# Patient Record
Sex: Female | Born: 1940 | Race: White | Hispanic: No | Smoking: Current some day smoker
Health system: Southern US, Community
[De-identification: ages and names within clinical notes are randomized; demographics above are authoritative.]

## PROBLEM LIST (undated history)

## (undated) DIAGNOSIS — C801 Malignant (primary) neoplasm, unspecified: Secondary | ICD-10-CM

## (undated) DIAGNOSIS — Z87891 Personal history of nicotine dependence: Secondary | ICD-10-CM

## (undated) DIAGNOSIS — L089 Local infection of the skin and subcutaneous tissue, unspecified: Secondary | ICD-10-CM

## (undated) DIAGNOSIS — C50919 Malignant neoplasm of unspecified site of unspecified female breast: Secondary | ICD-10-CM

## (undated) DIAGNOSIS — N289 Disorder of kidney and ureter, unspecified: Secondary | ICD-10-CM

## (undated) DIAGNOSIS — I10 Essential (primary) hypertension: Secondary | ICD-10-CM

## (undated) DIAGNOSIS — I639 Cerebral infarction, unspecified: Secondary | ICD-10-CM

## (undated) DIAGNOSIS — I6529 Occlusion and stenosis of unspecified carotid artery: Secondary | ICD-10-CM

## (undated) HISTORY — DX: Occlusion and stenosis of unspecified carotid artery: I65.29

## (undated) HISTORY — PX: BREAST LUMPECTOMY: SHX2

## (undated) HISTORY — DX: Cerebral infarction, unspecified: I63.9

## (undated) HISTORY — DX: Local infection of the skin and subcutaneous tissue, unspecified: L08.9

## (undated) HISTORY — PX: BREAST SURGERY: SHX581

## (undated) HISTORY — DX: Malignant (primary) neoplasm, unspecified: C80.1

## (undated) HISTORY — PX: BLADDER REPAIR: SHX76

## (undated) HISTORY — DX: Malignant neoplasm of unspecified site of unspecified female breast: C50.919

## (undated) HISTORY — DX: Disorder of kidney and ureter, unspecified: N28.9

## (undated) HISTORY — PX: ABDOMINAL HYSTERECTOMY: SHX81

## (undated) HISTORY — DX: Essential (primary) hypertension: I10

## (undated) HISTORY — DX: Personal history of nicotine dependence: Z87.891

## (undated) NOTE — *Deleted (*Deleted)
Transition of Care [facility]) - Progression Note    Patient Details  Name: Cathy Solis MRN: [id number] Date of Birth: Aug 14, 1941  Transition of Care [facility]) CM/SW Contact  Maree Krabbe, LCSW Phone Number: 10/01/2020, 12:55 PM  Clinical Narrative:       Expected Discharge Plan: Skilled Nursing Facility Barriers to Discharge: Continued Medical Work up  Expected Discharge Plan and Services Expected Discharge Plan: Skilled Nursing Facility       Living arrangements for the past 2 months: Single Family Home                                       Social Determinants of Health (SDOH) Interventions    Readmission Risk Interventions No flowsheet data found.

---
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
## 2004-10-07 ENCOUNTER — Ambulatory Visit: Payer: Self-pay | Admitting: Internal Medicine

## 2004-11-21 DIAGNOSIS — C50919 Malignant neoplasm of unspecified site of unspecified female breast: Secondary | ICD-10-CM

## 2004-11-21 DIAGNOSIS — C801 Malignant (primary) neoplasm, unspecified: Secondary | ICD-10-CM

## 2004-11-21 HISTORY — DX: Malignant neoplasm of unspecified site of unspecified female breast: C50.919

## 2004-11-21 HISTORY — DX: Malignant (primary) neoplasm, unspecified: C80.1

## 2005-08-18 ENCOUNTER — Ambulatory Visit: Payer: Self-pay | Admitting: Internal Medicine

## 2005-08-25 ENCOUNTER — Ambulatory Visit: Payer: Self-pay | Admitting: Internal Medicine

## 2005-09-16 ENCOUNTER — Ambulatory Visit: Payer: Self-pay | Admitting: Internal Medicine

## 2005-09-20 ENCOUNTER — Other Ambulatory Visit: Payer: Self-pay

## 2005-09-26 ENCOUNTER — Ambulatory Visit: Payer: Self-pay | Admitting: Surgery

## 2005-10-05 ENCOUNTER — Ambulatory Visit: Payer: Self-pay | Admitting: Urology

## 2005-10-05 ENCOUNTER — Ambulatory Visit: Payer: Self-pay | Admitting: Surgery

## 2005-10-10 ENCOUNTER — Ambulatory Visit: Payer: Self-pay | Admitting: Urology

## 2005-10-12 ENCOUNTER — Ambulatory Visit: Payer: Self-pay | Admitting: Oncology

## 2005-10-27 ENCOUNTER — Ambulatory Visit: Payer: Self-pay | Admitting: Surgery

## 2005-11-22 ENCOUNTER — Ambulatory Visit: Payer: Self-pay | Admitting: Oncology

## 2005-12-22 ENCOUNTER — Ambulatory Visit: Payer: Self-pay | Admitting: Oncology

## 2006-01-19 ENCOUNTER — Ambulatory Visit: Payer: Self-pay | Admitting: Oncology

## 2006-02-19 ENCOUNTER — Ambulatory Visit: Payer: Self-pay | Admitting: Oncology

## 2006-05-15 ENCOUNTER — Ambulatory Visit: Payer: Self-pay | Admitting: Oncology

## 2006-05-29 ENCOUNTER — Ambulatory Visit: Payer: Self-pay | Admitting: Oncology

## 2006-09-11 ENCOUNTER — Ambulatory Visit: Payer: Self-pay | Admitting: Oncology

## 2006-09-25 ENCOUNTER — Ambulatory Visit: Payer: Self-pay | Admitting: Gastroenterology

## 2006-09-27 ENCOUNTER — Ambulatory Visit: Payer: Self-pay | Admitting: Oncology

## 2006-10-21 ENCOUNTER — Ambulatory Visit: Payer: Self-pay | Admitting: Oncology

## 2006-10-31 ENCOUNTER — Ambulatory Visit: Payer: Self-pay | Admitting: *Deleted

## 2006-11-17 ENCOUNTER — Other Ambulatory Visit: Payer: Self-pay

## 2006-11-21 HISTORY — PX: CAROTID ENDARTERECTOMY: SUR193

## 2006-11-21 HISTORY — PX: COLONOSCOPY: SHX174

## 2006-11-29 ENCOUNTER — Inpatient Hospital Stay: Payer: Self-pay | Admitting: General Surgery

## 2006-12-24 ENCOUNTER — Emergency Department: Payer: Self-pay | Admitting: Emergency Medicine

## 2007-01-20 ENCOUNTER — Ambulatory Visit: Payer: Self-pay | Admitting: Oncology

## 2007-02-05 ENCOUNTER — Ambulatory Visit: Payer: Self-pay | Admitting: Oncology

## 2007-02-20 ENCOUNTER — Ambulatory Visit: Payer: Self-pay | Admitting: Oncology

## 2007-05-22 ENCOUNTER — Ambulatory Visit: Payer: Self-pay | Admitting: Oncology

## 2007-06-06 ENCOUNTER — Ambulatory Visit: Payer: Self-pay | Admitting: Oncology

## 2007-06-22 ENCOUNTER — Ambulatory Visit: Payer: Self-pay | Admitting: Oncology

## 2007-07-31 ENCOUNTER — Emergency Department: Payer: Self-pay | Admitting: Emergency Medicine

## 2007-09-14 ENCOUNTER — Ambulatory Visit: Payer: Self-pay | Admitting: Oncology

## 2007-11-22 ENCOUNTER — Ambulatory Visit: Payer: Self-pay | Admitting: Oncology

## 2007-12-06 ENCOUNTER — Ambulatory Visit: Payer: Self-pay | Admitting: Oncology

## 2007-12-23 ENCOUNTER — Ambulatory Visit: Payer: Self-pay | Admitting: Oncology

## 2008-05-21 ENCOUNTER — Ambulatory Visit: Payer: Self-pay | Admitting: Oncology

## 2008-06-05 ENCOUNTER — Ambulatory Visit: Payer: Self-pay | Admitting: Oncology

## 2008-06-21 ENCOUNTER — Ambulatory Visit: Payer: Self-pay | Admitting: Oncology

## 2008-10-28 ENCOUNTER — Ambulatory Visit: Payer: Self-pay | Admitting: Oncology

## 2008-11-21 ENCOUNTER — Ambulatory Visit: Payer: Self-pay | Admitting: Oncology

## 2008-12-04 ENCOUNTER — Ambulatory Visit: Payer: Self-pay | Admitting: Oncology

## 2008-12-22 ENCOUNTER — Ambulatory Visit: Payer: Self-pay | Admitting: Oncology

## 2009-05-21 ENCOUNTER — Ambulatory Visit: Payer: Self-pay | Admitting: Oncology

## 2009-06-18 ENCOUNTER — Ambulatory Visit: Payer: Self-pay | Admitting: Oncology

## 2009-06-21 ENCOUNTER — Ambulatory Visit: Payer: Self-pay | Admitting: Oncology

## 2009-07-22 ENCOUNTER — Ambulatory Visit: Payer: Self-pay | Admitting: Oncology

## 2009-07-23 ENCOUNTER — Ambulatory Visit: Payer: Self-pay | Admitting: Internal Medicine

## 2009-12-22 ENCOUNTER — Ambulatory Visit: Payer: Self-pay | Admitting: Oncology

## 2010-01-14 ENCOUNTER — Ambulatory Visit: Payer: Self-pay | Admitting: Oncology

## 2010-01-19 ENCOUNTER — Ambulatory Visit: Payer: Self-pay | Admitting: Oncology

## 2010-02-11 ENCOUNTER — Ambulatory Visit: Payer: Self-pay | Admitting: Oncology

## 2010-06-21 ENCOUNTER — Ambulatory Visit: Payer: Self-pay | Admitting: Oncology

## 2010-07-14 ENCOUNTER — Ambulatory Visit: Payer: Self-pay | Admitting: Oncology

## 2010-07-15 LAB — CANCER ANTIGEN 27.29: CA 27.29: 29.4 U/mL (ref 0.0–38.6)

## 2010-07-22 ENCOUNTER — Ambulatory Visit: Payer: Self-pay | Admitting: Oncology

## 2010-09-14 ENCOUNTER — Ambulatory Visit: Payer: Self-pay | Admitting: Nephrology

## 2010-11-02 LAB — HM MAMMOGRAPHY: HM Mammogram: NORMAL

## 2011-02-15 ENCOUNTER — Ambulatory Visit: Payer: Self-pay | Admitting: Oncology

## 2011-07-28 ENCOUNTER — Other Ambulatory Visit (INDEPENDENT_AMBULATORY_CARE_PROVIDER_SITE_OTHER): Payer: PRIVATE HEALTH INSURANCE | Admitting: *Deleted

## 2011-07-28 ENCOUNTER — Encounter: Payer: Self-pay | Admitting: Internal Medicine

## 2011-07-28 DIAGNOSIS — Z0000 Encounter for general adult medical examination without abnormal findings: Secondary | ICD-10-CM

## 2011-07-28 LAB — CBC WITH DIFFERENTIAL/PLATELET
Basophils Absolute: 0 10*3/uL (ref 0.0–0.1)
Basophils Relative: 0.5 % (ref 0.0–3.0)
Eosinophils Absolute: 0.1 10*3/uL (ref 0.0–0.7)
Eosinophils Relative: 2 % (ref 0.0–5.0)
HCT: 42.3 % (ref 36.0–46.0)
Hemoglobin: 14.2 g/dL (ref 12.0–15.0)
Lymphocytes Relative: 24.1 % (ref 12.0–46.0)
Lymphs Abs: 1.6 10*3/uL (ref 0.7–4.0)
MCHC: 33.6 g/dL (ref 30.0–36.0)
MCV: 97.4 fl (ref 78.0–100.0)
Monocytes Absolute: 0.7 10*3/uL (ref 0.1–1.0)
Monocytes Relative: 11.1 % (ref 3.0–12.0)
Neutro Abs: 4.1 10*3/uL (ref 1.4–7.7)
Neutrophils Relative %: 62.3 % (ref 43.0–77.0)
Platelets: 215 10*3/uL (ref 150.0–400.0)
RBC: 4.34 Mil/uL (ref 3.87–5.11)
RDW: 13.3 % (ref 11.5–14.6)
WBC: 6.5 10*3/uL (ref 4.5–10.5)

## 2011-07-28 LAB — LIPID PANEL
Cholesterol: 165 mg/dL (ref 0–200)
HDL: 57.4 mg/dL (ref >39.00)
LDL Cholesterol: 86 mg/dL (ref 0–99)
Total CHOL/HDL Ratio: 3
Triglycerides: 110 mg/dL (ref 0.0–149.0)
VLDL: 22 mg/dL (ref 0.0–40.0)

## 2011-07-28 LAB — BASIC METABOLIC PANEL
BUN: 13 mg/dL (ref 6–23)
CO2: 26 mEq/L (ref 19–32)
Calcium: 9.2 mg/dL (ref 8.4–10.5)
Chloride: 103 mEq/L (ref 96–112)
Creatinine, Ser: 0.8 mg/dL (ref 0.4–1.2)
GFR: 78.18 mL/min (ref >60.00)
Glucose, Bld: 89 mg/dL (ref 70–99)
Potassium: 3.7 mEq/L (ref 3.5–5.1)
Sodium: 137 mEq/L (ref 135–145)

## 2011-07-28 LAB — HEPATIC FUNCTION PANEL
ALT: 15 U/L (ref 0–35)
AST: 21 U/L (ref 0–37)
Albumin: 3.9 g/dL (ref 3.5–5.2)
Alkaline Phosphatase: 60 U/L (ref 39–117)
Bilirubin, Direct: 0.1 mg/dL (ref 0.0–0.3)
Total Bilirubin: 0.5 mg/dL (ref 0.3–1.2)
Total Protein: 7.7 g/dL (ref 6.0–8.3)

## 2011-08-04 ENCOUNTER — Ambulatory Visit (INDEPENDENT_AMBULATORY_CARE_PROVIDER_SITE_OTHER): Payer: PRIVATE HEALTH INSURANCE | Admitting: Internal Medicine

## 2011-08-04 ENCOUNTER — Encounter: Payer: Self-pay | Admitting: Internal Medicine

## 2011-08-04 VITALS — BP 148/78 | HR 68 | Temp 97.8°F | Resp 24 | Ht 65.5 in | Wt 131.2 lb

## 2011-08-04 DIAGNOSIS — R531 Weakness: Secondary | ICD-10-CM

## 2011-08-04 DIAGNOSIS — I129 Hypertensive chronic kidney disease with stage 1 through stage 4 chronic kidney disease, or unspecified chronic kidney disease: Secondary | ICD-10-CM | POA: Insufficient documentation

## 2011-08-04 DIAGNOSIS — R5383 Other fatigue: Secondary | ICD-10-CM

## 2011-08-04 DIAGNOSIS — E039 Hypothyroidism, unspecified: Secondary | ICD-10-CM | POA: Insufficient documentation

## 2011-08-04 DIAGNOSIS — I10 Essential (primary) hypertension: Secondary | ICD-10-CM

## 2011-08-04 DIAGNOSIS — R5381 Other malaise: Secondary | ICD-10-CM

## 2011-08-04 LAB — TSH: TSH: 0.52 u[IU]/mL (ref 0.35–5.50)

## 2011-08-04 LAB — VITAMIN B12: Vitamin B-12: 362 pg/mL (ref 211–911)

## 2011-08-04 LAB — MICROALBUMIN / CREATININE URINE RATIO
Creatinine,U: 30.1 mg/dL
Microalb Creat Ratio: 68 mg/g — ABNORMAL HIGH (ref 0.0–30.0)
Microalb, Ur: 20.5 mg/dL — ABNORMAL HIGH (ref 0.0–1.9)

## 2011-08-04 NOTE — Patient Instructions (Addendum)
Labs today. Follow up in 1 month.  Smoking Cessation This document explains the best ways for you to quit smoking and new treatments to help. It lists new medicines that can double or triple your chances of quitting and quitting for good. It also considers ways to avoid relapses and concerns you may have about quitting, including weight gain. NICOTINE: A POWERFUL ADDICTION If you have tried to quit smoking, you know how hard it can be. It is hard because nicotine is a very addictive drug. For some people, it can be as addictive as heroin or cocaine. Usually, people make 2 or 3 tries, or more, before finally being able to quit. Each time you try to quit, you can learn about what helps and what hurts. Quitting takes hard work and a lot of effort, but you can quit smoking. QUITTING SMOKING IS ONE OF THE MOST IMPORTANT THINGS YOU WILL EVER DO:  You will live longer, feel better, and live better.   The impact on your body of quitting smoking is felt almost immediately:   Within 20 minutes, blood pressure decreases. Pulse returns to its normal level.   After 8 hours, carbon monoxide levels in the blood return to normal. Oxygen level increases.   After 24 hours, chance of heart attack starts to decrease. Breath, hair, and body stop smelling like smoke.   After 48 hours, damaged nerve endings begin to recover. Sense of taste and smell improve.   After 72 hours, the body is virtually free of nicotine. Bronchial tubes relax and breathing becomes easier.   After 2 to 12 weeks, lungs can hold more air. Exercise becomes easier and circulation improves.   Quitting will lower your chance of having a heart attack, stroke, cancer, or lung disease:   After 1 year, the risk of coronary heart disease is cut in half.   After 5 years, the risk of stroke falls to the same as a nonsmoker.   After 10 years, the risk of lung cancer is cut in half and the risk of other cancers decreases significantly.   After  15 years, the risk of coronary heart disease drops, usually to the level of a nonsmoker.   If you are pregnant, quitting smoking will improve your chances of having a healthy baby.   The people you live with, especially your children, will be healthier.   You will have extra money to spend on things other than cigarettes.  FIVE KEYS TO QUITTING Studies have shown that these 5 steps will help you quit smoking and quit for good. You have the best chances of quitting if you use them together: 1. Get ready.  2. Get support and encouragement.  3. Learn new skills and behaviors.  4. Get medicine to reduce your nicotine addiction and use it correctly.  5. Be prepared for relapse or difficult situations. Be determined to continue trying to quit, even if you do not succeed at first.  1. GET READY  Set a quit date.   Change your environment.   Get rid of ALL cigarettes, ashtrays, matches, and lighters in your home, car, and place of work.   Do not let people smoke in your home.   Review your past attempts to quit. Think about what worked and what did not.   Once you quit, do not smoke. NOT EVEN A PUFF!  2. GET SUPPORT AND ENCOURAGEMENT Studies have shown that you have a better chance of being successful if you have help. You  can get support in many ways.  Tell your family, friends, and coworkers that you are going to quit and need their support. Ask them not to smoke around you.   Talk to your caregivers (doctor, dentist, nurse, pharmacist, psychologist, and/or smoking counselor).   Get individual, group, or telephone counseling and support. The more counseling you have, the better your chances are of quitting. Programs are available at [employer] and health centers. Call your local health department for information about programs in your area.   Spiritual beliefs and practices may help some smokers quit.   Quit meters are Photographer that keep  track of quit statistics, such as amount of "quit-time," cigarettes not smoked, and money saved.   Many smokers find one or more of the many self-help books available useful in helping them quit and stay off tobacco.  3. LEARN NEW SKILLS AND BEHAVIORS  Try to distract yourself from urges to smoke. Talk to someone, go for a walk, or occupy your time with a task.   When you first try to quit, change your routine. Take a different route to work. Drink tea instead of coffee. Eat breakfast in a different place.   Do something to reduce your stress. Take a hot bath, exercise, or read a book.   Plan something enjoyable to do every day. Reward yourself for not smoking.   Explore interactive web-based programs that specialize in helping you quit.  4. GET MEDICINE AND USE IT CORRECTLY Medicines can help you stop smoking and decrease the urge to smoke. Combining medicine with the above behavioral methods and support can quadruple your chances of successfully quitting smoking. The [place] Food and Drug Administration (FDA) has approved 7 medicines to help you quit smoking. These medicines fall into 3 categories.  Nicotine replacement therapy (delivers nicotine to your body without the negative effects and risks of smoking):   Nicotine gum: Available over-the-counter.   Nicotine lozenges: Available over-the-counter.   Nicotine inhaler: Available by prescription.   Nicotine nasal spray: Available by prescription.   Nicotine skin patches (transdermal): Available by prescription and over-the-counter.   Antidepressant medicine (helps people abstain from smoking, but how this works is unknown):   Bupropion sustained-release (SR) tablets: Available by prescription.   Nicotinic receptor partial agonist (simulates the effect of nicotine in your brain):   Varenicline tartrate tablets: Available by prescription.   Ask your caregiver for advice about which medicines to use and how to use them. Carefully  read the information on the package.   Everyone who is trying to quit may benefit from using a medicine. If you are pregnant or trying to become pregnant, nursing an infant, you are under age 52, or you smoke fewer than 10 cigarettes per day, talk to your caregiver before taking any nicotine replacement medicines.   You should stop using a nicotine replacement product and call your caregiver if you experience nausea, dizziness, weakness, vomiting, fast or irregular heartbeat, mouth problems with the lozenge or gum, or redness or swelling of the skin around the patch that does not go away.   Do not use any other product containing nicotine while using a nicotine replacement product.   Talk to your caregiver before using these products if you have diabetes, heart disease, asthma, stomach ulcers, you had a recent heart attack, you have high blood pressure that is not controlled with medicine, a history of irregular heartbeat, or you have been prescribed medicine to help you quit  smoking.  5. BE PREPARED FOR RELAPSE OR DIFFICULT SITUATIONS  Most relapses occur within the first 3 months after quitting. Do not be discouraged if you start smoking again. Remember, most people try several times before they finally quit.   You may have symptoms of withdrawal because your body is used to nicotine. You may crave cigarettes, be irritable, feel very hungry, cough often, get headaches, or have difficulty concentrating.   The withdrawal symptoms are only temporary. They are strongest when you first quit, but they will go away within 10 to 14 days.  Here are some difficult situations to watch for:  Alcohol. Avoid drinking alcohol. Drinking lowers your chances of successfully quitting.   Caffeine. Try to reduce the amount of caffeine you consume. It also lowers your chances of successfully quitting.   Other smokers. Being around smoking can make you want to smoke. Avoid smokers.   Weight gain. Many smokers  will gain weight when they quit, usually less than 10 pounds. Eat a healthy diet and stay active. Do not let weight gain distract you from your main goal, quitting smoking. Some medicines that help you quit smoking may also help delay weight gain. You can always lose the weight gained after you quit.   Bad mood or depression. There are a lot of ways to improve your mood other than smoking.  If you are having problems with any of these situations, talk to your caregiver. SPECIAL SITUATIONS OR CONDITIONS Studies suggest that everyone can quit smoking. Your situation or condition can give you a special reason to quit.  Pregnant women/New mothers: By quitting, you protect your baby's health and your own.   Hospitalized patients: By quitting, you reduce health problems and help healing.   Heart attack patients: By quitting, you reduce your risk of a second heart attack.   Lung, head, and neck cancer patients: By quitting, you reduce your chance of a second cancer.   Parents of children and adolescents: By quitting, you protect your children from illnesses caused by secondhand smoke.  QUESTIONS TO THINK ABOUT Think about the following questions before you try to stop smoking. You may want to talk about your answers with your caregiver.  Why do you want to quit?   If you tried to quit in the past, what helped and what did not?   What will be the most difficult situations for you after you quit? How will you plan to handle them?   Who can help you through the tough times? Your family? Friends? Caregiver?   What pleasures do you get from smoking? What ways can you still get pleasure if you quit?  Here are some questions to ask your caregiver:  How can you help me to be successful at quitting?   What medicine do you think would be best for me and how should I take it?   What should I do if I need more help?   What is smoking withdrawal like? How can I get information on withdrawal?    Quitting takes hard work and a lot of effort, but you can quit smoking. FOR MORE INFORMATION Smokefree.gov (http://www.davis-sullivan.com/) provides free, accurate, evidence-based information and professional assistance to help support the immediate and long-term needs of people trying to quit smoking. Document Released: 11/01/2001 Document Re-Released: 04/27/2010 [facility] Patient Information 2011 [place], [place].

## 2011-08-04 NOTE — Progress Notes (Signed)
Subjective:    Patient ID: Cathy Solis, female    DOB: 01-Apr-1941, 70 y.o.   MRN: [id number]  HPI Cathy Solis is a 69 year old female with a history of hypertension and hypothyroidism who presents for followup. She complains of significant fatigue and diffuse weakness over the last several months. She denies any focal symptoms. She denies any nausea, vomiting, diarrhea, blood in stool, pain, change in appetite, fever. She denies any chest pain, palpitations, shortness of breath. She continues to work at the [employer] and often has to lift boxes which are near 50 pounds. She has some difficulty in performing her job duties because of her diffuse weakness. She has not had any falls. She has not noted any weight loss. She reports well rounded diet.  Outpatient Encounter Prescriptions as of 08/04/2011  Medication Sig Dispense Refill  . AmLODIPine Besylate (NORVASC PO) Take 1 tablet by mouth daily.        [name] aspirin 325 MG tablet Take 325 mg by mouth daily.        . Calcium Carbonate (CALCIUM 600 PO) Take 1 tablet by mouth daily.        [name] levothyroxine (SYNTHROID, LEVOTHROID) 125 MCG tablet Take 125 mcg by mouth daily.        . metoprolol tartrate (LOPRESSOR) 25 MG tablet Take 25 mg by mouth 2 (two) times daily.        . Rosuvastatin Calcium (CRESTOR PO) Take 1 tablet by mouth daily.          Review of Systems  Constitutional: Positive for fatigue. Negative for fever, chills, appetite change and unexpected weight change.  HENT: Negative for ear pain, congestion, sore throat, trouble swallowing, neck pain, voice change and sinus pressure.   Eyes: Negative for visual disturbance.  Respiratory: Negative for cough, shortness of breath, wheezing and stridor.   Cardiovascular: Negative for chest pain, palpitations and leg swelling.  Gastrointestinal: Negative for nausea, vomiting, abdominal pain, diarrhea, constipation, blood in stool, abdominal distention and anal bleeding.  Genitourinary:  Negative for dysuria and flank pain.  Musculoskeletal: Negative for myalgias, arthralgias and gait problem.  Skin: Negative for color change and rash.  Neurological: Positive for weakness. Negative for dizziness, tremors, seizures, facial asymmetry, light-headedness, numbness and headaches.  Hematological: Negative for adenopathy. Does not bruise/bleed easily.  Psychiatric/Behavioral: Negative for suicidal ideas, sleep disturbance and dysphoric mood. The patient is not nervous/anxious.    BP 148/78  Pulse 68  Temp(Src) 97.8 F (36.6 C) (Oral)  Resp 24  Ht 5' 5.5" (1.664 m)  Wt 131 lb 4 oz (59.535 kg)  BMI 21.51 kg/m2  SpO2 97%     Objective:   Physical Exam  Constitutional: She is oriented to person, place, and time. She appears well-developed and well-nourished. No distress.  HENT:  Head: Normocephalic and atraumatic.  Right Ear: External ear normal.  Left Ear: External ear normal.  Nose: Nose normal.  Mouth/Throat: Oropharynx is clear and moist. No oropharyngeal exudate.  Eyes: Conjunctivae are normal. Pupils are equal, round, and reactive to light. Right eye exhibits no discharge. Left eye exhibits no discharge. No scleral icterus.  Neck: Normal range of motion. Neck supple. No tracheal deviation present. No thyromegaly present.  Cardiovascular: Normal rate, regular rhythm, normal heart sounds and intact distal pulses.  Exam reveals no gallop and no friction rub.   No murmur heard. Pulmonary/Chest: Effort normal and breath sounds normal. No respiratory distress. She has no wheezes. She has no rales. She exhibits no  tenderness. Right breast exhibits no inverted nipple, no mass, no nipple discharge, no skin change and no tenderness. Left breast exhibits no inverted nipple, no mass, no nipple discharge, no skin change and no tenderness. Breasts are symmetrical.  Abdominal: Soft. Normal appearance and bowel sounds are normal. There is no hepatosplenomegaly. There is no tenderness.    Musculoskeletal: Normal range of motion. She exhibits no edema and no tenderness.  Lymphadenopathy:    She has no cervical adenopathy.  Neurological: She is alert and oriented to person, place, and time. No cranial nerve deficit. She exhibits normal muscle tone. Coordination normal.  Skin: Skin is warm and dry. No rash noted. She is not diaphoretic. No erythema. No pallor.  Psychiatric: She has a normal mood and affect. Her behavior is normal. Judgment and thought content normal.          Assessment & Plan:  1. Fatigue and weakness - patient with a recent history of fatigue and weakness. She has no focal symptoms and exam is normal today. 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 We will check for thyroid dysfunction and B12 deficiency with lab work today. Her other recent lab work including blood counts, renal function, liver function were all normal. She is up-to-date on her colonoscopy. She is up-to-date on her mammogram. We will call her with lab reports and plan to see her back in one month.  2. Hypothyroidism - Will check TSH with labs today. Continue Synthroid.  3. Hypertension - Renal function normal on recent check. SBP slightly elevated today. Will continue current meds and recheck BP in 1 month.

## 2011-08-31 ENCOUNTER — Encounter: Payer: Self-pay | Admitting: Internal Medicine

## 2011-09-22 ENCOUNTER — Other Ambulatory Visit: Payer: Self-pay | Admitting: Internal Medicine

## 2011-09-22 MED ORDER — ROSUVASTATIN CALCIUM 10 MG PO TABS: 10.0000 mg | ORAL_TABLET | Freq: Every day | ORAL | Status: DC

## 2011-09-27 ENCOUNTER — Other Ambulatory Visit: Payer: Self-pay | Admitting: *Deleted

## 2011-09-27 MED ORDER — LEVOTHYROXINE SODIUM 125 MCG PO TABS: 125.0000 ug | ORAL_TABLET | Freq: Every day | ORAL | Status: DC

## 2011-09-27 MED ORDER — METOPROLOL TARTRATE 25 MG PO TABS: 25.0000 mg | ORAL_TABLET | Freq: Two times a day (BID) | ORAL | Status: DC

## 2011-10-21 ENCOUNTER — Telehealth: Payer: Self-pay | Admitting: Internal Medicine

## 2011-10-21 MED ORDER — AMLODIPINE BESYLATE 10 MG PO TABS: 10.0000 mg | ORAL_TABLET | Freq: Every day | ORAL | Status: DC

## 2011-10-21 NOTE — Telephone Encounter (Signed)
Pt needs RF, done, Patient informed

## 2011-11-22 HISTORY — PX: EYE SURGERY: SHX253

## 2011-12-21 ENCOUNTER — Ambulatory Visit: Payer: Self-pay | Admitting: Ophthalmology

## 2011-12-26 ENCOUNTER — Other Ambulatory Visit: Payer: PRIVATE HEALTH INSURANCE

## 2011-12-29 ENCOUNTER — Ambulatory Visit (INDEPENDENT_AMBULATORY_CARE_PROVIDER_SITE_OTHER): Payer: PRIVATE HEALTH INSURANCE | Admitting: Internal Medicine

## 2011-12-29 ENCOUNTER — Encounter: Payer: Self-pay | Admitting: Internal Medicine

## 2011-12-29 VITALS — BP 110/70 | HR 61 | Temp 98.1°F | Ht 64.5 in | Wt 133.0 lb

## 2011-12-29 DIAGNOSIS — R809 Proteinuria, unspecified: Secondary | ICD-10-CM

## 2011-12-29 DIAGNOSIS — I10 Essential (primary) hypertension: Secondary | ICD-10-CM

## 2011-12-29 DIAGNOSIS — E785 Hyperlipidemia, unspecified: Secondary | ICD-10-CM | POA: Insufficient documentation

## 2011-12-29 DIAGNOSIS — E039 Hypothyroidism, unspecified: Secondary | ICD-10-CM

## 2011-12-29 DIAGNOSIS — Z1231 Encounter for screening mammogram for malignant neoplasm of breast: Secondary | ICD-10-CM

## 2011-12-29 LAB — COMPREHENSIVE METABOLIC PANEL
ALT: 19 U/L (ref 0–35)
AST: 23 U/L (ref 0–37)
Albumin: 3.6 g/dL (ref 3.5–5.2)
Alkaline Phosphatase: 65 U/L (ref 39–117)
BUN: 24 mg/dL — ABNORMAL HIGH (ref 6–23)
CO2: 28 mEq/L (ref 19–32)
Calcium: 9.5 mg/dL (ref 8.4–10.5)
Chloride: 104 mEq/L (ref 96–112)
Creatinine, Ser: 0.9 mg/dL (ref 0.4–1.2)
GFR: 62.03 mL/min (ref >60.00)
Glucose, Bld: 87 mg/dL (ref 70–99)
Potassium: 3.9 mEq/L (ref 3.5–5.1)
Sodium: 137 mEq/L (ref 135–145)
Total Bilirubin: 0.5 mg/dL (ref 0.3–1.2)
Total Protein: 7.2 g/dL (ref 6.0–8.3)

## 2011-12-29 LAB — TSH: TSH: 0.9 u[IU]/mL (ref 0.35–5.50)

## 2011-12-29 LAB — MICROALBUMIN / CREATININE URINE RATIO
Creatinine,U: 60.7 mg/dL
Microalb Creat Ratio: 48.3 mg/g — ABNORMAL HIGH (ref 0.0–30.0)
Microalb, Ur: 29.3 mg/dL — ABNORMAL HIGH (ref 0.0–1.9)

## 2011-12-29 MED ORDER — AMLODIPINE BESYLATE 10 MG PO TABS: 10.0000 mg | ORAL_TABLET | Freq: Every day | ORAL | Status: DC

## 2011-12-29 MED ORDER — ROSUVASTATIN CALCIUM 10 MG PO TABS: 10.0000 mg | ORAL_TABLET | Freq: Every day | ORAL | Status: DC

## 2011-12-29 MED ORDER — LEVOTHYROXINE SODIUM 125 MCG PO TABS: 125.0000 ug | ORAL_TABLET | Freq: Every day | ORAL | Status: DC

## 2011-12-29 MED ORDER — METOPROLOL TARTRATE 25 MG PO TABS: 25.0000 mg | ORAL_TABLET | Freq: Two times a day (BID) | ORAL | Status: DC

## 2011-12-29 NOTE — Assessment & Plan Note (Signed)
Patient noted to have microalbuminuria on recent labs. Will repeat today.

## 2011-12-29 NOTE — Progress Notes (Signed)
Subjective:    Patient ID: Cathy Solis, female    DOB: [birth year], 71 y.o.   MRN: [id number]  HPI 71 year old female with history of hypertension, hyperlipidemia, and hypothyroidism presents for followup. At her last visit, she was concerned about some fatigue. She notes that this has completely resolved. In regards to her hypertension, she reports good control of her blood pressure. She reports full compliance with her medication. She denies any headache, palpitations, chest pain. In regards to her hyperlipidemia, she reports full compliance with her Crestor. She denies any myalgia. In regards to hypothyroidism, she denies any fatigue,  temperature intolerance, or other complaints. She reports that she is generally feeling well.  Outpatient Encounter Prescriptions as of 12/29/2011  Medication Sig Dispense Refill  . amLODipine (NORVASC) 10 MG tablet Take 1 tablet (10 mg total) by mouth daily.  90 tablet  4  . aspirin 325 MG tablet Take 325 mg by mouth daily.        . Calcium Carbonate (CALCIUM 600 PO) Take 1 tablet by mouth daily.        [name] doxycycline (VIBRAMYCIN) 100 MG capsule Take 100 mg by mouth 2 (two) times daily.      [name] levothyroxine (SYNTHROID, LEVOTHROID) 125 MCG tablet Take 1 tablet (125 mcg total) by mouth daily.  90 tablet  4  . metoprolol tartrate (LOPRESSOR) 25 MG tablet Take 1 tablet (25 mg total) by mouth 2 (two) times daily.  90 tablet  4  . rosuvastatin (CRESTOR) 10 MG tablet Take 1 tablet (10 mg total) by mouth daily.  90 tablet  4    Review of Systems  Constitutional: Negative for fever, chills, appetite change, fatigue and unexpected weight change.  HENT: Negative for ear pain, congestion, sore throat, trouble swallowing, neck pain, voice change and sinus pressure.   Eyes: Negative for visual disturbance.  Respiratory: Negative for cough, shortness of breath, wheezing and stridor.   Cardiovascular: Negative for chest pain, palpitations and leg swelling.  Gastrointestinal:  Negative for nausea, vomiting, abdominal pain, diarrhea, constipation, blood in stool, abdominal distention and anal bleeding.  Genitourinary: Negative for dysuria and flank pain.  Musculoskeletal: Negative for myalgias, arthralgias and gait problem.  Skin: Negative for color change and rash.  Neurological: Negative for dizziness and headaches.  Hematological: Negative for adenopathy. Does not bruise/bleed easily.  Psychiatric/Behavioral: Negative for suicidal ideas, sleep disturbance and dysphoric mood. The patient is not nervous/anxious.    BP 110/70  Pulse 61  Temp(Src) 98.1 F (36.7 C) (Oral)  Ht 5' 4.5" (1.638 m)  Wt 133 lb (60.328 kg)  BMI 22.48 kg/m2  SpO2 96%     Objective:   Physical Exam  Constitutional: She is oriented to person, place, and time. She appears well-developed and well-nourished. No distress.  HENT:  Head: Normocephalic and atraumatic.  Right Ear: External ear normal.  Left Ear: External ear normal.  Nose: Nose normal.  Mouth/Throat: Oropharynx is clear and moist. No oropharyngeal exudate.  Eyes: Conjunctivae are normal. Pupils are equal, round, and reactive to light. Right eye exhibits no discharge. Left eye exhibits no discharge. No scleral icterus.  Neck: Normal range of motion. Neck supple. No tracheal deviation present. No thyromegaly present.  Cardiovascular: Normal rate, regular rhythm, normal heart sounds and intact distal pulses.  Exam reveals no gallop and no friction rub.   No murmur heard. Pulmonary/Chest: Effort normal and breath sounds normal. No respiratory distress. She has no wheezes. She has no rales. She exhibits no tenderness.  Musculoskeletal: Normal range of motion. She exhibits no edema and no tenderness.  Lymphadenopathy:    She has no cervical adenopathy.  Neurological: She is alert and oriented to person, place, and time. No cranial nerve deficit. She exhibits normal muscle tone. Coordination normal.  Skin: Skin is warm and dry. No  rash noted. She is not diaphoretic. No erythema. No pallor.     Psychiatric: She has a normal mood and affect. Her behavior is normal. Judgment and thought content normal.          Assessment & Plan:

## 2011-12-29 NOTE — Assessment & Plan Note (Signed)
Patient with hyperlipidemia, on Crestor. Will check liver function tests with labs today. Followup in 6 months.

## 2011-12-29 NOTE — Assessment & Plan Note (Signed)
Blood pressure well-controlled on current medications. Proteinuria noted on last labs, will repeat today. If persistent, will start ACE inhibitor. Followup in 6 months.

## 2011-12-29 NOTE — Assessment & Plan Note (Signed)
Symptoms stable on Synthroid. Will check TSH with labs. Followup in 6 months.

## 2012-02-16 ENCOUNTER — Ambulatory Visit: Payer: Self-pay | Admitting: Internal Medicine

## 2012-02-24 ENCOUNTER — Encounter: Payer: Self-pay | Admitting: Internal Medicine

## 2012-04-02 ENCOUNTER — Ambulatory Visit: Payer: Self-pay | Admitting: Gastroenterology

## 2012-04-03 LAB — PATHOLOGY REPORT

## 2012-04-17 ENCOUNTER — Other Ambulatory Visit: Payer: Self-pay | Admitting: *Deleted

## 2012-04-17 DIAGNOSIS — I10 Essential (primary) hypertension: Secondary | ICD-10-CM

## 2012-04-17 MED ORDER — AMLODIPINE BESYLATE 10 MG PO TABS: 10.0000 mg | ORAL_TABLET | Freq: Every day | ORAL | Status: DC

## 2012-04-17 NOTE — Telephone Encounter (Signed)
Per pharmacy, never received Printed Rx script from 02.07.13 OV-gave VO for refill #[id number]

## 2012-04-18 ENCOUNTER — Encounter: Payer: Self-pay | Admitting: Internal Medicine

## 2012-05-17 ENCOUNTER — Other Ambulatory Visit: Payer: Self-pay | Admitting: *Deleted

## 2012-05-17 DIAGNOSIS — E785 Hyperlipidemia, unspecified: Secondary | ICD-10-CM

## 2012-05-17 DIAGNOSIS — I10 Essential (primary) hypertension: Secondary | ICD-10-CM

## 2012-05-17 MED ORDER — METOPROLOL TARTRATE 25 MG PO TABS: 25.0000 mg | ORAL_TABLET | Freq: Two times a day (BID) | ORAL | Status: DC

## 2012-05-17 MED ORDER — ROSUVASTATIN CALCIUM 10 MG PO TABS: 10.0000 mg | ORAL_TABLET | Freq: Every day | ORAL | Status: DC

## 2012-05-17 NOTE — Telephone Encounter (Signed)
Received fax refill request for Synthroid , this dose is not listed on med list please advise.

## 2012-05-17 NOTE — Telephone Encounter (Signed)
Fine to refill. Please just confirm with patient that she is taking this.

## 2012-05-17 NOTE — Telephone Encounter (Signed)
Left message on machine at home for patient to return call. 

## 2012-05-18 ENCOUNTER — Other Ambulatory Visit: Payer: Self-pay | Admitting: *Deleted

## 2012-05-18 MED ORDER — LEVOTHYROXINE SODIUM 175 MCG PO TABS: 175.0000 ug | ORAL_TABLET | Freq: Every day | ORAL | Status: DC

## 2012-05-18 NOTE — Telephone Encounter (Signed)
Spoke with patient via telephone and she stated that she doesn't know what dose of Synthroid she is on, she is at work and doesn't have the medication bottle with her.  I advised patient that we must verify what correct dose she is taking before we refill it.  She will call back later with dose of Synthroid.

## 2012-06-26 ENCOUNTER — Encounter: Payer: Self-pay | Admitting: Internal Medicine

## 2012-06-26 ENCOUNTER — Ambulatory Visit (INDEPENDENT_AMBULATORY_CARE_PROVIDER_SITE_OTHER): Payer: BC Managed Care – PPO | Admitting: Internal Medicine

## 2012-06-26 VITALS — BP 142/80 | HR 60 | Temp 98.0°F | Ht 64.5 in | Wt 132.5 lb

## 2012-06-26 DIAGNOSIS — R531 Weakness: Secondary | ICD-10-CM | POA: Insufficient documentation

## 2012-06-26 DIAGNOSIS — R809 Proteinuria, unspecified: Secondary | ICD-10-CM

## 2012-06-26 DIAGNOSIS — R5381 Other malaise: Secondary | ICD-10-CM

## 2012-06-26 DIAGNOSIS — E039 Hypothyroidism, unspecified: Secondary | ICD-10-CM

## 2012-06-26 DIAGNOSIS — D510 Vitamin B12 deficiency anemia due to intrinsic factor deficiency: Secondary | ICD-10-CM

## 2012-06-26 DIAGNOSIS — E785 Hyperlipidemia, unspecified: Secondary | ICD-10-CM

## 2012-06-26 DIAGNOSIS — R5383 Other fatigue: Secondary | ICD-10-CM

## 2012-06-26 DIAGNOSIS — E538 Deficiency of other specified B group vitamins: Secondary | ICD-10-CM

## 2012-06-26 DIAGNOSIS — I10 Essential (primary) hypertension: Secondary | ICD-10-CM

## 2012-06-26 DIAGNOSIS — Z23 Encounter for immunization: Secondary | ICD-10-CM

## 2012-06-26 LAB — COMPREHENSIVE METABOLIC PANEL
ALT: 16 U/L (ref 0–35)
AST: 21 U/L (ref 0–37)
Albumin: 3.7 g/dL (ref 3.5–5.2)
Alkaline Phosphatase: 60 U/L (ref 39–117)
BUN: 18 mg/dL (ref 6–23)
CO2: 27 mEq/L (ref 19–32)
Calcium: 9.2 mg/dL (ref 8.4–10.5)
Chloride: 100 mEq/L (ref 96–112)
Creatinine, Ser: 0.9 mg/dL (ref 0.4–1.2)
GFR: 62.72 mL/min (ref >60.00)
Glucose, Bld: 91 mg/dL (ref 70–99)
Potassium: 3.7 mEq/L (ref 3.5–5.1)
Sodium: 135 mEq/L (ref 135–145)
Total Bilirubin: 0.4 mg/dL (ref 0.3–1.2)
Total Protein: 7.3 g/dL (ref 6.0–8.3)

## 2012-06-26 LAB — CBC WITH DIFFERENTIAL/PLATELET
Basophils Absolute: 0.1 10*3/uL (ref 0.0–0.1)
Basophils Relative: 1 % (ref 0.0–3.0)
Eosinophils Absolute: 0.1 10*3/uL (ref 0.0–0.7)
Eosinophils Relative: 1.9 % (ref 0.0–5.0)
HCT: 42.1 % (ref 36.0–46.0)
Hemoglobin: 14.1 g/dL (ref 12.0–15.0)
Lymphocytes Relative: 25.8 % (ref 12.0–46.0)
Lymphs Abs: 1.8 10*3/uL (ref 0.7–4.0)
MCHC: 33.6 g/dL (ref 30.0–36.0)
MCV: 95.9 fl (ref 78.0–100.0)
Monocytes Absolute: 0.8 10*3/uL (ref 0.1–1.0)
Monocytes Relative: 10.9 % (ref 3.0–12.0)
Neutro Abs: 4.2 10*3/uL (ref 1.4–7.7)
Neutrophils Relative %: 60.4 % (ref 43.0–77.0)
Platelets: 214 10*3/uL (ref 150.0–400.0)
RBC: 4.39 Mil/uL (ref 3.87–5.11)
RDW: 14.3 % (ref 11.5–14.6)
WBC: 6.9 10*3/uL (ref 4.5–10.5)

## 2012-06-26 LAB — MICROALBUMIN / CREATININE URINE RATIO
Creatinine,U: 37.8 mg/dL
Microalb Creat Ratio: 111.8 mg/g — ABNORMAL HIGH (ref 0.0–30.0)
Microalb, Ur: 42.3 mg/dL — ABNORMAL HIGH (ref 0.0–1.9)

## 2012-06-26 LAB — LDL CHOLESTEROL, DIRECT: Direct LDL: 122.6 mg/dL

## 2012-06-26 LAB — VITAMIN B12: Vitamin B-12: 218 pg/mL (ref 211–911)

## 2012-06-26 LAB — LIPID PANEL
Cholesterol: 212 mg/dL — ABNORMAL HIGH (ref 0–200)
HDL: 66.6 mg/dL (ref >39.00)
Total CHOL/HDL Ratio: 3
Triglycerides: 125 mg/dL (ref 0.0–149.0)
VLDL: 25 mg/dL (ref 0.0–40.0)

## 2012-06-26 LAB — CK: Total CK: 105 U/L (ref 7–177)

## 2012-06-26 LAB — TSH: TSH: 1.66 u[IU]/mL (ref 0.35–5.50)

## 2012-06-26 LAB — T4, FREE: Free T4: 1.41 ng/dL (ref 0.60–1.60)

## 2012-06-26 MED ORDER — LEVOTHYROXINE SODIUM 175 MCG PO TABS: 175.0000 ug | ORAL_TABLET | Freq: Every day | ORAL | Status: DC

## 2012-06-26 MED ORDER — ROSUVASTATIN CALCIUM 10 MG PO TABS: 10.0000 mg | ORAL_TABLET | Freq: Every day | ORAL | Status: DC

## 2012-06-26 MED ORDER — CYANOCOBALAMIN 1000 MCG/ML IJ SOLN
1000.0000 ug | Freq: Once | INTRAMUSCULAR | Status: AC
  Administered 2012-06-26: 1000 ug via INTRAMUSCULAR

## 2012-06-26 MED ORDER — METOPROLOL TARTRATE 25 MG PO TABS: 25.0000 mg | ORAL_TABLET | Freq: Two times a day (BID) | ORAL | Status: DC

## 2012-06-26 MED ORDER — AMLODIPINE BESYLATE 10 MG PO TABS: 10.0000 mg | ORAL_TABLET | Freq: Every day | ORAL | Status: DC

## 2012-06-26 NOTE — Assessment & Plan Note (Signed)
B12 slightly low in past. Will recheck today and give B12 given ongoing weakness.

## 2012-06-26 NOTE — Assessment & Plan Note (Signed)
Generalized weakness. Exam normal, no focal deficits noted. Will check CBC, CMP, B12, TSH with labs. Will also check CK as pt on Crestor. Follow up 6 months and prn.

## 2012-06-26 NOTE — Assessment & Plan Note (Signed)
Will check lipids with labs today. Continue Crestor. Will also check CK level given ongoing issues with fatigue and weakness.

## 2012-06-26 NOTE — Progress Notes (Signed)
Subjective:    Patient ID: Cathy Solis, female    DOB: 1941/08/05, 71 y.o.   MRN: [id number]  HPI 71 year old female with history of hypertension, hypothyroidism, hyperlipidemia presents for followup. She reports some generalized weakness. She is very active, managing a large farm. However, she reports over the last several months she has become fatigued more quickly. She denies any focal weakness. She denies any muscle pain. She denies any change in appetite or weight. She denies any change in bowel habits. She reports full compliance with her medications.  Outpatient Encounter Prescriptions as of 06/26/2012  Medication Sig Dispense Refill  . amLODipine (NORVASC) 10 MG tablet Take 1 tablet (10 mg total) by mouth daily.  90 tablet  3  . aspirin 325 MG tablet Take 325 mg by mouth daily.        [name] levothyroxine (SYNTHROID, LEVOTHROID) 175 MCG tablet Take 1 tablet (175 mcg total) by mouth daily.  90 tablet  3  . metoprolol tartrate (LOPRESSOR) 25 MG tablet Take 1 tablet (25 mg total) by mouth 2 (two) times daily.  90 tablet  3  . rosuvastatin (CRESTOR) 10 MG tablet Take 1 tablet (10 mg total) by mouth daily.  90 tablet  3  . Calcium Carbonate (CALCIUM 600 PO) Take 1 tablet by mouth daily.         Facility-Administered Encounter Medications as of 06/26/2012  Medication Dose Route Frequency Provider Last Rate Last Dose  . cyanocobalamin ((VITAMIN B-12)) injection 1,000 mcg  1,000 mcg Intramuscular Once Shelia Media, MD   1,000 mcg at 06/26/12 0854   BP 142/80  Pulse 60  Temp 98 F (36.7 C) (Oral)  Ht 5' 4.5" (1.638 m)  Wt 132 lb 8 oz (60.102 kg)  BMI 22.39 kg/m2  SpO2 97%  Review of Systems  Constitutional: Positive for fatigue. Negative for fever, chills, appetite change and unexpected weight change.  HENT: Negative for ear pain, congestion, sore throat, trouble swallowing, neck pain, voice change and sinus pressure.   Eyes: Negative for visual disturbance.  Respiratory: Negative for  cough, shortness of breath, wheezing and stridor.   Cardiovascular: Negative for chest pain, palpitations and leg swelling.  Gastrointestinal: Negative for nausea, vomiting, abdominal pain, diarrhea, constipation, blood in stool, abdominal distention and anal bleeding.  Genitourinary: Negative for dysuria and flank pain.  Musculoskeletal: Negative for myalgias, arthralgias and gait problem.  Skin: Negative for color change and rash.  Neurological: Positive for weakness. Negative for dizziness and headaches.  Hematological: Negative for adenopathy. Does not bruise/bleed easily.  Psychiatric/Behavioral: Negative for suicidal ideas, disturbed wake/sleep cycle and dysphoric mood. The patient is not nervous/anxious.        Objective:   Physical Exam  Constitutional: She is oriented to person, place, and time. She appears well-developed and well-nourished. No distress.  HENT:  Head: Normocephalic and atraumatic.  Right Ear: External ear normal.  Left Ear: External ear normal.  Nose: Nose normal.  Mouth/Throat: Oropharynx is clear and moist. No oropharyngeal exudate.  Eyes: Conjunctivae are normal. Pupils are equal, round, and reactive to light. Right eye exhibits no discharge. Left eye exhibits no discharge. No scleral icterus.  Neck: Normal range of motion. Neck supple. No tracheal deviation present. No thyromegaly present.  Cardiovascular: Normal rate, regular rhythm, normal heart sounds and intact distal pulses.  Exam reveals no gallop and no friction rub.   No murmur heard. Pulmonary/Chest: Effort normal and breath sounds normal. No respiratory distress. She has no wheezes. She has no  rales. She exhibits no tenderness.  Musculoskeletal: Normal range of motion. She exhibits no edema and no tenderness.  Lymphadenopathy:    She has no cervical adenopathy.  Neurological: She is alert and oriented to person, place, and time. No cranial nerve deficit. She exhibits normal muscle tone.  Coordination normal.  Skin: Skin is warm and dry. No rash noted. She is not diaphoretic. No erythema. No pallor.  Psychiatric: She has a normal mood and affect. Her behavior is normal. Judgment and thought content normal.          Assessment & Plan:

## 2012-06-26 NOTE — Assessment & Plan Note (Signed)
Check TSH with labs today. Continue Synthroid.

## 2012-06-26 NOTE — Assessment & Plan Note (Signed)
Will repeat urine microalbumin today.

## 2012-06-26 NOTE — Assessment & Plan Note (Signed)
Blood pressure fairly well-controlled on current medications. Will check renal function with labs today. Continue amlodipine and metoprolol.

## 2012-06-29 ENCOUNTER — Telehealth: Payer: Self-pay | Admitting: *Deleted

## 2012-06-29 NOTE — Telephone Encounter (Signed)
Received PA approval for Crestor, pharmacy notified and PA form sent to be scanned.

## 2012-07-03 ENCOUNTER — Telehealth: Payer: Self-pay | Admitting: *Deleted

## 2012-07-03 ENCOUNTER — Other Ambulatory Visit (INDEPENDENT_AMBULATORY_CARE_PROVIDER_SITE_OTHER): Payer: BC Managed Care – PPO | Admitting: *Deleted

## 2012-07-03 DIAGNOSIS — E538 Deficiency of other specified B group vitamins: Secondary | ICD-10-CM

## 2012-07-03 DIAGNOSIS — R809 Proteinuria, unspecified: Secondary | ICD-10-CM

## 2012-07-03 DIAGNOSIS — Z79899 Other long term (current) drug therapy: Secondary | ICD-10-CM

## 2012-07-03 LAB — BASIC METABOLIC PANEL
BUN: 21 mg/dL (ref 6–23)
CO2: 25 mEq/L (ref 19–32)
Calcium: 9.2 mg/dL (ref 8.4–10.5)
Chloride: 101 mEq/L (ref 96–112)
Creatinine, Ser: 0.8 mg/dL (ref 0.4–1.2)
GFR: 73.55 mL/min (ref >60.00)
Glucose, Bld: 94 mg/dL (ref 70–99)
Potassium: 3.5 mEq/L (ref 3.5–5.1)
Sodium: 136 mEq/L (ref 135–145)

## 2012-07-03 MED ORDER — CYANOCOBALAMIN 1000 MCG/ML IJ SOLN
1000.0000 ug | Freq: Once | INTRAMUSCULAR | Status: AC
  Administered 2012-07-03: 1000 ug via INTRAMUSCULAR

## 2012-07-03 MED ORDER — LISINOPRIL 5 MG PO TABS: 5.0000 mg | ORAL_TABLET | Freq: Every day | ORAL | Status: DC

## 2012-07-03 NOTE — Telephone Encounter (Signed)
Yes, the lisinopril is necessary. However, we need to check a BMP after 1 week of taking the medicine. What is the referral to urology for?

## 2012-07-03 NOTE — Telephone Encounter (Signed)
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

## 2012-07-03 NOTE — Telephone Encounter (Signed)
Left message on machine at home for patient to return call. 

## 2012-07-03 NOTE — Telephone Encounter (Signed)
Patient came in for labs this morning for a bmp, she wast to have this drawn after taking the lisinopril for one week, but after I drew the labs she told me that she has not started the medication yet. She is asking if this is really necessary. She says that she doesn't want to just keep adding medications and stated that she is already taking 2 blood pressure medications. Also she is asking for a referral to urology. Please advise.

## 2012-07-03 NOTE — Telephone Encounter (Signed)
We should probably put her in with a nephrologist. This would make more sense as they monitor kidney function. This would be Dr. Cherylann Ratel.

## 2012-07-03 NOTE — Telephone Encounter (Signed)
Patient notified. She was advise that she will hear from our office regarding referral.

## 2012-07-10 ENCOUNTER — Ambulatory Visit: Payer: BC Managed Care – PPO

## 2012-07-10 ENCOUNTER — Other Ambulatory Visit: Payer: BC Managed Care – PPO

## 2012-10-08 ENCOUNTER — Other Ambulatory Visit: Payer: Self-pay | Admitting: *Deleted

## 2012-10-08 DIAGNOSIS — E039 Hypothyroidism, unspecified: Secondary | ICD-10-CM

## 2012-10-08 MED ORDER — LEVOTHYROXINE SODIUM 175 MCG PO TABS: 175.0000 ug | ORAL_TABLET | Freq: Every day | ORAL | Status: DC

## 2012-10-08 NOTE — Telephone Encounter (Signed)
Reordered synthroid 175 mcg per epic for patient

## 2012-11-07 ENCOUNTER — Other Ambulatory Visit: Payer: Self-pay | Admitting: Internal Medicine

## 2012-11-07 MED ORDER — LISINOPRIL 5 MG PO TABS: 5.0000 mg | ORAL_TABLET | Freq: Every day | ORAL | Status: DC

## 2012-11-07 NOTE — Telephone Encounter (Signed)
Medication filled.  

## 2012-11-07 NOTE — Telephone Encounter (Signed)
Lisinopril 5 mg tab  Take 1 tablet by mouth each day  # 30

## 2012-12-11 ENCOUNTER — Other Ambulatory Visit: Payer: Self-pay | Admitting: Internal Medicine

## 2012-12-11 DIAGNOSIS — I10 Essential (primary) hypertension: Secondary | ICD-10-CM

## 2012-12-11 MED ORDER — METOPROLOL TARTRATE 25 MG PO TABS: 25.0000 mg | ORAL_TABLET | Freq: Two times a day (BID) | ORAL | Status: DC

## 2012-12-11 NOTE — Telephone Encounter (Signed)
metoprolol tartrate (LOPRESSOR) 25 MG tablet  #90

## 2012-12-11 NOTE — Telephone Encounter (Signed)
rx sent to pharmacy by e-script  

## 2013-02-14 ENCOUNTER — Encounter: Payer: Self-pay | Admitting: Internal Medicine

## 2013-02-14 ENCOUNTER — Ambulatory Visit (INDEPENDENT_AMBULATORY_CARE_PROVIDER_SITE_OTHER): Payer: BC Managed Care – PPO | Admitting: Internal Medicine

## 2013-02-14 VITALS — BP 134/82 | HR 60 | Temp 98.0°F | Wt 134.0 lb

## 2013-02-14 DIAGNOSIS — I10 Essential (primary) hypertension: Secondary | ICD-10-CM

## 2013-02-14 DIAGNOSIS — R5383 Other fatigue: Secondary | ICD-10-CM

## 2013-02-14 DIAGNOSIS — R531 Weakness: Secondary | ICD-10-CM

## 2013-02-14 DIAGNOSIS — D510 Vitamin B12 deficiency anemia due to intrinsic factor deficiency: Secondary | ICD-10-CM

## 2013-02-14 DIAGNOSIS — R809 Proteinuria, unspecified: Secondary | ICD-10-CM

## 2013-02-14 DIAGNOSIS — E538 Deficiency of other specified B group vitamins: Secondary | ICD-10-CM

## 2013-02-14 DIAGNOSIS — E785 Hyperlipidemia, unspecified: Secondary | ICD-10-CM

## 2013-02-14 DIAGNOSIS — E039 Hypothyroidism, unspecified: Secondary | ICD-10-CM

## 2013-02-14 DIAGNOSIS — N390 Urinary tract infection, site not specified: Secondary | ICD-10-CM

## 2013-02-14 DIAGNOSIS — R5381 Other malaise: Secondary | ICD-10-CM

## 2013-02-14 LAB — COMPREHENSIVE METABOLIC PANEL
ALT: 15 U/L (ref 0–35)
AST: 20 U/L (ref 0–37)
Albumin: 3.8 g/dL (ref 3.5–5.2)
Alkaline Phosphatase: 71 U/L (ref 39–117)
BUN: 20 mg/dL (ref 6–23)
CO2: 27 mEq/L (ref 19–32)
Calcium: 9.2 mg/dL (ref 8.4–10.5)
Chloride: 100 mEq/L (ref 96–112)
Creatinine, Ser: 0.9 mg/dL (ref 0.4–1.2)
GFR: 66.73 mL/min (ref >60.00)
Glucose, Bld: 95 mg/dL (ref 70–99)
Potassium: 4.2 mEq/L (ref 3.5–5.1)
Sodium: 136 mEq/L (ref 135–145)
Total Bilirubin: 0.4 mg/dL (ref 0.3–1.2)
Total Protein: 7.6 g/dL (ref 6.0–8.3)

## 2013-02-14 LAB — POCT URINALYSIS DIPSTICK
Bilirubin, UA: NEGATIVE
Glucose, UA: NEGATIVE
Ketones, UA: NEGATIVE
Nitrite, UA: NEGATIVE
Protein, UA: 100
Spec Grav, UA: 1.015
Urobilinogen, UA: 0.2
pH, UA: 7

## 2013-02-14 LAB — MICROALBUMIN / CREATININE URINE RATIO
Creatinine,U: 34.3 mg/dL
Microalb Creat Ratio: 115.5 mg/g — ABNORMAL HIGH (ref 0.0–30.0)
Microalb, Ur: 39.6 mg/dL — ABNORMAL HIGH (ref 0.0–1.9)

## 2013-02-14 MED ORDER — CYANOCOBALAMIN 1000 MCG/ML IJ SOLN
1000.0000 ug | Freq: Once | INTRAMUSCULAR | Status: AC
  Administered 2013-02-14: 1000 ug via INTRAMUSCULAR

## 2013-02-14 MED ORDER — CYANOCOBALAMIN 1000 MCG/ML IJ SOLN: 1000.0000 ug | INTRAMUSCULAR | Status: DC

## 2013-02-14 MED ORDER — METOPROLOL TARTRATE 25 MG PO TABS: 25.0000 mg | ORAL_TABLET | Freq: Two times a day (BID) | ORAL | Status: DC

## 2013-02-14 MED ORDER — LISINOPRIL 5 MG PO TABS: 5.0000 mg | ORAL_TABLET | Freq: Every day | ORAL | Status: DC

## 2013-02-14 MED ORDER — AMLODIPINE BESYLATE 10 MG PO TABS: 10.0000 mg | ORAL_TABLET | Freq: Every day | ORAL | Status: DC

## 2013-02-14 MED ORDER — LEVOTHYROXINE SODIUM 175 MCG PO TABS: 175.0000 ug | ORAL_TABLET | Freq: Every day | ORAL | Status: DC

## 2013-02-14 MED ORDER — ROSUVASTATIN CALCIUM 10 MG PO TABS: 10.0000 mg | ORAL_TABLET | Freq: Every day | ORAL | Status: DC

## 2013-02-14 NOTE — Addendum Note (Signed)
Addended by: Montine Circle D on: 02/14/2013 01:47 PM   Modules accepted: Orders

## 2013-02-14 NOTE — Assessment & Plan Note (Signed)
Discussed importance of monthly B12 shots. Suspect this is playing a role in fatigue and muscle weakness. B12 given today.

## 2013-02-14 NOTE — Assessment & Plan Note (Signed)
Likely related to untreated B12 deficiency. Discussed importance of monthly B12 injection. Rx given today. Pt niece will give her the injections at home.

## 2013-02-14 NOTE — Assessment & Plan Note (Signed)
BP Readings from Last 3 Encounters:  02/14/13 134/82  06/26/12 142/80  12/29/11 110/70   Blood pressure well-controlled on current medications. Will continue. Will check renal function with labs today.

## 2013-02-14 NOTE — Assessment & Plan Note (Signed)
Will check urine microalbumin with labs today. Continue lisinopril.

## 2013-02-14 NOTE — Assessment & Plan Note (Signed)
Will check TSH with labs today. Continue levothyroxine. 

## 2013-02-14 NOTE — Assessment & Plan Note (Signed)
Will check LFTs with labs today. Continue Crestor. 

## 2013-02-14 NOTE — Progress Notes (Signed)
Subjective:    Patient ID: Cathy Solis, female    DOB: [birth year], 72 y.o.   MRN: [id number]  HPI 72 year old female with history of hypertension, hyperlipidemia, hypothyroidism, pernicious anemia presents for followup. She reports she is generally doing well. She has some ongoing issues with fatigue and muscle weakness. However, she has not been following up with B12 injections. The last injection was over 6 months ago. She reports compliance with her other medications. She denies any shortness of breath, chest pain, palpitations. She reports normal appetite and no recent changes in bowel habits. No other new concerns today.  Outpatient Encounter Prescriptions as of 02/14/2013  Medication Sig Dispense Refill  . amLODipine (NORVASC) 10 MG tablet Take 1 tablet (10 mg total) by mouth daily.  90 tablet  3  . aspirin 325 MG tablet Take 325 mg by mouth daily.        [name] levothyroxine (SYNTHROID, LEVOTHROID) 175 MCG tablet Take 1 tablet (175 mcg total) by mouth daily.  90 tablet  3  . lisinopril (PRINIVIL,ZESTRIL) 5 MG tablet Take 1 tablet (5 mg total) by mouth daily.  90 tablet  3  . metoprolol tartrate (LOPRESSOR) 25 MG tablet Take 1 tablet (25 mg total) by mouth 2 (two) times daily.  90 tablet  3  . rosuvastatin (CRESTOR) 10 MG tablet Take 1 tablet (10 mg total) by mouth daily.  90 tablet  3  . Calcium Carbonate (CALCIUM 600 PO) Take 1 tablet by mouth daily.        . cyanocobalamin (,VITAMIN B-12,) 1000 MCG/ML injection Inject 1 mL (1,000 mcg total) into the muscle every 30 (thirty) days.  10 mL  3  . [EXPIRED] cyanocobalamin ((VITAMIN B-12)) injection 1,000 mcg        No facility-administered encounter medications on file as of 02/14/2013.   BP 134/82  Pulse 60  Temp(Src) 98 F (36.7 C) (Oral)  Wt 134 lb (60.782 kg)  BMI 22.65 kg/m2  SpO2 97%  Review of Systems  Constitutional: Negative for fever, chills, appetite change, fatigue and unexpected weight change.  HENT: Negative for ear pain,  congestion, sore throat, trouble swallowing, neck pain, voice change and sinus pressure.   Eyes: Negative for visual disturbance.  Respiratory: Negative for cough, shortness of breath, wheezing and stridor.   Cardiovascular: Negative for chest pain, palpitations and leg swelling.  Gastrointestinal: Negative for nausea, vomiting, abdominal pain, diarrhea, constipation, blood in stool, abdominal distention and anal bleeding.  Genitourinary: Negative for dysuria and flank pain.  Musculoskeletal: Negative for myalgias, arthralgias and gait problem.  Skin: Negative for color change and rash.  Neurological: Negative for dizziness and headaches.  Hematological: Negative for adenopathy. Does not bruise/bleed easily.  Psychiatric/Behavioral: Negative for suicidal ideas, sleep disturbance and dysphoric mood. The patient is not nervous/anxious.        Objective:   Physical Exam  Constitutional: She is oriented to person, place, and time. She appears well-developed and well-nourished. No distress.  HENT:  Head: Normocephalic and atraumatic.  Right Ear: External ear normal.  Left Ear: External ear normal.  Nose: Nose normal.  Mouth/Throat: Oropharynx is clear and moist. No oropharyngeal exudate.  Eyes: Conjunctivae are normal. Pupils are equal, round, and reactive to light. Right eye exhibits no discharge. Left eye exhibits no discharge. No scleral icterus.  Neck: Normal range of motion. Neck supple. No tracheal deviation present. No thyromegaly present.  Cardiovascular: Normal rate, regular rhythm, normal heart sounds and intact distal pulses.  Exam reveals no gallop  and no friction rub.   No murmur heard. Pulmonary/Chest: Effort normal and breath sounds normal. No respiratory distress. She has no wheezes. She has no rales. She exhibits no tenderness.  Musculoskeletal: Normal range of motion. She exhibits no edema and no tenderness.  Lymphadenopathy:    She has no cervical adenopathy.   Neurological: She is alert and oriented to person, place, and time. No cranial nerve deficit. She exhibits normal muscle tone. Coordination normal.  Skin: Skin is warm and dry. No rash noted. She is not diaphoretic. No erythema. No pallor.  Psychiatric: She has a normal mood and affect. Her behavior is normal. Judgment and thought content normal.          Assessment & Plan:

## 2013-02-15 ENCOUNTER — Encounter: Payer: Self-pay | Admitting: *Deleted

## 2013-02-16 LAB — URINE CULTURE: Colony Count: 10000

## 2013-02-18 ENCOUNTER — Telehealth: Payer: Self-pay | Admitting: Internal Medicine

## 2013-02-18 NOTE — Telephone Encounter (Signed)
Fwd to Dr. Walker 

## 2013-02-18 NOTE — Telephone Encounter (Signed)
Patient needing an order for a mammogram °

## 2013-02-18 NOTE — Telephone Encounter (Signed)
Can we use order in system?

## 2013-02-19 NOTE — Telephone Encounter (Signed)
The order is for diagnostic bilateral and expires on 02/25/13. Do we know if she has this particular exam?

## 2013-02-19 NOTE — Telephone Encounter (Signed)
She has not yet had her mammogram.

## 2013-02-21 NOTE — Telephone Encounter (Signed)
Patient has an apt at [facility] 4/10 @ 130. The patient is aware of this apt. They stated the patient has a bilateral mammo on 3/28 of last year.

## 2013-02-21 NOTE — Telephone Encounter (Signed)
Please call patient, she would like to have her mammogram done soon.

## 2013-02-28 ENCOUNTER — Ambulatory Visit: Payer: Self-pay | Admitting: Internal Medicine

## 2013-05-23 ENCOUNTER — Encounter: Payer: Self-pay | Admitting: General Surgery

## 2013-05-23 ENCOUNTER — Ambulatory Visit (INDEPENDENT_AMBULATORY_CARE_PROVIDER_SITE_OTHER): Payer: PRIVATE HEALTH INSURANCE | Admitting: General Surgery

## 2013-05-23 DIAGNOSIS — I6529 Occlusion and stenosis of unspecified carotid artery: Secondary | ICD-10-CM | POA: Insufficient documentation

## 2013-05-23 NOTE — Progress Notes (Signed)
Patient ID: Cathy Solis, female   DOB: [birth year], 72 y.o.   MRN: [id number]  Chief Complaint  Patient presents with  . Other    carotid ultrasound    HPI Cathy Solis is a 72 y.o. female here today for an carotid ultrasound.She is 6 years post left CEA. No problems in last 1 yr.   HPI  Past Medical History  Diagnosis Date  . Toe infection     followed by Dr. Clide Cliff  . Stroke     residual left sided weakness  . Hypertension   . 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 Kidney problem     Undeveloped R kidney  . Breast cancer     Left breast, s/p radiation  . 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 Abdominal hysterectomy    . Bladder repair    . Breast surgery      left  . Carotid endarterectomy  2008    left  . Kidney surgery  1949  . Eye surgery Right 2013    cataract  . Colonoscopy  2008    Family History  Problem Relation Age of Onset  . Stroke Mother   . Heart disease Father     Social History History  Substance Use Topics  . Smoking status: Current Every Day Smoker -- 1.00 packs/day for 30 years    Types: Cigarettes  . Smokeless tobacco: Never Used  . Alcohol Use: No    No Known Allergies  Current Outpatient Prescriptions  Medication Sig Dispense Refill  . amLODipine (NORVASC) 10 MG tablet Take 1 tablet (10 mg total) by mouth daily.  90 tablet  3  . aspirin 325 MG tablet Take 325 mg by mouth daily.        . cyanocobalamin (,VITAMIN B-12,) 1000 MCG/ML injection Inject 1 mL (1,000 mcg total) into the muscle every 30 (thirty) days.  10 mL  3  . levothyroxine (SYNTHROID, LEVOTHROID) 175 MCG tablet Take 1 tablet (175 mcg total) by mouth daily.  90 tablet  3  . lisinopril (PRINIVIL,ZESTRIL) 5 MG tablet Take 1 tablet (5 mg total) by mouth daily.  90 tablet  3  . metoprolol tartrate (LOPRESSOR) 25 MG tablet Take 1 tablet (25 mg total) by mouth 2 (two) times daily.  90 tablet  3   . rosuvastatin (CRESTOR) 10 MG tablet Take 1 tablet (10 mg total) by mouth daily.  90 tablet  3   No current facility-administered medications for this visit.    Review of Systems Review of Systems  Constitutional: Negative.   Respiratory: Negative.   Cardiovascular: Negative.     Blood pressure 110/64, pulse 68, resp. rate 12, height 5' 6.5" (1.689 m), weight 132 lb (59.875 kg).  Physical Exam Physical Exam  Constitutional: She is oriented to person, place, and time. She appears well-developed and well-nourished.  Neck: Carotid bruit is not present.  Neurological: She is alert and oriented to person, place, and time.    Data Reviewed Prior Doppler   Assessment    Stable mild carotid plaquing.     Plan    1 yr f/u     Bilateral carotid doppler was performed. On both sides there mild intimal thickening in CCA. Left ICA is clean. Right ICA has mild plaquing 2 mm in thickness an 1cm long. Mild flow increase noted.  Both vertebrals have antegrade flow. ICA/CCA ratio  is 1.0 on left, 1.3 on right.  Impression: mild stenosis of right ICA, 25% .    Patriciaann Rabanal G 05/24/2013, 9:11 AM

## 2013-05-23 NOTE — Patient Instructions (Addendum)
Patient to return in 1 year with office carotid ultrasound.

## 2013-05-24 ENCOUNTER — Encounter: Payer: Self-pay | Admitting: General Surgery

## 2013-06-18 ENCOUNTER — Encounter: Payer: Self-pay | Admitting: General Surgery

## 2013-06-25 ENCOUNTER — Encounter: Payer: Self-pay | Admitting: Internal Medicine

## 2013-06-25 ENCOUNTER — Ambulatory Visit (INDEPENDENT_AMBULATORY_CARE_PROVIDER_SITE_OTHER): Payer: PRIVATE HEALTH INSURANCE | Admitting: Internal Medicine

## 2013-06-25 VITALS — BP 132/74 | HR 57 | Temp 97.8°F | Wt 130.0 lb

## 2013-06-25 DIAGNOSIS — E785 Hyperlipidemia, unspecified: Secondary | ICD-10-CM

## 2013-06-25 DIAGNOSIS — R809 Proteinuria, unspecified: Secondary | ICD-10-CM

## 2013-06-25 DIAGNOSIS — D510 Vitamin B12 deficiency anemia due to intrinsic factor deficiency: Secondary | ICD-10-CM

## 2013-06-25 DIAGNOSIS — I10 Essential (primary) hypertension: Secondary | ICD-10-CM

## 2013-06-25 DIAGNOSIS — Z780 Asymptomatic menopausal state: Secondary | ICD-10-CM

## 2013-06-25 DIAGNOSIS — E039 Hypothyroidism, unspecified: Secondary | ICD-10-CM

## 2013-06-25 LAB — MICROALBUMIN / CREATININE URINE RATIO
Creatinine,U: 34 mg/dL
Microalb Creat Ratio: 91.3 mg/g — ABNORMAL HIGH (ref 0.0–30.0)
Microalb, Ur: 31 mg/dL — ABNORMAL HIGH (ref 0.0–1.9)

## 2013-06-25 LAB — LIPID PANEL
Cholesterol: 195 mg/dL (ref 0–200)
HDL: 58.2 mg/dL (ref >39.00)
LDL Cholesterol: 119 mg/dL — ABNORMAL HIGH (ref 0–99)
Total CHOL/HDL Ratio: 3
Triglycerides: 91 mg/dL (ref 0.0–149.0)
VLDL: 18.2 mg/dL (ref 0.0–40.0)

## 2013-06-25 LAB — COMPREHENSIVE METABOLIC PANEL
ALT: 12 U/L (ref 0–35)
AST: 18 U/L (ref 0–37)
Albumin: 3.6 g/dL (ref 3.5–5.2)
Alkaline Phosphatase: 58 U/L (ref 39–117)
BUN: 14 mg/dL (ref 6–23)
CO2: 25 mEq/L (ref 19–32)
Calcium: 9.5 mg/dL (ref 8.4–10.5)
Chloride: 102 mEq/L (ref 96–112)
Creatinine, Ser: 0.8 mg/dL (ref 0.4–1.2)
GFR: 73.36 mL/min (ref >60.00)
Glucose, Bld: 97 mg/dL (ref 70–99)
Potassium: 4.1 mEq/L (ref 3.5–5.1)
Sodium: 135 mEq/L (ref 135–145)
Total Bilirubin: 0.4 mg/dL (ref 0.3–1.2)
Total Protein: 7.5 g/dL (ref 6.0–8.3)

## 2013-06-25 LAB — TSH: TSH: 1.85 u[IU]/mL (ref 0.35–5.50)

## 2013-06-25 MED ORDER — CYANOCOBALAMIN 1000 MCG/ML IJ SOLN: 1000.0000 ug | INTRAMUSCULAR | Status: DC

## 2013-06-25 NOTE — Assessment & Plan Note (Signed)
Thyroid function normal. Continue current dose of Levothyroxine.

## 2013-06-25 NOTE — Assessment & Plan Note (Signed)
Persistent symptoms with fatigue, occasional leg weakness. Will restart B12 dosing weekly x3 then q3-4 weeks.

## 2013-06-25 NOTE — Assessment & Plan Note (Signed)
Stable on recent labs. Continue ACEi.
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
## 2013-06-25 NOTE — Assessment & Plan Note (Signed)
Lab Results  Component Value Date   LDLCALC 119* 06/25/2013   Lipids improved with Crestor. LFTs normal. Will continue.

## 2013-06-25 NOTE — Assessment & Plan Note (Signed)
BP Readings from Last 3 Encounters:  06/25/13 132/74  05/23/13 110/64  02/14/13 134/82   BP well controlled on current medications. Will continue.

## 2013-06-26 ENCOUNTER — Encounter: Payer: Self-pay | Admitting: *Deleted

## 2013-07-08 ENCOUNTER — Telehealth: Payer: Self-pay | Admitting: Internal Medicine

## 2013-07-08 DIAGNOSIS — D510 Vitamin B12 deficiency anemia due to intrinsic factor deficiency: Secondary | ICD-10-CM

## 2013-07-08 NOTE — Telephone Encounter (Signed)
Left message to call back  

## 2013-07-08 NOTE — Telephone Encounter (Signed)
Pt has question about b-12 ???

## 2013-07-09 MED ORDER — CYANOCOBALAMIN 1000 MCG/ML IJ SOLN: INTRAMUSCULAR | Status: DC

## 2013-07-09 NOTE — Telephone Encounter (Signed)
Spoke with patient and she stated when she picked up her B12 injections, it was still for once a month and per her visit it was supposed to be once a week for 3 weeks then once a month. New Rx sent to pharmacy.

## 2013-07-16 ENCOUNTER — Telehealth: Payer: Self-pay | Admitting: Internal Medicine

## 2013-07-16 NOTE — Telephone Encounter (Signed)
Need to change her cholesterol medication. She had a change in her insurance which made the cost of her Crestor go up. Would like new prescription sent to [facility]

## 2013-07-16 NOTE — Telephone Encounter (Signed)
Left message to call back  

## 2013-07-16 NOTE — Telephone Encounter (Signed)
I looked back in her record. I only see Crestor. Can you confirm what she was taking before?

## 2013-07-16 NOTE — Telephone Encounter (Signed)
She is not coming back until February and she is ok to switch back to the one she was taking prior to that. She is just not going to pay $136 for medication.

## 2013-07-16 NOTE — Telephone Encounter (Signed)
Can we give her samples of Crestor to last until her next follow up?

## 2013-07-19 MED ORDER — ATORVASTATIN CALCIUM 20 MG PO TABS: 20.0000 mg | ORAL_TABLET | Freq: Every day | ORAL | Status: DC

## 2013-07-19 NOTE — Telephone Encounter (Signed)
Has she tried Lipitor? If not, let's try Atorvastatin 20mg  daily #30 with 3 refills.

## 2013-07-19 NOTE — Telephone Encounter (Signed)
She was prescribed Mevacor before, she was switched to the Crestor by you. She just really need something cheaper than the Crestor because it is too expensive.

## 2013-07-19 NOTE — Telephone Encounter (Signed)
Rx sent to the pharmacy.

## 2013-09-17 ENCOUNTER — Ambulatory Visit: Payer: Self-pay | Admitting: Gastroenterology

## 2013-09-18 LAB — PATHOLOGY REPORT

## 2013-10-08 ENCOUNTER — Encounter: Payer: Self-pay | Admitting: Internal Medicine

## 2013-10-28 ENCOUNTER — Other Ambulatory Visit: Payer: Self-pay | Admitting: *Deleted

## 2013-10-28 DIAGNOSIS — I10 Essential (primary) hypertension: Secondary | ICD-10-CM

## 2013-10-28 MED ORDER — METOPROLOL TARTRATE 25 MG PO TABS: 25.0000 mg | ORAL_TABLET | Freq: Two times a day (BID) | ORAL | Status: DC

## 2013-10-31 ENCOUNTER — Ambulatory Visit: Payer: Self-pay

## 2013-11-12 ENCOUNTER — Ambulatory Visit (INDEPENDENT_AMBULATORY_CARE_PROVIDER_SITE_OTHER): Payer: PRIVATE HEALTH INSURANCE | Admitting: Internal Medicine

## 2013-11-12 ENCOUNTER — Encounter: Payer: Self-pay | Admitting: Internal Medicine

## 2013-11-12 VITALS — BP 144/82 | HR 58 | Temp 97.8°F | Resp 12 | Ht 65.0 in | Wt 131.0 lb

## 2013-11-12 DIAGNOSIS — Z780 Asymptomatic menopausal state: Secondary | ICD-10-CM

## 2013-11-12 DIAGNOSIS — Z0000 Encounter for general adult medical examination without abnormal findings: Secondary | ICD-10-CM

## 2013-11-12 LAB — HM PAP SMEAR

## 2013-11-12 NOTE — Progress Notes (Signed)
Subjective:    Patient ID: Cathy Solis, female    DOB: 05-17-1941, 72 y.o.   MRN: [id number]  HPI The patient is here for annual Medicare wellness examination and management of other chronic and acute problems.   The risk factors are reflected in the social history.  The roster of all physicians providing medical care to patient - is listed in the Snapshot section of the chart.  Activities of daily living:  The patient is 100% independent in all ADLs: dressing, toileting, feeding as well as independent mobility. Lives alone.  Home safety : The patient has smoke detectors in the home. They wear seatbelts.  There are no firearms at home. There is no violence in the home.   There is no risks for hepatitis, STDs or HIV. There is no  history of blood transfusion. They have no travel history to infectious disease endemic areas of the world.  The patient has seen their dentist in the last six month. Dentist - Dr. Axel Filler They have seen their eye doctor in the last year. Opthalmology - Dr. Inez Pilgrim No problems with hearing.  Slight decrease in hearing over many years. They have deferred audiologic testing in the last year.   They have excessive sun exposure. Discussed the need for sun protection: hats, long sleeves and use of sunscreen if there is significant sun exposure. Dermatologist - Dr. Orson Aloe  Diet: the importance of a healthy diet is discussed. They do have a healthy diet.  The benefits of regular aerobic exercise were discussed. Very physically active outdoors.  Depression screen: there are no signs or vegative symptoms of depression- irritability, change in appetite, anhedonia, sadness/tearfullness.  Cognitive assessment: the patient manages all their financial and personal affairs and is actively engaged. They could relate day,date,year and events.  The following portions of the patient's history were reviewed and updated as appropriate: allergies, current medications, past  family history, past medical history,  past surgical history, past social history  and problem list.  Visual acuity was not assessed per patient preference since she has regular follow up with her ophthalmologist. Hearing and body mass index were assessed and reviewed.   During the course of the visit the patient was educated and counseled about appropriate screening and preventive services including : fall prevention , diabetes screening, nutrition counseling, colorectal cancer screening, and recommended immunizations.    Outpatient Encounter Prescriptions as of 11/12/2013  Medication Sig  . amLODipine (NORVASC) 10 MG tablet Take 1 tablet (10 mg total) by mouth daily.  [name] aspirin 325 MG tablet Take 325 mg by mouth daily.    [name] atorvastatin (LIPITOR) 20 MG tablet Take 1 tablet (20 mg total) by mouth daily.  . cyanocobalamin (,VITAMIN B-12,) 1000 MCG/ML injection Inject 1 mL once a week for 3 weeks, after 3rd week inject 1 mL once a month  . levothyroxine (SYNTHROID, LEVOTHROID) 175 MCG tablet Take 1 tablet (175 mcg total) by mouth daily.  [name] lisinopril (PRINIVIL,ZESTRIL) 5 MG tablet Take 1 tablet (5 mg total) by mouth daily.  . metoprolol tartrate (LOPRESSOR) 25 MG tablet Take 1 tablet (25 mg total) by mouth 2 (two) times daily.   BP 144/82  Pulse 58  Temp(Src) 97.8 F (36.6 C) (Oral)  Resp 12  Ht 5\' 5"  (1.651 m)  Wt 131 lb (59.421 kg)  BMI 21.80 kg/m2  SpO2 98%   Review of Systems  Constitutional: Negative for fever, chills, appetite change, fatigue and unexpected weight change.  HENT: Negative for congestion,  ear pain, sinus pressure, sore throat, trouble swallowing and voice change.   Eyes: Negative for visual disturbance.  Respiratory: Negative for cough, shortness of breath, wheezing and stridor.   Cardiovascular: Negative for chest pain, palpitations and leg swelling.  Gastrointestinal: Negative for nausea, vomiting, abdominal pain, diarrhea, constipation, blood in stool,  abdominal distention and anal bleeding.  Genitourinary: Negative for dysuria and flank pain.  Musculoskeletal: Negative for arthralgias, gait problem, myalgias and neck pain.  Skin: Negative for color change and rash.  Neurological: Negative for dizziness and headaches.  Hematological: Negative for adenopathy. Does not bruise/bleed easily.  Psychiatric/Behavioral: Negative for suicidal ideas, sleep disturbance and dysphoric mood. The patient is not nervous/anxious.        Objective:   Physical Exam  Constitutional: She is oriented to person, place, and time. She appears well-developed and well-nourished. No distress.  HENT:  Head: Normocephalic and atraumatic.  Right Ear: External ear normal.  Left Ear: External ear normal.  Nose: Nose normal.  Mouth/Throat: Oropharynx is clear and moist. No oropharyngeal exudate.  Eyes: Conjunctivae are normal. Pupils are equal, round, and reactive to light. Right eye exhibits no discharge. Left eye exhibits no discharge. No scleral icterus.  Neck: Normal range of motion. Neck supple. No tracheal deviation present. No thyromegaly present.  Cardiovascular: Normal rate, regular rhythm, normal heart sounds and intact distal pulses.  Exam reveals no gallop and no friction rub.   No murmur heard. Pulmonary/Chest: Effort normal and breath sounds normal. No accessory muscle usage. Not tachypneic. No respiratory distress. She has no decreased breath sounds. She has no wheezes. She has no rales. She exhibits no tenderness. Right breast exhibits no inverted nipple, no mass, no nipple discharge, no skin change and no tenderness. Left breast exhibits no inverted nipple, no mass, no nipple discharge, no skin change and no tenderness. Breasts are symmetrical.  Abdominal: Soft. Bowel sounds are normal. She exhibits no distension and no mass. There is no tenderness. There is no rebound and no guarding.  Musculoskeletal: Normal range of motion. She exhibits no edema and no  tenderness.  Lymphadenopathy:    She has no cervical adenopathy.  Neurological: She is alert and oriented to person, place, and time. No cranial nerve deficit. She exhibits normal muscle tone. Coordination normal.  Skin: Skin is warm and dry. No rash noted. She is not diaphoretic. No erythema. No pallor.  Psychiatric: She has a normal mood and affect. Her behavior is normal. Judgment and thought content normal.          Assessment & Plan:

## 2013-11-12 NOTE — Progress Notes (Signed)
Pre visit review using our clinic review tool, if applicable. No additional management support is needed unless otherwise documented below in the visit note. 

## 2013-11-13 DIAGNOSIS — Z780 Asymptomatic menopausal state: Secondary | ICD-10-CM | POA: Insufficient documentation

## 2013-11-13 DIAGNOSIS — Z0000 Encounter for general adult medical examination without abnormal findings: Secondary | ICD-10-CM | POA: Insufficient documentation

## 2013-11-13 NOTE — Assessment & Plan Note (Signed)
General medical exam including breast exam normal today. Pap and pelvic deferred given patient's age and patient preference. Immunizations are up-to-date. Colonoscopy and mammogram are up-to-date. Encouraged healthy diet and regular physical activity. Reviewed recent labs from August 2014 which were normal. Plan followup in 6 months or sooner as needed.

## 2013-12-31 ENCOUNTER — Other Ambulatory Visit: Payer: Self-pay | Admitting: *Deleted

## 2013-12-31 MED ORDER — ATORVASTATIN CALCIUM 20 MG PO TABS: 20.0000 mg | ORAL_TABLET | Freq: Every day | ORAL | Status: DC

## 2014-01-02 ENCOUNTER — Encounter: Payer: PRIVATE HEALTH INSURANCE | Admitting: Internal Medicine

## 2014-01-03 ENCOUNTER — Emergency Department: Payer: Self-pay | Admitting: Emergency Medicine

## 2014-01-03 LAB — URINALYSIS, COMPLETE
Bacteria: NONE SEEN
Bilirubin,UR: NEGATIVE
Blood: NEGATIVE
Glucose,UR: NEGATIVE mg/dL (ref 0–75)
Hyaline Cast: 3
Ketone: NEGATIVE
Leukocyte Esterase: NEGATIVE
Nitrite: NEGATIVE
Ph: 7 (ref 4.5–8.0)
Protein: 100
RBC,UR: NONE SEEN /HPF (ref 0–5)
Specific Gravity: 1.01 (ref 1.003–1.030)
Squamous Epithelial: <1
WBC UR: 1 /HPF (ref 0–5)

## 2014-01-03 LAB — BASIC METABOLIC PANEL
Anion Gap: 7 (ref 7–16)
BUN: 15 mg/dL (ref 7–18)
Calcium, Total: 10.2 mg/dL — ABNORMAL HIGH (ref 8.5–10.1)
Chloride: 97 mmol/L — ABNORMAL LOW (ref 98–107)
Co2: 30 mmol/L (ref 21–32)
Creatinine: 0.93 mg/dL (ref 0.60–1.30)
EGFR (African American): >60
EGFR (Non-African Amer.): >60
Glucose: 112 mg/dL — ABNORMAL HIGH (ref 65–99)
Osmolality: 270 (ref 275–301)
Potassium: 3.6 mmol/L (ref 3.5–5.1)
Sodium: 134 mmol/L — ABNORMAL LOW (ref 136–145)

## 2014-01-03 LAB — CBC
HCT: 39.9 % (ref 35.0–47.0)
HGB: 13.6 g/dL (ref 12.0–16.0)
MCH: 31.6 pg (ref 26.0–34.0)
MCHC: 34 g/dL (ref 32.0–36.0)
MCV: 93 fL (ref 80–100)
Platelet: 289 10*3/uL (ref 150–440)
RBC: 4.29 10*6/uL (ref 3.80–5.20)
RDW: 13.8 % (ref 11.5–14.5)
WBC: 8 10*3/uL (ref 3.6–11.0)

## 2014-02-04 DIAGNOSIS — N135 Crossing vessel and stricture of ureter without hydronephrosis: Secondary | ICD-10-CM | POA: Insufficient documentation

## 2014-02-04 DIAGNOSIS — N1330 Unspecified hydronephrosis: Secondary | ICD-10-CM | POA: Insufficient documentation

## 2014-02-11 ENCOUNTER — Ambulatory Visit: Payer: Self-pay | Admitting: Urology

## 2014-03-03 ENCOUNTER — Other Ambulatory Visit: Payer: Self-pay | Admitting: *Deleted

## 2014-03-03 DIAGNOSIS — I10 Essential (primary) hypertension: Secondary | ICD-10-CM

## 2014-03-03 DIAGNOSIS — E039 Hypothyroidism, unspecified: Secondary | ICD-10-CM

## 2014-03-03 MED ORDER — LISINOPRIL 5 MG PO TABS: 5.0000 mg | ORAL_TABLET | Freq: Every day | ORAL | Status: DC

## 2014-03-03 MED ORDER — LEVOTHYROXINE SODIUM 175 MCG PO TABS: 175.0000 ug | ORAL_TABLET | Freq: Every day | ORAL | Status: DC

## 2014-04-07 ENCOUNTER — Other Ambulatory Visit: Payer: Self-pay | Admitting: *Deleted

## 2014-04-07 ENCOUNTER — Telehealth: Payer: Self-pay | Admitting: Internal Medicine

## 2014-04-07 DIAGNOSIS — I10 Essential (primary) hypertension: Secondary | ICD-10-CM

## 2014-04-07 MED ORDER — AMLODIPINE BESYLATE 10 MG PO TABS: 10.0000 mg | ORAL_TABLET | Freq: Every day | ORAL | Status: DC

## 2014-04-07 MED ORDER — METOPROLOL TARTRATE 25 MG PO TABS: 25.0000 mg | ORAL_TABLET | Freq: Two times a day (BID) | ORAL | Status: DC

## 2014-04-07 MED ORDER — ATORVASTATIN CALCIUM 20 MG PO TABS: 20.0000 mg | ORAL_TABLET | Freq: Every day | ORAL | Status: DC

## 2014-04-07 NOTE — Telephone Encounter (Signed)
Requesting #[phone].

## 2014-04-07 NOTE — Telephone Encounter (Signed)
Pt states she needs refills for amlodipine; out for over a week.  Pt states she also has two others that will be running out soon, metoprolol and atorvastatin.  Appt 5/28.

## 2014-04-08 ENCOUNTER — Telehealth: Payer: Self-pay | Admitting: Internal Medicine

## 2014-04-08 NOTE — Telephone Encounter (Signed)
Relevant patient education mailed to patient.  

## 2014-04-17 ENCOUNTER — Ambulatory Visit (INDEPENDENT_AMBULATORY_CARE_PROVIDER_SITE_OTHER): Payer: Medicare PPO | Admitting: Internal Medicine

## 2014-04-17 ENCOUNTER — Encounter: Payer: Self-pay | Admitting: Internal Medicine

## 2014-04-17 VITALS — BP 118/70 | HR 63 | Temp 98.4°F | Ht 65.0 in | Wt 130.2 lb

## 2014-04-17 DIAGNOSIS — E785 Hyperlipidemia, unspecified: Secondary | ICD-10-CM

## 2014-04-17 DIAGNOSIS — E039 Hypothyroidism, unspecified: Secondary | ICD-10-CM

## 2014-04-17 DIAGNOSIS — D510 Vitamin B12 deficiency anemia due to intrinsic factor deficiency: Secondary | ICD-10-CM

## 2014-04-17 DIAGNOSIS — N135 Crossing vessel and stricture of ureter without hydronephrosis: Secondary | ICD-10-CM

## 2014-04-17 DIAGNOSIS — E538 Deficiency of other specified B group vitamins: Secondary | ICD-10-CM

## 2014-04-17 DIAGNOSIS — I10 Essential (primary) hypertension: Secondary | ICD-10-CM

## 2014-04-17 LAB — TSH: TSH: 1.9 u[IU]/mL (ref 0.35–4.50)

## 2014-04-17 LAB — LIPID PANEL
Cholesterol: 206 mg/dL — ABNORMAL HIGH (ref 0–200)
HDL: 77.5 mg/dL (ref >39.00)
LDL Cholesterol: 115 mg/dL — ABNORMAL HIGH (ref 0–99)
Total CHOL/HDL Ratio: 3
Triglycerides: 68 mg/dL (ref 0.0–149.0)
VLDL: 13.6 mg/dL (ref 0.0–40.0)

## 2014-04-17 LAB — CBC WITH DIFFERENTIAL/PLATELET
Basophils Absolute: 0 10*3/uL (ref 0.0–0.1)
Basophils Relative: 0.2 % (ref 0.0–3.0)
Eosinophils Absolute: 0.1 10*3/uL (ref 0.0–0.7)
Eosinophils Relative: 1.4 % (ref 0.0–5.0)
HCT: 38.7 % (ref 36.0–46.0)
Hemoglobin: 13.1 g/dL (ref 12.0–15.0)
Lymphocytes Relative: 21.5 % (ref 12.0–46.0)
Lymphs Abs: 2 10*3/uL (ref 0.7–4.0)
MCHC: 34 g/dL (ref 30.0–36.0)
MCV: 89.9 fl (ref 78.0–100.0)
Monocytes Absolute: 1.2 10*3/uL — ABNORMAL HIGH (ref 0.1–1.0)
Monocytes Relative: 12.6 % — ABNORMAL HIGH (ref 3.0–12.0)
Neutro Abs: 6 10*3/uL (ref 1.4–7.7)
Neutrophils Relative %: 64.3 % (ref 43.0–77.0)
Platelets: 298 10*3/uL (ref 150.0–400.0)
RBC: 4.3 Mil/uL (ref 3.87–5.11)
RDW: 15.6 % — ABNORMAL HIGH (ref 11.5–15.5)
WBC: 9.4 10*3/uL (ref 4.0–10.5)

## 2014-04-17 LAB — MICROALBUMIN / CREATININE URINE RATIO
Creatinine,U: 57.5 mg/dL
Microalb Creat Ratio: 61.9 mg/g — ABNORMAL HIGH (ref 0.0–30.0)
Microalb, Ur: 35.6 mg/dL — ABNORMAL HIGH (ref 0.0–1.9)

## 2014-04-17 LAB — COMPREHENSIVE METABOLIC PANEL
ALT: 15 U/L (ref 0–35)
AST: 19 U/L (ref 0–37)
Albumin: 3.6 g/dL (ref 3.5–5.2)
Alkaline Phosphatase: 63 U/L (ref 39–117)
BUN: 16 mg/dL (ref 6–23)
CO2: 27 mEq/L (ref 19–32)
Calcium: 9.8 mg/dL (ref 8.4–10.5)
Chloride: 96 mEq/L (ref 96–112)
Creatinine, Ser: 0.9 mg/dL (ref 0.4–1.2)
GFR: 69.24 mL/min (ref >60.00)
Glucose, Bld: 68 mg/dL — ABNORMAL LOW (ref 70–99)
Potassium: 4.6 mEq/L (ref 3.5–5.1)
Sodium: 131 mEq/L — ABNORMAL LOW (ref 135–145)
Total Bilirubin: 0.3 mg/dL (ref 0.2–1.2)
Total Protein: 7.2 g/dL (ref 6.0–8.3)

## 2014-04-17 LAB — VITAMIN B12: Vitamin B-12: 605 pg/mL (ref 211–911)

## 2014-04-17 MED ORDER — AMLODIPINE BESYLATE 10 MG PO TABS: 10.0000 mg | ORAL_TABLET | Freq: Every day | ORAL | Status: DC

## 2014-04-17 MED ORDER — CYANOCOBALAMIN 1000 MCG/ML IJ SOLN: INTRAMUSCULAR | Status: DC

## 2014-04-17 MED ORDER — ATORVASTATIN CALCIUM 20 MG PO TABS: 20.0000 mg | ORAL_TABLET | Freq: Every day | ORAL | Status: DC

## 2014-04-17 MED ORDER — CYANOCOBALAMIN 1000 MCG/ML IJ SOLN
1000.0000 ug | Freq: Once | INTRAMUSCULAR | Status: AC
  Administered 2014-04-17: 1000 ug via INTRAMUSCULAR

## 2014-04-17 MED ORDER — METOPROLOL TARTRATE 25 MG PO TABS: 25.0000 mg | ORAL_TABLET | Freq: Two times a day (BID) | ORAL | Status: DC

## 2014-04-17 MED ORDER — LISINOPRIL 5 MG PO TABS: 5.0000 mg | ORAL_TABLET | Freq: Every day | ORAL | Status: DC

## 2014-04-17 MED ORDER — LEVOTHYROXINE SODIUM 175 MCG PO TABS: 175.0000 ug | ORAL_TABLET | Freq: Every day | ORAL | Status: DC

## 2014-04-17 NOTE — Assessment & Plan Note (Signed)
Will check lipids with labs. Continue Atorvastatin. 

## 2014-04-17 NOTE — Progress Notes (Signed)
Pre visit review using our clinic review tool, if applicable. No additional management support is needed unless otherwise documented below in the visit note. 

## 2014-04-17 NOTE — Progress Notes (Signed)
Subjective:    Patient ID: Cathy Solis, female    DOB: 05-14-1941, 73 y.o.   MRN: [id number]  HPI 73YO female presents for follow up.  Kidney stone - Recently diagnosed with kidney stone. Dr. Jacqlyn Larsen recommended evaluation at [facility]. Lasix scan showed left UPJ obstruction, recommended follow up imaging in 6 months, which is scheduled. No recurrent flank pain or urinary symptoms.  Feeling more fatigued recently. Has not had any recent B12 injections. Would like to restart.   Review of Systems  Constitutional: Positive for fatigue. Negative for fever, chills, appetite change and unexpected weight change.  HENT: Negative for congestion, ear pain, sinus pressure, sore throat, trouble swallowing and voice change.   Eyes: Negative for visual disturbance.  Respiratory: Negative for cough, shortness of breath, wheezing and stridor.   Cardiovascular: Negative for chest pain, palpitations and leg swelling.  Gastrointestinal: Negative for nausea, vomiting, abdominal pain, diarrhea, constipation, blood in stool, abdominal distention and anal bleeding.  Genitourinary: Negative for dysuria and flank pain.  Musculoskeletal: Negative for arthralgias, gait problem, myalgias and neck pain.  Skin: Negative for color change and rash.  Neurological: Negative for dizziness and headaches.  Hematological: Negative for adenopathy. Does not bruise/bleed easily.  Psychiatric/Behavioral: Negative for suicidal ideas, sleep disturbance and dysphoric mood. The patient is not nervous/anxious.        Objective:    BP 118/70  Pulse 63  Temp(Src) 98.4 F (36.9 C) (Oral)  Ht _0  (1.651 m)  Wt 130 lb 4 oz (59.081 kg)  BMI 21.67 kg/m2  SpO2 95% Physical Exam  Constitutional: She is oriented to person, place, and time. She appears well-developed and well-nourished. No distress.  HENT:  Head: Normocephalic and atraumatic.  Right Ear: External ear normal.  Left Ear: External ear normal.  Nose: Nose normal.    Mouth/Throat: Oropharynx is clear and moist. No oropharyngeal exudate.  Eyes: Conjunctivae are normal. Pupils are equal, round, and reactive to light. Right eye exhibits no discharge. Left eye exhibits no discharge. No scleral icterus.  Neck: Normal range of motion. Neck supple. No tracheal deviation present. No thyromegaly present.  Cardiovascular: Normal rate, regular rhythm, normal heart sounds and intact distal pulses.  Exam reveals no gallop and no friction rub.   No murmur heard. Pulmonary/Chest: Effort normal and breath sounds normal. No accessory muscle usage. Not tachypneic. No respiratory distress. She has no decreased breath sounds. She has no wheezes. She has no rhonchi. She has no rales. She exhibits no tenderness.  Musculoskeletal: Normal range of motion. She exhibits no edema and no tenderness.  Lymphadenopathy:    She has no cervical adenopathy.  Neurological: She is alert and oriented to person, place, and time. No cranial nerve deficit. She exhibits normal muscle tone. Coordination normal.  Skin: Skin is warm and dry. No rash noted. She is not diaphoretic. No erythema. No pallor.  Psychiatric: She has a normal mood and affect. Her behavior is normal. Judgment and thought content normal.          Assessment & Plan:   Problem List Items Addressed This Visit     Unprioritized   Hyperlipidemia     Will check lipids with labs. Continue Atorvastatin.    Relevant Medications      metoprolol tartrate (LOPRESSOR) tablet      lisinopril (PRINIVIL,ZESTRIL) tablet      atorvastatin (LIPITOR) tablet      amLODIpine (NORVASC) tablet   Other Relevant Orders      Lipid Profile (  Completed)   Hypertension - Primary      BP Readings from Last 3 Encounters:  04/17/14 118/70  11/12/13 144/82  06/25/13 132/74   BP well controlled on current meds. Will check renal function with labs.    Relevant Medications      metoprolol tartrate (LOPRESSOR) tablet      lisinopril  (PRINIVIL,ZESTRIL) tablet      atorvastatin (LIPITOR) tablet      amLODIpine (NORVASC) tablet   Other Relevant Orders      Comp Met (CMET) (Completed)      Microalbumin / creatinine urine ratio (Completed)   Hypothyroidism     TSH normal today. Continue levothyroxine.    Relevant Medications      metoprolol tartrate (LOPRESSOR) tablet      levothyroxine (SYNTHROID, LEVOTHROID) tablet   Other Relevant Orders      TSH (Completed)   Intrinsic ureteral obstruction     Reviewed notes form [facility] with pt today. Plan for repeat urology evaluation in 6 months.    Pernicious anemia     Will restart monthly B12 injections.    Relevant Medications      cyanocobalamin (,VITAMIN B-12,) 1000 MCG/ML injection      cyanocobalamin ((VITAMIN B-12)) injection 1,000 mcg (Completed)   Other Relevant Orders      CBC w/Diff (Completed)      B12 (Completed)    Other Visit Diagnoses   B12 deficiency        Relevant Medications       cyanocobalamin ((VITAMIN B-12)) injection 1,000 mcg (Completed)        Return in about 6 months (around 10/18/2014) for Wellness Visit.

## 2014-04-17 NOTE — Assessment & Plan Note (Signed)
Reviewed notes form [facility] with pt today. Plan for repeat urology evaluation in 6 months.

## 2014-04-17 NOTE — Assessment & Plan Note (Signed)
Will restart monthly B12 injections.

## 2014-04-17 NOTE — Patient Instructions (Signed)
Please schedule monthly nurse visits for B12 injections.

## 2014-04-17 NOTE — Assessment & Plan Note (Signed)
BP Readings from Last 3 Encounters:  04/17/14 118/70  11/12/13 144/82  06/25/13 132/74   BP well controlled on current meds. Will check renal function with labs.

## 2014-04-17 NOTE — Assessment & Plan Note (Signed)
TSH normal today. Continue levothyroxine.

## 2014-04-18 ENCOUNTER — Telehealth: Payer: Self-pay | Admitting: Internal Medicine

## 2014-04-18 ENCOUNTER — Encounter: Payer: Self-pay | Admitting: *Deleted

## 2014-04-18 NOTE — Telephone Encounter (Signed)
Relevant patient education mailed to patient.  

## 2014-04-21 ENCOUNTER — Telehealth: Payer: Self-pay | Admitting: *Deleted

## 2014-04-21 DIAGNOSIS — Z1239 Encounter for other screening for malignant neoplasm of breast: Secondary | ICD-10-CM

## 2014-04-21 NOTE — Telephone Encounter (Signed)
Pt states that it is time for her mammogram and needs a referral

## 2014-05-21 ENCOUNTER — Ambulatory Visit: Payer: Self-pay | Admitting: Internal Medicine

## 2014-05-21 LAB — HM MAMMOGRAPHY: HM Mammogram: NEGATIVE

## 2014-05-22 ENCOUNTER — Encounter: Payer: Self-pay | Admitting: *Deleted

## 2014-05-27 ENCOUNTER — Ambulatory Visit (INDEPENDENT_AMBULATORY_CARE_PROVIDER_SITE_OTHER): Payer: Medicare PPO | Admitting: General Surgery

## 2014-05-27 ENCOUNTER — Encounter: Payer: Self-pay | Admitting: General Surgery

## 2014-05-27 ENCOUNTER — Other Ambulatory Visit: Payer: Medicare PPO

## 2014-05-27 VITALS — BP 110/70 | HR 66 | Resp 14 | Ht 66.0 in | Wt 128.0 lb

## 2014-05-27 DIAGNOSIS — I6529 Occlusion and stenosis of unspecified carotid artery: Secondary | ICD-10-CM

## 2014-05-27 NOTE — Progress Notes (Signed)
Patient ID: Cathy Solis, female   DOB: 24-Jul-1941, 73 y.o.   MRN: [id number]  Chief Complaint  Patient presents with  . Follow-up    1 year carotid doppler    HPI Cathy Solis is a 73 y.o. female who presents for a 1 year follow up carotid ultrasound. She denies any new problems at this time. She s/p left CEA. No neurological symptoms.  HPI  Past Medical History  Diagnosis Date  . Toe infection     followed by Dr. Jens Som  . Stroke     residual left sided weakness  . Hypertension   . 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 Kidney problem     Undeveloped R kidney  . Breast cancer     Left breast, s/p radiation  . 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 Abdominal hysterectomy    . Bladder repair    . Breast surgery      left  . Carotid endarterectomy  2008    left  . Kidney surgery  1949  . Eye surgery Right 2013    cataract  . Colonoscopy  2008    Family History  Problem Relation Age of Onset  . Stroke Mother   . Heart disease Father     Social History History  Substance Use Topics  . Smoking status: Current Every Day Smoker -- 1.00 packs/day for 30 years    Types: Cigarettes  . Smokeless tobacco: Never Used  . Alcohol Use: No    No Known Allergies  Current Outpatient Prescriptions  Medication Sig Dispense Refill  . amLODipine (NORVASC) 10 MG tablet Take 1 tablet (10 mg total) by mouth daily.  90 tablet  3  . aspirin 325 MG tablet Take 325 mg by mouth daily.        [name] atorvastatin (LIPITOR) 20 MG tablet Take 1 tablet (20 mg total) by mouth daily.  90 tablet  3  . cyanocobalamin (,VITAMIN B-12,) 1000 MCG/ML injection Inject 1 mL once a week for 3 weeks, after 3rd week inject 1 mL once a month  10 mL  3  . levothyroxine (SYNTHROID, LEVOTHROID) 175 MCG tablet Take 1 tablet (175 mcg total) by mouth daily.  90 tablet  3  . lisinopril (PRINIVIL,ZESTRIL) 5 MG tablet Take 1 tablet  (5 mg total) by mouth daily.  90 tablet  3  . metoprolol tartrate (LOPRESSOR) 25 MG tablet Take 1 tablet (25 mg total) by mouth 2 (two) times daily.  180 tablet  3   No current facility-administered medications for this visit.    Review of Systems Review of Systems  Constitutional: Negative.   Respiratory: Negative.   Cardiovascular: Negative.     Blood pressure 110/70, pulse 66, resp. rate 14, height 5\' 6"  (1.676 m), weight 128 lb (58.06 kg).  Physical Exam Physical Exam  Constitutional: She is oriented to person, place, and time. She appears well-developed and well-nourished.  Neurological: She is alert and oriented to person, place, and time.  Carotid Duplex study completed. Stable plaquing on right side.   Data Reviewed prior carotid doppler.  Assessment    Mild right carotid plaque, stable with 25% stenosis.     Plan   Recheck in 1 yr. Pt to call for any interval neurological symptoms.          SANKAR,SEEPLAPUTHUR G 05/30/2014, 10:44 AM

## 2014-05-27 NOTE — Patient Instructions (Signed)
Patient to return in 1 year for follow up. The patient is aware to call back for any questions or concerns.  

## 2014-05-28 LAB — HM COLONOSCOPY

## 2014-05-30 ENCOUNTER — Encounter: Payer: Self-pay | Admitting: General Surgery

## 2014-09-22 ENCOUNTER — Encounter: Payer: Self-pay | Admitting: General Surgery

## 2014-11-28 ENCOUNTER — Ambulatory Visit (INDEPENDENT_AMBULATORY_CARE_PROVIDER_SITE_OTHER): Payer: Medicare PPO | Admitting: Internal Medicine

## 2014-11-28 ENCOUNTER — Encounter: Payer: Self-pay | Admitting: Internal Medicine

## 2014-11-28 VITALS — BP 137/80 | HR 98 | Temp 97.4°F | Ht 65.0 in | Wt 126.2 lb

## 2014-11-28 DIAGNOSIS — E039 Hypothyroidism, unspecified: Secondary | ICD-10-CM

## 2014-11-28 DIAGNOSIS — I10 Essential (primary) hypertension: Secondary | ICD-10-CM

## 2014-11-28 DIAGNOSIS — M4120 Other idiopathic scoliosis, site unspecified: Secondary | ICD-10-CM

## 2014-11-28 DIAGNOSIS — Z23 Encounter for immunization: Secondary | ICD-10-CM

## 2014-11-28 DIAGNOSIS — Z780 Asymptomatic menopausal state: Secondary | ICD-10-CM

## 2014-11-28 DIAGNOSIS — Z720 Tobacco use: Secondary | ICD-10-CM

## 2014-11-28 DIAGNOSIS — E785 Hyperlipidemia, unspecified: Secondary | ICD-10-CM

## 2014-11-28 LAB — MICROALBUMIN / CREATININE URINE RATIO
Creatinine,U: 48.9 mg/dL
Microalb Creat Ratio: 192.3 mg/g — ABNORMAL HIGH (ref 0.0–30.0)
Microalb, Ur: 94 mg/dL — ABNORMAL HIGH (ref 0.0–1.9)

## 2014-11-28 LAB — COMPREHENSIVE METABOLIC PANEL
ALT: 13 U/L (ref 0–35)
AST: 21 U/L (ref 0–37)
Albumin: 3.8 g/dL (ref 3.5–5.2)
Alkaline Phosphatase: 68 U/L (ref 39–117)
BUN: 21 mg/dL (ref 6–23)
CO2: 27 mEq/L (ref 19–32)
Calcium: 10.2 mg/dL (ref 8.4–10.5)
Chloride: 102 mEq/L (ref 96–112)
Creatinine, Ser: 1.1 mg/dL (ref 0.4–1.2)
GFR: 53 mL/min — ABNORMAL LOW (ref >60.00)
Glucose, Bld: 74 mg/dL (ref 70–99)
Potassium: 4.6 mEq/L (ref 3.5–5.1)
Sodium: 140 mEq/L (ref 135–145)
Total Bilirubin: 0.6 mg/dL (ref 0.2–1.2)
Total Protein: 7.3 g/dL (ref 6.0–8.3)

## 2014-11-28 LAB — TSH: TSH: 6.67 u[IU]/mL — ABNORMAL HIGH (ref 0.35–4.50)

## 2014-11-28 LAB — LIPID PANEL
Cholesterol: 248 mg/dL — ABNORMAL HIGH (ref 0–200)
HDL: 80 mg/dL (ref >39.00)
LDL Cholesterol: 144 mg/dL — ABNORMAL HIGH (ref 0–99)
NonHDL: 168
Total CHOL/HDL Ratio: 3
Triglycerides: 118 mg/dL (ref 0.0–149.0)
VLDL: 23.6 mg/dL (ref 0.0–40.0)

## 2014-11-28 MED ORDER — ATORVASTATIN CALCIUM 20 MG PO TABS: 20.0000 mg | ORAL_TABLET | Freq: Every day | ORAL | Status: DC

## 2014-11-28 MED ORDER — METOPROLOL TARTRATE 25 MG PO TABS: 25.0000 mg | ORAL_TABLET | Freq: Two times a day (BID) | ORAL | Status: DC

## 2014-11-28 MED ORDER — LISINOPRIL 5 MG PO TABS: 5.0000 mg | ORAL_TABLET | Freq: Every day | ORAL | Status: DC

## 2014-11-28 MED ORDER — TRAMADOL HCL 50 MG PO TABS
50.0000 mg | ORAL_TABLET | Freq: Four times a day (QID) | ORAL | Status: DC | PRN
Start: 2014-11-28 — End: 2016-06-07

## 2014-11-28 MED ORDER — LEVOTHYROXINE SODIUM 175 MCG PO TABS: 175.0000 ug | ORAL_TABLET | Freq: Every day | ORAL | Status: DC

## 2014-11-28 MED ORDER — AMLODIPINE BESYLATE 10 MG PO TABS: 10.0000 mg | ORAL_TABLET | Freq: Every day | ORAL | Status: DC

## 2014-11-28 NOTE — Progress Notes (Signed)
Pre visit review using our clinic review tool, if applicable. No additional management support is needed unless otherwise documented below in the visit note. 

## 2014-11-28 NOTE — Addendum Note (Signed)
Addended by: Vernetta Honey on: 11/28/2014 10:18 AM   Modules accepted: Orders

## 2014-11-28 NOTE — Assessment & Plan Note (Signed)
Will set up screening CT chest. Encouraged smoking cessation.

## 2014-11-28 NOTE — Assessment & Plan Note (Signed)
Will set up bone density testing. 

## 2014-11-28 NOTE — Assessment & Plan Note (Signed)
Will check lipids with labs. Continue Atorvastatin. 

## 2014-11-28 NOTE — Assessment & Plan Note (Signed)
Will check TSH with labs. Continue Levothyroxine. 

## 2014-11-28 NOTE — Progress Notes (Signed)
Subjective:    Patient ID: MAZEY MANTELL, female    DOB: 28-Mar-1941, 74 y.o.   MRN: [id number]  HPI 74YO female presents for follow up.  Generally feeling well. Has been riding stationary bike daily at home. Follows healthy diet.  Continues to have some aching back pain at times. Taking Tramadol prn for this with improvement.    Past medical, surgical, family and social history per today's encounter.  Review of Systems  Constitutional: Negative for fever, chills, appetite change, fatigue and unexpected weight change.  Eyes: Negative for visual disturbance.  Respiratory: Negative for shortness of breath.   Cardiovascular: Negative for chest pain and leg swelling.  Gastrointestinal: Negative for nausea, vomiting, abdominal pain, diarrhea and constipation.  Musculoskeletal: Positive for myalgias, back pain and arthralgias.  Skin: Negative for color change and rash.  Hematological: Negative for adenopathy. Does not bruise/bleed easily.  Psychiatric/Behavioral: Negative for dysphoric mood. The patient is not nervous/anxious.        Objective:    BP 137/80 mmHg  Pulse 98  Temp(Src) 97.4 F (36.3 C) (Oral)  Ht 5\' 5"  (1.651 m)  Wt 126 lb 4 oz (57.267 kg)  BMI 21.01 kg/m2  SpO2 99% Physical Exam  Constitutional: She is oriented to person, place, and time. She appears well-developed and well-nourished. No distress.  HENT:  Head: Normocephalic and atraumatic.  Right Ear: External ear normal.  Left Ear: External ear normal.  Nose: Nose normal.  Mouth/Throat: Oropharynx is clear and moist. No oropharyngeal exudate.  Eyes: Conjunctivae are normal. Pupils are equal, round, and reactive to light. Right eye exhibits no discharge. Left eye exhibits no discharge. No scleral icterus.  Neck: Normal range of motion. Neck supple. No tracheal deviation present. No thyromegaly present.  Cardiovascular: Normal rate, regular rhythm, normal heart sounds and intact distal pulses.  Exam reveals  no gallop and no friction rub.   No murmur heard. Pulmonary/Chest: Effort normal and breath sounds normal. No accessory muscle usage. No tachypnea. No respiratory distress. She has no decreased breath sounds. She has no wheezes. She has no rhonchi. She has no rales. She exhibits no tenderness.  Musculoskeletal: She exhibits no edema or tenderness.       Thoracic back: She exhibits decreased range of motion, deformity (scoliosis) and pain.  Lymphadenopathy:    She has no cervical adenopathy.  Neurological: She is alert and oriented to person, place, and time. No cranial nerve deficit. She exhibits normal muscle tone. Coordination normal.  Skin: Skin is warm and dry. No rash noted. She is not diaphoretic. No erythema. No pallor.  Psychiatric: She has a normal mood and affect. Her behavior is normal. Judgment and thought content normal.          Assessment & Plan:   Problem List Items Addressed This Visit      Unprioritized   Hyperlipidemia    Will check lipids with labs. Continue Atorvastatin.    Relevant Medications      amLODIpine (NORVASC) tablet      atorvastatin (LIPITOR) tablet      lisinopril (PRINIVIL,ZESTRIL) tablet      metoprolol tartrate (LOPRESSOR) tablet   Other Relevant Orders      Lipid panel   Hypertension - Primary    BP Readings from Last 3 Encounters:  11/28/14 137/80  05/27/14 110/70  04/17/14 118/70   BP well controlled on current medications. Renal function with labs today.    Relevant Medications      amLODIpine (NORVASC) tablet  atorvastatin (LIPITOR) tablet      lisinopril (PRINIVIL,ZESTRIL) tablet      metoprolol tartrate (LOPRESSOR) tablet   Other Relevant Orders      Comprehensive metabolic panel      Microalbumin / creatinine urine ratio   Hypothyroidism    Will check TSH with labs. Continue Levothyroxine.    Relevant Medications      levothyroxine (SYNTHROID, LEVOTHROID) tablet      metoprolol tartrate (LOPRESSOR) tablet   Other  Relevant Orders      TSH   Idiopathic scoliosis    Will continue prn Tramadol for severe pain.    Postmenopausal estrogen deficiency    Will set up bone density testing.    Relevant Orders      DG Bone Density   Tobacco abuse    Will set up screening CT chest. Encouraged smoking cessation.    Relevant Orders      CT CHEST LOW DOSE SCREENING W/O CM       Return for Wellness Visit.

## 2014-11-28 NOTE — Assessment & Plan Note (Signed)
Will continue prn Tramadol for severe pain.

## 2014-11-28 NOTE — Assessment & Plan Note (Signed)
BP Readings from Last 3 Encounters:  11/28/14 137/80  05/27/14 110/70  04/17/14 118/70   BP well controlled on current medications. Renal function with labs today.

## 2014-11-28 NOTE — Patient Instructions (Signed)
Labs today.  Follow up in 6 months for Wellness Visit.

## 2014-12-01 ENCOUNTER — Telehealth: Payer: Self-pay | Admitting: *Deleted

## 2014-12-01 ENCOUNTER — Other Ambulatory Visit (INDEPENDENT_AMBULATORY_CARE_PROVIDER_SITE_OTHER): Payer: Medicare PPO

## 2014-12-01 DIAGNOSIS — N179 Acute kidney failure, unspecified: Secondary | ICD-10-CM

## 2014-12-01 LAB — BASIC METABOLIC PANEL
BUN: 23 mg/dL (ref 6–23)
CO2: 24 mEq/L (ref 19–32)
Calcium: 9.1 mg/dL (ref 8.4–10.5)
Chloride: 96 mEq/L (ref 96–112)
Creatinine, Ser: 1.1 mg/dL (ref 0.4–1.2)
GFR: 52.43 mL/min — ABNORMAL LOW (ref >60.00)
Glucose, Bld: 100 mg/dL — ABNORMAL HIGH (ref 70–99)
Potassium: 3.8 mEq/L (ref 3.5–5.1)
Sodium: 130 mEq/L — ABNORMAL LOW (ref 135–145)

## 2014-12-01 NOTE — Telephone Encounter (Signed)
What labs and dx?  

## 2014-12-01 NOTE — Telephone Encounter (Signed)
BMP for acute renal failure 

## 2014-12-10 ENCOUNTER — Ambulatory Visit: Payer: Self-pay | Admitting: Hospice and Palliative Medicine

## 2015-06-04 ENCOUNTER — Ambulatory Visit: Payer: Medicare PPO | Admitting: General Surgery

## 2015-06-04 ENCOUNTER — Other Ambulatory Visit: Payer: Medicare PPO

## 2015-06-04 ENCOUNTER — Ambulatory Visit (INDEPENDENT_AMBULATORY_CARE_PROVIDER_SITE_OTHER): Payer: Medicare PPO | Admitting: General Surgery

## 2015-06-04 VITALS — BP 124/70 | HR 66 | Resp 14 | Ht 65.0 in | Wt 122.0 lb

## 2015-06-04 DIAGNOSIS — I6529 Occlusion and stenosis of unspecified carotid artery: Secondary | ICD-10-CM | POA: Diagnosis not present

## 2015-06-04 NOTE — Progress Notes (Signed)
Patient ID: Cathy Solis, female   DOB: 02-Aug-1941, 74 y.o.   MRN: [id number]  Chief Complaint  Patient presents with  . Follow-up    carotid doppler    HPI Cathy Solis is a 74 y.o. female  who presents for a 1 year follow up carotid ultrasound. She denies any new problems at this time. She s/p left CEA. No neurological symptoms. HPI  Past Medical History  Diagnosis Date  . Toe infection     followed by Dr. Jens Som  . Stroke     residual left sided weakness  . Hypertension   . 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 Kidney problem     Undeveloped R kidney  . Breast cancer     Left breast, s/p radiation  . 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 Abdominal hysterectomy    . Bladder repair    . Breast surgery      left  . Carotid endarterectomy  2008    left  . Kidney surgery  1949  . Eye surgery Right 2013    cataract  . Colonoscopy  2008    Family History  Problem Relation Age of Onset  . Stroke Mother   . Heart disease Father     Social History History  Substance Use Topics  . Smoking status: Current Every Day Smoker -- 1.00 packs/day for 30 years    Types: Cigarettes  . Smokeless tobacco: Never Used  . Alcohol Use: No    No Known Allergies  Current Outpatient Prescriptions  Medication Sig Dispense Refill  . amLODipine (NORVASC) 10 MG tablet Take 1 tablet (10 mg total) by mouth daily. 90 tablet 3  . aspirin 325 MG tablet Take 325 mg by mouth daily.      [name] atorvastatin (LIPITOR) 20 MG tablet Take 1 tablet (20 mg total) by mouth daily. 90 tablet 3  . cyanocobalamin (,VITAMIN B-12,) 1000 MCG/ML injection Inject 1 mL once a week for 3 weeks, after 3rd week inject 1 mL once a month 10 mL 3  . levothyroxine (SYNTHROID, LEVOTHROID) 175 MCG tablet Take 1 tablet (175 mcg total) by mouth daily. 90 tablet 3  . lisinopril (PRINIVIL,ZESTRIL) 5 MG tablet Take 1 tablet (5 mg total) by  mouth daily. 90 tablet 3  . metoprolol tartrate (LOPRESSOR) 25 MG tablet Take 1 tablet (25 mg total) by mouth 2 (two) times daily. 180 tablet 3  . traMADol (ULTRAM) 50 MG tablet Take 1 tablet (50 mg total) by mouth every 6 (six) hours as needed. 90 tablet 2   No current facility-administered medications for this visit.    Review of Systems Review of Systems  Constitutional: Negative.   Respiratory: Negative.   Cardiovascular: Negative.     Blood pressure 124/70, pulse 66, resp. rate 14, height 5\' 5"  (1.651 m), weight 122 lb (55.339 kg).  Physical Exam Physical Exam  Constitutional: She is oriented to person, place, and time. She appears well-developed and well-nourished.  Eyes: Pupils are equal, round, and reactive to light.  Cardiovascular:  Pulses:      Carotid pulses are 2+ on the right side, and 2+ on the left side. Neurological: She is alert and oriented to person, place, and time.    Data Reviewed None  Assessment    Carotid ultrasound - stable mild plaquing in right carotid with about 25% stenosis in right  ICA    Plan    Patient to return in one year carotid ultrasound     PCP:  Johnny Bridge 06/08/2015, 8:07 AM

## 2015-06-08 ENCOUNTER — Encounter: Payer: Self-pay | Admitting: General Surgery

## 2015-06-24 ENCOUNTER — Ambulatory Visit (INDEPENDENT_AMBULATORY_CARE_PROVIDER_SITE_OTHER): Payer: Medicare PPO | Admitting: Internal Medicine

## 2015-06-24 ENCOUNTER — Encounter: Payer: Self-pay | Admitting: Internal Medicine

## 2015-06-24 ENCOUNTER — Telehealth: Payer: Self-pay | Admitting: Internal Medicine

## 2015-06-24 VITALS — BP 140/63 | HR 47 | Temp 97.6°F | Ht 65.0 in | Wt 123.5 lb

## 2015-06-24 DIAGNOSIS — R5382 Chronic fatigue, unspecified: Secondary | ICD-10-CM

## 2015-06-24 DIAGNOSIS — M4120 Other idiopathic scoliosis, site unspecified: Secondary | ICD-10-CM

## 2015-06-24 DIAGNOSIS — E785 Hyperlipidemia, unspecified: Secondary | ICD-10-CM

## 2015-06-24 DIAGNOSIS — E039 Hypothyroidism, unspecified: Secondary | ICD-10-CM

## 2015-06-24 DIAGNOSIS — Z79899 Other long term (current) drug therapy: Secondary | ICD-10-CM | POA: Diagnosis not present

## 2015-06-24 DIAGNOSIS — Z1231 Encounter for screening mammogram for malignant neoplasm of breast: Secondary | ICD-10-CM

## 2015-06-24 DIAGNOSIS — I10 Essential (primary) hypertension: Secondary | ICD-10-CM | POA: Diagnosis not present

## 2015-06-24 DIAGNOSIS — N135 Crossing vessel and stricture of ureter without hydronephrosis: Secondary | ICD-10-CM | POA: Diagnosis not present

## 2015-06-24 LAB — CBC WITH DIFFERENTIAL/PLATELET
Basophils Absolute: 0.1 10*3/uL (ref 0.0–0.1)
Basophils Relative: 0.9 % (ref 0.0–3.0)
Eosinophils Absolute: 0.3 10*3/uL (ref 0.0–0.7)
Eosinophils Relative: 3.3 % (ref 0.0–5.0)
HCT: 40.1 % (ref 36.0–46.0)
Hemoglobin: 13.4 g/dL (ref 12.0–15.0)
Lymphocytes Relative: 19.1 % (ref 12.0–46.0)
Lymphs Abs: 1.6 10*3/uL (ref 0.7–4.0)
MCHC: 33.4 g/dL (ref 30.0–36.0)
MCV: 92.9 fl (ref 78.0–100.0)
Monocytes Absolute: 0.8 10*3/uL (ref 0.1–1.0)
Monocytes Relative: 10.4 % (ref 3.0–12.0)
Neutro Abs: 5.4 10*3/uL (ref 1.4–7.7)
Neutrophils Relative %: 66.3 % (ref 43.0–77.0)
Platelets: 272 10*3/uL (ref 150.0–400.0)
RBC: 4.32 Mil/uL (ref 3.87–5.11)
RDW: 13.3 % (ref 11.5–15.5)
WBC: 8.2 10*3/uL (ref 4.0–10.5)

## 2015-06-24 LAB — TSH: TSH: 1.19 u[IU]/mL (ref 0.35–4.50)

## 2015-06-24 LAB — COMPREHENSIVE METABOLIC PANEL
ALT: 11 U/L (ref 0–35)
AST: 16 U/L (ref 0–37)
Albumin: 3.5 g/dL (ref 3.5–5.2)
Alkaline Phosphatase: 60 U/L (ref 39–117)
BUN: 16 mg/dL (ref 6–23)
CO2: 28 mEq/L (ref 19–32)
Calcium: 9.3 mg/dL (ref 8.4–10.5)
Chloride: 99 mEq/L (ref 96–112)
Creatinine, Ser: 0.84 mg/dL (ref 0.40–1.20)
GFR: 69.97 mL/min (ref >60.00)
Glucose, Bld: 78 mg/dL (ref 70–99)
Potassium: 3.7 mEq/L (ref 3.5–5.1)
Sodium: 135 mEq/L (ref 135–145)
Total Bilirubin: 0.3 mg/dL (ref 0.2–1.2)
Total Protein: 6.3 g/dL (ref 6.0–8.3)

## 2015-06-24 LAB — LIPID PANEL
Cholesterol: 180 mg/dL (ref 0–200)
HDL: 69 mg/dL (ref >39.00)
LDL Cholesterol: 84 mg/dL (ref 0–99)
NonHDL: 111.31
Total CHOL/HDL Ratio: 3
Triglycerides: 136 mg/dL (ref 0.0–149.0)
VLDL: 27.2 mg/dL (ref 0.0–40.0)

## 2015-06-24 LAB — VITAMIN B12: Vitamin B-12: 798 pg/mL (ref 211–911)

## 2015-06-24 NOTE — Assessment & Plan Note (Signed)
Will check TSH with labs. Continue Levothyroxine. 

## 2015-06-24 NOTE — Patient Instructions (Signed)
Labs today.  Follow up in 4 weeks. 

## 2015-06-24 NOTE — Assessment & Plan Note (Signed)
BP Readings from Last 3 Encounters:  06/24/15 140/63  06/04/15 124/70  11/28/14 137/80   BP well controlled. Renal function with labs. Continue current medications.

## 2015-06-24 NOTE — Progress Notes (Signed)
Subjective:    Patient ID: Cathy Solis, female    DOB: 14-Mar-1941, 74 y.o.   MRN: [id number]  HPI  74YO female presents for follow up.  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 Recommended repeat Cr today with labs.  Fatigue - Feels that B12 not working. Feels exhausted. No dyspnea, chest pain. No NVD. No change in cough.  BP Readings from Last 3 Encounters:  06/24/15 140/63  06/04/15 124/70  11/28/14 137/80     Wt Readings from Last 3 Encounters:  06/24/15 123 lb 8 oz (56.019 kg)  06/04/15 122 lb (55.339 kg)  11/28/14 126 lb 4 oz (57.267 kg)     Past medical, surgical, family and social history per today's encounter.   Review of Systems  Constitutional: Positive for fatigue. Negative for fever, chills, appetite change and unexpected weight change.  Eyes: Negative for visual disturbance.  Respiratory: Negative for cough, shortness of breath and wheezing.   Cardiovascular: Negative for chest pain and leg swelling.  Gastrointestinal: Negative for nausea, vomiting, abdominal pain, diarrhea, constipation and blood in stool.  Musculoskeletal: Negative for myalgias and arthralgias.  Skin: Negative for color change and rash.  Hematological: Negative for adenopathy. Does not bruise/bleed easily.  Psychiatric/Behavioral: Negative for dysphoric mood. The patient is not nervous/anxious.        Objective:    BP 140/63 mmHg  Pulse 47  Temp(Src) 97.6 F (36.4 C) (Oral)  Ht 5\' 5"  (1.651 m)  Wt 123 lb 8 oz (56.019 kg)  BMI 20.55 kg/m2  SpO2 97% Physical Exam  Constitutional: She is oriented to person, place, and time. She appears well-developed and well-nourished. No distress.  HENT:  Head: Normocephalic and atraumatic.  Right Ear: External ear normal.  Left Ear: External ear normal.  Nose: Nose normal.  Mouth/Throat: Oropharynx is clear and moist. No oropharyngeal exudate.  Eyes: Conjunctivae are normal. Pupils are equal, round, and reactive to light. Right eye  exhibits no discharge. Left eye exhibits no discharge. No scleral icterus.  Neck: Normal range of motion. Neck supple. No tracheal deviation present. No thyromegaly present.  Cardiovascular: Normal rate, regular rhythm, normal heart sounds and intact distal pulses.  Exam reveals no gallop and no friction rub.   No murmur heard. Pulmonary/Chest: Effort normal and breath sounds normal. No respiratory distress. She has no wheezes. She has no rales. She exhibits no tenderness.  Musculoskeletal: Normal range of motion. She exhibits no edema or tenderness.       Thoracic back: She exhibits deformity (scoliosis).  Lymphadenopathy:    She has no cervical adenopathy.  Neurological: She is alert and oriented to person, place, and time. No cranial nerve deficit. She exhibits normal muscle tone. Coordination normal.  Skin: Skin is warm and dry. No rash noted. She is not diaphoretic. No erythema. No pallor.  Psychiatric: She has a normal mood and affect. Her behavior is normal. Judgment and thought content normal.          Assessment & Plan:   Problem List Items Addressed This Visit      Unprioritized   Chronic fatigue    Chronic fatigue. Recently worsening. Will check CBC, CMP, B12, TSH with labs.      Relevant Orders   CBC with Differential/Platelet   B12   Hyperlipidemia    Will check lipids with labs. Continue Atorvastatin.      Relevant Orders   Lipid panel   Hypertension    BP Readings from Last 3 Encounters:  06/24/15 140/63  06/04/15 124/70  11/28/14 137/80   BP well controlled. Renal function with labs. Continue current medications.      Hypothyroidism    Will check TSH with labs. Continue Levothyroxine.      Relevant Orders   TSH   Idiopathic scoliosis    Continue prn Tramadol for pain.      Intrinsic ureteral obstruction - Primary    Reviewed notes from Urology. Will check Cr today.      Relevant Orders   Comprehensive metabolic panel   Visit for screening  mammogram    Mammogram ordered.      Relevant Orders   MM Digital Screening       Return in about 4 weeks (around 07/22/2015) for Recheck.

## 2015-06-24 NOTE — Assessment & Plan Note (Signed)
Chronic fatigue. Recently worsening. Will check CBC, CMP, B12, TSH with labs.

## 2015-06-24 NOTE — Telephone Encounter (Signed)
Reviewed CT chest from 11/2014. They recommended repeat CT in 1 year. CT noted diffuse bronchial wall thickening consistent with emphysema. If she continues to feel more fatigued, it might be helpful to start a long-acting bronchodilator to help.

## 2015-06-24 NOTE — Assessment & Plan Note (Signed)
Mammogram ordered

## 2015-06-24 NOTE — Progress Notes (Signed)
Pre visit review using our clinic review tool, if applicable. No additional management support is needed unless otherwise documented below in the visit note. 

## 2015-06-24 NOTE — Assessment & Plan Note (Signed)
Will check lipids with labs. Continue Atorvastatin. 

## 2015-06-24 NOTE — Assessment & Plan Note (Signed)
Continue prn Tramadol for pain.

## 2015-06-24 NOTE — Assessment & Plan Note (Signed)
Reviewed notes from Urology. Will check Cr today.

## 2015-06-25 NOTE — Telephone Encounter (Signed)
Notiifed pt

## 2015-06-30 ENCOUNTER — Ambulatory Visit (INDEPENDENT_AMBULATORY_CARE_PROVIDER_SITE_OTHER): Payer: Medicare PPO

## 2015-06-30 VITALS — BP 128/68 | HR 60 | Temp 98.0°F | Resp 14 | Ht 64.0 in | Wt 124.4 lb

## 2015-06-30 DIAGNOSIS — Z0000 Encounter for general adult medical examination without abnormal findings: Secondary | ICD-10-CM

## 2015-06-30 NOTE — Progress Notes (Signed)
Annual Wellness Visit as completed by Health Coach was reviewed in full.  

## 2015-06-30 NOTE — Patient Instructions (Addendum)
Mrs. Cathy Solis,  Thank you for taking time to come for your Medicare Wellness Visit.  I appreciate your ongoing commitment to your health goals. Please review the following plan we discussed and let me know if I can assist you in the future. Bone Densitometry Bone densitometry is a special X-ray that measures your bone density and can be used to help predict your risk of bone fractures. This test is used to determine bone mineral content and density to diagnose osteoporosis. Osteoporosis is the loss of bone that may cause the bone to become weak. Osteoporosis commonly occurs in women entering menopause. However, it may be found in men and in people with other diseases. PREPARATION FOR TEST No preparation necessary. WHO SHOULD BE TESTED?  All women older than 95.  Postmenopausal women (50 to 54) with risk factors for osteoporosis.  People with a previous fracture caused by normal activities.  People with a small body frame (less than 127 poundsor a body mass index [BMI] of less than 21).  People who have a parent with a hip fracture or history of osteoporosis.  People who smoke.  People who have rheumatoid arthritis.  Anyone who engages in excessive alcohol use (more than 3 drinks most days).  Women who experience early menopause. WHEN SHOULD YOU BE RETESTED? Current guidelines suggest that you should wait at least 2 years before doing a bone density test again if your first test was normal.Recent studies indicated that women with normal bone density may be able to wait a few years before needing to repeat a bone density test. You should discuss this with your caregiver.  NORMAL FINDINGS   Normal: less than standard deviation below normal (greater than -1).  Osteopenia: 1 to 2.5 standard deviations below normal (-1 to -2.5).  Osteoporosis: greater than 2.5 standard deviations below normal (less than -2.5). Test results are reported as a "T score" and a "Z score."The T score is  a number that compares your bone density with the bone density of healthy, young women.The Z score is a number that compares your bone density with the scores of women who are the same age, gender, and race.  Ranges for normal findings may vary among different laboratories and hospitals. You should always check with your doctor after having lab work or other tests done to discuss the meaning of your test results and whether your values are considered within normal limits. MEANING OF TEST  Your caregiver will go over the test results with you and discuss the importance and meaning of your results, as well as treatment options and the need for additional tests if necessary. OBTAINING THE TEST RESULTS It is your responsibility to obtain your test results. Ask the lab or department performing the test when and how you will get your results. Document Released: 11/29/2004 Document Revised: 01/30/2012 Document Reviewed: 12/22/2010 [facility] Patient Information 2015 [place], [place]. This information is not intended to replace advice given to you by your health care provider. Make sure you discuss any questions you have with your health care provider.  Fall Prevention and Home Safety Falls cause injuries and can affect all age groups. It is possible to prevent falls.  HOW TO PREVENT FALLS  Wear shoes with rubber soles that do not have an opening for your toes.  Keep the inside and outside of your house well lit.  Use night lights throughout your home.  Remove clutter from floors.  Clean up floor spills.  Remove throw rugs or fasten  them to the floor with carpet tape.  Do not place electrical cords across pathways.  Put grab bars by your tub, shower, and toilet. Do not use towel bars as grab bars.  Put handrails on both sides of the stairway. Fix loose handrails.  Do not climb on stools or stepladders, if possible.  Do not wax your floors.  Repair uneven or unsafe sidewalks, walkways, or  stairs.  Keep items you use a lot within reach.  Be aware of pets.  Keep emergency numbers next to the telephone.  Put smoke detectors in your home and near bedrooms. Ask your doctor what other things you can do to prevent falls. Document Released: 09/03/2009 Document Revised: 05/08/2012 Document Reviewed: 02/07/2012 [facility] Patient Information 2015 [place], [place]. This information is not intended to replace advice given to you by your health care provider. Make sure you discuss any questions you have with your health care provider.  Hearing Loss A hearing loss is sometimes called deafness. Hearing loss may be partial or total. CAUSES Hearing loss may be caused by:  Wax in the ear canal.  Infection of the ear canal.  Infection of the middle ear.  Trauma to the ear or surrounding area.  Fluid in the middle ear.  A hole in the eardrum (perforated eardrum).  Exposure to loud sounds or music.  Problems with the hearing nerve.  Certain medications. Hearing loss without wax, infection, or a history of injury may mean that the nerve is involved. Hearing loss with severe dizziness, nausea and vomiting or ringing in the ear may suggest a hearing nerve irritation or problems in the middle or inner ear. If hearing loss is untreated, there is a greater likelihood for residual or permanent hearing loss. DIAGNOSIS A hearing test (audiometry) assesses hearing loss. The audiometry test needs to be performed by a hearing specialist (audiologist). TREATMENT Treatment for recent onset of hearing loss may include:  Ear wax removal.  Medications that kill germs (antibiotics).  Cortisone medications.  Prompt follow up with the appropriate specialist. Return of hearing depends on the cause of your hearing loss, so proper medical follow-up is important. Some hearing loss may not be reversible, and a caregiver should discuss care and treatment options with you. SEEK MEDICAL CARE IF:   You  have a severe headache, dizziness, or changes in vision.  You have new or increased weakness.  You develop repeated vomiting or other serious medical problems.  You have a fever. Document Released: 11/07/2005 Document Revised: 01/30/2012 Document Reviewed: 03/04/2010 [facility] Patient Information 2015 [place], [place]. This information is not intended to replace advice given to you by your health care provider. Make sure you discuss any questions you have with your health care provider.  Mammography Mammography is an X-ray of the breasts to look for changes that are not normal. The X-ray image is called a mammogram. This procedure can screen for breast cancer, can detect cancer early, and can diagnose cancer.  LET YOUR CAREGIVER KNOW ABOUT:  Breast implants.  Previous breast disease, biopsy, or surgery.  If you are breastfeeding.  Medicines taken, including vitamins, herbs, eyedrops, over-the-counter medicines, and creams.  Use of steroids (by mouth or creams).  Possibility of pregnancy, if this applies. RISKS AND COMPLICATIONS  Exposure to radiation, but at very low levels.  The results may be misinterpreted.  The results may not be accurate.  Mammography may lead to further tests.  Mammography may not catch certain cancers. BEFORE THE PROCEDURE  Schedule your test  about 7 days after your menstrual period. This is when your breasts are the least tender and have signs of hormone changes.  If you have had a mammography done at a different facility in the past, get the mammogram X-rays or have them sent to your current exam facility in order to compare them.  Wash your breasts and under your arms the day of the test.  Do not wear deodorants, perfumes, or powders anywhere on your body.  Wear clothes that you can change in and out of easily. PROCEDURE Relax as much as possible during the test. Any discomfort during the test will be very brief. The test should take less than  30 minutes. The following will happen:  You will undress from the waist up and put on a gown.  You will stand in front of the X-ray machine.  Each breast will be placed between 2 plastic or glass plates. The plates will compress your breast for a few seconds.  X-rays will be taken from different angles of the breast. AFTER THE PROCEDURE  The mammogram will be examined.  Depending on the quality of the images, you may need to repeat certain parts of the test.  Ask when your test results will be ready. Make sure you get your test results.  You may resume normal activities. Document Released: 11/04/2000 Document Revised: 01/30/2012 Document Reviewed: 08/28/2011 [facility] Patient Information 2015 [place], [place]. This information is not intended to replace advice given to you by your health care provider. Make sure you discuss any questions you have with your health care provider.  You Can Quit Smoking If you are ready to quit smoking or are thinking about it, congratulations! You have chosen to help yourself be healthier and live longer! There are lots of different ways to quit smoking. Nicotine gum, nicotine patches, a nicotine inhaler, or nicotine nasal spray can help with physical craving. Hypnosis, support groups, and medicines help break the habit of smoking. TIPS TO GET OFF AND STAY OFF CIGARETTES  Learn to predict your moods. Do not let a bad situation be your excuse to have a cigarette. Some situations in your life might tempt you to have a cigarette.  Ask friends and co-workers not to smoke around you.  Make your home smoke-free.  Never have "just one" cigarette. It leads to wanting another and another. Remind yourself of your decision to quit.  On a card, make a list of your reasons for not smoking. Read it at least the same number of times a day as you have a cigarette. Tell yourself everyday, "I do not want to smoke. I choose not to smoke."  Ask someone at home or work to  help you with your plan to quit smoking.  Have something planned after you eat or have a cup of coffee. Take a walk or get other exercise to perk you up. This will help to keep you from overeating.  Try a relaxation exercise to calm you down and decrease your stress. Remember, you may be tense and nervous the first two weeks after you quit. This will pass.  Find new activities to keep your hands busy. Play with a pen, coin, or rubber band. Doodle or draw things on paper.  Brush your teeth right after eating. This will help cut down the craving for the taste of tobacco after meals. You can try mouthwash too.  Try gum, breath mints, or diet candy to keep something in your mouth. IF YOU SMOKE AND  WANT TO QUIT:  Do not stock up on cigarettes. Never buy a carton. Wait until one pack is finished before you buy another.  Never carry cigarettes with you at work or at home.  Keep cigarettes as far away from you as possible. Leave them with someone else.  Never carry matches or a lighter with you.  Ask yourself, "Do I need this cigarette or is this just a reflex?"  Bet with someone that you can quit. Put cigarette money in a piggy bank every morning. If you smoke, you give up the money. If you do not smoke, by the end of the week, you keep the money.  Keep trying. It takes 21 days to change a habit!  Talk to your doctor about using medicines to help you quit. These include nicotine replacement gum, lozenges, or skin patches. Document Released: 09/03/2009 Document Revised: 01/30/2012 Document Reviewed: 09/03/2009 [facility] Patient Information 2015 [place], [place]. This information is not intended to replace advice given to you by your health care provider. Make sure you discuss any questions you have with your health care provider.

## 2015-06-30 NOTE — Progress Notes (Signed)
Subjective:   Cathy Solis is a 74 y.o. female who presents for Medicare Annual (Subsequent) preventive examination.  Review of Systems: No ROS.  Medicare Wellness Visit.   Cardiac Risk Factors include: advanced age (>66men, >52 women)     Objective:   The goal of the wellness visit is to assist the patient how to close the gaps in care and create a preventative care plan for the patient.   Vitals: BP 128/68 mmHg  Pulse 60  Temp(Src) 98 F (36.7 C) (Oral)  Resp 14  Ht 5\' 4"  (1.626 m)  Wt 124 lb 6.4 oz (56.427 kg)  BMI 21.34 kg/m2  SpO2 99%  LMP   Tobacco History  Smoking status  . Current Every Day Smoker -- 1.00 packs/day for 30 years  . Types: Cigarettes  Smokeless tobacco  . Never Used     Ready to quit: No Counseling given: Yes   Past Medical History  Diagnosis Date  . Toe infection     followed by Dr. Jens Som  . Stroke     residual left sided weakness  . Hypertension   . 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 Kidney problem     Undeveloped R kidney  . Breast cancer     Left breast, s/p radiation  . 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 Abdominal hysterectomy    . Bladder repair    . Breast surgery      left  . Carotid endarterectomy  2008    left  . Kidney surgery  1949  . Eye surgery Right 2013    cataract  . Colonoscopy  2008   Family History  Problem Relation Age of Onset  . Stroke Mother   . Heart disease Father    History  Sexual Activity  . Sexual Activity: No    Outpatient Encounter Prescriptions as of 06/30/2015  Medication Sig  . amLODipine (NORVASC) 10 MG tablet Take 1 tablet (10 mg total) by mouth daily.  [name] aspirin 325 MG tablet Take 325 mg by mouth daily.    [name] atorvastatin (LIPITOR) 20 MG tablet Take 1 tablet (20 mg total) by mouth daily.  . cyanocobalamin (,VITAMIN B-12,) 1000 MCG/ML injection Inject 1 mL once a week for 3 weeks, after 3rd  week inject 1 mL once a month  . levothyroxine (SYNTHROID, LEVOTHROID) 175 MCG tablet Take 1 tablet (175 mcg total) by mouth daily.  [name] lisinopril (PRINIVIL,ZESTRIL) 5 MG tablet Take 1 tablet (5 mg total) by mouth daily.  . metoprolol tartrate (LOPRESSOR) 25 MG tablet Take 1 tablet (25 mg total) by mouth 2 (two) times daily.  . traMADol (ULTRAM) 50 MG tablet Take 1 tablet (50 mg total) by mouth every 6 (six) hours as needed. (Patient not taking: Reported on 06/30/2015)   No facility-administered encounter medications on file as of 06/30/2015.    Activities of Daily Living In your present state of health, do you have any difficulty performing the following activities: 06/30/2015  Hearing? N  Vision? N  Difficulty concentrating or making decisions? N  Walking or climbing stairs? Y  Dressing or bathing? N  Doing errands, shopping? N  Preparing Food and eating ? N  Using the Toilet? N  In the past six months, have you accidently leaked urine? N  Do you have problems with loss of bowel control? N  Managing your Medications? N  Managing your Finances? N  Housekeeping or managing your Housekeeping? N    Patient Care Team: Jackolyn Confer, MD as PCP - General (Internal Medicine) Seeplaputhur Robinette Haines, MD (General Surgery)    Assessment:      Osteoporosis risk reviewed; patient declined Dexa Scan at this time. Postponed for 1 year.  Taking meds without issues; no barriers identified.  No Risk for hepatitis or high risk social behavior identified via hepatitis screen  Educated on shingles; patient says she has never had chicken pox.  Safety issues reviewed; smoke detectors in the home.  Wears seatbelt when driving or riding with others.  Firearms locked in safe area.  No violence in the home.  The patient was oriented x 3; appropriate in dress and manner and no objective failures at ADL's or IADL's.   Functional status reviewed; no losses in function x 1 year.  Bowel and bladder  issues assessed; C/O urinary urgency. Followed by Dr. Thurmond Butts.   End of life planning was discussed; patient to bring completed HCPOA documentation.   Exercise Activities and Dietary recommendations Current Exercise Habits:: Exercise is limited by, Limited by:: Other - see comments (Exercise limited by Scoliosis, but does yard work.)  Goals    . Quit smoking / using tobacco      Decrease the amount of cigarettes smoked per day. Currently smokes 20 per day.  Decrease to 15, then to 10, then to 5, then to 2, then to 1 per day. Then to quit.      Fall Risk Fall Risk  06/30/2015 11/28/2014 06/25/2013  Falls in the past year? No No No   Depression Screen PHQ 2/9 Scores 06/30/2015 11/28/2014 06/25/2013  PHQ - 2 Score 0 0 0     Cognitive Testing MMSE - Mini Mental State Exam 06/30/2015  Orientation to time 5  Orientation to Place 5  Registration 3  Attention/ Calculation 5  Recall 3  Language- name 2 objects 2  Language- repeat 1  Language- follow 3 step command 3  Language- read & follow direction 1  Write a sentence 1  Copy design 1  Total score 30    Immunization History  Administered Date(s) Administered  . Influenza Split 08/05/2013  . Influenza-Unspecified 08/05/2014  . Pneumococcal Conjugate-13 11/28/2014  . Pneumococcal Polysaccharide-23 06/26/2012  . Tdap 06/26/2012   Screening Tests Health Maintenance  Topic Date Due  . INFLUENZA VACCINE  06/22/2015  . DEXA SCAN  06/29/2016 (Originally 12/16/2003)  . ZOSTAVAX  06/29/2016 (Originally 12/15/1998)  . TETANUS/TDAP  06/26/2022  . COLONOSCOPY  05/28/2024  . PNA vac Low Risk Adult  Completed      Plan:    During the course of the visit the patient was educated and counseled about the following appropriate screening and preventive services:   Vaccines to include Pneumoccal, Influenza, Hepatitis B, Td, Zostavax, HCV  Electrocardiogram  Cardiovascular Disease  Colorectal cancer screening  Bone density screening-postponed  per patient request.  Diabetes screening  Glaucoma screening  Mammography-referral in progress.  Nutrition counseling   Patient Instructions (the written plan) was given to the patient.   Varney Biles, LPN  06/29/3733

## 2015-07-29 ENCOUNTER — Ambulatory Visit
Admission: RE | Admit: 2015-07-29 | Discharge: 2015-07-29 | Disposition: A | Discharge status: Home / Self Care | Payer: Medicare PPO | Source: Ambulatory Visit | Attending: Internal Medicine | Admitting: Internal Medicine

## 2015-07-29 ENCOUNTER — Other Ambulatory Visit: Payer: Self-pay | Admitting: Internal Medicine

## 2015-07-29 DIAGNOSIS — Z1231 Encounter for screening mammogram for malignant neoplasm of breast: Secondary | ICD-10-CM

## 2015-08-25 ENCOUNTER — Other Ambulatory Visit: Payer: Self-pay

## 2015-08-25 ENCOUNTER — Other Ambulatory Visit: Payer: Self-pay | Admitting: Internal Medicine

## 2015-08-25 DIAGNOSIS — I10 Essential (primary) hypertension: Secondary | ICD-10-CM

## 2015-08-25 DIAGNOSIS — E039 Hypothyroidism, unspecified: Secondary | ICD-10-CM

## 2015-08-25 MED ORDER — METOPROLOL TARTRATE 25 MG PO TABS: 25.0000 mg | ORAL_TABLET | Freq: Two times a day (BID) | ORAL | Status: DC

## 2015-08-25 MED ORDER — AMLODIPINE BESYLATE 10 MG PO TABS: 10.0000 mg | ORAL_TABLET | Freq: Every day | ORAL | Status: DC

## 2015-08-25 MED ORDER — LEVOTHYROXINE SODIUM 175 MCG PO TABS: 175.0000 ug | ORAL_TABLET | Freq: Every day | ORAL | Status: DC

## 2015-12-04 ENCOUNTER — Ambulatory Visit: Payer: Medicare PPO | Admitting: Internal Medicine

## 2015-12-11 ENCOUNTER — Ambulatory Visit (INDEPENDENT_AMBULATORY_CARE_PROVIDER_SITE_OTHER): Payer: Medicare PPO | Admitting: Internal Medicine

## 2015-12-11 ENCOUNTER — Encounter: Payer: Self-pay | Admitting: Internal Medicine

## 2015-12-11 VITALS — BP 118/76 | HR 108 | Temp 98.9°F | Resp 20 | Wt 125.1 lb

## 2015-12-11 DIAGNOSIS — E039 Hypothyroidism, unspecified: Secondary | ICD-10-CM

## 2015-12-11 DIAGNOSIS — E785 Hyperlipidemia, unspecified: Secondary | ICD-10-CM | POA: Diagnosis not present

## 2015-12-11 DIAGNOSIS — D510 Vitamin B12 deficiency anemia due to intrinsic factor deficiency: Secondary | ICD-10-CM

## 2015-12-11 DIAGNOSIS — I10 Essential (primary) hypertension: Secondary | ICD-10-CM | POA: Diagnosis not present

## 2015-12-11 LAB — COMPREHENSIVE METABOLIC PANEL
ALT: 14 U/L (ref 0–35)
AST: 19 U/L (ref 0–37)
Albumin: 3.7 g/dL (ref 3.5–5.2)
Alkaline Phosphatase: 64 U/L (ref 39–117)
BUN: 26 mg/dL — ABNORMAL HIGH (ref 6–23)
CO2: 29 mEq/L (ref 19–32)
Calcium: 9.6 mg/dL (ref 8.4–10.5)
Chloride: 100 mEq/L (ref 96–112)
Creatinine, Ser: 1.19 mg/dL (ref 0.40–1.20)
GFR: 46.75 mL/min — ABNORMAL LOW (ref >60.00)
Glucose, Bld: 85 mg/dL (ref 70–99)
Potassium: 3.8 mEq/L (ref 3.5–5.1)
Sodium: 137 mEq/L (ref 135–145)
Total Bilirubin: 0.3 mg/dL (ref 0.2–1.2)
Total Protein: 7.5 g/dL (ref 6.0–8.3)

## 2015-12-11 LAB — TSH: TSH: 2.95 u[IU]/mL (ref 0.35–4.50)

## 2015-12-11 MED ORDER — CYANOCOBALAMIN 1000 MCG/ML IJ SOLN: INTRAMUSCULAR | Status: DC

## 2015-12-11 NOTE — Assessment & Plan Note (Signed)
BP Readings from Last 3 Encounters:  12/11/15 118/76  06/30/15 128/68  06/24/15 140/63   BP well controlled. Renal function with labs today.

## 2015-12-11 NOTE — Assessment & Plan Note (Signed)
LFTs with labs today. Continue Atorvastatin. Lipids in 6 months.

## 2015-12-11 NOTE — Assessment & Plan Note (Signed)
TSH with labs today. Continue Levothyroxine.

## 2015-12-11 NOTE — Assessment & Plan Note (Signed)
Symptoms improved with B12 q3weeks. Will continue.

## 2015-12-11 NOTE — Progress Notes (Signed)
Subjective:    Patient ID: Cathy Solis, female    DOB: 07/08/1941, 75 y.o.   MRN: [id number]  HPI  75YO female presents for follow up.  Generally, feeling well. Taking B12 shots every 2-3 weeks. If she misses shot, feels more fatigued.  Compliant with medication. Her nephrologist told her to avoid Tramadol and she has stopped taking.  No other acute issues.  Wt Readings from Last 3 Encounters:  12/11/15 125 lb 2 oz (56.756 kg)  06/30/15 124 lb 6.4 oz (56.427 kg)  06/24/15 123 lb 8 oz (56.019 kg)   BP Readings from Last 3 Encounters:  12/11/15 118/76  06/30/15 128/68  06/24/15 140/63    Past Medical History  Diagnosis Date  . Toe infection     followed by Dr. Jens Som  . Stroke [facility])     residual left sided weakness  . Hypertension   . 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 Kidney problem     Undeveloped R kidney  . Cancer [facility]) 2006    Nose  . Breast cancer [facility]) 2006    Left breast, s/p radiation   Family History  Problem Relation Age of Onset  . Stroke Mother   . Heart disease Father    Past Surgical History  Procedure Laterality Date  . Abdominal hysterectomy    . Bladder repair    . Breast surgery      left  . Carotid endarterectomy  2008    left  . Kidney surgery  1949  . Eye surgery Right 2013    cataract  . Colonoscopy  2008   Social History   Social History  . Marital Status: Widowed    Spouse Name: N/A  . Number of Children: N/A  . Years of Education: N/A   Social History Main Topics  . Smoking status: Current Every Day Smoker -- 1.00 packs/day for 30 years    Types: Cigarettes  . Smokeless tobacco: Never Used  . Alcohol Use: No  . Drug Use: No  . Sexual Activity: No   Other Topics Concern  . None   Social History Narrative    Review of Systems  Constitutional: Positive for fatigue. Negative for fever, chills, appetite change and unexpected weight change.  Eyes: Negative for  visual disturbance.  Respiratory: Negative for shortness of breath.   Cardiovascular: Negative for chest pain and leg swelling.  Gastrointestinal: Negative for nausea, vomiting, abdominal pain, diarrhea and constipation.  Musculoskeletal: Negative for myalgias and arthralgias.  Skin: Negative for color change and rash.  Hematological: Negative for adenopathy. Does not bruise/bleed easily.  Psychiatric/Behavioral: Negative for sleep disturbance and dysphoric mood. The patient is not nervous/anxious.        Objective:    BP 118/76 mmHg  Pulse 108  Temp(Src) 98.9 F (37.2 C) (Oral)  Resp 20  Wt 125 lb 2 oz (56.756 kg)  SpO2 94% Physical Exam  Constitutional: She is oriented to person, place, and time. She appears well-developed and well-nourished. No distress.  HENT:  Head: Normocephalic and atraumatic.  Right Ear: External ear normal.  Left Ear: External ear normal.  Nose: Nose normal.  Mouth/Throat: Oropharynx is clear and moist. No oropharyngeal exudate.  Eyes: Conjunctivae are normal. Pupils are equal, round, and reactive to light. Right eye exhibits no discharge. Left eye exhibits no discharge. No scleral icterus.  Neck: Normal range of motion. Neck supple. No tracheal deviation present. No thyromegaly present.  Cardiovascular: Normal rate, regular rhythm, normal heart sounds and intact distal pulses.  Exam reveals no gallop and no friction rub.   No murmur heard. Pulmonary/Chest: Effort normal and breath sounds normal. No respiratory distress. She has no wheezes. She has no rales. She exhibits no tenderness.  Musculoskeletal: Normal range of motion. She exhibits no edema or tenderness.  Lymphadenopathy:    She has no cervical adenopathy.  Neurological: She is alert and oriented to person, place, and time. No cranial nerve deficit. She exhibits normal muscle tone. Coordination normal.  Skin: Skin is warm and dry. No rash noted. She is not diaphoretic. No erythema. No pallor.    Psychiatric: She has a normal mood and affect. Her behavior is normal. Judgment and thought content normal.          Assessment & Plan:   Problem List Items Addressed This Visit      Unprioritized   Hyperlipidemia    LFTs with labs today. Continue Atorvastatin. Lipids in 6 months.      Relevant Orders   TSH   Hypertension - Primary    BP Readings from Last 3 Encounters:  12/11/15 118/76  06/30/15 128/68  06/24/15 140/63   BP well controlled. Renal function with labs today.      Relevant Orders   Comp Met (CMET)   Hypothyroidism    TSH with labs today. Continue Levothyroxine.      Pernicious anemia    Symptoms improved with B12 q3weeks. Will continue.      Relevant Medications   cyanocobalamin (,VITAMIN B-12,) 1000 MCG/ML injection       Return in about 6 months (around 06/09/2016) for Physical.

## 2015-12-11 NOTE — Patient Instructions (Signed)
Labs today.  Follow up in 6 months for physical. 

## 2015-12-11 NOTE — Progress Notes (Signed)
Pre visit review using our clinic review tool, if applicable. No additional management support is needed unless otherwise documented below in the visit note. 

## 2015-12-12 ENCOUNTER — Encounter: Payer: Self-pay | Admitting: *Deleted

## 2015-12-21 ENCOUNTER — Other Ambulatory Visit: Payer: Self-pay

## 2015-12-21 DIAGNOSIS — I10 Essential (primary) hypertension: Secondary | ICD-10-CM

## 2015-12-21 DIAGNOSIS — E039 Hypothyroidism, unspecified: Secondary | ICD-10-CM

## 2015-12-21 MED ORDER — LISINOPRIL 5 MG PO TABS: ORAL_TABLET | ORAL | Status: DC

## 2015-12-21 MED ORDER — LEVOTHYROXINE SODIUM 175 MCG PO TABS: 175.0000 ug | ORAL_TABLET | Freq: Every day | ORAL | Status: DC

## 2015-12-21 MED ORDER — METOPROLOL TARTRATE 25 MG PO TABS: 25.0000 mg | ORAL_TABLET | Freq: Two times a day (BID) | ORAL | Status: DC

## 2015-12-21 MED ORDER — AMLODIPINE BESYLATE 10 MG PO TABS: 10.0000 mg | ORAL_TABLET | Freq: Every day | ORAL | Status: DC

## 2015-12-21 MED ORDER — ATORVASTATIN CALCIUM 20 MG PO TABS: ORAL_TABLET | ORAL | Status: DC

## 2016-02-18 ENCOUNTER — Ambulatory Visit (INDEPENDENT_AMBULATORY_CARE_PROVIDER_SITE_OTHER): Payer: Medicare PPO | Admitting: Internal Medicine

## 2016-02-18 ENCOUNTER — Encounter: Payer: Self-pay | Admitting: Internal Medicine

## 2016-02-18 VITALS — BP 114/69 | HR 60 | Temp 98.0°F | Ht 64.0 in | Wt 120.4 lb

## 2016-02-18 DIAGNOSIS — R5382 Chronic fatigue, unspecified: Secondary | ICD-10-CM | POA: Diagnosis not present

## 2016-02-18 DIAGNOSIS — I10 Essential (primary) hypertension: Secondary | ICD-10-CM

## 2016-02-18 DIAGNOSIS — E039 Hypothyroidism, unspecified: Secondary | ICD-10-CM

## 2016-02-18 LAB — CBC WITH DIFFERENTIAL/PLATELET
Basophils Absolute: 0 10*3/uL (ref 0.0–0.1)
Basophils Relative: 0.5 % (ref 0.0–3.0)
Eosinophils Absolute: 0.3 10*3/uL (ref 0.0–0.7)
Eosinophils Relative: 3.6 % (ref 0.0–5.0)
HCT: 40.2 % (ref 36.0–46.0)
Hemoglobin: 13.6 g/dL (ref 12.0–15.0)
Lymphocytes Relative: 16.7 % (ref 12.0–46.0)
Lymphs Abs: 1.5 10*3/uL (ref 0.7–4.0)
MCHC: 33.7 g/dL (ref 30.0–36.0)
MCV: 92 fl (ref 78.0–100.0)
Monocytes Absolute: 1 10*3/uL (ref 0.1–1.0)
Monocytes Relative: 11.3 % (ref 3.0–12.0)
Neutro Abs: 6 10*3/uL (ref 1.4–7.7)
Neutrophils Relative %: 67.9 % (ref 43.0–77.0)
Platelets: 268 10*3/uL (ref 150.0–400.0)
RBC: 4.38 Mil/uL (ref 3.87–5.11)
RDW: 13.9 % (ref 11.5–15.5)
WBC: 8.9 10*3/uL (ref 4.0–10.5)

## 2016-02-18 LAB — COMPREHENSIVE METABOLIC PANEL
ALT: 12 U/L (ref 0–35)
AST: 18 U/L (ref 0–37)
Albumin: 3.8 g/dL (ref 3.5–5.2)
Alkaline Phosphatase: 67 U/L (ref 39–117)
BUN: 29 mg/dL — ABNORMAL HIGH (ref 6–23)
CO2: 27 mEq/L (ref 19–32)
Calcium: 9.6 mg/dL (ref 8.4–10.5)
Chloride: 101 mEq/L (ref 96–112)
Creatinine, Ser: 1.21 mg/dL — ABNORMAL HIGH (ref 0.40–1.20)
GFR: 45.84 mL/min — ABNORMAL LOW (ref >60.00)
Glucose, Bld: 98 mg/dL (ref 70–99)
Potassium: 4 mEq/L (ref 3.5–5.1)
Sodium: 135 mEq/L (ref 135–145)
Total Bilirubin: 0.3 mg/dL (ref 0.2–1.2)
Total Protein: 7.3 g/dL (ref 6.0–8.3)

## 2016-02-18 LAB — TSH: TSH: 0.66 u[IU]/mL (ref 0.35–4.50)

## 2016-02-18 LAB — T4, FREE: Free T4: 1.59 ng/dL (ref 0.60–1.60)

## 2016-02-18 LAB — VITAMIN B12: Vitamin B-12: 580 pg/mL (ref 211–911)

## 2016-02-18 MED ORDER — CYANOCOBALAMIN 1000 MCG/ML IJ SOLN: INTRAMUSCULAR | Status: DC

## 2016-02-18 NOTE — Assessment & Plan Note (Signed)
Will check TSH with labs given recent change from Synthroid to generic Levothyroxine.  Continue Levothyroxine.

## 2016-02-18 NOTE — Assessment & Plan Note (Signed)
Symptoms of generalized fatigue. Likely multifactorial with age, B12 def, hypothyroidism, smoking. Exam non-focal. Will check labs including TSH, CMP, B12, CBC.

## 2016-02-18 NOTE — Progress Notes (Signed)
Subjective:    Patient ID: Cathy Solis, female    DOB: [birth year], 75 y.o.   MRN: [id number]  HPI  75YO female presents for follow up.  Feeling generally tired and notes more dry skin. Concerned that this is due to generic levothyroxine. No chest pain, dyspnea. Some constipation which is chronic.  She feels that her vet would be much more apt to care for her than any physician. She states this each visit.   Wt Readings from Last 3 Encounters:  02/18/16 120 lb 6 oz (54.602 kg)  12/11/15 125 lb 2 oz (56.756 kg)  06/30/15 124 lb 6.4 oz (56.427 kg)   BP Readings from Last 3 Encounters:  02/18/16 114/69  12/11/15 118/76  06/30/15 128/68    Past Medical History  Diagnosis Date  . Toe infection     followed by Dr. Jens Som  . Stroke [facility])     residual left sided weakness  . Hypertension   . 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 Kidney problem     Undeveloped R kidney  . Cancer [facility]) 2006    Nose  . Breast cancer [facility]) 2006    Left breast, s/p radiation   Family History  Problem Relation Age of Onset  . Stroke Mother   . Heart disease Father    Past Surgical History  Procedure Laterality Date  . Abdominal hysterectomy    . Bladder repair    . Breast surgery      left  . Carotid endarterectomy  2008    left  . Kidney surgery  1949  . Eye surgery Right 2013    cataract  . Colonoscopy  2008   Social History   Social History  . Marital Status: Widowed    Spouse Name: N/A  . Number of Children: N/A  . Years of Education: N/A   Social History Main Topics  . Smoking status: Current Every Day Smoker -- 1.00 packs/day for 30 years    Types: Cigarettes  . Smokeless tobacco: Never Used  . Alcohol Use: No  . Drug Use: No  . Sexual Activity: No   Other Topics Concern  . None   Social History Narrative    Review of Systems  Constitutional: Positive for fatigue. Negative for fever, chills, appetite change and  unexpected weight change.  Eyes: Negative for visual disturbance.  Respiratory: Negative for shortness of breath.   Cardiovascular: Negative for chest pain and leg swelling.  Gastrointestinal: Positive for constipation. Negative for nausea, vomiting, abdominal pain and diarrhea.  Musculoskeletal: Negative for myalgias and arthralgias.  Skin: Negative for color change and rash.  Neurological: Negative for weakness.  Hematological: Negative for adenopathy. Does not bruise/bleed easily.  Psychiatric/Behavioral: Negative for sleep disturbance and dysphoric mood. The patient is not nervous/anxious.        Objective:    BP 114/69 mmHg  Pulse 60  Temp(Src) 98 F (36.7 C) (Oral)  Ht 5\' 4"  (1.626 m)  Wt 120 lb 6 oz (54.602 kg)  BMI 20.65 kg/m2  SpO2 97% Physical Exam  Constitutional: She is oriented to person, place, and time. She appears well-developed and well-nourished. No distress.  HENT:  Head: Normocephalic and atraumatic.  Right Ear: External ear normal.  Left Ear: External ear normal.  Nose: Nose normal.  Mouth/Throat: Oropharynx is clear and moist. No oropharyngeal exudate.  Eyes: Conjunctivae are normal. Pupils are equal, round, and reactive to light. Right eye exhibits  no discharge. Left eye exhibits no discharge. No scleral icterus.  Neck: Normal range of motion. Neck supple. No tracheal deviation present. No thyromegaly present.  Cardiovascular: Normal rate, regular rhythm, normal heart sounds and intact distal pulses.  Exam reveals no gallop and no friction rub.   No murmur heard. Pulmonary/Chest: Effort normal. No accessory muscle usage. No tachypnea. No respiratory distress. She has decreased breath sounds (prolonged expiration). She has no wheezes. She has no rhonchi. She has no rales. She exhibits no tenderness.  Musculoskeletal: Normal range of motion. She exhibits no edema or tenderness.  Lymphadenopathy:    She has no cervical adenopathy.  Neurological: She is alert  and oriented to person, place, and time. No cranial nerve deficit. She exhibits normal muscle tone. Coordination normal.  Skin: Skin is warm and dry. No rash noted. She is not diaphoretic. No erythema. No pallor.  Psychiatric: She has a normal mood and affect. Her behavior is normal. Judgment and thought content normal.          Assessment & Plan:   Problem List Items Addressed This Visit      Unprioritized   Chronic fatigue    Symptoms of generalized fatigue. Likely multifactorial with age, B12 def, hypothyroidism, smoking. Exam non-focal. Will check labs including TSH, CMP, B12, CBC.      Relevant Orders   CBC w/Diff   Comprehensive metabolic panel   123456   Hypertension - Primary    BP Readings from Last 3 Encounters:  02/18/16 114/69  12/11/15 118/76  06/30/15 128/68    BP well controlled. Continue current medication. Renal function with labs.      Hypothyroidism    Will check TSH with labs given recent change from Synthroid to generic Levothyroxine.  Continue Levothyroxine.      Relevant Orders   TSH   T4, free       Return in about 4 weeks (around 03/17/2016) for Recheck.   Ronette Deter, MD Internal Medicine [facility]

## 2016-02-18 NOTE — Assessment & Plan Note (Signed)
BP Readings from Last 3 Encounters:  02/18/16 114/69  12/11/15 118/76  06/30/15 128/68    BP well controlled. Continue current medication. Renal function with labs.

## 2016-02-18 NOTE — Patient Instructions (Signed)
We will check thyroid levels with labs today.  Follow up in 4 weeks.

## 2016-02-18 NOTE — Progress Notes (Signed)
Pre visit review using our clinic review tool, if applicable. No additional management support is needed unless otherwise documented below in the visit note. 

## 2016-03-14 ENCOUNTER — Telehealth: Payer: Self-pay | Admitting: *Deleted

## 2016-03-14 NOTE — Telephone Encounter (Signed)
Left voicemail  message for patient notifying her that it is time to schedule the recommended  lung cancer screening low dose CT scan. Requested patient to call back to verify information prior to the scan being scheduled.

## 2016-03-15 ENCOUNTER — Telehealth: Payer: Self-pay | Admitting: *Deleted

## 2016-03-15 NOTE — Telephone Encounter (Signed)
Notified patient that annual lung cancer screening low dose CT scan is due. Confirmed that patient is within the age range of 55-77, and asymptomatic, (no signs or symptoms of lung cancer). No other disease process that would prevent treatment of a lung cancer. The patient is a current smoker, with a 55.5 pack year history. The shared decision making visit was done 12/10/14. Patient is agreeable for CT scan being scheduled.

## 2016-03-18 ENCOUNTER — Other Ambulatory Visit: Payer: Self-pay | Admitting: Family Medicine

## 2016-03-18 ENCOUNTER — Encounter: Payer: Self-pay | Admitting: Family Medicine

## 2016-03-18 DIAGNOSIS — Z87891 Personal history of nicotine dependence: Secondary | ICD-10-CM

## 2016-03-18 HISTORY — DX: Personal history of nicotine dependence: Z87.891

## 2016-03-29 ENCOUNTER — Ambulatory Visit
Admission: RE | Admit: 2016-03-29 | Discharge: 2016-03-29 | Disposition: A | Discharge status: Home / Self Care | Payer: Medicare PPO | Source: Ambulatory Visit | Attending: Family Medicine | Admitting: Family Medicine

## 2016-03-29 DIAGNOSIS — I2510 Atherosclerotic heart disease of native coronary artery without angina pectoris: Secondary | ICD-10-CM | POA: Insufficient documentation

## 2016-03-29 DIAGNOSIS — Z87891 Personal history of nicotine dependence: Secondary | ICD-10-CM | POA: Diagnosis present

## 2016-03-29 DIAGNOSIS — J439 Emphysema, unspecified: Secondary | ICD-10-CM | POA: Insufficient documentation

## 2016-05-23 ENCOUNTER — Ambulatory Visit (INDEPENDENT_AMBULATORY_CARE_PROVIDER_SITE_OTHER): Payer: Medicare PPO | Admitting: Internal Medicine

## 2016-05-23 ENCOUNTER — Encounter: Payer: Self-pay | Admitting: Internal Medicine

## 2016-05-23 VITALS — BP 148/78 | HR 59 | Ht 66.0 in | Wt 120.0 lb

## 2016-05-23 DIAGNOSIS — M4120 Other idiopathic scoliosis, site unspecified: Secondary | ICD-10-CM | POA: Diagnosis not present

## 2016-05-23 DIAGNOSIS — I2510 Atherosclerotic heart disease of native coronary artery without angina pectoris: Secondary | ICD-10-CM | POA: Diagnosis not present

## 2016-05-23 NOTE — Progress Notes (Signed)
Pre visit review using our clinic review tool, if applicable. No additional management support is needed unless otherwise documented below in the visit note. 

## 2016-05-23 NOTE — Patient Instructions (Signed)
We will set up an evaluation with Dr. Sharlet Salina, a back specialist at [facility].

## 2016-05-23 NOTE — Progress Notes (Signed)
Subjective:    Patient ID: Cathy Solis, female    DOB: [birth year], 75 y.o.   MRN: [id number]  HPI  75YO female presents for acute visit.  Back pain - Chronic scoliosis. Pain has been much worse recently and it is more difficult to stand straight. Aching pain. Does not radiate. Not taking anything for pain except for occasional tramadol. Questions if brace might be helpful. She is more concerned about ability to stand straight and walk normally, than pain symptoms.    Wt Readings from Last 3 Encounters:  05/23/16 120 lb (54.432 kg)  03/29/16 120 lb (54.432 kg)  02/18/16 120 lb 6 oz (54.602 kg)   BP Readings from Last 3 Encounters:  05/23/16 148/78  02/18/16 114/69  12/11/15 118/76    Past Medical History  Diagnosis Date  . Toe infection     followed by Dr. Jens Som  . Stroke [facility])     residual left sided weakness  . Hypertension   . 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 Kidney problem     Undeveloped R kidney  . Cancer [facility]) 2006    Nose  . Breast cancer [facility]) 2006    Left breast, s/p radiation  . Personal history of tobacco use, presenting hazards to health 03/18/2016   Family History  Problem Relation Age of Onset  . Stroke Mother   . Heart disease Father    Past Surgical History  Procedure Laterality Date  . Abdominal hysterectomy    . Bladder repair    . Breast surgery      left  . Carotid endarterectomy  2008    left  . Kidney surgery  1949  . Eye surgery Right 2013    cataract  . Colonoscopy  2008   Social History   Social History  . Marital Status: Widowed    Spouse Name: N/A  . Number of Children: N/A  . Years of Education: N/A   Social History Main Topics  . Smoking status: Current Every Day Smoker -- 1.00 packs/day for 30 years    Types: Cigarettes  . Smokeless tobacco: Never Used  . Alcohol Use: No  . Drug Use: No  . Sexual Activity: No   Other Topics Concern  . None   Social History  Narrative    Review of Systems  Constitutional: Negative for fever, chills, appetite change, fatigue and unexpected weight change.  Eyes: Negative for visual disturbance.  Respiratory: Negative for shortness of breath.   Cardiovascular: Negative for chest pain and leg swelling.  Gastrointestinal: Negative for abdominal pain.  Musculoskeletal: Positive for myalgias, back pain, arthralgias and gait problem.  Skin: Negative for color change and rash.  Hematological: Negative for adenopathy. Does not bruise/bleed easily.  Psychiatric/Behavioral: Negative for dysphoric mood. The patient is not nervous/anxious.        Objective:    BP 148/78 mmHg  Pulse 59  Ht 5\' 6"  (1.676 m)  Wt 120 lb (54.432 kg)  BMI 19.38 kg/m2  SpO2 93% Physical Exam  Constitutional: She is oriented to person, place, and time. She appears well-developed and well-nourished. No distress.  HENT:  Head: Normocephalic and atraumatic.  Right Ear: External ear normal.  Left Ear: External ear normal.  Nose: Nose normal.  Mouth/Throat: Oropharynx is clear and moist.  Eyes: Conjunctivae are normal. Pupils are equal, round, and reactive to light. Right eye exhibits no discharge. Left eye exhibits no discharge. No scleral icterus.  Neck: Normal range of motion. Neck supple. No tracheal deviation present. No thyromegaly present.  Cardiovascular: Normal rate, regular rhythm, normal heart sounds and intact distal pulses.  Exam reveals no gallop and no friction rub.   No murmur heard. Pulmonary/Chest: Effort normal and breath sounds normal. No respiratory distress. She has no wheezes. She has no rales. She exhibits no tenderness.  Musculoskeletal: She exhibits no edema or tenderness.       Thoracic back: She exhibits decreased range of motion, deformity and pain. She exhibits no tenderness and no bony tenderness.       Lumbar back: She exhibits decreased range of motion and deformity. She exhibits no tenderness.        Back:  Lymphadenopathy:    She has no cervical adenopathy.  Neurological: She is alert and oriented to person, place, and time. No cranial nerve deficit. She exhibits normal muscle tone. Coordination normal.  Skin: Skin is warm and dry. No rash noted. She is not diaphoretic. No erythema. No pallor.  Psychiatric: She has a normal mood and affect. Her behavior is normal. Judgment and thought content normal.          Assessment & Plan:   Problem List Items Addressed This Visit      Unprioritized   Coronary atherosclerosis    CAD noted on screening CT chest. She is asymptomatic. On aspirin and statin. Continues to smoke. Reviewed CT findings with her today.      Idiopathic scoliosis - Primary    Chronic scoliosis. She is highly functional and on minimal pain medication. Medications are limited by CKD.  Doubt that a back brace would be helpful, but will set up an evaluation with Dr. Sharlet Salina in physiatry to see get recommendations. Continue prn Tramadol for pain.      Relevant Orders   Ambulatory referral to Orthopedic Surgery       Return in about 3 months (around 08/23/2016) for New Patient.  Ronette Deter, MD Internal Medicine [facility]

## 2016-05-23 NOTE — Assessment & Plan Note (Signed)
CAD noted on screening CT chest. She is asymptomatic. On aspirin and statin. Continues to smoke. Reviewed CT findings with her today.

## 2016-05-23 NOTE — Assessment & Plan Note (Signed)
Chronic scoliosis. She is highly functional and on minimal pain medication. Medications are limited by CKD.  Doubt that a back brace would be helpful, but will set up an evaluation with Dr. Sharlet Salina in physiatry to see get recommendations. Continue prn Tramadol for pain.

## 2016-05-31 ENCOUNTER — Encounter: Payer: Self-pay | Admitting: *Deleted

## 2016-06-07 ENCOUNTER — Ambulatory Visit (INDEPENDENT_AMBULATORY_CARE_PROVIDER_SITE_OTHER): Payer: Medicare PPO | Admitting: General Surgery

## 2016-06-07 ENCOUNTER — Ambulatory Visit: Payer: Self-pay

## 2016-06-07 ENCOUNTER — Encounter: Payer: Self-pay | Admitting: General Surgery

## 2016-06-07 VITALS — BP 112/68 | HR 62 | Resp 12 | Ht 66.0 in | Wt 119.0 lb

## 2016-06-07 DIAGNOSIS — I6529 Occlusion and stenosis of unspecified carotid artery: Secondary | ICD-10-CM | POA: Diagnosis not present

## 2016-06-07 NOTE — Patient Instructions (Signed)
Patient to return in one year carotid ultrasound

## 2016-06-07 NOTE — Progress Notes (Signed)
Patient ID: Cathy Solis, female   DOB: Mar 07, 1941, 75 y.o.   MRN: [id number]  Chief Complaint  Patient presents with  . Other    carotid     HPI Cathy Solis is a 75 y.o. female who presents for a 1 year follow up carotid ultrasound. She denies any new problems. She s/p left CEA. No neurological symptoms to report. No new health issues in last yr. I have reviewed the history of present illness with the patient.  HPI  Past Medical History  Diagnosis Date  . Toe infection     followed by Dr. Jens Som  . Stroke [facility])     residual left sided weakness  . Hypertension   . 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 Kidney problem     Undeveloped R kidney  . 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 Breast cancer [facility]) 2006    Left breast, s/p radiation  . Personal history of tobacco use, presenting hazards to health 03/18/2016    Past Surgical History  Procedure Laterality Date  . Abdominal hysterectomy    . Bladder repair    . Breast surgery      left  . Carotid endarterectomy  2008    left  . Kidney surgery  1949  . Eye surgery Right 2013    cataract  . Colonoscopy  2008    Family History  Problem Relation Age of Onset  . Stroke Mother   . Heart disease Father     Social History Social History  Substance Use Topics  . Smoking status: Current Every Day Smoker -- 1.00 packs/day for 30 years    Types: Cigarettes  . Smokeless tobacco: Never Used  . Alcohol Use: No    No Known Allergies  Current Outpatient Prescriptions  Medication Sig Dispense Refill  . amLODipine (NORVASC) 10 MG tablet Take 1 tablet (10 mg total) by mouth daily. 90 tablet 3  . aspirin 325 MG tablet Take 325 mg by mouth daily.      [name] atorvastatin (LIPITOR) 20 MG tablet TAKE 1 TABLET BY MOUTH EACH DAY 90 tablet 3  . cyanocobalamin (,VITAMIN B-12,) 1000 MCG/ML injection INJECT 1ML INTRAMUSCULARLY ONCE EVERY 2-3 WEEKS 30 mL 3  . levothyroxine (SYNTHROID, LEVOTHROID)  175 MCG tablet Take 1 tablet (175 mcg total) by mouth daily. 90 tablet 3  . lisinopril (PRINIVIL,ZESTRIL) 5 MG tablet TAKE 1 TABLET BY MOUTH EACH DAY 90 tablet 3  . metoprolol tartrate (LOPRESSOR) 25 MG tablet Take 1 tablet (25 mg total) by mouth 2 (two) times daily. 180 tablet 3   No current facility-administered medications for this visit.    Review of Systems Review of Systems  Constitutional: Negative.   Respiratory: Negative.   Cardiovascular: Negative.     Blood pressure 112/68, pulse 62, resp. rate 12, height 5\' 6"  (1.676 m), weight 119 lb (53.978 kg).  Physical Exam Physical Exam  Constitutional: She is oriented to person, place, and time. She appears well-developed and well-nourished.  Eyes: Conjunctivae are normal. No scleral icterus.  Cardiovascular: Normal rate and regular rhythm.   Pulses:      Carotid pulses are 2+ on the right side, and 2+ on the left side. Neurological: She is alert and oriented to person, place, and time.  Skin: Skin is warm and dry.    Data Reviewed Prior notes reviewed  Carotid Duplex study was performed. There is again mildly irregular calcific plaquing  noted in right ICA origin, stenosis of 25%. This is uncahged from last year. Left side with mild plaque seen in ECA. Endarterectomy site shows no sign of recurrence. Assessment     Carotid stenosis on right, mild. S/P left CEA.    Plan    Patient to return in one year- carotid ultrasound     PCP:  Gilford Rile  This information has been scribed by Verlene Mayer, [place], [place] 06/07/2016, 9:34 AM

## 2016-06-08 ENCOUNTER — Encounter: Payer: Self-pay | Admitting: General Surgery

## 2016-06-30 ENCOUNTER — Ambulatory Visit: Payer: Medicare PPO

## 2016-08-11 ENCOUNTER — Telehealth: Payer: Self-pay | Admitting: Internal Medicine

## 2016-08-11 NOTE — Telephone Encounter (Signed)
Left pt a vm to call the office to sch to get flu shot.

## 2016-12-07 ENCOUNTER — Ambulatory Visit: Payer: Self-pay | Admitting: Family Medicine

## 2016-12-09 ENCOUNTER — Ambulatory Visit (INDEPENDENT_AMBULATORY_CARE_PROVIDER_SITE_OTHER): Payer: Medicare HMO | Admitting: Family Medicine

## 2016-12-09 ENCOUNTER — Encounter: Payer: Self-pay | Admitting: Family Medicine

## 2016-12-09 VITALS — BP 131/67 | HR 62 | Temp 98.5°F | Ht 62.3 in | Wt 116.4 lb

## 2016-12-09 DIAGNOSIS — I129 Hypertensive chronic kidney disease with stage 1 through stage 4 chronic kidney disease, or unspecified chronic kidney disease: Secondary | ICD-10-CM | POA: Diagnosis not present

## 2016-12-09 DIAGNOSIS — N135 Crossing vessel and stricture of ureter without hydronephrosis: Secondary | ICD-10-CM | POA: Diagnosis not present

## 2016-12-09 DIAGNOSIS — I10 Essential (primary) hypertension: Secondary | ICD-10-CM | POA: Diagnosis not present

## 2016-12-09 DIAGNOSIS — R809 Proteinuria, unspecified: Secondary | ICD-10-CM

## 2016-12-09 DIAGNOSIS — I2510 Atherosclerotic heart disease of native coronary artery without angina pectoris: Secondary | ICD-10-CM | POA: Diagnosis not present

## 2016-12-09 DIAGNOSIS — D510 Vitamin B12 deficiency anemia due to intrinsic factor deficiency: Secondary | ICD-10-CM

## 2016-12-09 DIAGNOSIS — N1339 Other hydronephrosis: Secondary | ICD-10-CM

## 2016-12-09 DIAGNOSIS — E039 Hypothyroidism, unspecified: Secondary | ICD-10-CM

## 2016-12-09 DIAGNOSIS — E782 Mixed hyperlipidemia: Secondary | ICD-10-CM | POA: Diagnosis not present

## 2016-12-09 DIAGNOSIS — M4124 Other idiopathic scoliosis, thoracic region: Secondary | ICD-10-CM | POA: Diagnosis not present

## 2016-12-09 DIAGNOSIS — R8290 Unspecified abnormal findings in urine: Secondary | ICD-10-CM | POA: Diagnosis not present

## 2016-12-09 LAB — MICROALBUMIN, URINE WAIVED
Creatinine, Urine Waived: 200 mg/dL (ref 10–300)
Microalb, Ur Waived: 150 mg/L — ABNORMAL HIGH (ref 0–19)
Microalb/Creat Ratio: >300 mg/g — ABNORMAL HIGH (ref <30)

## 2016-12-09 MED ORDER — ATORVASTATIN CALCIUM 20 MG PO TABS: ORAL_TABLET | ORAL | 1 refills | Status: DC

## 2016-12-09 MED ORDER — AMLODIPINE BESYLATE 10 MG PO TABS: 10.0000 mg | ORAL_TABLET | Freq: Every day | ORAL | 1 refills | Status: DC

## 2016-12-09 MED ORDER — METOPROLOL TARTRATE 25 MG PO TABS: 25.0000 mg | ORAL_TABLET | Freq: Two times a day (BID) | ORAL | 1 refills | Status: DC

## 2016-12-09 MED ORDER — LISINOPRIL 5 MG PO TABS: ORAL_TABLET | ORAL | 1 refills | Status: DC

## 2016-12-09 MED ORDER — CYANOCOBALAMIN 1000 MCG/ML IJ SOLN: INTRAMUSCULAR | 3 refills | Status: DC

## 2016-12-09 NOTE — Assessment & Plan Note (Signed)
Rechecking levels today. Continue current regimen. Continue to monitor. Call with any concerns.  

## 2016-12-09 NOTE — Assessment & Plan Note (Signed)
Stable. Continue to monitor. Call with any concerns.  ?

## 2016-12-09 NOTE — Assessment & Plan Note (Signed)
Continue to follow with nephrology at [facility]. Call with any concerns.

## 2016-12-09 NOTE — Progress Notes (Signed)
BP 131/67 (BP Location: Left Arm, Patient Position: Sitting, Cuff Size: Normal)   Pulse 62   Temp 98.5 F (36.9 C)   Ht 5' 2.3" (1.582 m)   Wt 116 lb 6.4 oz (52.8 kg)   SpO2 97%   BMI 21.09 kg/m    Subjective:    Patient ID: Cathy Solis, female    DOB: 02/06/1941, 76 y.o.   MRN: [id number]  HPI: Cathy Solis is a 76 y.o. female  Chief Complaint  Patient presents with  . Hypertension  . Hyperlipidemia  . Hypothyroidism   HYPERTENSION / HYPERLIPIDEMIA Satisfied with current treatment? yes Duration of hypertension: chronic BP monitoring frequency: not checking BP medication side effects: no Past BP meds: lisinopril, metoprolol, amlodipine Duration of hyperlipidemia: chronic Cholesterol medication side effects: no Cholesterol supplements: none Past cholesterol medications: atorvastatin Medication compliance: excellent compliance Aspirin: yes Recent stressors: no Recurrent headaches: no Visual changes: no Palpitations: no Dyspnea: no Chest pain: no Lower extremity edema: no Dizzy/lightheaded: no  HYPOTHYROIDISM Thyroid control status:controlled Satisfied with current treatment? yes Medication side effects: no Medication compliance: excellent compliance Recent dose adjustment:no Fatigue: yes Cold intolerance: yes Heat intolerance: no Weight gain: no Weight loss: yes Constipation: no Diarrhea/loose stools: no Palpitations: no Lower extremity edema: no Anxiety/depressed mood: no  ANEMIA Anemia status: controlled Etiology of anemia: Pernicious Anemia Duration of anemia treatment: chronic Compliance with treatment: excellent compliance B12 supplementation side effects: None Severity of anemia: mild Fatigue: no Decreased exercise tolerance: no  Dyspnea on exertion: no Palpitations: no Bleeding: no Pica: no   Active Ambulatory Problems    Diagnosis Date Noted  . 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 Hypothyroidism 08/04/2011  .  Proteinuria 12/29/2011  . Hyperlipidemia 12/29/2011  . Weakness 06/26/2012  . Pernicious anemia 06/26/2012  . Occlusion and stenosis of carotid artery without mention of cerebral infarction 05/23/2013  . Postmenopausal estrogen deficiency 11/13/2013  . 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 Hydronephrosis 02/04/2014  . Idiopathic scoliosis 11/28/2014  . 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 Chronic fatigue 06/24/2015  . Personal history of tobacco use, presenting hazards to health 03/18/2016  . Coronary atherosclerosis 05/23/2016   Resolved Ambulatory Problems    Diagnosis Date Noted  . Medicare annual wellness visit, subsequent 11/13/2013  . Tobacco abuse 11/28/2014  . Visit for screening mammogram 06/24/2015   Past Medical History:  Diagnosis Date  . Breast cancer ([place]) 2006  . Cancer ([place]) 2006  . Hypertension   . Kidney problem   . 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 Personal history of tobacco use, presenting hazards to health 03/18/2016  . Stroke ([place])   . Toe infection    Past Surgical History:  Procedure Laterality Date  . ABDOMINAL HYSTERECTOMY    . BLADDER REPAIR    . BREAST SURGERY     left  . CAROTID ENDARTERECTOMY  2008   left  . COLONOSCOPY  2008  . EYE SURGERY Right 2013   cataract  . [place]   Outpatient Encounter Prescriptions as of 12/09/2016  Medication Sig  . amLODipine (NORVASC) 10 MG tablet Take 1 tablet (10 mg total) by mouth daily.  [name] aspirin 325 MG tablet Take 325 mg by mouth daily.    [name] atorvastatin (LIPITOR) 20 MG tablet TAKE 1 TABLET BY MOUTH EACH DAY  . cyanocobalamin (,VITAMIN B-12,) 1000 MCG/ML injection INJECT 1ML INTRAMUSCULARLY ONCE EVERY 2-3 WEEKS  .  levothyroxine (SYNTHROID, LEVOTHROID) 175 MCG tablet Take 1 tablet (175 mcg total) by mouth daily.  [name] lisinopril (PRINIVIL,ZESTRIL) 5 MG tablet TAKE 1 TABLET BY MOUTH EACH DAY  .  metoprolol tartrate (LOPRESSOR) 25 MG tablet Take 1 tablet (25 mg total) by mouth 2 (two) times daily.  . [DISCONTINUED] amLODipine (NORVASC) 10 MG tablet Take 1 tablet (10 mg total) by mouth daily.  . [DISCONTINUED] atorvastatin (LIPITOR) 20 MG tablet TAKE 1 TABLET BY MOUTH EACH DAY  . [DISCONTINUED] cyanocobalamin (,VITAMIN B-12,) 1000 MCG/ML injection INJECT 1ML INTRAMUSCULARLY ONCE EVERY 2-3 WEEKS  . [DISCONTINUED] FLUCELVAX QUADRIVALENT 0.5 ML SUSY ADMINISTERED VIA PHARMACIST  . [DISCONTINUED] lisinopril (PRINIVIL,ZESTRIL) 5 MG tablet TAKE 1 TABLET BY MOUTH EACH DAY  . [DISCONTINUED] metoprolol tartrate (LOPRESSOR) 25 MG tablet Take 1 tablet (25 mg total) by mouth 2 (two) times daily.   No facility-administered encounter medications on file as of 12/09/2016.    No Known Allergies Family History  Problem Relation Age of Onset  . Stroke Mother   . Heart disease Father   . Heart attack Maternal Grandfather     Social History   Social History  . Marital status: Widowed    Spouse name: N/A  . Number of children: N/A  . Years of education: N/A   Occupational History  . Not on file.   Social History Main Topics  . Smoking status: Current Every Day Smoker    Packs/day: 1.00    Years: 30.00    Types: Cigarettes  . Smokeless tobacco: Never Used  . Alcohol use No  . Drug use: No  . Sexual activity: No   Other Topics Concern  . Not on file   Social History Narrative  . No narrative on file   Review of Systems  Constitutional: Negative.   Respiratory: Negative.   Cardiovascular: Negative.   Musculoskeletal: Positive for back pain and myalgias. Negative for arthralgias, gait problem, joint swelling, neck pain and neck stiffness.  Psychiatric/Behavioral: Negative.    Per HPI unless specifically indicated above     Objective:    BP 131/67 (BP Location: Left Arm, Patient Position: Sitting, Cuff Size: Normal)   Pulse 62   Temp 98.5 F (36.9 C)   Ht 5' 2.3" (1.582 m)    Wt 116 lb 6.4 oz (52.8 kg)   SpO2 97%   BMI 21.09 kg/m   Wt Readings from Last 3 Encounters:  12/09/16 116 lb 6.4 oz (52.8 kg)  06/07/16 119 lb (54 kg)  05/23/16 120 lb (54.4 kg)    Physical Exam  Results for orders placed or performed in visit on 02/18/16  TSH  Result Value Ref Range   TSH 0.66 0.35 - 4.50 uIU/mL  T4, free  Result Value Ref Range   Free T4 1.59 0.60 - 1.60 ng/dL  CBC w/Diff  Result Value Ref Range   WBC 8.9 4.0 - 10.5 K/uL   RBC 4.38 3.87 - 5.11 Mil/uL   Hemoglobin 13.6 12.0 - 15.0 g/dL   HCT 40.2 36.0 - 46.0 %   MCV 92.0 78.0 - 100.0 fl   MCHC 33.7 30.0 - 36.0 g/dL   RDW 13.9 11.5 - 15.5 %   Platelets 268.0 150.0 - 400.0 K/uL   Neutrophils Relative % 67.9 43.0 - 77.0 %   Lymphocytes Relative 16.7 12.0 - 46.0 %   Monocytes Relative 11.3 3.0 - 12.0 %   Eosinophils Relative 3.6 0.0 - 5.0 %   Basophils Relative 0.5 0.0 - 3.0 %  Neutro Abs 6.0 1.4 - 7.7 K/uL   Lymphs Abs 1.5 0.7 - 4.0 K/uL   Monocytes Absolute 1.0 0.1 - 1.0 K/uL   Eosinophils Absolute 0.3 0.0 - 0.7 K/uL   Basophils Absolute 0.0 0.0 - 0.1 K/uL  Comprehensive metabolic panel  Result Value Ref Range   Sodium 135 135 - 145 mEq/L   Potassium 4.0 3.5 - 5.1 mEq/L   Chloride 101 96 - 112 mEq/L   CO2 27 19 - 32 mEq/L   Glucose, Bld 98 70 - 99 mg/dL   BUN 29 (H) 6 - 23 mg/dL   Creatinine, Ser 1.21 (H) 0.40 - 1.20 mg/dL   Total Bilirubin 0.3 0.2 - 1.2 mg/dL   Alkaline Phosphatase 67 39 - 117 U/L   AST 18 0 - 37 U/L   ALT 12 0 - 35 U/L   Total Protein 7.3 6.0 - 8.3 g/dL   Albumin 3.8 3.5 - 5.2 g/dL   Calcium 9.6 8.4 - 10.5 mg/dL   GFR 45.84 (L) >60.00 mL/min  B12  Result Value Ref Range   Vitamin B-12 580 211 - 911 pg/mL      Assessment & Plan:   Problem List Items Addressed This Visit      Cardiovascular and Mediastinum   Coronary atherosclerosis    No pain. On lipitor and aspirin. Call with any concerns.       Relevant Medications   atorvastatin (LIPITOR) 20 MG tablet    amLODipine (NORVASC) 10 MG tablet   lisinopril (PRINIVIL,ZESTRIL) 5 MG tablet   metoprolol tartrate (LOPRESSOR) 25 MG tablet     Endocrine   Hypothyroidism    Will check levels today and adjust dose as needed. Call with any concerns.       Relevant Medications   metoprolol tartrate (LOPRESSOR) 25 MG tablet   Other Relevant Orders   Comprehensive metabolic panel   TSH     Musculoskeletal and Integument   Idiopathic scoliosis    Stable. Continue to monitor. Call with any concerns.         Genitourinary   Benign hypertensive renal disease    Under good control. Continue current regimen. Continue to monitor. Recheck 6 months.       Relevant Medications   amLODipine (NORVASC) 10 MG tablet   metoprolol tartrate (LOPRESSOR) 25 MG tablet   Other Relevant Orders   Comprehensive metabolic panel   Microalbumin, Urine Waived   Intrinsic ureteral obstruction    Continue to follow with nephrology at [facility]. Call with any concerns.       Hydronephrosis    Continue to follow with nephrology at [facility]. Call with any concerns.       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 Call with any concerns.         Other   Proteinuria    Rechecking microalbumin today. On lisinopril. Continue to follow with nephrology/urology.      Hyperlipidemia - Primary    Rechecking levels today. Continue current regimen. Continue to monitor. Call with any concerns.       Relevant Medications   atorvastatin (LIPITOR) 20 MG tablet   amLODipine (NORVASC) 10 MG tablet   lisinopril (PRINIVIL,ZESTRIL) 5 MG tablet   metoprolol tartrate (LOPRESSOR) 25 MG tablet   Other Relevant Orders   Comprehensive metabolic panel   Lipid Panel w/o Chol/HDL Ratio   Pernicious anemia    Rechecking levels today. Continue current regimen.  Continue to monitor. Call with any concerns.       Relevant Medications   cyanocobalamin (,VITAMIN B-12,) 1000 MCG/ML  injection   Other Relevant Orders   CBC with Differential/Platelet   Comprehensive metabolic panel   123456 and Folate Panel    Other Visit Diagnoses    Essential hypertension       Relevant Medications   atorvastatin (LIPITOR) 20 MG tablet   amLODipine (NORVASC) 10 MG tablet   lisinopril (PRINIVIL,ZESTRIL) 5 MG tablet   metoprolol tartrate (LOPRESSOR) 25 MG tablet       Follow up plan: Return in about 6 months (around 06/08/2017) for Wellness.

## 2016-12-09 NOTE — Patient Instructions (Addendum)
Steps to Quit Smoking Smoking tobacco can be harmful to your health and can affect almost every organ in your body. Smoking puts you, and those around you, at risk for developing many serious chronic diseases. Quitting smoking is difficult, but it is one of the best things that you can do for your health. It is never too late to quit. What are the benefits of quitting smoking? When you quit smoking, you lower your risk of developing serious diseases and conditions, such as:  Lung cancer or lung disease, such as COPD.  Heart disease.  Stroke.  Heart attack.  Infertility.  Osteoporosis and bone fractures.  Additionally, symptoms such as coughing, wheezing, and shortness of breath may get better when you quit. You may also find that you get sick less often because your body is stronger at fighting off colds and infections. If you are pregnant, quitting smoking can help to reduce your chances of having a baby of low birth weight. How do I get ready to quit? When you decide to quit smoking, create a plan to make sure that you are successful. Before you quit:  Pick a date to quit. Set a date within the next two weeks to give you time to prepare.  Write down the reasons why you are quitting. Keep this list in places where you will see it often, such as on your bathroom mirror or in your car or wallet.  Identify the people, places, things, and activities that make you want to smoke (triggers) and avoid them. Make sure to take these actions: ? Throw away all cigarettes at home, at work, and in your car. ? Throw away smoking accessories, such as ashtrays and lighters. ? Clean your car and make sure to empty the ashtray. ? Clean your home, including curtains and carpets.  Tell your family, friends, and coworkers that you are quitting. Support from your loved ones can make quitting easier.  Talk with your health care provider about your options for quitting smoking.  Find out what treatment  options are covered by your health insurance.  What strategies can I use to quit smoking? Talk with your healthcare provider about different strategies to quit smoking. Some strategies include:  Quitting smoking altogether instead of gradually lessening how much you smoke over a period of time. Research shows that quitting "cold turkey" is more successful than gradually quitting.  Attending in-person counseling to help you build problem-solving skills. You are more likely to have success in quitting if you attend several counseling sessions. Even short sessions of 10 minutes can be effective.  Finding resources and support systems that can help you to quit smoking and remain smoke-free after you quit. These resources are most helpful when you use them often. They can include: ? Online chats with a counselor. ? Telephone quitlines. ? Printed self-help materials. ? Support groups or group counseling. ? Text messaging programs. ? Mobile phone applications.  Taking medicines to help you quit smoking. (If you are pregnant or breastfeeding, talk with your health care provider first.) Some medicines contain nicotine and some do not. Both types of medicines help with cravings, but the medicines that include nicotine help to relieve withdrawal symptoms. Your health care provider may recommend: ? Nicotine patches, gum, or lozenges. ? Nicotine inhalers or sprays. ? Non-nicotine medicine that is taken by mouth.  Talk with your health care provider about combining strategies, such as taking medicines while you are also receiving in-person counseling. Using these two strategies together   makes you more likely to succeed in quitting than if you used either strategy on its own. If you are pregnant or breastfeeding, talk with your health care provider about finding counseling or other support strategies to quit smoking. Do not take medicine to help you quit smoking unless told to do so by your health care  provider. What things can I do to make it easier to quit? Quitting smoking might feel overwhelming at first, but there is a lot that you can do to make it easier. Take these important actions:  Reach out to your family and friends and ask that they support and encourage you during this time. Call telephone quitlines, reach out to support groups, or work with a counselor for support.  Ask people who smoke to avoid smoking around you.  Avoid places that trigger you to smoke, such as bars, parties, or smoke-break areas at work.  Spend time around people who do not smoke.  Lessen stress in your life, because stress can be a smoking trigger for some people. To lessen stress, try: ? Exercising regularly. ? Deep-breathing exercises. ? Yoga. ? Meditating. ? Performing a body scan. This involves closing your eyes, scanning your body from head to toe, and noticing which parts of your body are particularly tense. Purposefully relax the muscles in those areas.  Download or purchase mobile phone or tablet apps (applications) that can help you stick to your quit plan by providing reminders, tips, and encouragement. There are many free apps, such as QuitGuide from the CDC (Centers for Disease Control and Prevention). You can find other support for quitting smoking (smoking cessation) through smokefree.gov and other websites.  How will I feel when I quit smoking? Within the first 24 hours of quitting smoking, you may start to feel some withdrawal symptoms. These symptoms are usually most noticeable 2-3 days after quitting, but they usually do not last beyond 2-3 weeks. Changes or symptoms that you might experience include:  Mood swings.  Restlessness, anxiety, or irritation.  Difficulty concentrating.  Dizziness.  Strong cravings for sugary foods in addition to nicotine.  Mild weight gain.  Constipation.  Nausea.  Coughing or a sore throat.  Changes in how your medicines work in your  body.  A depressed mood.  Difficulty sleeping (insomnia).  After the first 2-3 weeks of quitting, you may start to notice more positive results, such as:  Improved sense of smell and taste.  Decreased coughing and sore throat.  Slower heart rate.  Lower blood pressure.  Clearer skin.  The ability to breathe more easily.  Fewer sick days.  Quitting smoking is very challenging for most people. Do not get discouraged if you are not successful the first time. Some people need to make many attempts to quit before they achieve long-term success. Do your best to stick to your quit plan, and talk with your health care provider if you have any questions or concerns. This information is not intended to replace advice given to you by your health care provider. Make sure you discuss any questions you have with your health care provider. Document Released: 11/01/2001 Document Revised: 07/05/2016 Document Reviewed: 03/24/2015 Elsevier Interactive Patient Education  2017 Elsevier Inc.  

## 2016-12-09 NOTE — Addendum Note (Signed)
Addended by: Valerie Roys on: 12/09/2016 02:18 PM   Modules accepted: Orders

## 2016-12-09 NOTE — Assessment & Plan Note (Signed)
No pain. On lipitor and aspirin. Call with any concerns.

## 2016-12-09 NOTE — Assessment & Plan Note (Signed)
Will check levels today and adjust dose as needed. Call with any concerns.

## 2016-12-09 NOTE — Assessment & Plan Note (Signed)
Continue to follow with nephrology at [facility]. Call with any concerns.

## 2016-12-09 NOTE — Assessment & Plan Note (Signed)
Under good control. Continue current regimen. Continue to monitor. Recheck 6 months.  

## 2016-12-09 NOTE — Assessment & Plan Note (Signed)
Rechecking microalbumin today. On lisinopril. Continue to follow with nephrology/urology.

## 2016-12-09 NOTE — Assessment & Plan Note (Signed)
Continue to follow with nephrology at [facility]. Call with any concerns.

## 2016-12-10 LAB — COMPREHENSIVE METABOLIC PANEL
ALT: 12 IU/L (ref 0–32)
AST: 18 IU/L (ref 0–40)
Albumin/Globulin Ratio: 1.3 (ref 1.2–2.2)
Albumin: 4 g/dL (ref 3.5–4.8)
Alkaline Phosphatase: 71 IU/L (ref 39–117)
BUN/Creatinine Ratio: 20 (ref 12–28)
BUN: 25 mg/dL (ref 8–27)
Bilirubin Total: <0.2 mg/dL (ref 0.0–1.2)
CO2: 26 mmol/L (ref 18–29)
Calcium: 9.8 mg/dL (ref 8.7–10.3)
Chloride: 95 mmol/L — ABNORMAL LOW (ref 96–106)
Creatinine, Ser: 1.25 mg/dL — ABNORMAL HIGH (ref 0.57–1.00)
GFR calc Af Amer: 48 mL/min/{1.73_m2} — ABNORMAL LOW (ref >59)
GFR calc non Af Amer: 42 mL/min/{1.73_m2} — ABNORMAL LOW (ref >59)
Globulin, Total: 3.1 g/dL (ref 1.5–4.5)
Glucose: 98 mg/dL (ref 65–99)
Potassium: 4.4 mmol/L (ref 3.5–5.2)
Sodium: 136 mmol/L (ref 134–144)
Total Protein: 7.1 g/dL (ref 6.0–8.5)

## 2016-12-10 LAB — LIPID PANEL W/O CHOL/HDL RATIO
Cholesterol, Total: 203 mg/dL — ABNORMAL HIGH (ref 100–199)
HDL: 78 mg/dL (ref >39)
LDL Calculated: 96 mg/dL (ref 0–99)
Triglycerides: 143 mg/dL (ref 0–149)
VLDL Cholesterol Cal: 29 mg/dL (ref 5–40)

## 2016-12-10 LAB — CBC WITH DIFFERENTIAL/PLATELET
Basophils Absolute: 0 10*3/uL (ref 0.0–0.2)
Basos: 0 %
EOS (ABSOLUTE): 0.1 10*3/uL (ref 0.0–0.4)
Eos: 1 %
Hematocrit: 41.6 % (ref 34.0–46.6)
Hemoglobin: 14 g/dL (ref 11.1–15.9)
Immature Grans (Abs): 0 10*3/uL (ref 0.0–0.1)
Immature Granulocytes: 0 %
Lymphocytes Absolute: 1.9 10*3/uL (ref 0.7–3.1)
Lymphs: 18 %
MCH: 31.5 pg (ref 26.6–33.0)
MCHC: 33.7 g/dL (ref 31.5–35.7)
MCV: 94 fL (ref 79–97)
Monocytes Absolute: 1 10*3/uL — ABNORMAL HIGH (ref 0.1–0.9)
Monocytes: 10 %
Neutrophils Absolute: 7.6 10*3/uL — ABNORMAL HIGH (ref 1.4–7.0)
Neutrophils: 71 %
Platelets: 282 10*3/uL (ref 150–379)
RBC: 4.44 x10E6/uL (ref 3.77–5.28)
RDW: 13.6 % (ref 12.3–15.4)
WBC: 10.7 10*3/uL (ref 3.4–10.8)

## 2016-12-10 LAB — B12 AND FOLATE PANEL
Folate: 9.5 ng/mL (ref >3.0)
Vitamin B-12: 1433 pg/mL — ABNORMAL HIGH (ref 232–1245)

## 2016-12-10 LAB — TSH: TSH: 2.61 u[IU]/mL (ref 0.450–4.500)

## 2016-12-12 ENCOUNTER — Telehealth: Payer: Self-pay | Admitting: Family Medicine

## 2016-12-12 LAB — MICROSCOPIC EXAMINATION
RBC, UA: NONE SEEN /hpf (ref 0–?)
WBC, UA: >30 /hpf — ABNORMAL HIGH (ref 0–?)

## 2016-12-12 LAB — UA/M W/RFLX CULTURE, ROUTINE
Bilirubin, UA: NEGATIVE
Glucose, UA: NEGATIVE
Ketones, UA: NEGATIVE
Nitrite, UA: NEGATIVE
Specific Gravity, UA: >1.03 — ABNORMAL HIGH (ref 1.005–1.030)
Urobilinogen, Ur: 0.2 mg/dL (ref 0.2–1.0)
pH, UA: 6 (ref 5.0–7.5)

## 2016-12-12 LAB — URINE CULTURE, REFLEX

## 2016-12-12 MED ORDER — CIPROFLOXACIN HCL 500 MG PO TABS
500.0000 mg | ORAL_TABLET | Freq: Two times a day (BID) | ORAL | 0 refills | Status: DC
Start: 2016-12-12 — End: 2017-05-04

## 2016-12-12 NOTE — Telephone Encounter (Signed)
Please let her know that her labs look good, but she had a UTI- I've sent medicine over to her pharmacy for her. Everything else looked stable. Thanks!

## 2016-12-12 NOTE — Telephone Encounter (Signed)
Patient notified

## 2016-12-13 ENCOUNTER — Telehealth: Payer: Self-pay | Admitting: Family Medicine

## 2016-12-13 MED ORDER — LEVOTHYROXINE SODIUM 175 MCG PO TABS: 175.0000 ug | ORAL_TABLET | Freq: Every day | ORAL | 3 refills | Status: DC

## 2016-12-13 NOTE — Telephone Encounter (Signed)
Patient called regarding her thyroid medication. She said she checked with the pharmacy but they told her they did not have an order for the thyroid med.  She is wanting to know if Dr Wynetta Emery has stopped this medication due to the last lab she had done.  Thank Dennis Bast Santiago Glad  [phone]

## 2016-12-13 NOTE — Telephone Encounter (Signed)
Patient notified

## 2016-12-13 NOTE — Telephone Encounter (Signed)
Do you want to continue the patients thyroid medication, if so can you please send a new prescription to her pharmacy.

## 2016-12-13 NOTE — Telephone Encounter (Signed)
Rx sent to her pharmacy 

## 2016-12-20 DIAGNOSIS — M79675 Pain in left toe(s): Secondary | ICD-10-CM | POA: Diagnosis not present

## 2016-12-20 DIAGNOSIS — M79674 Pain in right toe(s): Secondary | ICD-10-CM | POA: Diagnosis not present

## 2016-12-20 DIAGNOSIS — B351 Tinea unguium: Secondary | ICD-10-CM | POA: Diagnosis not present

## 2017-03-24 ENCOUNTER — Encounter: Payer: Self-pay | Admitting: *Deleted

## 2017-05-04 ENCOUNTER — Ambulatory Visit (INDEPENDENT_AMBULATORY_CARE_PROVIDER_SITE_OTHER): Payer: Medicare HMO

## 2017-05-04 VITALS — BP 130/60 | HR 78 | Temp 98.3°F | Resp 16 | Ht 63.0 in | Wt 115.7 lb

## 2017-05-04 DIAGNOSIS — Z1239 Encounter for other screening for malignant neoplasm of breast: Secondary | ICD-10-CM

## 2017-05-04 DIAGNOSIS — Z0000 Encounter for general adult medical examination without abnormal findings: Secondary | ICD-10-CM | POA: Diagnosis not present

## 2017-05-04 DIAGNOSIS — Z1231 Encounter for screening mammogram for malignant neoplasm of breast: Secondary | ICD-10-CM

## 2017-05-04 NOTE — Progress Notes (Signed)
Subjective:   Cathy Solis is a 76 y.o. female who presents for Medicare Annual (Subsequent) preventive examination.  Review of Systems:   Cardiac Risk Factors include: advanced age (>67men, >29 women);dyslipidemia;hypertension     Objective:     Vitals: BP 130/60 (BP Location: Left Arm, Patient Position: Sitting)   Pulse 78   Temp 98.3 F (36.8 C)   Resp 16   Ht 5\' 3"  (1.6 m)   Wt 115 lb 11.2 oz (52.5 kg)   BMI 20.50 kg/m   Body mass index is 20.5 kg/m.   Tobacco History  Smoking Status  . Current Every Day Smoker  . Packs/day: 1.00  . Years: 30.00  . Types: Cigarettes  Smokeless Tobacco  . Never Used     Ready to quit: Yes Counseling given: Yes   Past Medical History:  Diagnosis Date  . Breast cancer [facility]) 2006   Left breast, s/p radiation  . 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 Hypertension   . Kidney problem    Undeveloped R kidney  . 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 Personal history of tobacco use, presenting hazards to health 03/18/2016  . Stroke [facility])    residual left sided weakness  . Toe infection    followed by Dr. Jens Som   Past Surgical History:  Procedure Laterality Date  . ABDOMINAL HYSTERECTOMY    . BLADDER REPAIR    . BREAST SURGERY     left  . CAROTID ENDARTERECTOMY  2008   left  . COLONOSCOPY  2008  . EYE SURGERY Right 2013   cataract  . KIDNEY SURGERY  1949   Family History  Problem Relation Age of Onset  . Stroke Mother   . Heart disease Father   . Heart attack Maternal Grandfather    History  Sexual Activity  . Sexual activity: No    Outpatient Encounter Prescriptions as of 05/04/2017  Medication Sig  . amLODipine (NORVASC) 10 MG tablet Take 1 tablet (10 mg total) by mouth daily.  [name] aspirin 325 MG tablet Take 325 mg by mouth daily.    [name] atorvastatin (LIPITOR) 20 MG tablet TAKE 1 TABLET BY MOUTH EACH DAY  . levothyroxine (SYNTHROID, LEVOTHROID) 175 MCG tablet Take 1 tablet  (175 mcg total) by mouth daily.  [name] lisinopril (PRINIVIL,ZESTRIL) 5 MG tablet TAKE 1 TABLET BY MOUTH EACH DAY  . metoprolol tartrate (LOPRESSOR) 25 MG tablet Take 1 tablet (25 mg total) by mouth 2 (two) times daily.  . cyanocobalamin (,VITAMIN B-12,) 1000 MCG/ML injection INJECT 1ML INTRAMUSCULARLY ONCE EVERY 2-3 WEEKS (Patient not taking: Reported on 05/04/2017)  . [DISCONTINUED] ciprofloxacin (CIPRO) 500 MG tablet Take 1 tablet (500 mg total) by mouth 2 (two) times daily. (Patient not taking: Reported on 05/04/2017)   No facility-administered encounter medications on file as of 05/04/2017.     Activities of Daily Living In your present state of health, do you have any difficulty performing the following activities: 05/04/2017 12/09/2016  Hearing? N N  Vision? N N  Difficulty concentrating or making decisions? N N  Walking or climbing stairs? Y N  Dressing or bathing? N N  Doing errands, shopping? N N  Preparing Food and eating ? N -  Using the Toilet? N -  In the past six months, have you accidently leaked urine? Y -  Do you have problems with loss of bowel control? N -  Managing your Medications? N -  Managing your Finances? N -  Housekeeping or managing your Housekeeping? N -  Some recent data might be hidden    Patient Care Team: Valerie Roys, DO as PCP - General (Family Medicine) Christene Lye, MD (General Surgery)    Assessment:     Exercise Activities and Dietary recommendations Current Exercise Habits: The patient does not participate in regular exercise at present, Exercise limited by: orthopedic condition(s)  Goals    . Quit smoking / using tobacco           Decrease the amount of cigarettes smoked per day. Currently smokes 20 per day.  Decrease to 15, then to 10, then to 5, then to 2, then to 1 per day. Then to quit.    . Quit smoking / using tobacco          Discussed smoking cessation       Fall Risk Fall Risk  05/04/2017 12/09/2016 05/23/2016  06/30/2015 11/28/2014  Falls in the past year? No No No No No   Depression Screen PHQ 2/9 Scores 05/04/2017 12/09/2016 05/23/2016 06/30/2015  PHQ - 2 Score 0 0 0 0     Cognitive Function MMSE - Mini Mental State Exam 06/30/2015  Orientation to time 5  Orientation to Place 5  Registration 3  Attention/ Calculation 5  Recall 3  Language- name 2 objects 2  Language- repeat 1  Language- follow 3 step command 3  Language- read & follow direction 1  Write a sentence 1  Copy design 1  Total score 30     6CIT Screen 05/04/2017  What Year? 0 points  What month? 0 points  What time? 0 points  Count back from 20 0 points  Months in reverse 0 points  Repeat phrase 0 points  Total Score 0    Immunization History  Administered Date(s) Administered  . Influenza Split 08/05/2013  . Influenza-Unspecified 08/11/2015, 09/02/2016  . Pneumococcal Conjugate-13 11/28/2014  . Pneumococcal Polysaccharide-23 06/26/2012  . Tdap 06/26/2012   Screening Tests Health Maintenance  Topic Date Due  . INFLUENZA VACCINE  06/21/2017  . TETANUS/TDAP  06/26/2022  . DEXA SCAN  Completed  . PNA vac Low Risk Adult  Completed      Plan:    I have personally reviewed and addressed the Medicare Annual Wellness questionnaire and have noted the following in the patient's chart:  A. Medical and social history B. Use of alcohol, tobacco or illicit drugs  C. Current medications and supplements D. Functional ability and status E.  Nutritional status F.  Physical activity G. Advance directives H. List of other physicians I.  Hospitalizations, surgeries, and ER visits in previous 12 months J.  [place] such as hearing and vision if needed, cognitive and depression L. Referrals and appointments   In addition, I have reviewed and discussed with patient certain preventive protocols, quality metrics, and best practice recommendations. A written personalized care plan for preventive services as well as  general preventive health recommendations were provided to patient.   Signed,  Tyler Aas, LPN Nurse Health Advisor   MD Recommendations: needs refill on Vitamin B-12 injections.

## 2017-05-04 NOTE — Patient Instructions (Addendum)
Cathy Solis , Thank you for taking time to come for your Medicare Wellness Visit. I appreciate your ongoing commitment to your health goals. Please review the following plan we discussed and let me know if I can assist you in the future.   Screening recommendations/referrals: Colonoscopy: completed 05/28/2014 Mammogram: completed 07/29/2015  Bone Density: completed 07/23/2009 Recommended yearly ophthalmology/optometry visit for glaucoma screening and checkup Recommended yearly dental visit for hygiene and checkup  Vaccinations: Influenza vaccine: up to date, due 08/2017 Pneumococcal vaccine: up to date  Tdap vaccine: up to date  Shingles vaccine: due, check with your insurance company for coverage   Advanced directives: please bring a copy at your convenience.   Conditions/risks identified: Smoking cessation discussed.   Next appointment: Follow up in one year for your annual wellness exam.   Preventive Care 65 Years and Older, Female Preventive care refers to lifestyle choices and visits with your health care provider that can promote health and wellness. What does preventive care include?  A yearly physical exam. This is also called an annual well check.  Dental exams once or twice a year.  Routine eye exams. Ask your health care provider how often you should have your eyes checked.  Personal lifestyle choices, including:  Daily care of your teeth and gums.  Regular physical activity.  Eating a healthy diet.  Avoiding tobacco and drug use.  Limiting alcohol use.  Practicing safe sex.  Taking low-dose aspirin every day.  Taking vitamin and mineral supplements as recommended by your health care provider. What happens during an annual well check? The services and screenings done by your health care provider during your annual well check will depend on your age, overall health, lifestyle risk factors, and family history of disease. Counseling  Your health care provider may  ask you questions about your:  Alcohol use.  Tobacco use.  Drug use.  Emotional well-being.  Home and relationship well-being.  Sexual activity.  Eating habits.  History of falls.  Memory and ability to understand (cognition).  Work and work Statistician.  Reproductive health. Screening  You may have the following tests or measurements:  Height, weight, and BMI.  Blood pressure.  Lipid and cholesterol levels. These may be checked every 5 years, or more frequently if you are over 36 years old.  Skin check.  Lung cancer screening. You may have this screening every year starting at age 51 if you have a 30-pack-year history of smoking and currently smoke or have quit within the past 15 years.  Fecal occult blood test (FOBT) of the stool. You may have this test every year starting at age 29.  Flexible sigmoidoscopy or colonoscopy. You may have a sigmoidoscopy every 5 years or a colonoscopy every 10 years starting at age 51.  Hepatitis C blood test.  Hepatitis B blood test.  Sexually transmitted disease (STD) testing.  Diabetes screening. This is done by checking your blood sugar (glucose) after you have not eaten for a while (fasting). You may have this done every 1-3 years.  Bone density scan. This is done to screen for osteoporosis. You may have this done starting at age 70.  Mammogram. This may be done every 1-2 years. Talk to your health care provider about how often you should have regular mammograms. Talk with your health care provider about your test results, treatment options, and if necessary, the need for more tests. Vaccines  Your health care provider may recommend certain vaccines, such as:  Influenza vaccine. This  is recommended every year.  Tetanus, diphtheria, and acellular pertussis (Tdap, Td) vaccine. You may need a Td booster every 10 years.  Zoster vaccine. You may need this after age 87.  Pneumococcal 13-valent conjugate (PCV13) vaccine. One  dose is recommended after age 59.  Pneumococcal polysaccharide (PPSV23) vaccine. One dose is recommended after age 9. Talk to your health care provider about which screenings and vaccines you need and how often you need them. This information is not intended to replace advice given to you by your health care provider. Make sure you discuss any questions you have with your health care provider. Document Released: 12/04/2015 Document Revised: 07/27/2016 Document Reviewed: 09/08/2015 Elsevier Interactive Patient Education  2017 [place] Prevention in the Home Falls can cause injuries. They can happen to people of all ages. There are many things you can do to make your home safe and to help prevent falls. What can I do on the outside of my home?  Regularly fix the edges of walkways and driveways and fix any cracks.  Remove anything that might make you trip as you walk through a door, such as a raised step or threshold.  Trim any bushes or trees on the path to your home.  Use bright outdoor lighting.  Clear any walking paths of anything that might make someone trip, such as rocks or tools.  Regularly check to see if handrails are loose or broken. Make sure that both sides of any steps have handrails.  Any raised decks and porches should have guardrails on the edges.  Have any leaves, snow, or ice cleared regularly.  Use sand or salt on walking paths during winter.  Clean up any spills in your garage right away. This includes oil or grease spills. What can I do in the bathroom?  Use night lights.  Install grab bars by the toilet and in the tub and shower. Do not use towel bars as grab bars.  Use non-skid mats or decals in the tub or shower.  If you need to sit down in the shower, use a plastic, non-slip stool.  Keep the floor dry. Clean up any water that spills on the floor as soon as it happens.  Remove soap buildup in the tub or shower regularly.  Attach bath  mats securely with double-sided non-slip rug tape.  Do not have throw rugs and other things on the floor that can make you trip. What can I do in the bedroom?  Use night lights.  Make sure that you have a light by your bed that is easy to reach.  Do not use any sheets or blankets that are too big for your bed. They should not hang down onto the floor.  Have a firm chair that has side arms. You can use this for support while you get dressed.  Do not have throw rugs and other things on the floor that can make you trip. What can I do in the kitchen?  Clean up any spills right away.  Avoid walking on wet floors.  Keep items that you use a lot in easy-to-reach places.  If you need to reach something above you, use a strong step stool that has a grab bar.  Keep electrical cords out of the way.  Do not use floor polish or wax that makes floors slippery. If you must use wax, use non-skid floor wax.  Do not have throw rugs and other things on the floor that can make you  trip. What can I do with my stairs?  Do not leave any items on the stairs.  Make sure that there are handrails on both sides of the stairs and use them. Fix handrails that are broken or loose. Make sure that handrails are as long as the stairways.  Check any carpeting to make sure that it is firmly attached to the stairs. Fix any carpet that is loose or worn.  Avoid having throw rugs at the top or bottom of the stairs. If you do have throw rugs, attach them to the floor with carpet tape.  Make sure that you have a light switch at the top of the stairs and the bottom of the stairs. If you do not have them, ask someone to add them for you. What else can I do to help prevent falls?  Wear shoes that:  Do not have high heels.  Have rubber bottoms.  Are comfortable and fit you well.  Are closed at the toe. Do not wear sandals.  If you use a stepladder:  Make sure that it is fully opened. Do not climb a closed  stepladder.  Make sure that both sides of the stepladder are locked into place.  Ask someone to hold it for you, if possible.  Clearly mark and make sure that you can see:  Any grab bars or handrails.  First and last steps.  Where the edge of each step is.  Use tools that help you move around (mobility aids) if they are needed. These include:  Canes.  Walkers.  Scooters.  Crutches.  Turn on the lights when you go into a dark area. Replace any light bulbs as soon as they burn out.  Set up your furniture so you have a clear path. Avoid moving your furniture around.  If any of your floors are uneven, fix them.  If there are any pets around you, be aware of where they are.  Review your medicines with your doctor. Some medicines can make you feel dizzy. This can increase your chance of falling. Ask your doctor what other things that you can do to help prevent falls. This information is not intended to replace advice given to you by your health care provider. Make sure you discuss any questions you have with your health care provider. Document Released: 09/03/2009 Document Revised: 04/14/2016 Document Reviewed: 12/12/2014 Elsevier Interactive Patient Education  2017 [employer].

## 2017-05-16 ENCOUNTER — Encounter: Payer: Self-pay | Admitting: Family Medicine

## 2017-05-16 ENCOUNTER — Ambulatory Visit (INDEPENDENT_AMBULATORY_CARE_PROVIDER_SITE_OTHER): Payer: Medicare HMO | Admitting: Family Medicine

## 2017-05-16 VITALS — BP 138/76 | HR 53 | Temp 97.6°F | Wt 114.3 lb

## 2017-05-16 DIAGNOSIS — D510 Vitamin B12 deficiency anemia due to intrinsic factor deficiency: Secondary | ICD-10-CM | POA: Diagnosis not present

## 2017-05-16 DIAGNOSIS — I10 Essential (primary) hypertension: Secondary | ICD-10-CM | POA: Diagnosis not present

## 2017-05-16 DIAGNOSIS — Z720 Tobacco use: Secondary | ICD-10-CM

## 2017-05-16 DIAGNOSIS — E782 Mixed hyperlipidemia: Secondary | ICD-10-CM | POA: Diagnosis not present

## 2017-05-16 DIAGNOSIS — I129 Hypertensive chronic kidney disease with stage 1 through stage 4 chronic kidney disease, or unspecified chronic kidney disease: Secondary | ICD-10-CM

## 2017-05-16 LAB — MICROALBUMIN, URINE WAIVED
Creatinine, Urine Waived: 50 mg/dL (ref 10–300)
Microalb, Ur Waived: 150 mg/L — ABNORMAL HIGH (ref 0–19)
Microalb/Creat Ratio: >300 mg/g — ABNORMAL HIGH (ref <30)

## 2017-05-16 MED ORDER — CYANOCOBALAMIN 1000 MCG/ML IJ SOLN
1000.0000 ug | Freq: Once | INTRAMUSCULAR | Status: AC
  Administered 2017-05-16: 1000 ug via INTRAMUSCULAR

## 2017-05-16 MED ORDER — VARENICLINE TARTRATE 0.5 MG X 11 & 1 MG X 42 PO MISC: ORAL | 0 refills | Status: DC

## 2017-05-16 MED ORDER — AMLODIPINE BESYLATE 10 MG PO TABS: 10.0000 mg | ORAL_TABLET | Freq: Every day | ORAL | 1 refills | Status: DC

## 2017-05-16 MED ORDER — METOPROLOL TARTRATE 25 MG PO TABS: 25.0000 mg | ORAL_TABLET | Freq: Two times a day (BID) | ORAL | 1 refills | Status: DC

## 2017-05-16 MED ORDER — LISINOPRIL 5 MG PO TABS: ORAL_TABLET | ORAL | 1 refills | Status: DC

## 2017-05-16 MED ORDER — VARENICLINE TARTRATE 1 MG PO TABS: 1.0000 mg | ORAL_TABLET | Freq: Two times a day (BID) | ORAL | 1 refills | Status: DC

## 2017-05-16 MED ORDER — ROSUVASTATIN CALCIUM 10 MG PO TABS: 10.0000 mg | ORAL_TABLET | Freq: Every day | ORAL | 3 refills | Status: DC

## 2017-05-16 MED ORDER — CYANOCOBALAMIN 1000 MCG/ML IJ SOLN: INTRAMUSCULAR | 3 refills | Status: DC

## 2017-05-16 NOTE — Assessment & Plan Note (Signed)
B12 shot given today. Rechecking labs today. Await results.

## 2017-05-16 NOTE — Progress Notes (Signed)
BP 138/76 (BP Location: Left Arm, Patient Position: Sitting, Cuff Size: Small)   Pulse (!) 53   Temp 97.6 F (36.4 C)   Wt 114 lb 5 oz (51.9 kg)   SpO2 97%   BMI 20.25 kg/m    Subjective:    Patient ID: Cathy Solis, female    DOB: 03-Aug-1941, 76 y.o.   MRN: [id number]  HPI: Cathy Solis is a 76 y.o. female  Chief Complaint  Patient presents with  . Nicotine Dependence    Patient would like somethin to help her quit  . Hyperlipidemia  . Hypertension  . Anemia   HYPERTENSION / Point Satisfied with current treatment? yes Duration of hypertension: chronic BP monitoring frequency: not checking BP medication side effects: no Past BP meds: lisinopril, metoprolol Duration of hyperlipidemia: chronic Cholesterol medication side effects: yes- weakness Cholesterol supplements: none Past cholesterol medications: atorvastatin Medication compliance: excellent compliance Aspirin: yes Recent stressors: no Recurrent headaches: no Visual changes: no Palpitations: no Dyspnea: no Chest pain: no Lower extremity edema: no Dizzy/lightheaded: no  ANEMIA Anemia status: stable Etiology of anemia: perniciosus anemia Duration of anemia treatment: chronic Compliance with treatment: good compliance B12 supplementation side effects: no Severity of anemia: moderate Fatigue: yes Decreased exercise tolerance: no  Dyspnea on exertion: no Palpitations: no Bleeding: no Pica: no  SMOKING CESSATION Smoking Status: current everyday smoker Smoking Amount: 1/2 ppd Smoking Onset: 18-19yo Smoking Quit Date: ASAP Smoking triggers: all the time Type of tobacco use: cigarettes Children in the house: no Other household members who smoke: no Treatments attempted: patches- made her itch Pneumovax: up to date   Relevant past medical, surgical, family and social history reviewed and updated as indicated. Interim medical history since our last visit reviewed. Allergies and  medications reviewed and updated.  Review of Systems  Constitutional: Negative.   Respiratory: Negative.   Cardiovascular: Negative.   Musculoskeletal: Positive for back pain and myalgias. Negative for arthralgias, gait problem, joint swelling, neck pain and neck stiffness.  Skin: Negative.   Psychiatric/Behavioral: Negative.     Per HPI unless specifically indicated above     Objective:    BP 138/76 (BP Location: Left Arm, Patient Position: Sitting, Cuff Size: Small)   Pulse (!) 53   Temp 97.6 F (36.4 C)   Wt 114 lb 5 oz (51.9 kg)   SpO2 97%   BMI 20.25 kg/m   Wt Readings from Last 3 Encounters:  05/16/17 114 lb 5 oz (51.9 kg)  05/04/17 115 lb 11.2 oz (52.5 kg)  12/09/16 116 lb 6.4 oz (52.8 kg)    Physical Exam  Constitutional: She is oriented to person, place, and time. She appears well-developed and well-nourished. No distress.  HENT:  Head: Normocephalic and atraumatic.  Right Ear: Hearing normal.  Left Ear: Hearing normal.  Nose: Nose normal.  Eyes: Conjunctivae and lids are normal. Right eye exhibits no discharge. Left eye exhibits no discharge. No scleral icterus.  Cardiovascular: Normal rate, regular rhythm, normal heart sounds and intact distal pulses.  Exam reveals no gallop and no friction rub.   No murmur heard. Pulmonary/Chest: Effort normal and breath sounds normal. No respiratory distress. She has no wheezes. She has no rales. She exhibits no tenderness.  Musculoskeletal: Normal range of motion.  Neurological: She is alert and oriented to person, place, and time.  Skin: Skin is warm, dry and intact. No rash noted. She is not diaphoretic. No erythema. No pallor.  Psychiatric: She has a normal mood and  affect. Her speech is normal and behavior is normal. Judgment and thought content normal. Cognition and memory are normal.  Nursing note and vitals reviewed.   Results for orders placed or performed in visit on 12/09/16  Microscopic Examination  Result  Value Ref Range   WBC, UA >30 (H) 0 - 5 /hpf   RBC, UA None seen 0 - 2 /hpf   Epithelial Cells (non renal) 0-10 0 - 10 /hpf   Bacteria, UA Many (A) None seen/Few  CBC with Differential/Platelet  Result Value Ref Range   WBC 10.7 3.4 - 10.8 x10E3/uL   RBC 4.44 3.77 - 5.28 x10E6/uL   Hemoglobin 14.0 11.1 - 15.9 g/dL   Hematocrit 41.6 34.0 - 46.6 %   MCV 94 79 - 97 fL   MCH 31.5 26.6 - 33.0 pg   MCHC 33.7 31.5 - 35.7 g/dL   RDW 13.6 12.3 - 15.4 %   Platelets 282 150 - 379 x10E3/uL   Neutrophils 71 Not Estab. %   Lymphs 18 Not Estab. %   Monocytes 10 Not Estab. %   Eos 1 Not Estab. %   Basos 0 Not Estab. %   Neutrophils Absolute 7.6 (H) 1.4 - 7.0 x10E3/uL   Lymphocytes Absolute 1.9 0.7 - 3.1 x10E3/uL   Monocytes Absolute 1.0 (H) 0.1 - 0.9 x10E3/uL   EOS (ABSOLUTE) 0.1 0.0 - 0.4 x10E3/uL   Basophils Absolute 0.0 0.0 - 0.2 x10E3/uL   Immature Granulocytes 0 Not Estab. %   Immature Grans (Abs) 0.0 0.0 - 0.1 x10E3/uL  Comprehensive metabolic panel  Result Value Ref Range   Glucose 98 65 - 99 mg/dL   BUN 25 8 - 27 mg/dL   Creatinine, Ser 1.25 (H) 0.57 - 1.00 mg/dL   GFR calc non Af Amer 42 (L) >59 mL/min/1.73   GFR calc Af Amer 48 (L) >59 mL/min/1.73   BUN/Creatinine Ratio 20 12 - 28   Sodium 136 134 - 144 mmol/L   Potassium 4.4 3.5 - 5.2 mmol/L   Chloride 95 (L) 96 - 106 mmol/L   CO2 26 18 - 29 mmol/L   Calcium 9.8 8.7 - 10.3 mg/dL   Total Protein 7.1 6.0 - 8.5 g/dL   Albumin 4.0 3.5 - 4.8 g/dL   Globulin, Total 3.1 1.5 - 4.5 g/dL   Albumin/Globulin Ratio 1.3 1.2 - 2.2   Bilirubin Total <0.2 0.0 - 1.2 mg/dL   Alkaline Phosphatase 71 39 - 117 IU/L   AST 18 0 - 40 IU/L   ALT 12 0 - 32 IU/L  Lipid Panel w/o Chol/HDL Ratio  Result Value Ref Range   Cholesterol, Total 203 (H) 100 - 199 mg/dL   Triglycerides 143 0 - 149 mg/dL   HDL 78 >39 mg/dL   VLDL Cholesterol Cal 29 5 - 40 mg/dL   LDL Calculated 96 0 - 99 mg/dL  Microalbumin, Urine Waived  Result Value Ref Range    Microalb, Ur Waived 150 (H) 0 - 19 mg/L   Creatinine, Urine Waived 200 10 - 300 mg/dL   Microalb/Creat Ratio >300 (H) <30 mg/g  TSH  Result Value Ref Range   TSH 2.610 0.450 - 4.500 uIU/mL  B12 and Folate Panel  Result Value Ref Range   Vitamin B-12 1,433 (H) 232 - 1,245 pg/mL   Folate 9.5 >3.0 ng/mL  UA/M w/rflx Culture, Routine  Result Value Ref Range   Specific Gravity, UA >1.030 (H) 1.005 - 1.030   pH, UA 6.0 5.0 -  7.5   Color, UA Yellow Yellow   Appearance Ur Cloudy (A) Clear   Leukocytes, UA 1+ (A) Negative   Protein, UA 3+ (A) Negative/Trace   Glucose, UA Negative Negative   Ketones, UA Negative Negative   RBC, UA Trace (A) Negative   Bilirubin, UA Negative Negative   Urobilinogen, Ur 0.2 0.2 - 1.0 mg/dL   Nitrite, UA Negative Negative   Microscopic Examination See below:    Urinalysis Reflex Comment   Urine Culture, Routine  Result Value Ref Range   Urine Culture, Routine Final report    Organism ID, Bacteria Comment       Assessment & Plan:   Problem List Items Addressed This Visit      Genitourinary   Benign hypertensive renal disease - Primary    Under good control. Continue current regimen. Call with any concerns.       Relevant Medications   metoprolol tartrate (LOPRESSOR) 25 MG tablet   amLODipine (NORVASC) 10 MG tablet   Other Relevant Orders   Comprehensive metabolic panel   Microalbumin, Urine Waived     Other   Hyperlipidemia    Having weakness with atorvastatin. Will change to crestor and recheck in 1 month.       Relevant Medications   rosuvastatin (CRESTOR) 10 MG tablet   lisinopril (PRINIVIL,ZESTRIL) 5 MG tablet   metoprolol tartrate (LOPRESSOR) 25 MG tablet   amLODipine (NORVASC) 10 MG tablet   Other Relevant Orders   Comprehensive metabolic panel   Lipid Panel w/o Chol/HDL Ratio   Pernicious anemia    B12 shot given today. Rechecking labs today. Await results.       Relevant Medications   cyanocobalamin ((VITAMIN B-12))  injection 1,000 mcg (Completed)   cyanocobalamin (,VITAMIN B-12,) 1000 MCG/ML injection   Other Relevant Orders   CBC with Differential/Platelet   Comprehensive metabolic panel   V61   Tobacco abuse    Would like help quitting. Chantix prescribed today. Recheck in 1 month. Call with any concerns.        Other Visit Diagnoses    Essential hypertension       Relevant Medications   rosuvastatin (CRESTOR) 10 MG tablet   lisinopril (PRINIVIL,ZESTRIL) 5 MG tablet   metoprolol tartrate (LOPRESSOR) 25 MG tablet   amLODipine (NORVASC) 10 MG tablet       Follow up plan: Return in about 4 weeks (around 06/13/2017) for Follow up smoking.

## 2017-05-16 NOTE — Patient Instructions (Addendum)
Steps to Quit Smoking Smoking tobacco can be harmful to your health and can affect almost every organ in your body. Smoking puts you, and those around you, at risk for developing many serious chronic diseases. Quitting smoking is difficult, but it is one of the best things that you can do for your health. It is never too late to quit. What are the benefits of quitting smoking? When you quit smoking, you lower your risk of developing serious diseases and conditions, such as:  Lung cancer or lung disease, such as COPD.  Heart disease.  Stroke.  Heart attack.  Infertility.  Osteoporosis and bone fractures.  Additionally, symptoms such as coughing, wheezing, and shortness of breath may get better when you quit. You may also find that you get sick less often because your body is stronger at fighting off colds and infections. If you are pregnant, quitting smoking can help to reduce your chances of having a baby of low birth weight. How do I get ready to quit? When you decide to quit smoking, create a plan to make sure that you are successful. Before you quit:  Pick a date to quit. Set a date within the next two weeks to give you time to prepare.  Write down the reasons why you are quitting. Keep this list in places where you will see it often, such as on your bathroom mirror or in your car or wallet.  Identify the people, places, things, and activities that make you want to smoke (triggers) and avoid them. Make sure to take these actions: ? Throw away all cigarettes at home, at work, and in your car. ? Throw away smoking accessories, such as ashtrays and lighters. ? Clean your car and make sure to empty the ashtray. ? Clean your home, including curtains and carpets.  Tell your family, friends, and coworkers that you are quitting. Support from your loved ones can make quitting easier.  Talk with your health care provider about your options for quitting smoking.  Find out what treatment  options are covered by your health insurance.  What strategies can I use to quit smoking? Talk with your healthcare provider about different strategies to quit smoking. Some strategies include:  Quitting smoking altogether instead of gradually lessening how much you smoke over a period of time. Research shows that quitting "cold turkey" is more successful than gradually quitting.  Attending in-person counseling to help you build problem-solving skills. You are more likely to have success in quitting if you attend several counseling sessions. Even short sessions of 10 minutes can be effective.  Finding resources and support systems that can help you to quit smoking and remain smoke-free after you quit. These resources are most helpful when you use them often. They can include: ? Online chats with a counselor. ? Telephone quitlines. ? Printed self-help materials. ? Support groups or group counseling. ? Text messaging programs. ? Mobile phone applications.  Taking medicines to help you quit smoking. (If you are pregnant or breastfeeding, talk with your health care provider first.) Some medicines contain nicotine and some do not. Both types of medicines help with cravings, but the medicines that include nicotine help to relieve withdrawal symptoms. Your health care provider may recommend: ? Nicotine patches, gum, or lozenges. ? Nicotine inhalers or sprays. ? Non-nicotine medicine that is taken by mouth.  Talk with your health care provider about combining strategies, such as taking medicines while you are also receiving in-person counseling. Using these two strategies together   makes you more likely to succeed in quitting than if you used either strategy on its own. If you are pregnant or breastfeeding, talk with your health care provider about finding counseling or other support strategies to quit smoking. Do not take medicine to help you quit smoking unless told to do so by your health care  provider. What things can I do to make it easier to quit? Quitting smoking might feel overwhelming at first, but there is a lot that you can do to make it easier. Take these important actions:  Reach out to your family and friends and ask that they support and encourage you during this time. Call telephone quitlines, reach out to support groups, or work with a counselor for support.  Ask people who smoke to avoid smoking around you.  Avoid places that trigger you to smoke, such as bars, parties, or smoke-break areas at work.  Spend time around people who do not smoke.  Lessen stress in your life, because stress can be a smoking trigger for some people. To lessen stress, try: ? Exercising regularly. ? Deep-breathing exercises. ? Yoga. ? Meditating. ? Performing a body scan. This involves closing your eyes, scanning your body from head to toe, and noticing which parts of your body are particularly tense. Purposefully relax the muscles in those areas.  Download or purchase mobile phone or tablet apps (applications) that can help you stick to your quit plan by providing reminders, tips, and encouragement. There are many free apps, such as QuitGuide from the CDC (Centers for Disease Control and Prevention). You can find other support for quitting smoking (smoking cessation) through smokefree.gov and other websites.  How will I feel when I quit smoking? Within the first 24 hours of quitting smoking, you may start to feel some withdrawal symptoms. These symptoms are usually most noticeable 2-3 days after quitting, but they usually do not last beyond 2-3 weeks. Changes or symptoms that you might experience include:  Mood swings.  Restlessness, anxiety, or irritation.  Difficulty concentrating.  Dizziness.  Strong cravings for sugary foods in addition to nicotine.  Mild weight gain.  Constipation.  Nausea.  Coughing or a sore throat.  Changes in how your medicines work in your  body.  A depressed mood.  Difficulty sleeping (insomnia).  After the first 2-3 weeks of quitting, you may start to notice more positive results, such as:  Improved sense of smell and taste.  Decreased coughing and sore throat.  Slower heart rate.  Lower blood pressure.  Clearer skin.  The ability to breathe more easily.  Fewer sick days.  Quitting smoking is very challenging for most people. Do not get discouraged if you are not successful the first time. Some people need to make many attempts to quit before they achieve long-term success. Do your best to stick to your quit plan, and talk with your health care provider if you have any questions or concerns. This information is not intended to replace advice given to you by your health care provider. Make sure you discuss any questions you have with your health care provider. Document Released: 11/01/2001 Document Revised: 07/05/2016 Document Reviewed: 03/24/2015 Elsevier Interactive Patient Education  2017 Elsevier Inc.  

## 2017-05-16 NOTE — Assessment & Plan Note (Signed)
Would like help quitting. Chantix prescribed today. Recheck in 1 month. Call with any concerns.

## 2017-05-16 NOTE — Assessment & Plan Note (Signed)
Under good control. Continue current regimen. Call with any concerns.  

## 2017-05-16 NOTE — Assessment & Plan Note (Signed)
Having weakness with atorvastatin. Will change to crestor and recheck in 1 month.

## 2017-05-17 ENCOUNTER — Encounter: Payer: Self-pay | Admitting: Family Medicine

## 2017-05-17 LAB — COMPREHENSIVE METABOLIC PANEL
ALT: 11 IU/L (ref 0–32)
AST: 15 IU/L (ref 0–40)
Albumin/Globulin Ratio: 1.4 (ref 1.2–2.2)
Albumin: 4 g/dL (ref 3.5–4.8)
Alkaline Phosphatase: 65 IU/L (ref 39–117)
BUN/Creatinine Ratio: 20 (ref 12–28)
BUN: 23 mg/dL (ref 8–27)
Bilirubin Total: 0.3 mg/dL (ref 0.0–1.2)
CO2: 20 mmol/L (ref 20–29)
Calcium: 9.8 mg/dL (ref 8.7–10.3)
Chloride: 96 mmol/L (ref 96–106)
Creatinine, Ser: 1.15 mg/dL — ABNORMAL HIGH (ref 0.57–1.00)
GFR calc Af Amer: 53 mL/min/{1.73_m2} — ABNORMAL LOW (ref >59)
GFR calc non Af Amer: 46 mL/min/{1.73_m2} — ABNORMAL LOW (ref >59)
Globulin, Total: 2.8 g/dL (ref 1.5–4.5)
Glucose: 78 mg/dL (ref 65–99)
Potassium: 4.5 mmol/L (ref 3.5–5.2)
Sodium: 133 mmol/L — ABNORMAL LOW (ref 134–144)
Total Protein: 6.8 g/dL (ref 6.0–8.5)

## 2017-05-17 LAB — CBC WITH DIFFERENTIAL/PLATELET
Basophils Absolute: 0 10*3/uL (ref 0.0–0.2)
Basos: 0 %
EOS (ABSOLUTE): 0.1 10*3/uL (ref 0.0–0.4)
Eos: 1 %
Hematocrit: 40 % (ref 34.0–46.6)
Hemoglobin: 13.9 g/dL (ref 11.1–15.9)
Immature Grans (Abs): 0 10*3/uL (ref 0.0–0.1)
Immature Granulocytes: 0 %
Lymphocytes Absolute: 1.7 10*3/uL (ref 0.7–3.1)
Lymphs: 20 %
MCH: 31.7 pg (ref 26.6–33.0)
MCHC: 34.8 g/dL (ref 31.5–35.7)
MCV: 91 fL (ref 79–97)
Monocytes Absolute: 0.8 10*3/uL (ref 0.1–0.9)
Monocytes: 10 %
Neutrophils Absolute: 5.7 10*3/uL (ref 1.4–7.0)
Neutrophils: 69 %
Platelets: 274 10*3/uL (ref 150–379)
RBC: 4.39 x10E6/uL (ref 3.77–5.28)
RDW: 12.4 % (ref 12.3–15.4)
WBC: 8.3 10*3/uL (ref 3.4–10.8)

## 2017-05-17 LAB — LIPID PANEL W/O CHOL/HDL RATIO
Cholesterol, Total: 158 mg/dL (ref 100–199)
HDL: 59 mg/dL (ref >39)
LDL Calculated: 83 mg/dL (ref 0–99)
Triglycerides: 78 mg/dL (ref 0–149)
VLDL Cholesterol Cal: 16 mg/dL (ref 5–40)

## 2017-05-17 LAB — VITAMIN B12: Vitamin B-12: >2000 pg/mL — ABNORMAL HIGH (ref 232–1245)

## 2017-06-07 ENCOUNTER — Ambulatory Visit (INDEPENDENT_AMBULATORY_CARE_PROVIDER_SITE_OTHER): Payer: Medicare HMO | Admitting: General Surgery

## 2017-06-07 ENCOUNTER — Ambulatory Visit: Payer: Self-pay

## 2017-06-07 ENCOUNTER — Other Ambulatory Visit: Payer: Self-pay | Admitting: Family Medicine

## 2017-06-07 ENCOUNTER — Encounter: Payer: Self-pay | Admitting: General Surgery

## 2017-06-07 VITALS — BP 118/64 | HR 54 | Resp 12 | Ht 65.0 in | Wt 114.0 lb

## 2017-06-07 DIAGNOSIS — I6529 Occlusion and stenosis of unspecified carotid artery: Secondary | ICD-10-CM | POA: Diagnosis not present

## 2017-06-07 DIAGNOSIS — I6523 Occlusion and stenosis of bilateral carotid arteries: Secondary | ICD-10-CM | POA: Diagnosis not present

## 2017-06-07 NOTE — Progress Notes (Signed)
Patient ID: Cathy Solis, female   DOB: [birth year], 76 y.o.   MRN: [id number]  Chief Complaint  Patient presents with  . Follow-up    carotid ultrasound    HPI Cathy Solis is a 76 y.o. female.  Here for follow up   carotid stenosis. She states things have been going well. She denies any symptoms. She denies trouble swallowing. Carotid endarterectomy on left in 2008.  HPI  Past Medical History:  Diagnosis Date  . Breast cancer [facility]) 2006   Left breast, s/p radiation  . 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 Hypertension   . Kidney problem    Undeveloped R kidney  . 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 Personal history of tobacco use, presenting hazards to health 03/18/2016  . Stroke [facility])    residual left sided weakness  . Toe infection    followed by Dr. Jens Som    Past Surgical History:  Procedure Laterality Date  . ABDOMINAL HYSTERECTOMY    . BLADDER REPAIR    . BREAST SURGERY     left  . CAROTID ENDARTERECTOMY  2008   left  . COLONOSCOPY  2008  . EYE SURGERY Right 2013   cataract  . KIDNEY SURGERY  1949    Family History  Problem Relation Age of Onset  . Stroke Mother   . Heart disease Father   . Heart attack Maternal Grandfather     Social History Social History  Substance Use Topics  . Smoking status: Current Every Day Smoker    Packs/day: 1.00    Years: 30.00    Types: Cigarettes  . Smokeless tobacco: Never Used  . Alcohol use No    No Known Allergies  Current Outpatient Prescriptions  Medication Sig Dispense Refill  . amLODipine (NORVASC) 10 MG tablet Take 1 tablet (10 mg total) by mouth daily. 90 tablet 1  . aspirin 325 MG tablet Take 325 mg by mouth daily.      . calcium carbonate (CALCIUM 600) 600 MG TABS tablet Take by mouth.    . cyanocobalamin (,VITAMIN B-12,) 1000 MCG/ML injection INJECT 1ML INTRAMUSCULARLY ONCE EVERY 2-3 WEEKS 30 mL 3  . levothyroxine (SYNTHROID, LEVOTHROID) 175 MCG tablet Take 1  tablet (175 mcg total) by mouth daily. 90 tablet 3  . lisinopril (PRINIVIL,ZESTRIL) 5 MG tablet TAKE 1 TABLET BY MOUTH EACH DAY 90 tablet 1  . metoprolol tartrate (LOPRESSOR) 25 MG tablet Take 1 tablet (25 mg total) by mouth 2 (two) times daily. 180 tablet 1  . rosuvastatin (CRESTOR) 10 MG tablet Take 1 tablet (10 mg total) by mouth daily. 90 tablet 3   No current facility-administered medications for this visit.     Review of Systems Review of Systems  Constitutional: Negative.   Respiratory: Negative.   Cardiovascular: Negative.   Neurological: Negative for dizziness, syncope and light-headedness.    Blood pressure 118/64, pulse (!) 54, resp. rate 12, height 5\' 5"  (1.651 m), weight 114 lb (51.7 kg).  Physical Exam Physical Exam  Constitutional: She is oriented to person, place, and time. She appears well-developed and well-nourished.  HENT:  Mouth/Throat: Oropharynx is clear and moist.  Eyes: Conjunctivae are normal. No scleral icterus.  Neck: Neck supple.  Cardiovascular: Normal rate, regular rhythm and normal heart sounds.   Pulmonary/Chest: Effort normal and breath sounds normal.  Abdominal: Soft.  Lymphadenopathy:    She has no cervical adenopathy.  Neurological: She is alert  and oriented to person, place, and time.  Skin: Skin is warm and dry.  Psychiatric: Her behavior is normal.    Data Reviewed Prior notes   Carotid Duplex study was performed Stable findings with moderate plaquing in the right carotid bifurcation with less than 50% stenosis of the internal carotid artery  Assessment    Carotid stenosis with stable findings. Patient is asymptomatic Post left carotid endarterectomy in 2008.    Plan    Follow up in one year with [facility].      HPI, Physical Exam, Assessment and Plan have been scribed under the direction and in the presence of Mckinley Jewel, MD Karie Fetch, RN I have completed the exam and reviewed the above documentation  for accuracy and completeness.  I agree with the above.  Haematologist has been used and any errors in dictation or transcription are unintentional.  Kathaleen Dudziak G. Jamal Collin, M.D., F.A.C.S.   Karie Fetch M 06/07/2017, 10:46 AM

## 2017-06-07 NOTE — Patient Instructions (Addendum)
The patient is aware to call back for any questions or concerns. Follow up in one year with [facility].

## 2017-06-13 ENCOUNTER — Ambulatory Visit: Payer: Medicare HMO | Admitting: Family Medicine

## 2017-06-13 LAB — US CAROTID BILATERAL
Left CCA dist sys: 37.68 cm/s
Left ECA sys: 59.16 cm/s
Left ICA prox sys: 56.23 cm/s
Right ICA prox sys: 51.35 cm/s
Right cca dist sys: 41.59 cm/s
Right eca sys: 67.94 cm/s

## 2017-06-22 ENCOUNTER — Encounter: Payer: Self-pay | Admitting: Family Medicine

## 2017-06-22 ENCOUNTER — Ambulatory Visit (INDEPENDENT_AMBULATORY_CARE_PROVIDER_SITE_OTHER): Payer: Medicare HMO | Admitting: Family Medicine

## 2017-06-22 DIAGNOSIS — M4124 Other idiopathic scoliosis, thoracic region: Secondary | ICD-10-CM

## 2017-06-22 DIAGNOSIS — Z720 Tobacco use: Secondary | ICD-10-CM | POA: Diagnosis not present

## 2017-06-22 NOTE — Progress Notes (Signed)
BP 119/75 (BP Location: Left Arm, Patient Position: Sitting, Cuff Size: Normal)   Pulse (!) 58   Temp (!) 97.4 F (36.3 C)   Wt 112 lb 5 oz (50.9 kg)   SpO2 97%   BMI 18.69 kg/m    Subjective:    Patient ID: Cathy Solis, female    DOB: 1941/11/16, 76 y.o.   MRN: [id number]  HPI: Cathy Solis is a 76 y.o. female  Chief Complaint  Patient presents with  . Referral    Scoliosis   Has seen Dr. Sharlet Salina for her scoliosis. She states that he couldn't do anything. She would like to see one of the scoliosis clinics. She doesn't care if she goes to [facility] or [place]. Has had long standing minimal thoracic back pain with known scoliosis. Done PT and seen PM&R in the past.  No other concerns or complaints at this time.   SMOKING CESSATION- didn't take the chantix because it was going to cost her $400 Smoking Status: current everyday smoker Smoking Amount: 1/2 ppd Smoking Onset: 18-19yo Smoking Quit Date: ASAP Smoking triggers: all the time Type of tobacco use: cigarettes Children in the house: no Other household members who smoke: no Treatments attempted: patches- made her itch Pneumovax: up to date  Relevant past medical, surgical, family and social history reviewed and updated as indicated. Interim medical history since our last visit reviewed. Allergies and medications reviewed and updated.  Review of Systems  Constitutional: Negative.   Respiratory: Negative.   Cardiovascular: Negative.   Musculoskeletal: Positive for back pain and myalgias. Negative for arthralgias, gait problem, joint swelling, neck pain and neck stiffness.  Psychiatric/Behavioral: Negative.     Per HPI unless specifically indicated above     Objective:    BP 119/75 (BP Location: Left Arm, Patient Position: Sitting, Cuff Size: Normal)   Pulse (!) 58   Temp (!) 97.4 F (36.3 C)   Wt 112 lb 5 oz (50.9 kg)   SpO2 97%   BMI 18.69 kg/m   Wt Readings from Last 3 Encounters:  06/22/17 112 lb 5  oz (50.9 kg)  06/07/17 114 lb (51.7 kg)  05/16/17 114 lb 5 oz (51.9 kg)    Physical Exam  Constitutional: She is oriented to person, place, and time. She appears well-developed and well-nourished. No distress.  HENT:  Head: Normocephalic and atraumatic.  Right Ear: Hearing normal.  Left Ear: Hearing normal.  Nose: Nose normal.  Eyes: Conjunctivae and lids are normal. Right eye exhibits no discharge. Left eye exhibits no discharge. No scleral icterus.  Cardiovascular: Normal rate, regular rhythm, normal heart sounds and intact distal pulses.  Exam reveals no gallop and no friction rub.   No murmur heard. Pulmonary/Chest: Effort normal and breath sounds normal. No respiratory distress. She has no wheezes. She has no rales. She exhibits no tenderness.  Musculoskeletal: Normal range of motion. She exhibits deformity.  Neurological: She is alert and oriented to person, place, and time.  Skin: Skin is warm, dry and intact. No rash noted. No erythema. No pallor.  Psychiatric: She has a normal mood and affect. Her speech is normal and behavior is normal. Judgment and thought content normal. Cognition and memory are normal.  Nursing note and vitals reviewed.   Results for orders placed or performed in visit on 06/07/17  US Carotid Bilateral  Result Value Ref Range   Right cca dist sys 41.59 cm/s   Right ICA prox sys 51.35 cm/s   Right eca  sys 67.94 cm/s   Left CCA dist sys 37.68 cm/s   Left ICA prox sys 56.23 cm/s   Left ECA sys 59.16 cm/s      Assessment & Plan:   Problem List Items Addressed This Visit      Musculoskeletal and Integument   Idiopathic scoliosis    Will refer to scoliosis and spine center. Referral generated today. Call with any concerns.         Other   Tobacco abuse    Was not able to afford chantix- called to her pharmacy and her insurance doesn't cover it. Will call the company and see if she can get it lower cost.       Relevant Orders   Ambulatory  referral to Spine Surgery       Follow up plan: Return in about 6 months (around 12/23/2017) for follow up.

## 2017-06-22 NOTE — Assessment & Plan Note (Signed)
Will refer to scoliosis and spine center. Referral generated today. Call with any concerns.

## 2017-06-22 NOTE — Assessment & Plan Note (Addendum)
Was not able to afford chantix- called to her pharmacy and her insurance doesn't cover it. Will call the company and see if she can get it lower cost.

## 2017-07-04 ENCOUNTER — Telehealth: Payer: Self-pay | Admitting: Family Medicine

## 2017-07-04 NOTE — Telephone Encounter (Signed)
Patient called to check on the status of the referral for her back issue.  Patient states she has not heard anything and it has been 2 weeks.  She said a message could be left on her answering machine if she does not answer  [phone] or her cell [phone]  Thank you

## 2017-07-04 NOTE — Telephone Encounter (Signed)
Called patient with the information regarding referral

## 2017-07-04 NOTE — Telephone Encounter (Signed)
The referral was faxed w/ confirmation on 06/23/2017. It can take up to 30 days for [facility] due to high demand in the area.  Patient can call and to see the status of the appointment.  [phone]

## 2017-07-26 DIAGNOSIS — M4120 Other idiopathic scoliosis, site unspecified: Secondary | ICD-10-CM | POA: Diagnosis not present

## 2017-07-26 DIAGNOSIS — M549 Dorsalgia, unspecified: Secondary | ICD-10-CM | POA: Diagnosis not present

## 2017-07-27 ENCOUNTER — Other Ambulatory Visit: Payer: Self-pay | Admitting: Physician Assistant

## 2017-07-27 DIAGNOSIS — R5381 Other malaise: Secondary | ICD-10-CM

## 2017-08-04 ENCOUNTER — Other Ambulatory Visit: Payer: Self-pay | Admitting: Physician Assistant

## 2017-08-04 DIAGNOSIS — M8580 Other specified disorders of bone density and structure, unspecified site: Secondary | ICD-10-CM

## 2017-08-10 ENCOUNTER — Other Ambulatory Visit: Payer: Self-pay | Admitting: Physician Assistant

## 2017-08-10 DIAGNOSIS — M4120 Other idiopathic scoliosis, site unspecified: Secondary | ICD-10-CM

## 2017-08-11 ENCOUNTER — Other Ambulatory Visit: Payer: Self-pay | Admitting: Physician Assistant

## 2017-08-11 DIAGNOSIS — M4120 Other idiopathic scoliosis, site unspecified: Secondary | ICD-10-CM

## 2017-08-14 DIAGNOSIS — R69 Illness, unspecified: Secondary | ICD-10-CM | POA: Diagnosis not present

## 2017-08-23 ENCOUNTER — Ambulatory Visit
Admission: RE | Admit: 2017-08-23 | Discharge: 2017-08-23 | Disposition: A | Discharge status: Home / Self Care | Payer: Medicare HMO | Source: Ambulatory Visit | Attending: Physician Assistant | Admitting: Physician Assistant

## 2017-08-23 ENCOUNTER — Other Ambulatory Visit: Payer: Self-pay | Admitting: Family Medicine

## 2017-08-23 DIAGNOSIS — M4120 Other idiopathic scoliosis, site unspecified: Secondary | ICD-10-CM

## 2017-08-23 DIAGNOSIS — M48061 Spinal stenosis, lumbar region without neurogenic claudication: Secondary | ICD-10-CM | POA: Diagnosis not present

## 2017-08-23 DIAGNOSIS — Z1231 Encounter for screening mammogram for malignant neoplasm of breast: Secondary | ICD-10-CM

## 2017-08-23 DIAGNOSIS — M4804 Spinal stenosis, thoracic region: Secondary | ICD-10-CM | POA: Diagnosis not present

## 2017-08-23 DIAGNOSIS — M546 Pain in thoracic spine: Secondary | ICD-10-CM | POA: Diagnosis not present

## 2017-08-23 DIAGNOSIS — M545 Low back pain: Secondary | ICD-10-CM | POA: Diagnosis not present

## 2017-09-13 ENCOUNTER — Ambulatory Visit
Admission: RE | Admit: 2017-09-13 | Discharge: 2017-09-13 | Disposition: A | Discharge status: Home / Self Care | Payer: Medicare HMO | Source: Ambulatory Visit | Attending: Family Medicine | Admitting: Family Medicine

## 2017-09-13 ENCOUNTER — Encounter: Payer: Self-pay | Admitting: Family Medicine

## 2017-09-13 ENCOUNTER — Ambulatory Visit
Admission: RE | Admit: 2017-09-13 | Discharge: 2017-09-13 | Disposition: A | Discharge status: Home / Self Care | Payer: Medicare HMO | Source: Ambulatory Visit | Attending: Physician Assistant | Admitting: Physician Assistant

## 2017-09-13 DIAGNOSIS — Z1231 Encounter for screening mammogram for malignant neoplasm of breast: Secondary | ICD-10-CM

## 2017-09-13 DIAGNOSIS — M810 Age-related osteoporosis without current pathological fracture: Secondary | ICD-10-CM | POA: Diagnosis not present

## 2017-09-13 DIAGNOSIS — M8580 Other specified disorders of bone density and structure, unspecified site: Secondary | ICD-10-CM

## 2017-09-15 ENCOUNTER — Telehealth: Payer: Self-pay | Admitting: Family Medicine

## 2017-09-15 NOTE — Telephone Encounter (Signed)
Noted  

## 2017-09-15 NOTE — Telephone Encounter (Signed)
Routing to provider  

## 2017-09-15 NOTE — Telephone Encounter (Signed)
Copied from [place] [id number]. Topic: Quick Communication - See Telephone Encounter >> Sep 15, 2017  1:39 PM Robina Ade, Helene Kelp D wrote: Reason for CRM: Earnest Bailey is a nurse with [employer] and was calling to give Dr. Wynetta Emery a FYI that patient has not filed her rosuvastatin and its been a month ago that pt finished it. [facility]'s number is [phone] if she needs to talk to her.

## 2017-09-20 DIAGNOSIS — M810 Age-related osteoporosis without current pathological fracture: Secondary | ICD-10-CM | POA: Diagnosis not present

## 2017-09-20 DIAGNOSIS — M4150 Other secondary scoliosis, site unspecified: Secondary | ICD-10-CM | POA: Diagnosis not present

## 2017-11-01 ENCOUNTER — Ambulatory Visit: Payer: Medicare HMO | Admitting: Endocrinology

## 2017-11-02 ENCOUNTER — Ambulatory Visit: Payer: Medicare HMO | Admitting: Family Medicine

## 2017-11-02 ENCOUNTER — Ambulatory Visit (INDEPENDENT_AMBULATORY_CARE_PROVIDER_SITE_OTHER): Payer: Medicare HMO | Admitting: Family Medicine

## 2017-11-02 ENCOUNTER — Encounter: Payer: Self-pay | Admitting: Family Medicine

## 2017-11-02 VITALS — BP 122/60 | HR 64 | Temp 97.4°F | Wt 114.0 lb

## 2017-11-02 DIAGNOSIS — M810 Age-related osteoporosis without current pathological fracture: Secondary | ICD-10-CM | POA: Insufficient documentation

## 2017-11-02 DIAGNOSIS — E782 Mixed hyperlipidemia: Secondary | ICD-10-CM

## 2017-11-02 DIAGNOSIS — I129 Hypertensive chronic kidney disease with stage 1 through stage 4 chronic kidney disease, or unspecified chronic kidney disease: Secondary | ICD-10-CM

## 2017-11-02 MED ORDER — ALENDRONATE SODIUM 70 MG PO TABS: 70.0000 mg | ORAL_TABLET | ORAL | 11 refills | Status: DC

## 2017-11-02 MED ORDER — EZETIMIBE 10 MG PO TABS: 10.0000 mg | ORAL_TABLET | Freq: Every day | ORAL | 1 refills | Status: DC

## 2017-11-02 NOTE — Assessment & Plan Note (Signed)
Under good control on recheck. Continue current regimen. Continue to monitor. Call with any concerns.  

## 2017-11-02 NOTE — Assessment & Plan Note (Signed)
Statin intolerant. Will start her on zetia to see if it helps. Rx given today. Recheck labs at follow up.

## 2017-11-02 NOTE — Progress Notes (Signed)
BP 122/60 (BP Location: Left Arm, Cuff Size: Normal)   Pulse 64   Temp (!) 97.4 F (36.3 C)   Wt 114 lb (51.7 kg)   SpO2 98%   BMI 18.97 kg/m    Subjective:    Patient ID: Cathy Solis, female    DOB: [birth year], 76 y.o.   MRN: [id number]  HPI: Cathy Solis is a 76 y.o. female  Chief Complaint  Patient presents with  . bone density  . Hyperlipidemia    Patient stopped her Rosuvastatin due to cramps, she would like to get a different medication   Had her bone density done in October, looks like was ordered by another provider. T score -2.6. To see endocrinology in January, but wants to have surgery done on her back and they won't do it until her bones are stronger. Would like to be put on something.   HYPERLIPIDEMIA- statin-intolerant, could not tolerate crestor. Needs to be on other medicine. Hyperlipidemia status: poor compliance Satisfied with current treatment?  no Side effects:  yes- severe myalgias Medication compliance: poor compliance Past cholesterol meds: simvastatin, atorvastatin, crestor Supplements: none Aspirin:  no The 10-year ASCVD risk score Mikey Bussing DC Jr., et al., 2013) is: 34.4%   Values used to calculate the score:     Age: 52 years     Sex: Female     Is Non-Hispanic African American: No     Diabetic: No     Tobacco smoker: Yes     Systolic Blood Pressure: 833 mmHg     Is BP treated: Yes     HDL Cholesterol: 59 mg/dL     Total Cholesterol: 158 mg/dL Chest pain:  no Coronary artery disease:  no Family history CAD:  yes  HYPERTENSION Hypertension status: stable  Satisfied with current treatment? yes Duration of hypertension: chronic BP monitoring frequency:  not checking BP medication side effects:  no Medication compliance: excellent compliance Previous BP meds: amlodipine and metorprolol Aspirin: no Recurrent headaches: no Visual changes: no Palpitations: no Dyspnea: no Chest pain: no Lower extremity edema: no Dizzy/lightheaded:  no   Relevant past medical, surgical, family and social history reviewed and updated as indicated. Interim medical history since our last visit reviewed. Allergies and medications reviewed and updated.  Review of Systems  Constitutional: Negative.   Respiratory: Negative.   Cardiovascular: Negative.   Musculoskeletal: Positive for myalgias. Negative for arthralgias, back pain, gait problem, joint swelling and neck pain.  Neurological: Negative.   Psychiatric/Behavioral: Negative.     Per HPI unless specifically indicated above     Objective:    BP 122/60 (BP Location: Left Arm, Cuff Size: Normal)   Pulse 64   Temp (!) 97.4 F (36.3 C)   Wt 114 lb (51.7 kg)   SpO2 98%   BMI 18.97 kg/m   Wt Readings from Last 3 Encounters:  11/02/17 114 lb (51.7 kg)  06/22/17 112 lb 5 oz (50.9 kg)  06/07/17 114 lb (51.7 kg)    Physical Exam  Constitutional: She is oriented to person, place, and time. She appears well-developed and well-nourished. No distress.  HENT:  Head: Normocephalic and atraumatic.  Right Ear: Hearing normal.  Left Ear: Hearing normal.  Nose: Nose normal.  Eyes: Conjunctivae and lids are normal. Right eye exhibits no discharge. Left eye exhibits no discharge. No scleral icterus.  Cardiovascular: Normal rate, regular rhythm, normal heart sounds and intact distal pulses. Exam reveals no gallop and no friction rub.  No murmur  heard. Pulmonary/Chest: Effort normal and breath sounds normal. No respiratory distress. She has no wheezes. She has no rales. She exhibits no tenderness.  Musculoskeletal: Normal range of motion.  Neurological: She is alert and oriented to person, place, and time.  Skin: Skin is warm, dry and intact. No rash noted. She is not diaphoretic. No erythema. No pallor.  Psychiatric: She has a normal mood and affect. Her speech is normal and behavior is normal. Judgment and thought content normal. Cognition and memory are normal.  Nursing note and vitals  reviewed.   Results for orders placed or performed in visit on 06/07/17  US Carotid Bilateral  Result Value Ref Range   Right cca dist sys 41.59 cm/s   Right ICA prox sys 51.35 cm/s   Right eca sys 67.94 cm/s   Left CCA dist sys 37.68 cm/s   Left ICA prox sys 56.23 cm/s   Left ECA sys 59.16 cm/s      Assessment & Plan:   Problem List Items Addressed This Visit      Musculoskeletal and Integument   Osteoporosis    Has never been on bisphosphinate before. Will start fosmax. Keep appointment with endocrine in January. Call with any concerns.       Relevant Medications   alendronate (FOSAMAX) 70 MG tablet     Genitourinary   Benign hypertensive renal disease    Under good control on recheck. Continue current regimen. Continue to monitor. Call with any concerns.         Other   Hyperlipidemia - Primary    Statin intolerant. Will start her on zetia to see if it helps. Rx given today. Recheck labs at follow up.      Relevant Medications   ezetimibe (ZETIA) 10 MG tablet       Follow up plan: Return As scheduled.

## 2017-11-02 NOTE — Assessment & Plan Note (Signed)
Has never been on bisphosphinate before. Will start fosmax. Keep appointment with endocrine in January. Call with any concerns.

## 2017-11-08 DIAGNOSIS — Z7983 Long term (current) use of bisphosphonates: Secondary | ICD-10-CM | POA: Diagnosis not present

## 2017-11-08 DIAGNOSIS — M818 Other osteoporosis without current pathological fracture: Secondary | ICD-10-CM | POA: Diagnosis not present

## 2017-11-08 DIAGNOSIS — E039 Hypothyroidism, unspecified: Secondary | ICD-10-CM | POA: Diagnosis not present

## 2017-11-08 DIAGNOSIS — E789 Disorder of lipoprotein metabolism, unspecified: Secondary | ICD-10-CM | POA: Diagnosis not present

## 2017-11-08 DIAGNOSIS — Z79899 Other long term (current) drug therapy: Secondary | ICD-10-CM | POA: Diagnosis not present

## 2017-11-08 DIAGNOSIS — K08109 Complete loss of teeth, unspecified cause, unspecified class: Secondary | ICD-10-CM | POA: Diagnosis not present

## 2017-11-08 DIAGNOSIS — Z6820 Body mass index (BMI) 20.0-20.9, adult: Secondary | ICD-10-CM | POA: Diagnosis not present

## 2017-11-08 DIAGNOSIS — R69 Illness, unspecified: Secondary | ICD-10-CM | POA: Diagnosis not present

## 2017-11-08 DIAGNOSIS — Z0000 Encounter for general adult medical examination without abnormal findings: Secondary | ICD-10-CM | POA: Diagnosis not present

## 2017-11-08 DIAGNOSIS — I10 Essential (primary) hypertension: Secondary | ICD-10-CM | POA: Diagnosis not present

## 2017-12-06 ENCOUNTER — Other Ambulatory Visit: Payer: Self-pay | Admitting: Family Medicine

## 2017-12-06 DIAGNOSIS — E039 Hypothyroidism, unspecified: Secondary | ICD-10-CM

## 2017-12-06 NOTE — Telephone Encounter (Signed)
Notified patient of the 4 dollar list, she states that this medication is causing her to have cramps, she has an appointment on 12/25/17, she will discuss it with you at that time.

## 2017-12-06 NOTE — Telephone Encounter (Signed)
Last OV and TSH done 12/09/16.

## 2017-12-06 NOTE — Telephone Encounter (Signed)
Please have her come in for blood work

## 2017-12-06 NOTE — Telephone Encounter (Signed)
That medicine is on the [facility] $4 list without insurance. There is no way it should be costing her $200

## 2017-12-06 NOTE — Telephone Encounter (Signed)
Patient notified, she states that she may not take the medication again because it cost her $200.00, she will come in for the blood work today or tomorrow

## 2017-12-07 ENCOUNTER — Other Ambulatory Visit: Payer: Medicare HMO

## 2017-12-07 DIAGNOSIS — E039 Hypothyroidism, unspecified: Secondary | ICD-10-CM | POA: Diagnosis not present

## 2017-12-08 ENCOUNTER — Telehealth: Payer: Self-pay | Admitting: Family Medicine

## 2017-12-08 LAB — TSH: TSH: 1.16 u[IU]/mL (ref 0.450–4.500)

## 2017-12-08 MED ORDER — LEVOTHYROXINE SODIUM 175 MCG PO TABS: 175.0000 ug | ORAL_TABLET | Freq: Every day | ORAL | 3 refills | Status: DC

## 2017-12-08 NOTE — Telephone Encounter (Signed)
Please let her know that her thyroid came back normal so I've sent a year supply of her medicine to her pharmacy. Thanks!

## 2017-12-08 NOTE — Telephone Encounter (Signed)
Rx sent to her pharmacy 

## 2017-12-08 NOTE — Telephone Encounter (Signed)
Please send medication to [place], so that she can get it off the 4 dollar list.

## 2017-12-12 ENCOUNTER — Other Ambulatory Visit: Payer: Self-pay | Admitting: Family Medicine

## 2017-12-12 DIAGNOSIS — I10 Essential (primary) hypertension: Secondary | ICD-10-CM

## 2017-12-14 ENCOUNTER — Ambulatory Visit: Payer: Medicare HMO | Admitting: Endocrinology

## 2017-12-14 ENCOUNTER — Encounter: Payer: Self-pay | Admitting: Endocrinology

## 2017-12-14 VITALS — BP 118/64 | HR 55 | Ht 65.0 in | Wt 114.0 lb

## 2017-12-14 DIAGNOSIS — Z720 Tobacco use: Secondary | ICD-10-CM

## 2017-12-14 DIAGNOSIS — M4126 Other idiopathic scoliosis, lumbar region: Secondary | ICD-10-CM

## 2017-12-14 DIAGNOSIS — M810 Age-related osteoporosis without current pathological fracture: Secondary | ICD-10-CM | POA: Diagnosis not present

## 2017-12-14 LAB — CBC WITH DIFFERENTIAL/PLATELET
Basophils Absolute: 0.1 10*3/uL (ref 0.0–0.1)
Basophils Relative: 1.2 % (ref 0.0–3.0)
Eosinophils Absolute: 0.1 10*3/uL (ref 0.0–0.7)
Eosinophils Relative: 1.4 % (ref 0.0–5.0)
HCT: 40.2 % (ref 36.0–46.0)
Hemoglobin: 13.4 g/dL (ref 12.0–15.0)
Lymphocytes Relative: 20.4 % (ref 12.0–46.0)
Lymphs Abs: 1.9 10*3/uL (ref 0.7–4.0)
MCHC: 33.5 g/dL (ref 30.0–36.0)
MCV: 94.3 fl (ref 78.0–100.0)
Monocytes Absolute: 1.1 10*3/uL — ABNORMAL HIGH (ref 0.1–1.0)
Monocytes Relative: 11.3 % (ref 3.0–12.0)
Neutro Abs: 6.2 10*3/uL (ref 1.4–7.7)
Neutrophils Relative %: 65.7 % (ref 43.0–77.0)
Platelets: 274 10*3/uL (ref 150.0–400.0)
RBC: 4.26 Mil/uL (ref 3.87–5.11)
RDW: 13.5 % (ref 11.5–15.5)
WBC: 9.4 10*3/uL (ref 4.0–10.5)

## 2017-12-14 LAB — VITAMIN D 25 HYDROXY (VIT D DEFICIENCY, FRACTURES): VITD: 9.86 ng/mL — ABNORMAL LOW (ref 30.00–100.00)

## 2017-12-14 MED ORDER — VITAMIN D (ERGOCALCIFEROL) 1.25 MG (50000 UNIT) PO CAPS: 50000.0000 [IU] | ORAL_CAPSULE | ORAL | 3 refills | Status: DC

## 2017-12-14 NOTE — Progress Notes (Signed)
Patient ID: Cathy Solis, female   DOB: 03/24/1941, 77 y.o.   MRN: [id number]          Referring Physician: Cyndy Freeze   Chief complaint: Low back pain  History of Present Illness:  The patient is referred here for osteoporosis.  She had a screening bone density done in 08/2017 by her neurosurgeon and this showed the following T-scores: Right total femur: -2.6 and left total femur -2.2 Spine: Not able to measure Left forearm -1.8  FRAX scores are not available   The patient was evaluated for osteoporosis since she is a candidate for scoliosis surgery  She has no history of low trauma fracture and no vertebral compression or fracture is noted on her thoracic and lumbar spine scans  Previous treatment: She has just been started on Fosamax in 10/2017 by her PCP, taking 70 mg weekly Does not have any heartburn, abdominal pain or reflux symptoms with this  Calcium supplements: None Vitamin D supplements: None    LABS:  No results found for: VD25OH   Past Medical History:  Diagnosis Date  . Breast cancer [facility]) 2006   Left breast, s/p radiation  . 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 Hypertension   . Kidney problem    Undeveloped R kidney  . 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 Personal history of tobacco use, presenting hazards to health 03/18/2016  . Stroke [facility])    residual left sided weakness  . Toe infection    followed by Dr. Jens Som    Past Surgical History:  Procedure Laterality Date  . ABDOMINAL HYSTERECTOMY    . BLADDER REPAIR    . BREAST LUMPECTOMY Left   . BREAST SURGERY     left  . CAROTID ENDARTERECTOMY  2008   left  . COLONOSCOPY  2008  . EYE SURGERY Right 2013   cataract  . KIDNEY SURGERY  1949    Family History  Problem Relation Age of Onset  . Stroke Mother   . Heart disease Father   . Heart attack Maternal Grandfather     Social History:  reports that she has been smoking cigarettes.  She has  a 30.00 pack-year smoking history. she has never used smokeless tobacco. She reports that she does not drink alcohol or use drugs.  Allergies:  Allergies  Allergen Reactions  . Statins Other (See Comments)    Myalgias    Allergies as of 12/14/2017      Reactions   Statins Other (See Comments)   Myalgias      Medication List        Accurate as of 12/14/17  2:06 PM. Always use your most recent med list.          alendronate 70 MG tablet Commonly known as:  FOSAMAX Take 1 tablet (70 mg total) by mouth every 7 (seven) days. Take with a full glass of water on an empty stomach.   amLODipine 10 MG tablet Commonly known as:  NORVASC TAKE 1 TABLET BY MOUTH EVERY DAY   ezetimibe 10 MG tablet Commonly known as:  ZETIA Take 1 tablet (10 mg total) by mouth daily.   levothyroxine 175 MCG tablet Commonly known as:  SYNTHROID, LEVOTHROID Take 1 tablet (175 mcg total) by mouth daily.   lisinopril 5 MG tablet Commonly known as:  PRINIVIL,ZESTRIL TAKE 1 TABLET BY MOUTH EVERY DAY   metoprolol tartrate 25 MG tablet Commonly known as:  LOPRESSOR TAKE 1 TABLET BY MOUTH TWICE A DAY        Review of Systems  Constitutional: Positive for malaise. Negative for weight loss.       She has some easy fatigability but not able to do much activity because of her back deformity  HENT: Negative for trouble swallowing.   Respiratory: Negative for shortness of breath.   Cardiovascular: Negative for chest pain, palpitations and leg swelling.  Gastrointestinal: Negative for diarrhea and abdominal pain.  Endocrine: Negative for fatigue and light-headedness.       Hypothyroidism since about 1992, diagnosed after a stroke  Musculoskeletal: Positive for back pain.  Skin: Negative for rash.  Neurological: Negative for weakness.  Psychiatric/Behavioral: Negative for depressed mood.   She has had hypothyroidism since about 1992, she does not remember what symptoms she had initially and did not feel  any different with starting levothyroxine supplement She has been on about the same dosage of 175 g levothyroxine for several years and followed by PCP   Lab Results  Component Value Date   TSH 1.160 12/07/2017   TSH 2.610 12/09/2016   TSH 0.66 02/18/2016   FREET4 1.59 02/18/2016   FREET4 1.41 06/26/2012      PHYSICAL EXAM:  BP 118/64   Pulse (!) 55   Ht 5\' 5"  (1.651 m)   Wt 114 lb (51.7 kg)   BMI 18.97 kg/m   GENERAL: Averagely built appears asthenic   No pallor or peripheral edema.    Skin:  no rash or pigmentation.  EYES:  Externally normal.    ENT: Oral mucosa and tongue normal.  THYROID:  Not palpable. No lymphadenopathy in the neck  HEART:  Normal  S1 and S2; no murmur or click.  CHEST:  Lungs: Vescicular breath sounds heard equally.  No crepitations/ wheeze.  ABDOMEN:  No distention.  Liver and spleen not palpable.   No other mass or tenderness. Has only about 3 finger breadths between lower rib cage and pelvic crest  JOINTS/extremities:  Normal peripheral joints.  Mild fingernail clubbing present  SPINE: Mild dorsal kyphosis present Has severe kyphoscoliosis of the lumbar spine with loss of lordosis No tenderness of the spine  NEUROLOGICAL: .Reflexes are normal bilaterally at biceps.   ASSESSMENT:   OSTEOPOROSIS  with  T score -2.6 at the femur Likely the patient's osteoporosis is related to age and postmenopausal status as well as history of smoking  Currently patient is symptomatic and has no evidence of fracture documented on her spinal scans and no previous history of fractures She does not have any other factors making her at a high risk for fracture, this will dictate her treatment choice and availability of medications that can be covered by her insurance  She has been started on Fosamax by her PCP about a month ago which she is tolerating  SCOLIOSIS: This is severe and surgery has been recommended Her spinal surgeon has requested use  of Forteo for bone building prior to extensive scoliosis surgery    PLAN:    Although her neurosurgeon prefers using Forteo or Tymlos this is unlikely to be covered by her insurance and would be too expensive   Prolia may be a better option since this would be likely more effective than Fosamax and likely has quicker onset of action  Will get this prior authorized and start when available  Meanwhile will need to assess her vitamin D status as she is not taking any vitamin D supplement  She also needs to take at least 500 mg of calcium daily  If she is not able to get Prolia covered May try Actonel which potentially may be more effective in a short period of time compared to Fosamax; however not clear if this would be covered as well with her insurance and she is concerned about the cost  Strongly suggested that she needs to quit smoking because this is having an impact on her bone density.   Discussed various options for smoking cessation but she thinks that Chantix would be too expensive and she is allergic to nicotine patches.  Recommended that she try and use of OTC nicotine lozenges and also discuss enrolling in smoking cessation program with her PCP  Consultation note sent referring physician   Elayne Snare 12/14/2017, 2:06 PM

## 2017-12-25 ENCOUNTER — Encounter: Payer: Self-pay | Admitting: Family Medicine

## 2017-12-25 ENCOUNTER — Ambulatory Visit (INDEPENDENT_AMBULATORY_CARE_PROVIDER_SITE_OTHER): Payer: Medicare HMO | Admitting: Family Medicine

## 2017-12-25 VITALS — BP 106/70 | HR 73 | Temp 97.6°F | Wt 112.0 lb

## 2017-12-25 DIAGNOSIS — M810 Age-related osteoporosis without current pathological fracture: Secondary | ICD-10-CM | POA: Diagnosis not present

## 2017-12-25 DIAGNOSIS — E782 Mixed hyperlipidemia: Secondary | ICD-10-CM | POA: Diagnosis not present

## 2017-12-25 DIAGNOSIS — I10 Essential (primary) hypertension: Secondary | ICD-10-CM

## 2017-12-25 DIAGNOSIS — D510 Vitamin B12 deficiency anemia due to intrinsic factor deficiency: Secondary | ICD-10-CM | POA: Diagnosis not present

## 2017-12-25 DIAGNOSIS — E039 Hypothyroidism, unspecified: Secondary | ICD-10-CM | POA: Diagnosis not present

## 2017-12-25 DIAGNOSIS — M4126 Other idiopathic scoliosis, lumbar region: Secondary | ICD-10-CM | POA: Diagnosis not present

## 2017-12-25 DIAGNOSIS — I129 Hypertensive chronic kidney disease with stage 1 through stage 4 chronic kidney disease, or unspecified chronic kidney disease: Secondary | ICD-10-CM

## 2017-12-25 MED ORDER — METOPROLOL TARTRATE 25 MG PO TABS: 25.0000 mg | ORAL_TABLET | Freq: Two times a day (BID) | ORAL | 1 refills | Status: DC

## 2017-12-25 MED ORDER — LISINOPRIL 5 MG PO TABS: ORAL_TABLET | ORAL | 1 refills | Status: DC

## 2017-12-25 MED ORDER — LEVOTHYROXINE SODIUM 175 MCG PO TABS: 175.0000 ug | ORAL_TABLET | Freq: Every day | ORAL | 3 refills | Status: DC

## 2017-12-25 MED ORDER — AMLODIPINE BESYLATE 10 MG PO TABS: 10.0000 mg | ORAL_TABLET | Freq: Every day | ORAL | 1 refills | Status: DC

## 2017-12-25 NOTE — Assessment & Plan Note (Signed)
Continue to follow with spine specialist. Working on treating osteoporosis to consider surgery.

## 2017-12-25 NOTE — Assessment & Plan Note (Signed)
Rechecking levels today. Continue B12. Continue to monitor.

## 2017-12-25 NOTE — Assessment & Plan Note (Signed)
Cannot tolerated zetia or statins. Will recheck levels, if still elevated, will consider gebfibrozil or repatha pending cost. Call with any concerns.

## 2017-12-25 NOTE — Assessment & Plan Note (Signed)
On fosamax. Possibly being switched to Prolia. Continue to monitor.

## 2017-12-25 NOTE — Progress Notes (Signed)
BP 106/70 (BP Location: Left Arm, Patient Position: Sitting, Cuff Size: Normal)   Pulse 73   Temp 97.6 F (36.4 C)   Wt 112 lb (50.8 kg)   BMI 18.64 kg/m    Subjective:    Patient ID: Cathy Solis, female    DOB: 1941-02-25, 77 y.o.   MRN: [id number]  HPI: Cathy Solis is a 77 y.o. female  Chief Complaint  Patient presents with  . Hyperlipidemia    Patient states that the Zetia is causing leg cramps as well  . Hypertension  . Hypothyroidism   HYPERTENSION / HYPERLIPIDEMIA Satisfied with current treatment? yes Duration of hypertension: chronic BP monitoring frequency: not checking BP medication side effects: no Past BP meds: lisinopril, metoprolol, amlodipine Duration of hyperlipidemia: chronic Cholesterol medication side effects: yes Cholesterol supplements: none Past cholesterol medications: statins- myalgia, zetia- myalgia Medication compliance: good compliance Aspirin: no Recent stressors: no Recurrent headaches: no Visual changes: no Palpitations: no Dyspnea: no Chest pain: no Lower extremity edema: no Dizzy/lightheaded: no  HYPOTHYROIDISM Thyroid control status:controlled Satisfied with current treatment? no Medication side effects: no Medication compliance: excellent compliance Recent dose adjustment:no Fatigue: no Cold intolerance: no Heat intolerance: no Weight gain: no Weight loss: no Constipation: no Diarrhea/loose stools: no Palpitations: no Lower extremity edema: no Anxiety/depressed mood: no  ANEMIA Anemia status: controlled Etiology of anemia: Pernicious anemia Duration of anemia treatment: chronic Compliance with treatment: excellent compliance B12 supplementation side effects: no Severity of anemia: mild Fatigue: no Decreased exercise tolerance: no  Dyspnea on exertion: no Palpitations: no Bleeding: no Pica: no  Relevant past medical, surgical, family and social history reviewed and updated as indicated. Interim medical  history since our last visit reviewed. Allergies and medications reviewed and updated.  Review of Systems  Constitutional: Negative.   Respiratory: Negative.   Cardiovascular: Negative.   Musculoskeletal: Positive for arthralgias, back pain and myalgias. Negative for gait problem, joint swelling, neck pain and neck stiffness.  Psychiatric/Behavioral: Negative.     Per HPI unless specifically indicated above     Objective:    BP 106/70 (BP Location: Left Arm, Patient Position: Sitting, Cuff Size: Normal)   Pulse 73   Temp 97.6 F (36.4 C)   Wt 112 lb (50.8 kg)   BMI 18.64 kg/m   Wt Readings from Last 3 Encounters:  12/25/17 112 lb (50.8 kg)  12/14/17 114 lb (51.7 kg)  11/02/17 114 lb (51.7 kg)    Physical Exam  Constitutional: She is oriented to person, place, and time. She appears well-developed and well-nourished. No distress.  HENT:  Head: Normocephalic and atraumatic.  Right Ear: Hearing normal.  Left Ear: Hearing normal.  Nose: Nose normal.  Eyes: Conjunctivae and lids are normal. Right eye exhibits no discharge. Left eye exhibits no discharge. No scleral icterus.  Pulmonary/Chest: Effort normal. No respiratory distress.  Musculoskeletal: Normal range of motion.  Neurological: She is alert and oriented to person, place, and time.  Skin: Skin is intact. No rash noted.  Psychiatric: She has a normal mood and affect. Her speech is normal and behavior is normal. Judgment and thought content normal. Cognition and memory are normal.    Results for orders placed or performed in visit on 12/14/17  CBC with Differential/Platelet  Result Value Ref Range   WBC 9.4 4.0 - 10.5 K/uL   RBC 4.26 3.87 - 5.11 Mil/uL   Hemoglobin 13.4 12.0 - 15.0 g/dL   HCT 40.2 36.0 - 46.0 %   MCV 94.3  78.0 - 100.0 fl   MCHC 33.5 30.0 - 36.0 g/dL   RDW 13.5 11.5 - 15.5 %   Platelets 274.0 150.0 - 400.0 K/uL   Neutrophils Relative % 65.7 43.0 - 77.0 %   Lymphocytes Relative 20.4 12.0 - 46.0 %     Monocytes Relative 11.3 3.0 - 12.0 %   Eosinophils Relative 1.4 0.0 - 5.0 %   Basophils Relative 1.2 0.0 - 3.0 %   Neutro Abs 6.2 1.4 - 7.7 K/uL   Lymphs Abs 1.9 0.7 - 4.0 K/uL   Monocytes Absolute 1.1 (H) 0.1 - 1.0 K/uL   Eosinophils Absolute 0.1 0.0 - 0.7 K/uL   Basophils Absolute 0.1 0.0 - 0.1 K/uL  VITAMIN D 25 Hydroxy (Vit-D Deficiency, Fractures)  Result Value Ref Range   VITD 9.86 (L) 30.00 - 100.00 ng/mL      Assessment & Plan:   Problem List Items Addressed This Visit      Endocrine   Hypothyroidism    Under good control. Continue current regimen. Continue to monitor. Call with any concerns.       Relevant Medications   metoprolol tartrate (LOPRESSOR) 25 MG tablet   levothyroxine (SYNTHROID, LEVOTHROID) 175 MCG tablet   Other Relevant Orders   Comprehensive metabolic panel   TSH     Musculoskeletal and Integument   Idiopathic scoliosis    Continue to follow with spine specialist. Working on treating osteoporosis to consider surgery.      Osteoporosis    On fosamax. Possibly being switched to Prolia. Continue to monitor.       Relevant Medications   Vitamin D, Cholecalciferol, 400 units TABS     Genitourinary   Benign hypertensive renal disease    Under good control. Continue current regimen. Continue to monitor. Call with any concerns.       Relevant Medications   metoprolol tartrate (LOPRESSOR) 25 MG tablet   amLODipine (NORVASC) 10 MG tablet   Other Relevant Orders   Comprehensive metabolic panel     Other   Hyperlipidemia - Primary    Cannot tolerated zetia or statins. Will recheck levels, if still elevated, will consider gebfibrozil or repatha pending cost. Call with any concerns.      Relevant Medications   metoprolol tartrate (LOPRESSOR) 25 MG tablet   lisinopril (PRINIVIL,ZESTRIL) 5 MG tablet   amLODipine (NORVASC) 10 MG tablet   Other Relevant Orders   Comprehensive metabolic panel   Lipid Panel w/o Chol/HDL Ratio   Pernicious anemia     Rechecking levels today. Continue B12. Continue to monitor.       Relevant Orders   Comprehensive metabolic panel   CBC with Differential/Platelet   B12 and Folate Panel    Other Visit Diagnoses    Essential hypertension       Relevant Medications   metoprolol tartrate (LOPRESSOR) 25 MG tablet   lisinopril (PRINIVIL,ZESTRIL) 5 MG tablet   amLODipine (NORVASC) 10 MG tablet       Follow up plan: Return in about 6 months (around 06/24/2018) for Physical.

## 2017-12-25 NOTE — Assessment & Plan Note (Signed)
Under good control. Continue current regimen. Continue to monitor. Call with any concerns. 

## 2017-12-26 ENCOUNTER — Encounter: Payer: Self-pay | Admitting: Family Medicine

## 2017-12-26 LAB — COMPREHENSIVE METABOLIC PANEL
ALT: 12 IU/L (ref 0–32)
AST: 21 IU/L (ref 0–40)
Albumin/Globulin Ratio: 1.4 (ref 1.2–2.2)
Albumin: 4.2 g/dL (ref 3.5–4.8)
Alkaline Phosphatase: 77 IU/L (ref 39–117)
BUN/Creatinine Ratio: 20 (ref 12–28)
BUN: 25 mg/dL (ref 8–27)
Bilirubin Total: 0.2 mg/dL (ref 0.0–1.2)
CO2: 24 mmol/L (ref 20–29)
Calcium: 9.6 mg/dL (ref 8.7–10.3)
Chloride: 95 mmol/L — ABNORMAL LOW (ref 96–106)
Creatinine, Ser: 1.27 mg/dL — ABNORMAL HIGH (ref 0.57–1.00)
GFR calc Af Amer: 46 mL/min/{1.73_m2} — ABNORMAL LOW (ref >59)
GFR calc non Af Amer: 40 mL/min/{1.73_m2} — ABNORMAL LOW (ref >59)
Globulin, Total: 2.9 g/dL (ref 1.5–4.5)
Glucose: 147 mg/dL — ABNORMAL HIGH (ref 65–99)
Potassium: 4.3 mmol/L (ref 3.5–5.2)
Sodium: 136 mmol/L (ref 134–144)
Total Protein: 7.1 g/dL (ref 6.0–8.5)

## 2017-12-26 LAB — CBC WITH DIFFERENTIAL/PLATELET
Basophils Absolute: 0 10*3/uL (ref 0.0–0.2)
Basos: 0 %
EOS (ABSOLUTE): 0.1 10*3/uL (ref 0.0–0.4)
Eos: 2 %
Hematocrit: 43.8 % (ref 34.0–46.6)
Hemoglobin: 14.3 g/dL (ref 11.1–15.9)
Immature Grans (Abs): 0 10*3/uL (ref 0.0–0.1)
Immature Granulocytes: 0 %
Lymphocytes Absolute: 1.5 10*3/uL (ref 0.7–3.1)
Lymphs: 15 %
MCH: 31.2 pg (ref 26.6–33.0)
MCHC: 32.6 g/dL (ref 31.5–35.7)
MCV: 96 fL (ref 79–97)
Monocytes Absolute: 0.7 10*3/uL (ref 0.1–0.9)
Monocytes: 7 %
Neutrophils Absolute: 7.1 10*3/uL — ABNORMAL HIGH (ref 1.4–7.0)
Neutrophils: 76 %
Platelets: 288 10*3/uL (ref 150–379)
RBC: 4.58 x10E6/uL (ref 3.77–5.28)
RDW: 13.5 % (ref 12.3–15.4)
WBC: 9.4 10*3/uL (ref 3.4–10.8)

## 2017-12-26 LAB — LIPID PANEL W/O CHOL/HDL RATIO
Cholesterol, Total: 200 mg/dL — ABNORMAL HIGH (ref 100–199)
HDL: 85 mg/dL (ref >39)
LDL Calculated: 86 mg/dL (ref 0–99)
Triglycerides: 143 mg/dL (ref 0–149)
VLDL Cholesterol Cal: 29 mg/dL (ref 5–40)

## 2017-12-26 LAB — B12 AND FOLATE PANEL
Folate: 4.4 ng/mL (ref >3.0)
Vitamin B-12: 555 pg/mL (ref 232–1245)

## 2017-12-26 LAB — TSH: TSH: 1.96 u[IU]/mL (ref 0.450–4.500)

## 2018-03-14 ENCOUNTER — Ambulatory Visit: Payer: Medicare HMO | Admitting: Endocrinology

## 2018-03-27 DIAGNOSIS — Z0279 Encounter for issue of other medical certificate: Secondary | ICD-10-CM

## 2018-04-02 ENCOUNTER — Telehealth: Payer: Self-pay

## 2018-04-02 NOTE — Telephone Encounter (Signed)
Received fax from [employer] stating that patient has been approved for Prolia, a drug to strengthen bones.

## 2018-04-10 ENCOUNTER — Telehealth: Payer: Self-pay

## 2018-04-10 NOTE — Telephone Encounter (Signed)
Attempted to reach the patient to schedule her appointment for prolia injection- patient owes $255 at check in and is ready to be scheduled anytime

## 2018-04-11 ENCOUNTER — Other Ambulatory Visit: Payer: Self-pay

## 2018-04-11 ENCOUNTER — Encounter: Payer: Self-pay | Admitting: Family Medicine

## 2018-04-11 DIAGNOSIS — I6523 Occlusion and stenosis of bilateral carotid arteries: Secondary | ICD-10-CM

## 2018-04-26 ENCOUNTER — Telehealth: Payer: Self-pay

## 2018-04-26 NOTE — Telephone Encounter (Signed)
Patient scheduled for 05/15/18 for Prolia

## 2018-05-03 ENCOUNTER — Encounter (INDEPENDENT_AMBULATORY_CARE_PROVIDER_SITE_OTHER): Payer: Self-pay | Admitting: Vascular Surgery

## 2018-05-03 ENCOUNTER — Ambulatory Visit (INDEPENDENT_AMBULATORY_CARE_PROVIDER_SITE_OTHER): Payer: Medicare HMO | Admitting: Vascular Surgery

## 2018-05-03 VITALS — BP 100/62 | HR 56 | Resp 15 | Ht 61.0 in | Wt 109.0 lb

## 2018-05-03 DIAGNOSIS — I10 Essential (primary) hypertension: Secondary | ICD-10-CM | POA: Diagnosis not present

## 2018-05-03 DIAGNOSIS — E782 Mixed hyperlipidemia: Secondary | ICD-10-CM

## 2018-05-03 DIAGNOSIS — I6523 Occlusion and stenosis of bilateral carotid arteries: Secondary | ICD-10-CM | POA: Diagnosis not present

## 2018-05-03 DIAGNOSIS — I2510 Atherosclerotic heart disease of native coronary artery without angina pectoris: Secondary | ICD-10-CM | POA: Diagnosis not present

## 2018-05-04 ENCOUNTER — Encounter (INDEPENDENT_AMBULATORY_CARE_PROVIDER_SITE_OTHER): Payer: Self-pay | Admitting: Vascular Surgery

## 2018-05-04 DIAGNOSIS — I10 Essential (primary) hypertension: Secondary | ICD-10-CM | POA: Insufficient documentation

## 2018-05-04 NOTE — Progress Notes (Signed)
MRN : [id number]  Cathy Solis is a 77 y.o. (25-Mar-1941) female who presents with chief complaint of  Chief Complaint  Patient presents with  . New Patient (Initial Visit)    Carotid Stenosis  .  History of Present Illness: The patient is seen for evaluation of carotid stenosis. The carotid stenosis has been followed in the past by duplex ultrasound with Dr Jamal Collin.  The patient denies amaurosis fugax. There is no recent history of TIA symptoms or focal motor deficits. There is no prior documented CVA.  There is no history of migraine headaches. There is no history of seizures.  The patient is taking enteric-coated aspirin 81 mg daily.  The patient has a history of coronary artery disease, no recent episodes of angina or shortness of breath. The patient denies PAD or claudication symptoms. There is a history of hyperlipidemia which is being treated with a statin.    Current Meds  Medication Sig  . alendronate (FOSAMAX) 70 MG tablet Take 1 tablet (70 mg total) by mouth every 7 (seven) days. Take with a full glass of water on an empty stomach.  [name] amLODipine (NORVASC) 10 MG tablet Take 1 tablet (10 mg total) by mouth daily.  [name] levothyroxine (SYNTHROID, LEVOTHROID) 175 MCG tablet Take 1 tablet (175 mcg total) by mouth daily.  [name] lisinopril (PRINIVIL,ZESTRIL) 5 MG tablet TAKE 1 TABLET BY MOUTH EVERY DAY  . metoprolol tartrate (LOPRESSOR) 25 MG tablet Take 1 tablet (25 mg total) by mouth 2 (two) times daily.  . rosuvastatin (CRESTOR) 40 MG tablet Take 40 mg by mouth daily.  . Vitamin D, Cholecalciferol, 400 units TABS Take by mouth 2 (two) times daily.    Past Medical History:  Diagnosis Date  . Breast cancer [facility]) 2006   Left breast, s/p radiation  . 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 Hypertension   . Kidney problem    Undeveloped R kidney  . 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 Personal history of tobacco use, presenting hazards to  health 03/18/2016  . Stroke [facility])    residual left sided weakness  . Toe infection    followed by Dr. Jens Som    Past Surgical History:  Procedure Laterality Date  . ABDOMINAL HYSTERECTOMY    . BLADDER REPAIR    . BREAST LUMPECTOMY Left   . BREAST SURGERY     left  . CAROTID ENDARTERECTOMY  2008   left  . COLONOSCOPY  2008  . EYE SURGERY Right 2013   cataract  . KIDNEY SURGERY  1949    Social History Social History   Tobacco Use  . Smoking status: Current Every Day Smoker    Packs/day: 1.00    Years: 30.00    Pack years: 30.00    Types: Cigarettes  . Smokeless tobacco: Never Used  Substance Use Topics  . Alcohol use: No  . Drug use: No    Family History Family History  Problem Relation Age of Onset  . Stroke Mother   . Heart disease Father   . Heart attack Maternal Grandfather   No family history of bleeding/clotting disorders, porphyria or autoimmune disease   Allergies  Allergen Reactions  . Statins Other (See Comments)    Myalgias  . Zetia [Ezetimibe] Other (See Comments)    myalgias     REVIEW OF SYSTEMS (Negative unless checked)  Constitutional: [] Weight loss  [] Fever  [] Chills Cardiac: [] Chest pain   []   Chest pressure   [] Palpitations   [] Shortness of breath when laying flat   [] Shortness of breath with exertion. Vascular:  [] Pain in legs with walking   [] Pain in legs at rest  [] History of DVT   [] Phlebitis   [] Swelling in legs   [] Varicose veins   [] Non-healing ulcers Pulmonary:   [] Uses home oxygen   [] Productive cough   [] Hemoptysis   [] Wheeze  [x] COPD   [] Asthma Neurologic:  [] Dizziness   [] Seizures   [] History of stroke   [] History of TIA  [] Aphasia   [] Vissual changes   [] Weakness or numbness in arm   [] Weakness or numbness in leg Musculoskeletal:   [] Joint swelling   [] Joint pain   [] Low back pain Hematologic:  [] Easy bruising  [] Easy bleeding   [] Hypercoagulable state   [] Anemic Gastrointestinal:  [] Diarrhea   [] Vomiting  [] Gastroesophageal  reflux/heartburn   [] Difficulty swallowing. Genitourinary:  [] Chronic kidney disease   [] Difficult urination  [] Frequent urination   [] Blood in urine Skin:  [] Rashes   [] Ulcers  Psychological:  [] History of anxiety   []  History of major depression.  Physical Examination  Vitals:   05/03/18 1139  BP: 100/62  Pulse: (!) 56  Resp: 15  Weight: 109 lb (49.4 kg)  Height: 5\' 1"  (1.549 m)   Body mass index is 20.6 kg/m. Gen: WD/WN, NAD Head: [place]/AT, No temporalis wasting.  Ear/Nose/Throat: Hearing grossly intact, nares w/o erythema or drainage, poor dentition Eyes: PER, EOMI, sclera nonicteric.  Neck: Supple, no masses.  No bruit or JVD.  Pulmonary:  Good air movement, clear to auscultation bilaterally, no use of accessory muscles.  Cardiac: RRR, normal S1, S2, no Murmurs. Vascular: well healed left CEA incisional scar Vessel Right Left  Radial Palpable Palpable  Brachial Palpable Palpable  Carotid Palpable Palpable  PT Palpable Palpable  DP Palpable Palpable  Gastrointestinal: soft, non-distended. No guarding/no peritoneal signs.  Musculoskeletal: M/S 5/5 throughout.  No deformity or atrophy.  Neurologic: CN 2-12 intact. Pain and light touch intact in extremities.  Symmetrical.  Speech is fluent. Motor exam as listed above. Psychiatric: Judgment intact, Mood & affect appropriate for pt's clinical situation. Dermatologic: No rashes or ulcers noted.  No changes consistent with cellulitis. Lymph : No Cervical lymphadenopathy, no lichenification or skin changes of chronic lymphedema.  CBC Lab Results  Component Value Date   WBC 9.4 12/25/2017   HGB 14.3 12/25/2017   HCT 43.8 12/25/2017   MCV 96 12/25/2017   PLT 288 12/25/2017    BMET    Component Value Date/Time   NA 136 12/25/2017 0852   NA 134 (L) 01/03/2014 1133   K 4.3 12/25/2017 0852   K 3.6 01/03/2014 1133   CL 95 (L) 12/25/2017 0852   CL 97 (L) 01/03/2014 1133   CO2 24 12/25/2017 0852   CO2 30 01/03/2014 1133    GLUCOSE 147 (H) 12/25/2017 0852   GLUCOSE 98 02/18/2016 0957   GLUCOSE 112 (H) 01/03/2014 1133   BUN 25 12/25/2017 0852   BUN 15 01/03/2014 1133   CREATININE 1.27 (H) 12/25/2017 0852   CREATININE 0.93 01/03/2014 1133   CALCIUM 9.6 12/25/2017 0852   CALCIUM 10.2 (H) 01/03/2014 1133   GFRNONAA 40 (L) 12/25/2017 0852   GFRNONAA >60 01/03/2014 1133   GFRAA 46 (L) 12/25/2017 0852   GFRAA >60 01/03/2014 1133   CrCl cannot be calculated (Patient's most recent lab result is older than the maximum 21 days allowed.).  COAG No results found for: INR, PROTIME  Radiology No results found.    Assessment/Plan 1. Bilateral carotid artery stenosis Recommend:  Given the patient's asymptomatic subcritical stenosis no further invasive testing or surgery at this time.  Duplex ultrasound shows <30% RICA and widely patent LICA.  Continue antiplatelet therapy as prescribed Continue management of CAD, HTN and Hyperlipidemia Healthy heart diet,  encouraged exercise at least 4 times per week Follow up in 12 months with duplex ultrasound and physical exam   - VAS US CAROTID; Future  2. Atherosclerosis of native coronary artery of native heart without angina pectoris Continue cardiac and antihypertensive medications as already ordered and reviewed, no changes at this time.  Continue statin as ordered and reviewed, no changes at this time  Nitrates PRN for chest pain   3. Mixed hyperlipidemia Continue statin as ordered and reviewed, no changes at this time   4. Essential hypertension Continue antihypertensive medications as already ordered, these medications have been reviewed and there are no changes at this time.     Hortencia Pilar, MD  05/04/2018 4:07 PM

## 2018-05-15 ENCOUNTER — Ambulatory Visit: Payer: Medicare HMO

## 2018-05-15 DIAGNOSIS — M810 Age-related osteoporosis without current pathological fracture: Secondary | ICD-10-CM

## 2018-05-15 MED ORDER — DENOSUMAB 60 MG/ML ~~LOC~~ SOSY
60.0000 mg | PREFILLED_SYRINGE | Freq: Once | SUBCUTANEOUS | Status: AC
  Administered 2018-05-15: 60 mg via SUBCUTANEOUS

## 2018-05-15 NOTE — Progress Notes (Signed)
Per orders of Dr. Dwyane Dee injection of Prolia given today by N.Shabreka Coulon,LPN. Patient tolerated injection well.

## 2018-06-25 ENCOUNTER — Encounter: Payer: Medicare HMO | Admitting: Family Medicine

## 2018-06-25 ENCOUNTER — Ambulatory Visit (INDEPENDENT_AMBULATORY_CARE_PROVIDER_SITE_OTHER): Payer: Medicare HMO

## 2018-06-25 VITALS — BP 122/78 | HR 55 | Temp 98.9°F | Resp 16 | Ht 62.0 in | Wt 113.0 lb

## 2018-06-25 DIAGNOSIS — Z0000 Encounter for general adult medical examination without abnormal findings: Secondary | ICD-10-CM | POA: Diagnosis not present

## 2018-06-25 NOTE — Progress Notes (Signed)
Subjective:   Cathy Solis is a 77 y.o. female who presents for Medicare Annual (Subsequent) preventive examination.  Review of Systems:   Cardiac Risk Factors include: advanced age (>52men, >55 women);dyslipidemia;hypertension;obesity (BMI >30kg/m2);smoking/ tobacco exposure     Objective:     Vitals: BP 122/78 (BP Location: Left Arm, Patient Position: Sitting)   Pulse (!) 55   Temp 98.9 F (37.2 C) (Temporal)   Resp 16   Ht 5\' 2"  (1.575 m)   Wt 113 lb (51.3 kg)   BMI 20.67 kg/m   Body mass index is 20.67 kg/m.  Advanced Directives 06/25/2018 05/04/2017 06/30/2015  Does Patient Have a Medical Advance Directive? Yes Yes Yes  Type of Paramedic of [place];Living will [place];Living will [place]  Does patient want to make changes to medical advance directive? - - No - Patient declined  Copy of [place] in Chart? No - copy requested No - copy requested No - copy requested    Tobacco Social History   Tobacco Use  Smoking Status Current Every Day Smoker  . Packs/day: 1.00  . Years: 30.00  . Pack years: 30.00  . Types: Cigarettes  Smokeless Tobacco Never Used     Ready to quit: Yes Counseling given: Yes   Clinical Intake:  Pre-visit preparation completed: Yes  Pain : No/denies pain     Nutritional Status: BMI of 19-24  Normal Nutritional Risks: None Diabetes: No  How often do you need to have someone help you when you read instructions, pamphlets, or other written materials from your doctor or pharmacy?: 1 - Never What is the last grade level you completed in school?: 12th grade  Interpreter Needed?: No  Information entered by :: Emmalina Espericueta,LPN   Past Medical History:  Diagnosis Date  . Breast cancer [facility]) 2006   Left breast, s/p radiation  . 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 Hypertension   . Kidney problem    Undeveloped R kidney  . Occlusion and stenosis of carotid  artery without mention of cerebral infarction   . Personal history of tobacco use, presenting hazards to health 03/18/2016  . Stroke [facility])    residual left sided weakness  . Toe infection    followed by Dr. Jens Som   Past Surgical History:  Procedure Laterality Date  . ABDOMINAL HYSTERECTOMY    . BLADDER REPAIR    . BREAST LUMPECTOMY Left   . BREAST SURGERY     left  . CAROTID ENDARTERECTOMY  2008   left  . COLONOSCOPY  2008  . EYE SURGERY Right 2013   cataract  . KIDNEY SURGERY  1949   Family History  Problem Relation Age of Onset  . Stroke Mother   . Heart disease Father   . Heart attack Maternal Grandfather    Social History   Socioeconomic History  . Marital status: Widowed    Spouse name: Not on file  . Number of children: Not on file  . Years of education: Not on file  . Highest education level: High school graduate  Occupational History  . Not on file  Social Needs  . Financial resource strain: Not hard at all  . Food insecurity:    Worry: Never true    Inability: Never true  . Transportation needs:    Medical: No    Non-medical: No  Tobacco Use  . Smoking status: Current Every Day Smoker  Packs/day: 1.00    Years: 30.00    Pack years: 30.00    Types: Cigarettes  . Smokeless tobacco: Never Used  Substance and Sexual Activity  . Alcohol use: No  . Drug use: No  . Sexual activity: Never  Lifestyle  . Physical activity:    Days per week: 0 days    Minutes per session: 0 min  . Stress: Not at all  Relationships  . Social connections:    Talks on phone: More than three times a week    Gets together: More than three times a week    Attends religious service: More than 4 times per year    Active member of club or organization: No    Attends meetings of clubs or organizations: Never    Relationship status: Widowed  Other Topics Concern  . Not on file  Social History Narrative  . Not on file    Outpatient Encounter Medications as of 06/25/2018    Medication Sig  . alendronate (FOSAMAX) 70 MG tablet Take 1 tablet (70 mg total) by mouth every 7 (seven) days. Take with a full glass of water on an empty stomach.  [name] amLODipine (NORVASC) 10 MG tablet Take 1 tablet (10 mg total) by mouth daily.  [name] levothyroxine (SYNTHROID, LEVOTHROID) 175 MCG tablet Take 1 tablet (175 mcg total) by mouth daily.  [name] lisinopril (PRINIVIL,ZESTRIL) 5 MG tablet TAKE 1 TABLET BY MOUTH EVERY DAY  . metoprolol tartrate (LOPRESSOR) 25 MG tablet Take 1 tablet (25 mg total) by mouth 2 (two) times daily.  . rosuvastatin (CRESTOR) 40 MG tablet Take 40 mg by mouth daily.  . Vitamin D, Cholecalciferol, 400 units TABS Take by mouth 2 (two) times daily.   No facility-administered encounter medications on file as of 06/25/2018.     Activities of Daily Living In your present state of health, do you have any difficulty performing the following activities: 06/25/2018  Hearing? N  Vision? N  Difficulty concentrating or making decisions? Y  Walking or climbing stairs? N  Dressing or bathing? N  Doing errands, shopping? N  Preparing Food and eating ? N  Using the Toilet? N  In the past six months, have you accidently leaked urine? Y  Comment wears protection   Do you have problems with loss of bowel control? N  Managing your Medications? N  Managing your Finances? N  Housekeeping or managing your Housekeeping? N  Some recent data might be hidden    Patient Care Team: Valerie Roys, DO as PCP - General (Family Medicine) Christene Lye, MD (General Surgery)    Assessment:   This is a routine wellness examination for [employer].  Exercise Activities and Dietary recommendations Current Exercise Habits: The patient does not participate in regular exercise at present, Exercise limited by: None identified  Goals    . Quit Smoking     Smoking cessation discussed    . Quit smoking / using tobacco      Decrease the amount of cigarettes smoked per day. Currently  smokes 20 per day.  Decrease to 15, then to 10, then to 5, then to 2, then to 1 per day. Then to quit.       Fall Risk Fall Risk  06/25/2018 05/04/2017 12/09/2016 05/23/2016 06/30/2015  Falls in the past year? No No No No No   Is the patient's home free of loose throw rugs in walkways, pet beds, electrical cords, etc?   yes  Grab bars in the bathroom? yes      Handrails on the stairs?   no stairs       Adequate lighting?   yes  Timed Get Up and Go performed: Completed in 8 seconds with no use of assistive devices, steady gait. No intervention needed at this time.   Depression Screen PHQ 2/9 Scores 06/25/2018 05/04/2017 12/09/2016 05/23/2016  PHQ - 2 Score 0 0 0 0     Cognitive Function MMSE - Mini Mental State Exam 06/30/2015  Orientation to time 5  Orientation to Place 5  Registration 3  Attention/ Calculation 5  Recall 3  Language- name 2 objects 2  Language- repeat 1  Language- follow 3 step command 3  Language- read & follow direction 1  Write a sentence 1  Copy design 1  Total score 30     6CIT Screen 06/25/2018 05/04/2017  What Year? 0 points 0 points  What month? 0 points 0 points  What time? 0 points 0 points  Count back from 20 0 points 0 points  Months in reverse 0 points 0 points  Repeat phrase 0 points 0 points  Total Score 0 0    Immunization History  Administered Date(s) Administered  . Influenza Split 08/05/2013  . Influenza-Unspecified 08/11/2015, 09/02/2016, 08/14/2017  . Pneumococcal Conjugate-13 11/28/2014  . Pneumococcal Polysaccharide-23 06/26/2012  . Tdap 06/26/2012    Qualifies for Shingles Vaccine? No   Screening Tests Health Maintenance  Topic Date Due  . INFLUENZA VACCINE  06/21/2018  . TETANUS/TDAP  06/26/2022  . DEXA SCAN  Completed  . PNA vac Low Risk Adult  Completed    Cancer Screenings: Lung: Low Dose CT Chest recommended if Age 24-80 years, 30 pack-year currently smoking OR have quit w/in 15years. Patient does not qualify.   Breast:  Up to date on Mammogram? Yes  No longer required  Up to date of Bone Density/Dexa? Yes completed 09/13/2017 Colorectal: no longer required  Additional Screenings:  Hepatitis C Screening:  Not indicated      Plan:    I have personally reviewed and addressed the Medicare Annual Wellness questionnaire and have noted the following in the patient's chart:  A. Medical and social history B. Use of alcohol, tobacco or illicit drugs  C. Current medications and supplements D. Functional ability and status E.  Nutritional status F.  Physical activity G. Advance directives H. List of other physicians I.  Hospitalizations, surgeries, and ER visits in previous 12 months J.  [place] such as hearing and vision if needed, cognitive and depression L. Referrals and appointments   In addition, I have reviewed and discussed with patient certain preventive protocols, quality metrics, and best practice recommendations. A written personalized care plan for preventive services as well as general preventive health recommendations were provided to patient.   Signed,  Tyler Aas, LPN Nurse Health Advisor   Nurse Notes:none

## 2018-06-25 NOTE — Patient Instructions (Signed)
Cathy Solis , Thank you for taking time to come for your Medicare Wellness Visit. I appreciate your ongoing commitment to your health goals. Please review the following plan we discussed and let me know if I can assist you in the future.   Screening recommendations/referrals: Colonoscopy: no longer required Mammogram: no longer required Bone Density: no longer required Recommended yearly ophthalmology/optometry visit for glaucoma screening and checkup Recommended yearly dental visit for hygiene and checkup  Vaccinations: Influenza vaccine: due 07/2018 Pneumococcal vaccine: completed series Tdap vaccine: up to date  Shingles vaccine: not eligible   Advanced directives: Please bring a copy of your health care power of attorney and living will to the office at your convenience.  Conditions/risks identified: Smoking cessation discussed  Next appointment: Follow up in one year for your annual wellness exam.    Preventive Care 65 Years and Older, Female Preventive care refers to lifestyle choices and visits with your health care provider that can promote health and wellness. What does preventive care include?  A yearly physical exam. This is also called an annual well check.  Dental exams once or twice a year.  Routine eye exams. Ask your health care provider how often you should have your eyes checked.  Personal lifestyle choices, including:  Daily care of your teeth and gums.  Regular physical activity.  Eating a healthy diet.  Avoiding tobacco and drug use.  Limiting alcohol use.  Practicing safe sex.  Taking low-dose aspirin every day.  Taking vitamin and mineral supplements as recommended by your health care provider. What happens during an annual well check? The services and screenings done by your health care provider during your annual well check will depend on your age, overall health, lifestyle risk factors, and family history of disease. Counseling  Your health  care provider may ask you questions about your:  Alcohol use.  Tobacco use.  Drug use.  Emotional well-being.  Home and relationship well-being.  Sexual activity.  Eating habits.  History of falls.  Memory and ability to understand (cognition).  Work and work Statistician.  Reproductive health. Screening  You may have the following tests or measurements:  Height, weight, and BMI.  Blood pressure.  Lipid and cholesterol levels. These may be checked every 5 years, or more frequently if you are over 36 years old.  Skin check.  Lung cancer screening. You may have this screening every year starting at age 58 if you have a 30-pack-year history of smoking and currently smoke or have quit within the past 15 years.  Fecal occult blood test (FOBT) of the stool. You may have this test every year starting at age 79.  Flexible sigmoidoscopy or colonoscopy. You may have a sigmoidoscopy every 5 years or a colonoscopy every 10 years starting at age 79.  Hepatitis C blood test.  Hepatitis B blood test.  Sexually transmitted disease (STD) testing.  Diabetes screening. This is done by checking your blood sugar (glucose) after you have not eaten for a while (fasting). You may have this done every 1-3 years.  Bone density scan. This is done to screen for osteoporosis. You may have this done starting at age 79.  Mammogram. This may be done every 1-2 years. Talk to your health care provider about how often you should have regular mammograms. Talk with your health care provider about your test results, treatment options, and if necessary, the need for more tests. Vaccines  Your health care provider may recommend certain vaccines, such as:  Influenza vaccine. This is recommended every year.  Tetanus, diphtheria, and acellular pertussis (Tdap, Td) vaccine. You may need a Td booster every 10 years.  Zoster vaccine. You may need this after age 79.  Pneumococcal 13-valent conjugate  (PCV13) vaccine. One dose is recommended after age 79.  Pneumococcal polysaccharide (PPSV23) vaccine. One dose is recommended after age 79. Talk to your health care provider about which screenings and vaccines you need and how often you need them. This information is not intended to replace advice given to you by your health care provider. Make sure you discuss any questions you have with your health care provider. Document Released: 12/04/2015 Document Revised: 07/27/2016 Document Reviewed: 09/08/2015 Elsevier Interactive Patient Education  2017 [employer] Prevention in the Home Falls can cause injuries. They can happen to people of all ages. There are many things you can do to make your home safe and to help prevent falls. What can I do on the outside of my home?  Regularly fix the edges of walkways and driveways and fix any cracks.  Remove anything that might make you trip as you walk through a door, such as a raised step or threshold.  Trim any bushes or trees on the path to your home.  Use bright outdoor lighting.  Clear any walking paths of anything that might make someone trip, such as rocks or tools.  Regularly check to see if handrails are loose or broken. Make sure that both sides of any steps have handrails.  Any raised decks and porches should have guardrails on the edges.  Have any leaves, snow, or ice cleared regularly.  Use sand or salt on walking paths during winter.  Clean up any spills in your garage right away. This includes oil or grease spills. What can I do in the bathroom?  Use night lights.  Install grab bars by the toilet and in the tub and shower. Do not use towel bars as grab bars.  Use non-skid mats or decals in the tub or shower.  If you need to sit down in the shower, use a plastic, non-slip stool.  Keep the floor dry. Clean up any water that spills on the floor as soon as it happens.  Remove soap buildup in the tub or shower  regularly.  Attach bath mats securely with double-sided non-slip rug tape.  Do not have throw rugs and other things on the floor that can make you trip. What can I do in the bedroom?  Use night lights.  Make sure that you have a light by your bed that is easy to reach.  Do not use any sheets or blankets that are too big for your bed. They should not hang down onto the floor.  Have a firm chair that has side arms. You can use this for support while you get dressed.  Do not have throw rugs and other things on the floor that can make you trip. What can I do in the kitchen?  Clean up any spills right away.  Avoid walking on wet floors.  Keep items that you use a lot in easy-to-reach places.  If you need to reach something above you, use a strong step stool that has a grab bar.  Keep electrical cords out of the way.  Do not use floor polish or wax that makes floors slippery. If you must use wax, use non-skid floor wax.  Do not have throw rugs and other things on the floor that  can make you trip. What can I do with my stairs?  Do not leave any items on the stairs.  Make sure that there are handrails on both sides of the stairs and use them. Fix handrails that are broken or loose. Make sure that handrails are as long as the stairways.  Check any carpeting to make sure that it is firmly attached to the stairs. Fix any carpet that is loose or worn.  Avoid having throw rugs at the top or bottom of the stairs. If you do have throw rugs, attach them to the floor with carpet tape.  Make sure that you have a light switch at the top of the stairs and the bottom of the stairs. If you do not have them, ask someone to add them for you. What else can I do to help prevent falls?  Wear shoes that:  Do not have high heels.  Have rubber bottoms.  Are comfortable and fit you well.  Are closed at the toe. Do not wear sandals.  If you use a stepladder:  Make sure that it is fully  opened. Do not climb a closed stepladder.  Make sure that both sides of the stepladder are locked into place.  Ask someone to hold it for you, if possible.  Clearly mark and make sure that you can see:  Any grab bars or handrails.  First and last steps.  Where the edge of each step is.  Use tools that help you move around (mobility aids) if they are needed. These include:  Canes.  Walkers.  Scooters.  Crutches.  Turn on the lights when you go into a dark area. Replace any light bulbs as soon as they burn out.  Set up your furniture so you have a clear path. Avoid moving your furniture around.  If any of your floors are uneven, fix them.  If there are any pets around you, be aware of where they are.  Review your medicines with your doctor. Some medicines can make you feel dizzy. This can increase your chance of falling. Ask your doctor what other things that you can do to help prevent falls. This information is not intended to replace advice given to you by your health care provider. Make sure you discuss any questions you have with your health care provider. Document Released: 09/03/2009 Document Revised: 04/14/2016 Document Reviewed: 12/12/2014 Elsevier Interactive Patient Education  2017 [employer].   Steps to Quit Smoking Smoking tobacco can be bad for your health. It can also affect almost every organ in your body. Smoking puts you and people around you at risk for many serious long-lasting (chronic) diseases. Quitting smoking is hard, but it is one of the best things that you can do for your health. It is never too late to quit. What are the benefits of quitting smoking? When you quit smoking, you lower your risk for getting serious diseases and conditions. They can include:  Lung cancer or lung disease.  Heart disease.  Stroke.  Heart attack.  Not being able to have children (infertility).  Weak bones (osteoporosis) and broken bones (fractures).  If  you have coughing, wheezing, and shortness of breath, those symptoms may get better when you quit. You may also get sick less often. If you are pregnant, quitting smoking can help to lower your chances of having a baby of low birth weight. What can I do to help me quit smoking? Talk with your doctor about what can help you  quit smoking. Some things you can do (strategies) include:  Quitting smoking totally, instead of slowly cutting back how much you smoke over a period of time.  Going to in-person counseling. You are more likely to quit if you go to many counseling sessions.  Using resources and support systems, such as: ? Database administrator with a Social worker. ? Phone quitlines. ? Careers information officer. ? Support groups or group counseling. ? Text messaging programs. ? Mobile phone apps or applications.  Taking medicines. Some of these medicines may have nicotine in them. If you are pregnant or breastfeeding, do not take any medicines to quit smoking unless your doctor says it is okay. Talk with your doctor about counseling or other things that can help you.  Talk with your doctor about using more than one strategy at the same time, such as taking medicines while you are also going to in-person counseling. This can help make quitting easier. What things can I do to make it easier to quit? Quitting smoking might feel very hard at first, but there is a lot that you can do to make it easier. Take these steps:  Talk to your family and friends. Ask them to support and encourage you.  Call phone quitlines, reach out to support groups, or work with a Social worker.  Ask people who smoke to not smoke around you.  Avoid places that make you want (trigger) to smoke, such as: ? Bars. ? Parties. ? Smoke-break areas at work.  Spend time with people who do not smoke.  Lower the stress in your life. Stress can make you want to smoke. Try these things to help your stress: ? Getting regular  exercise. ? Deep-breathing exercises. ? Yoga. ? Meditating. ? Doing a body scan. To do this, close your eyes, focus on one area of your body at a time from head to toe, and notice which parts of your body are tense. Try to relax the muscles in those areas.  Download or buy apps on your mobile phone or tablet that can help you stick to your quit plan. There are many free apps, such as QuitGuide from the [employer] Office manager for Disease Control and Prevention). You can find more support from smokefree.gov and other websites.  This information is not intended to replace advice given to you by your health care provider. Make sure you discuss any questions you have with your health care provider. Document Released: 09/03/2009 Document Revised: 07/05/2016 Document Reviewed: 03/24/2015 Elsevier Interactive Patient Education  2018 [employer].

## 2018-06-29 ENCOUNTER — Ambulatory Visit (INDEPENDENT_AMBULATORY_CARE_PROVIDER_SITE_OTHER): Payer: Medicare HMO | Admitting: Family Medicine

## 2018-06-29 ENCOUNTER — Encounter: Payer: Self-pay | Admitting: Family Medicine

## 2018-06-29 VITALS — BP 122/62 | HR 49 | Temp 97.4°F | Ht 62.0 in | Wt 110.2 lb

## 2018-06-29 DIAGNOSIS — E782 Mixed hyperlipidemia: Secondary | ICD-10-CM | POA: Diagnosis not present

## 2018-06-29 DIAGNOSIS — E039 Hypothyroidism, unspecified: Secondary | ICD-10-CM

## 2018-06-29 DIAGNOSIS — M810 Age-related osteoporosis without current pathological fracture: Secondary | ICD-10-CM | POA: Diagnosis not present

## 2018-06-29 DIAGNOSIS — I129 Hypertensive chronic kidney disease with stage 1 through stage 4 chronic kidney disease, or unspecified chronic kidney disease: Secondary | ICD-10-CM

## 2018-06-29 DIAGNOSIS — Z720 Tobacco use: Secondary | ICD-10-CM

## 2018-06-29 DIAGNOSIS — I2510 Atherosclerotic heart disease of native coronary artery without angina pectoris: Secondary | ICD-10-CM | POA: Diagnosis not present

## 2018-06-29 DIAGNOSIS — Z0000 Encounter for general adult medical examination without abnormal findings: Secondary | ICD-10-CM

## 2018-06-29 DIAGNOSIS — I10 Essential (primary) hypertension: Secondary | ICD-10-CM | POA: Diagnosis not present

## 2018-06-29 DIAGNOSIS — D510 Vitamin B12 deficiency anemia due to intrinsic factor deficiency: Secondary | ICD-10-CM

## 2018-06-29 MED ORDER — LISINOPRIL 5 MG PO TABS: ORAL_TABLET | ORAL | 1 refills | Status: DC

## 2018-06-29 MED ORDER — AMLODIPINE BESYLATE 10 MG PO TABS: 10.0000 mg | ORAL_TABLET | Freq: Every day | ORAL | 1 refills | Status: DC

## 2018-06-29 MED ORDER — METOPROLOL TARTRATE 25 MG PO TABS: 25.0000 mg | ORAL_TABLET | Freq: Two times a day (BID) | ORAL | 1 refills | Status: DC

## 2018-06-29 NOTE — Patient Instructions (Signed)
Health Maintenance for Postmenopausal Women Menopause is a normal process in which your reproductive ability comes to an end. This process happens gradually over a span of months to years, usually between the ages of 22 and 9. Menopause is complete when you have missed 12 consecutive menstrual periods. It is important to talk with your health care provider about some of the most common conditions that affect postmenopausal women, such as heart disease, cancer, and bone loss (osteoporosis). Adopting a healthy lifestyle and getting preventive care can help to promote your health and wellness. Those actions can also lower your chances of developing some of these common conditions. What should I know about menopause? During menopause, you may experience a number of symptoms, such as:  Moderate-to-severe hot flashes.  Night sweats.  Decrease in sex drive.  Mood swings.  Headaches.  Tiredness.  Irritability.  Memory problems.  Insomnia.  Choosing to treat or not to treat menopausal changes is an individual decision that you make with your health care provider. What should I know about hormone replacement therapy and supplements? Hormone therapy products are effective for treating symptoms that are associated with menopause, such as hot flashes and night sweats. Hormone replacement carries certain risks, especially as you become older. If you are thinking about using estrogen or estrogen with progestin treatments, discuss the benefits and risks with your health care provider. What should I know about heart disease and stroke? Heart disease, heart attack, and stroke become more likely as you age. This may be due, in part, to the hormonal changes that your body experiences during menopause. These can affect how your body processes dietary fats, triglycerides, and cholesterol. Heart attack and stroke are both medical emergencies. There are many things that you can do to help prevent heart disease  and stroke:  Have your blood pressure checked at least every 1-2 years. High blood pressure causes heart disease and increases the risk of stroke.  If you are 53-22 years old, ask your health care provider if you should take aspirin to prevent a heart attack or a stroke.  Do not use any tobacco products, including cigarettes, chewing tobacco, or electronic cigarettes. If you need help quitting, ask your health care provider.  It is important to eat a healthy diet and maintain a healthy weight. ? Be sure to include plenty of vegetables, fruits, low-fat dairy products, and lean protein. ? Avoid eating foods that are high in solid fats, added sugars, or salt (sodium).  Get regular exercise. This is one of the most important things that you can do for your health. ? Try to exercise for at least 150 minutes each week. The type of exercise that you do should increase your heart rate and make you sweat. This is known as moderate-intensity exercise. ? Try to do strengthening exercises at least twice each week. Do these in addition to the moderate-intensity exercise.  Know your numbers.Ask your health care provider to check your cholesterol and your blood glucose. Continue to have your blood tested as directed by your health care provider.  What should I know about cancer screening? There are several types of cancer. Take the following steps to reduce your risk and to catch any cancer development as early as possible. Breast Cancer  Practice breast self-awareness. ? This means understanding how your breasts normally appear and feel. ? It also means doing regular breast self-exams. Let your health care provider know about any changes, no matter how small.  If you are 40  or older, have a clinician do a breast exam (clinical breast exam or CBE) every year. Depending on your age, family history, and medical history, it may be recommended that you also have a yearly breast X-ray (mammogram).  If you  have a family history of breast cancer, talk with your health care provider about genetic screening.  If you are at high risk for breast cancer, talk with your health care provider about having an MRI and a mammogram every year.  Breast cancer (BRCA) gene test is recommended for women who have family members with BRCA-related cancers. Results of the assessment will determine the need for genetic counseling and BRCA1 and for BRCA2 testing. BRCA-related cancers include these types: ? Breast. This occurs in males or females. ? Ovarian. ? Tubal. This may also be called fallopian tube cancer. ? Cancer of the abdominal or pelvic lining (peritoneal cancer). ? Prostate. ? Pancreatic.  Cervical, Uterine, and Ovarian Cancer Your health care provider may recommend that you be screened regularly for cancer of the pelvic organs. These include your ovaries, uterus, and vagina. This screening involves a pelvic exam, which includes checking for microscopic changes to the surface of your cervix (Pap test).  For women ages 21-65, health care providers may recommend a pelvic exam and a Pap test every three years. For women ages 79-65, they may recommend the Pap test and pelvic exam, combined with testing for human papilloma virus (HPV), every five years. Some types of HPV increase your risk of cervical cancer. Testing for HPV may also be done on women of any age who have unclear Pap test results.  Other health care providers may not recommend any screening for nonpregnant women who are considered low risk for pelvic cancer and have no symptoms. Ask your health care provider if a screening pelvic exam is right for you.  If you have had past treatment for cervical cancer or a condition that could lead to cancer, you need Pap tests and screening for cancer for at least 20 years after your treatment. If Pap tests have been discontinued for you, your risk factors (such as having a new sexual partner) need to be  reassessed to determine if you should start having screenings again. Some women have medical problems that increase the chance of getting cervical cancer. In these cases, your health care provider may recommend that you have screening and Pap tests more often.  If you have a family history of uterine cancer or ovarian cancer, talk with your health care provider about genetic screening.  If you have vaginal bleeding after reaching menopause, tell your health care provider.  There are currently no reliable tests available to screen for ovarian cancer.  Lung Cancer Lung cancer screening is recommended for adults 69-62 years old who are at high risk for lung cancer because of a history of smoking. A yearly low-dose CT scan of the lungs is recommended if you:  Currently smoke.  Have a history of at least 30 pack-years of smoking and you currently smoke or have quit within the past 15 years. A pack-year is smoking an average of one pack of cigarettes per day for one year.  Yearly screening should:  Continue until it has been 15 years since you quit.  Stop if you develop a health problem that would prevent you from having lung cancer treatment.  Colorectal Cancer  This type of cancer can be detected and can often be prevented.  Routine colorectal cancer screening usually begins at  age 42 and continues through age 45.  If you have risk factors for colon cancer, your health care provider may recommend that you be screened at an earlier age.  If you have a family history of colorectal cancer, talk with your health care provider about genetic screening.  Your health care provider may also recommend using home test kits to check for hidden blood in your stool.  A small camera at the end of a tube can be used to examine your colon directly (sigmoidoscopy or colonoscopy). This is done to check for the earliest forms of colorectal cancer.  Direct examination of the colon should be repeated every  5-10 years until age 71. However, if early forms of precancerous polyps or small growths are found or if you have a family history or genetic risk for colorectal cancer, you may need to be screened more often.  Skin Cancer  Check your skin from head to toe regularly.  Monitor any moles. Be sure to tell your health care provider: ? About any new moles or changes in moles, especially if there is a change in a mole's shape or color. ? If you have a mole that is larger than the size of a pencil eraser.  If any of your family members has a history of skin cancer, especially at a young age, talk with your health care provider about genetic screening.  Always use sunscreen. Apply sunscreen liberally and repeatedly throughout the day.  Whenever you are outside, protect yourself by wearing long sleeves, pants, a wide-brimmed hat, and sunglasses.  What should I know about osteoporosis? Osteoporosis is a condition in which bone destruction happens more quickly than new bone creation. After menopause, you may be at an increased risk for osteoporosis. To help prevent osteoporosis or the bone fractures that can happen because of osteoporosis, the following is recommended:  If you are 46-71 years old, get at least 1,000 mg of calcium and at least 600 mg of vitamin D per day.  If you are older than age 55 but younger than age 65, get at least 1,200 mg of calcium and at least 600 mg of vitamin D per day.  If you are older than age 54, get at least 1,200 mg of calcium and at least 800 mg of vitamin D per day.  Smoking and excessive alcohol intake increase the risk of osteoporosis. Eat foods that are rich in calcium and vitamin D, and do weight-bearing exercises several times each week as directed by your health care provider. What should I know about how menopause affects my mental health? Depression may occur at any age, but it is more common as you become older. Common symptoms of depression  include:  Low or sad mood.  Changes in sleep patterns.  Changes in appetite or eating patterns.  Feeling an overall lack of motivation or enjoyment of activities that you previously enjoyed.  Frequent crying spells.  Talk with your health care provider if you think that you are experiencing depression. What should I know about immunizations? It is important that you get and maintain your immunizations. These include:  Tetanus, diphtheria, and pertussis (Tdap) booster vaccine.  Influenza every year before the flu season begins.  Pneumonia vaccine.  Shingles vaccine.  Your health care provider may also recommend other immunizations. This information is not intended to replace advice given to you by your health care provider. Make sure you discuss any questions you have with your health care provider. Document Released: 12/30/2005  Document Revised: 05/27/2016 Document Reviewed: 08/11/2015 Elsevier Interactive Patient Education  2018 [employer]  

## 2018-06-29 NOTE — Progress Notes (Signed)
BP 122/62 (BP Location: Left Arm, Cuff Size: Normal)   Pulse (!) 49   Temp (!) 97.4 F (36.3 C)   Ht 5\' 2"  (1.575 m)   Wt 110 lb 4 oz (50 kg)   SpO2 97%   BMI 20.16 kg/m    Subjective:    Patient ID: Cathy Solis, female    DOB: 1941/05/16, 77 y.o.   MRN: [id number]  HPI: Cathy Solis is a 77 y.o. female presenting on 06/29/2018 for comprehensive medical examination. Current medical complaints include:  HYPERTENSION / HYPERLIPIDEMIA Satisfied with current treatment? yes Duration of hypertension: chronic BP monitoring frequency: not checking  BP medication side effects: no Past BP meds: amlodipine, lisinopril, metoprolol Duration of hyperlipidemia: chronic Cholesterol medication side effects: yes Cholesterol supplements: none Past cholesterol medications: none, lovastatin (mevacor), pravastatin (pravachol), rosuvastatin (crestor), simvastatin (zocor) and ezetimide (zetia) Medication compliance: excellent compliance Aspirin: yes Recent stressors: no Recurrent headaches: no Visual changes: no Palpitations: no Dyspnea: no Chest pain: no Lower extremity edema: no Dizzy/lightheaded: no  HYPOTHYROIDISM Thyroid control status:controlled Satisfied with current treatment? yes Medication side effects: no Medication compliance: excellent compliance Recent dose adjustment:no Fatigue: no Cold intolerance: no Heat intolerance: no Weight gain: no Weight loss: no Constipation: no Diarrhea/loose stools: no Palpitations: no Lower extremity edema: no Anxiety/depressed mood: no  ANEMIA Anemia status: controlled Compliance with treatment: excellent compliance Iron supplementation side effects: Not on anything Severity of anemia: mild Fatigue: no Decreased exercise tolerance: no  Dyspnea on exertion: no Palpitations: no Bleeding: no Pica: no  Menopausal Symptoms: no  Depression Screen done today and results listed below:  Depression screen [facility] 2/9 06/25/2018 05/04/2017  12/09/2016 05/23/2016 06/30/2015  Decreased Interest 0 0 0 0 0  Down, Depressed, Hopeless 0 0 0 0 0  PHQ - 2 Score 0 0 0 0 0    Past Medical History:  Past Medical History:  Diagnosis Date  . Breast cancer [facility]) 2006   Left breast, s/p radiation  . 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 Hypertension   . Kidney problem    Undeveloped R kidney  . 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 Personal history of tobacco use, presenting hazards to health 03/18/2016  . Stroke [facility])    residual left sided weakness  . Toe infection    followed by Dr. Jens Som    Surgical History:  Past Surgical History:  Procedure Laterality Date  . ABDOMINAL HYSTERECTOMY    . BLADDER REPAIR    . BREAST LUMPECTOMY Left   . BREAST SURGERY     left  . CAROTID ENDARTERECTOMY  2008   left  . COLONOSCOPY  2008  . EYE SURGERY Right 2013   cataract  . KIDNEY SURGERY  1949    Medications:  Current Outpatient Medications on File Prior to Visit  Medication Sig  . denosumab (PROLIA) 60 MG/ML SOSY injection Inject 60 mg into the skin once for 1 dose.  . levothyroxine (SYNTHROID, LEVOTHROID) 175 MCG tablet Take 1 tablet (175 mcg total) by mouth daily.  . Vitamin D, Cholecalciferol, 400 units TABS Take by mouth 2 (two) times daily.   No current facility-administered medications on file prior to visit.     Allergies:  Allergies  Allergen Reactions  . Statins Other (See Comments)    Myalgias  . Zetia [Ezetimibe] Other (See Comments)    myalgias    Social History:  Social History   Socioeconomic History  .  Marital status: Widowed    Spouse name: Not on file  . Number of children: Not on file  . Years of education: Not on file  . Highest education level: High school graduate  Occupational History  . Not on file  Social Needs  . Financial resource strain: Not hard at all  . Food insecurity:    Worry: Never true    Inability: Never true  . Transportation needs:     Medical: No    Non-medical: No  Tobacco Use  . Smoking status: Current Every Day Smoker    Packs/day: 1.00    Years: 30.00    Pack years: 30.00    Types: Cigarettes  . Smokeless tobacco: Never Used  Substance and Sexual Activity  . Alcohol use: No  . Drug use: No  . Sexual activity: Never  Lifestyle  . Physical activity:    Days per week: 0 days    Minutes per session: 0 min  . Stress: Not at all  Relationships  . Social connections:    Talks on phone: More than three times a week    Gets together: More than three times a week    Attends religious service: More than 4 times per year    Active member of club or organization: No    Attends meetings of clubs or organizations: Never    Relationship status: Widowed  . Intimate partner violence:    Fear of current or ex partner: No    Emotionally abused: No    Physically abused: No    Forced sexual activity: No  Other Topics Concern  . Not on file  Social History Narrative  . Not on file   Social History   Tobacco Use  Smoking Status Current Every Day Smoker  . Packs/day: 1.00  . Years: 30.00  . Pack years: 30.00  . Types: Cigarettes  Smokeless Tobacco Never Used   Social History   Substance and Sexual Activity  Alcohol Use No    Family History:  Family History  Problem Relation Age of Onset  . Stroke Mother   . Heart disease Father   . Heart attack Maternal Grandfather     Past medical history, surgical history, medications, allergies, family history and social history reviewed with patient today and changes made to appropriate areas of the chart.   Review of Systems  Constitutional: Negative.   HENT: Negative.   Eyes: Negative.   Respiratory: Positive for cough and wheezing. Negative for hemoptysis, sputum production and shortness of breath.   Cardiovascular: Negative.   Gastrointestinal: Negative.   Genitourinary: Positive for dysuria.  Musculoskeletal: Negative.   Skin: Negative.   Neurological:  Negative.   Endo/Heme/Allergies: Negative for environmental allergies and polydipsia. Bruises/bleeds easily.  Psychiatric/Behavioral: Negative.     All other ROS negative except what is listed above and in the HPI.      Objective:    BP 122/62 (BP Location: Left Arm, Cuff Size: Normal)   Pulse (!) 49   Temp (!) 97.4 F (36.3 C)   Ht 5\' 2"  (1.575 m)   Wt 110 lb 4 oz (50 kg)   SpO2 97%   BMI 20.16 kg/m   Wt Readings from Last 3 Encounters:  06/29/18 110 lb 4 oz (50 kg)  06/25/18 113 lb (51.3 kg)  05/03/18 109 lb (49.4 kg)    Physical Exam  Constitutional: She is oriented to person, place, and time. She appears well-developed and well-nourished. She appears cachectic. No  distress.  HENT:  Head: Normocephalic and atraumatic.  Right Ear: Hearing, tympanic membrane, external ear and ear canal normal.  Left Ear: Hearing, tympanic membrane, external ear and ear canal normal.  Nose: Nose normal.  Mouth/Throat: Uvula is midline, oropharynx is clear and moist and mucous membranes are normal. No oropharyngeal exudate.  Eyes: Pupils are equal, round, and reactive to light. Conjunctivae, EOM and lids are normal. Right eye exhibits no discharge. Left eye exhibits no discharge. No scleral icterus.  Neck: Normal range of motion. Neck supple. No JVD present. No tracheal deviation present. No thyromegaly present.  Cardiovascular: Normal rate, regular rhythm, normal heart sounds and intact distal pulses. Exam reveals no gallop and no friction rub.  No murmur heard. Pulmonary/Chest: Effort normal. No stridor. No respiratory distress. She has wheezes. She has no rales. She exhibits no tenderness.  Abdominal: Soft. Bowel sounds are normal. She exhibits no distension and no mass. There is no tenderness. There is no rebound and no guarding. No hernia.  Genitourinary:  Genitourinary Comments: Breast and pelvic exams deferred with shared decision making  Musculoskeletal: Normal range of motion. She  exhibits no edema, tenderness or deformity.  Lymphadenopathy:    She has no cervical adenopathy.  Neurological: She is alert and oriented to person, place, and time. She displays normal reflexes. No cranial nerve deficit or sensory deficit. She exhibits normal muscle tone. Coordination normal.  Skin: Skin is warm, dry and intact. Capillary refill takes less than 2 seconds. No rash noted. She is not diaphoretic. No erythema. No pallor.  Psychiatric: She has a normal mood and affect. Her speech is normal and behavior is normal. Judgment and thought content normal. Cognition and memory are normal.  Nursing note and vitals reviewed.   Results for orders placed or performed in visit on 12/25/17  Comprehensive metabolic panel  Result Value Ref Range   Glucose 147 (H) 65 - 99 mg/dL   BUN 25 8 - 27 mg/dL   Creatinine, Ser 1.27 (H) 0.57 - 1.00 mg/dL   GFR calc non Af Amer 40 (L) >59 mL/min/1.73   GFR calc Af Amer 46 (L) >59 mL/min/1.73   BUN/Creatinine Ratio 20 12 - 28   Sodium 136 134 - 144 mmol/L   Potassium 4.3 3.5 - 5.2 mmol/L   Chloride 95 (L) 96 - 106 mmol/L   CO2 24 20 - 29 mmol/L   Calcium 9.6 8.7 - 10.3 mg/dL   Total Protein 7.1 6.0 - 8.5 g/dL   Albumin 4.2 3.5 - 4.8 g/dL   Globulin, Total 2.9 1.5 - 4.5 g/dL   Albumin/Globulin Ratio 1.4 1.2 - 2.2   Bilirubin Total 0.2 0.0 - 1.2 mg/dL   Alkaline Phosphatase 77 39 - 117 IU/L   AST 21 0 - 40 IU/L   ALT 12 0 - 32 IU/L  Lipid Panel w/o Chol/HDL Ratio  Result Value Ref Range   Cholesterol, Total 200 (H) 100 - 199 mg/dL   Triglycerides 143 0 - 149 mg/dL   HDL 85 >39 mg/dL   VLDL Cholesterol Cal 29 5 - 40 mg/dL   LDL Calculated 86 0 - 99 mg/dL  TSH  Result Value Ref Range   TSH 1.960 0.450 - 4.500 uIU/mL  CBC with Differential/Platelet  Result Value Ref Range   WBC 9.4 3.4 - 10.8 x10E3/uL   RBC 4.58 3.77 - 5.28 x10E6/uL   Hemoglobin 14.3 11.1 - 15.9 g/dL   Hematocrit 43.8 34.0 - 46.6 %   MCV 96 79 -  97 fL   MCH 31.2 26.6 -  33.0 pg   MCHC 32.6 31.5 - 35.7 g/dL   RDW 13.5 12.3 - 15.4 %   Platelets 288 150 - 379 x10E3/uL   Neutrophils 76 Not Estab. %   Lymphs 15 Not Estab. %   Monocytes 7 Not Estab. %   Eos 2 Not Estab. %   Basos 0 Not Estab. %   Neutrophils Absolute 7.1 (H) 1.4 - 7.0 x10E3/uL   Lymphocytes Absolute 1.5 0.7 - 3.1 x10E3/uL   Monocytes Absolute 0.7 0.1 - 0.9 x10E3/uL   EOS (ABSOLUTE) 0.1 0.0 - 0.4 x10E3/uL   Basophils Absolute 0.0 0.0 - 0.2 x10E3/uL   Immature Granulocytes 0 Not Estab. %   Immature Grans (Abs) 0.0 0.0 - 0.1 x10E3/uL  B12 and Folate Panel  Result Value Ref Range   Vitamin B-12 555 232 - 1,245 pg/mL   Folate 4.4 >3.0 ng/mL      Assessment & Plan:   Problem List Items Addressed This Visit      Cardiovascular and Mediastinum   Coronary atherosclerosis    Will keep BP and cholesterol under good control. Continue current regimen. Continue to monitor. Call with any concerns.       Relevant Medications   amLODipine (NORVASC) 10 MG tablet   lisinopril (PRINIVIL,ZESTRIL) 5 MG tablet   metoprolol tartrate (LOPRESSOR) 25 MG tablet   Other Relevant Orders   CBC with Differential/Platelet   Comprehensive metabolic panel   Lipid Panel w/o Chol/HDL Ratio     Endocrine   Hypothyroidism    Rechecking levels today. Will adjust dose as needed. Call with any concerns. Continue to monitor.       Relevant Medications   metoprolol tartrate (LOPRESSOR) 25 MG tablet   Other Relevant Orders   CBC with Differential/Platelet   Comprehensive metabolic panel   TSH     Musculoskeletal and Integument   Osteoporosis    Continue current regimen. Continue to monitor. Call with any concerns. Continue to follow with endocrinology.      Relevant Medications   denosumab (PROLIA) 60 MG/ML SOSY injection   Other Relevant Orders   CBC with Differential/Platelet   Comprehensive metabolic panel   VITAMIN D 25 Hydroxy (Vit-D Deficiency, Fractures)     Genitourinary   Benign hypertensive  renal disease    Under good control on recheck. Continue to monitor. Call with any concerns. Refills given.       Relevant Medications   amLODipine (NORVASC) 10 MG tablet   metoprolol tartrate (LOPRESSOR) 25 MG tablet   Other Relevant Orders   CBC with Differential/Platelet   Comprehensive metabolic panel   Microalbumin, Urine Waived     Other   Hyperlipidemia    Statin intolerant. Could not tolerate zetia. Call with any concerns. Rechecking levels today.      Relevant Medications   amLODipine (NORVASC) 10 MG tablet   lisinopril (PRINIVIL,ZESTRIL) 5 MG tablet   metoprolol tartrate (LOPRESSOR) 25 MG tablet   Other Relevant Orders   CBC with Differential/Platelet   Comprehensive metabolic panel   Lipid Panel w/o Chol/HDL Ratio   Pernicious anemia    Checking labs again today. Await results. Call with any concerns.       Relevant Orders   CBC with Differential/Platelet   Comprehensive metabolic panel   Tobacco abuse    Not interested in quitting. She knows we are here if she changes her mind. Call with any concerns.  Relevant Orders   CBC with Differential/Platelet   Comprehensive metabolic panel   UA/M w/rflx Culture, Routine    Other Visit Diagnoses    Routine general medical examination at a health care facility    -  Primary   Vaccines up to date. Screening labs checked today. Continue diet and exercise. Call with any concerns. DEXA up to date.    Essential hypertension       Relevant Medications   amLODipine (NORVASC) 10 MG tablet   lisinopril (PRINIVIL,ZESTRIL) 5 MG tablet   metoprolol tartrate (LOPRESSOR) 25 MG tablet       Follow up plan: Return in about 6 months (around 12/30/2018) for Follow up.   LABORATORY TESTING:  - Pap smear: not applicable  IMMUNIZATIONS:   - Tdap: Tetanus vaccination status reviewed: last tetanus booster within 10 years. - Influenza: Postponed to flu season - Pneumovax: Up to date - Prevnar: Up to  date  SCREENING: -Mammogram: Not applicable  - Colonoscopy: Not applicable  - Bone Density: Up to date   PATIENT COUNSELING:   Advised to take 1 mg of folate supplement per day if capable of pregnancy.   Sexuality: Discussed sexually transmitted diseases, partner selection, use of condoms, avoidance of unintended pregnancy  and contraceptive alternatives.   Advised to avoid cigarette smoking.  I discussed with the patient that most people either abstain from alcohol or drink within safe limits (<=14/week and <=4 drinks/occasion for males, <=7/weeks and <= 3 drinks/occasion for females) and that the risk for alcohol disorders and other health effects rises proportionally with the number of drinks per week and how often a drinker exceeds daily limits.  Discussed cessation/primary prevention of drug use and availability of treatment for abuse.   Diet: Encouraged to adjust caloric intake to maintain  or achieve ideal body weight, to reduce intake of dietary saturated fat and total fat, to limit sodium intake by avoiding high sodium foods and not adding table salt, and to maintain adequate dietary potassium and calcium preferably from fresh fruits, vegetables, and low-fat dairy products.    stressed the importance of regular exercise  Injury prevention: Discussed safety belts, safety helmets, smoke detector, smoking near bedding or upholstery.   Dental health: Discussed importance of regular tooth brushing, flossing, and dental visits.    NEXT PREVENTATIVE PHYSICAL DUE IN 1 YEAR. Return in about 6 months (around 12/30/2018) for Follow up.

## 2018-06-29 NOTE — Assessment & Plan Note (Signed)
Under good control on recheck. Continue to monitor. Call with any concerns. Refills given.

## 2018-06-29 NOTE — Assessment & Plan Note (Signed)
Will keep BP and cholesterol under good control. Continue current regimen. Continue to monitor. Call with any concerns.  

## 2018-06-29 NOTE — Assessment & Plan Note (Signed)
Not interested in quitting. She knows we are here if she changes her mind. Call with any concerns.

## 2018-06-29 NOTE — Assessment & Plan Note (Signed)
Checking labs again today. Await results. Call with any concerns.

## 2018-06-29 NOTE — Assessment & Plan Note (Signed)
Continue current regimen. Continue to monitor. Call with any concerns. Continue to follow with endocrinology.

## 2018-06-29 NOTE — Assessment & Plan Note (Signed)
Rechecking levels today. Will adjust dose as needed. Call with any concerns. Continue to monitor.

## 2018-06-29 NOTE — Assessment & Plan Note (Signed)
Statin intolerant. Could not tolerate zetia. Call with any concerns. Rechecking levels today.

## 2018-06-30 LAB — COMPREHENSIVE METABOLIC PANEL
ALT: 10 IU/L (ref 0–32)
AST: 18 IU/L (ref 0–40)
Albumin/Globulin Ratio: 1.3 (ref 1.2–2.2)
Albumin: 3.9 g/dL (ref 3.5–4.8)
Alkaline Phosphatase: 61 IU/L (ref 39–117)
BUN/Creatinine Ratio: 21 (ref 12–28)
BUN: 22 mg/dL (ref 8–27)
Bilirubin Total: 0.3 mg/dL (ref 0.0–1.2)
CO2: 24 mmol/L (ref 20–29)
Calcium: 9.3 mg/dL (ref 8.7–10.3)
Chloride: 96 mmol/L (ref 96–106)
Creatinine, Ser: 1.07 mg/dL — ABNORMAL HIGH (ref 0.57–1.00)
GFR calc Af Amer: 57 mL/min/{1.73_m2} — ABNORMAL LOW (ref >59)
GFR calc non Af Amer: 49 mL/min/{1.73_m2} — ABNORMAL LOW (ref >59)
Globulin, Total: 3.1 g/dL (ref 1.5–4.5)
Glucose: 95 mg/dL (ref 65–99)
Potassium: 4.5 mmol/L (ref 3.5–5.2)
Sodium: 134 mmol/L (ref 134–144)
Total Protein: 7 g/dL (ref 6.0–8.5)

## 2018-06-30 LAB — CBC WITH DIFFERENTIAL/PLATELET
Basophils Absolute: 0 10*3/uL (ref 0.0–0.2)
Basos: 1 %
EOS (ABSOLUTE): 0.1 10*3/uL (ref 0.0–0.4)
Eos: 2 %
Hematocrit: 41 % (ref 34.0–46.6)
Hemoglobin: 13.9 g/dL (ref 11.1–15.9)
Immature Grans (Abs): 0 10*3/uL (ref 0.0–0.1)
Immature Granulocytes: 0 %
Lymphocytes Absolute: 1.7 10*3/uL (ref 0.7–3.1)
Lymphs: 27 %
MCH: 30.8 pg (ref 26.6–33.0)
MCHC: 33.9 g/dL (ref 31.5–35.7)
MCV: 91 fL (ref 79–97)
Monocytes Absolute: 0.6 10*3/uL (ref 0.1–0.9)
Monocytes: 9 %
Neutrophils Absolute: 3.9 10*3/uL (ref 1.4–7.0)
Neutrophils: 61 %
Platelets: 262 10*3/uL (ref 150–450)
RBC: 4.52 x10E6/uL (ref 3.77–5.28)
RDW: 14.2 % (ref 12.3–15.4)
WBC: 6.4 10*3/uL (ref 3.4–10.8)

## 2018-06-30 LAB — VITAMIN D 25 HYDROXY (VIT D DEFICIENCY, FRACTURES): Vit D, 25-Hydroxy: 19.1 ng/mL — ABNORMAL LOW (ref 30.0–100.0)

## 2018-06-30 LAB — LIPID PANEL W/O CHOL/HDL RATIO
Cholesterol, Total: 236 mg/dL — ABNORMAL HIGH (ref 100–199)
HDL: 77 mg/dL (ref >39)
LDL Calculated: 136 mg/dL — ABNORMAL HIGH (ref 0–99)
Triglycerides: 114 mg/dL (ref 0–149)
VLDL Cholesterol Cal: 23 mg/dL (ref 5–40)

## 2018-06-30 LAB — TSH: TSH: 0.559 u[IU]/mL (ref 0.450–4.500)

## 2018-07-02 ENCOUNTER — Encounter: Payer: Self-pay | Admitting: Family Medicine

## 2018-08-01 DIAGNOSIS — R69 Illness, unspecified: Secondary | ICD-10-CM | POA: Diagnosis not present

## 2018-11-16 ENCOUNTER — Ambulatory Visit (INDEPENDENT_AMBULATORY_CARE_PROVIDER_SITE_OTHER): Payer: Medicare HMO

## 2018-11-16 ENCOUNTER — Other Ambulatory Visit: Payer: Self-pay | Admitting: Endocrinology

## 2018-11-16 DIAGNOSIS — M810 Age-related osteoporosis without current pathological fracture: Secondary | ICD-10-CM

## 2018-11-16 DIAGNOSIS — E559 Vitamin D deficiency, unspecified: Secondary | ICD-10-CM

## 2018-11-16 MED ORDER — DENOSUMAB 60 MG/ML ~~LOC~~ SOSY
60.0000 mg | PREFILLED_SYRINGE | Freq: Once | SUBCUTANEOUS | Status: AC
  Administered 2018-11-16: 60 mg via SUBCUTANEOUS

## 2018-11-16 NOTE — Progress Notes (Signed)
Per orders of Dr. Elayne Snare injection of prolia 60mg Ardyth Harps today by Lolita Rieger RMA . Patient tolerated injection well.

## 2018-12-05 ENCOUNTER — Other Ambulatory Visit: Payer: Self-pay | Admitting: Family Medicine

## 2018-12-30 NOTE — Progress Notes (Deleted)
There were no vitals taken for this visit.   Subjective:    Patient ID: Cathy Solis, female    DOB: 1941-01-24, 78 y.o.   MRN: [id number]  HPI: Cathy Solis is a 78 y.o. female  No chief complaint on file.  HYPERTENSION / HYPERLIPIDEMIA Satisfied with current treatment? {Blank single:19197::"yes","no"} Duration of hypertension: {Blank single:19197::"chronic","months","years"} BP monitoring frequency: {Blank single:19197::"not checking","rarely","daily","weekly","monthly","a few times a day","a few times a week","a few times a month"} BP range:  BP medication side effects: {Blank single:19197::"yes","no"} Past BP meds: {Blank UYQIHKVQ:25956::"LOVF","IEPPIRJJOA","CZYSAYTKZS/WFUXNATFTD","DUKGURKY","HCWCBJSEGB","TDVVOHYWVP/XTGG","YIRSWNIOEV (bystolic)","carvedilol","chlorthalidone","clonidine","diltiazem","exforge HCT","HCTZ","irbesartan (avapro)","labetalol","lisinopril","lisinopril-HCTZ","losartan (cozaar)","methyldopa","nifedipine","olmesartan (benicar)","olmesartan-HCTZ","quinapril","ramipril","spironalactone","tekturna","valsartan","valsartan-HCTZ","verapamil"} Duration of hyperlipidemia: {Blank single:19197::"chronic","months","years"} Cholesterol medication side effects: {Blank single:19197::"yes","no"} Cholesterol supplements: {Blank multiple:19196::"none","fish oil","niacin","red yeast rice"} Past cholesterol medications: {Blank multiple:19196::"none","atorvastain (lipitor)","lovastatin (mevacor)","pravastatin (pravachol)","rosuvastatin (crestor)","simvastatin (zocor)","vytorin","fenofibrate (tricor)","gemfibrozil","ezetimide (zetia)","niaspan","lovaza"} Medication compliance: {Blank single:19197::"excellent compliance","good compliance","fair compliance","poor compliance"} Aspirin: {Blank single:19197::"yes","no"} Recent stressors: {Blank single:19197::"yes","no"} Recurrent headaches: {Blank single:19197::"yes","no"} Visual changes: {Blank  single:19197::"yes","no"} Palpitations: {Blank single:19197::"yes","no"} Dyspnea: {Blank single:19197::"yes","no"} Chest pain: {Blank single:19197::"yes","no"} Lower extremity edema: {Blank single:19197::"yes","no"} Dizzy/lightheaded: {Blank single:19197::"yes","no"}  HYPOTHYROIDISM Thyroid control status:{Blank single:19197::"controlled","uncontrolled","better","worse","exacerbated","stable"} Satisfied with current treatment? {Blank single:19197::"yes","no"} Medication side effects: {Blank single:19197::"yes","no"} Medication compliance: {Blank single:19197::"excellent compliance","good compliance","fair compliance","poor compliance"} Etiology of hypothyroidism:  Recent dose adjustment:{Blank single:19197::"yes","no"} Fatigue: {Blank single:19197::"yes","no"} Cold intolerance: {Blank single:19197::"yes","no"} Heat intolerance: {Blank single:19197::"yes","no"} Weight gain: {Blank single:19197::"yes","no"} Weight loss: {Blank single:19197::"yes","no"} Constipation: {Blank single:19197::"yes","no"} Diarrhea/loose stools: {Blank single:19197::"yes","no"} Palpitations: {Blank single:19197::"yes","no"} Lower extremity edema: {Blank single:19197::"yes","no"} Anxiety/depressed mood: {Blank single:19197::"yes","no"}  ANEMIA Anemia status: {Blank single:19197::"controlled","uncontrolled","better","worse","exacerbated","stable"} Etiology of anemia: Duration of anemia treatment:  Compliance with treatment: {Blank single:19197::"excellent compliance","good compliance","fair compliance","poor compliance"} Iron supplementation side effects: {Blank single:19197::"yes","no"} Severity of anemia: {Blank single:19197::"mild","moderate","severe"} Fatigue: {Blank single:19197::"yes","no"} Decreased exercise tolerance: {Blank single:19197::"yes","no"}  Dyspnea on exertion: {Blank single:19197::"yes","no"} Palpitations: {Blank single:19197::"yes","no"} Bleeding: {Blank single:19197::"yes","no"} Pica:  {Blank single:19197::"yes","no"}   Relevant past medical, surgical, family and social history reviewed and updated as indicated. Interim medical history since our last visit reviewed. Allergies and medications reviewed and updated.  Review of Systems  Per HPI unless specifically indicated above     Objective:    There were no vitals taken for this visit.  Wt Readings from Last 3 Encounters:  06/29/18 110 lb 4 oz (50 kg)  06/25/18 113 lb (51.3 kg)  05/03/18 109 lb (49.4 kg)    Physical Exam  Results for orders placed or performed in visit on 06/29/18  CBC with Differential/Platelet  Result Value Ref Range   WBC 6.4 3.4 - 10.8 x10E3/uL   RBC 4.52 3.77 - 5.28 x10E6/uL   Hemoglobin 13.9 11.1 - 15.9 g/dL   Hematocrit 41.0 34.0 - 46.6 %   MCV 91 79 - 97 fL   MCH 30.8 26.6 - 33.0 pg   MCHC 33.9 31.5 - 35.7 g/dL   RDW 14.2 12.3 - 15.4 %   Platelets 262 150 - 450 x10E3/uL   Neutrophils 61 Not Estab. %   Lymphs 27 Not Estab. %   Monocytes 9 Not Estab. %   Eos 2 Not Estab. %   Basos 1 Not Estab. %   Neutrophils Absolute 3.9 1.4 - 7.0 x10E3/uL   Lymphocytes Absolute 1.7 0.7 - 3.1 x10E3/uL   Monocytes Absolute 0.6 0.1 - 0.9 x10E3/uL   EOS (ABSOLUTE) 0.1 0.0 - 0.4 x10E3/uL   Basophils Absolute 0.0 0.0 - 0.2 x10E3/uL   Immature Granulocytes 0 Not Estab. %   Immature Grans (Abs) 0.0 0.0 - 0.1 x10E3/uL  Comprehensive metabolic panel  Result Value Ref Range   Glucose 95 65 - 99 mg/dL   BUN 22  8 - 27 mg/dL   Creatinine, Ser 1.07 (H) 0.57 - 1.00 mg/dL   GFR calc non Af Amer 49 (L) >59 mL/min/1.73   GFR calc Af Amer 57 (L) >59 mL/min/1.73   BUN/Creatinine Ratio 21 12 - 28   Sodium 134 134 - 144 mmol/L   Potassium 4.5 3.5 - 5.2 mmol/L   Chloride 96 96 - 106 mmol/L   CO2 24 20 - 29 mmol/L   Calcium 9.3 8.7 - 10.3 mg/dL   Total Protein 7.0 6.0 - 8.5 g/dL   Albumin 3.9 3.5 - 4.8 g/dL   Globulin, Total 3.1 1.5 - 4.5 g/dL   Albumin/Globulin Ratio 1.3 1.2 - 2.2   Bilirubin Total 0.3  0.0 - 1.2 mg/dL   Alkaline Phosphatase 61 39 - 117 IU/L   AST 18 0 - 40 IU/L   ALT 10 0 - 32 IU/L  Lipid Panel w/o Chol/HDL Ratio  Result Value Ref Range   Cholesterol, Total 236 (H) 100 - 199 mg/dL   Triglycerides 114 0 - 149 mg/dL   HDL 77 >39 mg/dL   VLDL Cholesterol Cal 23 5 - 40 mg/dL   LDL Calculated 136 (H) 0 - 99 mg/dL  TSH  Result Value Ref Range   TSH 0.559 0.450 - 4.500 uIU/mL  VITAMIN D 25 Hydroxy (Vit-D Deficiency, Fractures)  Result Value Ref Range   Vit D, 25-Hydroxy 19.1 (L) 30.0 - 100.0 ng/mL      Assessment & Plan:   Problem List Items Addressed This Visit      Cardiovascular and Mediastinum   Coronary atherosclerosis - Primary     Endocrine   Hypothyroidism     Musculoskeletal and Integument   Osteoporosis     Genitourinary   Benign hypertensive renal disease     Other   Hyperlipidemia   Pernicious anemia   Tobacco abuse       Follow up plan: No follow-ups on file.

## 2018-12-31 ENCOUNTER — Ambulatory Visit: Payer: Medicare HMO | Admitting: Family Medicine

## 2019-01-04 ENCOUNTER — Other Ambulatory Visit: Payer: Self-pay

## 2019-01-04 ENCOUNTER — Ambulatory Visit (INDEPENDENT_AMBULATORY_CARE_PROVIDER_SITE_OTHER): Payer: Medicare HMO | Admitting: Family Medicine

## 2019-01-04 ENCOUNTER — Encounter: Payer: Self-pay | Admitting: Family Medicine

## 2019-01-04 VITALS — BP 131/74 | HR 55 | Temp 97.5°F | Ht 62.0 in | Wt 115.0 lb

## 2019-01-04 DIAGNOSIS — I129 Hypertensive chronic kidney disease with stage 1 through stage 4 chronic kidney disease, or unspecified chronic kidney disease: Secondary | ICD-10-CM

## 2019-01-04 DIAGNOSIS — I10 Essential (primary) hypertension: Secondary | ICD-10-CM

## 2019-01-04 DIAGNOSIS — J302 Other seasonal allergic rhinitis: Secondary | ICD-10-CM | POA: Diagnosis not present

## 2019-01-04 DIAGNOSIS — E039 Hypothyroidism, unspecified: Secondary | ICD-10-CM | POA: Diagnosis not present

## 2019-01-04 DIAGNOSIS — D510 Vitamin B12 deficiency anemia due to intrinsic factor deficiency: Secondary | ICD-10-CM

## 2019-01-04 DIAGNOSIS — E782 Mixed hyperlipidemia: Secondary | ICD-10-CM

## 2019-01-04 MED ORDER — METOPROLOL TARTRATE 25 MG PO TABS: 25.0000 mg | ORAL_TABLET | Freq: Two times a day (BID) | ORAL | 1 refills | Status: DC

## 2019-01-04 MED ORDER — FEXOFENADINE HCL 180 MG PO TABS: 180.0000 mg | ORAL_TABLET | Freq: Every day | ORAL | 6 refills | Status: DC

## 2019-01-04 MED ORDER — LISINOPRIL 5 MG PO TABS: ORAL_TABLET | ORAL | 1 refills | Status: DC

## 2019-01-04 MED ORDER — AMLODIPINE BESYLATE 10 MG PO TABS: 10.0000 mg | ORAL_TABLET | Freq: Every day | ORAL | 1 refills | Status: DC

## 2019-01-04 NOTE — Progress Notes (Signed)
BP 131/74   Pulse (!) 55   Temp (!) 97.5 F (36.4 C) (Oral)   Ht 5\' 2"  (1.575 m)   Wt 115 lb (52.2 kg)   SpO2 96%   BMI 21.03 kg/m    Subjective:    Patient ID: Cathy Solis, female    DOB: [birth year], 78 y.o.   MRN: [id number]  HPI: Cathy Solis is a 78 y.o. female  Chief Complaint  Patient presents with  . Hypertension    52m f/u  . Hyperlipidemia  . Hypothyroidism  . Cough    pt states if she can get something for cough. has been taking coricidin   HYPERTENSION / HYPERLIPIDEMIA Satisfied with current treatment? yes Duration of hypertension: chronic BP monitoring frequency: not checking BP medication side effects: no Past BP meds: amlodipine, lisinopril, metoprolol Duration of hyperlipidemia: chronic Cholesterol medication side effects: Not on anything Cholesterol supplements: none Past cholesterol medications: none Medication compliance: excellent compliance Aspirin: no Recent stressors: no Recurrent headaches: no Visual changes: no Palpitations: no Dyspnea: no Chest pain: no Lower extremity edema: no Dizzy/lightheaded: no  HYPOTHYROIDISM Thyroid control status:stable Satisfied with current treatment? no Medication side effects: no Medication compliance: excellent compliance Etiology of hypothyroidism:  Recent dose adjustment:no Fatigue: no Cold intolerance: no Heat intolerance: no Weight gain: no Weight loss: no Constipation: no Diarrhea/loose stools: no Palpitations: no Lower extremity edema: no Anxiety/depressed mood: no  UPPER RESPIRATORY TRACT INFECTION Duration: few months Worst symptom: running nose Fever: no Cough: yes Shortness of breath: no Wheezing: no Chest pain: no Chest tightness: no Chest congestion: no Nasal congestion: yes Runny nose: yes Post nasal drip: no Sneezing: no Sore throat: no Swollen glands: no Sinus pressure: no Headache: no Face pain: no Toothache: no Ear pain: no  Ear pressure: no  Eyes  red/itching:no Eye drainage/crusting: no  Vomiting: no Rash: no Fatigue: no Sick contacts: no Strep contacts: no  Context: better, worse, stable and fluctuating Recurrent sinusitis: no Relief with OTC cold/cough medications: no  Treatments attempted: cold/sinus    Relevant past medical, surgical, family and social history reviewed and updated as indicated. Interim medical history since our last visit reviewed. Allergies and medications reviewed and updated.  Review of Systems  Constitutional: Negative.   HENT: Positive for congestion and rhinorrhea. Negative for dental problem, drooling, ear discharge, ear pain, facial swelling, hearing loss, mouth sores, nosebleeds, postnasal drip, sinus pressure, sinus pain, sneezing, sore throat, tinnitus, trouble swallowing and voice change.   Respiratory: Negative.   Cardiovascular: Negative.   Neurological: Negative.   Psychiatric/Behavioral: Negative.     Per HPI unless specifically indicated above     Objective:    BP 131/74   Pulse (!) 55   Temp (!) 97.5 F (36.4 C) (Oral)   Ht 5\' 2"  (1.575 m)   Wt 115 lb (52.2 kg)   SpO2 96%   BMI 21.03 kg/m   Wt Readings from Last 3 Encounters:  01/04/19 115 lb (52.2 kg)  06/29/18 110 lb 4 oz (50 kg)  06/25/18 113 lb (51.3 kg)    Physical Exam Vitals signs and nursing note reviewed.  Constitutional:      General: She is not in acute distress.    Appearance: Normal appearance. She is not ill-appearing, toxic-appearing or diaphoretic.  HENT:     Head: Normocephalic and atraumatic.     Right Ear: External ear normal.     Left Ear: External ear normal.     Nose: Nose normal.  Mouth/Throat:     Mouth: Mucous membranes are moist.     Pharynx: Oropharynx is clear.  Eyes:     General: No scleral icterus.       Right eye: No discharge.        Left eye: No discharge.     Extraocular Movements: Extraocular movements intact.     Conjunctiva/sclera: Conjunctivae normal.     Pupils:  Pupils are equal, round, and reactive to light.  Neck:     Musculoskeletal: Normal range of motion and neck supple.  Cardiovascular:     Rate and Rhythm: Normal rate and regular rhythm.     Pulses: Normal pulses.     Heart sounds: Normal heart sounds. No murmur. No friction rub. No gallop.   Pulmonary:     Effort: Pulmonary effort is normal. No respiratory distress.     Breath sounds: Normal breath sounds. No stridor. No wheezing, rhonchi or rales.  Chest:     Chest wall: No tenderness.  Musculoskeletal: Normal range of motion.  Skin:    General: Skin is warm and dry.     Capillary Refill: Capillary refill takes less than 2 seconds.     Coloration: Skin is not jaundiced or pale.     Findings: No bruising, erythema, lesion or rash.  Neurological:     General: No focal deficit present.     Mental Status: She is alert and oriented to person, place, and time. Mental status is at baseline.  Psychiatric:        Mood and Affect: Mood normal.        Behavior: Behavior normal.        Thought Content: Thought content normal.        Judgment: Judgment normal.     Results for orders placed or performed in visit on 06/29/18  CBC with Differential/Platelet  Result Value Ref Range   WBC 6.4 3.4 - 10.8 x10E3/uL   RBC 4.52 3.77 - 5.28 x10E6/uL   Hemoglobin 13.9 11.1 - 15.9 g/dL   Hematocrit 41.0 34.0 - 46.6 %   MCV 91 79 - 97 fL   MCH 30.8 26.6 - 33.0 pg   MCHC 33.9 31.5 - 35.7 g/dL   RDW 14.2 12.3 - 15.4 %   Platelets 262 150 - 450 x10E3/uL   Neutrophils 61 Not Estab. %   Lymphs 27 Not Estab. %   Monocytes 9 Not Estab. %   Eos 2 Not Estab. %   Basos 1 Not Estab. %   Neutrophils Absolute 3.9 1.4 - 7.0 x10E3/uL   Lymphocytes Absolute 1.7 0.7 - 3.1 x10E3/uL   Monocytes Absolute 0.6 0.1 - 0.9 x10E3/uL   EOS (ABSOLUTE) 0.1 0.0 - 0.4 x10E3/uL   Basophils Absolute 0.0 0.0 - 0.2 x10E3/uL   Immature Granulocytes 0 Not Estab. %   Immature Grans (Abs) 0.0 0.0 - 0.1 x10E3/uL  Comprehensive  metabolic panel  Result Value Ref Range   Glucose 95 65 - 99 mg/dL   BUN 22 8 - 27 mg/dL   Creatinine, Ser 1.07 (H) 0.57 - 1.00 mg/dL   GFR calc non Af Amer 49 (L) >59 mL/min/1.73   GFR calc Af Amer 57 (L) >59 mL/min/1.73   BUN/Creatinine Ratio 21 12 - 28   Sodium 134 134 - 144 mmol/L   Potassium 4.5 3.5 - 5.2 mmol/L   Chloride 96 96 - 106 mmol/L   CO2 24 20 - 29 mmol/L   Calcium 9.3 8.7 - 10.3 mg/dL  Total Protein 7.0 6.0 - 8.5 g/dL   Albumin 3.9 3.5 - 4.8 g/dL   Globulin, Total 3.1 1.5 - 4.5 g/dL   Albumin/Globulin Ratio 1.3 1.2 - 2.2   Bilirubin Total 0.3 0.0 - 1.2 mg/dL   Alkaline Phosphatase 61 39 - 117 IU/L   AST 18 0 - 40 IU/L   ALT 10 0 - 32 IU/L  Lipid Panel w/o Chol/HDL Ratio  Result Value Ref Range   Cholesterol, Total 236 (H) 100 - 199 mg/dL   Triglycerides 114 0 - 149 mg/dL   HDL 77 >39 mg/dL   VLDL Cholesterol Cal 23 5 - 40 mg/dL   LDL Calculated 136 (H) 0 - 99 mg/dL  TSH  Result Value Ref Range   TSH 0.559 0.450 - 4.500 uIU/mL  VITAMIN D 25 Hydroxy (Vit-D Deficiency, Fractures)  Result Value Ref Range   Vit D, 25-Hydroxy 19.1 (L) 30.0 - 100.0 ng/mL      Assessment & Plan:   Problem List Items Addressed This Visit      Endocrine   Hypothyroidism    Stable. Checking levels today. Will adjust dose as needed. Call with any concerns.       Relevant Medications   metoprolol tartrate (LOPRESSOR) 25 MG tablet   Other Relevant Orders   Comprehensive metabolic panel   TSH   UA/M w/rflx Culture, Routine     Genitourinary   Benign hypertensive renal disease - Primary    Under good control on current regimen. Continue current regimen. Continue to monitor. Call with any concerns. Refills given. Checking labs today.       Relevant Medications   amLODipine (NORVASC) 10 MG tablet   metoprolol tartrate (LOPRESSOR) 25 MG tablet   Other Relevant Orders   Comprehensive metabolic panel   Microalbumin, Urine Waived   UA/M w/rflx Culture, Routine     Other    Hyperlipidemia    Under good control on current regimen. Continue current regimen. Continue to monitor. Call with any concerns. Refills given. Checking labs today.       Relevant Medications   amLODipine (NORVASC) 10 MG tablet   lisinopril (PRINIVIL,ZESTRIL) 5 MG tablet   metoprolol tartrate (LOPRESSOR) 25 MG tablet   Other Relevant Orders   Comprehensive metabolic panel   Lipid Panel w/o Chol/HDL Ratio   UA/M w/rflx Culture, Routine   Pernicious anemia    Under good control on current regimen. Continue current regimen. Continue to monitor. Call with any concerns. Refills given. Checking labs today.       Relevant Orders   CBC with Differential/Platelet   Comprehensive metabolic panel   UA/M w/rflx Culture, Routine    Other Visit Diagnoses    Essential hypertension       Relevant Medications   amLODipine (NORVASC) 10 MG tablet   lisinopril (PRINIVIL,ZESTRIL) 5 MG tablet   metoprolol tartrate (LOPRESSOR) 25 MG tablet   Seasonal allergic rhinitis, unspecified trigger       Will start allegra. Call with any concerns. Continue to monitor.        Follow up plan: Return in about 3 months (around 04/04/2019).

## 2019-01-04 NOTE — Assessment & Plan Note (Signed)
Under good control on current regimen. Continue current regimen. Continue to monitor. Call with any concerns. Refills given. Checking labs today.  

## 2019-01-04 NOTE — Assessment & Plan Note (Signed)
Stable. Checking levels today. Will adjust dose as needed. Call with any concerns.

## 2019-01-05 ENCOUNTER — Telehealth: Payer: Self-pay | Admitting: Family Medicine

## 2019-01-05 DIAGNOSIS — N289 Disorder of kidney and ureter, unspecified: Secondary | ICD-10-CM

## 2019-01-05 LAB — COMPREHENSIVE METABOLIC PANEL
ALT: 13 IU/L (ref 0–32)
AST: 20 IU/L (ref 0–40)
Albumin/Globulin Ratio: 1.4 (ref 1.2–2.2)
Albumin: 3.7 g/dL (ref 3.7–4.7)
Alkaline Phosphatase: 53 IU/L (ref 39–117)
BUN/Creatinine Ratio: 19 (ref 12–28)
BUN: 23 mg/dL (ref 8–27)
Bilirubin Total: 0.2 mg/dL (ref 0.0–1.2)
CO2: 26 mmol/L (ref 20–29)
Calcium: 9.5 mg/dL (ref 8.7–10.3)
Chloride: 97 mmol/L (ref 96–106)
Creatinine, Ser: 1.23 mg/dL — ABNORMAL HIGH (ref 0.57–1.00)
GFR calc Af Amer: 48 mL/min/{1.73_m2} — ABNORMAL LOW (ref >59)
GFR calc non Af Amer: 42 mL/min/{1.73_m2} — ABNORMAL LOW (ref >59)
Globulin, Total: 2.6 g/dL (ref 1.5–4.5)
Glucose: 68 mg/dL (ref 65–99)
Potassium: 4 mmol/L (ref 3.5–5.2)
Sodium: 137 mmol/L (ref 134–144)
Total Protein: 6.3 g/dL (ref 6.0–8.5)

## 2019-01-05 LAB — LIPID PANEL W/O CHOL/HDL RATIO
Cholesterol, Total: 212 mg/dL — ABNORMAL HIGH (ref 100–199)
HDL: 80 mg/dL (ref >39)
LDL Calculated: 113 mg/dL — ABNORMAL HIGH (ref 0–99)
Triglycerides: 94 mg/dL (ref 0–149)
VLDL Cholesterol Cal: 19 mg/dL (ref 5–40)

## 2019-01-05 LAB — CBC WITH DIFFERENTIAL/PLATELET
Basophils Absolute: 0 10*3/uL (ref 0.0–0.2)
Basos: 1 %
EOS (ABSOLUTE): 0.1 10*3/uL (ref 0.0–0.4)
Eos: 2 %
Hematocrit: 39.3 % (ref 34.0–46.6)
Hemoglobin: 13.3 g/dL (ref 11.1–15.9)
Immature Grans (Abs): 0 10*3/uL (ref 0.0–0.1)
Immature Granulocytes: 0 %
Lymphocytes Absolute: 1.3 10*3/uL (ref 0.7–3.1)
Lymphs: 20 %
MCH: 31.5 pg (ref 26.6–33.0)
MCHC: 33.8 g/dL (ref 31.5–35.7)
MCV: 93 fL (ref 79–97)
Monocytes Absolute: 0.7 10*3/uL (ref 0.1–0.9)
Monocytes: 10 %
Neutrophils Absolute: 4.5 10*3/uL (ref 1.4–7.0)
Neutrophils: 67 %
Platelets: 247 10*3/uL (ref 150–450)
RBC: 4.22 x10E6/uL (ref 3.77–5.28)
RDW: 12.5 % (ref 11.7–15.4)
WBC: 6.6 10*3/uL (ref 3.4–10.8)

## 2019-01-05 LAB — TSH: TSH: 2.95 u[IU]/mL (ref 0.450–4.500)

## 2019-01-05 NOTE — Telephone Encounter (Signed)
Please let her know that her labs look OK, but her kidney function has gotten a little worse. I'd like her to drink a whole lot of water and come back in in 2 weeks for a recheck. Order in.

## 2019-01-06 MED ORDER — NITROFURANTOIN MONOHYD MACRO 100 MG PO CAPS: 100.0000 mg | ORAL_CAPSULE | Freq: Two times a day (BID) | ORAL | 0 refills | Status: DC

## 2019-01-06 NOTE — Telephone Encounter (Signed)
It also looks like she has a little UTI- I've sent an antibiotic to her pharmacy. If it's not the right one, we'll let her know. Thanks!

## 2019-01-07 LAB — UA/M W/RFLX CULTURE, ROUTINE
Bilirubin, UA: NEGATIVE
Glucose, UA: NEGATIVE
Ketones, UA: NEGATIVE
Nitrite, UA: NEGATIVE
Specific Gravity, UA: 1.02 (ref 1.005–1.030)
Urobilinogen, Ur: 0.2 mg/dL (ref 0.2–1.0)
pH, UA: 6.5 (ref 5.0–7.5)

## 2019-01-07 LAB — MICROSCOPIC EXAMINATION: WBC, UA: >30 /hpf — AB (ref 0–5)

## 2019-01-07 LAB — MICROALBUMIN, URINE WAIVED
Creatinine, Urine Waived: 50 mg/dL (ref 10–300)
Microalb, Ur Waived: 150 mg/L — ABNORMAL HIGH (ref 0–19)
Microalb/Creat Ratio: >300 mg/g — ABNORMAL HIGH (ref <30)

## 2019-01-07 LAB — URINE CULTURE, REFLEX

## 2019-01-07 NOTE — Telephone Encounter (Signed)
Patient notified and verbalized understanding. 

## 2019-01-14 ENCOUNTER — Emergency Department: Payer: Medicare HMO

## 2019-01-14 ENCOUNTER — Other Ambulatory Visit: Payer: Self-pay

## 2019-01-14 ENCOUNTER — Ambulatory Visit: Payer: Self-pay | Admitting: *Deleted

## 2019-01-14 ENCOUNTER — Encounter: Payer: Self-pay | Admitting: Emergency Medicine

## 2019-01-14 ENCOUNTER — Inpatient Hospital Stay
Admission: EM | Admit: 2019-01-14 | Discharge: 2019-01-16 | DRG: 190 | Disposition: A | Discharge status: Home / Self Care | Payer: Medicare HMO | Attending: Internal Medicine | Admitting: Internal Medicine

## 2019-01-14 DIAGNOSIS — L8990 Pressure ulcer of unspecified site, unspecified stage: Secondary | ICD-10-CM | POA: Diagnosis not present

## 2019-01-14 DIAGNOSIS — J441 Chronic obstructive pulmonary disease with (acute) exacerbation: Secondary | ICD-10-CM | POA: Diagnosis not present

## 2019-01-14 DIAGNOSIS — E871 Hypo-osmolality and hyponatremia: Secondary | ICD-10-CM | POA: Diagnosis not present

## 2019-01-14 DIAGNOSIS — E039 Hypothyroidism, unspecified: Secondary | ICD-10-CM | POA: Diagnosis not present

## 2019-01-14 DIAGNOSIS — E876 Hypokalemia: Secondary | ICD-10-CM

## 2019-01-14 DIAGNOSIS — Z79890 Hormone replacement therapy (postmenopausal): Secondary | ICD-10-CM

## 2019-01-14 DIAGNOSIS — N390 Urinary tract infection, site not specified: Secondary | ICD-10-CM | POA: Diagnosis present

## 2019-01-14 DIAGNOSIS — M810 Age-related osteoporosis without current pathological fracture: Secondary | ICD-10-CM | POA: Diagnosis present

## 2019-01-14 DIAGNOSIS — I2510 Atherosclerotic heart disease of native coronary artery without angina pectoris: Secondary | ICD-10-CM | POA: Diagnosis not present

## 2019-01-14 DIAGNOSIS — Z923 Personal history of irradiation: Secondary | ICD-10-CM

## 2019-01-14 DIAGNOSIS — R7989 Other specified abnormal findings of blood chemistry: Secondary | ICD-10-CM

## 2019-01-14 DIAGNOSIS — Z8249 Family history of ischemic heart disease and other diseases of the circulatory system: Secondary | ICD-10-CM

## 2019-01-14 DIAGNOSIS — Z888 Allergy status to other drugs, medicaments and biological substances status: Secondary | ICD-10-CM

## 2019-01-14 DIAGNOSIS — J9601 Acute respiratory failure with hypoxia: Secondary | ICD-10-CM | POA: Diagnosis not present

## 2019-01-14 DIAGNOSIS — A419 Sepsis, unspecified organism: Secondary | ICD-10-CM

## 2019-01-14 DIAGNOSIS — R69 Illness, unspecified: Secondary | ICD-10-CM | POA: Diagnosis not present

## 2019-01-14 DIAGNOSIS — J449 Chronic obstructive pulmonary disease, unspecified: Secondary | ICD-10-CM | POA: Diagnosis present

## 2019-01-14 DIAGNOSIS — R0689 Other abnormalities of breathing: Secondary | ICD-10-CM | POA: Diagnosis not present

## 2019-01-14 DIAGNOSIS — Z90710 Acquired absence of both cervix and uterus: Secondary | ICD-10-CM

## 2019-01-14 DIAGNOSIS — I10 Essential (primary) hypertension: Secondary | ICD-10-CM | POA: Diagnosis not present

## 2019-01-14 DIAGNOSIS — F17210 Nicotine dependence, cigarettes, uncomplicated: Secondary | ICD-10-CM | POA: Diagnosis present

## 2019-01-14 DIAGNOSIS — R0602 Shortness of breath: Secondary | ICD-10-CM | POA: Diagnosis not present

## 2019-01-14 DIAGNOSIS — R0902 Hypoxemia: Secondary | ICD-10-CM | POA: Diagnosis not present

## 2019-01-14 DIAGNOSIS — Z853 Personal history of malignant neoplasm of breast: Secondary | ICD-10-CM

## 2019-01-14 DIAGNOSIS — I69354 Hemiplegia and hemiparesis following cerebral infarction affecting left non-dominant side: Secondary | ICD-10-CM | POA: Diagnosis not present

## 2019-01-14 DIAGNOSIS — Z823 Family history of stroke: Secondary | ICD-10-CM

## 2019-01-14 DIAGNOSIS — Z79899 Other long term (current) drug therapy: Secondary | ICD-10-CM

## 2019-01-14 DIAGNOSIS — R079 Chest pain, unspecified: Secondary | ICD-10-CM | POA: Diagnosis not present

## 2019-01-14 DIAGNOSIS — R001 Bradycardia, unspecified: Secondary | ICD-10-CM | POA: Diagnosis not present

## 2019-01-14 DIAGNOSIS — B9620 Unspecified Escherichia coli [E. coli] as the cause of diseases classified elsewhere: Secondary | ICD-10-CM | POA: Diagnosis present

## 2019-01-14 LAB — COMPREHENSIVE METABOLIC PANEL
ALT: 16 U/L (ref 0–44)
AST: 23 U/L (ref 15–41)
Albumin: 3.7 g/dL (ref 3.5–5.0)
Alkaline Phosphatase: 47 U/L (ref 38–126)
Anion gap: 11 (ref 5–15)
BUN: 24 mg/dL — ABNORMAL HIGH (ref 8–23)
CO2: 22 mmol/L (ref 22–32)
Calcium: 8.5 mg/dL — ABNORMAL LOW (ref 8.9–10.3)
Chloride: 99 mmol/L (ref 98–111)
Creatinine, Ser: 0.93 mg/dL (ref 0.44–1.00)
GFR calc Af Amer: >60 mL/min (ref >60)
GFR calc non Af Amer: 58 mL/min — ABNORMAL LOW (ref >60)
Glucose, Bld: 153 mg/dL — ABNORMAL HIGH (ref 70–99)
Potassium: 3.1 mmol/L — ABNORMAL LOW (ref 3.5–5.1)
Sodium: 132 mmol/L — ABNORMAL LOW (ref 135–145)
Total Bilirubin: 0.7 mg/dL (ref 0.3–1.2)
Total Protein: 7.5 g/dL (ref 6.5–8.1)

## 2019-01-14 LAB — CBC
HCT: 40.5 % (ref 36.0–46.0)
Hemoglobin: 13.8 g/dL (ref 12.0–15.0)
MCH: 31.2 pg (ref 26.0–34.0)
MCHC: 34.1 g/dL (ref 30.0–36.0)
MCV: 91.6 fL (ref 80.0–100.0)
Platelets: 261 10*3/uL (ref 150–400)
RBC: 4.42 MIL/uL (ref 3.87–5.11)
RDW: 13.3 % (ref 11.5–15.5)
WBC: 12.3 10*3/uL — ABNORMAL HIGH (ref 4.0–10.5)
nRBC: 0 % (ref 0.0–0.2)

## 2019-01-14 LAB — BRAIN NATRIURETIC PEPTIDE: B Natriuretic Peptide: 300 pg/mL — ABNORMAL HIGH (ref 0.0–100.0)

## 2019-01-14 LAB — INFLUENZA PANEL BY PCR (TYPE A & B)
Influenza A By PCR: NEGATIVE
Influenza B By PCR: NEGATIVE

## 2019-01-14 LAB — TROPONIN I: Troponin I: <0.03 ng/mL (ref <0.03)

## 2019-01-14 MED ORDER — AMLODIPINE BESYLATE 10 MG PO TABS
10.0000 mg | ORAL_TABLET | Freq: Every day | ORAL | Status: DC
  Administered 2019-01-14 – 2019-01-16 (×3): 10 mg via ORAL
  Filled 2019-01-14 (×3): qty 1

## 2019-01-14 MED ORDER — ACETAMINOPHEN 325 MG PO TABS
650.0000 mg | ORAL_TABLET | Freq: Four times a day (QID) | ORAL | Status: DC | PRN
  Filled 2019-01-14: qty 2

## 2019-01-14 MED ORDER — ENOXAPARIN SODIUM 40 MG/0.4ML ~~LOC~~ SOLN
40.0000 mg | SUBCUTANEOUS | Status: DC
  Administered 2019-01-14 – 2019-01-15 (×2): 40 mg via SUBCUTANEOUS
  Filled 2019-01-14 (×2): qty 0.4

## 2019-01-14 MED ORDER — LEVOTHYROXINE SODIUM 175 MCG PO TABS
175.0000 ug | ORAL_TABLET | Freq: Every day | ORAL | Status: DC
  Administered 2019-01-15 – 2019-01-16 (×2): 175 ug via ORAL
  Filled 2019-01-14 (×2): qty 1

## 2019-01-14 MED ORDER — METHYLPREDNISOLONE SODIUM SUCC 40 MG IJ SOLR
40.0000 mg | Freq: Four times a day (QID) | INTRAMUSCULAR | Status: DC
  Administered 2019-01-14 – 2019-01-16 (×7): 40 mg via INTRAVENOUS
  Filled 2019-01-14 (×7): qty 1

## 2019-01-14 MED ORDER — METOPROLOL TARTRATE 25 MG PO TABS
25.0000 mg | ORAL_TABLET | Freq: Two times a day (BID) | ORAL | Status: DC
  Administered 2019-01-14 – 2019-01-16 (×4): 25 mg via ORAL
  Filled 2019-01-14 (×4): qty 1

## 2019-01-14 MED ORDER — SODIUM CHLORIDE 0.9 % IV SOLN
500.0000 mg | Freq: Once | INTRAVENOUS | Status: AC
  Administered 2019-01-14: 500 mg via INTRAVENOUS
  Filled 2019-01-14: qty 500

## 2019-01-14 MED ORDER — ACETAMINOPHEN 650 MG RE SUPP: 650.0000 mg | Freq: Four times a day (QID) | RECTAL | Status: DC | PRN

## 2019-01-14 MED ORDER — SODIUM CHLORIDE 0.9 % IV BOLUS
1000.0000 mL | Freq: Once | INTRAVENOUS | Status: AC
  Administered 2019-01-14: 1000 mL via INTRAVENOUS

## 2019-01-14 MED ORDER — CHOLECALCIFEROL 10 MCG (400 UNIT) PO TABS
400.0000 [IU] | ORAL_TABLET | Freq: Two times a day (BID) | ORAL | Status: DC
  Administered 2019-01-14 – 2019-01-16 (×4): 400 [IU] via ORAL
  Filled 2019-01-14 (×5): qty 1

## 2019-01-14 MED ORDER — SODIUM CHLORIDE 0.9 % IV SOLN
1.0000 g | Freq: Once | INTRAVENOUS | Status: AC
  Administered 2019-01-14: 1 g via INTRAVENOUS
  Filled 2019-01-14: qty 10

## 2019-01-14 MED ORDER — ONDANSETRON HCL 4 MG PO TABS: 4.0000 mg | ORAL_TABLET | Freq: Four times a day (QID) | ORAL | Status: DC | PRN

## 2019-01-14 MED ORDER — LORATADINE 10 MG PO TABS
10.0000 mg | ORAL_TABLET | Freq: Every day | ORAL | Status: DC
  Administered 2019-01-15 – 2019-01-16 (×2): 10 mg via ORAL
  Filled 2019-01-14 (×3): qty 1

## 2019-01-14 MED ORDER — IPRATROPIUM-ALBUTEROL 0.5-2.5 (3) MG/3ML IN SOLN
3.0000 mL | Freq: Four times a day (QID) | RESPIRATORY_TRACT | Status: DC
  Administered 2019-01-15 (×3): 3 mL via RESPIRATORY_TRACT
  Filled 2019-01-14 (×3): qty 3

## 2019-01-14 MED ORDER — NITROFURANTOIN MONOHYD MACRO 100 MG PO CAPS
100.0000 mg | ORAL_CAPSULE | Freq: Two times a day (BID) | ORAL | Status: DC
  Administered 2019-01-14 – 2019-01-15 (×2): 100 mg via ORAL
  Filled 2019-01-14 (×3): qty 1

## 2019-01-14 MED ORDER — POTASSIUM CHLORIDE CRYS ER 20 MEQ PO TBCR
40.0000 meq | EXTENDED_RELEASE_TABLET | Freq: Once | ORAL | Status: AC
  Administered 2019-01-14: 40 meq via ORAL
  Filled 2019-01-14: qty 2

## 2019-01-14 MED ORDER — BUDESONIDE 0.5 MG/2ML IN SUSP
0.5000 mg | Freq: Two times a day (BID) | RESPIRATORY_TRACT | Status: DC
  Administered 2019-01-15 – 2019-01-16 (×3): 0.5 mg via RESPIRATORY_TRACT
  Filled 2019-01-14 (×3): qty 2

## 2019-01-14 MED ORDER — IPRATROPIUM-ALBUTEROL 0.5-2.5 (3) MG/3ML IN SOLN
3.0000 mL | Freq: Once | RESPIRATORY_TRACT | Status: AC
  Administered 2019-01-14: 3 mL via RESPIRATORY_TRACT
  Filled 2019-01-14: qty 3

## 2019-01-14 MED ORDER — METHYLPREDNISOLONE SODIUM SUCC 125 MG IJ SOLR
125.0000 mg | Freq: Once | INTRAMUSCULAR | Status: DC
  Filled 2019-01-14: qty 2

## 2019-01-14 MED ORDER — ORAL CARE MOUTH RINSE
15.0000 mL | Freq: Two times a day (BID) | OROMUCOSAL | Status: DC
  Administered 2019-01-15 (×2): 15 mL via OROMUCOSAL

## 2019-01-14 MED ORDER — ONDANSETRON HCL 4 MG/2ML IJ SOLN: 4.0000 mg | Freq: Four times a day (QID) | INTRAMUSCULAR | Status: DC | PRN

## 2019-01-14 MED ORDER — LISINOPRIL 5 MG PO TABS
5.0000 mg | ORAL_TABLET | Freq: Every day | ORAL | Status: DC
  Administered 2019-01-15 – 2019-01-16 (×2): 5 mg via ORAL
  Filled 2019-01-14 (×3): qty 1

## 2019-01-14 NOTE — Care Management Obs Status (Signed)
[place] NOTIFICATION   Patient Details  Name: Cathy Solis MRN: [id number] Date of Birth: 1941/02/06   Medicare Observation Status Notification Given:  Yes    Shelbie Hutching, RN 01/14/2019, 3:35 PM

## 2019-01-14 NOTE — ED Provider Notes (Signed)
[facility] Emergency Department Provider Note  ____________________________________________  Time seen: Approximately 12:15 PM  I have reviewed the triage vital signs and the nursing notes.   HISTORY  Chief Complaint Respiratory Distress and Shortness of Breath    HPI Cathy Solis is a 78 y.o. female with a long history of tobacco abuse, CAD, presenting with shortness of breath.  The patient reports that since yesterday, she has had significant shortness of breath, even at rest.  She is unable to get up because she gets profoundly short of breath.  She has had wheezing.  She stopped smoking 1 week ago.  She says that she was unable to eat or drink since last night because of her shortness of breath.  She denies any lower extremity swelling, new cough, congestion or rhinorrhea, sore throat, ear pain, nausea vomiting or diarrhea, fevers or chills.  Past Medical History:  Diagnosis Date  . Breast cancer [facility]) 2006   Left breast, s/p radiation  . 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 Hypertension   . Kidney problem    Undeveloped R kidney  . 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 Personal history of tobacco use, presenting hazards to health 03/18/2016  . Stroke [facility])    residual left sided weakness  . Toe infection    followed by Dr. Jens Som    Patient Active Problem List   Diagnosis Date Noted  . COPD exacerbation ([place]) 01/14/2019  . Osteoporosis 11/02/2017  . Coronary atherosclerosis 05/23/2016  . Personal history of tobacco use, presenting hazards to health 03/18/2016  . Idiopathic scoliosis 11/28/2014  . Tobacco abuse 11/28/2014  . 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 Hydronephrosis 02/04/2014  . 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 Carotid stenosis 05/23/2013  . Pernicious anemia 06/26/2012  . Hyperlipidemia 12/29/2011  . Benign hypertensive renal disease  08/04/2011  . Hypothyroidism 08/04/2011    Past Surgical History:  Procedure Laterality Date  . ABDOMINAL HYSTERECTOMY    . BLADDER REPAIR    . BREAST LUMPECTOMY Left   . BREAST SURGERY     left  . CAROTID ENDARTERECTOMY  2008   left  . COLONOSCOPY  2008  . EYE SURGERY Right 2013   cataract  . KIDNEY SURGERY  1949    Current Outpatient Rx  . Order #: [id number] Class: Normal  . Order #: [id number] Class: Normal  . Order #: [id number] Class: Normal  . Order #: [id number] Class: Normal  . Order #: [id number] Class: Normal  . Order #: [id number] Class: Normal  . Order #: [id number] Class: Historical Med  . Order #: [id number] Class: Historical Med  . Order #: [id number] Class: Historical Med    Allergies Statins and Zetia [ezetimibe]  Family History  Problem Relation Age of Onset  . Stroke Mother   . Heart disease Father   . Heart attack Maternal Grandfather     Social History Social History   Tobacco Use  . Smoking status: Current Every Day Smoker    Packs/day: 1.00    Years: 30.00    Pack years: 30.00    Types: Cigarettes  . Smokeless tobacco: Never Used  Substance Use Topics  . Alcohol use: No  . Drug use: No    Review of Systems Constitutional: No fever/chills.  No lightheadedness or syncope. Eyes: No visual changes. ENT: No sore throat. No congestion or rhinorrhea. Cardiovascular: Denies chest pain. Denies palpitations. Respiratory: Positive wheezing and shortness of breath.  Positive nonproductive cough. Gastrointestinal: No abdominal pain.  No nausea, no vomiting.  No diarrhea.  No constipation. Genitourinary: Negative for dysuria. Musculoskeletal: Negative for back pain.  No lower extremity swelling and calf pain Skin: Negative for rash. Neurological: Negative for headaches. No focal numbness, tingling or weakness.     ____________________________________________   PHYSICAL EXAM:  VITAL SIGNS: ED Triage Vitals  Enc Vitals Group     BP 01/14/19 1108 (!)  159/75     Pulse Rate 01/14/19 1108 93     Resp 01/14/19 1108 (!) 21     Temp 01/14/19 1108 98.5 F (36.9 C)     Temp Source 01/14/19 1108 Axillary     SpO2 01/14/19 1108 93 %     Weight 01/14/19 1111 115 lb (52.2 kg)     Height 01/14/19 1111 5\' 3"  (1.6 m)     Head Circumference --      Peak Flow --      Pain Score 01/14/19 1111 0     Pain Loc --      Pain Edu? --      Excl. in [place]? --     Constitutional: Alert and oriented.  Answers questions appropriately.  Chronically ill-appearing. Eyes: Conjunctivae are normal.  EOMI. No scleral icterus. Head: Atraumatic. Nose: No congestion/rhinnorhea. Mouth/Throat: Mucous membranes are moist.  Neck: No stridor.  Supple.  Mild JVD.  No meningismus. Cardiovascular: Normal rate, regular rhythm. No murmurs, rubs or gallops.  Respiratory: The patient is tachypneic with accessory muscle use and retractions.  Her O2 sats are 88% on room air and up to 93% on 2 L supplemental O2.  She has a prolonged expiratory phase with end expiratory wheezing.  No rales or rhonchi. Gastrointestinal: Soft, nontender and nondistended.  No guarding or rebound.  No peritoneal signs. Musculoskeletal: No LE edema. No ttp in the calves or palpable cords.  Negative Homan's sign. Neurologic:  A&Ox3.  Speech is clear.  Face and smile are symmetric.  EOMI.  Moves all extremities well. Skin:  Skin is warm, dry and intact. No rash noted. Psychiatric: The patient is intermittently agitated.  ____________________________________________   LABS (all labs ordered are listed, but only abnormal results are displayed)  Labs Reviewed  CBC - Abnormal; Notable for the following components:      Result Value   WBC 12.3 (*)    All other components within normal limits  COMPREHENSIVE METABOLIC PANEL - Abnormal; Notable for the following components:   Sodium 132 (*)    Potassium 3.1 (*)    Glucose, Bld 153 (*)    BUN 24 (*)    Calcium 8.5 (*)    GFR calc non Af Amer 58 (*)     All other components within normal limits  BRAIN NATRIURETIC PEPTIDE - Abnormal; Notable for the following components:   B Natriuretic Peptide 300.0 (*)    All other components within normal limits  CULTURE, BLOOD (ROUTINE X 2)  CULTURE, BLOOD (ROUTINE X 2)  TROPONIN I  INFLUENZA PANEL BY PCR (TYPE A & B)  URINALYSIS, COMPLETE (UACMP) WITH MICROSCOPIC  BLOOD GAS, VENOUS   ____________________________________________  EKG  ED ECG REPORT I, Anne-Caroline Mariea Clonts, the attending physician, personally viewed and interpreted this ECG.   Date: 01/14/2019  EKG Time: 1444  Rate: 89  Rhythm: afib v sinus arrhythmia; poor baseline tracing  Axis: normal  Intervals:none  ST&T Change: No STEMI  ED ECG REPORT I, Anne-Caroline Mariea Clonts, the attending physician, personally viewed and  interpreted this ECG.   Date: 01/14/2019  EKG Time: 1458  Rate: 85  Rhythm: normal sinus w/ arrhythmia  Axis: nl  Intervals:first-degree A-V block   ST&T Change: no STEMI   ____________________________________________  RADIOLOGY  Dg Chest 2 View  Result Date: 01/14/2019 CLINICAL DATA:  Shortness of breath EXAM: CHEST - 2 VIEW COMPARISON:  Chest CT 03/29/2016 FINDINGS: The lungs are hyperinflated with diffuse coarse interstitial markings and bibasilar atelectasis. No focal consolidation or pulmonary edema. Mild cardiomegaly with calcific aortic atherosclerosis. No pleural effusion or pneumothorax. IMPRESSION: COPD with bibasilar atelectasis.  No acute findings. Electronically Signed   By: Ulyses Jarred M.D.   On: 01/14/2019 13:34    ____________________________________________   PROCEDURES  Procedure(s) performed: None  Procedures  Critical Care performed: Yes ____________________________________________   INITIAL IMPRESSION / ASSESSMENT AND PLAN / ED COURSE  Pertinent labs & imaging results that were available during my care of the patient were reviewed by me and considered in my medical  decision making (see chart for details).  78 y.o. female with a long history of tobacco abuse, quit last week, presenting with 2 days of progressively worsening shortness of breath, now unable to get around in her home due to profound shortness of breath.  The patient has a new hypoxia; she has never worn oxygen before and was found to be satting in the high 80s on room air while at rest.  She does respond to 2 L nasal cannula.  Her history is most consistent with a COPD exacerbation.  She has not been having infectious symptoms, but will get an influenza test and a chest x-ray to rule out pneumonia.  Cardiac causes for her shortness of breath are also possible, will get a screening EKG with a troponin and BNP.  Plan admission.  ----------------------------------------- 2:31 PM on 01/14/2019 -----------------------------------------  The patient's work-up is most consistent with COPD exacerbation.  Her chest x-ray does not show pneumonia.  However, she does have an elevation in her white blood cell count, and a cough; she is also dehydrated so this may represent a radiographic lag.  I will initiate antibiotics and she will be followed clinically to see if she needs a continuation of these antibiotics.  Her hypoxia and elevated white blood cell count, she does meet criteria for sepsis.  Her influenza testing is negative.  The patient's BNP is elevated at 300 but she does not have peripheral or pulmonary edema.  Her troponin is negative.  She is mildly hyponatremic and hypokalemic, which is likely due to poor p.o. intake and has received intravenous fluids and potassium supplementation.  Given her new oxygen requirement, the patient will be admitted to the hospital at this time.  CRITICAL CARE Performed by: Eula Listen   Total critical care time: 40 minutes  Critical care time was exclusive of separately billable procedures and treating other patients.  Critical care was necessary to treat or  prevent imminent or life-threatening deterioration.  Critical care was time spent personally by me on the following activities: development of treatment plan with patient and/or surrogate as well as nursing, discussions with consultants, evaluation of patient's response to treatment, examination of patient, obtaining history from patient or surrogate, ordering and performing treatments and interventions, ordering and review of laboratory studies, ordering and review of radiographic studies, pulse oximetry and re-evaluation of patient's condition.   ____________________________________________  FINAL CLINICAL IMPRESSION(S) / ED DIAGNOSES  Final diagnoses:  COPD exacerbation ([place])  Elevated brain natriuretic peptide (BNP) level  Hyponatremia  Hypokalemia  Hypoxia  Sepsis, due to unspecified organism, unspecified whether acute organ dysfunction present [facility])         NEW MEDICATIONS STARTED DURING THIS VISIT:  New Prescriptions   No medications on file      Eula Listen, MD 01/14/19 1559

## 2019-01-14 NOTE — Telephone Encounter (Signed)
Patient already at the ED. Dr. Wynetta Emery aware.

## 2019-01-14 NOTE — H&P (Signed)
[place] [facility] [place] NAME: Cathy Solis    MR#:  [id number]  DATE OF BIRTH:  1941-06-07  DATE OF ADMISSION:  01/14/2019  PRIMARY CARE PHYSICIAN: Valerie Roys, DO   REQUESTING/REFERRING PHYSICIAN: Dr. Aundria Rud  CHIEF COMPLAINT:   Chief Complaint  Patient presents with  . Respiratory Distress  . Shortness of Breath    HISTORY OF PRESENT ILLNESS:  Cathy Solis  is a 78 y.o. female with a known history of breast cancer, hypertension, COPD, history of previous CVA who presents to the hospital due to worsening shortness of breath.  Patient says she has been feeling short of breath now progressively getting worse for the past 3 to 4 days.  She admits to a cough which is nonproductive but no fever chills nausea or vomiting.  She says that today she was so short of breath that she could not even get out of bed.  She came to the ER was noted to be in acute respiratory failure with hypoxia secondary to COPD exacerbation.  Hospitalist services were contacted for admission.  Patient denies any chest pains, nausea, vomiting, abdominal pain, sick contacts, melena, hematochezia, dysuria or any other associated symptoms presently.  PAST MEDICAL HISTORY:   Past Medical History:  Diagnosis Date  . Breast cancer [facility]) 2006   Left breast, s/p radiation  . 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 Hypertension   . Kidney problem    Undeveloped R kidney  . 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 Personal history of tobacco use, presenting hazards to health 03/18/2016  . Stroke [facility])    residual left sided weakness  . Toe infection    followed by Dr. Jens Som    PAST SURGICAL HISTORY:   Past Surgical History:  Procedure Laterality Date  . ABDOMINAL HYSTERECTOMY    . BLADDER REPAIR    . BREAST LUMPECTOMY Left   . BREAST SURGERY     left  . CAROTID ENDARTERECTOMY  2008   left  . COLONOSCOPY  2008  . EYE  SURGERY Right 2013   cataract  . KIDNEY SURGERY  1949    SOCIAL HISTORY:   Social History   Tobacco Use  . Smoking status: Current Every Day Smoker    Packs/day: 1.00    Years: 30.00    Pack years: 30.00    Types: Cigarettes  . Smokeless tobacco: Never Used  Substance Use Topics  . Alcohol use: No    FAMILY HISTORY:   Family History  Problem Relation Age of Onset  . Stroke Mother   . Heart disease Father   . Heart attack Maternal Grandfather     DRUG ALLERGIES:   Allergies  Allergen Reactions  . Statins Other (See Comments)    Myalgias  . Zetia [Ezetimibe] Other (See Comments)    myalgias    REVIEW OF SYSTEMS:   Review of Systems  Constitutional: Negative for fever and weight loss.  HENT: Negative for congestion, nosebleeds and tinnitus.   Eyes: Negative for blurred vision, double vision and redness.  Respiratory: Positive for cough, shortness of breath and wheezing. Negative for hemoptysis.   Cardiovascular: Negative for chest pain, orthopnea, leg swelling and PND.  Gastrointestinal: Negative for abdominal pain, diarrhea, melena, nausea and vomiting.  Genitourinary: Negative for dysuria, hematuria and urgency.  Musculoskeletal: Negative for falls and joint pain.  Neurological: Negative for dizziness, tingling, sensory change, focal  weakness, seizures, weakness and headaches.  Endo/Heme/Allergies: Negative for polydipsia. Does not bruise/bleed easily.  Psychiatric/Behavioral: Negative for depression and memory loss. The patient is not nervous/anxious.     MEDICATIONS AT HOME:   Prior to Admission medications   Medication Sig Start Date End Date Taking? Authorizing Provider  amLODipine (NORVASC) 10 MG tablet Take 1 tablet (10 mg total) by mouth daily. 01/04/19  Yes Johnson, Megan P, DO  fexofenadine (ALLEGRA ALLERGY) 180 MG tablet Take 1 tablet (180 mg total) by mouth daily. 01/04/19  Yes Johnson, Megan P, DO  levothyroxine (SYNTHROID, LEVOTHROID) 175 MCG  tablet TAKE 1 TABLET BY MOUTH EVERY DAY 12/05/18  Yes Johnson, Megan P, DO  lisinopril (PRINIVIL,ZESTRIL) 5 MG tablet TAKE 1 TABLET BY MOUTH EVERY DAY 01/04/19  Yes Johnson, Megan P, DO  metoprolol tartrate (LOPRESSOR) 25 MG tablet Take 1 tablet (25 mg total) by mouth 2 (two) times daily. 01/04/19  Yes Johnson, Megan P, DO  nitrofurantoin, macrocrystal-monohydrate, (MACROBID) 100 MG capsule Take 1 capsule (100 mg total) by mouth 2 (two) times daily. 01/06/19  Yes Johnson, Megan P, DO  Vitamin D, Cholecalciferol, 400 units TABS Take by mouth 2 (two) times daily.   Yes [provider]  denosumab (PROLIA) 60 MG/ML SOSY injection Inject 60 mg into the skin once for 1 dose. 06/29/18   Johnson, Megan P, DO  UNABLE TO FIND as needed. [place] for cold and cough    [provider]      VITAL SIGNS:  Blood pressure (!) 159/75, pulse 93, temperature 98.5 F (36.9 C), temperature source Axillary, resp. rate (!) 21, height 5\' 3"  (1.6 m), weight 52.2 kg, SpO2 93 %.  PHYSICAL EXAMINATION:  Physical Exam  GENERAL:  78 y.o.-year-old patient lying in the bed in no acute distress.  EYES: Pupils equal, round, reactive to light and accommodation. No scleral icterus. Extraocular muscles intact.  HEENT: Head atraumatic, normocephalic. Oropharynx and nasopharynx clear. No oropharyngeal erythema, moist oral mucosa  NECK:  Supple, no jugular venous distention. No thyroid enlargement, no tenderness.  LUNGS: Good air entry bilaterally, minimal end expiratory wheezing bilaterally, no rales, rhonchi.  Negative use of accessory muscles. CARDIOVASCULAR: S1, S2 RRR. No murmurs, rubs, gallops, clicks.  ABDOMEN: Soft, nontender, nondistended. Bowel sounds present. No organomegaly or mass.  EXTREMITIES: No pedal edema, cyanosis, or clubbing. + 2 pedal & radial pulses b/l.   NEUROLOGIC: Cranial nerves II through XII are intact. No focal Motor or sensory deficits appreciated b/l PSYCHIATRIC: The patient is alert  and oriented x 3.  SKIN: No obvious rash, lesion, or ulcer.   LABORATORY PANEL:   CBC Recent Labs  Lab 01/14/19 1232  WBC 12.3*  HGB 13.8  HCT 40.5  PLT 261   ------------------------------------------------------------------------------------------------------------------  Chemistries  Recent Labs  Lab 01/14/19 1232  NA 132*  K 3.1*  CL 99  CO2 22  GLUCOSE 153*  BUN 24*  CREATININE 0.93  CALCIUM 8.5*  AST 23  ALT 16  ALKPHOS 47  BILITOT 0.7   ------------------------------------------------------------------------------------------------------------------  Cardiac Enzymes Recent Labs  Lab 01/14/19 1232  TROPONINI <0.03   ------------------------------------------------------------------------------------------------------------------  RADIOLOGY:  Dg Chest 2 View  Result Date: 01/14/2019 CLINICAL DATA:  Shortness of breath EXAM: CHEST - 2 VIEW COMPARISON:  Chest CT 03/29/2016 FINDINGS: The lungs are hyperinflated with diffuse coarse interstitial markings and bibasilar atelectasis. No focal consolidation or pulmonary edema. Mild cardiomegaly with calcific aortic atherosclerosis. No pleural effusion or pneumothorax. IMPRESSION: COPD with bibasilar atelectasis.  No acute  findings. Electronically Signed   By: Ulyses Jarred M.D.   On: 01/14/2019 13:34     IMPRESSION AND PLAN:   78 year old female with past medical history of breast cancer, hypertension, hypothyroidism, osteoporosis who presents to the hospital due to shortness of breath and cough.  1.  Acute respiratory failure with hypoxia-secondary to COPD exacerbation. - Continue O2 supplementation, will treat underlying COPD with IV steroids, scheduled duo nebs, Pulmicort nebs.  2.  COPD exacerbation-because of patient's worsening respiratory failure and hypoxia. - Chest x-ray is negative for pneumonia, this is likely secondary to ongoing tobacco abuse.  We will treat the patient with IV steroids, scheduled  duo nebs, Pulmicort nebs.  Continue oxygen, patient will need to be assessed for home oxygen prior to discharge.  She may also need to be on some maintenance inhalers.  3.  Essential hypertension-continue Norvasc, lisinopril, metoprolol.  4.  History of recent UTI- patient was noted to have urine cultures positive for E. coli which was pansensitive. -Continue her Macrobid course.  5.  History of hypothyroidism-continue Synthroid.    All the records are reviewed and case discussed with ED provider. Management plans discussed with the patient, family and they are in agreement.  CODE STATUS: Full code  TOTAL TIME TAKING CARE OF THIS PATIENT: 40 minutes.    Henreitta Leber M.D on 01/14/2019 at 3:21 PM  Between 7am to 6pm - Pager - [phone]  After 6pm go to [email or web address] - password [email or web address] [facility]  Office  [phone]  CC: Primary care physician; Valerie Roys, DO

## 2019-01-14 NOTE — Telephone Encounter (Signed)
Needs to go to ER immediately. 

## 2019-01-14 NOTE — Telephone Encounter (Signed)
Routing to provider  

## 2019-01-14 NOTE — Telephone Encounter (Signed)
   Reason for Disposition . Severe difficulty breathing (e.g., struggling for each breath, speaks in single words)    Sister is calling- patient is grunting with every breath- when asked if sister is striggling to breath- sister states yes- told her to call 911 now and she states she will  Answer Assessment - Initial Assessment Questions 1. RESPIRATORY STATUS: "Describe your breathing?" (e.g., wheezing, shortness of breath, unable to speak, severe coughing)      Grunting every breath- per sister patient looks like she is struggling to breath- advised call 911 now. Triage call stopped.  Protocols used: BREATHING DIFFICULTY-A-AH

## 2019-01-14 NOTE — ED Triage Notes (Signed)
Patient arrives from home via EMS, complains of increased SOB and productive cough, 87% RA. Patient 97% on 10L w duoneb, alert and oriented.

## 2019-01-14 NOTE — Care Management Note (Signed)
Case Management Note  Patient Details  Name: Cathy Solis MRN: [id number] Date of Birth: 1941-07-23  Subjective/Objective:  Patient is placed under observation for shortness of breath.  Patient has COPD and long history of smoking.  Patient reports she quit smoking 1 week ago.  Patient is from home and lives alone, family members live very close by and check on her ever day.  Patient reports that she is independent in ADL's every once in a while will walk with a cane.  Patient reports that she has chickens and dogs that she has to care for and needs to be discharged tomorrow.  Patient's youngest sister is at the bedside and reports that there is family that can do that for her.  Patient does not wear O2 at home, she does have a nebulizer and inhalers.  PCP is Dr. Wynetta Emery who she just saw last week for regular follow up.  Pharmacy is [facility].  Patient reports that she has all kinds of equipment at home, walker, wheelchair, hospital bed, bedside commode so she doesn't need any of that.  No needs identified at this time. Doran Clay RN BSN [phone]                   Action/Plan:   Expected Discharge Date:                  Expected Discharge Plan:  Home/Self Care  In-House Referral:     Discharge planning Services  CM Consult  Post Acute Care Choice:    Choice offered to:     DME Arranged:    DME Agency:     HH Arranged:    HH Agency:     Status of Service:  In process, will continue to follow  If discussed at Long Length of Stay Meetings, dates discussed:    Additional Comments:  Shelbie Hutching, RN 01/14/2019, 3:36 PM

## 2019-01-15 DIAGNOSIS — J441 Chronic obstructive pulmonary disease with (acute) exacerbation: Secondary | ICD-10-CM | POA: Diagnosis not present

## 2019-01-15 DIAGNOSIS — I10 Essential (primary) hypertension: Secondary | ICD-10-CM | POA: Diagnosis not present

## 2019-01-15 DIAGNOSIS — L8990 Pressure ulcer of unspecified site, unspecified stage: Secondary | ICD-10-CM

## 2019-01-15 DIAGNOSIS — R079 Chest pain, unspecified: Secondary | ICD-10-CM | POA: Diagnosis not present

## 2019-01-15 DIAGNOSIS — E039 Hypothyroidism, unspecified: Secondary | ICD-10-CM | POA: Diagnosis not present

## 2019-01-15 DIAGNOSIS — J9601 Acute respiratory failure with hypoxia: Secondary | ICD-10-CM | POA: Diagnosis not present

## 2019-01-15 LAB — BASIC METABOLIC PANEL
Anion gap: 6 (ref 5–15)
BUN: 24 mg/dL — ABNORMAL HIGH (ref 8–23)
CO2: 23 mmol/L (ref 22–32)
Calcium: 8 mg/dL — ABNORMAL LOW (ref 8.9–10.3)
Chloride: 106 mmol/L (ref 98–111)
Creatinine, Ser: 0.92 mg/dL (ref 0.44–1.00)
GFR calc Af Amer: >60 mL/min (ref >60)
GFR calc non Af Amer: 59 mL/min — ABNORMAL LOW (ref >60)
Glucose, Bld: 143 mg/dL — ABNORMAL HIGH (ref 70–99)
Potassium: 3.7 mmol/L (ref 3.5–5.1)
Sodium: 135 mmol/L (ref 135–145)

## 2019-01-15 LAB — TROPONIN I
Troponin I: <0.03 ng/mL (ref <0.03)
Troponin I: 0.03 ng/mL (ref <0.03)
Troponin I: <0.03 ng/mL (ref <0.03)

## 2019-01-15 MED ORDER — IPRATROPIUM-ALBUTEROL 0.5-2.5 (3) MG/3ML IN SOLN
3.0000 mL | Freq: Three times a day (TID) | RESPIRATORY_TRACT | Status: DC
  Administered 2019-01-16 (×2): 3 mL via RESPIRATORY_TRACT
  Filled 2019-01-15 (×3): qty 3

## 2019-01-15 MED ORDER — TRAMADOL HCL 50 MG PO TABS: 50.0000 mg | ORAL_TABLET | Freq: Four times a day (QID) | ORAL | Status: DC | PRN

## 2019-01-15 MED ORDER — NITROGLYCERIN 0.4 MG SL SUBL: 0.4000 mg | SUBLINGUAL_TABLET | SUBLINGUAL | Status: DC | PRN

## 2019-01-15 MED ORDER — IPRATROPIUM-ALBUTEROL 0.5-2.5 (3) MG/3ML IN SOLN
3.0000 mL | Freq: Four times a day (QID) | RESPIRATORY_TRACT | Status: DC | PRN
  Administered 2019-01-15: 3 mL via RESPIRATORY_TRACT

## 2019-01-15 NOTE — Progress Notes (Signed)
[place] [facility] [place] NAME: Cathy Solis    MR#:  [id number]  DATE OF BIRTH:  1941-07-28  SUBJECTIVE:  CHIEF COMPLAINT:  Pt is reporting chest tightness and chest pain intermittent episodes of cough.  Sister at bedside.  REVIEW OF SYSTEMS:  CONSTITUTIONAL: No fever, fatigue or weakness.  EYES: No blurred or double vision.  EARS, NOSE, AND THROAT: No tinnitus or ear pain.  RESPIRATORY:  intermittent episodes of cough, chest tightness shortness of breath, wheezing or hemoptysis.  CARDIOVASCULAR: No chest pain, orthopnea, edema.  GASTROINTESTINAL: No nausea, vomiting, diarrhea or abdominal pain.  GENITOURINARY: No dysuria, hematuria.  ENDOCRINE: No polyuria, nocturia,  HEMATOLOGY: No anemia, easy bruising or bleeding SKIN: No rash or lesion. MUSCULOSKELETAL: No joint pain or arthritis.   NEUROLOGIC: No tingling, numbness, weakness.  PSYCHIATRY: No anxiety or depression.   DRUG ALLERGIES:   Allergies  Allergen Reactions  . Statins Other (See Comments)    Myalgias  . Zetia [Ezetimibe] Other (See Comments)    myalgias    VITALS:  Blood pressure 119/66, pulse 78, temperature 98.5 F (36.9 C), temperature source Oral, resp. rate 19, height 5\' 4"  (1.626 m), weight 51.3 kg, SpO2 91 %.  PHYSICAL EXAMINATION:  GENERAL:  78 y.o.-year-old patient lying in the bed with no acute distress.  EYES: Pupils equal, round, reactive to light and accommodation. No scleral icterus. Extraocular muscles intact.  HEENT: Head atraumatic, normocephalic. Oropharynx and nasopharynx clear.  NECK:  Supple, no jugular venous distention. No thyroid enlargement, no tenderness.  LUNGS: Mod  breath sounds bilaterally,  Wheezing, no  rales,rhonchi or crepitation. No use of accessory muscles of respiration.  CARDIOVASCULAR: S1, S2 normal. No murmurs, rubs, or gallops.  ABDOMEN: Soft, nontender, nondistended. Bowel sounds present. No organomegaly or mass.   EXTREMITIES: No pedal edema, cyanosis, or clubbing.  NEUROLOGIC: Awake alert and oriented x3. Sensation intact. Gait not checked.  PSYCHIATRIC: The patient is alert and oriented x 3.  SKIN: No obvious rash, lesion, or ulcer.    LABORATORY PANEL:   CBC Recent Labs  Lab 01/14/19 1232  WBC 12.3*  HGB 13.8  HCT 40.5  PLT 261   ------------------------------------------------------------------------------------------------------------------  Chemistries  Recent Labs  Lab 01/14/19 1232 01/15/19 0452  NA 132* 135  K 3.1* 3.7  CL 99 106  CO2 22 23  GLUCOSE 153* 143*  BUN 24* 24*  CREATININE 0.93 0.92  CALCIUM 8.5* 8.0*  AST 23  --   ALT 16  --   ALKPHOS 47  --   BILITOT 0.7  --    ------------------------------------------------------------------------------------------------------------------  Cardiac Enzymes Recent Labs  Lab 01/15/19 0936  TROPONINI <0.03   ------------------------------------------------------------------------------------------------------------------  RADIOLOGY:  Dg Chest 2 View  Result Date: 01/14/2019 CLINICAL DATA:  Shortness of breath EXAM: CHEST - 2 VIEW COMPARISON:  Chest CT 03/29/2016 FINDINGS: The lungs are hyperinflated with diffuse coarse interstitial markings and bibasilar atelectasis. No focal consolidation or pulmonary edema. Mild cardiomegaly with calcific aortic atherosclerosis. No pleural effusion or pneumothorax. IMPRESSION: COPD with bibasilar atelectasis.  No acute findings. Electronically Signed   By: Ulyses Jarred M.D.   On: 01/14/2019 13:34    EKG:   Orders placed or performed during the hospital encounter of 01/14/19  . ED EKG  . ED EKG  . EKG 12-Lead  . EKG 12-Lead  . EKG 12-Lead  . EKG 12-Lead  . EKG 12-Lead  . EKG 12-Lead    ASSESSMENT AND PLAN:  78 year old female with past medical history of breast cancer, hypertension, hypothyroidism, osteoporosis who presents to the hospital due to shortness of breath  and cough.  1.  Acute respiratory failure with hypoxia-secondary to COPD exacerbation. - Continue O2 supplementation, will treat underlying COPD with IV steroids, scheduled duo nebs, Pulmicort nebs.  2.  COPD exacerbation-because of patient's worsening respiratory failure and hypoxia. - Chest x-ray is negative for pneumonia, this is likely secondary to ongoing tobacco abuse.  We will treat the patient with IV steroids, scheduled duo nebs, Pulmicort nebs.  Continue oxygen, patient will need to be assessed for home oxygen prior to discharge.  She may also need to be on some maintenance inhalers.  3.  chest pain -EKG no acute ST-T wave changes Troponin negative.  We will put cardiology consult if troponin trends up  4.  History of recent UTI- patient was noted to have urine cultures positive for E. coli which was pansensitive. -Completed Macrobid course  5.  History of hypothyroidism-continue Synthroid.  6.Essential hypertension-continue Norvasc, lisinopril, metoprolol.      All the records are reviewed and case discussed with Care Management/Social Workerr. Management plans discussed with the patient, sister at bedside and they are in agreement.  CODE STATUS:   TOTAL TIME TAKING CARE OF THIS PATIENT:  39 minutes.   POSSIBLE D/C IN 2  DAYS, DEPENDING ON CLINICAL CONDITION.  Note: This dictation was prepared with Dragon dictation along with smaller phrase technology. Any transcriptional errors that result from this process are unintentional.   Nicholes Mango M.D on 01/15/2019 at 3:15 PM  Between 7am to 6pm - Pager - [phone] After 6pm go to [email or web address] - password [email or web address] [facility]  Office  [phone]  CC: Primary care physician; Valerie Roys, DO

## 2019-01-15 NOTE — Progress Notes (Signed)
Note sent to MD regarding discontinuation of Macrobid. She has been on this since 01/06/19.Patient is elderly with CrlCl~39. Had UCX positive for E.Coli sensitive to amoxicillin. If patient still having urinary symptoms, it was suggested to MD to order amoxicillin. MD agreed to discontinuation of Macrobid.

## 2019-01-16 DIAGNOSIS — J9601 Acute respiratory failure with hypoxia: Secondary | ICD-10-CM | POA: Diagnosis not present

## 2019-01-16 DIAGNOSIS — Z853 Personal history of malignant neoplasm of breast: Secondary | ICD-10-CM | POA: Diagnosis not present

## 2019-01-16 DIAGNOSIS — R69 Illness, unspecified: Secondary | ICD-10-CM | POA: Diagnosis not present

## 2019-01-16 DIAGNOSIS — E871 Hypo-osmolality and hyponatremia: Secondary | ICD-10-CM | POA: Diagnosis present

## 2019-01-16 DIAGNOSIS — E876 Hypokalemia: Secondary | ICD-10-CM | POA: Diagnosis present

## 2019-01-16 DIAGNOSIS — L8990 Pressure ulcer of unspecified site, unspecified stage: Secondary | ICD-10-CM | POA: Diagnosis not present

## 2019-01-16 DIAGNOSIS — Z888 Allergy status to other drugs, medicaments and biological substances status: Secondary | ICD-10-CM | POA: Diagnosis not present

## 2019-01-16 DIAGNOSIS — E039 Hypothyroidism, unspecified: Secondary | ICD-10-CM | POA: Diagnosis not present

## 2019-01-16 DIAGNOSIS — Z923 Personal history of irradiation: Secondary | ICD-10-CM | POA: Diagnosis not present

## 2019-01-16 DIAGNOSIS — Z90710 Acquired absence of both cervix and uterus: Secondary | ICD-10-CM | POA: Diagnosis not present

## 2019-01-16 DIAGNOSIS — I10 Essential (primary) hypertension: Secondary | ICD-10-CM | POA: Diagnosis not present

## 2019-01-16 DIAGNOSIS — Z79890 Hormone replacement therapy (postmenopausal): Secondary | ICD-10-CM | POA: Diagnosis not present

## 2019-01-16 DIAGNOSIS — R0602 Shortness of breath: Secondary | ICD-10-CM | POA: Diagnosis present

## 2019-01-16 DIAGNOSIS — I2510 Atherosclerotic heart disease of native coronary artery without angina pectoris: Secondary | ICD-10-CM | POA: Diagnosis present

## 2019-01-16 DIAGNOSIS — Z79899 Other long term (current) drug therapy: Secondary | ICD-10-CM | POA: Diagnosis not present

## 2019-01-16 DIAGNOSIS — I69354 Hemiplegia and hemiparesis following cerebral infarction affecting left non-dominant side: Secondary | ICD-10-CM | POA: Diagnosis not present

## 2019-01-16 DIAGNOSIS — N390 Urinary tract infection, site not specified: Secondary | ICD-10-CM | POA: Diagnosis present

## 2019-01-16 DIAGNOSIS — Z8249 Family history of ischemic heart disease and other diseases of the circulatory system: Secondary | ICD-10-CM | POA: Diagnosis not present

## 2019-01-16 DIAGNOSIS — Z823 Family history of stroke: Secondary | ICD-10-CM | POA: Diagnosis not present

## 2019-01-16 DIAGNOSIS — J441 Chronic obstructive pulmonary disease with (acute) exacerbation: Secondary | ICD-10-CM | POA: Diagnosis not present

## 2019-01-16 DIAGNOSIS — F17210 Nicotine dependence, cigarettes, uncomplicated: Secondary | ICD-10-CM | POA: Diagnosis present

## 2019-01-16 DIAGNOSIS — M810 Age-related osteoporosis without current pathological fracture: Secondary | ICD-10-CM | POA: Diagnosis present

## 2019-01-16 DIAGNOSIS — B9620 Unspecified Escherichia coli [E. coli] as the cause of diseases classified elsewhere: Secondary | ICD-10-CM | POA: Diagnosis present

## 2019-01-16 MED ORDER — IPRATROPIUM-ALBUTEROL 0.5-2.5 (3) MG/3ML IN SOLN: 3.0000 mL | Freq: Two times a day (BID) | RESPIRATORY_TRACT | 0 refills | Status: DC

## 2019-01-16 MED ORDER — ALBUTEROL SULFATE HFA 108 (90 BASE) MCG/ACT IN AERS: 2.0000 | INHALATION_SPRAY | Freq: Four times a day (QID) | RESPIRATORY_TRACT | 2 refills | Status: DC | PRN

## 2019-01-16 MED ORDER — PREDNISONE 10 MG (21) PO TBPK: 10.0000 mg | ORAL_TABLET | Freq: Every day | ORAL | 0 refills | Status: DC

## 2019-01-16 MED ORDER — ALBUTEROL SULFATE HFA 108 (90 BASE) MCG/ACT IN AERS: 2.0000 | INHALATION_SPRAY | Freq: Four times a day (QID) | RESPIRATORY_TRACT | 1 refills | Status: DC | PRN

## 2019-01-16 MED ORDER — FLUTICASONE-SALMETEROL 250-50 MCG/DOSE IN AEPB: 1.0000 | INHALATION_SPRAY | Freq: Two times a day (BID) | RESPIRATORY_TRACT | 1 refills | Status: DC

## 2019-01-16 NOTE — Discharge Summary (Signed)
[facility] NAME: Cathy Solis    MR#:  [id number]  DATE OF BIRTH:  01-15-1941  DATE OF ADMISSION:  01/14/2019 ADMITTING PHYSICIAN: Henreitta Leber, MD  DATE OF DISCHARGE:  01/16/19   PRIMARY CARE PHYSICIAN: Valerie Roys, DO    ADMISSION DIAGNOSIS:  Hypokalemia [E87.6] Hyponatremia [E87.1] Hypoxia [R09.02] COPD exacerbation (HCC) [J44.1] Elevated brain natriuretic peptide (BNP) level [R79.89] Sepsis, due to unspecified organism, unspecified whether acute organ dysfunction present ([place]) [A41.9]  DISCHARGE DIAGNOSIS:  Active Problems:   COPD exacerbation (HCC)   Pressure injury of skin   SECONDARY DIAGNOSIS:   Past Medical History:  Diagnosis Date  . Breast cancer [facility]) 2006   Left breast, s/p radiation  . 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 Hypertension   . Kidney problem    Undeveloped R kidney  . 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 Personal history of tobacco use, presenting hazards to health 03/18/2016  . Stroke [facility])    residual left sided weakness  . Toe infection    followed by Dr. Milbert Coulter COURSE:  HPI  Cathy Solis  is a 78 y.o. female with a known history of breast cancer, hypertension, COPD, history of previous CVA who presents to the hospital due to worsening shortness of breath.  Patient says she has been feeling short of breath now progressively getting worse for the past 3 to 4 days.  She admits to a cough which is nonproductive but no fever chills nausea or vomiting.  She says that today she was so short of breath that she could not even get out of bed.  She came to the ER was noted to be in acute respiratory failure with hypoxia secondary to COPD exacerbation.  Hospitalist services were contacted for admission.  Patient denies any chest pains, nausea, vomiting, abdominal pain, sick contacts, melena, hematochezia, dysuria or any other associated  symptoms presently.   1. Acute respiratory failure with hypoxia-secondary to COPD exacerbation. Patient clinically is doing fine.  Wean off oxygen to room air -Taper IV steroids to p.o. prednisone and continue steroids nebulizer treatments at home -Outpatient follow-up with pulmonology as recommended.  Patient refused to go to [facility] pulmonology and agreeable to go to [facility] pulmonology will refer her to Dr. Humphrey Rolls   2. COPD exacerbation-because of patient's worsening respiratory failure and hypoxia. - Chest x-ray is negative for pneumonia, this is likely secondary to ongoing tobacco abuse.  -Taper IV steroids to p.o. prednisone and continue steroids nebulizer treatments at home -Stop smoking refusing nicotine patch   3. chest pain -EKG no acute ST-T wave changes Troponin negative.   Acute MI ruled out  4. History of recent UTI- patient was noted to have urine cultures positive for E. coli which was pansensitive. -Completed Macrobid course  5. History of hypothyroidism-continue Synthroid.  6.Essential hypertension-continue Norvasc, lisinopril, metoprolol.  Discharge home, patient did not qualify for oxygen per RN  Plan of care discussed in detail with the patient and her son at bedside  DISCHARGE CONDITIONS:   fair  CONSULTS OBTAINED:     PROCEDURES  None   DRUG ALLERGIES:   Allergies  Allergen Reactions  . Statins Other (See Comments)    Myalgias  . Zetia [Ezetimibe] Other (See Comments)    myalgias    DISCHARGE MEDICATIONS:   Allergies as of 01/16/2019      Reactions  Statins Other (See Comments)   Myalgias   Zetia [ezetimibe] Other (See Comments)   myalgias      Medication List    STOP taking these medications   nitrofurantoin (macrocrystal-monohydrate) 100 MG capsule Commonly known as:  MACROBID   UNABLE TO FIND     TAKE these medications   albuterol 108 (90 Base) MCG/ACT inhaler Commonly known as:  PROVENTIL HFA;VENTOLIN HFA Inhale 2  puffs into the lungs every 6 (six) hours as needed for wheezing or shortness of breath.   amLODipine 10 MG tablet Commonly known as:  NORVASC Take 1 tablet (10 mg total) by mouth daily.   denosumab 60 MG/ML Sosy injection Commonly known as:  PROLIA Inject 60 mg into the skin once for 1 dose.   fexofenadine 180 MG tablet Commonly known as:  ALLEGRA ALLERGY Take 1 tablet (180 mg total) by mouth daily.   Fluticasone-Salmeterol 250-50 MCG/DOSE Aepb Commonly known as:  ADVAIR DISKUS Inhale 1 puff into the lungs 2 (two) times daily. Start taking on:  January 21, 2019   ipratropium-albuterol 0.5-2.5 (3) MG/3ML Soln Commonly known as:  DUONEB Take 3 mLs by nebulization 2 (two) times daily for 4 days. DuoNeb nebulizer treatments twice a day for 4 days followed by as needed for severe shortness of breath   levothyroxine 175 MCG tablet Commonly known as:  SYNTHROID, LEVOTHROID TAKE 1 TABLET BY MOUTH EVERY DAY   lisinopril 5 MG tablet Commonly known as:  PRINIVIL,ZESTRIL TAKE 1 TABLET BY MOUTH EVERY DAY   metoprolol tartrate 25 MG tablet Commonly known as:  LOPRESSOR Take 1 tablet (25 mg total) by mouth 2 (two) times daily.   predniSONE 10 MG (21) Tbpk tablet Commonly known as:  STERAPRED UNI-PAK 21 TAB Take 1 tablet (10 mg total) by mouth daily. Take 6 tablets by mouth for 1 day followed by  5 tablets by mouth for 1 day followed by  4 tablets by mouth for 1 day followed by  3 tablets by mouth for 1 day followed by  2 tablets by mouth for 1 day followed by  1 tablet by mouth for a day and stop   Vitamin D (Cholecalciferol) 10 MCG (400 UNIT) Tabs Take by mouth 2 (two) times daily.        DISCHARGE INSTRUCTIONS:   Follow-up with primary care physician in 3 days Follow-up with pulmonology Dr. Humphrey Rolls in 10 days  DIET:  Low-salt  DISCHARGE CONDITION:  Fair  ACTIVITY:  Activity as tolerated  OXYGEN:  Home Oxygen: No.   Oxygen Delivery: room air  DISCHARGE LOCATION:   home   If you experience worsening of your admission symptoms, develop shortness of breath, life threatening emergency, suicidal or homicidal thoughts you must seek medical attention immediately by calling 911 or calling your MD immediately  if symptoms less severe.  You Must read complete instructions/literature along with all the possible adverse reactions/side effects for all the Medicines you take and that have been prescribed to you. Take any new Medicines after you have completely understood and accpet all the possible adverse reactions/side effects.   Please note  You were cared for by a hospitalist during your hospital stay. If you have any questions about your discharge medications or the care you received while you were in the hospital after you are discharged, you can call the unit and asked to speak with the hospitalist on call if the hospitalist that took care of you is not available. Once you are discharged, your  primary care physician will handle any further medical issues. Please note that NO REFILLS for any discharge medications will be authorized once you are discharged, as it is imperative that you return to your primary care physician (or establish a relationship with a primary care physician if you do not have one) for your aftercare needs so that they can reassess your need for medications and monitor your lab values.     Today  Chief Complaint  Patient presents with  . Respiratory Distress  . Shortness of Breath   Patient is feeling much better.  Weaned off to room air.  Patient has nebulizer machine at home.  Counseled patient to quit smoking   ROS:  CONSTITUTIONAL: Denies fevers, chills. Denies any fatigue, weakness.  EYES: Denies blurry vision, double vision, eye pain. EARS, NOSE, THROAT: Denies tinnitus, ear pain, hearing loss. RESPIRATORY: Reports chronic cough, denies wheeze, shortness of breath.  CARDIOVASCULAR: Denies chest pain, palpitations, edema.   GASTROINTESTINAL: Denies nausea, vomiting, diarrhea, abdominal pain. Denies bright red blood per rectum. GENITOURINARY: Denies dysuria, hematuria. ENDOCRINE: Denies nocturia or thyroid problems. HEMATOLOGIC AND LYMPHATIC: Denies easy bruising or bleeding. SKIN: Denies rash or lesion. MUSCULOSKELETAL: Denies pain in neck, back, shoulder, knees, hips or arthritic symptoms.  NEUROLOGIC: Denies paralysis, paresthesias.  PSYCHIATRIC: Denies anxiety or depressive symptoms.   VITAL SIGNS:  Blood pressure 124/78, pulse (!) 54, temperature 98.5 F (36.9 C), temperature source Oral, resp. rate 16, height 5\' 4"  (1.626 m), weight 51.3 kg, SpO2 91 %.  I/O:    Intake/Output Summary (Last 24 hours) at 01/16/2019 1519 Last data filed at 01/16/2019 0900 Gross per 24 hour  Intake 480 ml  Output -  Net 480 ml    PHYSICAL EXAMINATION:  GENERAL:  78 y.o.-year-old patient lying in the bed with no acute distress.  EYES: Pupils equal, round, reactive to light and accommodation. No scleral icterus. Extraocular muscles intact.  HEENT: Head atraumatic, normocephalic. Oropharynx and nasopharynx clear.  NECK:  Supple, no jugular venous distention. No thyroid enlargement, no tenderness.  LUNGS: Normal breath sounds bilaterally, no wheezing, rales,rhonchi or crepitation. No use of accessory muscles of respiration.  CARDIOVASCULAR: S1, S2 normal. No murmurs, rubs, or gallops.  ABDOMEN: Soft, non-tender, non-distended. Bowel sounds present.  EXTREMITIES: No pedal edema, cyanosis, or clubbing.  NEUROLOGIC: Awake, alert and oriented x3. Sensation intact. Gait not checked.  PSYCHIATRIC: The patient is alert and oriented x 3.  SKIN: No obvious rash, lesion, or ulcer.   DATA REVIEW:   CBC Recent Labs  Lab 01/14/19 1232  WBC 12.3*  HGB 13.8  HCT 40.5  PLT 261    Chemistries  Recent Labs  Lab 01/14/19 1232 01/15/19 0452  NA 132* 135  K 3.1* 3.7  CL 99 106  CO2 22 23  GLUCOSE 153* 143*  BUN 24*  24*  CREATININE 0.93 0.92  CALCIUM 8.5* 8.0*  AST 23  --   ALT 16  --   ALKPHOS 47  --   BILITOT 0.7  --     Cardiac Enzymes Recent Labs  Lab 01/15/19 2130  TROPONINI <0.03    Microbiology Results  Results for orders placed or performed during the hospital encounter of 01/14/19  Blood culture (routine x 2)     Status: None (Preliminary result)   Collection Time: 01/14/19  4:41 PM  Result Value Ref Range Status   Specimen Description BLOOD BLOOD RIGHT FOREARM  Final   Special Requests   Final    BOTTLES DRAWN  AEROBIC AND ANAEROBIC Blood Culture results may not be optimal due to an excessive volume of blood received in culture bottles   Culture   Final    NO GROWTH 2 DAYS Performed at [facility], [address] [place], [place] [address]    Report Status PENDING  Incomplete  Blood culture (routine x 2)     Status: None (Preliminary result)   Collection Time: 01/14/19  4:41 PM  Result Value Ref Range Status   Specimen Description BLOOD BLOOD LEFT FOREARM  Final   Special Requests   Final    BOTTLES DRAWN AEROBIC AND ANAEROBIC Blood Culture results may not be optimal due to an excessive volume of blood received in culture bottles   Culture   Final    NO GROWTH 2 DAYS Performed at [facility], [address] [place], [place] [address]    Report Status PENDING  Incomplete    RADIOLOGY:  Dg Chest 2 View  Result Date: 01/14/2019 CLINICAL DATA:  Shortness of breath EXAM: CHEST - 2 VIEW COMPARISON:  Chest CT 03/29/2016 FINDINGS: The lungs are hyperinflated with diffuse coarse interstitial markings and bibasilar atelectasis. No focal consolidation or pulmonary edema. Mild cardiomegaly with calcific aortic atherosclerosis. No pleural effusion or pneumothorax. IMPRESSION: COPD with bibasilar atelectasis.  No acute findings. Electronically Signed   By: Ulyses Jarred M.D.   On: 01/14/2019 13:34    EKG:   Orders placed or performed during the hospital  encounter of 01/14/19  . ED EKG  . ED EKG  . EKG 12-Lead  . EKG 12-Lead  . EKG 12-Lead  . EKG 12-Lead  . EKG 12-Lead  . EKG 12-Lead  . EKG      Management plans discussed with the patient, family and they are in agreement.  CODE STATUS:     Code Status Orders  (From admission, onward)         Start     Ordered   01/14/19 2039  Full code  Continuous     01/14/19 2038        Code Status History    This patient has a current code status but no historical code status.    Advance Directive Documentation     Most Recent Value  Type of Advance Directive  Healthcare Power of Attorney  Pre-existing out of facility DNR order (yellow form or pink MOST form)  -  "MOST" Form in Place?  -      TOTAL TIME TAKING CARE OF THIS PATIENT:  43  minutes.   Note: This dictation was prepared with Dragon dictation along with smaller phrase technology. Any transcriptional errors that result from this process are unintentional.   @[facility] @  on 01/16/2019 at 3:19 PM  Between 7am to 6pm - Pager - [phone]  After 6pm go to [email or web address] - password [email or web address] [facility]  Office  [phone]  CC: Primary care physician; Valerie Roys, DO

## 2019-01-16 NOTE — Discharge Instructions (Signed)
Follow-up with primary care physician in 3 days Follow-up with pulmonology Dr. Humphrey Rolls in 10 days

## 2019-01-16 NOTE — Progress Notes (Signed)
SATURATION QUALIFICATIONS: (This note is used to comply with regulatory documentation for home oxygen)  Patient Saturations on Room Air at Rest = 96%  Patient Saturations on Room Air while Ambulating = 90  Patient Saturations on 0 Liters of oxygen while Ambulating = 89%  Please briefly explain why patient needs home oxygen: Patient currently already has 2 condensing units for prn use in the home.

## 2019-01-17 ENCOUNTER — Telehealth: Payer: Self-pay

## 2019-01-17 NOTE — Telephone Encounter (Signed)
Transition Care Management Follow-up Telephone Call  Date of discharge and from where: 01/16/2019 at [facility]  How have you been since you were released from the hospital? "just fine"  Any questions or concerns? No   Items Reviewed:  Did the pt receive and understand the discharge instructions provided? Yes   Medications obtained and verified? No   Any new allergies since your discharge? No   Dietary orders reviewed? Yes  Do you have support at home? Yes   Functional Questionnaire: (I = Independent and D = Dependent) ADLs:   Bathing/Dressing- I  Meal Prep- I  Eating- I  Maintaining continence- I  Transferring/Ambulation- I  Managing Meds- I  Follow up appointments reviewed:   PCP Hospital f/u appt confirmed? Yes  Scheduled to see Dr.Johnson on 01/24/2019 @ 10:00am.  [facility] f/u appt confirmed? Yes  Scheduled to see Dr.Khan on 01/22/2019 @ 2pm.  Are transportation arrangements needed? No   If their condition worsens, is the pt aware to call PCP or go to the Emergency Dept.? Yes  Was the patient provided with contact information for the PCP's office or ED? Yes  Was to pt encouraged to call back with questions or concerns? Yes

## 2019-01-19 LAB — CULTURE, BLOOD (ROUTINE X 2)
Culture: NO GROWTH
Culture: NO GROWTH

## 2019-01-22 ENCOUNTER — Encounter: Payer: Self-pay | Admitting: Internal Medicine

## 2019-01-22 ENCOUNTER — Ambulatory Visit: Payer: Medicare HMO | Admitting: Internal Medicine

## 2019-01-22 VITALS — BP 112/76 | HR 78 | Resp 16 | Ht 62.0 in | Wt 115.0 lb

## 2019-01-22 DIAGNOSIS — E782 Mixed hyperlipidemia: Secondary | ICD-10-CM | POA: Diagnosis not present

## 2019-01-22 DIAGNOSIS — Z87891 Personal history of nicotine dependence: Secondary | ICD-10-CM

## 2019-01-22 DIAGNOSIS — J441 Chronic obstructive pulmonary disease with (acute) exacerbation: Secondary | ICD-10-CM

## 2019-01-22 NOTE — Patient Instructions (Signed)
Chronic Obstructive Pulmonary Disease Chronic obstructive pulmonary disease (COPD) is a long-term (chronic) lung problem. When you have COPD, it is hard for air to get in and out of your lungs. Usually the condition gets worse over time, and your lungs will never return to normal. There are things you can do to keep yourself as healthy as possible.  Your doctor may treat your condition with: ? Medicines. ? Oxygen. ? Lung surgery.  Your doctor may also recommend: ? Rehabilitation. This includes steps to make your body work better. It may involve a team of specialists. ? Quitting smoking, if you smoke. ? Exercise and changes to your diet. ? Comfort measures (palliative care). Follow these instructions at home: Medicines  Take over-the-counter and prescription medicines only as told by your doctor.  Talk to your doctor before taking any cough or allergy medicines. You may need to avoid medicines that cause your lungs to be dry. Lifestyle  If you smoke, stop. Smoking makes the problem worse. If you need help quitting, ask your doctor.  Avoid being around things that make your breathing worse. This may include smoke, chemicals, and fumes.  Stay active, but remember to rest as well.  Learn and use tips on how to relax.  Make sure you get enough sleep. Most adults need at least 7 hours of sleep every night.  Eat healthy foods. Eat smaller meals more often. Rest before meals. Controlled breathing Learn and use tips on how to control your breathing as told by your doctor. Try:  Breathing in (inhaling) through your nose for 1 second. Then, pucker your lips and breath out (exhale) through your lips for 2 seconds.  Putting one hand on your belly (abdomen). Breathe in slowly through your nose for 1 second. Your hand on your belly should move out. Pucker your lips and breathe out slowly through your lips. Your hand on your belly should move in as you breathe out.  Controlled coughing Learn  and use controlled coughing to clear mucus from your lungs. Follow these steps: 1. Lean your head a little forward. 2. Breathe in deeply. 3. Try to hold your breath for 3 seconds. 4. Keep your mouth slightly open while coughing 2 times. 5. Spit any mucus out into a tissue. 6. Rest and do the steps again 1 or 2 times as needed. General instructions  Make sure you get all the shots (vaccines) that your doctor recommends. Ask your doctor about a flu shot and a pneumonia shot.  Use oxygen therapy and pulmonary rehabilitation if told by your doctor. If you need home oxygen therapy, ask your doctor if you should buy a tool to measure your oxygen level (oximeter).  Make a COPD action plan with your doctor. This helps you to know what to do if you feel worse than usual.  Manage any other conditions you have as told by your doctor.  Avoid going outside when it is very hot, cold, or humid.  Avoid people who have a sickness you can catch (contagious).  Keep all follow-up visits as told by your doctor. This is important. Contact a doctor if:  You cough up more mucus than usual.  There is a change in the color or thickness of the mucus.  It is harder to breathe than usual.  Your breathing is faster than usual.  You have trouble sleeping.  You need to use your medicines more often than usual.  You have trouble doing your normal activities such as getting dressed   or walking around the house. Get help right away if:  You have shortness of breath while resting.  You have shortness of breath that stops you from: ? Being able to talk. ? Doing normal activities.  Your chest hurts for longer than 5 minutes.  Your skin color is more blue than usual.  Your pulse oximeter shows that you have low oxygen for longer than 5 minutes.  You have a fever.  You feel too tired to breathe normally. Summary  Chronic obstructive pulmonary disease (COPD) is a long-term lung problem.  The way your  lungs work will never return to normal. Usually the condition gets worse over time. There are things you can do to keep yourself as healthy as possible.  Take over-the-counter and prescription medicines only as told by your doctor.  If you smoke, stop. Smoking makes the problem worse. This information is not intended to replace advice given to you by your health care provider. Make sure you discuss any questions you have with your health care provider. Document Released: 04/25/2008 Document Revised: 12/12/2016 Document Reviewed: 12/12/2016 Elsevier Interactive Patient Education  2019 [employer]  

## 2019-01-22 NOTE — Progress Notes (Signed)
[facility] [place], [place] [address]  Pulmonary Sleep Medicine   Office Visit Note  Patient Name: Cathy Solis DOB: July 23, 1941 MRN [id number]  Date of Service: 01/22/2019  Complaints/HPI: Pt reports she was recently referred to our practice after being hospitalized at [facility].  Pt does not know why she is here.  She reports she had an episode of not being able to breath, called her family doctor who sent her to the ED.  In the ED she was found to be hypoxic and acute respiratory failure and copd exacerbation. She reports she was on oxygen while int he hopsital however, has not had any since.  She started using the inhalers, but they are making her cough, so she is not interested in using them.  She quit smoking 2 weeks ago, she was smoking 1PPD x 60 years.     ROS  General: (-) fever, (-) chills, (-) night sweats, (-) weakness Skin: (-) rashes, (-) itching,. Eyes: (-) visual changes, (-) redness, (-) itching. Nose and Sinuses: (-) nasal stuffiness or itchiness, (-) postnasal drip, (-) nosebleeds, (-) sinus trouble. Mouth and Throat: (-) sore throat, (-) hoarseness. Neck: (-) swollen glands, (-) enlarged thyroid, (-) neck pain. Respiratory: + cough, (-) bloody sputum, - shortness of breath, - wheezing. Cardiovascular: - ankle swelling, (-) chest pain. Lymphatic: (-) lymph node enlargement. Neurologic: (-) numbness, (-) tingling. Psychiatric: (-) anxiety, (-) depression   Current Medication: Outpatient Encounter Medications as of 01/22/2019  Medication Sig  . albuterol (PROVENTIL HFA;VENTOLIN HFA) 108 (90 Base) MCG/ACT inhaler Inhale 2 puffs into the lungs every 6 (six) hours as needed for wheezing or shortness of breath.  [name] amLODipine (NORVASC) 10 MG tablet Take 1 tablet (10 mg total) by mouth daily.  [name] denosumab (PROLIA) 60 MG/ML SOSY injection Inject 60 mg into the skin once for 1 dose.  . fexofenadine (ALLEGRA ALLERGY) 180 MG tablet Take 1 tablet (180 mg  total) by mouth daily.  . Fluticasone-Salmeterol (ADVAIR DISKUS) 250-50 MCG/DOSE AEPB Inhale 1 puff into the lungs 2 (two) times daily.  [name] levothyroxine (SYNTHROID, LEVOTHROID) 175 MCG tablet TAKE 1 TABLET BY MOUTH EVERY DAY  . lisinopril (PRINIVIL,ZESTRIL) 5 MG tablet TAKE 1 TABLET BY MOUTH EVERY DAY  . metoprolol tartrate (LOPRESSOR) 25 MG tablet Take 1 tablet (25 mg total) by mouth 2 (two) times daily.  . predniSONE (STERAPRED UNI-PAK 21 TAB) 10 MG (21) TBPK tablet Take 1 tablet (10 mg total) by mouth daily. Take 6 tablets by mouth for 1 day followed by  5 tablets by mouth for 1 day followed by  4 tablets by mouth for 1 day followed by  3 tablets by mouth for 1 day followed by  2 tablets by mouth for 1 day followed by  1 tablet by mouth for a day and stop  . Vitamin D, Cholecalciferol, 400 units TABS Take by mouth 2 (two) times daily.  [name] ipratropium-albuterol (DUONEB) 0.5-2.5 (3) MG/3ML SOLN Take 3 mLs by nebulization 2 (two) times daily for 4 days. DuoNeb nebulizer treatments twice a day for 4 days followed by as needed for severe shortness of breath   No facility-administered encounter medications on file as of 01/22/2019.     Surgical History: Past Surgical History:  Procedure Laterality Date  . ABDOMINAL HYSTERECTOMY    . BLADDER REPAIR    . BREAST LUMPECTOMY Left   . BREAST SURGERY     left  . CAROTID ENDARTERECTOMY  2008  left  . COLONOSCOPY  2008  . EYE SURGERY Right 2013   cataract  . KIDNEY SURGERY  1949    Medical History: Past Medical History:  Diagnosis Date  . Breast cancer [facility]) 2006   Left breast, s/p radiation  . 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 Hypertension   . Kidney problem    Undeveloped R kidney  . 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 Personal history of tobacco use, presenting hazards to health 03/18/2016  . Stroke [facility])    residual left sided weakness  . Toe infection    followed by Dr. Jens Som     Family History: Family History  Problem Relation Age of Onset  . Stroke Mother   . Heart disease Father   . Heart attack Maternal Grandfather     Social History: Social History   Socioeconomic History  . Marital status: Widowed    Spouse name: Not on file  . Number of children: Not on file  . Years of education: Not on file  . Highest education level: High school graduate  Occupational History  . Not on file  Social Needs  . Financial resource strain: Not hard at all  . Food insecurity:    Worry: Never true    Inability: Never true  . Transportation needs:    Medical: No    Non-medical: No  Tobacco Use  . Smoking status: Former Smoker    Packs/day: 1.00    Years: 30.00    Pack years: 30.00    Types: Cigarettes    Last attempt to quit: 01/08/2019    Years since quitting: 0.0  . Smokeless tobacco: Never Used  Substance and Sexual Activity  . Alcohol use: No  . Drug use: No  . Sexual activity: Never  Lifestyle  . Physical activity:    Days per week: 0 days    Minutes per session: 0 min  . Stress: Not at all  Relationships  . Social connections:    Talks on phone: More than three times a week    Gets together: More than three times a week    Attends religious service: More than 4 times per year    Active member of club or organization: No    Attends meetings of clubs or organizations: Never    Relationship status: Widowed  . Intimate partner violence:    Fear of current or ex partner: No    Emotionally abused: No    Physically abused: No    Forced sexual activity: No  Other Topics Concern  . Not on file  Social History Narrative  . Not on file    Vital Signs: Blood pressure 112/76, pulse 78, resp. rate 16, height 5\' 2"  (1.575 m), weight 115 lb (52.2 kg), SpO2 98 %.  Examination: General Appearance: The patient is well-developed, well-nourished, and in no distress. Skin: Gross inspection of skin unremarkable. Head: normocephalic, no gross  deformities. Eyes: no gross deformities noted. ENT: ears appear grossly normal no exudates. Neck: Supple. No thyromegaly. No LAD. Respiratory: clear bilateraly, slight rub noted in left lower lobe. Cardiovascular: Normal S1 and S2 without murmur or rub. Extremities: No cyanosis. pulses are equal. Neurologic: Alert and oriented. No involuntary movements.  LABS: Recent Results (from the past 2160 hour(s))  Microalbumin, Urine Waived     Status: Abnormal   Collection Time: 01/04/19  9:28 AM  Result Value Ref Range   Microalb, Ur Waived  150 (H) 0 - 19 mg/L   Creatinine, Urine Waived 50 10 - 300 mg/dL   Microalb/Creat Ratio >300 (H) <30 mg/g    Comment:                              Abnormal:       30 - 300                         High Abnormal:           >300   UA/M w/rflx Culture, Routine     Status: Abnormal   Collection Time: 01/04/19  9:28 AM  Result Value Ref Range   Specific Gravity, UA 1.020 1.005 - 1.030   pH, UA 6.5 5.0 - 7.5   Color, UA Yellow Yellow   Appearance Ur Cloudy (A) Clear   Leukocytes, UA 1+ (A) Negative   Protein, UA 3+ (A) Negative/Trace   Glucose, UA Negative Negative   Ketones, UA Negative Negative   RBC, UA Trace (A) Negative   Bilirubin, UA Negative Negative   Urobilinogen, Ur 0.2 0.2 - 1.0 mg/dL   Nitrite, UA Negative Negative   Microscopic Examination See below:    Urinalysis Reflex Comment     Comment: This specimen has reflexed to a Urine Culture.  Microscopic Examination     Status: Abnormal   Collection Time: 01/04/19  9:28 AM  Result Value Ref Range   WBC, UA >30 (A) 0 - 5 /hpf   RBC, UA 0-2 0 - 2 /hpf   Epithelial Cells (non renal) 0-10 0 - 10 /hpf   Bacteria, UA Many (A) None seen/Few  Urine Culture, Reflex     Status: Abnormal   Collection Time: 01/04/19  9:28 AM  Result Value Ref Range   Urine Culture, Routine Final report (A)    Organism ID, Bacteria Escherichia coli (A)     Comment: Greater than 100,000 colony forming units per  mL Cefazolin <=4 ug/mL Cefazolin with an MIC <=16 predicts susceptibility to the oral agents cefaclor, cefdinir, cefpodoxime, cefprozil, cefuroxime, cephalexin, and loracarbef when used for therapy of uncomplicated urinary tract infections due to E. coli, Klebsiella pneumoniae, and Proteus mirabilis.    Antimicrobial Susceptibility Comment     Comment:       ** S = Susceptible; I = Intermediate; R = Resistant **                    P = Positive; N = Negative             MICS are expressed in micrograms per mL    Antibiotic                 RSLT#1    RSLT#2    RSLT#3    RSLT#4 Amoxicillin/Clavulanic Acid    S Ampicillin                     S Cefepime                       S Ceftriaxone                    S Cefuroxime                     S Ciprofloxacin  S Ertapenem                      S Gentamicin                     S Imipenem                       S Levofloxacin                   S Meropenem                      S Nitrofurantoin                 S Piperacillin/Tazobactam        S Tetracycline                   S Tobramycin                     S Trimethoprim/Sulfa             S   CBC with Differential/Platelet     Status: None   Collection Time: 01/04/19  9:30 AM  Result Value Ref Range   WBC 6.6 3.4 - 10.8 x10E3/uL   RBC 4.22 3.77 - 5.28 x10E6/uL   Hemoglobin 13.3 11.1 - 15.9 g/dL   Hematocrit 39.3 34.0 - 46.6 %   MCV 93 79 - 97 fL   MCH 31.5 26.6 - 33.0 pg   MCHC 33.8 31.5 - 35.7 g/dL   RDW 12.5 11.7 - 15.4 %   Platelets 247 150 - 450 x10E3/uL   Neutrophils 67 Not Estab. %   Lymphs 20 Not Estab. %   Monocytes 10 Not Estab. %   Eos 2 Not Estab. %   Basos 1 Not Estab. %   Neutrophils Absolute 4.5 1.4 - 7.0 x10E3/uL   Lymphocytes Absolute 1.3 0.7 - 3.1 x10E3/uL   Monocytes Absolute 0.7 0.1 - 0.9 x10E3/uL   EOS (ABSOLUTE) 0.1 0.0 - 0.4 x10E3/uL   Basophils Absolute 0.0 0.0 - 0.2 x10E3/uL   Immature Granulocytes 0 Not Estab. %   Immature Grans (Abs) 0.0  0.0 - 0.1 x10E3/uL  Comprehensive metabolic panel     Status: Abnormal   Collection Time: 01/04/19  9:30 AM  Result Value Ref Range   Glucose 68 65 - 99 mg/dL   BUN 23 8 - 27 mg/dL   Creatinine, Ser 1.23 (H) 0.57 - 1.00 mg/dL   GFR calc non Af Amer 42 (L) >59 mL/min/1.73   GFR calc Af Amer 48 (L) >59 mL/min/1.73   BUN/Creatinine Ratio 19 12 - 28   Sodium 137 134 - 144 mmol/L   Potassium 4.0 3.5 - 5.2 mmol/L   Chloride 97 96 - 106 mmol/L   CO2 26 20 - 29 mmol/L   Calcium 9.5 8.7 - 10.3 mg/dL   Total Protein 6.3 6.0 - 8.5 g/dL   Albumin 3.7 3.7 - 4.7 g/dL    Comment:               **Please note reference interval change**   Globulin, Total 2.6 1.5 - 4.5 g/dL   Albumin/Globulin Ratio 1.4 1.2 - 2.2   Bilirubin Total 0.2 0.0 - 1.2 mg/dL   Alkaline Phosphatase 53 39 - 117 IU/L   AST 20 0 - 40 IU/L   ALT 13 0 - 32 IU/L  Lipid Panel w/o Chol/HDL Ratio     Status: Abnormal   Collection Time: 01/04/19  9:30 AM  Result Value Ref Range   Cholesterol, Total 212 (H) 100 - 199 mg/dL   Triglycerides 94 0 - 149 mg/dL   HDL 80 >39 mg/dL   VLDL Cholesterol Cal 19 5 - 40 mg/dL   LDL Calculated 113 (H) 0 - 99 mg/dL  TSH     Status: None   Collection Time: 01/04/19  9:30 AM  Result Value Ref Range   TSH 2.950 0.450 - 4.500 uIU/mL  CBC     Status: Abnormal   Collection Time: 01/14/19 12:32 PM  Result Value Ref Range   WBC 12.3 (H) 4.0 - 10.5 K/uL   RBC 4.42 3.87 - 5.11 MIL/uL   Hemoglobin 13.8 12.0 - 15.0 g/dL   HCT 40.5 36.0 - 46.0 %   MCV 91.6 80.0 - 100.0 fL   MCH 31.2 26.0 - 34.0 pg   MCHC 34.1 30.0 - 36.0 g/dL   RDW 13.3 11.5 - 15.5 %   Platelets 261 150 - 400 K/uL   nRBC 0.0 0.0 - 0.2 %    Comment: Performed at [facility], [address] [place], [place] [address]  Comprehensive metabolic panel     Status: Abnormal   Collection Time: 01/14/19 12:32 PM  Result Value Ref Range   Sodium 132 (L) 135 - 145 mmol/L   Potassium 3.1 (L) 3.5 - 5.1 mmol/L   Chloride 99 98 -  111 mmol/L   CO2 22 22 - 32 mmol/L   Glucose, Bld 153 (H) 70 - 99 mg/dL   BUN 24 (H) 8 - 23 mg/dL   Creatinine, Ser 0.93 0.44 - 1.00 mg/dL   Calcium 8.5 (L) 8.9 - 10.3 mg/dL   Total Protein 7.5 6.5 - 8.1 g/dL   Albumin 3.7 3.5 - 5.0 g/dL   AST 23 15 - 41 U/L   ALT 16 0 - 44 U/L   Alkaline Phosphatase 47 38 - 126 U/L   Total Bilirubin 0.7 0.3 - 1.2 mg/dL   GFR calc non Af Amer 58 (L) >60 mL/min   GFR calc Af Amer >60 >60 mL/min   Anion gap 11 5 - 15    Comment: Performed at [facility], [address] [place], [place] [address]  Brain natriuretic peptide     Status: Abnormal   Collection Time: 01/14/19 12:32 PM  Result Value Ref Range   B Natriuretic Peptide 300.0 (H) 0.0 - 100.0 pg/mL    Comment: Performed at [facility], [address] [place], [place] [address]  Troponin I - ONCE - STAT     Status: None   Collection Time: 01/14/19 12:32 PM  Result Value Ref Range   Troponin I <0.03 <0.03 ng/mL    Comment: Performed at [facility], [address] [place], [place] [address]  Influenza panel by PCR (type A & B)     Status: None   Collection Time: 01/14/19 12:32 PM  Result Value Ref Range   Influenza A By PCR NEGATIVE NEGATIVE   Influenza B By PCR NEGATIVE NEGATIVE    Comment: (NOTE) The Xpert Xpress Flu assay is intended as an aid in the diagnosis of  influenza and should not be used as a sole basis for treatment.  This  assay is FDA approved for nasopharyngeal swab specimens only. Nasal  washings and aspirates are unacceptable for Xpert Xpress Flu testing. Performed  at [facility], [address] [place], [place] [address]   Blood culture (routine x 2)     Status: None   Collection Time: 01/14/19  4:41 PM  Result Value Ref Range   Specimen Description BLOOD BLOOD RIGHT FOREARM    Special Requests      BOTTLES DRAWN AEROBIC AND ANAEROBIC Blood Culture results may not be optimal due to an excessive volume of blood received in  culture bottles   Culture      NO GROWTH 5 DAYS Performed at [facility], [address], [place], [place] [address]    Report Status 01/19/2019 FINAL   Blood culture (routine x 2)     Status: None   Collection Time: 01/14/19  4:41 PM  Result Value Ref Range   Specimen Description BLOOD BLOOD LEFT FOREARM    Special Requests      BOTTLES DRAWN AEROBIC AND ANAEROBIC Blood Culture results may not be optimal due to an excessive volume of blood received in culture bottles   Culture      NO GROWTH 5 DAYS Performed at [facility], [address] [place], [place] [address]    Report Status 01/19/2019 FINAL   Basic metabolic panel     Status: Abnormal   Collection Time: 01/15/19  4:52 AM  Result Value Ref Range   Sodium 135 135 - 145 mmol/L   Potassium 3.7 3.5 - 5.1 mmol/L   Chloride 106 98 - 111 mmol/L   CO2 23 22 - 32 mmol/L   Glucose, Bld 143 (H) 70 - 99 mg/dL   BUN 24 (H) 8 - 23 mg/dL   Creatinine, Ser 0.92 0.44 - 1.00 mg/dL   Calcium 8.0 (L) 8.9 - 10.3 mg/dL   GFR calc non Af Amer 59 (L) >60 mL/min   GFR calc Af Amer >60 >60 mL/min   Anion gap 6 5 - 15    Comment: Performed at [facility], [address] [place], [place] [address]  Troponin I - Now Then Q6H     Status: None   Collection Time: 01/15/19  9:36 AM  Result Value Ref Range   Troponin I <0.03 <0.03 ng/mL    Comment: Performed at [facility], [address] [place], [place] [address]  Troponin I - Now Then Q6H     Status: Abnormal   Collection Time: 01/15/19  3:19 PM  Result Value Ref Range   Troponin I 0.03 (HH) <0.03 ng/mL    Comment: CRITICAL RESULT CALLED TO, READ BACK BY AND VERIFIED WITH JENNIFER COLE @ 4742 ON 01/15/19 BY JUW Performed at [facility], [address] [place], [place] [address]   Troponin I - Now Then Q6H     Status: None   Collection Time: 01/15/19  9:30 PM  Result Value Ref Range   Troponin I <0.03 <0.03 ng/mL    Comment: Performed  at [facility], [address] [place], [place] [address]    Radiology: Dg Chest 2 View  Result Date: 01/14/2019 CLINICAL DATA:  Shortness of breath EXAM: CHEST - 2 VIEW COMPARISON:  Chest CT 03/29/2016 FINDINGS: The lungs are hyperinflated with diffuse coarse interstitial markings and bibasilar atelectasis. No focal consolidation or pulmonary edema. Mild cardiomegaly with calcific aortic atherosclerosis. No pleural effusion or pneumothorax. IMPRESSION: COPD with bibasilar atelectasis.  No acute findings. Electronically Signed   By: Ulyses Jarred M.D.   On: 01/14/2019 13:34    No results found.  Dg Chest 2 View  Result Date: 01/14/2019 CLINICAL DATA:  Shortness of breath EXAM: CHEST - 2 VIEW COMPARISON:  Chest CT 03/29/2016 FINDINGS: The lungs are hyperinflated with diffuse coarse interstitial markings and bibasilar atelectasis. No focal consolidation or pulmonary edema. Mild cardiomegaly with calcific aortic atherosclerosis. No pleural effusion or pneumothorax. IMPRESSION: COPD with bibasilar atelectasis.  No acute findings. Electronically Signed   By: Ulyses Jarred M.D.   On: 01/14/2019 13:34      Assessment and Plan: Patient Active Problem List   Diagnosis Date Noted  . Pressure injury of skin 01/15/2019  . COPD exacerbation ([place]) 01/14/2019  . Osteoporosis 11/02/2017  . Coronary atherosclerosis 05/23/2016  . Personal history of tobacco use, presenting hazards to health 03/18/2016  . Idiopathic scoliosis 11/28/2014  . Tobacco abuse 11/28/2014  . 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 Hydronephrosis 02/04/2014  . 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 Carotid stenosis 05/23/2013  . Pernicious anemia 06/26/2012  . Hyperlipidemia 12/29/2011  . 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 Hypothyroidism 08/04/2011   1. COPD exacerbation ([place]) Patient declined any testing at this time and she does not wish to continue to  take her inhalers.  We discussed compliance with inhalers and improved lung function however she was not really interested at this time.  We did settle on her coming back for follow-up in 3 months.  She reports that if she is still feeling the same in 3 months she would choose not to continue her care here as that she feels like her old self again.  2. Personal history of tobacco use, presenting hazards to health Patient reports a 1 pack/day 60-year smoking history.  She quit 2 weeks ago. Smoking cessation counseling: 1. Pt acknowledges the risks of long term smoking, she will try to quite smoking. 2. Options for different medications including nicotine products, chewing gum, patch etc, Wellbutrin and Chantix is discussed 3. Goal and date of compete cessation is discussed 4. Total time spent in smoking cessation is 15 min.   3. Mixed hyperlipidemia Follow-up with PCP for continued monitoring.   General Counseling: I have discussed the findings of the evaluation and examination with [facility].  I have also discussed any further diagnostic evaluation thatmay be needed or ordered today. Shareka verbalizes understanding of the findings of todays visit. We also reviewed her medications today and discussed drug interactions and side effects including but not limited excessive drowsiness and altered mental states. We also discussed that there is always a risk not just to her but also people around her. she has been encouraged to call the office with any questions or concerns that should arise related to todays visit.    Time spent: 25 This patient was seen by Orson Gear AGNP-C in Collaboration with Dr. Devona Konig as a part of collaborative care agreement.   I have personally obtained a history, examined the patient, evaluated laboratory and imaging results, formulated the assessment and plan and placed orders.    Allyne Gee, MD [facility] Pulmonary and Critical Care Sleep medicine

## 2019-01-24 ENCOUNTER — Ambulatory Visit (INDEPENDENT_AMBULATORY_CARE_PROVIDER_SITE_OTHER): Payer: Medicare HMO | Admitting: Family Medicine

## 2019-01-24 ENCOUNTER — Encounter: Payer: Self-pay | Admitting: Family Medicine

## 2019-01-24 VITALS — BP 105/69 | HR 66 | Temp 97.6°F | Ht 65.0 in | Wt 120.7 lb

## 2019-01-24 DIAGNOSIS — J441 Chronic obstructive pulmonary disease with (acute) exacerbation: Secondary | ICD-10-CM

## 2019-01-24 NOTE — Progress Notes (Signed)
BP 105/69 (BP Location: Left Arm, Patient Position: Sitting, Cuff Size: Normal)   Pulse 66   Temp 97.6 F (36.4 C) (Oral)   Ht 5\' 5"  (1.651 m)   Wt 120 lb 11.2 oz (54.7 kg)   SpO2 99%   BMI 20.09 kg/m    Subjective:    Patient ID: Cathy Solis, female    DOB: 08/24/1941, 78 y.o.   MRN: [id number]  HPI: Cathy Solis is a 78 y.o. female  Chief Complaint  Patient presents with  . Hospitalization Follow-up    Patient states she's feeling better   Transition of Care Hospital Follow up.   Hospital/Facility: [facility] D/C Physician: Dr. Margaretmary Eddy D/C Date: 01/16/19  Records Requested:  01/24/19  Records Received:  01/24/19  Records Reviewed:  01/24/19   Diagnoses on Discharge: COPD exacerbation, pressure injury of skin  Date of interactive Contact within 48 hours of discharge: 01/17/19 Contact was through: phone  Date of 7 day or 14 day face-to-face visit:  01/24/19  within 14 days  Outpatient Encounter Medications as of 01/24/2019  Medication Sig  . albuterol (PROVENTIL HFA;VENTOLIN HFA) 108 (90 Base) MCG/ACT inhaler Inhale 2 puffs into the lungs every 6 (six) hours as needed for wheezing or shortness of breath.  [name] amLODipine (NORVASC) 10 MG tablet Take 1 tablet (10 mg total) by mouth daily.  . Fluticasone-Salmeterol (ADVAIR DISKUS) 250-50 MCG/DOSE AEPB Inhale 1 puff into the lungs 2 (two) times daily.  [name] ipratropium-albuterol (DUONEB) 0.5-2.5 (3) MG/3ML SOLN Take 3 mLs by nebulization 2 (two) times daily for 4 days. DuoNeb nebulizer treatments twice a day for 4 days followed by as needed for severe shortness of breath  . levothyroxine (SYNTHROID, LEVOTHROID) 175 MCG tablet TAKE 1 TABLET BY MOUTH EVERY DAY  . lisinopril (PRINIVIL,ZESTRIL) 5 MG tablet TAKE 1 TABLET BY MOUTH EVERY DAY  . metoprolol tartrate (LOPRESSOR) 25 MG tablet Take 1 tablet (25 mg total) by mouth 2 (two) times daily.  . Vitamin D, Cholecalciferol, 400 units TABS Take by mouth 2 (two) times daily.  [name] denosumab (PROLIA)  60 MG/ML SOSY injection Inject 60 mg into the skin once for 1 dose.  . fexofenadine (ALLEGRA ALLERGY) 180 MG tablet Take 1 tablet (180 mg total) by mouth daily. (Patient not taking: Reported on 01/24/2019)  . [DISCONTINUED] predniSONE (STERAPRED UNI-PAK 21 TAB) 10 MG (21) TBPK tablet Take 1 tablet (10 mg total) by mouth daily. Take 6 tablets by mouth for 1 day followed by  5 tablets by mouth for 1 day followed by  4 tablets by mouth for 1 day followed by  3 tablets by mouth for 1 day followed by  2 tablets by mouth for 1 day followed by  1 tablet by mouth for a day and stop (Patient not taking: Reported on 01/24/2019)   No facility-administered encounter medications on file as of 01/24/2019.     PER HOSPITALIST: " HOSPITAL COURSE:  HPI  [name] a40 y.o.femalewith a known history of breast cancer, hypertension, COPD, history of previous CVA who presents to the hospital due to worsening shortness of breath. Patient says she has been feeling short of breath now progressively getting worse for the past 3 to 4 days. She admits to a cough which is nonproductive but no fever chills nausea or vomiting. She says that today she was so short of breath that she could not even get out of bed. She came to the ER was noted to be in acute respiratory failure  with hypoxia secondary to COPD exacerbation. Hospitalist services were contacted for admission. Patient denies any chest pains, nausea, vomiting, abdominal pain, sick contacts, melena, hematochezia, dysuria or any other associated symptoms presently.   1. Acute respiratory failure with hypoxia-secondary to COPD exacerbation. Patient clinically is doing fine.  Wean off oxygen to room air -Taper IV steroids to p.o. prednisone and continue steroids nebulizer treatments at home -Outpatient follow-up with pulmonology as recommended.  Patient refused to go to [facility] pul[facility]ology and agreeable to go to [facility] pulmonology will refer her to Dr. Humphrey Rolls  2.  COPD exacerbation-because of patient's worsening respiratory failure and hypoxia. -Chest x-ray is negative for pneumonia, this is likely secondary to ongoing tobacco abuse.  -Taper IV steroids to p.o. prednisone and continue steroids nebulizer treatments at home -Stop smoking refusing nicotine patch  3.chest pain -EKG no acute ST-T wave changes Troponin negative.  Acute MI ruled out  4. History of recent UTI-patient was noted to have urine cultures positive for E. coli which was pansensitive. -Completed Macrobid course  5. History of hypothyroidism-continue Synthroid.  6.Essential hypertension-continue Norvasc, lisinopril, metoprolol.  Discharge home, patient did not qualify for oxygen per RN"   Diagnostic Tests Reviewed:  CLINICAL DATA:  Shortness of breath  EXAM: CHEST - 2 VIEW  COMPARISON:  Chest CT 03/29/2016  FINDINGS: The lungs are hyperinflated with diffuse coarse interstitial markings and bibasilar atelectasis. No focal consolidation or pulmonary edema. Mild cardiomegaly with calcific aortic atherosclerosis. No pleural effusion or pneumothorax.  IMPRESSION: COPD with bibasilar atelectasis.  No acute findings.  Disposition: Home  Consults: None  Discharge Instructions:  Follow up here and with pulmonology  Disease/illness Education: Given today in writing  Home Health/Community Services Discussions/Referrals: N/A  Establishment or re-establishment of referral orders for community resources: N/A  Discussion with other health care providers: N/A  Assessment and Support of treatment regimen adherence: Fair- does not want to use inhalers  Appointments Coordinated with: Patient   Education for self-management, independent living, and ADLs: Discussed today.  Since she got out of the hospital, Cathy Solis is feeling much better. She saw pulmonology on 01/22/19 and did not want to take any inhalers. She is going to follow up back up with them in 3  months if she is not feeling better. She states that she just got short winded, but all the inhalers at the hospital made her cough. She is very upset about the hospital in general, but is feeling much better today. No fevers. No SOB. No coughing. No other concerns.   Relevant past medical, surgical, family and social history reviewed and updated as indicated. Interim medical history since our last visit reviewed. Allergies and medications reviewed and updated.  Review of Systems  Constitutional: Negative.   HENT: Negative.   Respiratory: Negative.   Cardiovascular: Negative.   Neurological: Negative.   Psychiatric/Behavioral: Negative.     Per HPI unless specifically indicated above     Objective:    BP 105/69 (BP Location: Left Arm, Patient Position: Sitting, Cuff Size: Normal)   Pulse 66   Temp 97.6 F (36.4 C) (Oral)   Ht 5\' 5"  (1.651 m)   Wt 120 lb 11.2 oz (54.7 kg)   SpO2 99%   BMI 20.09 kg/m   Wt Readings from Last 3 Encounters:  01/24/19 120 lb 11.2 oz (54.7 kg)  01/22/19 115 lb (52.2 kg)  01/14/19 113 lb 1.6 oz (51.3 kg)    Physical Exam Vitals signs and nursing note reviewed.  Constitutional:  General: She is not in acute distress.    Appearance: Normal appearance. She is not ill-appearing, toxic-appearing or diaphoretic.  HENT:     Head: Normocephalic and atraumatic.     Right Ear: External ear normal.     Left Ear: External ear normal.     Nose: Nose normal.     Mouth/Throat:     Mouth: Mucous membranes are moist.     Pharynx: Oropharynx is clear.  Eyes:     General: No scleral icterus.       Right eye: No discharge.        Left eye: No discharge.     Extraocular Movements: Extraocular movements intact.     Conjunctiva/sclera: Conjunctivae normal.     Pupils: Pupils are equal, round, and reactive to light.  Neck:     Musculoskeletal: Normal range of motion and neck supple.  Cardiovascular:     Rate and Rhythm: Normal rate and regular rhythm.      Pulses: Normal pulses.     Heart sounds: Normal heart sounds. No murmur. No friction rub. No gallop.   Pulmonary:     Effort: Pulmonary effort is normal. No respiratory distress.     Breath sounds: Normal breath sounds. No stridor. No wheezing, rhonchi or rales.  Chest:     Chest wall: No tenderness.  Musculoskeletal: Normal range of motion.  Skin:    General: Skin is warm and dry.     Capillary Refill: Capillary refill takes less than 2 seconds.     Coloration: Skin is not jaundiced or pale.     Findings: No bruising, erythema, lesion or rash.  Neurological:     General: No focal deficit present.     Mental Status: She is alert and oriented to person, place, and time. Mental status is at baseline.  Psychiatric:        Mood and Affect: Mood normal.        Behavior: Behavior normal.        Thought Content: Thought content normal.        Judgment: Judgment normal.     Results for orders placed or performed during the hospital encounter of 01/14/19  Blood culture (routine x 2)  Result Value Ref Range   Specimen Description BLOOD BLOOD RIGHT FOREARM    Special Requests      BOTTLES DRAWN AEROBIC AND ANAEROBIC Blood Culture results may not be optimal due to an excessive volume of blood received in culture bottles   Culture      NO GROWTH 5 DAYS Performed at [facility], [address] [place], [place] [address]    Report Status 01/19/2019 FINAL   Blood culture (routine x 2)  Result Value Ref Range   Specimen Description BLOOD BLOOD LEFT FOREARM    Special Requests      BOTTLES DRAWN AEROBIC AND ANAEROBIC Blood Culture results may not be optimal due to an excessive volume of blood received in culture bottles   Culture      NO GROWTH 5 DAYS Performed at [facility], [address] [place], [place] [address]    Report Status 01/19/2019 FINAL   CBC  Result Value Ref Range   WBC 12.3 (H) 4.0 - 10.5 K/uL   RBC 4.42 3.87 - 5.11 MIL/uL   Hemoglobin 13.8  12.0 - 15.0 g/dL   HCT 40.5 36.0 - 46.0 %   MCV 91.6 80.0 - 100.0 fL   MCH 31.2 26.0 - 34.0  pg   MCHC 34.1 30.0 - 36.0 g/dL   RDW 13.3 11.5 - 15.5 %   Platelets 261 150 - 400 K/uL   nRBC 0.0 0.0 - 0.2 %  Comprehensive metabolic panel  Result Value Ref Range   Sodium 132 (L) 135 - 145 mmol/L   Potassium 3.1 (L) 3.5 - 5.1 mmol/L   Chloride 99 98 - 111 mmol/L   CO2 22 22 - 32 mmol/L   Glucose, Bld 153 (H) 70 - 99 mg/dL   BUN 24 (H) 8 - 23 mg/dL   Creatinine, Ser 0.93 0.44 - 1.00 mg/dL   Calcium 8.5 (L) 8.9 - 10.3 mg/dL   Total Protein 7.5 6.5 - 8.1 g/dL   Albumin 3.7 3.5 - 5.0 g/dL   AST 23 15 - 41 U/L   ALT 16 0 - 44 U/L   Alkaline Phosphatase 47 38 - 126 U/L   Total Bilirubin 0.7 0.3 - 1.2 mg/dL   GFR calc non Af Amer 58 (L) >60 mL/min   GFR calc Af Amer >60 >60 mL/min   Anion gap 11 5 - 15  Brain natriuretic peptide  Result Value Ref Range   B Natriuretic Peptide 300.0 (H) 0.0 - 100.0 pg/mL  Troponin I - ONCE - STAT  Result Value Ref Range   Troponin I <0.03 <0.03 ng/mL  Influenza panel by PCR (type A & B)  Result Value Ref Range   Influenza A By PCR NEGATIVE NEGATIVE   Influenza B By PCR NEGATIVE NEGATIVE  Basic metabolic panel  Result Value Ref Range   Sodium 135 135 - 145 mmol/L   Potassium 3.7 3.5 - 5.1 mmol/L   Chloride 106 98 - 111 mmol/L   CO2 23 22 - 32 mmol/L   Glucose, Bld 143 (H) 70 - 99 mg/dL   BUN 24 (H) 8 - 23 mg/dL   Creatinine, Ser 0.92 0.44 - 1.00 mg/dL   Calcium 8.0 (L) 8.9 - 10.3 mg/dL   GFR calc non Af Amer 59 (L) >60 mL/min   GFR calc Af Amer >60 >60 mL/min   Anion gap 6 5 - 15  Troponin I - Now Then Q6H  Result Value Ref Range   Troponin I <0.03 <0.03 ng/mL  Troponin I - Now Then Q6H  Result Value Ref Range   Troponin I 0.03 (HH) <0.03 ng/mL  Troponin I - Now Then Q6H  Result Value Ref Range   Troponin I <0.03 <0.03 ng/mL      Assessment & Plan:   Problem List Items Addressed This Visit      Respiratory   COPD exacerbation (HCC)  - Primary    Resolved. Lungs clear. Does not want to take her inhaler. Call with any concerns or if not getting better.           Follow up plan: Return As scheduled.

## 2019-01-24 NOTE — Assessment & Plan Note (Signed)
Resolved. Lungs clear. Does not want to take her inhaler. Call with any concerns or if not getting better.

## 2019-02-12 DIAGNOSIS — R69 Illness, unspecified: Secondary | ICD-10-CM | POA: Diagnosis not present

## 2019-02-18 DIAGNOSIS — S90122A Contusion of left lesser toe(s) without damage to nail, initial encounter: Secondary | ICD-10-CM | POA: Diagnosis not present

## 2019-02-18 DIAGNOSIS — S9032XA Contusion of left foot, initial encounter: Secondary | ICD-10-CM | POA: Diagnosis not present

## 2019-02-18 DIAGNOSIS — M79672 Pain in left foot: Secondary | ICD-10-CM | POA: Diagnosis not present

## 2019-02-18 DIAGNOSIS — L03032 Cellulitis of left toe: Secondary | ICD-10-CM | POA: Diagnosis not present

## 2019-03-04 DIAGNOSIS — L03032 Cellulitis of left toe: Secondary | ICD-10-CM | POA: Diagnosis not present

## 2019-03-06 ENCOUNTER — Other Ambulatory Visit: Payer: Self-pay | Admitting: Family Medicine

## 2019-04-04 ENCOUNTER — Ambulatory Visit: Payer: Medicare HMO | Admitting: Family Medicine

## 2019-04-25 ENCOUNTER — Ambulatory Visit: Payer: Self-pay | Admitting: Adult Health

## 2019-04-25 ENCOUNTER — Ambulatory Visit (INDEPENDENT_AMBULATORY_CARE_PROVIDER_SITE_OTHER): Payer: Medicare HMO | Admitting: Adult Health

## 2019-04-25 ENCOUNTER — Other Ambulatory Visit: Payer: Self-pay

## 2019-04-25 ENCOUNTER — Encounter: Payer: Self-pay | Admitting: Adult Health

## 2019-04-25 VITALS — Resp 16

## 2019-04-25 DIAGNOSIS — J449 Chronic obstructive pulmonary disease, unspecified: Secondary | ICD-10-CM | POA: Diagnosis not present

## 2019-04-25 DIAGNOSIS — Z87891 Personal history of nicotine dependence: Secondary | ICD-10-CM | POA: Diagnosis not present

## 2019-04-25 DIAGNOSIS — I10 Essential (primary) hypertension: Secondary | ICD-10-CM

## 2019-04-25 NOTE — Progress Notes (Signed)
[facility] [place], [place] [address]  Internal MEDICINE  Telephone Visit  Patient Name: Cathy Solis  [id number]  [id number]  Date of Service: 04/25/2019  I connected with the patient at  1038 by telephone and verified the patients identity using two identifiers.   I discussed the limitations, risks, security and privacy concerns of performing an evaluation and management service by telephone and the availability of in person appointments. I also discussed with the patient that there may be a patient responsible charge related to the service.  The patient expressed understanding and agrees to proceed.    Chief Complaint  Patient presents with  . Telephone Screen  . Telephone Assessment  . Medication Management    does not want to use inhalers  . COPD    HPI . Pt seen via phone for follow up on copd.  She reports she is no longer using her inhalers.  She reports they make her dizzy.  She does use her nebulizer intermittently with no difficulty.     Current Medication: Outpatient Encounter Medications as of 04/25/2019  Medication Sig  . amLODipine (NORVASC) 10 MG tablet Take 1 tablet (10 mg total) by mouth daily.  [name] denosumab (PROLIA) 60 MG/ML SOSY injection Inject 60 mg into the skin once for 1 dose.  . levothyroxine (SYNTHROID, LEVOTHROID) 175 MCG tablet TAKE 1 TABLET BY MOUTH EVERY DAY  . lisinopril (PRINIVIL,ZESTRIL) 5 MG tablet TAKE 1 TABLET BY MOUTH EVERY DAY  . metoprolol tartrate (LOPRESSOR) 25 MG tablet Take 1 tablet (25 mg total) by mouth 2 (two) times daily.  [name] albuterol (PROVENTIL HFA;VENTOLIN HFA) 108 (90 Base) MCG/ACT inhaler Inhale 2 puffs into the lungs every 6 (six) hours as needed for wheezing or shortness of breath. (Patient not taking: Reported on 04/25/2019)  . Fluticasone-Salmeterol (ADVAIR DISKUS) 250-50 MCG/DOSE AEPB Inhale 1 puff into the lungs 2 (two) times daily. (Patient not taking: Reported on 04/25/2019)  . ipratropium-albuterol  (DUONEB) 0.5-2.5 (3) MG/3ML SOLN Take 3 mLs by nebulization 2 (two) times daily for 4 days. DuoNeb nebulizer treatments twice a day for 4 days followed by as needed for severe shortness of breath  . [DISCONTINUED] fexofenadine (ALLEGRA ALLERGY) 180 MG tablet Take 1 tablet (180 mg total) by mouth daily. (Patient not taking: Reported on 01/24/2019)  . [DISCONTINUED] Vitamin D, Cholecalciferol, 400 units TABS Take by mouth 2 (two) times daily.   No facility-administered encounter medications on file as of 04/25/2019.     Surgical History: Past Surgical History:  Procedure Laterality Date  . ABDOMINAL HYSTERECTOMY    . BLADDER REPAIR    . BREAST LUMPECTOMY Left   . BREAST SURGERY     left  . CAROTID ENDARTERECTOMY  2008   left  . COLONOSCOPY  2008  . EYE SURGERY Right 2013   cataract  . KIDNEY SURGERY  1949    Medical History: Past Medical History:  Diagnosis Date  . Breast cancer [facility]) 2006   Left breast, s/p radiation  . 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 Hypertension   . Kidney problem    Undeveloped R kidney  . 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 Personal history of tobacco use, presenting hazards to health 03/18/2016  . Stroke [facility])    residual left sided weakness  . Toe infection    followed by Dr. Jens Som    Family History: Family History  Problem Relation Age of Onset  .  Stroke Mother   . Heart disease Father   . Heart attack Maternal Grandfather     Social History   Socioeconomic History  . Marital status: Widowed    Spouse name: Not on file  . Number of children: Not on file  . Years of education: Not on file  . Highest education level: High school graduate  Occupational History  . Not on file  Social Needs  . Financial resource strain: Not hard at all  . Food insecurity:    Worry: Never true    Inability: Never true  . Transportation needs:    Medical: No    Non-medical: No  Tobacco Use  . Smoking status:  Former Smoker    Packs/day: 1.00    Years: 30.00    Pack years: 30.00    Types: Cigarettes    Last attempt to quit: 01/08/2019    Years since quitting: 0.2  . Smokeless tobacco: Never Used  Substance and Sexual Activity  . Alcohol use: No  . Drug use: No  . Sexual activity: Never  Lifestyle  . Physical activity:    Days per week: 0 days    Minutes per session: 0 min  . Stress: Not at all  Relationships  . Social connections:    Talks on phone: More than three times a week    Gets together: More than three times a week    Attends religious service: More than 4 times per year    Active member of club or organization: No    Attends meetings of clubs or organizations: Never    Relationship status: Widowed  . Intimate partner violence:    Fear of current or ex partner: No    Emotionally abused: No    Physically abused: No    Forced sexual activity: No  Other Topics Concern  . Not on file  Social History Narrative  . Not on file      Review of Systems  Vital Signs: Resp 16    Observation/Objective:  Speaking in full sentences.  NAd noted.   Assessment/Plan: 1. Chronic obstructive pulmonary disease, unspecified COPD type ([place]) Stable, continue present management.  Encouraged continued nebulizer use as needed for sob.   2. Hypertension, unspecified type Stable, continue to follow up with pcp.  3. Quit smoking within past year Encouraged continued cessation.   General Counseling: aisling emigh understanding of the findings of today's phone visit and agrees with plan of treatment. I have discussed any further diagnostic evaluation that may be needed or ordered today. We also reviewed her medications today. she has been encouraged to call the office with any questions or concerns that should arise related to todays visit.    No orders of the defined types were placed in this encounter.   No orders of the defined types were placed in this encounter.   Time  spent: 11 Minutes    Orson Gear AGNP-C Pulmonary medicine

## 2019-05-06 ENCOUNTER — Encounter (INDEPENDENT_AMBULATORY_CARE_PROVIDER_SITE_OTHER): Payer: Medicare HMO

## 2019-05-06 ENCOUNTER — Ambulatory Visit (INDEPENDENT_AMBULATORY_CARE_PROVIDER_SITE_OTHER): Payer: Medicare HMO | Admitting: Vascular Surgery

## 2019-05-06 ENCOUNTER — Telehealth: Payer: Self-pay

## 2019-05-06 ENCOUNTER — Ambulatory Visit (INDEPENDENT_AMBULATORY_CARE_PROVIDER_SITE_OTHER): Payer: Medicare HMO

## 2019-05-06 NOTE — Telephone Encounter (Signed)
Patient was verified for [employer] today and I am waiting for Summary of Benefits to come back

## 2019-05-09 ENCOUNTER — Ambulatory Visit (INDEPENDENT_AMBULATORY_CARE_PROVIDER_SITE_OTHER): Payer: Medicare HMO | Admitting: Family Medicine

## 2019-05-09 ENCOUNTER — Other Ambulatory Visit: Payer: Self-pay

## 2019-05-09 ENCOUNTER — Encounter: Payer: Self-pay | Admitting: Family Medicine

## 2019-05-09 VITALS — BP 120/64 | HR 51 | Temp 97.6°F | Ht 65.0 in | Wt 111.0 lb

## 2019-05-09 DIAGNOSIS — I4510 Unspecified right bundle-branch block: Secondary | ICD-10-CM

## 2019-05-09 DIAGNOSIS — R079 Chest pain, unspecified: Secondary | ICD-10-CM | POA: Diagnosis not present

## 2019-05-09 DIAGNOSIS — M25512 Pain in left shoulder: Secondary | ICD-10-CM | POA: Diagnosis not present

## 2019-05-09 MED ORDER — SUCRALFATE 1 GM/10ML PO SUSP: 1.0000 g | Freq: Three times a day (TID) | ORAL | 0 refills | Status: DC

## 2019-05-09 NOTE — Assessment & Plan Note (Signed)
Acute pain after eating in L shoulder- hadn't done anything. Will get her into cardio for evaluation. Call with any concerns.

## 2019-05-09 NOTE — Patient Instructions (Signed)
Shoulder Exercises Ask your health care provider which exercises are safe for you. Do exercises exactly as told by your health care provider and adjust them as directed. It is normal to feel mild stretching, pulling, tightness, or discomfort as you do these exercises, but you should stop right away if you feel sudden pain or your pain gets worse.Do not begin these exercises until told by your health care provider. Range of Motion Exercises        These exercises warm up your muscles and joints and improve the movement and flexibility of your shoulder. These exercises also help to relieve pain, numbness, and tingling. These exercises involve stretching your injured shoulder directly. Exercise A: Pendulum 1. Stand near a wall or a surface that you can hold onto for balance. 2. Bend at the waist and let your left / right arm hang straight down. Use your other arm to support you. Keep your back straight and do not lock your knees. 3. Relax your left / right arm and shoulder muscles, and move your hips and your trunk so your left / right arm swings freely. Your arm should swing because of the motion of your body, not because you are using your arm or shoulder muscles. 4. Keep moving your body so your arm swings in the following directions, as told by your health care provider: ? Side to side. ? Forward and backward. ? In clockwise and counterclockwise circles. 5. Continue each motion for __________ seconds, or for as long as told by your health care provider. 6. Slowly return to the starting position. Repeat __________ times. Complete this exercise __________ times a day. Exercise B:Flexion, Standing 1. Stand and hold a broomstick, a cane, or a similar object. Place your hands a little more than shoulder-width apart on the object. Your left / right hand should be palm-up, and your other hand should be palm-down. 2. Keep your elbow straight and keep your shoulder muscles relaxed. Push the stick  down with your healthy arm to raise your left / right arm in front of your body, and then over your head until you feel a stretch in your shoulder. ? Avoid shrugging your shoulder while you raise your arm. Keep your shoulder blade tucked down toward the middle of your back. 3. Hold for __________ seconds. 4. Slowly return to the starting position. Repeat __________ times. Complete this exercise __________ times a day. Exercise C: Abduction, Standing 1. Stand and hold a broomstick, a cane, or a similar object. Place your hands a little more than shoulder-width apart on the object. Your left / right hand should be palm-up, and your other hand should be palm-down. 2. While keeping your elbow straight and your shoulder muscles relaxed, push the stick across your body toward your left / right side. Raise your left / right arm to the side of your body and then over your head until you feel a stretch in your shoulder. ? Do not raise your arm above shoulder height, unless your health care provider tells you to do that. ? Avoid shrugging your shoulder while you raise your arm. Keep your shoulder blade tucked down toward the middle of your back. 3. Hold for __________ seconds. 4. Slowly return to the starting position. Repeat __________ times. Complete this exercise __________ times a day. Exercise D:Internal Rotation 1. Place your left / right hand behind your back, palm-up. 2. Use your other hand to dangle an exercise band, a towel, or a similar object over your shoulder.   Grasp the band with your left / right hand so you are holding onto both ends. 3. Gently pull up on the band until you feel a stretch in the front of your left / right shoulder. ? Avoid shrugging your shoulder while you raise your arm. Keep your shoulder blade tucked down toward the middle of your back. 4. Hold for __________ seconds. 5. Release the stretch by letting go of the band and lowering your hands. Repeat __________ times.  Complete this exercise __________ times a day. Stretching Exercises  These exercises warm up your muscles and joints and improve the movement and flexibility of your shoulder. These exercises also help to relieve pain, numbness, and tingling. These exercises are done using your healthy shoulder to help stretch the muscles of your injured shoulder. Exercise E: Corner Stretch (External Rotation and Abduction) 1. Stand in a doorway with one of your feet slightly in front of the other. This is called a staggered stance. If you cannot reach your forearms to the door frame, stand facing a corner of a room. 2. Choose one of the following positions as told by your health care provider: ? Place your hands and forearms on the door frame above your head. ? Place your hands and forearms on the door frame at the height of your head. ? Place your hands on the door frame at the height of your elbows. 3. Slowly move your weight onto your front foot until you feel a stretch across your chest and in the front of your shoulders. Keep your head and chest upright and keep your abdominal muscles tight. 4. Hold for __________ seconds. 5. To release the stretch, shift your weight to your back foot. Repeat __________ times. Complete this stretch __________ times a day. Exercise F:Extension, Standing 1. Stand and hold a broomstick, a cane, or a similar object behind your back. ? Your hands should be a little wider than shoulder-width apart. ? Your palms should face away from your back. 2. Keeping your elbows straight and keeping your shoulder muscles relaxed, move the stick away from your body until you feel a stretch in your shoulder. ? Avoid shrugging your shoulders while you move the stick. Keep your shoulder blade tucked down toward the middle of your back. 3. Hold for __________ seconds. 4. Slowly return to the starting position. Repeat __________ times. Complete this exercise __________ times a  day. Strengthening Exercises           These exercises build strength and endurance in your shoulder. Endurance is the ability to use your muscles for a long time, even after they get tired. Exercise G:External Rotation 1. Sit in a stable chair without armrests. 2. Secure an exercise band at elbow height on your left / right side. 3. Place a soft object, such as a folded towel or a small pillow, between your left / right upper arm and your body to move your elbow a few inches away (about 10 cm) from your side. 4. Hold the end of the band so it is tight and there is no slack. 5. Keeping your elbow pressed against the soft object, move your left / right forearm out, away from your abdomen. Keep your body steady so only your forearm moves. 6. Hold for __________ seconds. 7. Slowly return to the starting position. Repeat __________ times. Complete this exercise __________ times a day. Exercise H:Shoulder Abduction 1. Sit in a stable chair without armrests, or stand. 2. Hold a __________ weight in your   left / right hand, or hold an exercise band with both hands. 3. Start with your arms straight down and your left / right palm facing in, toward your body. 4. Slowly lift your left / right hand out to your side. Do not lift your hand above shoulder height unless your health care provider tells you that this is safe. ? Keep your arms straight. ? Avoid shrugging your shoulder while you do this movement. Keep your shoulder blade tucked down toward the middle of your back. 5. Hold for __________ seconds. 6. Slowly lower your arm, and return to the starting position. Repeat __________ times. Complete this exercise __________ times a day. Exercise I:Shoulder Extension 1. Sit in a stable chair without armrests, or stand. 2. Secure an exercise band to a stable object in front of you where it is at shoulder height. 3. Hold one end of the exercise band in each hand. Your palms should face each  other. 4. Straighten your elbows and lift your hands up to shoulder height. 5. Step back, away from the secured end of the exercise band, until the band is tight and there is no slack. 6. Squeeze your shoulder blades together as you pull your hands down to the sides of your thighs. Stop when your hands are straight down by your sides. Do not let your hands go behind your body. 7. Hold for __________ seconds. 8. Slowly return to the starting position. Repeat __________ times. Complete this exercise __________ times a day. Exercise J:Standing Shoulder Row 1. Sit in a stable chair without armrests, or stand. 2. Secure an exercise band to a stable object in front of you so it is at waist height. 3. Hold one end of the exercise band in each hand. Your palms should be in a thumbs-up position. 4. Bend each of your elbows to an "L" shape (about 90 degrees) and keep your upper arms at your sides. 5. Step back until the band is tight and there is no slack. 6. Slowly pull your elbows back behind you. 7. Hold for __________ seconds. 8. Slowly return to the starting position. Repeat __________ times. Complete this exercise __________ times a day. Exercise K:Shoulder Press-Ups 1. Sit in a stable chair that has armrests. Sit upright, with your feet flat on the floor. 2. Put your hands on the armrests so your elbows are bent and your fingers are pointing forward. Your hands should be about even with the sides of your body. 3. Push down on the armrests and use your arms to lift yourself off of the chair. Straighten your elbows and lift yourself up as much as you comfortably can. ? Move your shoulder blades down, and avoid letting your shoulders move up toward your ears. ? Keep your feet on the ground. As you get stronger, your feet should support less of your body weight as you lift yourself up. 4. Hold for __________ seconds. 5. Slowly lower yourself back into the chair. Repeat __________ times. Complete  this exercise __________ times a day. Exercise L: Wall Push-Ups 1. Stand so you are facing a stable wall. Your feet should be about one arm-length away from the wall. 2. Lean forward and place your palms on the wall at shoulder height. 3. Keep your feet flat on the floor as you bend your elbows and lean forward toward the wall. 4. Hold for __________ seconds. 5. Straighten your elbows to push yourself back to the starting position. Repeat __________ times. Complete this exercise __________ times   a day. This information is not intended to replace advice given to you by your health care provider. Make sure you discuss any questions you have with your health care provider. Document Released: 09/21/2005 Document Revised: 03/13/2018 Document Reviewed: 07/19/2015 Elsevier Interactive Patient Education  2019 [employer]  

## 2019-05-09 NOTE — Progress Notes (Signed)
BP 120/64   Pulse (!) 51   Temp 97.6 F (36.4 C) (Oral)   Ht 5\' 5"  (1.651 m)   Wt 111 lb (50.3 kg)   SpO2 97%   BMI 18.47 kg/m    Subjective:    Patient ID: Cathy Solis, female    DOB: [birth year], 78 y.o.   MRN: [id number]  HPI: Cathy Solis is a 78 y.o. female  Chief Complaint  Patient presents with  . Shoulder Pain    left shoulder blade since Sunday night   SHOULDER PAIN- started with indigestion after eating fried chicken, continues with pain in her back.  Duration: 5 days Involved shoulder: left Mechanism of injury: unknown Location: under shoulder blade Onset:sudden Severity: severe  Quality:  Constant ache Frequency: constant- a lot better today Radiation: no Aggravating factors: eating   Alleviating factors: nothing  Status: better Treatments attempted:antacids   Relief with NSAIDs?:  mild Weakness: no Numbness: no Decreased grip strength: no Redness: no Swelling: no Bruising: no Fevers: no  Relevant past medical, surgical, family and social history reviewed and updated as indicated. Interim medical history since our last visit reviewed. Allergies and medications reviewed and updated.  Review of Systems  Constitutional: Negative.   HENT: Negative.   Cardiovascular: Negative.   Gastrointestinal: Negative.  Negative for abdominal distention, abdominal pain, anal bleeding, blood in stool, constipation, diarrhea, nausea, rectal pain and vomiting.  Musculoskeletal: Positive for arthralgias, back pain and myalgias. Negative for gait problem, joint swelling, neck pain and neck stiffness.  Skin: Negative.   Neurological: Negative.   Psychiatric/Behavioral: Negative.     Per HPI unless specifically indicated above     Objective:    BP 120/64   Pulse (!) 51   Temp 97.6 F (36.4 C) (Oral)   Ht 5\' 5"  (1.651 m)   Wt 111 lb (50.3 kg)   SpO2 97%   BMI 18.47 kg/m   Wt Readings from Last 3 Encounters:  05/09/19 111 lb (50.3 kg)  01/24/19 120 lb  11.2 oz (54.7 kg)  01/22/19 115 lb (52.2 kg)    Physical Exam Vitals signs and nursing note reviewed.  Constitutional:      General: She is not in acute distress.    Appearance: Normal appearance. She is not ill-appearing, toxic-appearing or diaphoretic.  HENT:     Head: Normocephalic and atraumatic.     Right Ear: External ear normal.     Left Ear: External ear normal.     Nose: Nose normal.     Mouth/Throat:     Mouth: Mucous membranes are moist.     Pharynx: Oropharynx is clear.  Eyes:     General: No scleral icterus.       Right eye: No discharge.        Left eye: No discharge.     Extraocular Movements: Extraocular movements intact.     Conjunctiva/sclera: Conjunctivae normal.     Pupils: Pupils are equal, round, and reactive to light.  Neck:     Musculoskeletal: Normal range of motion and neck supple.  Cardiovascular:     Rate and Rhythm: Normal rate and regular rhythm.     Pulses: Normal pulses.     Heart sounds: Normal heart sounds. No murmur. No friction rub. No gallop.   Pulmonary:     Effort: Pulmonary effort is normal. No respiratory distress.     Breath sounds: Normal breath sounds. No stridor. No wheezing, rhonchi or rales.  Chest:  Chest wall: No tenderness.  Abdominal:     General: Abdomen is flat. Bowel sounds are normal. There is no distension.     Palpations: Abdomen is soft. There is no mass.     Tenderness: There is no abdominal tenderness. There is no right CVA tenderness, left CVA tenderness, guarding or rebound.     Hernia: No hernia is present.  Musculoskeletal: Normal range of motion.        General: Tenderness (Ribs 8-9 on the L posteriorly) present.  Skin:    General: Skin is warm and dry.     Capillary Refill: Capillary refill takes less than 2 seconds.     Coloration: Skin is not jaundiced or pale.     Findings: No bruising, erythema, lesion or rash.  Neurological:     General: No focal deficit present.     Mental Status: She is alert  and oriented to person, place, and time. Mental status is at baseline.  Psychiatric:        Mood and Affect: Mood normal.        Behavior: Behavior normal.        Thought Content: Thought content normal.        Judgment: Judgment normal.     Results for orders placed or performed during the hospital encounter of 01/14/19  Blood culture (routine x 2)   Specimen: BLOOD  Result Value Ref Range   Specimen Description BLOOD BLOOD RIGHT FOREARM    Special Requests      BOTTLES DRAWN AEROBIC AND ANAEROBIC Blood Culture results may not be optimal due to an excessive volume of blood received in culture bottles   Culture      NO GROWTH 5 DAYS Performed at [facility], [address] [place], [place] [address]    Report Status 01/19/2019 FINAL   Blood culture (routine x 2)   Specimen: BLOOD  Result Value Ref Range   Specimen Description BLOOD BLOOD LEFT FOREARM    Special Requests      BOTTLES DRAWN AEROBIC AND ANAEROBIC Blood Culture results may not be optimal due to an excessive volume of blood received in culture bottles   Culture      NO GROWTH 5 DAYS Performed at [facility], [address] [place], [place] [address]    Report Status 01/19/2019 FINAL   CBC  Result Value Ref Range   WBC 12.3 (H) 4.0 - 10.5 K/uL   RBC 4.42 3.87 - 5.11 MIL/uL   Hemoglobin 13.8 12.0 - 15.0 g/dL   HCT 40.5 36.0 - 46.0 %   MCV 91.6 80.0 - 100.0 fL   MCH 31.2 26.0 - 34.0 pg   MCHC 34.1 30.0 - 36.0 g/dL   RDW 13.3 11.5 - 15.5 %   Platelets 261 150 - 400 K/uL   nRBC 0.0 0.0 - 0.2 %  Comprehensive metabolic panel  Result Value Ref Range   Sodium 132 (L) 135 - 145 mmol/L   Potassium 3.1 (L) 3.5 - 5.1 mmol/L   Chloride 99 98 - 111 mmol/L   CO2 22 22 - 32 mmol/L   Glucose, Bld 153 (H) 70 - 99 mg/dL   BUN 24 (H) 8 - 23 mg/dL   Creatinine, Ser 0.93 0.44 - 1.00 mg/dL   Calcium 8.5 (L) 8.9 - 10.3 mg/dL   Total Protein 7.5 6.5 - 8.1 g/dL   Albumin 3.7 3.5 - 5.0 g/dL   AST 23 15  - 41 U/L  ALT 16 0 - 44 U/L   Alkaline Phosphatase 47 38 - 126 U/L   Total Bilirubin 0.7 0.3 - 1.2 mg/dL   GFR calc non Af Amer 58 (L) >60 mL/min   GFR calc Af Amer >60 >60 mL/min   Anion gap 11 5 - 15  Brain natriuretic peptide  Result Value Ref Range   B Natriuretic Peptide 300.0 (H) 0.0 - 100.0 pg/mL  Troponin I - ONCE - STAT  Result Value Ref Range   Troponin I <0.03 <0.03 ng/mL  Influenza panel by PCR (type A & B)  Result Value Ref Range   Influenza A By PCR NEGATIVE NEGATIVE   Influenza B By PCR NEGATIVE NEGATIVE  Basic metabolic panel  Result Value Ref Range   Sodium 135 135 - 145 mmol/L   Potassium 3.7 3.5 - 5.1 mmol/L   Chloride 106 98 - 111 mmol/L   CO2 23 22 - 32 mmol/L   Glucose, Bld 143 (H) 70 - 99 mg/dL   BUN 24 (H) 8 - 23 mg/dL   Creatinine, Ser 0.92 0.44 - 1.00 mg/dL   Calcium 8.0 (L) 8.9 - 10.3 mg/dL   GFR calc non Af Amer 59 (L) >60 mL/min   GFR calc Af Amer >60 >60 mL/min   Anion gap 6 5 - 15  Troponin I - Now Then Q6H  Result Value Ref Range   Troponin I <0.03 <0.03 ng/mL  Troponin I - Now Then Q6H  Result Value Ref Range   Troponin I 0.03 (HH) <0.03 ng/mL  Troponin I - Now Then Q6H  Result Value Ref Range   Troponin I <0.03 <0.03 ng/mL      Assessment & Plan:   Problem List Items Addressed This Visit      Cardiovascular and Mediastinum   RBBB    Acute pain after eating in L shoulder- hadn't done anything. Will get her into cardio for evaluation. Call with any concerns.       Relevant Orders   Ambulatory referral to Cardiology    Other Visit Diagnoses    Acute pain of left shoulder    -  Primary   Posterior, unclear if gastritis, muscular or cardiac- will get her into cardio and start stretches and carafate. Recheck 1-2 weeks. Call with any concerns.    Relevant Orders   EKG 12-Lead (Completed)   Chest pain, unspecified type       Posterior, unclear if gastritis, muscular or cardiac- will get her into cardio and start stretches and  carafate. Recheck 1-2 weeks. Call with any concerns.    Relevant Orders   Ambulatory referral to Cardiology       Follow up plan: Return 1-2 weeks, for follow up shoulder pain.

## 2019-05-21 ENCOUNTER — Ambulatory Visit (INDEPENDENT_AMBULATORY_CARE_PROVIDER_SITE_OTHER): Payer: Medicare HMO | Admitting: Family Medicine

## 2019-05-21 ENCOUNTER — Encounter: Payer: Self-pay | Admitting: Family Medicine

## 2019-05-21 ENCOUNTER — Other Ambulatory Visit: Payer: Self-pay

## 2019-05-21 VITALS — BP 118/76 | HR 51 | Temp 97.4°F | Ht 65.0 in | Wt 110.0 lb

## 2019-05-21 DIAGNOSIS — E039 Hypothyroidism, unspecified: Secondary | ICD-10-CM

## 2019-05-21 DIAGNOSIS — I129 Hypertensive chronic kidney disease with stage 1 through stage 4 chronic kidney disease, or unspecified chronic kidney disease: Secondary | ICD-10-CM | POA: Diagnosis not present

## 2019-05-21 DIAGNOSIS — E782 Mixed hyperlipidemia: Secondary | ICD-10-CM | POA: Diagnosis not present

## 2019-05-21 DIAGNOSIS — M25512 Pain in left shoulder: Secondary | ICD-10-CM

## 2019-05-21 DIAGNOSIS — I10 Essential (primary) hypertension: Secondary | ICD-10-CM | POA: Diagnosis not present

## 2019-05-21 MED ORDER — AMLODIPINE BESYLATE 10 MG PO TABS: 10.0000 mg | ORAL_TABLET | Freq: Every day | ORAL | 0 refills | Status: DC

## 2019-05-21 MED ORDER — LISINOPRIL 5 MG PO TABS: ORAL_TABLET | ORAL | 0 refills | Status: DC

## 2019-05-21 MED ORDER — METOPROLOL TARTRATE 25 MG PO TABS: 25.0000 mg | ORAL_TABLET | Freq: Two times a day (BID) | ORAL | 0 refills | Status: DC

## 2019-05-21 NOTE — Assessment & Plan Note (Signed)
Under good control on current regimen. Continue current regimen. Continue to monitor. Call with any concerns. Refills given. Labs drawn today.   

## 2019-05-21 NOTE — Assessment & Plan Note (Signed)
Cannot tolerate statins or zetia. Will recheck levels today and treat as able. Call with any concerns.

## 2019-05-21 NOTE — Progress Notes (Signed)
BP 118/76   Pulse (!) 51   Temp (!) 97.4 F (36.3 C) (Oral)   Ht 5\' 5"  (1.651 m)   Wt 110 lb (49.9 kg)   SpO2 99%   BMI 18.30 kg/m    Subjective:    Patient ID: Cathy Solis, female    DOB: November 05, 1941, 78 y.o.   MRN: [id number]  HPI: Cathy Solis is a 77 y.o. female  Chief Complaint  Patient presents with  . Shoulder Pain    f/u patient states she is feeling better   Feeling much better. Did not keep the appointment with Dr. Ubaldo Glassing. She is not taking the carafate- never filled it. Feeling well. Feeling 100% normal. No shoulder pain. No other concerns. Feeling well.   HYPERTENSION / HYPERLIPIDEMIA Satisfied with current treatment? yes Duration of hypertension: chronic BP monitoring frequency: not checking BP medication side effects: no Past BP meds: lisinopril, amlodipine, metoprolol Duration of hyperlipidemia: chronic Cholesterol medication side effects: yes problems with statins and zetia Past cholesterol medications: yes Medication compliance: excellent compliance Aspirin: no Recent stressors: no Recurrent headaches: no Visual changes: no Palpitations: no Dyspnea: no Chest pain: no Lower extremity edema: no Dizzy/lightheaded: no  HYPOTHYROIDISM Thyroid control status:controlled Satisfied with current treatment? yes Medication side effects: no Medication compliance: excellent compliance Etiology of hypothyroidism:  Recent dose adjustment:no Fatigue: no Cold intolerance: no Heat intolerance: no Weight gain: no Weight loss: no Constipation: no Diarrhea/loose stools: no Palpitations: no Lower extremity edema: no Anxiety/depressed mood: no  Relevant past medical, surgical, family and social history reviewed and updated as indicated. Interim medical history since our last visit reviewed. Allergies and medications reviewed and updated.  Review of Systems  Constitutional: Negative.   Respiratory: Negative.   Cardiovascular: Negative.    Gastrointestinal: Negative.   Musculoskeletal: Negative.   Neurological: Negative.   Psychiatric/Behavioral: Negative.     Per HPI unless specifically indicated above     Objective:    BP 118/76   Pulse (!) 51   Temp (!) 97.4 F (36.3 C) (Oral)   Ht 5\' 5"  (1.651 m)   Wt 110 lb (49.9 kg)   SpO2 99%   BMI 18.30 kg/m   Wt Readings from Last 3 Encounters:  05/21/19 110 lb (49.9 kg)  05/09/19 111 lb (50.3 kg)  01/24/19 120 lb 11.2 oz (54.7 kg)    Physical Exam Vitals signs and nursing note reviewed.  Constitutional:      General: She is not in acute distress.    Appearance: Normal appearance. She is not ill-appearing, toxic-appearing or diaphoretic.  HENT:     Head: Normocephalic and atraumatic.     Right Ear: External ear normal.     Left Ear: External ear normal.     Nose: Nose normal.     Mouth/Throat:     Mouth: Mucous membranes are moist.     Pharynx: Oropharynx is clear.  Eyes:     General: No scleral icterus.       Right eye: No discharge.        Left eye: No discharge.     Extraocular Movements: Extraocular movements intact.     Conjunctiva/sclera: Conjunctivae normal.     Pupils: Pupils are equal, round, and reactive to light.  Neck:     Musculoskeletal: Normal range of motion and neck supple.  Cardiovascular:     Rate and Rhythm: Normal rate and regular rhythm.     Pulses: Normal pulses.     Heart sounds:  Normal heart sounds. No murmur. No friction rub. No gallop.   Pulmonary:     Effort: Pulmonary effort is normal. No respiratory distress.     Breath sounds: Normal breath sounds. No stridor. No wheezing, rhonchi or rales.  Chest:     Chest wall: No tenderness.  Musculoskeletal: Normal range of motion.  Skin:    General: Skin is warm and dry.     Capillary Refill: Capillary refill takes less than 2 seconds.     Coloration: Skin is not jaundiced or pale.     Findings: No bruising, erythema, lesion or rash.  Neurological:     General: No focal  deficit present.     Mental Status: She is alert and oriented to person, place, and time. Mental status is at baseline.  Psychiatric:        Mood and Affect: Mood normal.        Behavior: Behavior normal.        Thought Content: Thought content normal.        Judgment: Judgment normal.     Results for orders placed or performed during the hospital encounter of 01/14/19  Blood culture (routine x 2)   Specimen: BLOOD  Result Value Ref Range   Specimen Description BLOOD BLOOD RIGHT FOREARM    Special Requests      BOTTLES DRAWN AEROBIC AND ANAEROBIC Blood Culture results may not be optimal due to an excessive volume of blood received in culture bottles   Culture      NO GROWTH 5 DAYS Performed at [facility], [address] [place], [place] [address]    Report Status 01/19/2019 FINAL   Blood culture (routine x 2)   Specimen: BLOOD  Result Value Ref Range   Specimen Description BLOOD BLOOD LEFT FOREARM    Special Requests      BOTTLES DRAWN AEROBIC AND ANAEROBIC Blood Culture results may not be optimal due to an excessive volume of blood received in culture bottles   Culture      NO GROWTH 5 DAYS Performed at [facility], [address] [place], [place] [address]    Report Status 01/19/2019 FINAL   CBC  Result Value Ref Range   WBC 12.3 (H) 4.0 - 10.5 K/uL   RBC 4.42 3.87 - 5.11 MIL/uL   Hemoglobin 13.8 12.0 - 15.0 g/dL   HCT 40.5 36.0 - 46.0 %   MCV 91.6 80.0 - 100.0 fL   MCH 31.2 26.0 - 34.0 pg   MCHC 34.1 30.0 - 36.0 g/dL   RDW 13.3 11.5 - 15.5 %   Platelets 261 150 - 400 K/uL   nRBC 0.0 0.0 - 0.2 %  Comprehensive metabolic panel  Result Value Ref Range   Sodium 132 (L) 135 - 145 mmol/L   Potassium 3.1 (L) 3.5 - 5.1 mmol/L   Chloride 99 98 - 111 mmol/L   CO2 22 22 - 32 mmol/L   Glucose, Bld 153 (H) 70 - 99 mg/dL   BUN 24 (H) 8 - 23 mg/dL   Creatinine, Ser 0.93 0.44 - 1.00 mg/dL   Calcium 8.5 (L) 8.9 - 10.3 mg/dL   Total Protein 7.5 6.5 -  8.1 g/dL   Albumin 3.7 3.5 - 5.0 g/dL   AST 23 15 - 41 U/L   ALT 16 0 - 44 U/L   Alkaline Phosphatase 47 38 - 126 U/L   Total Bilirubin 0.7 0.3 - 1.2 mg/dL   GFR calc non Af  Amer 58 (L) >60 mL/min   GFR calc Af Amer >60 >60 mL/min   Anion gap 11 5 - 15  Brain natriuretic peptide  Result Value Ref Range   B Natriuretic Peptide 300.0 (H) 0.0 - 100.0 pg/mL  Troponin I - ONCE - STAT  Result Value Ref Range   Troponin I <0.03 <0.03 ng/mL  Influenza panel by PCR (type A & B)  Result Value Ref Range   Influenza A By PCR NEGATIVE NEGATIVE   Influenza B By PCR NEGATIVE NEGATIVE  Basic metabolic panel  Result Value Ref Range   Sodium 135 135 - 145 mmol/L   Potassium 3.7 3.5 - 5.1 mmol/L   Chloride 106 98 - 111 mmol/L   CO2 23 22 - 32 mmol/L   Glucose, Bld 143 (H) 70 - 99 mg/dL   BUN 24 (H) 8 - 23 mg/dL   Creatinine, Ser 0.92 0.44 - 1.00 mg/dL   Calcium 8.0 (L) 8.9 - 10.3 mg/dL   GFR calc non Af Amer 59 (L) >60 mL/min   GFR calc Af Amer >60 >60 mL/min   Anion gap 6 5 - 15  Troponin I - Now Then Q6H  Result Value Ref Range   Troponin I <0.03 <0.03 ng/mL  Troponin I - Now Then Q6H  Result Value Ref Range   Troponin I 0.03 (HH) <0.03 ng/mL  Troponin I - Now Then Q6H  Result Value Ref Range   Troponin I <0.03 <0.03 ng/mL      Assessment & Plan:   Problem List Items Addressed This Visit      Endocrine   Hypothyroidism - Primary    Will recheck levels today and treat as needed. Call with any concerns.       Relevant Medications   metoprolol tartrate (LOPRESSOR) 25 MG tablet   Other Relevant Orders   Comprehensive metabolic panel   TSH     Genitourinary   Benign hypertensive renal disease    Under good control on current regimen. Continue current regimen. Continue to monitor. Call with any concerns. Refills given. Labs drawn today.         Relevant Medications   metoprolol tartrate (LOPRESSOR) 25 MG tablet   amLODipine (NORVASC) 10 MG tablet   Other Relevant Orders    Comprehensive metabolic panel     Other   Hyperlipidemia    Cannot tolerate statins or zetia. Will recheck levels today and treat as able. Call with any concerns.       Relevant Medications   metoprolol tartrate (LOPRESSOR) 25 MG tablet   lisinopril (ZESTRIL) 5 MG tablet   amLODipine (NORVASC) 10 MG tablet   Other Relevant Orders   Comprehensive metabolic panel   Lipid Panel w/o Chol/HDL Ratio    Other Visit Diagnoses    Essential hypertension       Relevant Medications   metoprolol tartrate (LOPRESSOR) 25 MG tablet   lisinopril (ZESTRIL) 5 MG tablet   amLODipine (NORVASC) 10 MG tablet   Acute pain of left shoulder       Resolved. No other concerns. Continue to monitor.        Follow up plan: Return in about 3 months (around 08/21/2019).

## 2019-05-21 NOTE — Assessment & Plan Note (Signed)
Will recheck levels today and treat as needed. Call with any concerns.

## 2019-05-22 LAB — COMPREHENSIVE METABOLIC PANEL
ALT: 10 IU/L (ref 0–32)
AST: 18 IU/L (ref 0–40)
Albumin/Globulin Ratio: 1.8 (ref 1.2–2.2)
Albumin: 4.4 g/dL (ref 3.7–4.7)
Alkaline Phosphatase: 63 IU/L (ref 39–117)
BUN/Creatinine Ratio: 21 (ref 12–28)
BUN: 37 mg/dL — ABNORMAL HIGH (ref 8–27)
Bilirubin Total: 0.2 mg/dL (ref 0.0–1.2)
CO2: 24 mmol/L (ref 20–29)
Calcium: 9.9 mg/dL (ref 8.7–10.3)
Chloride: 96 mmol/L (ref 96–106)
Creatinine, Ser: 1.76 mg/dL — ABNORMAL HIGH (ref 0.57–1.00)
GFR calc Af Amer: 31 mL/min/{1.73_m2} — ABNORMAL LOW (ref >59)
GFR calc non Af Amer: 27 mL/min/{1.73_m2} — ABNORMAL LOW (ref >59)
Globulin, Total: 2.5 g/dL (ref 1.5–4.5)
Glucose: 85 mg/dL (ref 65–99)
Potassium: 4.7 mmol/L (ref 3.5–5.2)
Sodium: 137 mmol/L (ref 134–144)
Total Protein: 6.9 g/dL (ref 6.0–8.5)

## 2019-05-22 LAB — LIPID PANEL W/O CHOL/HDL RATIO
Cholesterol, Total: 237 mg/dL — ABNORMAL HIGH (ref 100–199)
HDL: 63 mg/dL (ref >39)
LDL Calculated: 125 mg/dL — ABNORMAL HIGH (ref 0–99)
Triglycerides: 243 mg/dL — ABNORMAL HIGH (ref 0–149)
VLDL Cholesterol Cal: 49 mg/dL — ABNORMAL HIGH (ref 5–40)

## 2019-05-22 LAB — TSH: TSH: 0.253 u[IU]/mL — ABNORMAL LOW (ref 0.450–4.500)

## 2019-05-27 ENCOUNTER — Telehealth: Payer: Self-pay | Admitting: Family Medicine

## 2019-05-27 DIAGNOSIS — N289 Disorder of kidney and ureter, unspecified: Secondary | ICD-10-CM

## 2019-05-27 DIAGNOSIS — E039 Hypothyroidism, unspecified: Secondary | ICD-10-CM

## 2019-05-27 MED ORDER — LEVOTHYROXINE SODIUM 150 MCG PO TABS: 150.0000 ug | ORAL_TABLET | Freq: Every day | ORAL | 0 refills | Status: DC

## 2019-05-27 NOTE — Telephone Encounter (Signed)
Please let her know that her labs are OK.  Her kidney function has gotten a bit worse and her thyroid is a bit over treated. I'd like her to really push the fluids and we'll recheck it in about 2 weeks to see how it's doing. I'd also like to drop her thyroid medicine from 145mcg to 150mg  and we'll check that in 6 weeks. Orders in for both. Thanks!

## 2019-05-27 NOTE — Telephone Encounter (Signed)
Attempted to call x 2, line busy. Will attempt again later.

## 2019-05-28 NOTE — Telephone Encounter (Signed)
Message relayed to patient. Verbalized understanding and denied questions. Both lab visits scheduled.

## 2019-06-02 ENCOUNTER — Other Ambulatory Visit: Payer: Self-pay | Admitting: Family Medicine

## 2019-06-03 ENCOUNTER — Ambulatory Visit: Payer: Medicare HMO | Admitting: Family Medicine

## 2019-06-11 ENCOUNTER — Other Ambulatory Visit: Payer: Self-pay

## 2019-06-11 ENCOUNTER — Other Ambulatory Visit: Payer: Medicare HMO

## 2019-06-11 DIAGNOSIS — N289 Disorder of kidney and ureter, unspecified: Secondary | ICD-10-CM | POA: Diagnosis not present

## 2019-06-11 DIAGNOSIS — E039 Hypothyroidism, unspecified: Secondary | ICD-10-CM | POA: Diagnosis not present

## 2019-06-12 LAB — BASIC METABOLIC PANEL
BUN/Creatinine Ratio: 25 (ref 12–28)
BUN: 40 mg/dL — ABNORMAL HIGH (ref 8–27)
CO2: 20 mmol/L (ref 20–29)
Calcium: 9.6 mg/dL (ref 8.7–10.3)
Chloride: 96 mmol/L (ref 96–106)
Creatinine, Ser: 1.62 mg/dL — ABNORMAL HIGH (ref 0.57–1.00)
GFR calc Af Amer: 34 mL/min/{1.73_m2} — ABNORMAL LOW (ref >59)
GFR calc non Af Amer: 30 mL/min/{1.73_m2} — ABNORMAL LOW (ref >59)
Glucose: 83 mg/dL (ref 65–99)
Potassium: 4.4 mmol/L (ref 3.5–5.2)
Sodium: 135 mmol/L (ref 134–144)

## 2019-06-12 LAB — TSH: TSH: 0.393 u[IU]/mL — ABNORMAL LOW (ref 0.450–4.500)

## 2019-06-23 ENCOUNTER — Telehealth: Payer: Self-pay | Admitting: Family Medicine

## 2019-06-23 DIAGNOSIS — E039 Hypothyroidism, unspecified: Secondary | ICD-10-CM

## 2019-06-23 MED ORDER — LEVOTHYROXINE SODIUM 100 MCG PO TABS: 100.0000 ug | ORAL_TABLET | Freq: Every day | ORAL | 1 refills | Status: DC

## 2019-06-23 NOTE — Telephone Encounter (Signed)
Please let her know that her labs look good, but her thyroid is still a bit over treated. I'd like her to drop her thyroid medicine to 18mcg and we;ll recheck it in 6 weeks. Thanks!

## 2019-06-24 NOTE — Telephone Encounter (Signed)
Patient notified, appt scheduled.

## 2019-07-01 ENCOUNTER — Ambulatory Visit: Payer: Medicare HMO

## 2019-07-01 NOTE — Progress Notes (Deleted)
Subjective:   Cathy Solis is a 78 y.o. female who presents for Medicare Annual (Subsequent) preventive examination.  This visit is being conducted via phone call  - after an attmept to do on video chat - due to the COVID-19 pandemic. This patient has given me verbal consent via phone to conduct this visit, patient states they are participating from their home address. Some vital signs may be absent or patient reported.   Patient identification: identified by name, DOB, and current address.    Review of Systems:        Objective:     Vitals: There were no vitals taken for this visit.  There is no height or weight on file to calculate BMI.  Advanced Directives 01/14/2019 01/14/2019 06/25/2018 05/04/2017 06/30/2015  Does Patient Have a Medical Advance Directive? Yes Yes Yes Yes Yes  Type of Industrial/product designer of [place];Living will [place];Living will [place]  Does patient want to make changes to medical advance directive? No - Patient declined No - Patient declined - - No - Patient declined  Copy of [place] in Chart? No - copy requested No - copy requested No - copy requested No - copy requested No - copy requested    Tobacco Social History   Tobacco Use  Smoking Status Current Some Day Smoker  . Packs/day: 1.00  . Years: 30.00  . Pack years: 30.00  . Types: Cigarettes  . Last attempt to quit: 01/08/2019  . Years since quitting: 0.4  Smokeless Tobacco Never Used     Ready to quit: Not Answered Counseling given: Not Answered   Clinical Intake:                       Past Medical History:  Diagnosis Date  . Breast cancer [facility]) 2006   Left breast, s/p radiation  . 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 Hypertension   . Kidney problem    Undeveloped R kidney  . Occlusion and stenosis of carotid artery without mention of cerebral  infarction   . Personal history of tobacco use, presenting hazards to health 03/18/2016  . Stroke [facility])    residual left sided weakness  . Toe infection    followed by Dr. Jens Som   Past Surgical History:  Procedure Laterality Date  . ABDOMINAL HYSTERECTOMY    . BLADDER REPAIR    . BREAST LUMPECTOMY Left   . BREAST SURGERY     left  . CAROTID ENDARTERECTOMY  2008   left  . COLONOSCOPY  2008  . EYE SURGERY Right 2013   cataract  . KIDNEY SURGERY  1949   Family History  Problem Relation Age of Onset  . Stroke Mother   . Heart disease Father   . Heart attack Maternal Grandfather    Social History   Socioeconomic History  . Marital status: Widowed    Spouse name: Not on file  . Number of children: Not on file  . Years of education: Not on file  . Highest education level: High school graduate  Occupational History  . Not on file  Social Needs  . Financial resource strain: Not hard at all  . Food insecurity    Worry: Never true    Inability: Never true  . Transportation needs    Medical: No    Non-medical: No  Tobacco Use  .  Smoking status: Current Some Day Smoker    Packs/day: 1.00    Years: 30.00    Pack years: 30.00    Types: Cigarettes    Last attempt to quit: 01/08/2019    Years since quitting: 0.4  . Smokeless tobacco: Never Used  Substance and Sexual Activity  . Alcohol use: No  . Drug use: No  . Sexual activity: Never  Lifestyle  . Physical activity    Days per week: 0 days    Minutes per session: 0 min  . Stress: Not at all  Relationships  . Social connections    Talks on phone: More than three times a week    Gets together: More than three times a week    Attends religious service: More than 4 times per year    Active member of club or organization: No    Attends meetings of clubs or organizations: Never    Relationship status: Widowed  Other Topics Concern  . Not on file  Social History Narrative  . Not on file    Outpatient Encounter  Medications as of 07/01/2019  Medication Sig  . albuterol (PROVENTIL HFA;VENTOLIN HFA) 108 (90 Base) MCG/ACT inhaler Inhale 2 puffs into the lungs every 6 (six) hours as needed for wheezing or shortness of breath. (Patient not taking: Reported on 04/25/2019)  . amLODipine (NORVASC) 10 MG tablet Take 1 tablet (10 mg total) by mouth daily.  . Fluticasone-Salmeterol (ADVAIR DISKUS) 250-50 MCG/DOSE AEPB Inhale 1 puff into the lungs 2 (two) times daily. (Patient not taking: Reported on 04/25/2019)  . ipratropium-albuterol (DUONEB) 0.5-2.5 (3) MG/3ML SOLN Take 3 mLs by nebulization 2 (two) times daily for 4 days. DuoNeb nebulizer treatments twice a day for 4 days followed by as needed for severe shortness of breath  . levothyroxine (SYNTHROID) 100 MCG tablet Take 1 tablet (100 mcg total) by mouth daily.  [name] lisinopril (ZESTRIL) 5 MG tablet TAKE 1 TABLET BY MOUTH EVERY DAY  . metoprolol tartrate (LOPRESSOR) 25 MG tablet Take 1 tablet (25 mg total) by mouth 2 (two) times daily.   No facility-administered encounter medications on file as of 07/01/2019.     Activities of Daily Living In your present state of health, do you have any difficulty performing the following activities: 01/14/2019  Hearing? N  Vision? N  Difficulty concentrating or making decisions? N  Walking or climbing stairs? Y  Dressing or bathing? Y  Doing errands, shopping? N  Some recent data might be hidden    Patient Care Team: Valerie Roys, DO as PCP - General (Family Medicine) Christene Lye, MD (General Surgery)    Assessment:   This is a routine wellness examination for Cathy Solis.  Exercise Activities and Dietary recommendations    Goals    . Quit Smoking     Smoking cessation discussed    . Quit smoking / using tobacco      Decrease the amount of cigarettes smoked per day. Currently smokes 20 per day.  Decrease to 15, then to 10, then to 5, then to 2, then to 1 per day. Then to quit.    . Quit smoking / using  tobacco     Discussed smoking cessation        Fall Risk: Fall Risk  06/25/2018 05/04/2017 12/09/2016 05/23/2016 06/30/2015  Falls in the past year? No No No No No    FALL RISK PREVENTION PERTAINING TO THE HOME:  Any stairs in or around the home? {YES/NO:21197} If  so, are there any without handrails? {YES/NO:21197}  Home free of loose throw rugs in walkways, pet beds, electrical cords, etc? {YES/NO:21197} Adequate lighting in your home to reduce risk of falls? {YES/NO:21197}  ASSISTIVE DEVICES UTILIZED TO PREVENT FALLS:  Life alert? {YES/NO:21197} Use of a cane, walker or w/c? {YES/NO:21197} Grab bars in the bathroom? {YES/NO:21197} Shower chair or bench in shower? {YES/NO:21197} Elevated toilet seat or a handicapped toilet? {YES/NO:21197}  DME ORDERS:  DME order needed?  {YES/NO:21197}  TIMED UP AND GO:  Unable to perform    Depression Screen PHQ 2/9 Scores 06/25/2018 05/04/2017 12/09/2016 05/23/2016  PHQ - 2 Score 0 0 0 0     Cognitive Function MMSE - Mini Mental State Exam 06/30/2015  Orientation to time 5  Orientation to Place 5  Registration 3  Attention/ Calculation 5  Recall 3  Language- name 2 objects 2  Language- repeat 1  Language- follow 3 step command 3  Language- read & follow direction 1  Write a sentence 1  Copy design 1  Total score 30     6CIT Screen 06/25/2018 05/04/2017  What Year? 0 points 0 points  What month? 0 points 0 points  What time? 0 points 0 points  Count back from 20 0 points 0 points  Months in reverse 0 points 0 points  Repeat phrase 0 points 0 points  Total Score 0 0    Immunization History  Administered Date(s) Administered  . Influenza Split 08/05/2013  . Influenza, High Dose Seasonal PF 08/01/2018  . Influenza-Unspecified 08/11/2015, 09/02/2016, 08/14/2017  . Pneumococcal Conjugate-13 11/28/2014  . Pneumococcal Polysaccharide-23 06/26/2012  . Tdap 06/26/2012    Qualifies for Shingles Vaccine? {YES/NO:21197} Zostavax  completed ***. Due for Shingrix. Education has been provided regarding the importance of this vaccine. Pt has been advised to call insurance company to determine out of pocket expense. Advised may also receive vaccine at local pharmacy or Health Dept. Verbalized acceptance and understanding.  Tdap: Although this vaccine is not a covered service during a Wellness Exam, does the patient still wish to receive this vaccine today?  {YES/NO:21197}.  Education has been provided regarding the importance of this vaccine. Advised may receive this vaccine at local pharmacy or Health Dept. Aware to provide a copy of the vaccination record if obtained from local pharmacy or Health Dept. Verbalized acceptance and understanding.  Flu Vaccine: Due for Flu vaccine. Does the patient want to receive this vaccine today?  {YES/NO:21197}. Education has been provided regarding the importance of this vaccine but still declined. Advised may receive this vaccine at local pharmacy or Health Dept. Aware to provide a copy of the vaccination record if obtained from local pharmacy or Health Dept. Verbalized acceptance and understanding.  Pneumococcal Vaccine: Due for Pneumococcal vaccine. Does the patient want to receive this vaccine today?  {YES/NO:21197}. Education has been provided regarding the importance of this vaccine but still declined. Advised may receive this vaccine at local pharmacy or Health Dept. Aware to provide a copy of the vaccination record if obtained from local pharmacy or Health Dept. Verbalized acceptance and understanding.   Screening Tests Health Maintenance  Topic Date Due  . INFLUENZA VACCINE  06/22/2019  . TETANUS/TDAP  06/26/2022  . DEXA SCAN  Completed  . PNA vac Low Risk Adult  Completed    Cancer Screenings:  Colorectal Screening: no longer required   Mammogram: no longer required   Bone Density: Completed 09/13/2017. Results reflect NORMAL, OSTEOPENIA, OSTEOPOROSIS. Repeat every 2 years.  Ordered ***. Pt provided with contact info and advised to call to schedule appt. Pt aware the office will call re: appt.  Lung Cancer Screening: (Low Dose CT Chest recommended if Age 78-80 years, 30 pack-year currently smoking OR have quit w/in 15years.) {DOES NOT does:27190::"does not"} qualify.   Lung Cancer Screening Referral: An Epic message has been sent to Burgess Estelle, RN (Oncology Nurse Navigator) regarding the possible need for this exam. Raquel Sarna will review the patient's chart to determine if the patient truly qualifies for the exam. If the patient qualifies, Raquel Sarna will order the Low Dose CT of the chest to facilitate the scheduling of this exam.  Additional Screening:  Hepatitis C Screening: does not qualify  Vision Screening: Recommended annual ophthalmology exams for early detection of glaucoma and other disorders of the eye. Is the patient up to date with their annual eye exam?  {YES/NO:21197} Who is the provider or what is the name of the office in which the pt attends annual eye exams? *** If pt is not established with a provider, would they like to be referred to a provider to establish care? {YES/NO:21197}. Ophthalmology referral has been placed. Pt aware the office will call re: appt.  Dental Screening: Recommended annual dental exams for proper oral hygiene  Community Resource Referral:  CRR required this visit?  {YES/NO:21197}      Plan:  I have personally reviewed and addressed the Medicare Annual Wellness questionnaire and have noted the following in the patient's chart:  A. Medical and social history B. Use of alcohol, tobacco or illicit drugs  C. Current medications and supplements D. Functional ability and status E.  Nutritional status F.  Physical activity G. Advance directives H. List of other physicians I.  Hospitalizations, surgeries, and ER visits in previous 12 months J.  [place] such as hearing and vision if needed, cognitive and  depression L. Referrals and appointments   In addition, I have reviewed and discussed with patient certain preventive protocols, quality metrics, and best practice recommendations. A written personalized care plan for preventive services as well as general preventive health recommendations were provided to patient.  Signed,    Bevelyn Ngo, LPN  3/[id number]/2947 Nurse Health Advisor   Nurse Notes:

## 2019-07-09 ENCOUNTER — Other Ambulatory Visit: Payer: Self-pay

## 2019-07-09 ENCOUNTER — Other Ambulatory Visit: Payer: Medicare HMO

## 2019-07-09 DIAGNOSIS — E039 Hypothyroidism, unspecified: Secondary | ICD-10-CM | POA: Diagnosis not present

## 2019-07-10 LAB — TSH: TSH: 3.35 u[IU]/mL (ref 0.450–4.500)

## 2019-07-17 ENCOUNTER — Telehealth: Payer: Self-pay | Admitting: Family Medicine

## 2019-07-17 MED ORDER — LEVOTHYROXINE SODIUM 100 MCG PO TABS: 100.0000 ug | ORAL_TABLET | Freq: Every day | ORAL | 3 refills | Status: DC

## 2019-07-17 NOTE — Telephone Encounter (Signed)
Patient notified

## 2019-07-17 NOTE — Telephone Encounter (Signed)
Please let her know that her thyroid came back normal. I've sent refills to her pharmacy. Thanks! 

## 2019-08-01 ENCOUNTER — Telehealth: Payer: Self-pay | Admitting: Family Medicine

## 2019-08-01 NOTE — Telephone Encounter (Signed)
Pt presented in office this morning for lab work. She is scheduled for labs on Monday 9/14 but no orders are in. She is also scheduled for fu on 9/29. Does pt need to come in for labs on 9/14?

## 2019-08-02 DIAGNOSIS — R69 Illness, unspecified: Secondary | ICD-10-CM | POA: Diagnosis not present

## 2019-08-05 ENCOUNTER — Other Ambulatory Visit: Payer: Medicare HMO

## 2019-08-05 ENCOUNTER — Other Ambulatory Visit: Payer: Self-pay

## 2019-08-05 DIAGNOSIS — N289 Disorder of kidney and ureter, unspecified: Secondary | ICD-10-CM | POA: Diagnosis not present

## 2019-08-06 ENCOUNTER — Telehealth: Payer: Self-pay | Admitting: Family Medicine

## 2019-08-06 LAB — BASIC METABOLIC PANEL
BUN/Creatinine Ratio: 17 (ref 12–28)
BUN: 26 mg/dL (ref 8–27)
CO2: 25 mmol/L (ref 20–29)
Calcium: 10 mg/dL (ref 8.7–10.3)
Chloride: 96 mmol/L (ref 96–106)
Creatinine, Ser: 1.57 mg/dL — ABNORMAL HIGH (ref 0.57–1.00)
GFR calc Af Amer: 36 mL/min/{1.73_m2} — ABNORMAL LOW (ref >59)
GFR calc non Af Amer: 31 mL/min/{1.73_m2} — ABNORMAL LOW (ref >59)
Glucose: 116 mg/dL — ABNORMAL HIGH (ref 65–99)
Potassium: 3.8 mmol/L (ref 3.5–5.2)
Sodium: 137 mmol/L (ref 134–144)

## 2019-08-06 NOTE — Telephone Encounter (Signed)
Please let her  Know that her kidney function got a little better. Keep drinking the water and we'll see her in 2 weeks.

## 2019-08-06 NOTE — Telephone Encounter (Signed)
Patient notified and verbalized understanding. 

## 2019-08-06 NOTE — Telephone Encounter (Signed)
Pt came in and done labs yesterday.

## 2019-08-06 NOTE — Telephone Encounter (Signed)
She does not. OK to just be seen 9/29

## 2019-08-19 ENCOUNTER — Other Ambulatory Visit: Payer: Self-pay | Admitting: Family Medicine

## 2019-08-19 NOTE — Telephone Encounter (Signed)
Forwarding medication refill request to PCP for review. 

## 2019-08-20 ENCOUNTER — Ambulatory Visit (INDEPENDENT_AMBULATORY_CARE_PROVIDER_SITE_OTHER): Payer: Medicare HMO | Admitting: Family Medicine

## 2019-08-20 ENCOUNTER — Other Ambulatory Visit: Payer: Self-pay

## 2019-08-20 ENCOUNTER — Encounter: Payer: Self-pay | Admitting: Family Medicine

## 2019-08-20 VITALS — BP 137/74 | HR 76 | Temp 97.7°F | Ht 65.0 in | Wt 115.0 lb

## 2019-08-20 DIAGNOSIS — N183 Chronic kidney disease, stage 3 unspecified: Secondary | ICD-10-CM | POA: Insufficient documentation

## 2019-08-20 DIAGNOSIS — I129 Hypertensive chronic kidney disease with stage 1 through stage 4 chronic kidney disease, or unspecified chronic kidney disease: Secondary | ICD-10-CM

## 2019-08-20 DIAGNOSIS — J42 Unspecified chronic bronchitis: Secondary | ICD-10-CM | POA: Diagnosis not present

## 2019-08-20 DIAGNOSIS — E782 Mixed hyperlipidemia: Secondary | ICD-10-CM | POA: Diagnosis not present

## 2019-08-20 DIAGNOSIS — I10 Essential (primary) hypertension: Secondary | ICD-10-CM | POA: Diagnosis not present

## 2019-08-20 DIAGNOSIS — Q600 Renal agenesis, unilateral: Secondary | ICD-10-CM

## 2019-08-20 DIAGNOSIS — E039 Hypothyroidism, unspecified: Secondary | ICD-10-CM | POA: Diagnosis not present

## 2019-08-20 DIAGNOSIS — D510 Vitamin B12 deficiency anemia due to intrinsic factor deficiency: Secondary | ICD-10-CM

## 2019-08-20 DIAGNOSIS — IMO0002 Reserved for concepts with insufficient information to code with codable children: Secondary | ICD-10-CM | POA: Insufficient documentation

## 2019-08-20 DIAGNOSIS — R69 Illness, unspecified: Secondary | ICD-10-CM | POA: Diagnosis not present

## 2019-08-20 MED ORDER — AMLODIPINE BESYLATE 10 MG PO TABS: 10.0000 mg | ORAL_TABLET | Freq: Every day | ORAL | 1 refills | Status: DC

## 2019-08-20 MED ORDER — FLUTICASONE-SALMETEROL 250-50 MCG/DOSE IN AEPB: 1.0000 | INHALATION_SPRAY | Freq: Two times a day (BID) | RESPIRATORY_TRACT | 1 refills | Status: DC

## 2019-08-20 MED ORDER — METOPROLOL TARTRATE 25 MG PO TABS: 25.0000 mg | ORAL_TABLET | Freq: Two times a day (BID) | ORAL | 1 refills | Status: DC

## 2019-08-20 MED ORDER — IPRATROPIUM-ALBUTEROL 0.5-2.5 (3) MG/3ML IN SOLN: 3.0000 mL | Freq: Two times a day (BID) | RESPIRATORY_TRACT | 1 refills | Status: DC

## 2019-08-20 MED ORDER — LISINOPRIL 5 MG PO TABS: ORAL_TABLET | ORAL | 1 refills | Status: DC

## 2019-08-20 NOTE — Assessment & Plan Note (Signed)
Under good control on current regimen. Continue current regimen. Continue to monitor. Call with any concerns. Refills given. Labs drawn today.   

## 2019-08-20 NOTE — Assessment & Plan Note (Signed)
Rechecking levels today- given that renal function has been creeping up and her solitary kidney, will get her into see nephrology. Call with any concerns.

## 2019-08-20 NOTE — Assessment & Plan Note (Signed)
Checking labs today. Await results. Treat as needed.  

## 2019-08-20 NOTE — Assessment & Plan Note (Addendum)
Renal referral today due to creeping up renal function. Call with any concerns.

## 2019-08-20 NOTE — Assessment & Plan Note (Signed)
Rechecking levels today. Will treat as needed. Call with any concerns. Continue to monitor.

## 2019-08-20 NOTE — Progress Notes (Signed)
BP 137/74   Pulse 76   Temp 97.7 F (36.5 C) (Oral)   Ht 5\' 5"  (1.651 m)   Wt 115 lb (52.2 kg)   SpO2 100%   BMI 19.14 kg/m    Subjective:    Patient ID: Cathy Solis, female    DOB: [birth year], 77 y.o.   MRN: [id number]  HPI: Cathy Solis is a 78 y.o. female  Chief Complaint  Patient presents with  . Hypertension    92m f/u  . Hyperlipidemia  . Hypothyroidism   HYPERTENSION / HYPERLIPIDEMIA Satisfied with current treatment? yes Duration of hypertension: chronic BP monitoring frequency: not checking BP medication side effects: no Past BP meds: lisinopril, metoprolol, amlodipine Duration of hyperlipidemia: chronic Cholesterol medication side effects: not on anything Cholesterol supplements: none Past cholesterol medications: none Medication compliance: excellent compliance Aspirin: no Recent stressors: no Recurrent headaches: no Visual changes: no Palpitations: no Dyspnea: no Chest pain: no Lower extremity edema: no Dizzy/lightheaded: no  HYPOTHYROIDISM Thyroid control status:controlled Satisfied with current treatment? yes Medication side effects: no Medication compliance: excellent compliance Recent dose adjustment:yes Fatigue: no Cold intolerance: no Heat intolerance: no Weight gain: no Weight loss: no Constipation: no Diarrhea/loose stools: no Palpitations: no Lower extremity edema: no Anxiety/depressed mood: no  Relevant past medical, surgical, family and social history reviewed and updated as indicated. Interim medical history since our last visit reviewed. Allergies and medications reviewed and updated.  Review of Systems  Constitutional: Negative.   Respiratory: Negative.   Cardiovascular: Negative.   Gastrointestinal: Negative.   Neurological: Negative.   Psychiatric/Behavioral: Negative.     Per HPI unless specifically indicated above     Objective:    BP 137/74   Pulse 76   Temp 97.7 F (36.5 C) (Oral)   Ht 5\' 5"  (1.651  m)   Wt 115 lb (52.2 kg)   SpO2 100%   BMI 19.14 kg/m   Wt Readings from Last 3 Encounters:  08/20/19 115 lb (52.2 kg)  05/21/19 110 lb (49.9 kg)  05/09/19 111 lb (50.3 kg)    Physical Exam Vitals signs and nursing note reviewed.  Constitutional:      General: She is not in acute distress.    Appearance: Normal appearance. She is not ill-appearing, toxic-appearing or diaphoretic.  HENT:     Head: Normocephalic and atraumatic.     Right Ear: External ear normal.     Left Ear: External ear normal.     Nose: Nose normal.     Mouth/Throat:     Mouth: Mucous membranes are moist.     Pharynx: Oropharynx is clear.  Eyes:     General: No scleral icterus.       Right eye: No discharge.        Left eye: No discharge.     Extraocular Movements: Extraocular movements intact.     Conjunctiva/sclera: Conjunctivae normal.     Pupils: Pupils are equal, round, and reactive to light.  Neck:     Musculoskeletal: Normal range of motion and neck supple.  Cardiovascular:     Rate and Rhythm: Normal rate and regular rhythm.     Pulses: Normal pulses.     Heart sounds: Normal heart sounds. No murmur. No friction rub. No gallop.   Pulmonary:     Effort: Pulmonary effort is normal. No respiratory distress.     Breath sounds: Normal breath sounds. No stridor. No wheezing, rhonchi or rales.  Chest:     Chest wall:  No tenderness.  Musculoskeletal: Normal range of motion.  Skin:    General: Skin is warm and dry.     Capillary Refill: Capillary refill takes less than 2 seconds.     Coloration: Skin is not jaundiced or pale.     Findings: No bruising, erythema, lesion or rash.  Neurological:     General: No focal deficit present.     Mental Status: She is alert and oriented to person, place, and time. Mental status is at baseline.  Psychiatric:        Mood and Affect: Mood normal.        Behavior: Behavior normal.        Thought Content: Thought content normal.        Judgment: Judgment normal.      Results for orders placed or performed in visit on 123XX123  Basic metabolic panel  Result Value Ref Range   Glucose 116 (H) 65 - 99 mg/dL   BUN 26 8 - 27 mg/dL   Creatinine, Ser 1.57 (H) 0.57 - 1.00 mg/dL   GFR calc non Af Amer 31 (L) >59 mL/min/1.73   GFR calc Af Amer 36 (L) >59 mL/min/1.73   BUN/Creatinine Ratio 17 12 - 28   Sodium 137 134 - 144 mmol/L   Potassium 3.8 3.5 - 5.2 mmol/L   Chloride 96 96 - 106 mmol/L   CO2 25 20 - 29 mmol/L   Calcium 10.0 8.7 - 10.3 mg/dL      Assessment & Plan:   Problem List Items Addressed This Visit      Respiratory   COPD exacerbation ([place])    Under good control on current regimen. Continue current regimen. Continue to monitor. Call with any concerns. Refills given.        Relevant Medications   ipratropium-albuterol (DUONEB) 0.5-2.5 (3) MG/3ML SOLN   Fluticasone-Salmeterol (ADVAIR DISKUS) 250-50 MCG/DOSE AEPB     Endocrine   Hypothyroidism - Primary    Rechecking levels today. Will treat as needed. Call with any concerns. Continue to monitor.       Relevant Medications   metoprolol tartrate (LOPRESSOR) 25 MG tablet     Genitourinary   Benign hypertensive renal disease    Under good control on current regimen. Continue current regimen. Continue to monitor. Call with any concerns. Refills given. Labs drawn today.       Relevant Orders   Comprehensive metabolic panel   Ambulatory referral to Nephrology   Chronic kidney disease (CKD), stage III (moderate)    Rechecking levels today- given that renal function has been creeping up and her solitary kidney, will get her into see nephrology. Call with any concerns.       Relevant Orders   Ambulatory referral to Nephrology     Other   Hyperlipidemia    Under good control on current regimen. Continue current regimen. Continue to monitor. Call with any concerns. Refills given. Labs drawn today.       Relevant Medications   metoprolol tartrate (LOPRESSOR) 25 MG tablet    lisinopril (ZESTRIL) 5 MG tablet   amLODipine (NORVASC) 10 MG tablet   Other Relevant Orders   Comprehensive metabolic panel   Lipid Panel w/o Chol/HDL Ratio   Pernicious anemia    Checking labs today. Await results. Treat as needed.       Relevant Orders   CBC with Differential/Platelet   Solitary kidney    Renal referral today due to creeping up renal function. Call with any  concerns.       Relevant Orders   Ambulatory referral to Nephrology    Other Visit Diagnoses    Essential hypertension       Relevant Medications   metoprolol tartrate (LOPRESSOR) 25 MG tablet   lisinopril (ZESTRIL) 5 MG tablet   amLODipine (NORVASC) 10 MG tablet       Follow up plan: Return in about 3 months (around 11/19/2019).

## 2019-08-20 NOTE — Assessment & Plan Note (Signed)
Under good control on current regimen. Continue current regimen. Continue to monitor. Call with any concerns. Refills given.   

## 2019-08-21 LAB — LIPID PANEL W/O CHOL/HDL RATIO
Cholesterol, Total: 257 mg/dL — ABNORMAL HIGH (ref 100–199)
HDL: 76 mg/dL (ref >39)
LDL Chol Calc (NIH): 156 mg/dL — ABNORMAL HIGH (ref 0–99)
Triglycerides: 140 mg/dL (ref 0–149)
VLDL Cholesterol Cal: 25 mg/dL (ref 5–40)

## 2019-08-21 LAB — CBC WITH DIFFERENTIAL/PLATELET
Basophils Absolute: 0.1 10*3/uL (ref 0.0–0.2)
Basos: 1 %
EOS (ABSOLUTE): 0.1 10*3/uL (ref 0.0–0.4)
Eos: 2 %
Hematocrit: 40.2 % (ref 34.0–46.6)
Hemoglobin: 13.7 g/dL (ref 11.1–15.9)
Immature Grans (Abs): 0 10*3/uL (ref 0.0–0.1)
Immature Granulocytes: 1 %
Lymphocytes Absolute: 2.1 10*3/uL (ref 0.7–3.1)
Lymphs: 29 %
MCH: 32 pg (ref 26.6–33.0)
MCHC: 34.1 g/dL (ref 31.5–35.7)
MCV: 94 fL (ref 79–97)
Monocytes Absolute: 0.7 10*3/uL (ref 0.1–0.9)
Monocytes: 9 %
Neutrophils Absolute: 4.4 10*3/uL (ref 1.4–7.0)
Neutrophils: 58 %
Platelets: 176 10*3/uL (ref 150–450)
RBC: 4.28 x10E6/uL (ref 3.77–5.28)
RDW: 12.7 % (ref 11.7–15.4)
WBC: 7.4 10*3/uL (ref 3.4–10.8)

## 2019-08-21 LAB — COMPREHENSIVE METABOLIC PANEL
ALT: 13 IU/L (ref 0–32)
AST: 18 IU/L (ref 0–40)
Albumin/Globulin Ratio: 1.5 (ref 1.2–2.2)
Albumin: 3.8 g/dL (ref 3.7–4.7)
Alkaline Phosphatase: 75 IU/L (ref 39–117)
BUN/Creatinine Ratio: 23 (ref 12–28)
BUN: 34 mg/dL — ABNORMAL HIGH (ref 8–27)
Bilirubin Total: <0.2 mg/dL (ref 0.0–1.2)
CO2: 24 mmol/L (ref 20–29)
Calcium: 9.3 mg/dL (ref 8.7–10.3)
Chloride: 96 mmol/L (ref 96–106)
Creatinine, Ser: 1.47 mg/dL — ABNORMAL HIGH (ref 0.57–1.00)
GFR calc Af Amer: 39 mL/min/{1.73_m2} — ABNORMAL LOW (ref >59)
GFR calc non Af Amer: 33 mL/min/{1.73_m2} — ABNORMAL LOW (ref >59)
Globulin, Total: 2.6 g/dL (ref 1.5–4.5)
Glucose: 83 mg/dL (ref 65–99)
Potassium: 4 mmol/L (ref 3.5–5.2)
Sodium: 135 mmol/L (ref 134–144)
Total Protein: 6.4 g/dL (ref 6.0–8.5)

## 2019-08-22 ENCOUNTER — Telehealth: Payer: Self-pay

## 2019-08-22 NOTE — Telephone Encounter (Signed)
PA for Prolia placed on Dr. Jodelle Green desk awaiting signature

## 2019-08-29 ENCOUNTER — Ambulatory Visit: Payer: Medicare HMO | Admitting: Internal Medicine

## 2019-08-30 ENCOUNTER — Telehealth: Payer: Self-pay

## 2019-08-30 NOTE — Telephone Encounter (Signed)
Called patient today to get her a f/u appointment scheduled so that I can proceed with Prolia-patient has declined a visit stating she is not leaving her home for any reason right now, and was told by [facility] to [employer]- just [employer]

## 2019-10-07 ENCOUNTER — Ambulatory Visit (INDEPENDENT_AMBULATORY_CARE_PROVIDER_SITE_OTHER): Payer: Medicare HMO

## 2019-10-07 ENCOUNTER — Other Ambulatory Visit: Payer: Self-pay

## 2019-10-07 VITALS — BP 127/54 | HR 57 | Resp 15 | Ht 65.0 in | Wt 115.6 lb

## 2019-10-07 DIAGNOSIS — Z0000 Encounter for general adult medical examination without abnormal findings: Secondary | ICD-10-CM | POA: Diagnosis not present

## 2019-10-07 NOTE — Patient Instructions (Addendum)
Cathy Solis , Thank you for taking time to come for your Medicare Wellness Visit. I appreciate your ongoing commitment to your health goals. Please review the following plan we discussed and let me know if I can assist you in the future.   Screening recommendations/referrals: Colonoscopy: no  Longer required Mammogram: no longer required Bone Density: no longer required Recommended yearly ophthalmology/optometry visit for glaucoma screening and checkup Recommended yearly dental visit for hygiene and checkup  Vaccinations: Influenza vaccine: up to date  Pneumococcal vaccine: up to date Tdap vaccine: up to date Shingles vaccine: not indicated     Advanced directives: Please bring a copy of your health care power of attorney and living will to the office at your convenience.  Conditions/risks identified: If you wish to quit smoking, help is available. For free tobacco cessation program offerings call the [facility] at [phone])480-4258 or Live Well Line at ([phone])510-1176. You may also visit [email or web address] or email [email or web address] for more information on other programs.   Next appointment: Follow up in one year for your annual wellness visit.    Preventive Care 80 Years and Older, Female Preventive care refers to lifestyle choices and visits with your health care provider that can promote health and wellness. What does preventive care include?  A yearly physical exam. This is also called an annual well check.  Dental exams once or twice a year.  Routine eye exams. Ask your health care provider how often you should have your eyes checked.  Personal lifestyle choices, including:  Daily care of your teeth and gums.  Regular physical activity.  Eating a healthy diet.  Avoiding tobacco and drug use.  Limiting alcohol use.  Practicing safe sex.  Taking low-dose aspirin every day.  Taking vitamin and mineral supplements as recommended by your health  care provider. What happens during an annual well check? The services and screenings done by your health care provider during your annual well check will depend on your age, overall health, lifestyle risk factors, and family history of disease. Counseling  Your health care provider may ask you questions about your:  Alcohol use.  Tobacco use.  Drug use.  Emotional well-being.  Home and relationship well-being.  Sexual activity.  Eating habits.  History of falls.  Memory and ability to understand (cognition).  Work and work Statistician.  Reproductive health. Screening  You may have the following tests or measurements:  Height, weight, and BMI.  Blood pressure.  Lipid and cholesterol levels. These may be checked every 5 years, or more frequently if you are over 47 years old.  Skin check.  Lung cancer screening. You may have this screening every year starting at age 80 if you have a 30-pack-year history of smoking and currently smoke or have quit within the past 80 years.  Fecal occult blood test (FOBT) of the stool. You may have this test every year starting at age 80.  Flexible sigmoidoscopy or colonoscopy. You may have a sigmoidoscopy every 5 years or a colonoscopy every 10 years starting at age 80.  Hepatitis C blood test.  Hepatitis B blood test.  Sexually transmitted disease (STD) testing.  Diabetes screening. This is done by checking your blood sugar (glucose) after you have not eaten for a while (fasting). You may have this done every 1-3 years.  Bone density scan. This is done to screen for osteoporosis. You may have this done starting at age 80.  Mammogram. This may be done every 1-2  years. Talk to your health care provider about how often you should have regular mammograms. Talk with your health care provider about your test results, treatment options, and if necessary, the need for more tests. Vaccines  Your health care provider may recommend certain  vaccines, such as:  Influenza vaccine. This is recommended every year.  Tetanus, diphtheria, and acellular pertussis (Tdap, Td) vaccine. You may need a Td booster every 10 years.  Zoster vaccine. You may need this after age 80.  Pneumococcal 13-valent conjugate (PCV13) vaccine. One dose is recommended after age 45.  Pneumococcal polysaccharide (PPSV23) vaccine. One dose is recommended after age 60. Talk to your health care provider about which screenings and vaccines you need and how often you need them. This information is not intended to replace advice given to you by your health care provider. Make sure you discuss any questions you have with your health care provider. Document Released: 12/04/2015 Document Revised: 07/27/2016 Document Reviewed: 09/08/2015 Elsevier Interactive Patient Education  2017 [place] Prevention in the Home Falls can cause injuries. They can happen to people of all ages. There are many things you can do to make your home safe and to help prevent falls. What can I do on the outside of my home?  Regularly fix the edges of walkways and driveways and fix any cracks.  Remove anything that might make you trip as you walk through a door, such as a raised step or threshold.  Trim any bushes or trees on the path to your home.  Use bright outdoor lighting.  Clear any walking paths of anything that might make someone trip, such as rocks or tools.  Regularly check to see if handrails are loose or broken. Make sure that both sides of any steps have handrails.  Any raised decks and porches should have guardrails on the edges.  Have any leaves, snow, or ice cleared regularly.  Use sand or salt on walking paths during winter.  Clean up any spills in your garage right away. This includes oil or grease spills. What can I do in the bathroom?  Use night lights.  Install grab bars by the toilet and in the tub and shower. Do not use towel bars as grab  bars.  Use non-skid mats or decals in the tub or shower.  If you need to sit down in the shower, use a plastic, non-slip stool.  Keep the floor dry. Clean up any water that spills on the floor as soon as it happens.  Remove soap buildup in the tub or shower regularly.  Attach bath mats securely with double-sided non-slip rug tape.  Do not have throw rugs and other things on the floor that can make you trip. What can I do in the bedroom?  Use night lights.  Make sure that you have a light by your bed that is easy to reach.  Do not use any sheets or blankets that are too big for your bed. They should not hang down onto the floor.  Have a firm chair that has side arms. You can use this for support while you get dressed.  Do not have throw rugs and other things on the floor that can make you trip. What can I do in the kitchen?  Clean up any spills right away.  Avoid walking on wet floors.  Keep items that you use a lot in easy-to-reach places.  If you need to reach something above you, use a strong step  stool that has a grab bar.  Keep electrical cords out of the way.  Do not use floor polish or wax that makes floors slippery. If you must use wax, use non-skid floor wax.  Do not have throw rugs and other things on the floor that can make you trip. What can I do with my stairs?  Do not leave any items on the stairs.  Make sure that there are handrails on both sides of the stairs and use them. Fix handrails that are broken or loose. Make sure that handrails are as long as the stairways.  Check any carpeting to make sure that it is firmly attached to the stairs. Fix any carpet that is loose or worn.  Avoid having throw rugs at the top or bottom of the stairs. If you do have throw rugs, attach them to the floor with carpet tape.  Make sure that you have a light switch at the top of the stairs and the bottom of the stairs. If you do not have them, ask someone to add them for  you. What else can I do to help prevent falls?  Wear shoes that:  Do not have high heels.  Have rubber bottoms.  Are comfortable and fit you well.  Are closed at the toe. Do not wear sandals.  If you use a stepladder:  Make sure that it is fully opened. Do not climb a closed stepladder.  Make sure that both sides of the stepladder are locked into place.  Ask someone to hold it for you, if possible.  Clearly mark and make sure that you can see:  Any grab bars or handrails.  First and last steps.  Where the edge of each step is.  Use tools that help you move around (mobility aids) if they are needed. These include:  Canes.  Walkers.  Scooters.  Crutches.  Turn on the lights when you go into a dark area. Replace any light bulbs as soon as they burn out.  Set up your furniture so you have a clear path. Avoid moving your furniture around.  If any of your floors are uneven, fix them.  If there are any pets around you, be aware of where they are.  Review your medicines with your doctor. Some medicines can make you feel dizzy. This can increase your chance of falling. Ask your doctor what other things that you can do to help prevent falls. This information is not intended to replace advice given to you by your health care provider. Make sure you discuss any questions you have with your health care provider. Document Released: 09/03/2009 Document Revised: 04/14/2016 Document Reviewed: 12/12/2014 Elsevier Interactive Patient Education  2017 [employer].

## 2019-10-07 NOTE — Progress Notes (Signed)
Subjective:   Cathy Solis is a 78 y.o. female who presents for Medicare Annual (Subsequent) preventive examination.  Review of Systems:  Cardiac Risk Factors include: advanced age (>2men, >67 women);dyslipidemia;hypertension;smoking/ tobacco exposure     Objective:     Vitals: BP (!) 127/54 (BP Location: Left Arm, Patient Position: Sitting, Cuff Size: Normal)   Pulse (!) 57   Resp 15   Ht 5\' 5"  (1.651 m)   Wt 115 lb 9.6 oz (52.4 kg)   SpO2 97%   BMI 19.24 kg/m   Body mass index is 19.24 kg/m.  Advanced Directives 10/07/2019 01/14/2019 01/14/2019 06/25/2018 05/04/2017 06/30/2015  Does Patient Have a Medical Advance Directive? No;Yes Yes Yes Yes Yes Yes  Type of Advance Directive Living will;Healthcare Power of [place];Living will [place];Living will [place]  Does patient want to make changes to medical advance directive? - No - Patient declined No - Patient declined - - No - Patient declined  Copy of [place] in Chart? No - copy requested No - copy requested No - copy requested No - copy requested No - copy requested No - copy requested    Tobacco Social History   Tobacco Use  Smoking Status Current Some Day Smoker  . Packs/day: 0.50  . Years: 30.00  . Pack years: 15.00  . Types: Cigarettes  . Last attempt to quit: 01/08/2019  . Years since quitting: 0.7  Smokeless Tobacco Never Used     Ready to quit: Yes Counseling given: Yes   Clinical Intake:  Pre-visit preparation completed: Yes  Pain : No/denies pain     Nutritional Status: BMI of 19-24  Normal Nutritional Risks: None Diabetes: No  How often do you need to have someone help you when you read instructions, pamphlets, or other written materials from your doctor or pharmacy?: 1 - Never  Interpreter Needed?: No  Information entered by :: Vincenzina Jagoda,LPN   Past Medical History:  Diagnosis Date  . Breast cancer [facility]) 2006   Left breast, s/p radiation  . 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 Hypertension   . Kidney problem    Undeveloped R kidney  . 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 Personal history of tobacco use, presenting hazards to health 03/18/2016  . Stroke [facility])    residual left sided weakness  . Toe infection    followed by Dr. Jens Som   Past Surgical History:  Procedure Laterality Date  . ABDOMINAL HYSTERECTOMY    . BLADDER REPAIR    . BREAST LUMPECTOMY Left   . BREAST SURGERY     left  . CAROTID ENDARTERECTOMY  2008   left  . COLONOSCOPY  2008  . EYE SURGERY Right 2013   cataract  . KIDNEY SURGERY  1949   Family History  Problem Relation Age of Onset  . Stroke Mother   . Heart disease Father   . Heart attack Maternal Grandfather    Social History   Socioeconomic History  . Marital status: Widowed    Spouse name: Not on file  . Number of children: Not on file  . Years of education: Not on file  . Highest education level: High school graduate  Occupational History  . Not on file  Social Needs  . Financial resource strain: Not hard at all  . Food insecurity  Worry: Never true    Inability: Never true  . Transportation needs    Medical: No    Non-medical: No  Tobacco Use  . Smoking status: Current Some Day Smoker    Packs/day: 0.50    Years: 30.00    Pack years: 15.00    Types: Cigarettes    Last attempt to quit: 01/08/2019    Years since quitting: 0.7  . Smokeless tobacco: Never Used  Substance and Sexual Activity  . Alcohol use: No  . Drug use: No  . Sexual activity: Never  Lifestyle  . Physical activity    Days per week: 0 days    Minutes per session: 0 min  . Stress: Not at all  Relationships  . Social connections    Talks on phone: More than three times a week    Gets together: More than three times a week    Attends religious service: More than  4 times per year    Active member of club or organization: No    Attends meetings of clubs or organizations: Never    Relationship status: Widowed  Other Topics Concern  . Not on file  Social History Narrative  . Not on file    Outpatient Encounter Medications as of 10/07/2019  Medication Sig  . amLODipine (NORVASC) 10 MG tablet Take 1 tablet (10 mg total) by mouth daily.  [name] levothyroxine (SYNTHROID) 100 MCG tablet Take 1 tablet (100 mcg total) by mouth daily.  [name] lisinopril (ZESTRIL) 5 MG tablet TAKE 1 TABLET BY MOUTH EVERY DAY  . metoprolol tartrate (LOPRESSOR) 25 MG tablet Take 1 tablet (25 mg total) by mouth 2 (two) times daily.  [name] albuterol (PROVENTIL HFA;VENTOLIN HFA) 108 (90 Base) MCG/ACT inhaler Inhale 2 puffs into the lungs every 6 (six) hours as needed for wheezing or shortness of breath. (Patient not taking: Reported on 04/25/2019)  . Fluticasone-Salmeterol (ADVAIR DISKUS) 250-50 MCG/DOSE AEPB Inhale 1 puff into the lungs 2 (two) times daily. (Patient not taking: Reported on 10/07/2019)  . ipratropium-albuterol (DUONEB) 0.5-2.5 (3) MG/3ML SOLN Take 3 mLs by nebulization 2 (two) times daily for 4 days. DuoNeb nebulizer treatments twice a day for 4 days followed by as needed for severe shortness of breath (Patient not taking: Reported on 10/07/2019)   No facility-administered encounter medications on file as of 10/07/2019.     Activities of Daily Living In your present state of health, do you have any difficulty performing the following activities: 10/07/2019 01/14/2019  Hearing? Y N  Comment no hearing aids -  Vision? N N  Comment eyeglasses, goes to Dr. [name]  Difficulty concentrating or making decisions? Y N  Walking or climbing stairs? Y Y  Dressing or bathing? N Y  Doing errands, shopping? N N  Preparing Food and eating ? N -  Using the Toilet? N -  In the past six months, have you accidently leaked urine? Y -  Comment pads -  Do you have problems with loss of bowel  control? N -  Managing your Medications? N -  Managing your Finances? N -  Housekeeping or managing your Housekeeping? N -  Some recent data might be hidden    Patient Care Team: Valerie Roys, DO as PCP - General (Family Medicine) Christene Lye, MD (General Surgery)    Assessment:   This is a routine wellness examination for [place].  Exercise Activities and Dietary recommendations Current Exercise Habits: The patient does not participate in regular exercise at  present, Exercise limited by: None identified  Goals    . Quit Smoking     Smoking cessation discussed    . Quit smoking / using tobacco      Decrease the amount of cigarettes smoked per day. Currently smokes 20 per day.  Decrease to 15, then to 10, then to 5, then to 2, then to 1 per day. Then to quit.    . Quit smoking / using tobacco     Discussed smoking cessation        Fall Risk: Fall Risk  10/07/2019 06/25/2018 05/04/2017 12/09/2016 05/23/2016  Falls in the past year? 0 No No No No  Number falls in past yr: 0 - - - -  Injury with Fall? 0 - - - -    FALL RISK PREVENTION PERTAINING TO THE HOME:  Any stairs in or around the home? No  If so, are there any without handrails? No   Home free of loose throw rugs in walkways, pet beds, electrical cords, etc? Yes  Adequate lighting in your home to reduce risk of falls? Yes   ASSISTIVE DEVICES UTILIZED TO PREVENT FALLS:  Life alert? No  Use of a cane, walker or w/c? Yes  cane  Grab bars in the bathroom? Yes  Shower chair or bench in shower? No  Elevated toilet seat or a handicapped toilet? No   DME ORDERS:  DME order needed?  No   TIMED UP AND GO:  Was the test performed? Yes .  Length of time to ambulate 10 feet: 10 sec.   GAIT:  Appearance of gait: OR Gait slow and steady with the use of an assistive device.  Education: Fall risk prevention has been discussed.  Intervention(s) required? No   DME/home health order needed?  No     Depression Screen PHQ 2/9 Scores 10/07/2019 06/25/2018 05/04/2017 12/09/2016  PHQ - 2 Score 0 0 0 0     Cognitive Function MMSE - Mini Mental State Exam 06/30/2015  Orientation to time 5  Orientation to Place 5  Registration 3  Attention/ Calculation 5  Recall 3  Language- name 2 objects 2  Language- repeat 1  Language- follow 3 step command 3  Language- read & follow direction 1  Write a sentence 1  Copy design 1  Total score 30     6CIT Screen 10/07/2019 06/25/2018 05/04/2017  What Year? 0 points 0 points 0 points  What month? 0 points 0 points 0 points  What time? 0 points 0 points 0 points  Count back from 20 0 points 0 points 0 points  Months in reverse 0 points 0 points 0 points  Repeat phrase 0 points 0 points 0 points  Total Score 0 0 0    Immunization History  Administered Date(s) Administered  . Influenza Split 08/05/2013  . Influenza, High Dose Seasonal PF 08/01/2018, 08/02/2019  . Influenza-Unspecified 08/11/2015, 09/02/2016, 08/14/2017  . Pneumococcal Conjugate-13 11/28/2014  . Pneumococcal Polysaccharide-23 06/26/2012  . Tdap 06/26/2012    Qualifies for Shingles Vaccine?no, not indicated   Tdap: up to date   Flu Vaccine: up to date   Pneumococcal Vaccine: up to date   Screening Tests Health Maintenance  Topic Date Due  . TETANUS/TDAP  06/26/2022  . INFLUENZA VACCINE  Completed  . DEXA SCAN  Completed  . PNA vac Low Risk Adult  Completed    Cancer Screenings:  Colorectal Screening: no longer required  Mammogram: no longer required   Bone Density:  no longer required   Lung Cancer Screening: (Low Dose CT Chest recommended if Age 60-80 years, 30 pack-year currently smoking OR have quit w/in 15years.) does not qualify.    Additional Screening:  Hepatitis C Screening: does not qualify  Vision Screening: Recommended annual ophthalmology exams for early detection of glaucoma and other disorders of the eye. Is the patient up to date with their  annual eye exam?  Yes  Who is the provider or what is the name of the office in which the pt attends annual eye exams? [facility]   Dental Screening: Recommended annual dental exams for proper oral hygiene  Community Resource Referral:  CRR required this visit? No       Plan:  I have personally reviewed and addressed the Medicare Annual Wellness questionnaire and have noted the following in the patient's chart:  A. Medical and social history B. Use of alcohol, tobacco or illicit drugs  C. Current medications and supplements D. Functional ability and status E.  Nutritional status F.  Physical activity G. Advance directives H. List of other physicians I.  Hospitalizations, surgeries, and ER visits in previous 12 months J.  [place] such as hearing and vision if needed, cognitive and depression L. Referrals and appointments   In addition, I have reviewed and discussed with patient certain preventive protocols, quality metrics, and best practice recommendations. A written personalized care plan for preventive services as well as general preventive health recommendations were provided to patient.  Signed,    Bevelyn Ngo, LPN  [id number] Nurse Health Advisor   Nurse Notes: none

## 2019-11-27 ENCOUNTER — Other Ambulatory Visit (INDEPENDENT_AMBULATORY_CARE_PROVIDER_SITE_OTHER): Payer: Self-pay | Admitting: Vascular Surgery

## 2019-11-27 DIAGNOSIS — I6523 Occlusion and stenosis of bilateral carotid arteries: Secondary | ICD-10-CM

## 2019-11-28 ENCOUNTER — Ambulatory Visit (INDEPENDENT_AMBULATORY_CARE_PROVIDER_SITE_OTHER): Payer: Medicare HMO

## 2019-11-28 ENCOUNTER — Ambulatory Visit (INDEPENDENT_AMBULATORY_CARE_PROVIDER_SITE_OTHER): Payer: Medicare HMO | Admitting: Vascular Surgery

## 2019-11-28 ENCOUNTER — Other Ambulatory Visit: Payer: Self-pay

## 2019-11-28 ENCOUNTER — Encounter (INDEPENDENT_AMBULATORY_CARE_PROVIDER_SITE_OTHER): Payer: Self-pay | Admitting: Vascular Surgery

## 2019-11-28 VITALS — BP 177/75 | HR 52 | Resp 12 | Ht 68.0 in | Wt 117.0 lb

## 2019-11-28 DIAGNOSIS — R32 Unspecified urinary incontinence: Secondary | ICD-10-CM | POA: Diagnosis not present

## 2019-11-28 DIAGNOSIS — I6523 Occlusion and stenosis of bilateral carotid arteries: Secondary | ICD-10-CM

## 2019-11-28 DIAGNOSIS — Z008 Encounter for other general examination: Secondary | ICD-10-CM | POA: Diagnosis not present

## 2019-11-28 DIAGNOSIS — E782 Mixed hyperlipidemia: Secondary | ICD-10-CM

## 2019-11-28 DIAGNOSIS — J449 Chronic obstructive pulmonary disease, unspecified: Secondary | ICD-10-CM | POA: Diagnosis not present

## 2019-11-28 DIAGNOSIS — I739 Peripheral vascular disease, unspecified: Secondary | ICD-10-CM | POA: Diagnosis not present

## 2019-11-28 DIAGNOSIS — M810 Age-related osteoporosis without current pathological fracture: Secondary | ICD-10-CM | POA: Diagnosis not present

## 2019-11-28 DIAGNOSIS — I10 Essential (primary) hypertension: Secondary | ICD-10-CM | POA: Diagnosis not present

## 2019-11-28 DIAGNOSIS — E039 Hypothyroidism, unspecified: Secondary | ICD-10-CM | POA: Diagnosis not present

## 2019-11-28 DIAGNOSIS — R69 Illness, unspecified: Secondary | ICD-10-CM | POA: Diagnosis not present

## 2019-11-28 DIAGNOSIS — I951 Orthostatic hypotension: Secondary | ICD-10-CM | POA: Diagnosis not present

## 2019-11-28 DIAGNOSIS — I69354 Hemiplegia and hemiparesis following cerebral infarction affecting left non-dominant side: Secondary | ICD-10-CM | POA: Diagnosis not present

## 2019-11-28 DIAGNOSIS — Z823 Family history of stroke: Secondary | ICD-10-CM | POA: Diagnosis not present

## 2019-11-28 DIAGNOSIS — I2510 Atherosclerotic heart disease of native coronary artery without angina pectoris: Secondary | ICD-10-CM | POA: Diagnosis not present

## 2019-11-28 NOTE — Progress Notes (Signed)
MRN : [id number]  Cathy Solis is a 79 y.o. (03/18/1941) female who presents with chief complaint of  Chief Complaint  Patient presents with  . Follow-up    carotid F/U  .  History of Present Illness:  The patient is seen for evaluation of carotid stenosis. The carotid stenosis has been followed in the past by duplex ultrasound with Dr Jamal Collin.  The patient denies amaurosis fugax. There is no recent history of TIA symptoms or focal motor deficits. There is no prior documented CVA.  There is no history of migraine headaches. There is no history of seizures.  The patient is taking enteric-coated aspirin 81 mg daily.  The patient has a history of coronary artery disease, no recent episodes of angina or shortness of breath. The patient denies PAD or claudication symptoms. There is a history of hyperlipidemia which is being treated with a statin.   Current Meds  Medication Sig  . amLODipine (NORVASC) 10 MG tablet Take 1 tablet (10 mg total) by mouth daily.  [name] levothyroxine (SYNTHROID) 100 MCG tablet Take 1 tablet (100 mcg total) by mouth daily.  [name] lisinopril (ZESTRIL) 5 MG tablet TAKE 1 TABLET BY MOUTH EVERY DAY  . metoprolol tartrate (LOPRESSOR) 25 MG tablet Take 1 tablet (25 mg total) by mouth 2 (two) times daily.    Past Medical History:  Diagnosis Date  . Breast cancer [facility]) 2006   Left breast, s/p radiation  . 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 Hypertension   . Kidney problem    Undeveloped R kidney  . 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 Personal history of tobacco use, presenting hazards to health 03/18/2016  . Stroke [facility])    residual left sided weakness  . Toe infection    followed by Dr. Jens Som    Past Surgical History:  Procedure Laterality Date  . ABDOMINAL HYSTERECTOMY    . BLADDER REPAIR    . BREAST LUMPECTOMY Left   . BREAST SURGERY     left  . CAROTID ENDARTERECTOMY  2008   left  . COLONOSCOPY  2008  .  EYE SURGERY Right 2013   cataract  . KIDNEY SURGERY  1949    Social History Social History   Tobacco Use  . Smoking status: Current Some Day Smoker    Packs/day: 0.50    Years: 30.00    Pack years: 15.00    Types: Cigarettes    Last attempt to quit: 01/08/2019    Years since quitting: 0.8  . Smokeless tobacco: Never Used  Substance Use Topics  . Alcohol use: No  . Drug use: No    Family History Family History  Problem Relation Age of Onset  . Stroke Mother   . Heart disease Father   . Heart attack Maternal Grandfather     Allergies  Allergen Reactions  . Statins Other (See Comments)    Myalgias  . Zetia [Ezetimibe] Other (See Comments)    myalgias     REVIEW OF SYSTEMS (Negative unless checked)  Constitutional: [] Weight loss  [] Fever  [] Chills Cardiac: [] Chest pain   [] Chest pressure   [] Palpitations   [] Shortness of breath when laying flat   [] Shortness of breath with exertion. Vascular:  [] Pain in legs with walking   [] Pain in legs at rest  [] History of DVT   [] Phlebitis   [] Swelling in legs   [] Varicose veins   [] Non-healing ulcers Pulmonary:   []   Uses home oxygen   [] Productive cough   [] Hemoptysis   [] Wheeze  [] COPD   [] Asthma Neurologic:  [] Dizziness   [] Seizures   [] History of stroke   [] History of TIA  [] Aphasia   [] Vissual changes   [] Weakness or numbness in arm   [] Weakness or numbness in leg Musculoskeletal:   [] Joint swelling   [x] Joint pain   [] Low back pain Hematologic:  [] Easy bruising  [] Easy bleeding   [] Hypercoagulable state   [] Anemic Gastrointestinal:  [] Diarrhea   [] Vomiting  [] Gastroesophageal reflux/heartburn   [] Difficulty swallowing. Genitourinary:  [] Chronic kidney disease   [] Difficult urination  [] Frequent urination   [] Blood in urine Skin:  [] Rashes   [] Ulcers  Psychological:  [] History of anxiety   []  History of major depression.  Physical Examination  Vitals:   11/28/19 1604 11/28/19 1605  BP: 125/71 (!) 177/75  Pulse: (!) 52     Resp: 12   Weight: 117 lb (53.1 kg)   Height: 5\' 8"  (1.727 m)    Body mass index is 17.79 kg/m. Gen: WD/WN, NAD Head: [place]/AT, No temporalis wasting.  Ear/Nose/Throat: Hearing grossly intact, nares w/o erythema or drainage Eyes: PER, EOMI, sclera nonicteric.  Neck: Supple, no large masses.   Pulmonary:  Good air movement, no audible wheezing bilaterally, no use of accessory muscles.  Cardiac: RRR, no JVD Vascular: carotid bruit Vessel Right Left  Radial Palpable Palpable  Brachial Palpable Palpable  Carotid Palpable Palpable  Gastrointestinal: Non-distended. No guarding/no peritoneal signs.  Musculoskeletal: M/S 5/5 throughout.  No deformity or atrophy.  Neurologic: CN 2-12 intact. Symmetrical.  Speech is fluent. Motor exam as listed above. Psychiatric: Judgment intact, Mood & affect appropriate for pt's clinical situation. Dermatologic: No rashes or ulcers noted.  No changes consistent with cellulitis. Lymph : No lichenification or skin changes of chronic lymphedema.  CBC Lab Results  Component Value Date   WBC 7.4 08/20/2019   HGB 13.7 08/20/2019   HCT 40.2 08/20/2019   MCV 94 08/20/2019   PLT 176 08/20/2019    BMET    Component Value Date/Time   NA 135 08/20/2019 1136   NA 134 (L) 01/03/2014 1133   K 4.0 08/20/2019 1136   K 3.6 01/03/2014 1133   CL 96 08/20/2019 1136   CL 97 (L) 01/03/2014 1133   CO2 24 08/20/2019 1136   CO2 30 01/03/2014 1133   GLUCOSE 83 08/20/2019 1136   GLUCOSE 143 (H) 01/15/2019 0452   GLUCOSE 112 (H) 01/03/2014 1133   BUN 34 (H) 08/20/2019 1136   BUN 15 01/03/2014 1133   CREATININE 1.47 (H) 08/20/2019 1136   CREATININE 0.93 01/03/2014 1133   CALCIUM 9.3 08/20/2019 1136   CALCIUM 10.2 (H) 01/03/2014 1133   GFRNONAA 33 (L) 08/20/2019 1136   GFRNONAA >60 01/03/2014 1133   GFRAA 39 (L) 08/20/2019 1136   GFRAA >60 01/03/2014 1133   CrCl cannot be calculated (Patient's most recent lab result is older than the maximum 21 days  allowed.).  COAG No results found for: INR, PROTIME  Radiology No results found.  Assessment/Plan 1. Bilateral carotid artery stenosis Recommend:  Given the patient's asymptomatic subcritical stenosis no further invasive testing or surgery at this time.  Duplex ultrasound shows <30% RICA and widely patent LICA.  Continue antiplatelet therapy as prescribed Continue management of CAD, HTN and Hyperlipidemia Healthy heart diet,  encouraged exercise at least 4 times per week Follow up in 24 months with duplex ultrasound and physical exam  - Carotid; Future  2.  Chronic obstructive pulmonary disease, unspecified COPD type ([place]) Continue pulmonary medications and aerosols as already ordered, these medications have been reviewed and there are no changes at this time.    3. Atherosclerosis of native coronary artery of native heart without angina pectoris Continue cardiac and antihypertensive medications as already ordered and reviewed, no changes at this time.  Continue statin as ordered and reviewed, no changes at this time  Nitrates PRN for chest pain   4. Mixed hyperlipidemia Continue statin as ordered and reviewed, no changes at this time     Hortencia Pilar, MD  11/28/2019 5:07 PM

## 2019-12-11 ENCOUNTER — Other Ambulatory Visit: Payer: Self-pay

## 2019-12-13 ENCOUNTER — Ambulatory Visit: Payer: Medicare HMO | Admitting: Endocrinology

## 2019-12-28 ENCOUNTER — Encounter: Payer: Self-pay | Admitting: Family Medicine

## 2020-01-07 ENCOUNTER — Other Ambulatory Visit: Payer: Self-pay | Admitting: Family Medicine

## 2020-01-07 NOTE — Telephone Encounter (Signed)
levothyroxine (SYNTHROID) 100 MCG tablet     Patient is requesting refill.    Pharmacy:  [id number] - [place], [place] [address] Phone:  [phone]  Fax:  [phone]

## 2020-01-07 NOTE — Telephone Encounter (Signed)
Requested medication (s) are due for refill today:yes  Requested medication (s) are on the active medication list: yes  Last refill:  ?  Future visit scheduled:no  Notes to clinic:  please verify dose   Requested Prescriptions  Pending Prescriptions Disp Refills   levothyroxine (SYNTHROID) 100 MCG tablet 90 tablet 3    Sig: Take 1 tablet (100 mcg total) by mouth daily.      Endocrinology:  Hypothyroid Agents Failed - 01/07/2020  1:10 PM      Failed - TSH needs to be rechecked within 3 months after an abnormal result. Refill until TSH is due.      Passed - TSH in normal range and within 360 days    TSH  Date Value Ref Range Status  07/09/2019 3.350 0.450 - 4.500 uIU/mL Final          Passed - Valid encounter within last 12 months    Recent Outpatient Visits           4 months ago Hypothyroidism, unspecified type   [place], Megan P, DO   7 months ago Hypothyroidism, unspecified type   [facility], Megan P, DO   8 months ago Acute pain of left shoulder   [place], Megan P, DO   11 months ago COPD exacerbation [facility])   [place], [place], DO   1 year ago Benign hypertensive renal disease   [facility] [place], [place], DO

## 2020-01-09 NOTE — Telephone Encounter (Signed)
Needs follow up. Years supply sent in in August. Should not be due.

## 2020-01-10 ENCOUNTER — Encounter: Payer: Self-pay | Admitting: Family Medicine

## 2020-01-10 NOTE — Telephone Encounter (Signed)
Called pt to schedule, no answer, left vm, sending letter

## 2020-01-27 ENCOUNTER — Encounter: Payer: Self-pay | Admitting: Family Medicine

## 2020-01-27 ENCOUNTER — Ambulatory Visit (INDEPENDENT_AMBULATORY_CARE_PROVIDER_SITE_OTHER): Payer: Medicare HMO | Admitting: Family Medicine

## 2020-01-27 ENCOUNTER — Other Ambulatory Visit: Payer: Self-pay

## 2020-01-27 VITALS — BP 136/60 | HR 60 | Temp 97.5°F

## 2020-01-27 DIAGNOSIS — Z0000 Encounter for general adult medical examination without abnormal findings: Secondary | ICD-10-CM

## 2020-01-27 DIAGNOSIS — D510 Vitamin B12 deficiency anemia due to intrinsic factor deficiency: Secondary | ICD-10-CM | POA: Diagnosis not present

## 2020-01-27 DIAGNOSIS — J42 Unspecified chronic bronchitis: Secondary | ICD-10-CM | POA: Diagnosis not present

## 2020-01-27 DIAGNOSIS — I129 Hypertensive chronic kidney disease with stage 1 through stage 4 chronic kidney disease, or unspecified chronic kidney disease: Secondary | ICD-10-CM

## 2020-01-27 DIAGNOSIS — N183 Chronic kidney disease, stage 3 unspecified: Secondary | ICD-10-CM

## 2020-01-27 DIAGNOSIS — Q600 Renal agenesis, unilateral: Secondary | ICD-10-CM | POA: Diagnosis not present

## 2020-01-27 DIAGNOSIS — J441 Chronic obstructive pulmonary disease with (acute) exacerbation: Secondary | ICD-10-CM

## 2020-01-27 DIAGNOSIS — E039 Hypothyroidism, unspecified: Secondary | ICD-10-CM | POA: Diagnosis not present

## 2020-01-27 DIAGNOSIS — E782 Mixed hyperlipidemia: Secondary | ICD-10-CM

## 2020-01-27 DIAGNOSIS — IMO0002 Reserved for concepts with insufficient information to code with codable children: Secondary | ICD-10-CM

## 2020-01-27 DIAGNOSIS — R69 Illness, unspecified: Secondary | ICD-10-CM | POA: Diagnosis not present

## 2020-01-27 MED ORDER — LISINOPRIL 5 MG PO TABS: ORAL_TABLET | ORAL | 1 refills | Status: DC

## 2020-01-27 MED ORDER — AMLODIPINE BESYLATE 10 MG PO TABS: 10.0000 mg | ORAL_TABLET | Freq: Every day | ORAL | 1 refills | Status: DC

## 2020-01-27 MED ORDER — ALBUTEROL SULFATE HFA 108 (90 BASE) MCG/ACT IN AERS: 2.0000 | INHALATION_SPRAY | Freq: Four times a day (QID) | RESPIRATORY_TRACT | 6 refills | Status: DC | PRN

## 2020-01-27 MED ORDER — AZITHROMYCIN 250 MG PO TABS: ORAL_TABLET | ORAL | 0 refills | Status: DC

## 2020-01-27 MED ORDER — IPRATROPIUM-ALBUTEROL 0.5-2.5 (3) MG/3ML IN SOLN: 3.0000 mL | Freq: Two times a day (BID) | RESPIRATORY_TRACT | 1 refills | Status: DC

## 2020-01-27 MED ORDER — PREDNISONE 10 MG PO TABS: ORAL_TABLET | ORAL | 0 refills | Status: DC

## 2020-01-27 MED ORDER — FLUTICASONE-SALMETEROL 250-50 MCG/DOSE IN AEPB: 1.0000 | INHALATION_SPRAY | Freq: Two times a day (BID) | RESPIRATORY_TRACT | 1 refills | Status: DC

## 2020-01-27 MED ORDER — METOPROLOL TARTRATE 25 MG PO TABS: 25.0000 mg | ORAL_TABLET | Freq: Two times a day (BID) | ORAL | 1 refills | Status: DC

## 2020-01-27 NOTE — Assessment & Plan Note (Signed)
Statin intolerant. Rechecking labs today. Await results. Call with any concerns.

## 2020-01-27 NOTE — Progress Notes (Signed)
BP 136/60 (BP Location: Left Arm, Cuff Size: Normal)   Pulse 60   Temp (!) 97.5 F (36.4 C)    Subjective:    Patient ID: Cathy Solis, female    DOB: 1941/11/20, 79 y.o.   MRN: [id number]  HPI: Cathy Solis is a 79 y.o. female presenting on 01/27/2020 for comprehensive medical examination. Current medical complaints include:  Started with some jaw pain after getting her Tdap shot. She felt like she couldn't totally open her jaw.  HYPOTHYROIDISM Thyroid control status:stable Satisfied with current treatment? yes Medication side effects: no Medication compliance: excellent compliance Etiology of hypothyroidism:  Recent dose adjustment:no Fatigue: no Cold intolerance: yes Heat intolerance: no Weight gain: no Weight loss: no Constipation: no Diarrhea/loose stools: yes Palpitations: no Lower extremity edema: no Anxiety/depressed mood: no  HYPERTENSION / HYPERLIPIDEMIA Satisfied with current treatment? yes Duration of hypertension: chronic BP monitoring frequency: not checking BP medication side effects: no Past BP meds: lisinopril, metoprolol Duration of hyperlipidemia: chronic Cholesterol medication side effects: no Cholesterol supplements: none Past cholesterol medications: several- statin intolerant Medication compliance: excellent compliance Aspirin: no Recent stressors: no Recurrent headaches: no Visual changes: no Palpitations: no Dyspnea: no Chest pain: no Lower extremity edema: no Dizzy/lightheaded: no  Menopausal Symptoms: no  Depression Screen done today and results listed below:  Depression screen [facility] 2/9 10/07/2019 06/25/2018 05/04/2017 12/09/2016 05/23/2016  Decreased Interest 0 0 0 0 0  Down, Depressed, Hopeless 0 0 0 0 0  PHQ - 2 Score 0 0 0 0 0    Past Medical History:  Past Medical History:  Diagnosis Date  . Breast cancer [facility]) 2006   Left breast, s/p radiation  . 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 Hypertension   . Kidney problem    Undeveloped R kidney  . 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 Personal history of tobacco use, presenting hazards to health 03/18/2016  . Stroke [facility])    residual left sided weakness  . Toe infection    followed by Dr. Jens Som    Surgical History:  Past Surgical History:  Procedure Laterality Date  . ABDOMINAL HYSTERECTOMY    . BLADDER REPAIR    . BREAST LUMPECTOMY Left   . BREAST SURGERY     left  . CAROTID ENDARTERECTOMY  2008   left  . COLONOSCOPY  2008  . EYE SURGERY Right 2013   cataract  . KIDNEY SURGERY  1949    Medications:  Current Outpatient Medications on File Prior to Visit  Medication Sig  . levothyroxine (SYNTHROID) 100 MCG tablet Take 1 tablet (100 mcg total) by mouth daily.   No current facility-administered medications on file prior to visit.    Allergies:  Allergies  Allergen Reactions  . Statins Other (See Comments)    Myalgias  . Zetia [Ezetimibe] Other (See Comments)    myalgias    Social History:  Social History   Socioeconomic History  . Marital status: Widowed    Spouse name: Not on file  . Number of children: Not on file  . Years of education: Not on file  . Highest education level: High school graduate  Occupational History  . Not on file  Tobacco Use  . Smoking status: Current Some Day Smoker    Packs/day: 0.50    Years: 30.00    Pack years: 15.00    Types: Cigarettes    Last attempt to quit: 01/08/2019  Years since quitting: 1.0  . Smokeless tobacco: Never Used  Substance and Sexual Activity  . Alcohol use: No  . Drug use: No  . Sexual activity: Never  Other Topics Concern  . Not on file  Social History Narrative  . Not on file   Social Determinants of Health   Financial Resource Strain:   . Difficulty of Paying Living Expenses: Not on file  Food Insecurity:   . Worried About Charity fundraiser in the Last Year: Not on file  . Ran Out of Food in the Last Year: Not on  file  Transportation Needs:   . Lack of Transportation (Medical): Not on file  . Lack of Transportation (Non-Medical): Not on file  Physical Activity:   . Days of Exercise per Week: Not on file  . Minutes of Exercise per Session: Not on file  Stress:   . Feeling of Stress : Not on file  Social Connections:   . Frequency of Communication with Friends and Family: Not on file  . Frequency of Social Gatherings with Friends and Family: Not on file  . Attends Religious Services: Not on file  . Active Member of Clubs or Organizations: Not on file  . Attends Archivist Meetings: Not on file  . Marital Status: Not on file  Intimate Partner Violence:   . Fear of Current or Ex-Partner: Not on file  . Emotionally Abused: Not on file  . Physically Abused: Not on file  . Sexually Abused: Not on file   Social History   Tobacco Use  Smoking Status Current Some Day Smoker  . Packs/day: 0.50  . Years: 30.00  . Pack years: 15.00  . Types: Cigarettes  . Last attempt to quit: 01/08/2019  . Years since quitting: 1.0  Smokeless Tobacco Never Used   Social History   Substance and Sexual Activity  Alcohol Use No    Family History:  Family History  Problem Relation Age of Onset  . Stroke Mother   . Heart disease Father   . Heart attack Maternal Grandfather     Past medical history, surgical history, medications, allergies, family history and social history reviewed with patient today and changes made to appropriate areas of the chart.   Review of Systems  Constitutional: Negative.   HENT: Positive for congestion. Negative for ear discharge, ear pain, hearing loss, nosebleeds, sinus pain, sore throat and tinnitus.   Eyes: Negative.   Respiratory: Positive for cough and wheezing. Negative for hemoptysis, sputum production, shortness of breath and stridor.   Cardiovascular: Negative.   Gastrointestinal: Negative.   Genitourinary: Positive for frequency. Negative for dysuria,  flank pain, hematuria and urgency.  Musculoskeletal: Negative.   Skin: Negative.   Neurological: Negative.   Endo/Heme/Allergies: Negative.   Psychiatric/Behavioral: Negative.     All other ROS negative except what is listed above and in the HPI.      Objective:    BP 136/60 (BP Location: Left Arm, Cuff Size: Normal)   Pulse 60   Temp (!) 97.5 F (36.4 C)   Wt Readings from Last 3 Encounters:  11/28/19 117 lb (53.1 kg)  10/07/19 115 lb 9.6 oz (52.4 kg)  08/20/19 115 lb (52.2 kg)    Physical Exam Vitals and nursing note reviewed.  Constitutional:      General: She is not in acute distress.    Appearance: Normal appearance. She is cachectic. She is ill-appearing. She is not toxic-appearing or diaphoretic.  HENT:  Head: Normocephalic and atraumatic.     Right Ear: Tympanic membrane, ear canal and external ear normal. There is no impacted cerumen.     Left Ear: Tympanic membrane, ear canal and external ear normal. There is no impacted cerumen.     Nose: Nose normal. No congestion or rhinorrhea.     Mouth/Throat:     Mouth: Mucous membranes are moist.     Pharynx: Oropharynx is clear. No oropharyngeal exudate or posterior oropharyngeal erythema.  Eyes:     General: No scleral icterus.       Right eye: No discharge.        Left eye: No discharge.     Extraocular Movements: Extraocular movements intact.     Conjunctiva/sclera: Conjunctivae normal.     Pupils: Pupils are equal, round, and reactive to light.  Neck:     Vascular: No carotid bruit.  Cardiovascular:     Rate and Rhythm: Normal rate and regular rhythm.     Pulses: Normal pulses.     Heart sounds: No murmur. No friction rub. No gallop.   Pulmonary:     Effort: Pulmonary effort is normal. No respiratory distress.     Breath sounds: No stridor. Wheezing and rhonchi present. No rales.  Chest:     Chest wall: No tenderness.  Abdominal:     General: Abdomen is flat. Bowel sounds are normal. There is no  distension.     Palpations: Abdomen is soft. There is no mass.     Tenderness: There is no abdominal tenderness. There is no right CVA tenderness, left CVA tenderness, guarding or rebound.     Hernia: No hernia is present.  Genitourinary:    Comments: Breast and pelvic exams deferred with shared decision making Musculoskeletal:        General: No swelling, tenderness, deformity or signs of injury.     Cervical back: Normal range of motion and neck supple. No rigidity. No muscular tenderness.     Right lower leg: No edema.     Left lower leg: No edema.  Lymphadenopathy:     Cervical: No cervical adenopathy.  Skin:    General: Skin is warm and dry.     Capillary Refill: Capillary refill takes less than 2 seconds.     Coloration: Skin is not jaundiced or pale.     Findings: No bruising, erythema, lesion or rash.  Neurological:     General: No focal deficit present.     Mental Status: She is alert and oriented to person, place, and time. Mental status is at baseline.     Cranial Nerves: No cranial nerve deficit.     Sensory: No sensory deficit.     Motor: No weakness.     Coordination: Coordination normal.     Gait: Gait normal.     Deep Tendon Reflexes: Reflexes normal.  Psychiatric:        Mood and Affect: Mood normal.        Behavior: Behavior normal.        Thought Content: Thought content normal.        Judgment: Judgment normal.     Results for orders placed or performed in visit on 08/20/19  Comprehensive metabolic panel  Result Value Ref Range   Glucose 83 65 - 99 mg/dL   BUN 34 (H) 8 - 27 mg/dL   Creatinine, Ser 1.47 (H) 0.57 - 1.00 mg/dL   GFR calc non Af Amer 33 (L) >59 mL/min/1.73  GFR calc Af Amer 39 (L) >59 mL/min/1.73   BUN/Creatinine Ratio 23 12 - 28   Sodium 135 134 - 144 mmol/L   Potassium 4.0 3.5 - 5.2 mmol/L   Chloride 96 96 - 106 mmol/L   CO2 24 20 - 29 mmol/L   Calcium 9.3 8.7 - 10.3 mg/dL   Total Protein 6.4 6.0 - 8.5 g/dL   Albumin 3.8 3.7 - 4.7  g/dL   Globulin, Total 2.6 1.5 - 4.5 g/dL   Albumin/Globulin Ratio 1.5 1.2 - 2.2   Bilirubin Total <0.2 0.0 - 1.2 mg/dL   Alkaline Phosphatase 75 39 - 117 IU/L   AST 18 0 - 40 IU/L   ALT 13 0 - 32 IU/L  Lipid Panel w/o Chol/HDL Ratio  Result Value Ref Range   Cholesterol, Total 257 (H) 100 - 199 mg/dL   Triglycerides 140 0 - 149 mg/dL   HDL 76 >39 mg/dL   VLDL Cholesterol Cal 25 5 - 40 mg/dL   LDL Chol Calc (NIH) 156 (H) 0 - 99 mg/dL  CBC with Differential/Platelet  Result Value Ref Range   WBC 7.4 3.4 - 10.8 x10E3/uL   RBC 4.28 3.77 - 5.28 x10E6/uL   Hemoglobin 13.7 11.1 - 15.9 g/dL   Hematocrit 40.2 34.0 - 46.6 %   MCV 94 79 - 97 fL   MCH 32.0 26.6 - 33.0 pg   MCHC 34.1 31.5 - 35.7 g/dL   RDW 12.7 11.7 - 15.4 %   Platelets 176 150 - 450 x10E3/uL   Neutrophils 58 Not Estab. %   Lymphs 29 Not Estab. %   Monocytes 9 Not Estab. %   Eos 2 Not Estab. %   Basos 1 Not Estab. %   Neutrophils Absolute 4.4 1.4 - 7.0 x10E3/uL   Lymphocytes Absolute 2.1 0.7 - 3.1 x10E3/uL   Monocytes Absolute 0.7 0.1 - 0.9 x10E3/uL   EOS (ABSOLUTE) 0.1 0.0 - 0.4 x10E3/uL   Basophils Absolute 0.1 0.0 - 0.2 x10E3/uL   Immature Granulocytes 1 Not Estab. %   Immature Grans (Abs) 0.0 0.0 - 0.1 x10E3/uL      Assessment & Plan:   Problem List Items Addressed This Visit      Respiratory   COPD (chronic obstructive pulmonary disease) ([place])    Will treat with prednisone and azithromycin. Restart her inhalers. Call with any concerns.       Relevant Medications   Fluticasone-Salmeterol (ADVAIR DISKUS) 250-50 MCG/DOSE AEPB   ipratropium-albuterol (DUONEB) 0.5-2.5 (3) MG/3ML SOLN   predniSONE (DELTASONE) 10 MG tablet   azithromycin (ZITHROMAX) 250 MG tablet   albuterol (VENTOLIN HFA) 108 (90 Base) MCG/ACT inhaler     Endocrine   Hypothyroidism    Feeling well. Checking labs today. Await results. Treat as needed. Continue to monitor.       Relevant Medications   metoprolol tartrate (LOPRESSOR) 25  MG tablet   Other Relevant Orders   CBC with Differential/Platelet   Comprehensive metabolic panel   TSH     Genitourinary   Benign hypertensive renal disease    Under good control on current regimen. Continue current regimen. Continue to monitor. Call with any concerns. Refills given. Labs drawn today.       Relevant Orders   CBC with Differential/Platelet   Comprehensive metabolic panel   UA/M w/rflx Culture, Routine   Microalbumin, Urine Waived   Chronic kidney disease (CKD), stage III (moderate)    Checking labs today. Await results. Call with any concerns.  Relevant Orders   CBC with Differential/Platelet   Comprehensive metabolic panel     Other   Hyperlipidemia    Statin intolerant. Rechecking labs today. Await results. Call with any concerns.       Relevant Medications   amLODipine (NORVASC) 10 MG tablet   lisinopril (ZESTRIL) 5 MG tablet   metoprolol tartrate (LOPRESSOR) 25 MG tablet   Other Relevant Orders   CBC with Differential/Platelet   Comprehensive metabolic panel   Lipid Panel w/o Chol/HDL Ratio   Pernicious anemia    Rechecking labs today. Await results. Call with any concerns.       Relevant Orders   CBC with Differential/Platelet   Comprehensive metabolic panel   [id number]   Solitary kidney    Checking labs today. Await results. Call with any concerns.       Relevant Orders   CBC with Differential/Platelet   Comprehensive metabolic panel    Other Visit Diagnoses    Routine general medical examination at a health care facility    -  Primary   Vaccines up to date. Screening labs checked today. Continue diet and exercise. Call with any concerns. Continue to monitor.        Follow up plan: Return in about 2 weeks (around 02/10/2020) for lung recheck.   LABORATORY TESTING:  - Pap smear: not applicable  IMMUNIZATIONS:   - Tdap: Tetanus vaccination status reviewed: last tetanus booster within 10 years. - Influenza: Up to date -  Pneumovax: Up to date - Prevnar: Up to date  PATIENT COUNSELING:   Advised to take 1 mg of folate supplement per day if capable of pregnancy.   Sexuality: Discussed sexually transmitted diseases, partner selection, use of condoms, avoidance of unintended pregnancy  and contraceptive alternatives.   Advised to avoid cigarette smoking.  I discussed with the patient that most people either abstain from alcohol or drink within safe limits (<=14/week and <=4 drinks/occasion for males, <=7/weeks and <= 3 drinks/occasion for females) and that the risk for alcohol disorders and other health effects rises proportionally with the number of drinks per week and how often a drinker exceeds daily limits.  Discussed cessation/primary prevention of drug use and availability of treatment for abuse.   Diet: Encouraged to adjust caloric intake to maintain  or achieve ideal body weight, to reduce intake of dietary saturated fat and total fat, to limit sodium intake by avoiding high sodium foods and not adding table salt, and to maintain adequate dietary potassium and calcium preferably from fresh fruits, vegetables, and low-fat dairy products.    stressed the importance of regular exercise  Injury prevention: Discussed safety belts, safety helmets, smoke detector, smoking near bedding or upholstery.   Dental health: Discussed importance of regular tooth brushing, flossing, and dental visits.    NEXT PREVENTATIVE PHYSICAL DUE IN 1 YEAR. Return in about 2 weeks (around 02/10/2020) for lung recheck.

## 2020-01-27 NOTE — Assessment & Plan Note (Signed)
Checking labs today. Await results. Call with any concerns.  

## 2020-01-27 NOTE — Assessment & Plan Note (Signed)
Feeling well. Checking labs today. Await results. Treat as needed. Continue to monitor.

## 2020-01-27 NOTE — Assessment & Plan Note (Signed)
Under good control on current regimen. Continue current regimen. Continue to monitor. Call with any concerns. Refills given. Labs drawn today.   

## 2020-01-27 NOTE — Assessment & Plan Note (Signed)
Will treat with prednisone and azithromycin. Restart her inhalers. Call with any concerns.

## 2020-01-27 NOTE — Assessment & Plan Note (Signed)
Rechecking labs today. Await results. Call with any concerns.  

## 2020-01-28 LAB — COMPREHENSIVE METABOLIC PANEL
ALT: 9 IU/L (ref 0–32)
AST: 16 IU/L (ref 0–40)
Albumin/Globulin Ratio: 1.1 — ABNORMAL LOW (ref 1.2–2.2)
Albumin: 3.5 g/dL — ABNORMAL LOW (ref 3.6–4.6)
Alkaline Phosphatase: 94 IU/L (ref 39–117)
BUN/Creatinine Ratio: 17 (ref 12–28)
BUN: 23 mg/dL (ref 8–27)
Bilirubin Total: <0.2 mg/dL (ref 0.0–1.2)
CO2: 23 mmol/L (ref 20–29)
Calcium: 9.5 mg/dL (ref 8.7–10.3)
Chloride: 95 mmol/L — ABNORMAL LOW (ref 96–106)
Creatinine, Ser: 1.34 mg/dL — ABNORMAL HIGH (ref 0.57–1.00)
GFR calc Af Amer: 43 mL/min/{1.73_m2} — ABNORMAL LOW (ref >59)
GFR calc non Af Amer: 37 mL/min/{1.73_m2} — ABNORMAL LOW (ref >59)
Globulin, Total: 3.3 g/dL (ref 1.5–4.5)
Glucose: 95 mg/dL (ref 65–99)
Potassium: 4.1 mmol/L (ref 3.5–5.2)
Sodium: 132 mmol/L — ABNORMAL LOW (ref 134–144)
Total Protein: 6.8 g/dL (ref 6.0–8.5)

## 2020-01-28 LAB — CBC WITH DIFFERENTIAL/PLATELET
Basophils Absolute: 0 10*3/uL (ref 0.0–0.2)
Basos: 0 %
EOS (ABSOLUTE): 0.1 10*3/uL (ref 0.0–0.4)
Eos: 1 %
Hematocrit: 40.2 % (ref 34.0–46.6)
Hemoglobin: 13.5 g/dL (ref 11.1–15.9)
Immature Grans (Abs): 0.1 10*3/uL (ref 0.0–0.1)
Immature Granulocytes: 1 %
Lymphocytes Absolute: 1.5 10*3/uL (ref 0.7–3.1)
Lymphs: 14 %
MCH: 31.8 pg (ref 26.6–33.0)
MCHC: 33.6 g/dL (ref 31.5–35.7)
MCV: 95 fL (ref 79–97)
Monocytes Absolute: 1 10*3/uL — ABNORMAL HIGH (ref 0.1–0.9)
Monocytes: 9 %
Neutrophils Absolute: 8.2 10*3/uL — ABNORMAL HIGH (ref 1.4–7.0)
Neutrophils: 75 %
Platelets: 368 10*3/uL (ref 150–450)
RBC: 4.25 x10E6/uL (ref 3.77–5.28)
RDW: 12.1 % (ref 11.7–15.4)
WBC: 10.9 10*3/uL — ABNORMAL HIGH (ref 3.4–10.8)

## 2020-01-28 LAB — LIPID PANEL W/O CHOL/HDL RATIO
Cholesterol, Total: 231 mg/dL — ABNORMAL HIGH (ref 100–199)
HDL: 79 mg/dL (ref >39)
LDL Chol Calc (NIH): 136 mg/dL — ABNORMAL HIGH (ref 0–99)
Triglycerides: 95 mg/dL (ref 0–149)
VLDL Cholesterol Cal: 16 mg/dL (ref 5–40)

## 2020-01-28 LAB — TSH: TSH: 25.4 u[IU]/mL — ABNORMAL HIGH (ref 0.450–4.500)

## 2020-01-28 LAB — VITAMIN B12: Vitamin B-12: 428 pg/mL (ref 232–1245)

## 2020-01-29 ENCOUNTER — Other Ambulatory Visit: Payer: Self-pay | Admitting: Family Medicine

## 2020-01-29 DIAGNOSIS — E039 Hypothyroidism, unspecified: Secondary | ICD-10-CM

## 2020-01-29 MED ORDER — LEVOTHYROXINE SODIUM 125 MCG PO TABS: 125.0000 ug | ORAL_TABLET | Freq: Every day | ORAL | 2 refills | Status: DC

## 2020-02-13 ENCOUNTER — Ambulatory Visit (INDEPENDENT_AMBULATORY_CARE_PROVIDER_SITE_OTHER): Payer: Medicare HMO | Admitting: Family Medicine

## 2020-02-13 ENCOUNTER — Encounter: Payer: Self-pay | Admitting: Family Medicine

## 2020-02-13 ENCOUNTER — Other Ambulatory Visit: Payer: Self-pay

## 2020-02-13 DIAGNOSIS — J410 Simple chronic bronchitis: Secondary | ICD-10-CM | POA: Diagnosis not present

## 2020-02-13 NOTE — Assessment & Plan Note (Signed)
Lungs clear. Excerbation resolved. Continue to monitor. Call with any concerns.

## 2020-02-13 NOTE — Progress Notes (Signed)
BP 121/62 (BP Location: Left Arm, Patient Position: Sitting, Cuff Size: Normal)   Pulse (!) 54   SpO2 97%    Subjective:    Patient ID: Cathy Solis, female    DOB: 1941/11/20, 79 y.o.   MRN: [id number]  HPI: Cathy Solis is a 79 y.o. female  Chief Complaint  Patient presents with  . COPD   Has been feeling better. No coughing. Has been feeling a little short of breath. Does not feel like her inhalers have been helping. Feels like the nebulizer makes her jittery. She is otherwise feeling much better. No other concerns or complaints at this time.   Relevant past medical, surgical, family and social history reviewed and updated as indicated. Interim medical history since our last visit reviewed. Allergies and medications reviewed and updated.  Review of Systems  Constitutional: Negative.   HENT: Negative.   Respiratory: Positive for shortness of breath. Negative for apnea, cough, choking, chest tightness, wheezing and stridor.   Cardiovascular: Negative.   Gastrointestinal: Negative.   Psychiatric/Behavioral: Negative.     Per HPI unless specifically indicated above     Objective:    BP 121/62 (BP Location: Left Arm, Patient Position: Sitting, Cuff Size: Normal)   Pulse (!) 54   SpO2 97%   Wt Readings from Last 3 Encounters:  11/28/19 117 lb (53.1 kg)  10/07/19 115 lb 9.6 oz (52.4 kg)  08/20/19 115 lb (52.2 kg)    Physical Exam Vitals and nursing note reviewed.  Constitutional:      General: She is not in acute distress.    Appearance: Normal appearance. She is not ill-appearing, toxic-appearing or diaphoretic.  HENT:     Head: Normocephalic and atraumatic.     Right Ear: External ear normal.     Left Ear: External ear normal.     Nose: Nose normal.     Mouth/Throat:     Mouth: Mucous membranes are moist.     Pharynx: Oropharynx is clear.  Eyes:     General: No scleral icterus.       Right eye: No discharge.        Left eye: No discharge.     Extraocular  Movements: Extraocular movements intact.     Conjunctiva/sclera: Conjunctivae normal.     Pupils: Pupils are equal, round, and reactive to light.  Cardiovascular:     Rate and Rhythm: Normal rate and regular rhythm.     Pulses: Normal pulses.     Heart sounds: Normal heart sounds. No murmur. No friction rub. No gallop.   Pulmonary:     Effort: Pulmonary effort is normal. No respiratory distress.     Breath sounds: Normal breath sounds. No stridor. No wheezing, rhonchi or rales.  Chest:     Chest wall: No tenderness.  Musculoskeletal:        General: Normal range of motion.     Cervical back: Normal range of motion and neck supple.  Skin:    General: Skin is warm and dry.     Capillary Refill: Capillary refill takes less than 2 seconds.     Coloration: Skin is not jaundiced or pale.     Findings: No bruising, erythema, lesion or rash.  Neurological:     General: No focal deficit present.     Mental Status: She is alert and oriented to person, place, and time. Mental status is at baseline.  Psychiatric:        Mood and Affect: Mood  normal.        Behavior: Behavior normal.        Thought Content: Thought content normal.        Judgment: Judgment normal.     Results for orders placed or performed in visit on 01/27/20  CBC with Differential/Platelet  Result Value Ref Range   WBC 10.9 (H) 3.4 - 10.8 x10E3/uL   RBC 4.25 3.77 - 5.28 x10E6/uL   Hemoglobin 13.5 11.1 - 15.9 g/dL   Hematocrit 40.2 34.0 - 46.6 %   MCV 95 79 - 97 fL   MCH 31.8 26.6 - 33.0 pg   MCHC 33.6 31.5 - 35.7 g/dL   RDW 12.1 11.7 - 15.4 %   Platelets 368 150 - 450 x10E3/uL   Neutrophils 75 Not Estab. %   Lymphs 14 Not Estab. %   Monocytes 9 Not Estab. %   Eos 1 Not Estab. %   Basos 0 Not Estab. %   Neutrophils Absolute 8.2 (H) 1.4 - 7.0 x10E3/uL   Lymphocytes Absolute 1.5 0.7 - 3.1 x10E3/uL   Monocytes Absolute 1.0 (H) 0.1 - 0.9 x10E3/uL   EOS (ABSOLUTE) 0.1 0.0 - 0.4 x10E3/uL   Basophils Absolute 0.0 0.0  - 0.2 x10E3/uL   Immature Granulocytes 1 Not Estab. %   Immature Grans (Abs) 0.1 0.0 - 0.1 x10E3/uL  Comprehensive metabolic panel  Result Value Ref Range   Glucose 95 65 - 99 mg/dL   BUN 23 8 - 27 mg/dL   Creatinine, Ser 1.34 (H) 0.57 - 1.00 mg/dL   GFR calc non Af Amer 37 (L) >59 mL/min/1.73   GFR calc Af Amer 43 (L) >59 mL/min/1.73   BUN/Creatinine Ratio 17 12 - 28   Sodium 132 (L) 134 - 144 mmol/L   Potassium 4.1 3.5 - 5.2 mmol/L   Chloride 95 (L) 96 - 106 mmol/L   CO2 23 20 - 29 mmol/L   Calcium 9.5 8.7 - 10.3 mg/dL   Total Protein 6.8 6.0 - 8.5 g/dL   Albumin 3.5 (L) 3.6 - 4.6 g/dL   Globulin, Total 3.3 1.5 - 4.5 g/dL   Albumin/Globulin Ratio 1.1 (L) 1.2 - 2.2   Bilirubin Total <0.2 0.0 - 1.2 mg/dL   Alkaline Phosphatase 94 39 - 117 IU/L   AST 16 0 - 40 IU/L   ALT 9 0 - 32 IU/L  Lipid Panel w/o Chol/HDL Ratio  Result Value Ref Range   Cholesterol, Total 231 (H) 100 - 199 mg/dL   Triglycerides 95 0 - 149 mg/dL   HDL 79 >39 mg/dL   VLDL Cholesterol Cal 16 5 - 40 mg/dL   LDL Chol Calc (NIH) 136 (H) 0 - 99 mg/dL  TSH  Result Value Ref Range   TSH 25.400 (H) 0.450 - 4.500 uIU/mL  B12  Result Value Ref Range   Vitamin B-12 428 232 - 1,245 pg/mL      Assessment & Plan:   Problem List Items Addressed This Visit      Respiratory   COPD (chronic obstructive pulmonary disease) ([place])    Lungs clear. Excerbation resolved. Continue to monitor. Call with any concerns.           Follow up plan: Return in about 5 months (around 07/15/2020).

## 2020-03-05 ENCOUNTER — Other Ambulatory Visit: Payer: Self-pay

## 2020-03-05 ENCOUNTER — Ambulatory Visit: Payer: Medicare HMO | Admitting: Family Medicine

## 2020-04-21 ENCOUNTER — Other Ambulatory Visit: Payer: Self-pay | Admitting: Family Medicine

## 2020-07-16 ENCOUNTER — Other Ambulatory Visit: Payer: Self-pay | Admitting: Family Medicine

## 2020-07-16 ENCOUNTER — Ambulatory Visit: Payer: Medicare HMO | Admitting: Family Medicine

## 2020-07-16 NOTE — Telephone Encounter (Signed)
Has appt today 07/16/20

## 2020-07-16 NOTE — Telephone Encounter (Signed)
Requested medications are due for refill today?  Yes  Requested medications are on active medication list?  Yes  Last Refill:   04/21/2020  # 90 with no refills   Future visit scheduled? Yes - today  Notes to Clinic:  Medication failed Rx refill protocol due to no repeat TSH in 3 months after abnormal reading per protocol.

## 2020-07-21 ENCOUNTER — Encounter: Payer: Self-pay | Admitting: Family Medicine

## 2020-07-21 ENCOUNTER — Other Ambulatory Visit: Payer: Self-pay

## 2020-07-21 ENCOUNTER — Ambulatory Visit (INDEPENDENT_AMBULATORY_CARE_PROVIDER_SITE_OTHER): Payer: Medicare HMO | Admitting: Family Medicine

## 2020-07-21 VITALS — BP 176/71 | HR 61 | Temp 97.6°F | Wt 110.0 lb

## 2020-07-21 DIAGNOSIS — J410 Simple chronic bronchitis: Secondary | ICD-10-CM | POA: Diagnosis not present

## 2020-07-21 DIAGNOSIS — E039 Hypothyroidism, unspecified: Secondary | ICD-10-CM

## 2020-07-21 DIAGNOSIS — D510 Vitamin B12 deficiency anemia due to intrinsic factor deficiency: Secondary | ICD-10-CM | POA: Diagnosis not present

## 2020-07-21 DIAGNOSIS — I129 Hypertensive chronic kidney disease with stage 1 through stage 4 chronic kidney disease, or unspecified chronic kidney disease: Secondary | ICD-10-CM | POA: Diagnosis not present

## 2020-07-21 DIAGNOSIS — N183 Chronic kidney disease, stage 3 unspecified: Secondary | ICD-10-CM

## 2020-07-21 DIAGNOSIS — E782 Mixed hyperlipidemia: Secondary | ICD-10-CM

## 2020-07-21 DIAGNOSIS — D692 Other nonthrombocytopenic purpura: Secondary | ICD-10-CM | POA: Diagnosis not present

## 2020-07-21 MED ORDER — LISINOPRIL 10 MG PO TABS
10.0000 mg | ORAL_TABLET | Freq: Every day | ORAL | 1 refills | Status: DC
Start: 2020-07-21 — End: 2020-12-20

## 2020-07-21 MED ORDER — AMLODIPINE BESYLATE 10 MG PO TABS
10.0000 mg | ORAL_TABLET | Freq: Every day | ORAL | 1 refills | Status: DC
Start: 2020-07-21 — End: 2020-10-05

## 2020-07-21 MED ORDER — METOPROLOL TARTRATE 25 MG PO TABS
25.0000 mg | ORAL_TABLET | Freq: Two times a day (BID) | ORAL | 1 refills | Status: DC
Start: 2020-07-21 — End: 2020-10-05

## 2020-07-21 MED ORDER — ALBUTEROL SULFATE HFA 108 (90 BASE) MCG/ACT IN AERS: 2.0000 | INHALATION_SPRAY | Freq: Four times a day (QID) | RESPIRATORY_TRACT | 6 refills | Status: DC | PRN

## 2020-07-21 NOTE — Progress Notes (Signed)
BP (!) 176/71   Pulse 61   Temp 97.6 F (36.4 C) (Oral)   Wt 110 lb (49.9 kg)   SpO2 96%   BMI 16.73 kg/m    Subjective:    Patient ID: Cathy Solis, female    DOB: [birth year], 79 y.o.   MRN: [id number]  HPI: Cathy Solis is a 79 y.o. female  Chief Complaint  Patient presents with  . COPD  . Hypothyroidism  . Hypertension   HYPERTENSION / [place] Satisfied with current treatment? yes Duration of hypertension: chronic BP monitoring frequency: not checking BP medication side effects: no Past BP meds: lisinopril, metoprolol, amlodipine Duration of hyperlipidemia: chronic Cholesterol medication side effects: not on anything Cholesterol supplements: none Past cholesterol medications: none Medication compliance: excellent compliance Aspirin: no Recent stressors: no Recurrent headaches: no Visual changes: no Palpitations: no Dyspnea: no Chest pain: no Lower extremity edema: no Dizzy/lightheaded: no  HYPOTHYROIDISM Thyroid control status:uncontrolled Satisfied with current treatment? no Medication side effects: no Medication compliance: excellent compliance Recent dose adjustment:yes Fatigue: no Cold intolerance: no Heat intolerance: no Weight gain: no Weight loss: yes Constipation: no Diarrhea/loose stools: no Palpitations: no Lower extremity edema: no Anxiety/depressed mood: no  COPD COPD status: uncontrolled Satisfied with current treatment?: yes Oxygen use: no Dyspnea frequency: occasionally Cough frequency: often Rescue inhaler frequency: daily   Limitation of activity: yes Productive cough: yes Pneumovax: Up to Date Influenza: will get shortly  Relevant past medical, surgical, family and social history reviewed and updated as indicated. Interim medical history since our last visit reviewed. Allergies and medications reviewed and updated.  Review of Systems  Constitutional: Negative.   Respiratory: Negative.   Cardiovascular:  Negative.   Gastrointestinal: Negative.   Musculoskeletal: Negative.   Neurological: Negative.   Psychiatric/Behavioral: Negative.     Per HPI unless specifically indicated above     Objective:    BP (!) 176/71   Pulse 61   Temp 97.6 F (36.4 C) (Oral)   Wt 110 lb (49.9 kg)   SpO2 96%   BMI 16.73 kg/m   Wt Readings from Last 3 Encounters:  07/21/20 110 lb (49.9 kg)  11/28/19 117 lb (53.1 kg)  10/07/19 115 lb 9.6 oz (52.4 kg)    Physical Exam Vitals and nursing note reviewed.  Constitutional:      General: She is not in acute distress.    Appearance: Normal appearance. She is not ill-appearing, toxic-appearing or diaphoretic.  HENT:     Head: Normocephalic and atraumatic.     Right Ear: External ear normal.     Left Ear: External ear normal.     Nose: Nose normal.     Mouth/Throat:     Mouth: Mucous membranes are moist.     Pharynx: Oropharynx is clear.  Eyes:     General: No scleral icterus.       Right eye: No discharge.        Left eye: No discharge.     Extraocular Movements: Extraocular movements intact.     Conjunctiva/sclera: Conjunctivae normal.     Pupils: Pupils are equal, round, and reactive to light.  Cardiovascular:     Rate and Rhythm: Normal rate and regular rhythm.     Pulses: Normal pulses.     Heart sounds: Normal heart sounds. No murmur heard.  No friction rub. No gallop.   Pulmonary:     Effort: Pulmonary effort is normal. No respiratory distress.     Breath sounds: Normal breath  sounds. No stridor. No wheezing, rhonchi or rales.  Chest:     Chest wall: No tenderness.  Musculoskeletal:        General: Normal range of motion.     Cervical back: Normal range of motion and neck supple.  Skin:    General: Skin is warm and dry.     Capillary Refill: Capillary refill takes less than 2 seconds.     Coloration: Skin is not jaundiced or pale.     Findings: No bruising, erythema, lesion or rash.  Neurological:     General: No focal deficit  present.     Mental Status: She is alert and oriented to person, place, and time. Mental status is at baseline.  Psychiatric:        Mood and Affect: Mood normal.        Behavior: Behavior normal.        Thought Content: Thought content normal.        Judgment: Judgment normal.     Results for orders placed or performed in visit on 07/21/20  TSH  Result Value Ref Range   TSH 10.200 (H) 0.450 - 4.500 uIU/mL  CBC with Differential/Platelet  Result Value Ref Range   WBC 8.1 3.4 - 10.8 x10E3/uL   RBC 4.41 3.77 - 5.28 x10E6/uL   Hemoglobin 13.6 11.1 - 15.9 g/dL   Hematocrit 41.0 34.0 - 46.6 %   MCV 93 79 - 97 fL   MCH 30.8 26.6 - 33.0 pg   MCHC 33.2 31 - 35 g/dL   RDW 13.4 11.7 - 15.4 %   Platelets 173 150 - 450 x10E3/uL   Neutrophils 68 Not Estab. %   Lymphs 18 Not Estab. %   Monocytes 12 Not Estab. %   Eos 1 Not Estab. %   Basos 1 Not Estab. %   Neutrophils Absolute 5.6 1 - 7 x10E3/uL   Lymphocytes Absolute 1.4 0 - 3 x10E3/uL   Monocytes Absolute 0.9 0 - 0 x10E3/uL   EOS (ABSOLUTE) 0.1 0.0 - 0.4 x10E3/uL   Basophils Absolute 0.0 0 - 0 x10E3/uL   Immature Granulocytes 0 Not Estab. %   Immature Grans (Abs) 0.0 0.0 - 0.1 x10E3/uL  Comprehensive metabolic panel  Result Value Ref Range   Glucose 90 65 - 99 mg/dL   BUN 36 (H) 8 - 27 mg/dL   Creatinine, Ser 1.33 (H) 0.57 - 1.00 mg/dL   GFR calc non Af Amer 38 (L) >59 mL/min/1.73   GFR calc Af Amer 43 (L) >59 mL/min/1.73   BUN/Creatinine Ratio 27 12 - 28   Sodium 136 134 - 144 mmol/L   Potassium 4.1 3.5 - 5.2 mmol/L   Chloride 99 96 - 106 mmol/L   CO2 22 20 - 29 mmol/L   Calcium 9.7 8.7 - 10.3 mg/dL   Total Protein 7.2 6.0 - 8.5 g/dL   Albumin 3.7 3.6 - 4.6 g/dL   Globulin, Total 3.5 1.5 - 4.5 g/dL   Albumin/Globulin Ratio 1.1 (L) 1.2 - 2.2   Bilirubin Total <0.2 0.0 - 1.2 mg/dL   Alkaline Phosphatase 90 48 - 121 IU/L   AST 13 0 - 40 IU/L   ALT 6 0 - 32 IU/L  Lipid Panel w/o Chol/HDL Ratio  Result Value Ref Range    Cholesterol, Total 225 (H) 100 - 199 mg/dL   Triglycerides 138 0 - 149 mg/dL   HDL 69 >39 mg/dL   VLDL Cholesterol Cal 24 5 - 40 mg/dL  LDL Chol Calc (NIH) 132 (H) 0 - 99 mg/dL  B12  Result Value Ref Range   Vitamin B-12 319 232 - 1,245 pg/mL      Assessment & Plan:   Problem List Items Addressed This Visit      Respiratory   COPD (chronic obstructive pulmonary disease) ([place]) - Primary    Under good control on current regimen. Continue current regimen. Continue to monitor. Call with any concerns. Refills given.       Relevant Medications   albuterol (VENTOLIN HFA) 108 (90 Base) MCG/ACT inhaler   Other Relevant Orders   Comprehensive metabolic panel (Completed)   Referral to Chronic Care Management Services     Endocrine   Hypothyroidism    Rechecking labs today. Await results. Treat as needed.       Relevant Medications   metoprolol tartrate (LOPRESSOR) 25 MG tablet   Other Relevant Orders   TSH (Completed)   Comprehensive metabolic panel (Completed)     Genitourinary   Benign hypertensive renal disease    Not under good control. Will get thyroid under control and recheck 1 month. Continue current regimen. Call with any concerns.       Relevant Orders   Comprehensive metabolic panel (Completed)   Chronic kidney disease (CKD), stage III (moderate)    Rechecking level today. Await results.       Relevant Orders   Comprehensive metabolic panel (Completed)     Other   Hyperlipidemia    Under good control on current regimen. Continue current regimen. Continue to monitor. Call with any concerns. Labs drawn today.        Relevant Medications   amLODipine (NORVASC) 10 MG tablet   lisinopril (ZESTRIL) 10 MG tablet   metoprolol tartrate (LOPRESSOR) 25 MG tablet   Other Relevant Orders   Comprehensive metabolic panel (Completed)   Lipid Panel w/o Chol/HDL Ratio (Completed)   Pernicious anemia    Rechecking labs today. Await results. Treat as needed.        Relevant Orders   CBC with Differential/Platelet (Completed)   Comprehensive metabolic panel (Completed)   B12 (Completed)    Other Visit Diagnoses    Senile purpura ([place])       Stable. Continue to monitor. Reassured patient.    Relevant Medications   amLODipine (NORVASC) 10 MG tablet   lisinopril (ZESTRIL) 10 MG tablet   metoprolol tartrate (LOPRESSOR) 25 MG tablet       Follow up plan: Return in about 6 weeks (around 09/01/2020).

## 2020-07-22 ENCOUNTER — Telehealth: Payer: Self-pay

## 2020-07-22 LAB — CBC WITH DIFFERENTIAL/PLATELET
Basophils Absolute: 0 10*3/uL (ref 0.0–0.2)
Basos: 1 %
EOS (ABSOLUTE): 0.1 10*3/uL (ref 0.0–0.4)
Eos: 1 %
Hematocrit: 41 % (ref 34.0–46.6)
Hemoglobin: 13.6 g/dL (ref 11.1–15.9)
Immature Grans (Abs): 0 10*3/uL (ref 0.0–0.1)
Immature Granulocytes: 0 %
Lymphocytes Absolute: 1.4 10*3/uL (ref 0.7–3.1)
Lymphs: 18 %
MCH: 30.8 pg (ref 26.6–33.0)
MCHC: 33.2 g/dL (ref 31.5–35.7)
MCV: 93 fL (ref 79–97)
Monocytes Absolute: 0.9 10*3/uL (ref 0.1–0.9)
Monocytes: 12 %
Neutrophils Absolute: 5.6 10*3/uL (ref 1.4–7.0)
Neutrophils: 68 %
Platelets: 173 10*3/uL (ref 150–450)
RBC: 4.41 x10E6/uL (ref 3.77–5.28)
RDW: 13.4 % (ref 11.7–15.4)
WBC: 8.1 10*3/uL (ref 3.4–10.8)

## 2020-07-22 LAB — COMPREHENSIVE METABOLIC PANEL
ALT: 6 IU/L (ref 0–32)
AST: 13 IU/L (ref 0–40)
Albumin/Globulin Ratio: 1.1 — ABNORMAL LOW (ref 1.2–2.2)
Albumin: 3.7 g/dL (ref 3.6–4.6)
Alkaline Phosphatase: 90 IU/L (ref 48–121)
BUN/Creatinine Ratio: 27 (ref 12–28)
BUN: 36 mg/dL — ABNORMAL HIGH (ref 8–27)
Bilirubin Total: <0.2 mg/dL (ref 0.0–1.2)
CO2: 22 mmol/L (ref 20–29)
Calcium: 9.7 mg/dL (ref 8.7–10.3)
Chloride: 99 mmol/L (ref 96–106)
Creatinine, Ser: 1.33 mg/dL — ABNORMAL HIGH (ref 0.57–1.00)
GFR calc Af Amer: 43 mL/min/{1.73_m2} — ABNORMAL LOW (ref >59)
GFR calc non Af Amer: 38 mL/min/{1.73_m2} — ABNORMAL LOW (ref >59)
Globulin, Total: 3.5 g/dL (ref 1.5–4.5)
Glucose: 90 mg/dL (ref 65–99)
Potassium: 4.1 mmol/L (ref 3.5–5.2)
Sodium: 136 mmol/L (ref 134–144)
Total Protein: 7.2 g/dL (ref 6.0–8.5)

## 2020-07-22 LAB — LIPID PANEL W/O CHOL/HDL RATIO
Cholesterol, Total: 225 mg/dL — ABNORMAL HIGH (ref 100–199)
HDL: 69 mg/dL (ref >39)
LDL Chol Calc (NIH): 132 mg/dL — ABNORMAL HIGH (ref 0–99)
Triglycerides: 138 mg/dL (ref 0–149)
VLDL Cholesterol Cal: 24 mg/dL (ref 5–40)

## 2020-07-22 LAB — VITAMIN B12: Vitamin B-12: 319 pg/mL (ref 232–1245)

## 2020-07-22 LAB — TSH: TSH: 10.2 u[IU]/mL — ABNORMAL HIGH (ref 0.450–4.500)

## 2020-07-22 NOTE — Chronic Care Management (AMB) (Signed)
  Chronic Care Management   Note  07/22/2020 Name: Cathy Solis MRN: [id number] DOB: June 10, 1941  Cathy Solis is a 79 y.o. year old female who is a primary care patient of Valerie Roys, DO. I reached out to [name] by phone today in response to a referral sent by Cathy Solis's PCP, Park Liter, DO      Cathy Solis was given information about Chronic Care Management services today including:  1. CCM service includes personalized support from designated clinical staff supervised by her physician, including individualized plan of care and coordination with other care providers 2. 24/7 contact phone numbers for assistance for urgent and routine care needs. 3. Service will only be billed when office clinical staff spend 20 minutes or more in a month to coordinate care. 4. Only one practitioner may furnish and bill the service in a calendar month. 5. The patient may stop CCM services at any time (effective at the end of the month) by phone call to the office staff. 6. The patient will be responsible for cost sharing (co-pay) of up to 20% of the service fee (after annual deductible is met).  Patient agreed to services and verbal consent obtained.   Follow up plan: Telephone appointment with care management team member scheduled for:07/24/2020  Noreene Larsson, [place], [place], [place] [address] Direct Dial: [phone] [email or web address]: [email or web address]

## 2020-07-23 ENCOUNTER — Other Ambulatory Visit: Payer: Self-pay | Admitting: Family Medicine

## 2020-07-23 NOTE — Telephone Encounter (Signed)
Requested medication (s) are due for refill today: yes  Requested medication (s) are on the active medication list:yes  Last refill:  04/21/20  #90  0 refills  Future visit scheduled: yes  Notes to clinic:  TSH on 07/21/20 10.200    Requested Prescriptions  Pending Prescriptions Disp Refills   levothyroxine (SYNTHROID) 125 MCG tablet 90 tablet 0    Sig: Take 1 tablet (125 mcg total) by mouth daily.      Endocrinology:  Hypothyroid Agents Failed - 07/23/2020  2:47 PM      Failed - TSH needs to be rechecked within 3 months after an abnormal result. Refill until TSH is due.      Failed - TSH in normal range and within 360 days    TSH  Date Value Ref Range Status  07/21/2020 10.200 (H) 0.450 - 4.500 uIU/mL Final          Passed - Valid encounter within last 12 months    Recent Outpatient Visits           2 days ago Simple chronic bronchitis ([place])   [place], Megan P, DO   5 months ago Simple chronic bronchitis ([place])   [place], Megan P, DO   5 months ago Routine general medical examination at a health care facility   [facility], [place], DO   11 months ago Hypothyroidism, unspecified type   [place], [place], DO   1 year ago Hypothyroidism, unspecified type   [place], Barb Merino, DO       Future Appointments             In 1 month [place], Barb Merino, DO [employer], [facility]

## 2020-07-24 ENCOUNTER — Other Ambulatory Visit: Payer: Self-pay | Admitting: Family Medicine

## 2020-07-24 ENCOUNTER — Telehealth: Payer: Medicare HMO

## 2020-07-24 DIAGNOSIS — E039 Hypothyroidism, unspecified: Secondary | ICD-10-CM

## 2020-07-24 MED ORDER — LEVOTHYROXINE SODIUM 150 MCG PO TABS
150.0000 ug | ORAL_TABLET | Freq: Every day | ORAL | 1 refills | Status: DC
Start: 2020-07-24 — End: 2020-09-06

## 2020-07-27 NOTE — Assessment & Plan Note (Signed)
Under good control on current regimen. Continue current regimen. Continue to monitor. Call with any concerns. Labs drawn today.  

## 2020-07-27 NOTE — Assessment & Plan Note (Signed)
Rechecking level today. Await results.

## 2020-07-27 NOTE — Assessment & Plan Note (Signed)
Rechecking labs today. Await results. Treat as needed.  °

## 2020-07-27 NOTE — Assessment & Plan Note (Signed)
Not under good control. Will get thyroid under control and recheck 1 month. Continue current regimen. Call with any concerns.

## 2020-07-27 NOTE — Assessment & Plan Note (Signed)
Under good control on current regimen. Continue current regimen. Continue to monitor. Call with any concerns. Refills given.   

## 2020-08-10 DIAGNOSIS — R69 Illness, unspecified: Secondary | ICD-10-CM | POA: Diagnosis not present

## 2020-08-16 ENCOUNTER — Other Ambulatory Visit: Payer: Self-pay | Admitting: Family Medicine

## 2020-08-24 ENCOUNTER — Other Ambulatory Visit: Payer: Self-pay | Admitting: Family Medicine

## 2020-08-24 NOTE — Telephone Encounter (Signed)
Requested medication (s) are due for refill today:  No  Requested medication (s) are on the active medication list:  Yes  Future visit scheduled:  Yes  Last Refill: Lisinopril 10 mg ; #90; RF x 1  Notes to clinic:  last seen 07/21/20; no notes to reflect that Dr. Wynetta Emery was increasing dose to 10 mg, but new Rx had been filled with 10 mg. Strength.  Please review/ advise.   Requested Prescriptions  Pending Prescriptions Disp Refills   lisinopril (ZESTRIL) 5 MG tablet [Pharmacy Med Name: LISINOPRIL 5 MG TABLET] 90 tablet 1    Sig: TAKE 1 TABLET BY MOUTH EVERY DAY      Cardiovascular:  ACE Inhibitors Failed - 08/24/2020  1:34 AM      Failed - Cr in normal range and within 180 days    Creatinine  Date Value Ref Range Status  01/03/2014 0.93 0.60 - 1.30 mg/dL Final   Creatinine, Ser  Date Value Ref Range Status  07/21/2020 1.33 (H) 0.57 - 1.00 mg/dL Final   Creatinine,U  Date Value Ref Range Status  11/28/2014 48.9 mg/dL Final          Failed - Last BP in normal range    BP Readings from Last 1 Encounters:  07/21/20 (!) 176/71          Passed - K in normal range and within 180 days    Potassium  Date Value Ref Range Status  07/21/2020 4.1 3.5 - 5.2 mmol/L Final  01/03/2014 3.6 3.5 - 5.1 mmol/L Final          Passed - Patient is not pregnant      Passed - Valid encounter within last 6 months    Recent Outpatient Visits           1 month ago Simple chronic bronchitis ([place])   [place], Megan P, DO   6 months ago Simple chronic bronchitis ([place])   [place], Megan P, DO   7 months ago Routine general medical examination at a health care facility   [place], [place], DO   1 year ago Hypothyroidism, unspecified type   [place], Megan P, DO   1 year ago Hypothyroidism, unspecified type   [facility] [place], Barb Merino, DO       Future Appointments              In 1 week Wynetta Emery, Barb Merino, DO [place], [facility]

## 2020-09-03 ENCOUNTER — Ambulatory Visit (INDEPENDENT_AMBULATORY_CARE_PROVIDER_SITE_OTHER): Payer: Medicare HMO | Admitting: Family Medicine

## 2020-09-03 ENCOUNTER — Telehealth: Payer: Self-pay | Admitting: Family Medicine

## 2020-09-03 ENCOUNTER — Encounter: Payer: Self-pay | Admitting: Family Medicine

## 2020-09-03 ENCOUNTER — Other Ambulatory Visit: Payer: Self-pay

## 2020-09-03 VITALS — BP 136/72 | HR 67 | Temp 98.6°F | Ht 65.0 in | Wt 109.0 lb

## 2020-09-03 DIAGNOSIS — R413 Other amnesia: Secondary | ICD-10-CM

## 2020-09-03 DIAGNOSIS — E440 Moderate protein-calorie malnutrition: Secondary | ICD-10-CM

## 2020-09-03 DIAGNOSIS — L659 Nonscarring hair loss, unspecified: Secondary | ICD-10-CM | POA: Diagnosis not present

## 2020-09-03 DIAGNOSIS — E039 Hypothyroidism, unspecified: Secondary | ICD-10-CM

## 2020-09-03 DIAGNOSIS — I129 Hypertensive chronic kidney disease with stage 1 through stage 4 chronic kidney disease, or unspecified chronic kidney disease: Secondary | ICD-10-CM

## 2020-09-03 DIAGNOSIS — N183 Chronic kidney disease, stage 3 unspecified: Secondary | ICD-10-CM | POA: Diagnosis not present

## 2020-09-03 DIAGNOSIS — E43 Unspecified severe protein-calorie malnutrition: Secondary | ICD-10-CM | POA: Insufficient documentation

## 2020-09-03 NOTE — Assessment & Plan Note (Signed)
Likely off due to her not having her medicine for 1 week and taking a different dose. Will check labs and adjust dose as needed. Call with any concerns.

## 2020-09-03 NOTE — Assessment & Plan Note (Signed)
Continue to monitor. Encouraged diet and exercise. Call with any concerns.

## 2020-09-03 NOTE — Progress Notes (Signed)
BP 136/72 (BP Location: Left Arm, Cuff Size: Normal)   Pulse 67   Temp 98.6 F (37 C) (Oral)   Ht 5\' 5"  (1.651 m)   Wt 109 lb (49.4 kg)   SpO2 96%   BMI 18.14 kg/m    Subjective:    Patient ID: Cathy Solis, female    DOB: 11/26/1940, 79 y.o.   MRN: [id number]  HPI: Cathy Solis is a 79 y.o. female  Chief Complaint  Patient presents with  . Chronic Kidney Disease    follow up   . COPD  . Hypothyroidism   HYPOTHYROIDISM- has not been taking her medicine for about a week, had been taking 146mcg every other day for about 3 days Thyroid control status:uncontrolled Satisfied with current treatment? no Medication side effects: no Medication compliance: fair compliance Etiology of hypothyroidism:  Recent dose adjustment:yes Fatigue: yes Cold intolerance: no Heat intolerance: no Weight gain: no Weight loss: yes Constipation: no Diarrhea/loose stools: no Palpitations: yes Lower extremity edema: no Anxiety/depressed mood: no  HYPERTENSION Hypertension status: controlled  Satisfied with current treatment? yes Duration of hypertension: chronic BP monitoring frequency:  not checking BP medication side effects:  no Medication compliance: excellent compliance Previous BP meds: amlodipine, metoprolol, lisinopril Aspirin: no Recurrent headaches: no Visual changes: no Palpitations: no Dyspnea: no Chest pain: no Lower extremity edema: no Dizzy/lightheaded: no   MMSE - Mini Mental State Exam 09/03/2020 06/30/2015  Orientation to time 4 5  Orientation to Place 5 5  Registration 3 3  Attention/ Calculation 5 5  Recall 0 3  Language- name 2 objects 2 2  Language- repeat 1 1  Language- follow 3 step command 3 3  Language- read & follow direction 1 1  Write a sentence 1 1  Copy design 0 1  Total score 25 30     Relevant past medical, surgical, family and social history reviewed and updated as indicated. Interim medical history since our last visit  reviewed. Allergies and medications reviewed and updated.  Review of Systems  Constitutional: Negative.   Respiratory: Negative.   Cardiovascular: Negative.   Gastrointestinal: Negative.   Musculoskeletal: Negative.   Neurological: Negative.   Psychiatric/Behavioral: Negative.     Per HPI unless specifically indicated above     Objective:    BP 136/72 (BP Location: Left Arm, Cuff Size: Normal)   Pulse 67   Temp 98.6 F (37 C) (Oral)   Ht 5\' 5"  (1.651 m)   Wt 109 lb (49.4 kg)   SpO2 96%   BMI 18.14 kg/m   Wt Readings from Last 3 Encounters:  09/03/20 109 lb (49.4 kg)  07/21/20 110 lb (49.9 kg)  11/28/19 117 lb (53.1 kg)    Physical Exam Vitals and nursing note reviewed.  Constitutional:      General: She is not in acute distress.    Appearance: Normal appearance. She is cachectic. She is not ill-appearing, toxic-appearing or diaphoretic.  HENT:     Head: Normocephalic and atraumatic.     Right Ear: External ear normal.     Left Ear: External ear normal.     Nose: Nose normal.     Mouth/Throat:     Mouth: Mucous membranes are moist.     Pharynx: Oropharynx is clear.  Eyes:     General: No scleral icterus.       Right eye: No discharge.        Left eye: No discharge.     Extraocular Movements:  Extraocular movements intact.     Conjunctiva/sclera: Conjunctivae normal.     Pupils: Pupils are equal, round, and reactive to light.  Cardiovascular:     Rate and Rhythm: Normal rate and regular rhythm.     Pulses: Normal pulses.     Heart sounds: Normal heart sounds. No murmur heard.  No friction rub. No gallop.   Pulmonary:     Effort: Pulmonary effort is normal. No respiratory distress.     Breath sounds: Normal breath sounds. No stridor. No wheezing, rhonchi or rales.  Chest:     Chest wall: No tenderness.  Musculoskeletal:        General: Normal range of motion.     Cervical back: Normal range of motion and neck supple.  Skin:    General: Skin is warm and  dry.     Capillary Refill: Capillary refill takes less than 2 seconds.     Coloration: Skin is not jaundiced or pale.     Findings: No bruising, erythema, lesion or rash.  Neurological:     General: No focal deficit present.     Mental Status: She is alert and oriented to person, place, and time. Mental status is at baseline.  Psychiatric:        Mood and Affect: Mood normal.        Behavior: Behavior normal.        Thought Content: Thought content normal.        Judgment: Judgment normal.     Results for orders placed or performed in visit on 07/21/20  TSH  Result Value Ref Range   TSH 10.200 (H) 0.450 - 4.500 uIU/mL  CBC with Differential/Platelet  Result Value Ref Range   WBC 8.1 3.4 - 10.8 x10E3/uL   RBC 4.41 3.77 - 5.28 x10E6/uL   Hemoglobin 13.6 11.1 - 15.9 g/dL   Hematocrit 41.0 34.0 - 46.6 %   MCV 93 79 - 97 fL   MCH 30.8 26.6 - 33.0 pg   MCHC 33.2 31 - 35 g/dL   RDW 13.4 11.7 - 15.4 %   Platelets 173 150 - 450 x10E3/uL   Neutrophils 68 Not Estab. %   Lymphs 18 Not Estab. %   Monocytes 12 Not Estab. %   Eos 1 Not Estab. %   Basos 1 Not Estab. %   Neutrophils Absolute 5.6 1 - 7 x10E3/uL   Lymphocytes Absolute 1.4 0 - 3 x10E3/uL   Monocytes Absolute 0.9 0 - 0 x10E3/uL   EOS (ABSOLUTE) 0.1 0.0 - 0.4 x10E3/uL   Basophils Absolute 0.0 0 - 0 x10E3/uL   Immature Granulocytes 0 Not Estab. %   Immature Grans (Abs) 0.0 0.0 - 0.1 x10E3/uL  Comprehensive metabolic panel  Result Value Ref Range   Glucose 90 65 - 99 mg/dL   BUN 36 (H) 8 - 27 mg/dL   Creatinine, Ser 1.33 (H) 0.57 - 1.00 mg/dL   GFR calc non Af Amer 38 (L) >59 mL/min/1.73   GFR calc Af Amer 43 (L) >59 mL/min/1.73   BUN/Creatinine Ratio 27 12 - 28   Sodium 136 134 - 144 mmol/L   Potassium 4.1 3.5 - 5.2 mmol/L   Chloride 99 96 - 106 mmol/L   CO2 22 20 - 29 mmol/L   Calcium 9.7 8.7 - 10.3 mg/dL   Total Protein 7.2 6.0 - 8.5 g/dL   Albumin 3.7 3.6 - 4.6 g/dL   Globulin, Total 3.5 1.5 - 4.5 g/dL    Albumin/Globulin Ratio  1.1 (L) 1.2 - 2.2   Bilirubin Total <0.2 0.0 - 1.2 mg/dL   Alkaline Phosphatase 90 48 - 121 IU/L   AST 13 0 - 40 IU/L   ALT 6 0 - 32 IU/L  Lipid Panel w/o Chol/HDL Ratio  Result Value Ref Range   Cholesterol, Total 225 (H) 100 - 199 mg/dL   Triglycerides 138 0 - 149 mg/dL   HDL 69 >39 mg/dL   VLDL Cholesterol Cal 24 5 - 40 mg/dL   LDL Chol Calc (NIH) 132 (H) 0 - 99 mg/dL  B12  Result Value Ref Range   Vitamin B-12 319 232 - 1,245 pg/mL      Assessment & Plan:   Problem List Items Addressed This Visit      Endocrine   Hypothyroidism - Primary    Likely off due to her not having her medicine for 1 week and taking a different dose. Will check labs and adjust dose as needed. Call with any concerns.       Relevant Orders   Referral to Chronic Care Management Services   TSH     Genitourinary   Benign hypertensive renal disease    Under good control on current regimen. Continue current regimen. Continue to monitor. Call with any concerns. Refills given. Labs drawn today.        Chronic kidney disease (CKD), stage III (moderate)   Relevant Orders   Basic metabolic panel     Other   Moderate protein-calorie malnutrition ([place])    Continue to monitor. Encouraged diet and exercise. Call with any concerns.        Other Visit Diagnoses    Memory loss due to medical condition       MMSE down slightly in the last 5 years, but still doing well. Likely due to her thyroid. Will continue to monitor closely.    Hair loss       Likely due to her thyroid- we will check her labs and adjust as needed. If not improving, will get her into dermatology.       Follow up plan: Return in about 6 weeks (around 10/15/2020).

## 2020-09-03 NOTE — Assessment & Plan Note (Signed)
Under good control on current regimen. Continue current regimen. Continue to monitor. Call with any concerns. Refills given. Labs drawn today.   

## 2020-09-03 NOTE — Telephone Encounter (Signed)
Patient sister Raechel Ache is calling because she did not want the patient to know she is calling. However, she does want Dr. Wynetta Emery to know that the patient is having some memory issues. And wanting Dr. Wynetta Emery to possibly to discuss with the patient without the patient knowing. Please advise CB- [phone]

## 2020-09-04 ENCOUNTER — Ambulatory Visit: Payer: Medicare HMO | Admitting: Pharmacist

## 2020-09-04 ENCOUNTER — Telehealth: Payer: Self-pay | Admitting: Pharmacist

## 2020-09-04 DIAGNOSIS — E039 Hypothyroidism, unspecified: Secondary | ICD-10-CM

## 2020-09-04 DIAGNOSIS — I129 Hypertensive chronic kidney disease with stage 1 through stage 4 chronic kidney disease, or unspecified chronic kidney disease: Secondary | ICD-10-CM

## 2020-09-04 LAB — BASIC METABOLIC PANEL
BUN/Creatinine Ratio: 22 (ref 12–28)
BUN: 32 mg/dL — ABNORMAL HIGH (ref 8–27)
CO2: 23 mmol/L (ref 20–29)
Calcium: 9.6 mg/dL (ref 8.7–10.3)
Chloride: 100 mmol/L (ref 96–106)
Creatinine, Ser: 1.48 mg/dL — ABNORMAL HIGH (ref 0.57–1.00)
GFR calc Af Amer: 38 mL/min/{1.73_m2} — ABNORMAL LOW (ref >59)
GFR calc non Af Amer: 33 mL/min/{1.73_m2} — ABNORMAL LOW (ref >59)
Glucose: 105 mg/dL — ABNORMAL HIGH (ref 65–99)
Potassium: 4.2 mmol/L (ref 3.5–5.2)
Sodium: 137 mmol/L (ref 134–144)

## 2020-09-04 LAB — TSH: TSH: 2.97 u[IU]/mL (ref 0.450–4.500)

## 2020-09-04 NOTE — Telephone Encounter (Cosign Needed)
Open in error

## 2020-09-04 NOTE — Progress Notes (Signed)
Chronic Care Management Pharmacy  Name: Cathy Solis  MRN: [id number] DOB: 08-13-1941  Chief Complaint/ HPI  Cathy Solis,  79 y.o. , female presents for their Initial CCM visit with the clinical pharmacist via telephone.  PCP : Valerie Roys, DO  Their chronic conditions include: Hypertension, Coronary Artery Disease, COPD, Chronic Kidney Disease and Hypothyroidism  Office Visits: 09/03/20- Dr. Wynetta Emery - labs, follow up tsh 07/21/20-     Medications: Outpatient Encounter Medications as of 09/04/2020  Medication Sig  . albuterol (VENTOLIN HFA) 108 (90 Base) MCG/ACT inhaler Inhale 2 puffs into the lungs every 6 (six) hours as needed for wheezing or shortness of breath. (Patient taking differently: Inhale 2 puffs into the lungs every 6 (six) hours as needed for wheezing or shortness of breath. bid)  . amLODipine (NORVASC) 10 MG tablet Take 1 tablet (10 mg total) by mouth daily.  [name] ipratropium-albuterol (DUONEB) 0.5-2.5 (3) MG/3ML SOLN Take 3 mLs by nebulization 2 (two) times daily for 4 days. DuoNeb nebulizer treatments twice a day for 4 days followed by as needed for severe shortness of breath (Patient taking differently: Take 3 mLs by nebulization 2 (two) times daily. DuoNeb nebulizer treatments twice a day for 4 days followed by as needed for severe shortness of breath Taking twice daily)  . lisinopril (ZESTRIL) 10 MG tablet Take 1 tablet (10 mg total) by mouth daily. TAKE 1 TABLET BY MOUTH EVERY DAY  . metoprolol tartrate (LOPRESSOR) 25 MG tablet Take 1 tablet (25 mg total) by mouth 2 (two) times daily.  . Fluticasone-Salmeterol (ADVAIR DISKUS) 250-50 MCG/DOSE AEPB Inhale 1 puff into the lungs 2 (two) times daily. (Patient not taking: Reported on 09/03/2020)  . levothyroxine (SYNTHROID) 150 MCG tablet Take 1 tablet (150 mcg total) by mouth daily. (Patient not taking: Reported on 09/04/2020)   No facility-administered encounter medications on file as of 09/04/2020.     Current Diagnosis/Assessment:    Goals Addressed            This Visit's Progress   . Pharmacy Care Plan       CARE PLAN ENTRY (see longitudinal plan of care for additional care plan information)  Current Barriers:  . Chronic Disease Management support, education, and care coordination needs related to Hypertension, COPD, Chronic Kidney Disease, and Hypothyroidism   Hypertension/ CKDIII BP Readings from Last 3 Encounters:  09/03/20 136/72  07/21/20 (!) 176/71  02/13/20 121/62   . Pharmacist Clinical Goal(s): o Over the next 90 days, patient will work with PharmD and providers to maintain BP goal <130/80 . Current regimen:  . Lisinopril 10 mg qd  Metoprolol tartrate 25 mg bid  --Patient taking 50 mg qam  Amlodipine 10 mg qd . Interventions: o Provided diet and exercise counseling o Discussed proper technique for checking BP at home . Patient self care activities - Over the next 90 days, patient will: o Check BP 2-3 times weekly, document, and provide at future appointments o Ensure daily salt intake < 2300 mg/day   Hypothyroidism . Pharmacist Clinical Goal(s): o Over the next 90  days, patient will work with PharmD and providers to maintain TSH levels . Current regimen:  o Levothyroxine 150 mcg qd prescribed --patient to pick up . Interventions: o [facility] o Counseled on importance of taking at the same time each day. Preferably 30 minutes before food or other meds o Reviewed next PCP appointment with patient. . Patient self care activities -  Over the next 90 days, patient will: o Pick up and begin taking 150 mcg levothyroxine dose daily o Report any questions of concerns to PharmD or PCP.   COPD . Pharmacist Clinical Goal(s) o Over the next 90 days, patient will work with PharmD and providers to optimize medication therapy and minimize symptoms . Current regimen:  . Albuterol inhaler 2 puus q 6 h prn -- Patient  taking bid . Duoneb nebules prn - Patient taking 1-2 times daily . Interventions: o Reviewed proper inhaler technique o Encouraged  smoking cessation . Patient self care activities - Over the next 90 days, patient will: o Report any questions or concerns to PharmD or PCP.  Medication management . Pharmacist Clinical Goal(s): o Over the next 90 days, patient will work with PharmD and providers to achieve optimal medication adherence . [facility] . Interventions o Comprehensive medication review performed. o Continue current medication management strategy.  o Encouraged use of pill box . Patient self care activities - Over the next 90 days, patient will: o Focus on medication adherence by  auto refill. o Take medications as prescribed o Report any questions or concerns to PharmD and/or provider(s)  Initial goal documentation           Hypertension/ CKD III   BP goal is:  <130/80  Office blood pressures are  BP Readings from Last 3 Encounters:  09/03/20 136/72  07/21/20 (!) 176/71  02/13/20 121/62   BMP Latest Ref Rng & Units 09/03/2020 07/21/2020 01/27/2020  Glucose 65 - 99 mg/dL 105(H) 90 95  BUN 8 - 27 mg/dL 32(H) 36(H) 23  Creatinine 0.57 - 1.00 mg/dL 1.48(H) 1.33(H) 1.34(H)  BUN/Creat Ratio 12 - 28 22 27 17   Sodium 134 - 144 mmol/L 137 136 132(L)  Potassium 3.5 - 5.2 mmol/L 4.2 4.1 4.1  Chloride 96 - 106 mmol/L 100 99 95(L)  CO2 20 - 29 mmol/L 23 22 23   Calcium 8.7 - 10.3 mg/dL 9.6 9.7 9.5    Patient checks BP at home infrequently Patient home BP readings are ranging: Cannot recall  Patient has failed these meds in the past: NA Patient is currently controlled on the following medications:  . Lisinopril 10 mg qd  Metoprolol tartrate 25 mg bid  --Patient taking 50 mg qam  Amlodipine 10 mg qd  We discussed diet and exercise. Patient reports using salt when cooking.Takes turns cooking with sister who lives close by or occasionally they will go out.  Patient can not recall last time she took her BP at home and states she can usually tell when her BP is elevated. She reports taking both metoprolol tabs together in the morning. Denies any dizziness or lightheadedness.  Plan  Continue current medications     Hypothyroidism   Lab Results  Component Value Date/Time   TSH 2.970 09/03/2020 03:37 PM   TSH 10.200 (H) 07/21/2020 02:42 PM   FREET4 1.59 02/18/2016 09:57 AM   FREET4 1.41 06/26/2012 08:44 AM    Patient has failed these meds in past: NA Patient is currently controlled on the following medications:  . Levothyroxine 150 mg daily - Patient has not yet picked up this dose  We discussed:  Patient unable to provide clear dosing history. Patient states she had been taking 175 mcg qod until 3 days ago when she ran out. [facility]ine 150 mcg dose sent in on 07/24/20. I have asked them to fill and left message for Mrs.  Bach to pick up. I have reviewed date and time of next PCP appointment with patient. Will discuss with PCP.  Plan  Continue current medications and check follow-up level at next appointment.    COPD   Last spirometry score: unavailble in chart No CAT scores available in chart.     Lab Results  Component Value Date/Time   EOSPCT 1.4 12/14/2017 02:45 PM   EOSABS 0.1 07/21/2020 02:42 PM     Patient has failed these meds in past: Advair? Patient is currently query  controlled on the following medications:  . Albuterol inhaler 2 puus q 6 h prn -- Patient taking bid . Duoneb nebules prn - Patient taking 1-2 times daily  Using maintenance inhaler regularly? No Frequency of rescue inhaler use:  multiple times per day --using bid  We discussed:  Patient does not have a maintenance inhaler. She reports not being able to operate one she was previously prescribed but does not recall what it was. She denies any wheezing,phlegm,  shortness of breath or limitations in activity. We discussed  smoking cessation.   Plan  Consider spirometry and CAT score at next office visit. Continue current medications  Tobacco Abuse   Tobacco Status:  Social History   Tobacco Use  Smoking Status Current Some Day Smoker  . Packs/day: 0.50  . Years: 30.00  . Pack years: 15.00  . Types: Cigarettes  . Last attempt to quit: 01/08/2019  . Years since quitting: 1.6  Smokeless Tobacco Never Used    Patient smokes After 30 minutes of waking  Previous quit attempts included: Does not remember what she has tried previously   We discussed:  Provided contact information for [place] Quit Line (1-800-QUIT-NOW) and encouraged patient to reach out to this group for support. Patient expresses desire to quit because she has 40 month old twin granddaughters. However, she is not ready to commit to a quit date. Still smoking 1/2 pack per day.  Plan  Recommend continue assessing willingness to quit at each visit   Vaccines   Reviewed and discussed patient's vaccination history.    Immunization History  Administered Date(s) Administered  . Influenza Split 08/05/2013  . Influenza, High Dose Seasonal PF 08/01/2018, 08/02/2019, 08/10/2020  . Influenza-Unspecified 08/11/2015, 09/02/2016, 08/14/2017  . PFIZER SARS-COV-2 Vaccination 12/02/2019, 12/23/2019, 07/23/2020  . Pneumococcal Conjugate-13 11/28/2014  . Pneumococcal Polysaccharide-23 06/26/2012  . Tdap 06/26/2012, 01/07/2020    Plan  Will discuss at follow up visit.  Medication Management   [facility] Uses pill box? No - she does think she needs one   We discussed: Discussed benefits of medication synchronization, packaging and delivery as well as enhanced pharmacist oversight with [facility]. However, patient does not wish to change pharmacies and states [facility] [place] [facility] [place] [facility]. She reports $0 copays.  Plan  Continue current medication management strategy    Follow up: 3 month phone visit with PharmD.   Monthly follow-up calls with CPA.  Junita Push. Kenton Kingfisher PharmD, [facility] [phone]

## 2020-09-04 NOTE — Patient Instructions (Signed)
Visit Information  It was a pleasure speaking with you today! Thank you for letting me be a part of your care team. Please call with any questions or concerns.  Goals Addressed            This Visit's Progress   . Pharmacy Care Plan       CARE PLAN ENTRY (see longitudinal plan of care for additional care plan information)  Current Barriers:  . Chronic Disease Management support, education, and care coordination needs related to Hypertension, COPD, Chronic Kidney Disease, and Hypothyroidism   Hypertension/ CKDIII BP Readings from Last 3 Encounters:  09/03/20 136/72  07/21/20 (!) 176/71  02/13/20 121/62   . Pharmacist Clinical Goal(s): o Over the next 90 days, patient will work with PharmD and providers to maintain BP goal <130/80 . Current regimen:  . Lisinopril 10 mg qd  Metoprolol tartrate 25 mg bid  --Patient taking 50 mg qam  Amlodipine 10 mg qd . Interventions: o Provided diet and exercise counseling o Discussed proper technique for checking BP at home . Patient self care activities - Over the next 90 days, patient will: o Check BP 2-3 times weekly, document, and provide at future appointments o Ensure daily salt intake < 2300 mg/day   Hypothyroidism . Pharmacist Clinical Goal(s): o Over the next 90  days, patient will work with PharmD and providers to maintain TSH levels . Current regimen:  o Levothyroxine 150 mcg qd prescribed --patient to pick up . Interventions: o [facility] o Counseled on importance of taking at the same time each day. Preferably 30 minutes before food or other meds o Reviewed next PCP appointment with patient. . Patient self care activities - Over the next 90 days, patient will: o Pick up and begin taking 150 mcg levothyroxine dose daily o Report any questions of concerns to PharmD or PCP.   COPD . Pharmacist Clinical Goal(s) o Over the next 90 days, patient will work with PharmD and providers to  optimize medication therapy and minimize symptoms . Current regimen:  . Albuterol inhaler 2 puus q 6 h prn -- Patient taking bid . Duoneb nebules prn - Patient taking 1-2 times daily . Interventions: o Reviewed proper inhaler technique o Encouraged  smoking cessation . Patient self care activities - Over the next 90 days, patient will: o Report any questions or concerns to PharmD or PCP.  Medication management . Pharmacist Clinical Goal(s): o Over the next 90 days, patient will work with PharmD and providers to achieve optimal medication adherence . [facility] . Interventions o Comprehensive medication review performed. o Continue current medication management strategy.  o Encouraged use of pill box . Patient self care activities - Over the next 90 days, patient will: o Focus on medication adherence by  auto refill. o Take medications as prescribed o Report any questions or concerns to PharmD and/or provider(s)  Initial goal documentation        Ms. Huckeby was given information about Chronic Care Management services today including:  1. CCM service includes personalized support from designated clinical staff supervised by her physician, including individualized plan of care and coordination with other care providers 2. 24/7 contact phone numbers for assistance for urgent and routine care needs. 3. Standard insurance, coinsurance, copays and deductibles apply for chronic care management only during months in which we provide at least 20 minutes of these services. Most insurances cover these services at 100%, however patients may  be responsible for any copay, coinsurance and/or deductible if applicable. This service may help you avoid the need for more expensive face-to-face services. 4. Only one practitioner may furnish and bill the service in a calendar month. 5. The patient may stop CCM services at any time (effective at the end of the month) by phone call to the office  staff.  Patient agreed to services and verbal consent obtained.   The patient verbalized understanding of instructions provided today and agreed to receive a mailed copy of patient instruction and/or educational materials. Telephone follow up appointment with pharmacy team member scheduled for: 11/18/20  Junita Push. Kenton Kingfisher PharmD, [facility] Clinical Pharmacist [phone]

## 2020-09-06 ENCOUNTER — Other Ambulatory Visit: Payer: Self-pay | Admitting: Family Medicine

## 2020-09-06 MED ORDER — LEVOTHYROXINE SODIUM 150 MCG PO TABS: 150.0000 ug | ORAL_TABLET | Freq: Every day | ORAL | 3 refills | Status: AC

## 2020-09-08 ENCOUNTER — Ambulatory Visit (INDEPENDENT_AMBULATORY_CARE_PROVIDER_SITE_OTHER): Payer: Medicare HMO | Admitting: General Practice

## 2020-09-08 DIAGNOSIS — N183 Chronic kidney disease, stage 3 unspecified: Secondary | ICD-10-CM | POA: Diagnosis not present

## 2020-09-08 DIAGNOSIS — I129 Hypertensive chronic kidney disease with stage 1 through stage 4 chronic kidney disease, or unspecified chronic kidney disease: Secondary | ICD-10-CM | POA: Diagnosis not present

## 2020-09-08 DIAGNOSIS — E782 Mixed hyperlipidemia: Secondary | ICD-10-CM

## 2020-09-08 DIAGNOSIS — E039 Hypothyroidism, unspecified: Secondary | ICD-10-CM | POA: Diagnosis not present

## 2020-09-08 DIAGNOSIS — J410 Simple chronic bronchitis: Secondary | ICD-10-CM | POA: Diagnosis not present

## 2020-09-08 DIAGNOSIS — I2510 Atherosclerotic heart disease of native coronary artery without angina pectoris: Secondary | ICD-10-CM

## 2020-09-08 NOTE — Chronic Care Management (AMB) (Signed)
Chronic Care Management   Initial Visit Note  09/08/2020 Name: MAISEY DEANDRADE MRN: [id number] DOB: [birth year]  Referred by: Valerie Roys, DO Reason for referral : Chronic Care Management (RNCM Initial Outreach for Chronic Disease Management and Care Coordination Needs)   KEYARAH MCROY is a 79 y.o. year old female who is a primary care patient of Valerie Roys, DO. The CCM team was consulted for assistance with chronic disease management and care coordination needs related to CAD, HTN, HLD, COPD, CKD Stage 3 and hypothyroidism  Review of patient status, including review of consultants reports, relevant laboratory and other test results, and collaboration with appropriate care team members and the patient's provider was performed as part of comprehensive patient evaluation and provision of chronic care management services.    SDOH (Social Determinants of Health) assessments performed: Yes See Care Plan activities for detailed interventions related to SDOH  SDOH Interventions     Most Recent Value  SDOH Interventions  Physical Activity Interventions Other (Comments)  [no structured activity]  Social Connections Interventions Other (Comment)  [the patient states she has a good support system. Denies any concerns. States she sees her family daily]       Medications: Outpatient Encounter Medications as of 09/08/2020  Medication Sig  . albuterol (VENTOLIN HFA) 108 (90 Base) MCG/ACT inhaler Inhale 2 puffs into the lungs every 6 (six) hours as needed for wheezing or shortness of breath. (Patient taking differently: Inhale 2 puffs into the lungs every 6 (six) hours as needed for wheezing or shortness of breath. bid)  . amLODipine (NORVASC) 10 MG tablet Take 1 tablet (10 mg total) by mouth daily.  . Fluticasone-Salmeterol (ADVAIR DISKUS) 250-50 MCG/DOSE AEPB Inhale 1 puff into the lungs 2 (two) times daily. (Patient not taking: Reported on 09/03/2020)  . ipratropium-albuterol (DUONEB)  0.5-2.5 (3) MG/3ML SOLN Take 3 mLs by nebulization 2 (two) times daily for 4 days. DuoNeb nebulizer treatments twice a day for 4 days followed by as needed for severe shortness of breath (Patient taking differently: Take 3 mLs by nebulization 2 (two) times daily. DuoNeb nebulizer treatments twice a day for 4 days followed by as needed for severe shortness of breath Taking twice daily)  . levothyroxine (SYNTHROID) 150 MCG tablet Take 1 tablet (150 mcg total) by mouth daily.  [name] lisinopril (ZESTRIL) 10 MG tablet Take 1 tablet (10 mg total) by mouth daily. TAKE 1 TABLET BY MOUTH EVERY DAY  . metoprolol tartrate (LOPRESSOR) 25 MG tablet Take 1 tablet (25 mg total) by mouth 2 (two) times daily.   No facility-administered encounter medications on file as of 09/08/2020.     Objective:  BP Readings from Last 3 Encounters:  09/03/20 136/72  07/21/20 (!) 176/71  02/13/20 121/62    Goals Addressed              This Visit's Progress   .  RNCM: Pt-"I have my thyroid medication now and I am taking it" (pt-stated)        CARE PLAN ENTRY (see longtitudinal plan of care for additional care plan information)  Current Barriers:  . Chronic Disease Management support, education, and care coordination needs related to CAD, HTN, HLD, CKD Stage 3, and hypothyroidism   Clinical Goal(s) related to CAD, HTN, HLD, COPD, CKD Stage 3, and hypothyroidism  :  Over the next 120 days, patient will:  . Work with the care management team to address educational, disease management, and care coordination needs  . Begin  or continue self health monitoring activities as directed today Measure and record blood pressure 2/3 times per week and adhere to a heart health diet and take medications as ordered . Call provider office for new or worsened signs and symptoms Blood pressure findings outside established parameters, Shortness of breath, New or worsened symptom related to HLD/COPD/CKD3/CAD/HTN/ Memory loss, and other  chronic conditions including hypothyroidism  . Call care management team with questions or concerns . Verbalize basic understanding of patient centered plan of care established today  Interventions related to CAD, HTN, HLD, COPD, CKD Stage 3, and hypothyroidism :  . Evaluation of current treatment plans and patient's adherence to plan as established by provider.  The patient endorses today that she has picked up her medications from the pharmacy and she is taking as prescribed. She denies any issues related to medication needs at this time.  . Assessed patient understanding of disease states.  The patient verbalized understanding of chronic conditions.  She denies problems at this time related to her chronic conditions.  . Assessed patient's education and care coordination needs. Review of SDOH.  The patient continues to drive and has plenty of food. She denies any needs related to [facility] or education.  . Provided disease specific education to patient. Review of patients smoking habits. The patient states she has quit several times in the past but has picked it back up. The patient states she is not interested in quitting at this time.  Nash Dimmer with appropriate clinical care team members regarding patient needs.  The patient is currently working with the CCM pharmacist. Review of LCSW role and the patient denies any needs at this time. Supplied the patient with the RNCM number and also let the patient know there were resources available in the community if she needed community support. The patient agrees to work with the [facility].   Patient Self Care Activities related to CAD, HTN, HLD, COPD, CKD Stage 3, and hypothyroidism  :  . Patient is unable to independently self-manage chronic health conditions  Initial goal documentation         Ms. Shaheen was given information about Chronic Care Management services today including:  1. CCM service includes personalized support from designated clinical  staff supervised by her physician, including individualized plan of care and coordination with other care providers 2. 24/7 contact phone numbers for assistance for urgent and routine care needs. 3. Service will only be billed when office clinical staff spend 20 minutes or more in a month to coordinate care. 4. Only one practitioner may furnish and bill the service in a calendar month. 5. The patient may stop CCM services at any time (effective at the end of the month) by phone call to the office staff. 6. The patient will be responsible for cost sharing (co-pay) of up to 20% of the service fee (after annual deductible is met).  Patient agreed to services and verbal consent obtained.   Plan:   Telephone follow up appointment with care management team member scheduled for: 10-20-2020 at 12:15 pm  Noreene Larsson RN, MSN, [facility] Mobile: [phone]

## 2020-09-08 NOTE — Patient Instructions (Signed)
Visit Information  Goals Addressed              This Visit's Progress     RNCM: Pt-"I have my thyroid medication now and I am taking it" (pt-stated)        CARE PLAN ENTRY (see longtitudinal plan of care for additional care plan information)  Current Barriers:   Chronic Disease Management support, education, and care coordination needs related to CAD, HTN, HLD, CKD Stage 3, and hypothyroidism   Clinical Goal(s) related to CAD, HTN, HLD, COPD, CKD Stage 3, and hypothyroidism  :  Over the next 120 days, patient will:   Work with the care management team to address educational, disease management, and care coordination needs   Begin or continue self health monitoring activities as directed today Measure and record blood pressure 2/3 times per week and adhere to a heart health diet and take medications as ordered  Call provider office for new or worsened signs and symptoms Blood pressure findings outside established parameters, Shortness of breath, New or worsened symptom related to HLD/COPD/CKD3/CAD/HTN/ Memory loss, and other chronic conditions including hypothyroidism   Call care management team with questions or concerns  Verbalize basic understanding of patient centered plan of care established today  Interventions related to CAD, HTN, HLD, COPD, CKD Stage 3, and hypothyroidism :   Evaluation of current treatment plans and patient's adherence to plan as established by provider.  The patient endorses today that she has picked up her medications from the pharmacy and she is taking as prescribed. She denies any issues related to medication needs at this time.   Assessed patient understanding of disease states.  The patient verbalized understanding of chronic conditions.  She denies problems at this time related to her chronic conditions.   Assessed patient's education and care coordination needs. Review of SDOH.  The patient continues to drive and has plenty of food. She denies any  needs related to [facility] or education.   Provided disease specific education to patient. Review of patients smoking habits. The patient states she has quit several times in the past but has picked it back up. The patient states she is not interested in quitting at this time.   Collaborated with appropriate clinical care team members regarding patient needs.  The patient is currently working with the CCM pharmacist. Review of LCSW role and the patient denies any needs at this time. Supplied the patient with the RNCM number and also let the patient know there were resources available in the community if she needed community support. The patient agrees to work with the [facility].   Patient Self Care Activities related to CAD, HTN, HLD, COPD, CKD Stage 3, and hypothyroidism  :   Patient is unable to independently self-manage chronic health conditions  Initial goal documentation        Ms. Kreuser was given information about Chronic Care Management services today including:  1. CCM service includes personalized support from designated clinical staff supervised by her physician, including individualized plan of care and coordination with other care providers 2. 24/7 contact phone numbers for assistance for urgent and routine care needs. 3. Service will only be billed when office clinical staff spend 20 minutes or more in a month to coordinate care. 4. Only one practitioner may furnish and bill the service in a calendar month. 5. The patient may stop CCM services at any time (effective at the end of the month) by phone call to the office staff. 6. The  patient will be responsible for cost sharing (co-pay) of up to 20% of the service fee (after annual deductible is met).  Patient agreed to services and verbal consent obtained.   Patient verbalizes understanding of instructions provided today.   Telephone follow up appointment with care management team member scheduled for: 10-20-2020 at 12:15 pm  [place], MSN, [facility] Mobile: [phone]

## 2020-09-27 ENCOUNTER — Inpatient Hospital Stay
Admission: EM | Admit: 2020-09-27 | Discharge: 2020-10-05 | DRG: 689 | Disposition: A | Discharge status: Skilled Nursing Facility | Payer: Medicare HMO | Attending: Internal Medicine | Admitting: Internal Medicine

## 2020-09-27 ENCOUNTER — Other Ambulatory Visit: Payer: Self-pay

## 2020-09-27 ENCOUNTER — Emergency Department: Payer: Medicare HMO

## 2020-09-27 DIAGNOSIS — F17210 Nicotine dependence, cigarettes, uncomplicated: Secondary | ICD-10-CM | POA: Diagnosis present

## 2020-09-27 DIAGNOSIS — R54 Age-related physical debility: Secondary | ICD-10-CM | POA: Diagnosis present

## 2020-09-27 DIAGNOSIS — N10 Acute tubulo-interstitial nephritis: Principal | ICD-10-CM | POA: Diagnosis present

## 2020-09-27 DIAGNOSIS — N1832 Chronic kidney disease, stage 3b: Secondary | ICD-10-CM | POA: Diagnosis not present

## 2020-09-27 DIAGNOSIS — A419 Sepsis, unspecified organism: Secondary | ICD-10-CM

## 2020-09-27 DIAGNOSIS — Z8673 Personal history of transient ischemic attack (TIA), and cerebral infarction without residual deficits: Secondary | ICD-10-CM

## 2020-09-27 DIAGNOSIS — Z20822 Contact with and (suspected) exposure to covid-19: Secondary | ICD-10-CM | POA: Diagnosis present

## 2020-09-27 DIAGNOSIS — Z8249 Family history of ischemic heart disease and other diseases of the circulatory system: Secondary | ICD-10-CM

## 2020-09-27 DIAGNOSIS — I5031 Acute diastolic (congestive) heart failure: Secondary | ICD-10-CM | POA: Diagnosis present

## 2020-09-27 DIAGNOSIS — M810 Age-related osteoporosis without current pathological fracture: Secondary | ICD-10-CM | POA: Diagnosis present

## 2020-09-27 DIAGNOSIS — R0902 Hypoxemia: Secondary | ICD-10-CM | POA: Diagnosis not present

## 2020-09-27 DIAGNOSIS — I6523 Occlusion and stenosis of bilateral carotid arteries: Secondary | ICD-10-CM | POA: Diagnosis not present

## 2020-09-27 DIAGNOSIS — I480 Paroxysmal atrial fibrillation: Secondary | ICD-10-CM | POA: Diagnosis not present

## 2020-09-27 DIAGNOSIS — I10 Essential (primary) hypertension: Secondary | ICD-10-CM | POA: Diagnosis not present

## 2020-09-27 DIAGNOSIS — E785 Hyperlipidemia, unspecified: Secondary | ICD-10-CM | POA: Diagnosis present

## 2020-09-27 DIAGNOSIS — M545 Low back pain, unspecified: Secondary | ICD-10-CM | POA: Diagnosis not present

## 2020-09-27 DIAGNOSIS — Z7982 Long term (current) use of aspirin: Secondary | ICD-10-CM

## 2020-09-27 DIAGNOSIS — J9621 Acute and chronic respiratory failure with hypoxia: Secondary | ICD-10-CM | POA: Diagnosis not present

## 2020-09-27 DIAGNOSIS — R000 Tachycardia, unspecified: Secondary | ICD-10-CM | POA: Diagnosis not present

## 2020-09-27 DIAGNOSIS — Z923 Personal history of irradiation: Secondary | ICD-10-CM

## 2020-09-27 DIAGNOSIS — N12 Tubulo-interstitial nephritis, not specified as acute or chronic: Secondary | ICD-10-CM | POA: Diagnosis present

## 2020-09-27 DIAGNOSIS — Z720 Tobacco use: Secondary | ICD-10-CM | POA: Diagnosis not present

## 2020-09-27 DIAGNOSIS — N179 Acute kidney failure, unspecified: Secondary | ICD-10-CM | POA: Diagnosis present

## 2020-09-27 DIAGNOSIS — R5381 Other malaise: Secondary | ICD-10-CM | POA: Diagnosis present

## 2020-09-27 DIAGNOSIS — Z888 Allergy status to other drugs, medicaments and biological substances status: Secondary | ICD-10-CM

## 2020-09-27 DIAGNOSIS — K920 Hematemesis: Secondary | ICD-10-CM | POA: Diagnosis not present

## 2020-09-27 DIAGNOSIS — I272 Pulmonary hypertension, unspecified: Secondary | ICD-10-CM | POA: Diagnosis present

## 2020-09-27 DIAGNOSIS — J410 Simple chronic bronchitis: Secondary | ICD-10-CM

## 2020-09-27 DIAGNOSIS — Z905 Acquired absence of kidney: Secondary | ICD-10-CM

## 2020-09-27 DIAGNOSIS — M549 Dorsalgia, unspecified: Secondary | ICD-10-CM | POA: Diagnosis not present

## 2020-09-27 DIAGNOSIS — I4510 Unspecified right bundle-branch block: Secondary | ICD-10-CM | POA: Diagnosis present

## 2020-09-27 DIAGNOSIS — E039 Hypothyroidism, unspecified: Secondary | ICD-10-CM | POA: Diagnosis present

## 2020-09-27 DIAGNOSIS — I130 Hypertensive heart and chronic kidney disease with heart failure and stage 1 through stage 4 chronic kidney disease, or unspecified chronic kidney disease: Secondary | ICD-10-CM | POA: Diagnosis present

## 2020-09-27 DIAGNOSIS — E871 Hypo-osmolality and hyponatremia: Secondary | ICD-10-CM | POA: Diagnosis not present

## 2020-09-27 DIAGNOSIS — N1330 Unspecified hydronephrosis: Secondary | ICD-10-CM | POA: Diagnosis not present

## 2020-09-27 DIAGNOSIS — R52 Pain, unspecified: Secondary | ICD-10-CM | POA: Diagnosis not present

## 2020-09-27 DIAGNOSIS — I77811 Abdominal aortic ectasia: Secondary | ICD-10-CM | POA: Diagnosis not present

## 2020-09-27 DIAGNOSIS — Z79890 Hormone replacement therapy (postmenopausal): Secondary | ICD-10-CM

## 2020-09-27 DIAGNOSIS — I6529 Occlusion and stenosis of unspecified carotid artery: Secondary | ICD-10-CM | POA: Diagnosis present

## 2020-09-27 DIAGNOSIS — N390 Urinary tract infection, site not specified: Secondary | ICD-10-CM | POA: Diagnosis present

## 2020-09-27 DIAGNOSIS — K922 Gastrointestinal hemorrhage, unspecified: Secondary | ICD-10-CM

## 2020-09-27 DIAGNOSIS — J449 Chronic obstructive pulmonary disease, unspecified: Secondary | ICD-10-CM | POA: Diagnosis present

## 2020-09-27 DIAGNOSIS — N183 Chronic kidney disease, stage 3 unspecified: Secondary | ICD-10-CM | POA: Diagnosis present

## 2020-09-27 DIAGNOSIS — R634 Abnormal weight loss: Secondary | ICD-10-CM | POA: Diagnosis present

## 2020-09-27 DIAGNOSIS — Z823 Family history of stroke: Secondary | ICD-10-CM

## 2020-09-27 DIAGNOSIS — I4891 Unspecified atrial fibrillation: Secondary | ICD-10-CM | POA: Diagnosis not present

## 2020-09-27 DIAGNOSIS — N200 Calculus of kidney: Secondary | ICD-10-CM | POA: Diagnosis not present

## 2020-09-27 DIAGNOSIS — K4090 Unilateral inguinal hernia, without obstruction or gangrene, not specified as recurrent: Secondary | ICD-10-CM | POA: Diagnosis not present

## 2020-09-27 DIAGNOSIS — Z79899 Other long term (current) drug therapy: Secondary | ICD-10-CM

## 2020-09-27 DIAGNOSIS — I482 Chronic atrial fibrillation, unspecified: Secondary | ICD-10-CM | POA: Diagnosis present

## 2020-09-27 DIAGNOSIS — E1122 Type 2 diabetes mellitus with diabetic chronic kidney disease: Secondary | ICD-10-CM | POA: Diagnosis present

## 2020-09-27 DIAGNOSIS — Z853 Personal history of malignant neoplasm of breast: Secondary | ICD-10-CM

## 2020-09-27 DIAGNOSIS — R079 Chest pain, unspecified: Secondary | ICD-10-CM

## 2020-09-27 DIAGNOSIS — N135 Crossing vessel and stricture of ureter without hydronephrosis: Secondary | ICD-10-CM

## 2020-09-27 LAB — URINALYSIS, COMPLETE (UACMP) WITH MICROSCOPIC
Bilirubin Urine: NEGATIVE
Glucose, UA: NEGATIVE mg/dL
Ketones, ur: 5 mg/dL — AB
Nitrite: POSITIVE — AB
Protein, ur: >=300 mg/dL — AB
RBC / HPF: >50 RBC/hpf — ABNORMAL HIGH (ref 0–5)
Specific Gravity, Urine: 1.013 (ref 1.005–1.030)
WBC, UA: >50 WBC/hpf — ABNORMAL HIGH (ref 0–5)
pH: 9 — ABNORMAL HIGH (ref 5.0–8.0)

## 2020-09-27 LAB — CBC
HCT: 38.9 % (ref 36.0–46.0)
Hemoglobin: 13 g/dL (ref 12.0–15.0)
MCH: 30.6 pg (ref 26.0–34.0)
MCHC: 33.4 g/dL (ref 30.0–36.0)
MCV: 91.5 fL (ref 80.0–100.0)
Platelets: 353 10*3/uL (ref 150–400)
RBC: 4.25 MIL/uL (ref 3.87–5.11)
RDW: 13.5 % (ref 11.5–15.5)
WBC: 16.4 10*3/uL — ABNORMAL HIGH (ref 4.0–10.5)
nRBC: 0 % (ref 0.0–0.2)

## 2020-09-27 LAB — RESPIRATORY PANEL BY RT PCR (FLU A&B, COVID)
Influenza A by PCR: NEGATIVE
Influenza B by PCR: NEGATIVE
SARS Coronavirus 2 by RT PCR: NEGATIVE

## 2020-09-27 LAB — BASIC METABOLIC PANEL
Anion gap: 15 (ref 5–15)
BUN: 82 mg/dL — ABNORMAL HIGH (ref 8–23)
CO2: 19 mmol/L — ABNORMAL LOW (ref 22–32)
Calcium: 9.4 mg/dL (ref 8.9–10.3)
Chloride: 100 mmol/L (ref 98–111)
Creatinine, Ser: 2.12 mg/dL — ABNORMAL HIGH (ref 0.44–1.00)
GFR, Estimated: 23 mL/min — ABNORMAL LOW (ref >60)
Glucose, Bld: 139 mg/dL — ABNORMAL HIGH (ref 70–99)
Potassium: 4.7 mmol/L (ref 3.5–5.1)
Sodium: 134 mmol/L — ABNORMAL LOW (ref 135–145)

## 2020-09-27 LAB — LACTIC ACID, PLASMA
Lactic Acid, Venous: 1.5 mmol/L (ref 0.5–1.9)
Lactic Acid, Venous: 1.5 mmol/L (ref 0.5–1.9)

## 2020-09-27 MED ORDER — SODIUM CHLORIDE 0.9 % IV SOLN
2.0000 g | Freq: Once | INTRAVENOUS | Status: AC
  Administered 2020-09-27: 2 g via INTRAVENOUS
  Filled 2020-09-27: qty 20

## 2020-09-27 MED ORDER — ALBUTEROL SULFATE HFA 108 (90 BASE) MCG/ACT IN AERS
2.0000 | INHALATION_SPRAY | Freq: Four times a day (QID) | RESPIRATORY_TRACT | Status: DC | PRN
  Filled 2020-09-27: qty 6.7

## 2020-09-27 MED ORDER — HYDRALAZINE HCL 25 MG PO TABS: 25.0000 mg | ORAL_TABLET | Freq: Three times a day (TID) | ORAL | Status: DC | PRN

## 2020-09-27 MED ORDER — FENTANYL CITRATE (PF) 100 MCG/2ML IJ SOLN
25.0000 ug | Freq: Once | INTRAMUSCULAR | Status: AC
  Administered 2020-09-27: 25 ug via INTRAVENOUS
  Filled 2020-09-27: qty 2

## 2020-09-27 MED ORDER — SODIUM CHLORIDE 0.9 % IV BOLUS
1000.0000 mL | Freq: Once | INTRAVENOUS | Status: AC
  Administered 2020-09-27: 1000 mL via INTRAVENOUS

## 2020-09-27 MED ORDER — ONDANSETRON HCL 4 MG/2ML IJ SOLN
4.0000 mg | Freq: Once | INTRAMUSCULAR | Status: AC
  Administered 2020-09-27: 4 mg via INTRAVENOUS
  Filled 2020-09-27: qty 2

## 2020-09-27 MED ORDER — SODIUM CHLORIDE 0.9 % IV SOLN: INTRAVENOUS | Status: AC

## 2020-09-27 MED ORDER — ONDANSETRON HCL 4 MG/2ML IJ SOLN: 4.0000 mg | Freq: Three times a day (TID) | INTRAMUSCULAR | Status: AC | PRN

## 2020-09-27 MED ORDER — SODIUM CHLORIDE 0.9 % IV SOLN: Freq: Once | INTRAVENOUS | Status: AC

## 2020-09-27 MED ORDER — METOPROLOL TARTRATE 25 MG PO TABS
25.0000 mg | ORAL_TABLET | Freq: Two times a day (BID) | ORAL | Status: DC
  Administered 2020-09-27 – 2020-09-29 (×5): 25 mg via ORAL
  Filled 2020-09-27 (×5): qty 1

## 2020-09-27 MED ORDER — ACETAMINOPHEN 650 MG RE SUPP: 650.0000 mg | Freq: Four times a day (QID) | RECTAL | Status: DC | PRN

## 2020-09-27 MED ORDER — AMLODIPINE BESYLATE 10 MG PO TABS
10.0000 mg | ORAL_TABLET | Freq: Every day | ORAL | Status: DC
  Administered 2020-09-28 – 2020-10-05 (×7): 10 mg via ORAL
  Filled 2020-09-27 (×8): qty 1

## 2020-09-27 MED ORDER — HEPARIN SODIUM (PORCINE) 5000 UNIT/ML IJ SOLN
5000.0000 [IU] | Freq: Three times a day (TID) | INTRAMUSCULAR | Status: DC
  Administered 2020-09-27 – 2020-09-29 (×5): 5000 [IU] via SUBCUTANEOUS
  Filled 2020-09-27 (×5): qty 1

## 2020-09-27 MED ORDER — LEVOTHYROXINE SODIUM 50 MCG PO TABS
150.0000 ug | ORAL_TABLET | Freq: Every day | ORAL | Status: DC
  Administered 2020-09-28 – 2020-10-02 (×5): 150 ug via ORAL
  Filled 2020-09-27 (×5): qty 1

## 2020-09-27 MED ORDER — ASPIRIN EC 81 MG PO TBEC
81.0000 mg | DELAYED_RELEASE_TABLET | Freq: Every day | ORAL | Status: DC
  Administered 2020-09-28 – 2020-09-29 (×2): 81 mg via ORAL
  Filled 2020-09-27 (×2): qty 1

## 2020-09-27 MED ORDER — ONDANSETRON HCL 4 MG PO TABS: 4.0000 mg | ORAL_TABLET | Freq: Four times a day (QID) | ORAL | Status: AC | PRN

## 2020-09-27 MED ORDER — ACETAMINOPHEN 325 MG PO TABS
650.0000 mg | ORAL_TABLET | Freq: Four times a day (QID) | ORAL | Status: DC | PRN
  Administered 2020-09-27 – 2020-10-04 (×4): 650 mg via ORAL
  Filled 2020-09-27 (×5): qty 2

## 2020-09-27 MED ORDER — SODIUM CHLORIDE 0.9 % IV SOLN
1.0000 g | INTRAVENOUS | Status: DC
  Administered 2020-09-28 – 2020-09-29 (×2): 1 g via INTRAVENOUS
  Filled 2020-09-27: qty 10
  Filled 2020-09-27: qty 1

## 2020-09-27 MED ORDER — ACETAMINOPHEN 325 MG PO TABS
650.0000 mg | ORAL_TABLET | Freq: Once | ORAL | Status: AC
  Administered 2020-09-27: 650 mg via ORAL
  Filled 2020-09-27: qty 2

## 2020-09-27 NOTE — ED Notes (Signed)
Report called to RN on floor. No c/o at this time.

## 2020-09-27 NOTE — ED Notes (Signed)
Friend of patient, her emergency contact: Raechel Ache [phone]; Caryn Section (friend) [phone].

## 2020-09-27 NOTE — Consult Note (Signed)
Cardiology Consultation:   Patient ID: Cathy Solis MRN: [id number]; DOB: Sep 12, 1941  Admit date: 09/27/2020 Date of Consult: 09/27/2020  Primary Care Provider: Valerie Roys, DO [facility] Cardiologist: new, Dr. Garen Lah rounding [place] Electrophysiologist:  None    Patient Profile:   Cathy Solis is a 79 y.o. female with a hx of hypertension, COPD, current smoker, who is being seen today for the evaluation of atrial fibrillation at the request of Dr. Tobie Poet.  History of Present Illness:   Cathy Solis is an 79 year old female with history of hypertension, COPD, current smoker, CKD, right nephrectomy in 2009 who presents to the hospital due to right back pain, weakness, poor appetite over the past 3 days.  Patient also states losing some weight over the last several months.  She denies fevers or chills.  Denies blood in her urine or stool.  Denies chest pain, palpitations, or any history of heart disease.  Patient in room with sister who states patient has not been eating much of late.  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  Patient uses a cane at home.  Has fallen at least twice in the past year, last episode about a month ago.  Work-up in the ED showed severe left hydronephrosis, with surrounding fat haziness.  She was diagnosed with pyelonephritis and started on IV antibiotics.  EKG in the ED showed atrial fibrillation.     Past Medical History:  Diagnosis Date  . Breast cancer [facility]) 2006   Left breast, s/p radiation  . 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 Hypertension   . Kidney problem    Undeveloped R kidney  . 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 Personal history of tobacco use, presenting hazards to health 03/18/2016  . Stroke [facility])    residual left sided weakness  . Toe infection    followed by Dr. Jens Som    Past Surgical History:  Procedure Laterality Date  .  ABDOMINAL HYSTERECTOMY    . BLADDER REPAIR    . BREAST LUMPECTOMY Left   . BREAST SURGERY     left  . CAROTID ENDARTERECTOMY  2008   left  . COLONOSCOPY  2008  . EYE SURGERY Right 2013   cataract  . KIDNEY SURGERY  1949     Home Medications:  Prior to Admission medications   Medication Sig Start Date End Date Taking? Authorizing Provider  albuterol (VENTOLIN HFA) 108 (90 Base) MCG/ACT inhaler Inhale 2 puffs into the lungs every 6 (six) hours as needed for wheezing or shortness of breath. 07/21/20  Yes Johnson, Megan P, DO  amLODipine (NORVASC) 10 MG tablet Take 1 tablet (10 mg total) by mouth daily. 07/21/20  Yes Johnson, Megan P, DO  aspirin EC 81 MG tablet Take 81 mg by mouth daily. Swallow whole.   Yes [provider]  ipratropium-albuterol (DUONEB) 0.5-2.5 (3) MG/3ML SOLN Take 3 mLs by nebulization 2 (two) times daily for 4 days. DuoNeb nebulizer treatments twice a day for 4 days followed by as needed for severe shortness of breath Patient taking differently: Take 3 mLs by nebulization 2 (two) times daily.  01/27/20 09/27/20 Yes Johnson, Megan P, DO  levothyroxine (SYNTHROID) 150 MCG tablet Take 1 tablet (150 mcg total) by mouth daily. 09/06/20  Yes Johnson, Megan P, DO  lisinopril (ZESTRIL) 10 MG tablet Take 1 tablet (10 mg total) by mouth daily. TAKE 1  TABLET BY MOUTH EVERY DAY 07/21/20  Yes Johnson, Megan P, DO  metoprolol tartrate (LOPRESSOR) 25 MG tablet Take 1 tablet (25 mg total) by mouth 2 (two) times daily. 07/21/20  Yes Valerie Roys, DO    Inpatient Medications: Scheduled Meds: . [START ON 09/28/2020] amLODipine  10 mg Oral Daily  . [START ON 09/28/2020] aspirin EC  81 mg Oral Daily  . heparin  5,000 Units Subcutaneous Q8H  . [START ON 09/28/2020] levothyroxine  150 mcg Oral Daily  . metoprolol tartrate  25 mg Oral BID   Continuous Infusions: . sodium chloride    . [START ON 09/28/2020] cefTRIAXone (ROCEPHIN)  IV     PRN Meds: acetaminophen **OR** acetaminophen,  albuterol, hydrALAZINE, ondansetron **OR** ondansetron (ZOFRAN) IV  Allergies:    Allergies  Allergen Reactions  . Statins Other (See Comments)    Myalgias  . Zetia [Ezetimibe] Other (See Comments)    myalgias    Social History:   Social History   Socioeconomic History  . Marital status: Widowed    Spouse name: Not on file  . Number of children: Not on file  . Years of education: Not on file  . Highest education level: High school graduate  Occupational History  . Not on file  Tobacco Use  . Smoking status: Current Some Day Smoker    Packs/day: 0.50    Years: 30.00    Pack years: 15.00    Types: Cigarettes    Last attempt to quit: 01/08/2019    Years since quitting: 1.7  . Smokeless tobacco: Never Used  Vaping Use  . Vaping Use: Never used  Substance and Sexual Activity  . Alcohol use: No  . Drug use: No  . Sexual activity: Never  Other Topics Concern  . Not on file  Social History Narrative  . Not on file   Social Determinants of Health   Financial Resource Strain: Low Risk   . Difficulty of Paying Living Expenses: Not hard at all  Food Insecurity: No Food Insecurity  . Worried About Charity fundraiser in the Last Year: Never true  . Ran Out of Food in the Last Year: Never true  Transportation Needs: No Transportation Needs  . Lack of Transportation (Medical): No  . Lack of Transportation (Non-Medical): No  Physical Activity: Inactive  . Days of Exercise per Week: 0 days  . Minutes of Exercise per Session: 0 min  Stress: No Stress Concern Present  . Feeling of Stress : Not at all  Social Connections: Moderately Isolated  . Frequency of Communication with Friends and Family: More than three times a week  . Frequency of Social Gatherings with Friends and Family: More than three times a week  . Attends Religious Services: More than 4 times per year  . Active Member of Clubs or Organizations: No  . Attends Archivist Meetings: Never  . Marital  Status: Widowed  Intimate Partner Violence: Not At Risk  . Fear of Current or Ex-Partner: No  . Emotionally Abused: No  . Physically Abused: No  . Sexually Abused: No    Family History:    Family History  Problem Relation Age of Onset  . Stroke Mother   . Heart disease Father   . Heart attack Maternal Grandfather      ROS:  Please see the history of present illness.   All other ROS reviewed and negative.     Physical Exam/Data:   Vitals:   09/27/20 1300  09/27/20 1330 09/27/20 1400 09/27/20 1430  BP: 118/63 128/67 118/63 127/66  Pulse: 82 78 71   Resp: (!) 21 17 16  (!) 21  Temp:      TempSrc:      SpO2: 90% 90% 90%   Weight:      Height:       No intake or output data in the 24 hours ending 09/27/20 1718 Last 3 Weights 09/27/2020 09/03/2020 07/21/2020  Weight (lbs) 130 lb 109 lb 110 lb  Weight (kg) 58.968 kg 49.442 kg 49.896 kg     Body mass index is 25.39 kg/m.  General: Patient appears cachectic, soft-spoken HEENT: normal Lymph: no adenopathy Neck: no JVD Cardiac: Irregular irregular Lungs: Clear anteriorly Abd: soft, nontender, no hepatomegaly  Ext: no edema Musculoskeletal:  No deformities, BUE and BLE strength normal and equal Skin: warm and dry  Neuro:  CNs 2-12 intact, no focal abnormalities noted Psych:  Normal affect   EKG:  The EKG was personally reviewed and demonstrates: Atrial fibrillation Telemetry:  Telemetry was personally reviewed and demonstrates: Atrial fibrillation  Relevant CV Studies: Echo ordered, pending  Laboratory Data:  High Sensitivity Troponin:  No results for input(s): TROPONINIHS in the last 720 hours.   Chemistry Recent Labs  Lab 09/27/20 0856  NA 134*  K 4.7  CL 100  CO2 19*  GLUCOSE 139*  BUN 82*  CREATININE 2.12*  CALCIUM 9.4  GFRNONAA 23*  ANIONGAP 15    No results for input(s): PROT, ALBUMIN, AST, ALT, ALKPHOS, BILITOT in the last 168 hours. Hematology Recent Labs  Lab 09/27/20 0856  WBC 16.4*  RBC  4.25  HGB 13.0  HCT 38.9  MCV 91.5  MCH 30.6  MCHC 33.4  RDW 13.5  PLT 353   BNPNo results for input(s): BNP, PROBNP in the last 168 hours.  DDimer No results for input(s): DDIMER in the last 168 hours.   Radiology/Studies:  CT Renal Stone Study  Result Date: 09/27/2020 CLINICAL DATA:  Back pain for 3 days. Stage 3 chronic kidney disease. Foul-smelling urine. Remote history of unspecified kidney surgery. EXAM: CT ABDOMEN AND PELVIS WITHOUT CONTRAST TECHNIQUE: Multidetector CT imaging of the abdomen and pelvis was performed following the standard protocol without IV contrast. COMPARISON:  01/03/2014 CT abdomen/pelvis. FINDINGS: Lower chest: Mild curvilinear scarring versus atelectasis at the dependent right lung base. Coronary atherosclerosis. Hepatobiliary: Normal liver size. No liver mass. Normal gallbladder with no radiopaque cholelithiasis. No biliary ductal dilatation. Pancreas: Normal, with no mass or duct dilation. Spleen: Normal size. No mass. Adrenals/Urinary Tract: No discrete adrenal nodules. Absent right kidney. Severe left hydronephrosis with prominently dilated extrarenal left renal pelvis, worsened since 2015 CT. New urothelial wall thickening throughout the left renal pelvis with surrounding fat haziness. New mild layering hyperdense material with punctate stones in the left renal pelvis and left renal collecting system. No contour deforming left renal masses. Abrupt caliber transition at the left ureteropelvic junction. No left ureteral stones. Mild diffuse bladder wall thickening. No bladder stones. Stomach/Bowel: Normal non-distended stomach. Right inguinal hernia contains a right pelvic small bowel loop. No small bowel dilatation, wall thickening or pneumatosis. Appendix not discretely visualized. Normal large bowel with no diverticulosis, large bowel wall thickening or pericolonic fat stranding. Vascular/Lymphatic: Atherosclerotic abdominal aorta with ectatic 2.6 cm infrarenal  abdominal aorta. No pathologically enlarged lymph nodes in the abdomen or pelvis. Reproductive: Status post hysterectomy, with no abnormal findings at the vaginal cuff. No adnexal mass. Other: No pneumoperitoneum, ascites or focal fluid  collection. Musculoskeletal: No aggressive appearing focal osseous lesions. Severe degenerative disc disease throughout the lumbar spine with moderate lumbar dextroscoliosis. IMPRESSION: 1. Severe left hydronephrosis with prominently dilated extrarenal left renal pelvis, worsened since 2015 CT, with abrupt caliber transition at the left UPJ suggesting worsened chronic left UPJ stenosis. New urothelial wall thickening throughout the left renal pelvis with surrounding fat haziness, nonspecific, suspicious for left upper tract infection. New mild layering hyperdense material with punctate stones in the left renal pelvis and left renal collecting system. No left ureteral stones. 2. Absent right kidney. 3. Mild diffuse bladder wall thickening, nonspecific, cannot exclude acute cystitis. Suggest correlation with urinalysis. 4. Right inguinal hernia contains a right pelvic small bowel loop, with no evidence of acute bowel complication. 5. 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 Recommend follow-up ultrasound every 5 years. This recommendation follows ACR consensus guidelines: White Paper of the ACR Incidental Findings Committee II on Vascular Findings. J Am Coll Radiol 2013; 10:789-794. 6. Coronary atherosclerosis. 7. Aortic Atherosclerosis (ICD10-I70.0). Electronically Signed   By: Ilona Sorrel M.D.   On: 09/27/2020 11:50     Assessment and Plan:   1.  Paroxysmal atrial fibrillation -Rate controlled -EKG in February 2020 did not show A. fib -CHA2DS2-VASc of 6 (age, cva, htn, gender) -Due to fall risk, will recommend against anticoagulating patient.  This was discussed in detail with patient and sister. -Continue Lopressor 25 mg twice daily -Echo  2.  hx of  hypertension -BP controlled -Lopressor  3.  Pyelonephritis -On antibiotics as per primary team  Total encounter time 110 minutes  Greater than 50% was spent in counseling and coordination of care with the patient -Pending echo, no additional cardiac testing planned.   Signed, Kate Sable, MD  09/27/2020 5:18 PM

## 2020-09-27 NOTE — ED Notes (Signed)
Pt consumed some diet tray at this time. States that she does not have much of an appetite at this time.

## 2020-09-27 NOTE — ED Notes (Signed)
Advised nurse that patient has assigned bed 

## 2020-09-27 NOTE — ED Provider Notes (Signed)
[facility] Emergency Department Provider Note  ____________________________________________   First MD Initiated Contact with Patient 09/27/20 0902     (approximate)  I have reviewed the triage vital signs and the nursing notes.   HISTORY  Chief Complaint Back Pain    HPI Cathy Solis is a 79 y.o. female  With h/o single kidney, CKD, chronic UPJ obstruction here with flank pain, nausea, weakness. Pt states that her sx started 4-5 days ago with mild lower back pain and decreased urine output. She has since developed worsening fatigue, nausea, and chills. She has been having difficulty keeping any food or water down. She has now developed urinary frequency and weakness, was unable to actually walk today. No vomiting today. No alleviating factors. 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       Past Medical History:  Diagnosis Date  . Breast cancer [facility]) 2006   Left breast, s/p radiation  . 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 Hypertension   . Kidney problem    Undeveloped R kidney  . 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 Personal history of tobacco use, presenting hazards to health 03/18/2016  . Stroke [facility])    residual left sided weakness  . Toe infection    followed by Dr. Jens Som    Patient Active Problem List   Diagnosis Date Noted  . Moderate protein-calorie malnutrition ([place]) 09/03/2020  . 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 Chronic kidney disease (CKD), stage III (moderate) 08/20/2019  . RBBB 05/09/2019  . Pressure injury of skin 01/15/2019  . COPD (chronic obstructive pulmonary disease) ([place]) 01/14/2019  . Osteoporosis 11/02/2017  . Coronary atherosclerosis 05/23/2016  . Personal history of tobacco use, presenting hazards to health 03/18/2016  . Idiopathic scoliosis 11/28/2014  . Tobacco abuse 11/28/2014  . Intrinsic ureteral obstruction  02/04/2014  . Hydronephrosis 02/04/2014  . 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 Carotid stenosis 05/23/2013  . Pernicious anemia 06/26/2012  . Hyperlipidemia 12/29/2011  . 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 Hypothyroidism 08/04/2011    Past Surgical History:  Procedure Laterality Date  . ABDOMINAL HYSTERECTOMY    . BLADDER REPAIR    . BREAST LUMPECTOMY Left   . BREAST SURGERY     left  . CAROTID ENDARTERECTOMY  2008   left  . COLONOSCOPY  2008  . EYE SURGERY Right 2013   cataract  . KIDNEY SURGERY  1949    Prior to Admission medications   Medication Sig Start Date End Date Taking? Authorizing Provider  albuterol (VENTOLIN HFA) 108 (90 Base) MCG/ACT inhaler Inhale 2 puffs into the lungs every 6 (six) hours as needed for wheezing or shortness of breath. 07/21/20  Yes Johnson, Megan P, DO  amLODipine (NORVASC) 10 MG tablet Take 1 tablet (10 mg total) by mouth daily. 07/21/20  Yes Johnson, Megan P, DO  aspirin EC 81 MG tablet Take 81 mg by mouth daily. Swallow whole.   Yes [provider]  ipratropium-albuterol (DUONEB) 0.5-2.5 (3) MG/3ML SOLN Take 3 mLs by nebulization 2 (two) times daily for 4 days. DuoNeb nebulizer treatments twice a day for 4 days followed by as needed for severe shortness of breath Patient taking differently: Take 3 mLs by nebulization 2 (two) times daily.  01/27/20 09/27/20 Yes Johnson, Megan P, DO  levothyroxine (SYNTHROID) 150 MCG tablet Take 1 tablet (  150 mcg total) by mouth daily. 09/06/20  Yes Johnson, Megan P, DO  lisinopril (ZESTRIL) 10 MG tablet Take 1 tablet (10 mg total) by mouth daily. TAKE 1 TABLET BY MOUTH EVERY DAY 07/21/20  Yes Johnson, Megan P, DO  metoprolol tartrate (LOPRESSOR) 25 MG tablet Take 1 tablet (25 mg total) by mouth 2 (two) times daily. 07/21/20  Yes Johnson, Megan P, DO    Allergies Statins and Zetia [ezetimibe]  Family History  Problem Relation Age of Onset  . Stroke  Mother   . Heart disease Father   . Heart attack Maternal Grandfather     Social History Social History   Tobacco Use  . Smoking status: Current Some Day Smoker    Packs/day: 0.50    Years: 30.00    Pack years: 15.00    Types: Cigarettes    Last attempt to quit: 01/08/2019    Years since quitting: 1.7  . Smokeless tobacco: Never Used  Vaping Use  . Vaping Use: Never used  Substance Use Topics  . Alcohol use: No  . Drug use: No    Review of Systems  Review of Systems  Constitutional: Positive for fatigue. Negative for fever.  HENT: Negative for congestion and sore throat.   Eyes: Negative for visual disturbance.  Respiratory: Negative for cough and shortness of breath.   Cardiovascular: Negative for chest pain.  Gastrointestinal: Positive for nausea and vomiting. Negative for abdominal pain and diarrhea.  Genitourinary: Positive for flank pain.  Musculoskeletal: Negative for back pain and neck pain.  Skin: Negative for rash and wound.  Neurological: Positive for weakness.  All other systems reviewed and are negative.    ____________________________________________  PHYSICAL EXAM:      VITAL SIGNS: ED Triage Vitals  Enc Vitals Group     BP 09/27/20 0852 (!) 147/73     Pulse Rate 09/27/20 0852 80     Resp 09/27/20 0852 (!) 25     Temp 09/27/20 0852 99.1 F (37.3 C)     Temp Source 09/27/20 0852 Oral     SpO2 09/27/20 0852 92 %     Weight 09/27/20 0848 130 lb (59 kg)     Height 09/27/20 0848 5' (1.524 m)     Head Circumference --      Peak Flow --      Pain Score 09/27/20 0848 9     Pain Loc --      Pain Edu? --      Excl. in [place]? --      Physical Exam Vitals and nursing note reviewed.  Constitutional:      General: She is not in acute distress.    Appearance: She is well-developed.  HENT:     Head: Normocephalic and atraumatic.     Mouth/Throat:     Mouth: Mucous membranes are dry.  Eyes:     Conjunctiva/sclera: Conjunctivae normal.   Cardiovascular:     Rate and Rhythm: Normal rate and regular rhythm.     Heart sounds: Normal heart sounds. No murmur heard.  No friction rub.  Pulmonary:     Effort: Pulmonary effort is normal. No respiratory distress.     Breath sounds: Normal breath sounds. No wheezing or rales.  Abdominal:     General: There is no distension.     Palpations: Abdomen is soft.     Tenderness: There is abdominal tenderness.     Comments: Mild L CVAT. No rebound or guarding.  Musculoskeletal:  Cervical back: Neck supple.  Skin:    General: Skin is warm.     Capillary Refill: Capillary refill takes less than 2 seconds.  Neurological:     Mental Status: She is alert and oriented to person, place, and time.     Motor: No abnormal muscle tone.       ____________________________________________   LABS (all labs ordered are listed, but only abnormal results are displayed)  Labs Reviewed  URINALYSIS, COMPLETE (UACMP) WITH MICROSCOPIC - Abnormal; Notable for the following components:      Result Value   Color, Urine AMBER (*)    APPearance CLOUDY (*)    pH 9.0 (*)    Hgb urine dipstick SMALL (*)    Ketones, ur 5 (*)    Protein, ur >=300 (*)    Nitrite POSITIVE (*)    Leukocytes,Ua MODERATE (*)    RBC / HPF >50 (*)    WBC, UA >50 (*)    Bacteria, UA MANY (*)    All other components within normal limits  BASIC METABOLIC PANEL - Abnormal; Notable for the following components:   Sodium 134 (*)    CO2 19 (*)    Glucose, Bld 139 (*)    BUN 82 (*)    Creatinine, Ser 2.12 (*)    GFR, Estimated 23 (*)    All other components within normal limits  CBC - Abnormal; Notable for the following components:   WBC 16.4 (*)    All other components within normal limits  URINE CULTURE  CULTURE, BLOOD (ROUTINE X 2)  CULTURE, BLOOD (ROUTINE X 2)  RESPIRATORY PANEL BY RT PCR (FLU A&B, COVID)  LACTIC ACID, PLASMA  LACTIC ACID, PLASMA    ____________________________________________  EKG: Atrial  fibrillation vs sinus arrhythmia, ventricular 94.  QRS 117, QTc 498.  No acute ST elevations or depressions. ________________________________________  RADIOLOGY All imaging, including plain films, CT scans, and ultrasounds, independently reviewed by me, and interpretations confirmed via formal radiology reads.  ED MD interpretation:   CT stone: Severe left hydronephrosis and UPJ obstruction, renal stones, no ureteral stents  Official radiology report(s): CT Renal Stone Study  Result Date: 09/27/2020 CLINICAL DATA:  Back pain for 3 days. Stage 3 chronic kidney disease. Foul-smelling urine. Remote history of unspecified kidney surgery. EXAM: CT ABDOMEN AND PELVIS WITHOUT CONTRAST TECHNIQUE: Multidetector CT imaging of the abdomen and pelvis was performed following the standard protocol without IV contrast. COMPARISON:  01/03/2014 CT abdomen/pelvis. FINDINGS: Lower chest: Mild curvilinear scarring versus atelectasis at the dependent right lung base. Coronary atherosclerosis. Hepatobiliary: Normal liver size. No liver mass. Normal gallbladder with no radiopaque cholelithiasis. No biliary ductal dilatation. Pancreas: Normal, with no mass or duct dilation. Spleen: Normal size. No mass. Adrenals/Urinary Tract: No discrete adrenal nodules. Absent right kidney. Severe left hydronephrosis with prominently dilated extrarenal left renal pelvis, worsened since 2015 CT. New urothelial wall thickening throughout the left renal pelvis with surrounding fat haziness. New mild layering hyperdense material with punctate stones in the left renal pelvis and left renal collecting system. No contour deforming left renal masses. Abrupt caliber transition at the left ureteropelvic junction. No left ureteral stones. Mild diffuse bladder wall thickening. No bladder stones. Stomach/Bowel: Normal non-distended stomach. Right inguinal hernia contains a right pelvic small bowel loop. No small bowel dilatation, wall thickening or  pneumatosis. Appendix not discretely visualized. Normal large bowel with no diverticulosis, large bowel wall thickening or pericolonic fat stranding. Vascular/Lymphatic: Atherosclerotic abdominal aorta with ectatic 2.6 cm infrarenal  abdominal aorta. No pathologically enlarged lymph nodes in the abdomen or pelvis. Reproductive: Status post hysterectomy, with no abnormal findings at the vaginal cuff. No adnexal mass. Other: No pneumoperitoneum, ascites or focal fluid collection. Musculoskeletal: No aggressive appearing focal osseous lesions. Severe degenerative disc disease throughout the lumbar spine with moderate lumbar dextroscoliosis. IMPRESSION: 1. Severe left hydronephrosis with prominently dilated extrarenal left renal pelvis, worsened since 2015 CT, with abrupt caliber transition at the left UPJ suggesting worsened chronic left UPJ stenosis. New urothelial wall thickening throughout the left renal pelvis with surrounding fat haziness, nonspecific, suspicious for left upper tract infection. New mild layering hyperdense material with punctate stones in the left renal pelvis and left renal collecting system. No left ureteral stones. 2. Absent right kidney. 3. Mild diffuse bladder wall thickening, nonspecific, cannot exclude acute cystitis. Suggest correlation with urinalysis. 4. Right inguinal hernia contains a right pelvic small bowel loop, with no evidence of acute bowel complication. 5. 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 Recommend follow-up ultrasound every 5 years. This recommendation follows ACR consensus guidelines: White Paper of the ACR Incidental Findings Committee II on Vascular Findings. J Am Coll Radiol 2013; 10:789-794. 6. Coronary atherosclerosis. 7. Aortic Atherosclerosis (ICD10-I70.0). Electronically Signed   By: Ilona Sorrel M.D.   On: 09/27/2020 11:50    ____________________________________________  PROCEDURES   Procedure(s) performed (including Critical Care):  .Critical  Care Performed by: Duffy Bruce, MD Authorized by: Duffy Bruce, MD   Critical care provider statement:    Critical care time (minutes):  35   Critical care time was exclusive of:  Separately billable procedures and treating other patients and teaching time   Critical care was necessary to treat or prevent imminent or life-threatening deterioration of the following conditions:  Cardiac failure, circulatory failure, respiratory failure and sepsis   Critical care was time spent personally by me on the following activities:  Development of treatment plan with patient or surrogate, discussions with consultants, evaluation of patient's response to treatment, examination of patient, obtaining history from patient or surrogate, ordering and performing treatments and interventions, ordering and review of laboratory studies, ordering and review of radiographic studies, pulse oximetry, re-evaluation of patient's condition and review of old charts   I assumed direction of critical care for this patient from another provider in my specialty: no      ____________________________________________  INITIAL IMPRESSION / MDM / [place] / ED COURSE  As part of my medical decision making, I reviewed the following data within the [place] notes reviewed and incorporated, Old chart reviewed, Notes from prior ED visits, and [place] [employer] [place] was evaluated in Emergency Department on 09/27/2020 for the symptoms described in the history of present illness. She was evaluated in the context of the global COVID-19 pandemic, which necessitated consideration that the patient might be at risk for infection with the SARS-CoV-2 virus that causes COVID-19. Institutional protocols and algorithms that pertain to the evaluation of patients at risk for COVID-19 are in a state of rapid change based on information released by regulatory bodies including  the CDC and federal and state organizations. These policies and algorithms were followed during the patient's care in the ED.  Some ED evaluations and interventions may be delayed as a result of limited staffing during the pandemic.*     Medical Decision Making:  79 yo F here with pyelonephritis, AKI. Pt has a single, unilateral kidney with UPJ  obstruction, with severe hydro on stone study and diffuswe stranding. Labs show significant leukocytosis and AoCKD. UA is c/w UTI with many bacteria, pos nitrites, and ketones c/w dehydration. BCx, UCx sent and pt started on IV Rocephin. IVF given. Will admit to medicine. LA normal, no signs of severe sepsis. Discussed with Dr. Diamantina [facility] of Urology, who will review imaging. Likely medical management at this time. Pt maintained on tele which shows frequent pACs. EKG possibly with fib though presence of p wave suggests SR with pACs.  ____________________________________________  FINAL CLINICAL IMPRESSION(S) / ED DIAGNOSES  Final diagnoses:  Sepsis secondary to UTI ([place])  UPJ (ureteropelvic junction) obstruction  Pyelonephritis     MEDICATIONS GIVEN DURING THIS VISIT:  Medications  0.9 %  sodium chloride infusion (has no administration in time range)  sodium chloride 0.9 % bolus 1,000 mL (0 mLs Intravenous Stopped 09/27/20 1125)  acetaminophen (TYLENOL) tablet 650 mg (650 mg Oral Given 09/27/20 1124)  fentaNYL (SUBLIMAZE) injection 25 mcg (25 mcg Intravenous Given 09/27/20 1124)  ondansetron (ZOFRAN) injection 4 mg (4 mg Intravenous Given 09/27/20 1125)  cefTRIAXone (ROCEPHIN) 2 g in sodium chloride 0.9 % 100 mL IVPB (2 g Intravenous New Bag/Given 09/27/20 1124)     ED Discharge Orders    None       Note:  This document was prepared using Dragon voice recognition software and may include unintentional dictation errors.   Duffy Bruce, MD 09/27/20 1329

## 2020-09-27 NOTE — ED Triage Notes (Addendum)
Pt arrives via EMS from home after having back pain for the past 3 days- pt has been sitting in her recliner and has not been getting up much for the last 3 days- pt has stage 3 kidney disease and foul smelling urine- pt has only one kidney

## 2020-09-27 NOTE — ED Notes (Signed)
Pt taken for CT 

## 2020-09-27 NOTE — ED Notes (Signed)
Pt declined to eat any supper tray at this time. States "Peas and rice are not my idea of a meal; plus I don't have my teeth". Encouraged pt to call family to bring in dentures.

## 2020-09-27 NOTE — H&P (Addendum)
History and Physical   Cathy Solis XLK:[id number] DOB: [birth year] DOA: 09/27/2020  PCP: Valerie Roys, DO  Outpatient Specialists: [facility], Orson Gear, NP Patient coming from: Home  I have personally briefly reviewed patient's old medical records in [place].  Chief Concern: Weakness and back pain  HPI: Cathy Solis is a 79 y.o. female with medical history significant for hypothyroid, hypertension, chronic tobacco use, current tobacco use, COPD, solitary left kidney with chronic hydronephrosis (right nephrectomy in 2008/2009 and minimal information available in Care Everywhere), paroxysmal A. fib, chronic bilateral carotid stenosis with 1 to 39% stenosis via ultrasound imaging on 11/28/2019, presents to the emergency department for chief concerns of weakness and back pain that started approximately 2 to 3 days ago.  She states that the back pain started Thursday or Friday.  She reports that the back pain is a 5/10 at its peak now reports no pain after pain medications given by ED provider.  She states that the weakness worsened yesterday, 09/26/2020 to the degree that she was having difficulty getting out of her chair.  She denies overt changes in p.o. intake   She further denies fever, headache, vision changes, nausea, vomiting, chest pain, unchanged baseline shortness of breath, dysphagia, abdominal pain, ringing in ears, heart palpitations, diarrhea, constipation, blood in her stool.  She endorsed a 13 lb unintentional weight loss in two months. Hair loss in the last 2-3 months and it has come back now.  Social hx: Retired, widowed, lives by herself, sister lives about 1.5 miles away. Endorses current tobacco smoking, denies alcohol use, denies recreational drug use  Healthcare power of attorney: Averill Winters, [phone]; per patient sister, Mr. Paschen lives in [place].  ED Course: Discussed with ED provider.  Requesting admission for  pyelonephritis and acute kidney injury.  Urology has been consulted.  CT renal showed severe left hydronephrosis with prominently dilated extrarenal left renal pelvis, worsens compared to 2015.  New urothelial wall thickening throughout the left renal pelvis with surrounding fat haziness, suspicious for left upper tract infection.  New mild layering hyperdense material with punctate stones in the left renal pelvis and left renal collecting system.  No left ureteral stones.  She received IV ceftriaxone 2 g, fentanyl 25 mcg, Zofran 4 mg, 1 L normal saline bolus in the ED.  Review of Systems: As per HPI otherwise 10 point review of systems negative.   Assessment/Plan  Principal Problem:   Acute pyelonephritis Active Problems:   Carotid stenosis   Tobacco abuse   COPD (chronic obstructive pulmonary disease) (HCC)   RBBB   Chronic kidney disease (CKD), stage III (moderate) (HCC)   UTI (urinary tract infection)   Pyelonephritis-etiology work-up is in progress -Presenting symptoms: Left lower back pain with weakness x2-3 days -UA had elevated squamous epithelial, trace leukocytes, and positive for nitrates -Urine culture pending -CT renal stone study showed severe left hydronephrosis with prominently dilated extrarenal left renal pelvis, worsened since 2015 CT, abrupt caliber transition at the left UPJ suggesting worsening chronic left UPJ stenosis.  New urothelial wall thickening throughout the left renal pelvis with surrounding fat haziness, suspicious for left upper tract infection.  New mild layering hyperdense material with punctate stones in the left renal pelvis and left renal collecting system.  No left ureteral stones.  Mild diffuse bladder wall thickening, cannot exclude acute cystitis. -CT evidence of absent right kidney, right inguinal hernia contains some pelvic small bowel loop, no evidence of acute bowel complication.  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  Follow-up ultrasound  every 5 years recommended per ACR consensus guidelines. -Status post fentanyl 25 mcg, ondansetron IV 4 mg once, acetaminophen 650 mg p.o., ceftriaxone 2 g IV, normal saline 1 L bolus -Continue ceftriaxone at 1 g IV for treatment of pyelonephritis, normal saline at 125 cc/h,  -Urine culture has been collected and pending -ED provider has consulted urology, and urology recommends admission, antibiotics, IVF, and they will see patient tomorrow morning -Per urology discussion, if patient's deteriorates clinically and/or becomes anuretic, intervention will be considered including attempted stent versus nephrostomy tube -Urology will see patient  Acute kidney injury on chronic kidney disease-etiology unclear at this time, differential includes infrarenal, prerenal, with post renal contribution and likely multifactorial including pyelonephritis, poor p.o. intake, and severe left hydronephrosis -Baseline GFR is 30-38, creatinine 1.33-1.47 -Avoid nephrotoxic agents, including holding lisinopril at this time and avoid NSAIDs for pain control -Status post 1 L normal saline bolus in the ED -Started normal saline IVF at 125 cc/h -Encourage p.o. intake, renal and cardiac diet no fluid restriction at this time -BMP in the a.m. -If no improvement will consider renal ultrasound  Metabolic acidosis-suspect secondary to acute kidney injury, continue IVF, continue to monitor with BMP in the a.m.  Hypertension-controlled, holding home lisinopril 10 mg daily -Resumed amlodipine 10 mg daily, metoprolol tartrate 25 mg twice daily -Hydralazine 25 mg every 8 hours as needed for SBP greater than 150  Hypothyroid-resumed home 150 mcg -Patient states that in the last 2 to 3 months she has been experiencing chronic hair loss, checking thyroid panel  COPD-does not appear to be in exacerbation at this time -Currently on room air, as needed oxygen supplementation ordered -Aspiration precautions -Albuterol as needed has  been resumed -Duo nebs as needed is available  Atrial fibrillation-appears paroxysmal in setting of pyelonephritis - Echo has been ordered, continue telemetry -Reviewed EKG on 01/14/2019 which showed A. fib and right bundle branch block at the time -No prior cardiology evaluation or outpatient follow-up regarding her paroxysmal A. fib -We utilize a CHA2DS2 - VASc risk score to stratify the risk of embolic events in A fib patient. Congestive heart failure (or Left ventricular systolic dysfunction) Hypertension: blood pressure consistently above 140/90 mmHg (or treated hypertension on  medication) Age ?75 years Diabetes Mellitus Prior Stroke or TIA or thromboembolism  Vascular disease (e.g. peripheral artery disease, myocardial infarction, aortic plaque) Age 93-74 years Sex category (i.e. female sex)  Total score equals: 5 J Am Coll Cardiol. 2011 Mar 15; 57(11):e101-98 -Cardiology has been consulted  Chronic bilateral carotid stenosis-continue to monitor outpatient Debility-present on admission, fall precautions Unintentional weight loss-follow-up with primary care provider for further work-up  DVT prophylaxis: Heparin Code Status: Full code Diet: Heart healthy/renal, no fluid restriction at this time; n.p.o. in the a.m. pending urology evaluation Family Communication: Discussed with sister, [name], at bedside Disposition Plan: Pending clinical course Consults called: urology   Admission status: Observation med-surg, with telemetry  Past Medical History:  Diagnosis Date  . Breast cancer [facility]) 2006   Left breast, s/p radiation  . Cancer [facility]) 2006   Nose  . Hypertension   . Kidney problem    Undeveloped R kidney  . 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 Personal history of tobacco use, presenting hazards to health 03/18/2016  . Stroke [facility])    residual left sided weakness  . Toe infection    followed by Dr. Jens Som  Past  Surgical History:  Procedure Laterality Date  . ABDOMINAL HYSTERECTOMY    . BLADDER REPAIR    . BREAST LUMPECTOMY Left   . BREAST SURGERY     left  . CAROTID ENDARTERECTOMY  2008   left  . COLONOSCOPY  2008  . EYE SURGERY Right 2013   cataract  . KIDNEY SURGERY  1949   Social History:  reports that she has been smoking cigarettes. She has a 15.00 pack-year smoking history. She has never used smokeless tobacco. She reports that she does not drink alcohol and does not use drugs.  Allergies  Allergen Reactions  . Statins Other (See Comments)    Myalgias  . Zetia [Ezetimibe] Other (See Comments)    myalgias   Family History  Problem Relation Age of Onset  . Stroke Mother   . Heart disease Father   . Heart attack Maternal Grandfather    Family history: Family history reviewed. She had a younger brother that was paralyzed from waist down after being born and died of renal failure.  Prior to Admission medications   Medication Sig Start Date End Date Taking? Authorizing Provider  albuterol (VENTOLIN HFA) 108 (90 Base) MCG/ACT inhaler Inhale 2 puffs into the lungs every 6 (six) hours as needed for wheezing or shortness of breath. 07/21/20  Yes Johnson, Megan P, DO  amLODipine (NORVASC) 10 MG tablet Take 1 tablet (10 mg total) by mouth daily. 07/21/20  Yes Johnson, Megan P, DO  aspirin EC 81 MG tablet Take 81 mg by mouth daily. Swallow whole.   Yes [provider]  ipratropium-albuterol (DUONEB) 0.5-2.5 (3) MG/3ML SOLN Take 3 mLs by nebulization 2 (two) times daily for 4 days. DuoNeb nebulizer treatments twice a day for 4 days followed by as needed for severe shortness of breath Patient taking differently: Take 3 mLs by nebulization 2 (two) times daily.  01/27/20 09/27/20 Yes Johnson, Megan P, DO  levothyroxine (SYNTHROID) 150 MCG tablet Take 1 tablet (150 mcg total) by mouth daily. 09/06/20  Yes Johnson, Megan P, DO  lisinopril (ZESTRIL) 10 MG tablet Take 1 tablet (10 mg total) by  mouth daily. TAKE 1 TABLET BY MOUTH EVERY DAY 07/21/20  Yes Johnson, Megan P, DO  metoprolol tartrate (LOPRESSOR) 25 MG tablet Take 1 tablet (25 mg total) by mouth 2 (two) times daily. 07/21/20  Yes Park Liter P, DO   Physical Exam: Vitals:   09/27/20 1200 09/27/20 1230 09/27/20 1300 09/27/20 1330  BP: 109/62 127/76 118/63 128/67  Pulse:   82 78  Resp: 12 16 (!) 21 17  Temp:      TempSrc:      SpO2:   90% 90%  Weight:      Height:       Constitutional: appears frail and cachectic with bilateral temporal wasting, NAD, calm, comfortable Eyes: PERRL, lids and conjunctivae normal ENMT: Mucous membranes are moist. Posterior pharynx clear of any exudate or lesions. Age-appropriate dentition with multiple teeth loss. Hearing loss Neck: normal, supple, no masses, no thyromegaly Respiratory: clear to auscultation bilaterally, no wheezing, no crackles. Normal respiratory effort. No accessory muscle use.  Cardiovascular: Regular rate and rhythm, no murmurs / rubs / gallops. No extremity edema. 2+ pedal pulses. No carotid bruits.  Abdomen: no tenderness, no masses palpated, no hepatosplenomegaly. Bowel sounds positive.  Musculoskeletal: Left CVA tenderness, no clubbing / cyanosis. No joint deformity upper and lower extremities. Good ROM, no contractures. Bilateral lower extremity muscle atrophy consistent with decreased use chronically.  Bilateral lower extremity muscle tone weakness Skin: no rashes, lesions, ulcers. No induration Neurologic: Sensation intact. Strength 4/5 in all 4.  Psychiatric: Normal judgment and insight. Alert and oriented x 3, able to tell me her full name shortness of breath, states she is turning 79 in January, current year is 11, and current president is [place]. Normal mood.   EKG: Independently reviewed, showing atrial fibrillation with rate of 94, incomplete right bundle branch block.  Atrial fibrillation and right bundle branch block has been present on previous EKG,  specifically 01/14/2019.  CT Renal Stone Study on Admission: Personally reviewed and I agree with radiologist reading as below.  CT Renal Stone Study  Result Date: 09/27/2020 CLINICAL DATA:  Back pain for 3 days. Stage 3 chronic kidney disease. Foul-smelling urine. Remote history of unspecified kidney surgery. EXAM: CT ABDOMEN AND PELVIS WITHOUT CONTRAST TECHNIQUE: Multidetector CT imaging of the abdomen and pelvis was performed following the standard protocol without IV contrast. COMPARISON:  01/03/2014 CT abdomen/pelvis. FINDINGS: Lower chest: Mild curvilinear scarring versus atelectasis at the dependent right lung base. Coronary atherosclerosis. Hepatobiliary: Normal liver size. No liver mass. Normal gallbladder with no radiopaque cholelithiasis. No biliary ductal dilatation. Pancreas: Normal, with no mass or duct dilation. Spleen: Normal size. No mass. Adrenals/Urinary Tract: No discrete adrenal nodules. Absent right kidney. Severe left hydronephrosis with prominently dilated extrarenal left renal pelvis, worsened since 2015 CT. New urothelial wall thickening throughout the left renal pelvis with surrounding fat haziness. New mild layering hyperdense material with punctate stones in the left renal pelvis and left renal collecting system. No contour deforming left renal masses. Abrupt caliber transition at the left ureteropelvic junction. No left ureteral stones. Mild diffuse bladder wall thickening. No bladder stones. Stomach/Bowel: Normal non-distended stomach. Right inguinal hernia contains a right pelvic small bowel loop. No small bowel dilatation, wall thickening or pneumatosis. Appendix not discretely visualized. Normal large bowel with no diverticulosis, large bowel wall thickening or pericolonic fat stranding. Vascular/Lymphatic: Atherosclerotic abdominal aorta with ectatic 2.6 cm infrarenal abdominal aorta. No pathologically enlarged lymph nodes in the abdomen or pelvis. Reproductive: Status post  hysterectomy, with no abnormal findings at the vaginal cuff. No adnexal mass. Other: No pneumoperitoneum, ascites or focal fluid collection. Musculoskeletal: No aggressive appearing focal osseous lesions. Severe degenerative disc disease throughout the lumbar spine with moderate lumbar dextroscoliosis. IMPRESSION: 1. Severe left hydronephrosis with prominently dilated extrarenal left renal pelvis, worsened since 2015 CT, with abrupt caliber transition at the left UPJ suggesting worsened chronic left UPJ stenosis. New urothelial wall thickening throughout the left renal pelvis with surrounding fat haziness, nonspecific, suspicious for left upper tract infection. New mild layering hyperdense material with punctate stones in the left renal pelvis and left renal collecting system. No left ureteral stones. 2. Absent right kidney. 3. Mild diffuse bladder wall thickening, nonspecific, cannot exclude acute cystitis. Suggest correlation with urinalysis. 4. Right inguinal hernia contains a right pelvic small bowel loop, with no evidence of acute bowel complication. 5. 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 Recommend follow-up ultrasound every 5 years. This recommendation follows ACR consensus guidelines: White Paper of the ACR Incidental Findings Committee II on Vascular Findings. J Am Coll Radiol 2013; 10:789-794. 6. Coronary atherosclerosis. 7. Aortic Atherosclerosis (ICD10-I70.0). Electronically Signed   By: Ilona Sorrel M.D.   On: 09/27/2020 11:50   Labs on Admission: I have personally reviewed following labs  CBC: Recent Labs  Lab 09/27/20 0856  WBC 16.4*  HGB 13.0  HCT 38.9  MCV 91.5  PLT 789   Basic Metabolic Panel: Recent Labs  Lab 09/27/20 0856  NA 134*  K 4.7  CL 100  CO2 19*  GLUCOSE 139*  BUN 82*  CREATININE 2.12*  CALCIUM 9.4   GFR: Estimated Creatinine Clearance: 16.7 mL/min (A) (by C-G formula based on SCr of 2.12 mg/dL (H)).  Urine analysis:    Component Value Date/Time    COLORURINE AMBER (A) 09/27/2020 1037   APPEARANCEUR CLOUDY (A) 09/27/2020 1037   APPEARANCEUR Cloudy (A) 01/04/2019 0928   LABSPEC 1.013 09/27/2020 1037   LABSPEC 1.010 01/03/2014 1104   PHURINE 9.0 (H) 09/27/2020 1037   GLUCOSEU NEGATIVE 09/27/2020 1037   GLUCOSEU Negative 01/03/2014 1104   HGBUR SMALL (A) 09/27/2020 1037   BILIRUBINUR NEGATIVE 09/27/2020 1037   BILIRUBINUR Negative 01/04/2019 0928   BILIRUBINUR Negative 01/03/2014 1104   KETONESUR 5 (A) 09/27/2020 1037   PROTEINUR >=300 (A) 09/27/2020 1037   UROBILINOGEN 0.2 02/14/2013 1345   NITRITE POSITIVE (A) 09/27/2020 1037   LEUKOCYTESUR MODERATE (A) 09/27/2020 1037   LEUKOCYTESUR Negative 01/03/2014 1104   Refael Fulop N Thaer Miyoshi D.O. [facility]  If 12AM-7AM, please contact overnight-coverage provider If 7AM-7PM, please contact day coverage provider [email or web address]  09/27/2020, 2:27 PM

## 2020-09-28 ENCOUNTER — Inpatient Hospital Stay: Payer: Medicare HMO

## 2020-09-28 ENCOUNTER — Ambulatory Visit: Payer: Medicare HMO | Admitting: Licensed Clinical Social Worker

## 2020-09-28 ENCOUNTER — Inpatient Hospital Stay: Admit: 2020-09-28 | Payer: Medicare HMO

## 2020-09-28 DIAGNOSIS — E782 Mixed hyperlipidemia: Secondary | ICD-10-CM

## 2020-09-28 DIAGNOSIS — I480 Paroxysmal atrial fibrillation: Secondary | ICD-10-CM | POA: Diagnosis not present

## 2020-09-28 DIAGNOSIS — Z79890 Hormone replacement therapy (postmenopausal): Secondary | ICD-10-CM | POA: Diagnosis not present

## 2020-09-28 DIAGNOSIS — J9611 Chronic respiratory failure with hypoxia: Secondary | ICD-10-CM | POA: Diagnosis not present

## 2020-09-28 DIAGNOSIS — Z923 Personal history of irradiation: Secondary | ICD-10-CM | POA: Diagnosis not present

## 2020-09-28 DIAGNOSIS — K922 Gastrointestinal hemorrhage, unspecified: Secondary | ICD-10-CM | POA: Diagnosis not present

## 2020-09-28 DIAGNOSIS — I482 Chronic atrial fibrillation, unspecified: Secondary | ICD-10-CM | POA: Diagnosis not present

## 2020-09-28 DIAGNOSIS — F17210 Nicotine dependence, cigarettes, uncomplicated: Secondary | ICD-10-CM | POA: Diagnosis present

## 2020-09-28 DIAGNOSIS — N179 Acute kidney failure, unspecified: Secondary | ICD-10-CM | POA: Diagnosis not present

## 2020-09-28 DIAGNOSIS — Z9981 Dependence on supplemental oxygen: Secondary | ICD-10-CM | POA: Diagnosis not present

## 2020-09-28 DIAGNOSIS — N1832 Chronic kidney disease, stage 3b: Secondary | ICD-10-CM

## 2020-09-28 DIAGNOSIS — M810 Age-related osteoporosis without current pathological fracture: Secondary | ICD-10-CM | POA: Diagnosis present

## 2020-09-28 DIAGNOSIS — I5031 Acute diastolic (congestive) heart failure: Secondary | ICD-10-CM | POA: Diagnosis not present

## 2020-09-28 DIAGNOSIS — N390 Urinary tract infection, site not specified: Secondary | ICD-10-CM | POA: Diagnosis not present

## 2020-09-28 DIAGNOSIS — J439 Emphysema, unspecified: Secondary | ICD-10-CM | POA: Diagnosis not present

## 2020-09-28 DIAGNOSIS — J9621 Acute and chronic respiratory failure with hypoxia: Secondary | ICD-10-CM | POA: Diagnosis not present

## 2020-09-28 DIAGNOSIS — I10 Essential (primary) hypertension: Secondary | ICD-10-CM

## 2020-09-28 DIAGNOSIS — E039 Hypothyroidism, unspecified: Secondary | ICD-10-CM | POA: Diagnosis present

## 2020-09-28 DIAGNOSIS — Z853 Personal history of malignant neoplasm of breast: Secondary | ICD-10-CM | POA: Diagnosis not present

## 2020-09-28 DIAGNOSIS — Z79899 Other long term (current) drug therapy: Secondary | ICD-10-CM | POA: Diagnosis not present

## 2020-09-28 DIAGNOSIS — N10 Acute tubulo-interstitial nephritis: Secondary | ICD-10-CM | POA: Diagnosis not present

## 2020-09-28 DIAGNOSIS — I130 Hypertensive heart and chronic kidney disease with heart failure and stage 1 through stage 4 chronic kidney disease, or unspecified chronic kidney disease: Secondary | ICD-10-CM | POA: Diagnosis not present

## 2020-09-28 DIAGNOSIS — N12 Tubulo-interstitial nephritis, not specified as acute or chronic: Secondary | ICD-10-CM | POA: Diagnosis present

## 2020-09-28 DIAGNOSIS — Z905 Acquired absence of kidney: Secondary | ICD-10-CM | POA: Diagnosis not present

## 2020-09-28 DIAGNOSIS — E440 Moderate protein-calorie malnutrition: Secondary | ICD-10-CM | POA: Diagnosis not present

## 2020-09-28 DIAGNOSIS — M6281 Muscle weakness (generalized): Secondary | ICD-10-CM | POA: Diagnosis not present

## 2020-09-28 DIAGNOSIS — I361 Nonrheumatic tricuspid (valve) insufficiency: Secondary | ICD-10-CM | POA: Diagnosis not present

## 2020-09-28 DIAGNOSIS — R69 Illness, unspecified: Secondary | ICD-10-CM | POA: Diagnosis not present

## 2020-09-28 DIAGNOSIS — I4891 Unspecified atrial fibrillation: Secondary | ICD-10-CM | POA: Diagnosis not present

## 2020-09-28 DIAGNOSIS — Z7982 Long term (current) use of aspirin: Secondary | ICD-10-CM | POA: Diagnosis not present

## 2020-09-28 DIAGNOSIS — K920 Hematemesis: Secondary | ICD-10-CM | POA: Diagnosis not present

## 2020-09-28 DIAGNOSIS — B9620 Unspecified Escherichia coli [E. coli] as the cause of diseases classified elsewhere: Secondary | ICD-10-CM | POA: Diagnosis not present

## 2020-09-28 DIAGNOSIS — R079 Chest pain, unspecified: Secondary | ICD-10-CM | POA: Diagnosis not present

## 2020-09-28 DIAGNOSIS — N183 Chronic kidney disease, stage 3 unspecified: Secondary | ICD-10-CM

## 2020-09-28 DIAGNOSIS — J90 Pleural effusion, not elsewhere classified: Secondary | ICD-10-CM | POA: Diagnosis not present

## 2020-09-28 DIAGNOSIS — Z20822 Contact with and (suspected) exposure to covid-19: Secondary | ICD-10-CM | POA: Diagnosis not present

## 2020-09-28 DIAGNOSIS — E785 Hyperlipidemia, unspecified: Secondary | ICD-10-CM | POA: Diagnosis present

## 2020-09-28 DIAGNOSIS — J449 Chronic obstructive pulmonary disease, unspecified: Secondary | ICD-10-CM | POA: Diagnosis not present

## 2020-09-28 DIAGNOSIS — E871 Hypo-osmolality and hyponatremia: Secondary | ICD-10-CM | POA: Diagnosis not present

## 2020-09-28 DIAGNOSIS — R5381 Other malaise: Secondary | ICD-10-CM | POA: Diagnosis not present

## 2020-09-28 DIAGNOSIS — I340 Nonrheumatic mitral (valve) insufficiency: Secondary | ICD-10-CM | POA: Diagnosis not present

## 2020-09-28 DIAGNOSIS — E1122 Type 2 diabetes mellitus with diabetic chronic kidney disease: Secondary | ICD-10-CM | POA: Diagnosis present

## 2020-09-28 DIAGNOSIS — M545 Low back pain, unspecified: Secondary | ICD-10-CM | POA: Diagnosis present

## 2020-09-28 DIAGNOSIS — Z8673 Personal history of transient ischemic attack (TIA), and cerebral infarction without residual deficits: Secondary | ICD-10-CM | POA: Diagnosis not present

## 2020-09-28 DIAGNOSIS — I129 Hypertensive chronic kidney disease with stage 1 through stage 4 chronic kidney disease, or unspecified chronic kidney disease: Secondary | ICD-10-CM | POA: Diagnosis not present

## 2020-09-28 DIAGNOSIS — R279 Unspecified lack of coordination: Secondary | ICD-10-CM | POA: Diagnosis not present

## 2020-09-28 DIAGNOSIS — J441 Chronic obstructive pulmonary disease with (acute) exacerbation: Secondary | ICD-10-CM

## 2020-09-28 DIAGNOSIS — N1330 Unspecified hydronephrosis: Secondary | ICD-10-CM | POA: Diagnosis not present

## 2020-09-28 LAB — BASIC METABOLIC PANEL
Anion gap: 10 (ref 5–15)
BUN: 70 mg/dL — ABNORMAL HIGH (ref 8–23)
CO2: 17 mmol/L — ABNORMAL LOW (ref 22–32)
Calcium: 8.8 mg/dL — ABNORMAL LOW (ref 8.9–10.3)
Chloride: 110 mmol/L (ref 98–111)
Creatinine, Ser: 1.82 mg/dL — ABNORMAL HIGH (ref 0.44–1.00)
GFR, Estimated: 28 mL/min — ABNORMAL LOW (ref >60)
Glucose, Bld: 104 mg/dL — ABNORMAL HIGH (ref 70–99)
Potassium: 4.1 mmol/L (ref 3.5–5.1)
Sodium: 137 mmol/L (ref 135–145)

## 2020-09-28 LAB — CBC
HCT: 38.3 % (ref 36.0–46.0)
Hemoglobin: 12.6 g/dL (ref 12.0–15.0)
MCH: 30.8 pg (ref 26.0–34.0)
MCHC: 32.9 g/dL (ref 30.0–36.0)
MCV: 93.6 fL (ref 80.0–100.0)
Platelets: 318 10*3/uL (ref 150–400)
RBC: 4.09 MIL/uL (ref 3.87–5.11)
RDW: 13.5 % (ref 11.5–15.5)
WBC: 13.1 10*3/uL — ABNORMAL HIGH (ref 4.0–10.5)
nRBC: 0 % (ref 0.0–0.2)

## 2020-09-28 MED ORDER — SODIUM CHLORIDE 0.9 % IV SOLN: INTRAVENOUS | Status: DC

## 2020-09-28 MED ORDER — TRAMADOL HCL 50 MG PO TABS
50.0000 mg | ORAL_TABLET | Freq: Four times a day (QID) | ORAL | Status: DC | PRN
  Administered 2020-09-28 (×2): 50 mg via ORAL
  Filled 2020-09-28 (×2): qty 1

## 2020-09-28 MED ORDER — OXYCODONE HCL 5 MG PO TABS
5.0000 mg | ORAL_TABLET | ORAL | Status: DC | PRN
  Administered 2020-09-29 – 2020-10-04 (×6): 5 mg via ORAL
  Filled 2020-09-28 (×8): qty 1

## 2020-09-28 MED ORDER — METHOCARBAMOL 500 MG PO TABS
500.0000 mg | ORAL_TABLET | Freq: Once | ORAL | Status: AC
  Administered 2020-09-28: 500 mg via ORAL
  Filled 2020-09-28: qty 1

## 2020-09-28 MED ORDER — ALBUTEROL SULFATE (2.5 MG/3ML) 0.083% IN NEBU
2.5000 mg | INHALATION_SOLUTION | Freq: Four times a day (QID) | RESPIRATORY_TRACT | Status: DC | PRN
  Administered 2020-09-29: 2.5 mg via RESPIRATORY_TRACT
  Filled 2020-09-28: qty 3

## 2020-09-28 NOTE — Progress Notes (Signed)
Mobility Specialist - Progress Note   09/28/20 1300  Mobility  Activity Dangled on edge of bed  Range of Motion/Exercises Right leg;Left leg (ankle pumps, straight leg raises, hip isometrics)  Level of Assistance Moderate assist, patient does 50-74%  Assistive Device  (bed rails)  Distance Ambulated (ft) 0 ft  Mobility Response Tolerated well  Mobility performed by Mobility specialist  $Mobility charge 1 Mobility    Pre-mobility: 62 HR, 83% SpO2 During mobility: 70 HR, 82% SpO2 Post-mobility: 81 HR, 132/57 BP, 82% SpO2   Pt was lying in recliner utilizing room air with son present in room. Pt agreed to session. Pt denied any pain, nausea, or fatigue. Pt's son stated that she does experience back pain often d/t scoliosis. Pt's son also stated that pt was able to get around with [facility] prior to admission. Pt does have a RW at home that she does not use. Noted pt's O2 levels sating at low-mid 80s with poor pleth prior to activity, pt continuously denied SOB. Pt was able to get EOB with modA. Pt c/o dizziness upon sitting which did not resolve on its own, deferring OOB activity. Pt performed seated exercises: ankle pumps, straight leg raises, and hip isometrics with minA for LLE support. Overall, pt tolerated session well. Pt was modA returning to supine position, pt's son assisted with repositioning. Bed alarm was set and all needs placed in reach. Nurse was notified.    Kathee Delton Mobility Specialist 09/28/20, 1:45 PM'

## 2020-09-28 NOTE — Consult Note (Addendum)
Urology Consult   I have been asked to see the patient by Dr. 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  Chief Complaint: weakness and back pain  HPI:  Cathy Solis is a 79 y.o. 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  She was previously followed by Dr. 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  She presented yesterday with weakness and left-sided back pain.  She was afebrile with stable vital signs, CT showed severe left hydronephrosis with chronic left UPJ obstruction, labs were notable for urinalysis suspicious for infection with many bacteria, greater than 50 WBCs, greater than 50 RBCs, moderate leukocytes, nitrate positive.  Urinalysis was contaminated with 11-20 squamous cells.  Lactic acid was normal at 1.5, leukocytosis to 16 K, and AKI with creatinine of 2.12(EGFR 23).  On reviewing her records, though her baseline creatinine was around 1 when she was previously followed by [facility], it appears to have risen to around ~1.5 over the last year.  She reports her pain has improved some this morning after starting antibiotics.  She was afebrile overnight.  Leukocytosis down to 13 K and creatinine down to 1.82 this morning.  She currently has a pure wick in place and has had excellent urine overnight with 600 mL.  Her urologist at [facility] had reportedly considered ureteroscopy on the left side for further evaluation, however preop testing showed normalization of her renal function, and they opted for observation.  She denies a history of recurrent UTIs.  There are no aggravating or alleviating  factors.  Severity is moderate.  PMH: Past Medical History:  Diagnosis Date  . Breast cancer [facility]) 2006   Left breast, s/p radiation  . Cancer [facility]) 2006   Nose  . Hypertension   . Kidney problem    Undeveloped R kidney  . 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 Personal history of tobacco use, presenting hazards to health 03/18/2016  . Stroke [facility])    residual left sided weakness  . Toe infection    followed by Dr. Jens Som    Surgical History: Past Surgical History:  Procedure Laterality Date  . ABDOMINAL HYSTERECTOMY    . BLADDER REPAIR    . BREAST LUMPECTOMY Left   . BREAST SURGERY     left  . CAROTID ENDARTERECTOMY  2008   left  . COLONOSCOPY  2008  . EYE SURGERY Right 2013   cataract  . KIDNEY SURGERY  1949     Allergies:  Allergies  Allergen Reactions  . Statins Other (See Comments)    Myalgias  . Zetia [Ezetimibe] Other (See Comments)    myalgias    Family History: Family History  Problem Relation Age of Onset  . Stroke Mother   . Heart disease Father   . Heart attack Maternal Grandfather     Social History:  reports that she has been smoking cigarettes. She has a 15.00 pack-year smoking history. She has never used smokeless tobacco. She reports that she does not drink alcohol and does not use drugs.  ROS: 10 point review of  systems negative aside from those stated in the HPI.  Physical Exam: BP (!) 127/59 (BP Location: Left Arm)   Pulse 74   Temp 97.7 F (36.5 C)   Resp 20   Ht 5' (1.524 m)   Wt 59 kg   SpO2 92%   BMI 25.39 kg/m    Constitutional:  Alert, frail-appearing Cardiovascular: No clubbing, cyanosis, or edema. Respiratory: Normal respiratory effort, no increased work of breathing. GI: Abdomen is soft, nontender, nondistended, no abdominal masses GU: Mild left CVA tenderness Lymph: No cervical or inguinal lymphadenopathy. Skin: No rashes, bruises or suspicious lesions. Neurologic:  Grossly intact, no focal deficits, moving all 4 extremities. Psychiatric: Normal mood and affect.   Laboratory Data: Reviewed, see HPI  Pertinent Imaging: I have personally reviewed the prior renal ultrasound from 2017 showing moderate hydronephrosis in the left side secondary to UPJ obstruction, as well as the CT from yesterday showing severe left hydronephrosis in the setting of a suspected left UPJ obstruction.  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  Clinically she is afebrile, hemodynamically stable, WBC and creatinine downtrending with hydration and antibiotics.  We had a long conversation this morning about treatment options moving forward including observation, ureteral stent placement or nephrostomy tube, possible long-term management with chronic stent changes, or more aggressive options including balloon dilation/endopyelotomy, or even robotic pyeloplasty.  She would strongly like to pursue observation, and does not want to undergo any invasive procedures unless absolutely necessary.  She would like to follow-up with her [facility] urologist Dr. Thurmond Butts which is very reasonable.  I am happy to see her if she is unable to get plugged back into the [facility] system.  With her frailty, a minimally invasive approach with either endopyelotomy/balloon dilation, or even chronic stent exchange may be a better option for her if she has recurrent infections, ongoing left-sided flank pain, or worsening renal function.  Recommendations: Continue antibiotics and hydration, no plan for urologic intervention at this time Follow-up with her [facility] urologist Dr. Thurmond Butts to discuss alternative long-term options for chronic left UPJ obstruction  Billey Co, MD  [place] [address] [place], [place]  [phone]

## 2020-09-28 NOTE — Progress Notes (Signed)
PROGRESS NOTE    Cathy Solis  MBT:[id number] DOB: Jun 27, 1941 DOA: 09/27/2020 PCP: Valerie Roys, DO    Brief Narrative: Cathy Solis is a 79 y.o. female with medical history significant for hypothyroid, hypertension, chronic tobacco use, current tobacco use, COPD, solitary left kidney with chronic hydronephrosis (right nephrectomy in 2008/2009 and minimal information available in Care Everywhere), paroxysmal A. fib, chronic bilateral carotid stenosis with 1 to 39% stenosis via ultrasound imaging on 11/28/2019, presents to the emergency department for chief concerns of weakness and back pain that started approximately 2 to 3 days ago.  11/8: Patient reports persistent fatigue and malaise type symptoms.  No localizable pain.  Seen by urology this morning.  No intervention from their standpoint.  Patient will follow up with her outpatient urologist upon discharge.  Remains on IV antibiotics and fluids.    Assessment & Plan:   Principal Problem:   Acute pyelonephritis Active Problems:   Carotid stenosis   Tobacco abuse   Hypertension   COPD (chronic obstructive pulmonary disease) (HCC)   RBBB   Chronic kidney disease (CKD), stage III (moderate) (HCC)   UTI (urinary tract infection)   Unintentional weight loss   Debility   Pyelonephritis  Acute pyelonephritis Presents with back pain weakness x3 days UA positive for nitrates Urine culture drawn and pending Plan: Continue Rocephin 1 g every 24 hours Aggressive maintenance fluids Follow urine culture  Acute kidney injury on chronic kidney disease stage IIIa History of solitary left kidney Chronic UPJ obstruction and hydronephrosis Patient reports 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 Fib Seen by cardiology Currently rate controlled Anticoagulation deferred at this time considering the risk/benefit Plan: Continue metoprolol for rate control Telemetry monitoring Defer anticoagulation 2D echo  History of COPD Not acutely exacerbated Currently on room air As needed albuterol  Hypertension Continue metoprolol Continue amlodipine  Chronic bilateral carotid artery stenosis Aspirin  Hypothyroidism Synthroid   DVT prophylaxis: Subcutaneous heparin Code Status: Full Family Communication: None today Disposition Plan: Status is: Inpatient  Remains inpatient appropriate because:Inpatient level of care appropriate due to severity of illness   Dispo: The patient is from: Home              Anticipated d/c is to: Home              Anticipated d/c date is: 1 day              Patient currently is not medically stable to d/c.   Under treatment for acute pyonephritis.  Anticipate 24 hours additional inpatient treatment monitoring prior to disposition planning.      Consultants:   Urology  Procedures:   None  Antimicrobials:  Rocephin   Subjective: Seen and examined.  No pain complaints but does endorse general fatigue and malaise  Objective: Vitals:   09/28/20 0007 09/28/20 0406 09/28/20 0802 09/28/20 1140  BP: 139/70 (!) 127/59 (!) 152/71 120/74  Pulse: 74 74 82 72  Resp: 18 20 (!) 21 20  Temp: 97.9 F (36.6 C) 97.7 F (36.5 C) 97.9 F (36.6 C) 97.7 F (36.5 C)  TempSrc: Oral  Oral Oral  SpO2: 90% 92% 92% 92%  Weight:      Height:  Intake/Output Summary (Last 24 hours) at 09/28/2020 1236 Last data filed at 09/28/2020 0825 Gross per 24 hour  Intake 1120 ml  Output 1300 ml  Net -180 ml   Filed Weights   09/27/20 0848  Weight: 59 kg    Examination:  General exam: Appears fatigued.  Appears frail Respiratory system: Poor respiratory  effort.  Lungs clear Cardiovascular system: S1-S2 heard, 2/6 systolic murmur, no pedal edema  gastrointestinal system: Nondistended, tender to palpation diffusely, normal bowel sounds Central nervous system: Alert and oriented. No focal neurological deficits. Extremities: Symmetric 5 x 5 power. Skin: No rashes, lesions or ulcers Psychiatry: Judgement and insight appear normal. Mood & affect flattened.     Data Reviewed: I have personally reviewed following labs and imaging studies  CBC: Recent Labs  Lab 09/27/20 0856 09/28/20 0415  WBC 16.4* 13.1*  HGB 13.0 12.6  HCT 38.9 38.3  MCV 91.5 93.6  PLT 353 144   Basic Metabolic Panel: Recent Labs  Lab 09/27/20 0856 09/28/20 0415  NA 134* 137  K 4.7 4.1  CL 100 110  CO2 19* 17*  GLUCOSE 139* 104*  BUN 82* 70*  CREATININE 2.12* 1.82*  CALCIUM 9.4 8.8*   GFR: Estimated Creatinine Clearance: 19.5 mL/min (A) (by C-G formula based on SCr of 1.82 mg/dL (H)). Liver Function Tests: No results for input(s): AST, ALT, ALKPHOS, BILITOT, PROT, ALBUMIN in the last 168 hours. No results for input(s): LIPASE, AMYLASE in the last 168 hours. No results for input(s): AMMONIA in the last 168 hours. Coagulation Profile: No results for input(s): INR, PROTIME in the last 168 hours. Cardiac Enzymes: No results for input(s): CKTOTAL, CKMB, CKMBINDEX, TROPONINI in the last 168 hours. BNP (last 3 results) No results for input(s): PROBNP in the last 8760 hours. HbA1C: No results for input(s): HGBA1C in the last 72 hours. CBG: No results for input(s): GLUCAP in the last 168 hours. Lipid Profile: No results for input(s): CHOL, HDL, LDLCALC, TRIG, CHOLHDL, LDLDIRECT in the last 72 hours. Thyroid Function Tests: No results for input(s): TSH, T4TOTAL, FREET4, T3FREE, THYROIDAB in the last 72 hours. Anemia Panel: No results for input(s): VITAMINB12, FOLATE, FERRITIN, TIBC, IRON, RETICCTPCT in the last 72 hours. Sepsis Labs: Recent Labs  Lab  09/27/20 0941 09/27/20 1115  LATICACIDVEN 1.5 1.5    Recent Results (from the past 240 hour(s))  Blood culture (routine x 2)     Status: None (Preliminary result)   Collection Time: 09/27/20  9:41 AM   Specimen: BLOOD  Result Value Ref Range Status   Specimen Description BLOOD LEFT ANTECUBITAL  Final   Special Requests   Final    BOTTLES DRAWN AEROBIC AND ANAEROBIC Blood Culture adequate volume   Culture   Final    NO GROWTH < 24 HOURS Performed at [facility], [address] [place], [place] [address]    Report Status PENDING  Incomplete  Urine culture     Status: Abnormal (Preliminary result)   Collection Time: 09/27/20 10:37 AM   Specimen: Urine, Random  Result Value Ref Range Status   Specimen Description   Final    URINE, RANDOM Performed at  [facility], [address], [place], [place] [address]    Special Requests   Final    NONE Performed at [facility], [address], [place], [place] [address]    Culture (A)  Final    80,000 COLONIES/mL Lonell Grandchild NEGATIVE RODS SUSCEPTIBILITIES TO FOLLOW Performed at [facility], [address] [place], [place] [address]    Report Status PENDING  Incomplete  Blood culture (routine x 2)     Status: None (Preliminary result)   Collection Time: 09/27/20 11:15 AM   Specimen: BLOOD  Result Value Ref Range Status   Specimen Description BLOOD LFA  Final   Special Requests   Final    BOTTLES DRAWN AEROBIC AND ANAEROBIC Blood Culture adequate volume   Culture   Final    NO GROWTH < 24 HOURS Performed at [facility], [address] [place], [place] [address]    Report Status PENDING  Incomplete  Respiratory Panel by RT PCR (Flu A&B, Covid) - Nasopharyngeal Swab     Status: None   Collection Time: 09/27/20 12:24 PM   Specimen: Nasopharyngeal Swab  Result Value Ref Range Status   SARS Coronavirus 2 by RT PCR NEGATIVE NEGATIVE Final    Comment: (NOTE) SARS-CoV-2 target nucleic acids  are NOT DETECTED.  The SARS-CoV-2 RNA is generally detectable in upper respiratoy specimens during the acute phase of infection. The lowest concentration of SARS-CoV-2 viral copies this assay can detect is 131 copies/mL. A negative result does not preclude SARS-Cov-2 infection and should not be used as the sole basis for treatment or other patient management decisions. A negative result may occur with  improper specimen collection/handling, submission of specimen other than nasopharyngeal swab, presence of viral mutation(s) within the areas targeted by this assay, and inadequate number of viral copies (<131 copies/mL). A negative result must be combined with clinical observations, patient history, and epidemiological information. The expected result is Negative.  Fact Sheet for Patients:  PinkCheek.be  Fact Sheet for Healthcare Providers:  GravelBags.it  This test is no t yet approved or cleared by the [place] FDA and  has been authorized for detection and/or diagnosis of SARS-CoV-2 by FDA under an Emergency Use Authorization (EUA). This EUA will remain  in effect (meaning this test can be used) for the duration of the COVID-19 declaration under Section 564(b)(1) of the Act, 21 U.S.C. section 360bbb-3(b)(1), unless the authorization is terminated or revoked sooner.     Influenza A by PCR NEGATIVE NEGATIVE Final   Influenza B by PCR NEGATIVE NEGATIVE Final    Comment: (NOTE) The Xpert Xpress SARS-CoV-2/FLU/RSV assay is intended as an aid in  the diagnosis of influenza from Nasopharyngeal swab specimens and  should not be used as a sole basis for treatment. Nasal washings and  aspirates are unacceptable for Xpert Xpress SARS-CoV-2/FLU/RSV  testing.  Fact Sheet for Patients: PinkCheek.be  Fact Sheet for Healthcare Providers: GravelBags.it  This test is not  yet approved or cleared by the [place] FDA and  has been authorized for detection and/or diagnosis of SARS-CoV-2 by  FDA under an Emergency Use Authorization (EUA). This EUA will remain  in effect (meaning this test can be used) for the duration of the  Covid-19 declaration under Section 564(b)(1) of the Act, 21  U.S.C. section 360bbb-3(b)(1), unless the authorization is  terminated or revoked. Performed at [facility], [address], [place], [place] [address]          Radiology Studies: CT Renal Stone Study  Result Date: 09/27/2020 CLINICAL DATA:  Back pain for 3 days. Stage 3 chronic kidney disease. Foul-smelling urine. Remote history of unspecified kidney surgery. EXAM: CT ABDOMEN AND PELVIS WITHOUT CONTRAST TECHNIQUE: Multidetector CT imaging of the abdomen and pelvis was performed following the standard protocol without IV contrast. COMPARISON:  01/03/2014 CT abdomen/pelvis. FINDINGS: Lower chest: Mild curvilinear scarring versus atelectasis at the dependent right lung base. Coronary atherosclerosis. Hepatobiliary: Normal liver size. No liver mass. Normal gallbladder with no radiopaque cholelithiasis. No biliary ductal dilatation. Pancreas: Normal, with no mass or duct dilation. Spleen: Normal size. No mass. Adrenals/Urinary Tract: No discrete adrenal nodules. Absent right kidney. Severe left hydronephrosis with prominently dilated extrarenal left renal pelvis, worsened since 2015 CT. New urothelial wall thickening throughout the left renal pelvis with surrounding fat haziness. New mild layering hyperdense material with punctate stones in the left renal pelvis and left renal collecting system. No contour deforming left renal masses. Abrupt caliber transition at the left ureteropelvic junction. No left ureteral stones. Mild diffuse bladder wall thickening. No bladder stones. Stomach/Bowel: Normal non-distended stomach. Right inguinal hernia contains a right pelvic small  bowel loop. No small bowel dilatation, wall thickening or pneumatosis. Appendix not discretely visualized. Normal large bowel with no diverticulosis, large bowel wall thickening or pericolonic fat stranding. Vascular/Lymphatic: Atherosclerotic abdominal aorta with ectatic 2.6 cm infrarenal abdominal aorta. No pathologically enlarged lymph nodes in the abdomen or pelvis. Reproductive: Status post hysterectomy, with no abnormal findings at the vaginal cuff. No adnexal mass. Other: No pneumoperitoneum, ascites or focal fluid collection. Musculoskeletal: No aggressive appearing focal osseous lesions. Severe degenerative disc disease throughout the lumbar spine with moderate lumbar dextroscoliosis. IMPRESSION: 1. Severe left hydronephrosis with prominently dilated extrarenal left renal pelvis, worsened since 2015 CT, with abrupt caliber transition at the left UPJ suggesting worsened chronic left UPJ stenosis. New urothelial wall thickening throughout the left renal pelvis with surrounding fat haziness, nonspecific, suspicious for left upper tract infection. New mild layering hyperdense material with punctate stones in the left renal pelvis and left renal collecting system. No left ureteral stones. 2. Absent right kidney. 3. Mild diffuse bladder wall thickening, nonspecific, cannot exclude acute cystitis. Suggest correlation with urinalysis. 4. Right inguinal hernia contains a right pelvic small bowel loop, with no evidence of acute bowel complication. 5. 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 Recommend follow-up ultrasound every 5 years. This recommendation follows ACR consensus guidelines: White Paper of the ACR Incidental Findings Committee II on Vascular Findings. J Am Coll Radiol 2013; 10:789-794. 6. Coronary atherosclerosis. 7. Aortic Atherosclerosis (ICD10-I70.0). Electronically Signed   By: Ilona Sorrel M.D.   On: 09/27/2020 11:50        Scheduled Meds: . amLODipine  10 mg Oral Daily  . aspirin EC  81  mg Oral Daily  . heparin  5,000 Units Subcutaneous Q8H  . levothyroxine  150 mcg Oral Daily  . metoprolol tartrate  25 mg Oral BID   Continuous Infusions: . sodium chloride 125 mL/hr at 09/28/20 0520  . cefTRIAXone (ROCEPHIN)  IV 1 g (09/28/20 0956)     LOS: 0 days    Time spent: 25 minutes    Sidney Ace, MD [facility] Pager [phone]  If 7PM-7AM, please contact night-coverage 09/28/2020, 12:36 PM

## 2020-09-28 NOTE — Chronic Care Management (AMB) (Signed)
Chronic Care Management    Clinical Social Work Follow Up Note  09/28/2020 Name: Cathy Solis MRN: [id number] DOB: 1941-10-06  VENESSA Solis is a 79 y.o. year old female who is a primary care patient of Valerie Roys, DO. The CCM team was consulted for assistance with Level of Care Concerns.   Review of patient status, including review of consultants reports, other relevant assessments, and collaboration with appropriate care team members and the patient's provider was performed as part of comprehensive patient evaluation and provision of chronic care management services.    SDOH (Social Determinants of Health) assessments performed: Yes    Facility-Administered Encounter Medications as of 09/28/2020  Medication  . 0.9 %  sodium chloride infusion  . acetaminophen (TYLENOL) tablet 650 mg   Or  . acetaminophen (TYLENOL) suppository 650 mg  . albuterol (VENTOLIN HFA) 108 (90 Base) MCG/ACT inhaler 2 puff  . amLODipine (NORVASC) tablet 10 mg  . aspirin EC tablet 81 mg  . cefTRIAXone (ROCEPHIN) 1 g in sodium chloride 0.9 % 100 mL IVPB  . heparin injection 5,000 Units  . hydrALAZINE (APRESOLINE) tablet 25 mg  . levothyroxine (SYNTHROID) tablet 150 mcg  . metoprolol tartrate (LOPRESSOR) tablet 25 mg  . ondansetron (ZOFRAN) tablet 4 mg   Or  . ondansetron (ZOFRAN) injection 4 mg  . traMADol (ULTRAM) tablet 50 mg   Outpatient Encounter Medications as of 09/28/2020  Medication Sig  . albuterol (VENTOLIN HFA) 108 (90 Base) MCG/ACT inhaler Inhale 2 puffs into the lungs every 6 (six) hours as needed for wheezing or shortness of breath.  [name] amLODipine (NORVASC) 10 MG tablet Take 1 tablet (10 mg total) by mouth daily.  [name] aspirin EC 81 MG tablet Take 81 mg by mouth daily. Swallow whole.  . ipratropium-albuterol (DUONEB) 0.5-2.5 (3) MG/3ML SOLN Take 3 mLs by nebulization 2 (two) times daily for 4 days. DuoNeb nebulizer treatments twice a day for 4 days followed by as needed for severe shortness  of breath (Patient taking differently: Take 3 mLs by nebulization 2 (two) times daily. )  . levothyroxine (SYNTHROID) 150 MCG tablet Take 1 tablet (150 mcg total) by mouth daily.  [name] lisinopril (ZESTRIL) 10 MG tablet Take 1 tablet (10 mg total) by mouth daily. TAKE 1 TABLET BY MOUTH EVERY DAY  . metoprolol tartrate (LOPRESSOR) 25 MG tablet Take 1 tablet (25 mg total) by mouth 2 (two) times daily.     Goals Addressed    .  SW- "Cathy Solis is in the hospital and not doing well." (pt-stated)        CARE PLAN ENTRY (see longitudinal plan of care for additional care plan information)  Current Barriers:  . Limited social support . Level of care concerns . Family and relationship dysfunction (patient resist help from family in fear of losing her independence) . Social Isolation . Limited access to caregiver . Cognitive Deficits . Memory Deficits  Clinical Social Work Clinical Goal(s):   [name] Over the next 120 days, patient/caregiver will work with SW to address concerns related to care coordination needs, lack of a stable support network and lack of Brewing technologist. LCSW will assist patient in gaining additional support in order to maintain health and increase her level of support in the home as family is concerned for her well-being as she lives alone and has difficulty providing appropriate care to herself.  . Over the next 120 days, patient will demonstrate improved adherence to self care as evidenced by implementing  healthy self-care into her daily routine such as: attending all medical appointments, deep breathing exercises, taking time for self-reflection, taking medications as prescribed, drinking water and daily exercise to improve mobility and mood. . Over the next 120 days, patient will demonstrate improved health management independence as evidenced by implementing healthy self-care skills and positive support/resources into her daily routine to help cope with stressors and  improve overall health and well-being  . Over the next 120 days, patient or caregiver will verbalize basic understanding of depression/stress process and self health management plan as evidenced by her participation in development of long term plan of care and institution of self health management strategies  Interventions: . Inter-disciplinary care team collaboration (see longitudinal plan of care) . Patient interviewed and appropriate assessments performed. LCSW spoke with patient's sister Cathy Solis on 09/28/20.  [name] Patient went to the ED on 09/27/20 after having back pain and nausea for the past 3 days. Patient had been sitting in her recliner and had not been getting up much for the last 3 days even after family encouraged her to go to the hospital. Patient would not allow her sisters to stay with her either even though she was unable to care for herself. Patient has stage 3 kidney disease and arrived at ED with foul smelling urine. Patient only has one kidney. Patient has lost 13 pounds in the last 60 days. Per sister, patient has a kidney infection, kidney stones and urine infection. She shares that her son is going to the hospital today to visit patient. He is her POA. Family are concerned that patient has limited support within the home and lives alone. Family report that "she often fights [place]. She didn't even want her son to come to town." Patient is often non-compliant and is fearful of LTC placement.  . Per sister, son has offered to pay out of pocket for an aide but she declined wanting someone in her home.  [name] LCSW educated family on petitioning the court system for guardianship and provided contact number for [employer]) [phone]). . Patient smokes heavily per sister.  . Family report that patient sometimes is forgetful and suspect dementia. Patient's cognition level changes per day per family.  . Provided patient with information about level of care  resources including: [place], PACE, ALF, SNF for rehab or long term, Adult Day Centers, [place], Private pay caregiving and [facility] Family report that patient does not want to increase her level of care in fear of losing her independence.  . Discussed plans with patient for ongoing care management follow up and provided patient with direct contact information for care management team . Advised patient's caregiver to contact CCM team for any case management needs  . Sister is concerned that patient continues to drive. Sister states that she even almost run over herself one day because of her inability to drive safely.  . Assisted patient/caregiver with obtaining information about health plan benefits . Provided education to patient/caregiver regarding level of care options.  Patient Self Care Activities:  . Attends all scheduled provider appointments . Lacks social connections . Has a difficult time asking for help sometimes  Initial goal documentation      Follow Up Plan: SW will follow up with patient by phone over the next 60 days  Eula Fried, [place], MSW, [email or web address] Phone: [phone]

## 2020-09-28 NOTE — Progress Notes (Signed)
Progress Note  Patient Name: Cathy Solis Date of Encounter: 09/28/2020  Primary Cardiologist: New to [facility] - consult by Agbor-Etang  Subjective   No chest pain, dyspnea, palpitations, or dizziness.   Inpatient Medications    Scheduled Meds: . amLODipine  10 mg Oral Daily  . aspirin EC  81 mg Oral Daily  . heparin  5,000 Units Subcutaneous Q8H  . levothyroxine  150 mcg Oral Daily  . metoprolol tartrate  25 mg Oral BID   Continuous Infusions: . sodium chloride 125 mL/hr at 09/28/20 0520  . cefTRIAXone (ROCEPHIN)  IV 1 g (09/28/20 0956)   PRN Meds: acetaminophen **OR** acetaminophen, albuterol, hydrALAZINE, ondansetron **OR** ondansetron (ZOFRAN) IV, traMADol   Vital Signs    Vitals:   09/28/20 0007 09/28/20 0406 09/28/20 0802 09/28/20 1140  BP: 139/70 (!) 127/59 (!) 152/71 120/74  Pulse: 74 74 82 72  Resp: 18 20 (!) 21 20  Temp: 97.9 F (36.6 C) 97.7 F (36.5 C) 97.9 F (36.6 C) 97.7 F (36.5 C)  TempSrc: Oral  Oral Oral  SpO2: 90% 92% 92% 92%  Weight:      Height:        Intake/Output Summary (Last 24 hours) at 09/28/2020 1240 Last data filed at 09/28/2020 0825 Gross per 24 hour  Intake 1120 ml  Output 1300 ml  Net -180 ml   Filed Weights   09/27/20 0848  Weight: 59 kg    Telemetry    Coarse Afib with controlled ventricular response - Personally Reviewed  ECG    No new tracings - Personally Reviewed  Physical Exam   GEN: Elderly, chronically ill and frail appearing; No acute distress.   Neck: No JVD. Cardiac: Irregularly irregular, no murmurs, rubs, or gallops.  Respiratory: Diminished breath sounds bilaterally, possibly from poor inspiratory effort.  GI: Soft, nontender, non-distended.   MS: No edema; No deformity. Neuro:  Alert and oriented x 3; Nonfocal.  Psych: Normal affect.  Labs    Chemistry Recent Labs  Lab 09/27/20 0856 09/28/20 0415  NA 134* 137  K 4.7 4.1  CL 100 110  CO2 19* 17*  GLUCOSE 139* 104*  BUN 82* 70*   CREATININE 2.12* 1.82*  CALCIUM 9.4 8.8*  GFRNONAA 23* 28*  ANIONGAP 15 10     Hematology Recent Labs  Lab 09/27/20 0856 09/28/20 0415  WBC 16.4* 13.1*  RBC 4.25 4.09  HGB 13.0 12.6  HCT 38.9 38.3  MCV 91.5 93.6  MCH 30.6 30.8  MCHC 33.4 32.9  RDW 13.5 13.5  PLT 353 318    Cardiac EnzymesNo results for input(s): TROPONINI in the last 168 hours. No results for input(s): TROPIPOC in the last 168 hours.   BNPNo results for input(s): BNP, PROBNP in the last 168 hours.   DDimer No results for input(s): DDIMER in the last 168 hours.   Radiology    CT Renal Stone Study  Result Date: 09/27/2020 IMPRESSION: 1. Severe left hydronephrosis with prominently dilated extrarenal left renal pelvis, worsened since 2015 CT, with abrupt caliber transition at the left UPJ suggesting worsened chronic left UPJ stenosis. New urothelial wall thickening throughout the left renal pelvis with surrounding fat haziness, nonspecific, suspicious for left upper tract infection. New mild layering hyperdense material with punctate stones in the left renal pelvis and left renal collecting system. No left ureteral stones. 2. Absent right kidney. 3. Mild diffuse bladder wall thickening, nonspecific, cannot exclude acute cystitis. Suggest correlation with urinalysis. 4. Right inguinal  hernia contains a right pelvic small bowel loop, with no evidence of acute bowel complication. 5. 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 Recommend follow-up ultrasound every 5 years. This recommendation follows ACR consensus guidelines: White Paper of the ACR Incidental Findings Committee II on Vascular Findings. J Am Coll Radiol 2013; 10:789-794. 6. Coronary atherosclerosis. 7. Aortic Atherosclerosis (ICD10-I70.0). Electronically Signed   By: Ilona Sorrel M.D.   On: 09/27/2020 11:50    Cardiac Studies   2D echo -Pending  Patient Profile     79 y.o. female with history of HTN, COPD and ongoing tobacco use who was admitted for  pyelonephritis, metabolic acidosis, and AKI and we are seeing for Afib/flutter.   Assessment & Plan    1. Afib: -Uncertain chronicity -EKG from 01/14/2019 concerning for possible Afib/flutter with study from 04/2019 showing sinus rhythm -Ventricular rates are well controlled -CHADS2VASc 6 -Due to fall risk, anticoagulation was been deferred at time of consult. In discussing this with her son,he indicates she has not fallen recently, though is frail. May need to revisit anticoagulation prior to discharge -Continue Lopressor for rate control, if added rate control is needed, could stop amlodipine to titrate metoprolol  -Echo pending -Potassium at goal -Recent TSH normal with repeat pending at this time  2. HTN: -Blood pressure well controlled -Remains on amlodipine and Lopressor    For questions or updates, please contact [facility] Please consult [email or web address] for contact info under Cardiology/STEMI.    Signed, Christell Faith, PA-C [place] Pager: [phone] 09/28/2020, 12:40 PM

## 2020-09-29 ENCOUNTER — Inpatient Hospital Stay: Payer: Medicare HMO

## 2020-09-29 ENCOUNTER — Inpatient Hospital Stay (HOSPITAL_COMMUNITY)
Admit: 2020-09-29 | Discharge: 2020-09-29 | Disposition: A | Discharge status: Home / Self Care | Payer: Medicare HMO | Attending: Cardiology | Admitting: Cardiology

## 2020-09-29 DIAGNOSIS — I361 Nonrheumatic tricuspid (valve) insufficiency: Secondary | ICD-10-CM | POA: Diagnosis not present

## 2020-09-29 DIAGNOSIS — I340 Nonrheumatic mitral (valve) insufficiency: Secondary | ICD-10-CM | POA: Diagnosis not present

## 2020-09-29 DIAGNOSIS — N10 Acute tubulo-interstitial nephritis: Secondary | ICD-10-CM | POA: Diagnosis not present

## 2020-09-29 DIAGNOSIS — I4891 Unspecified atrial fibrillation: Secondary | ICD-10-CM

## 2020-09-29 LAB — URINE CULTURE: Culture: 80000 — AB

## 2020-09-29 LAB — THYROID PANEL WITH TSH
Free Thyroxine Index: 4.1 (ref 1.2–4.9)
T3 Uptake Ratio: 42 % — ABNORMAL HIGH (ref 24–39)
T4, Total: 9.7 ug/dL (ref 4.5–12.0)
TSH: 1.55 u[IU]/mL (ref 0.450–4.500)

## 2020-09-29 LAB — ECHOCARDIOGRAM COMPLETE
AR max vel: 3.19 cm2
AV Area VTI: 3.65 cm2
AV Area mean vel: 3.64 cm2
AV Mean grad: 2 mmHg
AV Peak grad: 3.5 mmHg
Ao pk vel: 0.94 m/s
Area-P 1/2: 3.2 cm2
Height: 60 in
S' Lateral: 1.67 cm
Weight: 2080 oz

## 2020-09-29 MED ORDER — LIDOCAINE 5 % EX PTCH
1.0000 | MEDICATED_PATCH | CUTANEOUS | Status: DC
  Administered 2020-09-29 – 2020-10-04 (×5): 1 via TRANSDERMAL
  Filled 2020-09-29 (×7): qty 1

## 2020-09-29 MED ORDER — APIXABAN 2.5 MG PO TABS
2.5000 mg | ORAL_TABLET | Freq: Two times a day (BID) | ORAL | Status: DC
  Administered 2020-09-29 – 2020-10-02 (×8): 2.5 mg via ORAL
  Filled 2020-09-29 (×8): qty 1

## 2020-09-29 MED ORDER — WHITE PETROLATUM EX OINT
TOPICAL_OINTMENT | CUTANEOUS | Status: DC | PRN
  Filled 2020-09-29: qty 5

## 2020-09-29 MED ORDER — SALINE SPRAY 0.65 % NA SOLN
1.0000 | NASAL | Status: DC | PRN
  Filled 2020-09-29: qty 44

## 2020-09-29 MED ORDER — CEFDINIR 300 MG PO CAPS
300.0000 mg | ORAL_CAPSULE | Freq: Every day | ORAL | Status: DC
  Administered 2020-09-29 – 2020-09-30 (×2): 300 mg via ORAL
  Filled 2020-09-29 (×2): qty 1

## 2020-09-29 MED ORDER — FAMOTIDINE 20 MG PO TABS: 20.0000 mg | ORAL_TABLET | Freq: Two times a day (BID) | ORAL | Status: DC

## 2020-09-29 MED ORDER — FAMOTIDINE 20 MG PO TABS
10.0000 mg | ORAL_TABLET | Freq: Every day | ORAL | Status: DC
Start: 2020-09-29 — End: 2020-10-03
  Administered 2020-09-29 – 2020-10-02 (×4): 10 mg via ORAL
  Filled 2020-09-29 (×4): qty 1

## 2020-09-29 NOTE — Evaluation (Signed)
Occupational Therapy Evaluation Patient Details Name: Cathy Solis MRN: [id number] DOB: 14-Sep-1941 Today's Date: 09/29/2020    History of Present Illness 79 y.o. female with medical history significant for hypothyroid, hypertension, chronic tobacco use, current tobacco use, COPD, solitary left kidney with chronic hydronephrosis (right nephrectomy in 2008/2009 and minimal information available in Care Everywhere), paroxysmal A. fib, chronic bilateral carotid stenosis with 1 to 39% stenosis via ultrasound imaging on 11/28/2019, presents to the emergency department for chief concerns of weakness and back pain that started approximately 2 to 3 days ago   Clinical Impression   Patient presenting with decreased I in self care, balance,functional mobility/transfers, endurance, and safety awareness. Patient reports living at home alone and with use of RW and SPC at home as needed. Pt's sister lives nearby and comes over each day to visit or assist as needed. Pt does her own IADL tasks unless very fatigued and calling sister for assistance. Pt refusing all OOB activity this session and therefor bed mobility performed for repositioning and to assist with pain management. Sister present in the room. Patient will benefit from acute OT to increase overall independence in the areas of ADLs, functional mobility, and safety awareness in order to safely discharge to next venue of care.    Follow Up Recommendations  SNF    Equipment Recommendations  3 in 1 bedside commode       Precautions / Restrictions Precautions Precautions: Fall Restrictions Weight Bearing Restrictions: No      Mobility Bed Mobility Overal bed mobility: Needs Assistance Bed Mobility: Rolling Rolling: Mod assist         General bed mobility comments: use of bedrails    Transfers      General transfer comment: pt refusing        ADL either performed or assessed with clinical judgement   ADL Overall ADL's : Needs  assistance/impaired     Grooming: Wash/dry hands;Wash/dry face;Oral care;Set up;Supervision/safety;Bed level        General ADL Comments: pt refusing OOB tasks this session secondary to pain. OT anticipates pt needing increased time and min - mod A from bed level.     Vision Patient Visual Report: No change from baseline              Pertinent Vitals/Pain Pain Assessment: 0-10 Pain Score: 10-Worst pain ever Pain Location: back pain Pain Descriptors / Indicators: Aching;Grimacing;Guarding Pain Intervention(s): Limited activity within patient's tolerance;Monitored during session;Premedicated before session;Repositioned;Patient requesting pain meds-RN notified     Hand Dominance Right   Extremity/Trunk Assessment Upper Extremity Assessment Upper Extremity Assessment: Generalized weakness   Lower Extremity Assessment Lower Extremity Assessment: Generalized weakness       Communication Communication Communication: No difficulties   Cognition Arousal/Alertness: Awake/alert Behavior During Therapy: WFL for tasks assessed/performed Overall Cognitive Status: Within Functional Limits for tasks assessed          General Comments  pt refusal            Home Living Family/patient expects to be discharged to:: Private residence Living Arrangements: Alone Available Help at Discharge: Family;Available PRN/intermittently Type of Home: House Home Access: Level entry     Home Layout: One level     Bathroom Shower/Tub: Tub/shower unit         Home Equipment: Cane - single point;Walker - 2 wheels   Additional Comments: Pt's sister lives 1 mile away and checks in with pt each day as well as assists her when she calls.  Prior Functioning/Environment Level of Independence: Independent with assistive device(s)        Comments: Pt performs her own ADLs and IADLs but when she has a "bad day (pain)" or is very fatigued she calls her sister for help        OT  Problem List: Pain;Decreased activity tolerance;Decreased safety awareness;Decreased knowledge of use of DME or AE;Impaired balance (sitting and/or standing);Decreased knowledge of precautions;Decreased strength      OT Treatment/Interventions: Self-care/ADL training;Therapeutic exercise;Therapeutic activities;Energy conservation;Patient/family education;DME and/or AE instruction;Balance training    OT Goals(Current goals can be found in the care plan section) Acute Rehab OT Goals Patient Stated Goal: to rest with all this pain OT Goal Formulation: With patient Time For Goal Achievement: 10/13/20 Potential to Achieve Goals: Good ADL Goals Pt Will Perform Grooming: with modified independence;standing Pt Will Transfer to Toilet: with modified independence;ambulating Pt Will Perform Toileting - Clothing Manipulation and hygiene: with modified independence;sit to/from stand  OT Frequency: Min 2X/week              AM-PAC OT "6 Clicks" Daily Activity     Outcome Measure Help from another person eating meals?: A Little Help from another person taking care of personal grooming?: A Little Help from another person toileting, which includes using toliet, bedpan, or urinal?: A Lot Help from another person bathing (including washing, rinsing, drying)?: A Lot Help from another person to put on and taking off regular upper body clothing?: A Little Help from another person to put on and taking off regular lower body clothing?: A Lot 6 Click Score: 15   End of Session Equipment Utilized During Treatment: Rolling walker Nurse Communication: Mobility status  Activity Tolerance: Patient tolerated treatment well Patient left: in bed  OT Visit Diagnosis: Unsteadiness on feet (R26.81);Muscle weakness (generalized) (M62.81);Pain Pain - part of body:  (back)                Time: 6378-5885 OT Time Calculation (min): 22 min Charges:  OT General Charges $OT Visit: 1 Visit OT Evaluation $OT Eval Low  Complexity: 1 Low OT Treatments $Self Care/Home Management : 8-22 mins  Darleen Crocker, MS, OTR/L , CBIS ascom [phone]  09/29/20, 1:33 PM

## 2020-09-29 NOTE — Progress Notes (Signed)
*  PRELIMINARY RESULTS* Echocardiogram 2D Echocardiogram has been performed.  Sherrie Sport 09/29/2020, 12:19 PM

## 2020-09-29 NOTE — Progress Notes (Signed)
Mobility Specialist - Progress Note   09/29/20 1547  Mobility  Activity Turned to right side  Level of Assistance Moderate assist, patient does 50-74%  Assistive Device None (bed rails)  Distance Ambulated (ft) 0 ft  Mobility Response Tolerated well  Mobility performed by Mobility specialist  $Mobility charge 1 Mobility    Pre-mobility: 88 HR, 90% SpO2   Pt was lying in bed upon arrival on room air. Pt agreed to session. Pt initially c/o back pain, nausea, and fatigue. Pt denied SOB. Pt was encouraged to sit EOB, but declined d/t pain. Pt was then encouraged to reposition self in bed as she appeared to be slouching down. Pt was hesitant, but agreed. Nurse entered room mid-session and assisted with transitioning pt to [facility]. Pt was also able to turn to R side with modA, using bed rails for support. Pt denied nausea at the end of session, but continued to voice that she feels tired. Overall, pt tolerated session well. Pt was left in bed with all needs in reach, alarm set, and RN present.    Kathee Delton Mobility Specialist 09/29/20, 3:54 PM

## 2020-09-29 NOTE — Progress Notes (Addendum)
PROGRESS NOTE    Cathy Solis  OIZ:[id number] DOB: 06/12/1941 DOA: 09/27/2020 PCP: Valerie Roys, DO    Brief Narrative: Cathy Solis is a 79 y.o. female with medical history significant for hypothyroid, hypertension, chronic tobacco use, current tobacco use, COPD, solitary left kidney with chronic hydronephrosis (right nephrectomy in 2008/2009 and minimal information available in Care Everywhere), paroxysmal A. fib, chronic bilateral carotid stenosis with 1 to 39% stenosis via ultrasound imaging on 11/28/2019, presents to the emergency department for chief concerns of weakness and back pain that started approximately 2 to 3 days ago.  11/8: Patient reports persistent fatigue and malaise type symptoms.  No localizable pain.  Seen by urology this morning.  No intervention from their standpoint.  Patient will follow up with her outpatient urologist upon discharge.  Remains on IV antibiotics and fluids.  11/9: Patient seen and examined.  Urine culture significant for pansensitive E. coli.  Transition to p.o. cefdinir.  Kidney function improving but not at goal.  Leukocytosis also improving.  Patient is significantly weak and barely able to stand.  Pending therapy evaluations.      Assessment & Plan:   Principal Problem:   Acute pyelonephritis Active Problems:   Carotid stenosis   Tobacco abuse   Hypertension   COPD (chronic obstructive pulmonary disease) (HCC)   RBBB   Chronic kidney disease (CKD), stage III (moderate) (HCC)   UTI (urinary tract infection)   Unintentional weight loss   Debility   Atrial fibrillation ([place])   Pyelonephritis  Acute pyelonephritis Sepsis ruled out.  Tachycardia and tachypnea felt related to underlying lung disease and cardiac arrythmia Presents with back pain weakness x3 days UA positive for nitrates Urine culture significant for pansensitive E. coli Plan: DC IV Rocephin Start p.o. 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 Fib Seen by cardiology Currently rate controlled Anticoagulation initially deferred given acute illness Seen by Dr. Fletcher Anon who spoke to patient and family Decision made to start anticoagulation considering chads vascular of 6 Plan: Continue metoprolol for rate control Apixaban initiated  History of COPD Not acutely exacerbated Currently on room air As needed albuterol  Hypertension Continue metoprolol Continue amlodipine  Chronic bilateral carotid artery stenosis Aspirin  Hypothyroidism Synthroid  Weakness Decreased functional tolerance Patient is markedly weak Limited ability to stand and ambulate Not a safe discharge to home PT and OT evaluations Initial recommendation for skilled nursing facility Patient is reluctant Will monitor overnight and revisit in a.m. If need be can discharge home with home health   DVT prophylaxis: Subcutaneous heparin Code Status: Full Family Communication: Sister at bedside 09/29/2020 Disposition Plan: Status is: Inpatient  Remains inpatient appropriate because:Inpatient level of care appropriate due to severity of illness   Dispo: The patient is from: Home              Anticipated d/c is to: SNF              Anticipated d/c date is: 1 day              Patient currently is  not medically stable to d/c.   Patient is responding to antibiotic therapy we are able to transition to p.o. antibiotics however patient is significantly weak and debilitated.   Kidney function still not at baseline.  Leukocytosis improving but not completely resolved.  Therapy evaluations today.  Anticipate discharge to SNF versus home with home health within 24 hours.  Consultants:   Urology  Procedures:   None  Antimicrobials:  Rocephin   Subjective: Seen and examined.  Energy level improving.  Pain control improved.  Objective: Vitals:   09/29/20 0416 09/29/20 0743 09/29/20 1127 09/29/20 1431  BP: (!) 148/72 (!) 163/81 (!) 144/71   Pulse: 82 82 84 82  Resp: 18 18 15 18   Temp: 97.9 F (36.6 C) 97.6 F (36.4 C) 97.7 F (36.5 C)   TempSrc: Oral Oral    SpO2: 90% 93% 90% 91%  Weight:      Height:        Intake/Output Summary (Last 24 hours) at 09/29/2020 1452 Last data filed at 09/29/2020 0938 Gross per 24 hour  Intake 415 ml  Output 300 ml  Net 115 ml   Filed Weights   09/27/20 0848  Weight: 59 kg    Examination:  General exam: Appears fatigued.  Appears frail Respiratory system: Poor respiratory effort.  Lungs clear Cardiovascular system: S1-S2 heard, 2/6 systolic murmur, no pedal edema  gastrointestinal system: Nondistended, tender to palpation diffusely, normal bowel sounds Central nervous system: Alert and oriented. No focal neurological deficits. Extremities: Symmetric 5 x 5 power. Skin: No rashes, lesions or ulcers Psychiatry: Judgement and insight appear normal. Mood & affect flattened.     Data Reviewed: I have personally reviewed following labs and imaging studies  CBC: Recent Labs  Lab 09/27/20 0856 09/28/20 0415  WBC 16.4* 13.1*  HGB 13.0 12.6  HCT 38.9 38.3  MCV 91.5 93.6  PLT 353 426   Basic Metabolic Panel: Recent Labs  Lab 09/27/20 0856 09/28/20 0415  NA 134* 137  K 4.7 4.1  CL 100 110  CO2 19* 17*  GLUCOSE 139* 104*  BUN 82* 70*  CREATININE 2.12* 1.82*  CALCIUM 9.4 8.8*   GFR: Estimated Creatinine Clearance: 19.5 mL/min (A) (by C-G formula based on SCr of 1.82 mg/dL (H)). Liver Function  Tests: No results for input(s): AST, ALT, ALKPHOS, BILITOT, PROT, ALBUMIN in the last 168 hours. No results for input(s): LIPASE, AMYLASE in the last 168 hours. No results for input(s): AMMONIA in the last 168 hours. Coagulation Profile: No results for input(s): INR, PROTIME in the last 168 hours. Cardiac Enzymes: No results for input(s): CKTOTAL, CKMB, CKMBINDEX, TROPONINI in the last 168 hours. BNP (last 3 results) No results for input(s): PROBNP in the last 8760 hours. HbA1C: No results for input(s): HGBA1C in the last 72 hours. CBG: No results for input(s): GLUCAP in the last 168 hours. Lipid Profile: No results for input(s): CHOL, HDL, LDLCALC, TRIG, CHOLHDL, LDLDIRECT in the last 72 hours. Thyroid Function Tests: Recent Labs    09/27/20 2043  TSH 1.550  T4TOTAL 9.7   Anemia Panel: No results for input(s): VITAMINB12, FOLATE, FERRITIN, TIBC, IRON, RETICCTPCT in the last 72 hours. Sepsis Labs: Recent Labs  Lab 09/27/20 0941 09/27/20 1115  LATICACIDVEN 1.5 1.5    Recent Results (from the past 240 hour(s))  Blood culture (routine x 2)     Status: None (Preliminary result)   Collection Time: 09/27/20  9:41 AM   Specimen: BLOOD  Result Value Ref Range Status  Specimen Description BLOOD LEFT ANTECUBITAL  Final   Special Requests   Final    BOTTLES DRAWN AEROBIC AND ANAEROBIC Blood Culture adequate volume   Culture   Final    NO GROWTH 2 DAYS Performed at [facility], [address] [place], [place] [address]    Report Status PENDING  Incomplete  Urine culture     Status: Abnormal   Collection Time: 09/27/20 10:37 AM   Specimen: Urine, Random  Result Value Ref Range Status   Specimen Description   Final    URINE, RANDOM Performed at [facility], [address] [place], [place] [address]    Special Requests   Final    NONE Performed at [facility], [address] [place], [place] [address]    Culture 80,000 COLONIES/mL  ESCHERICHIA COLI (A)  Final   Report Status 09/29/2020 FINAL  Final   Organism ID, Bacteria ESCHERICHIA COLI (A)  Final      Susceptibility   Escherichia coli - MIC*    AMPICILLIN <=2 SENSITIVE Sensitive     CEFAZOLIN <=4 SENSITIVE Sensitive     CEFEPIME <=0.12 SENSITIVE Sensitive     CEFTRIAXONE <=0.25 SENSITIVE Sensitive     CIPROFLOXACIN <=0.25 SENSITIVE Sensitive     GENTAMICIN <=1 SENSITIVE Sensitive     IMIPENEM <=0.25 SENSITIVE Sensitive     NITROFURANTOIN <=16 SENSITIVE Sensitive     TRIMETH/SULFA <=20 SENSITIVE Sensitive     AMPICILLIN/SULBACTAM <=2 SENSITIVE Sensitive     PIP/TAZO <=4 SENSITIVE Sensitive     * 80,000 COLONIES/mL ESCHERICHIA COLI  Blood culture (routine x 2)     Status: None (Preliminary result)   Collection Time: 09/27/20 11:15 AM   Specimen: BLOOD  Result Value Ref Range Status   Specimen Description BLOOD LFA  Final   Special Requests   Final    BOTTLES DRAWN AEROBIC AND ANAEROBIC Blood Culture adequate volume   Culture   Final    NO GROWTH 2 DAYS Performed at [facility], [address], [place],  [address]    Report Status PENDING  Incomplete  Respiratory Panel by RT PCR (Flu A&B, Covid) - Nasopharyngeal Swab     Status: None   Collection Time: 09/27/20 12:24 PM   Specimen: Nasopharyngeal Swab  Result Value Ref Range Status   SARS Coronavirus 2 by RT PCR NEGATIVE NEGATIVE Final    Comment: (NOTE) SARS-CoV-2 target nucleic acids are NOT DETECTED.  The SARS-CoV-2 RNA is generally detectable in upper respiratoy specimens during the acute phase of infection. The lowest concentration of SARS-CoV-2 viral copies this assay can detect is 131 copies/mL. A negative result does not preclude SARS-Cov-2 infection and should not be used as the sole basis for treatment or other patient management decisions. A negative result may occur with  improper specimen collection/handling, submission of specimen other than nasopharyngeal swab,  presence of viral mutation(s) within the areas targeted by this assay, and inadequate number of viral copies (<131 copies/mL). A negative result must be combined with clinical observations, patient history, and epidemiological information. The expected result is Negative.  Fact Sheet for Patients:  PinkCheek.be  Fact Sheet for Healthcare Providers:  GravelBags.it  This test is no t yet approved or cleared by the [place] FDA and  has been authorized for detection and/or diagnosis of SARS-CoV-2 by FDA under an Emergency Use Authorization (EUA). This EUA will remain  in effect (meaning this test can be used) for the duration of the  COVID-19 declaration under Section 564(b)(1) of the Act, 21 U.S.C. section 360bbb-3(b)(1), unless the authorization is terminated or revoked sooner.     Influenza A by PCR NEGATIVE NEGATIVE Final   Influenza B by PCR NEGATIVE NEGATIVE Final    Comment: (NOTE) The Xpert Xpress SARS-CoV-2/FLU/RSV assay is intended as an aid in  the diagnosis of influenza from Nasopharyngeal swab specimens and  should not be used as a sole basis for treatment. Nasal washings and  aspirates are unacceptable for Xpert Xpress SARS-CoV-2/FLU/RSV  testing.  Fact Sheet for Patients: PinkCheek.be  Fact Sheet for Healthcare Providers: GravelBags.it  This test is not yet approved or cleared by the [place] FDA and  has been authorized for detection and/or diagnosis of SARS-CoV-2 by  FDA under an Emergency Use Authorization (EUA). This EUA will remain  in effect (meaning this test can be used) for the duration of the  Covid-19 declaration under Section 564(b)(1) of the Act, 21  U.S.C. section 360bbb-3(b)(1), unless the authorization is  terminated or revoked. Performed at [facility], [address] [place], [place] [address]           Radiology Studies: No results found.      Scheduled Meds: . amLODipine  10 mg Oral Daily  . apixaban  2.5 mg Oral BID  . aspirin EC  81 mg Oral Daily  . cefdinir  300 mg Oral Daily  . levothyroxine  150 mcg Oral Daily  . metoprolol tartrate  25 mg Oral BID   Continuous Infusions: . sodium chloride 75 mL/hr at 09/29/20 0334     LOS: 1 day    Time spent: 25 minutes    Sidney Ace, MD [facility] Pager [phone]  If 7PM-7AM, please contact night-coverage 09/29/2020, 2:52 PM

## 2020-09-29 NOTE — Evaluation (Signed)
Physical Therapy Evaluation Patient Details Name: Cathy Solis MRN: [id number] DOB: May 21, 1941 Today's Date: 09/29/2020   History of Present Illness  79 y.o. female with medical history significant for hypothyroid, hypertension, chronic tobacco use, current tobacco use, COPD, solitary left kidney with chronic hydronephrosis (right nephrectomy in 2008/2009 and minimal information available in Care Everywhere), paroxysmal A. fib, chronic bilateral carotid stenosis stenosis via ultrasound imaging on 11/28/2019, presents to the emergency department for chief concerns of weakness and back pain.   Clinical Impression  Pt initially refusing PT stating that "I have never felt this sick" and that she was too weak.  With encouragement she did agree to do some activity but admittedly she was very limited.  Apparently at baseline she is essentially independent, driving, able to run errands but she was extremely weak and limited this date.  She did manage to do some standing with a lot of assist getting to sitting and then upright, but she managed only a few side steps along EOB before essentially collapsing back onto the bed stating she was too weak and tired.  Vitals were relatively stable (HR did increased to 110s with the effort) but WNL.  Pt unsafe to return home as initially planned, recommending STR.    Follow Up Recommendations SNF    Equipment Recommendations  None recommended by PT    Recommendations for Other Services       Precautions / Restrictions Precautions Precautions: Fall Restrictions Weight Bearing Restrictions: No      Mobility  Bed Mobility Overal bed mobility: Needs Assistance Bed Mobility: Supine to Sit;Sit to Supine Rolling: Mod assist   Supine to sit: Min assist Sit to supine: Mod assist   General bed mobility comments: Pt relied on bed rails to get up to sitting, light assist with LEs, needed more assist with getting back into bed and to scoot up in bed     Transfers Overall transfer level: Needs assistance Equipment used: Rolling walker (2 wheeled) Transfers: Sit to/from Stand Sit to Stand: From elevated surface;Min assist         General transfer comment: pt was unable to rise to standing on her own from standard or elevated height, with much encouragement and direct assist was able to rise  Ambulation/Gait             General Gait Details: no true ambulation, but she was able to take a few unsure, guarded shuffle steps along EOB before needing to quickly sit down   Stairs            Wheelchair Mobility    Modified Rankin (Stroke Patients Only)       Balance                                             Pertinent Vitals/Pain Pain Assessment: Faces Pain Score: 1  Pain Location: back pain Pain Descriptors / Indicators: Aching;Grimacing;Guarding Pain Intervention(s): Limited activity within patient's tolerance;Monitored during session;Premedicated before session;Repositioned;Patient requesting pain meds-RN notified    Home Living Family/patient expects to be discharged to:: Unsure Living Arrangements: Alone Available Help at Discharge: Family;Available PRN/intermittently Type of Home: House Home Access: Level entry     Home Layout: One level Home Equipment: Cane - single point;Walker - 2 wheels Additional Comments: Pt's sister lives 1 mile away and checks in with pt each day as well as assists  her when she calls.    Prior Function Level of Independence: Independent with assistive device(s)         Comments: Pt performs her own ADLs and IADLs, states she does her grocery shopping and errands, but when she has a "bad day (pain)" or is very fatigued she calls her sister for help     Hand Dominance   Dominant Hand: Right    Extremity/Trunk Assessment   Upper Extremity Assessment Upper Extremity Assessment: Generalized weakness    Lower Extremity Assessment Lower Extremity  Assessment: Generalized weakness       Communication   Communication: No difficulties  Cognition Arousal/Alertness: Awake/alert Behavior During Therapy: WFL for tasks assessed/performed Overall Cognitive Status: Within Functional Limits for tasks assessed                                        General Comments General comments (skin integrity, edema, etc.): pt refusal    Exercises     Assessment/Plan    PT Assessment Patient needs continued PT services  PT Problem List Decreased strength;Decreased range of motion;Decreased activity tolerance;Decreased balance;Decreased mobility;Decreased knowledge of use of DME;Decreased safety awareness;Pain       PT Treatment Interventions DME instruction;Gait training;Functional mobility training;Therapeutic activities;Therapeutic exercise;Balance training;Neuromuscular re-education;Patient/family education    PT Goals (Current goals can be found in the Care Plan section)  Acute Rehab PT Goals Patient Stated Goal: pt hopes to improve enough to go home PT Goal Formulation: With patient Time For Goal Achievement: 10/13/20 Potential to Achieve Goals: Fair    Frequency Min 2X/week   Barriers to discharge        Co-evaluation               AM-PAC PT "6 Clicks" Mobility  Outcome Measure Help needed turning from your back to your side while in a flat bed without using bedrails?: A Little Help needed moving from lying on your back to sitting on the side of a flat bed without using bedrails?: A Little Help needed moving to and from a bed to a chair (including a wheelchair)?: A Lot Help needed standing up from a chair using your arms (e.g., wheelchair or bedside chair)?: A Little Help needed to walk in hospital room?: A Lot Help needed climbing 3-5 steps with a railing? : Total 6 Click Score: 14    End of Session Equipment Utilized During Treatment: Gait belt Activity Tolerance: Patient limited by fatigue Patient  left: with bed alarm set;with call bell/phone within reach   PT Visit Diagnosis: Muscle weakness (generalized) (M62.81);Difficulty in walking, not elsewhere classified (R26.2);Unsteadiness on feet (R26.81)    Time: 8206-0156 PT Time Calculation (min) (ACUTE ONLY): 27 min   Charges:   PT Evaluation $PT Eval Low Complexity: 1 Low PT Treatments $Therapeutic Activity: 8-22 mins        Kreg Shropshire, DPT 09/29/2020, 4:40 PM

## 2020-09-29 NOTE — Progress Notes (Signed)
PT Cancellation Note  Patient Details Name: Cathy Solis MRN: [id number] DOB: Jun 03, 1941   Cancelled Treatment:    Reason Eval/Treat Not Completed: Patient at procedure or test/unavailable Chart reviewed, attempted to see pt before lunch but she was having echocardiogram.  Will try back later as time allows and pt is appropriate.  Kreg Shropshire, DPT 09/29/2020, 1:22 PM

## 2020-09-29 NOTE — Progress Notes (Signed)
Progress Note  Patient Name: Cathy Solis Date of Encounter: 09/29/2020  Primary Cardiologist: New to [facility] - consult by Agbor-Etang  Subjective   No chest pain, dyspnea, palpitations, or dizziness. She is agreeable to start [facility] in the setting of no falls and CHADS2VASc of 6.   Inpatient Medications    Scheduled Meds: . amLODipine  10 mg Oral Daily  . aspirin EC  81 mg Oral Daily  . cefdinir  300 mg Oral Daily  . heparin  5,000 Units Subcutaneous Q8H  . levothyroxine  150 mcg Oral Daily  . metoprolol tartrate  25 mg Oral BID   Continuous Infusions: . sodium chloride 75 mL/hr at 09/29/20 0334   PRN Meds: acetaminophen **OR** acetaminophen, albuterol, hydrALAZINE, oxyCODONE, sodium chloride, white petrolatum   Vital Signs    Vitals:   09/28/20 2029 09/29/20 0008 09/29/20 0416 09/29/20 0743  BP: 137/73 132/70 (!) 148/72 (!) 163/81  Pulse: 76  82 82  Resp: 18 18 18 18   Temp: 98.4 F (36.9 C) 98.3 F (36.8 C) 97.9 F (36.6 C) 97.6 F (36.4 C)  TempSrc: Oral Oral Oral Oral  SpO2: 91% 95% 90% 93%  Weight:      Height:        Intake/Output Summary (Last 24 hours) at 09/29/2020 1038 Last data filed at 09/29/2020 0550 Gross per 24 hour  Intake 175 ml  Output 300 ml  Net -125 ml   Filed Weights   09/27/20 0848  Weight: 59 kg    Telemetry    Afib with controlled ventricular response with ventricular rates in the 80s to 90s bpm - Personally Reviewed  ECG    No new tracings - Personally Reviewed  Physical Exam   GEN: Elderly, chronically ill and frail appearing; No acute distress.   Neck: No JVD. Cardiac: Irregularly irregular, no murmurs, rubs, or gallops.  Respiratory: Diminished breath sounds bilaterally, possibly from poor inspiratory effort.  GI: Soft, nontender, non-distended.   MS: No edema; No deformity. Neuro:  Alert and oriented x 3; Nonfocal.  Psych: Normal affect.  Labs    Chemistry Recent Labs  Lab 09/27/20 0856 09/28/20 0415  NA  134* 137  K 4.7 4.1  CL 100 110  CO2 19* 17*  GLUCOSE 139* 104*  BUN 82* 70*  CREATININE 2.12* 1.82*  CALCIUM 9.4 8.8*  GFRNONAA 23* 28*  ANIONGAP 15 10     Hematology Recent Labs  Lab 09/27/20 0856 09/28/20 0415  WBC 16.4* 13.1*  RBC 4.25 4.09  HGB 13.0 12.6  HCT 38.9 38.3  MCV 91.5 93.6  MCH 30.6 30.8  MCHC 33.4 32.9  RDW 13.5 13.5  PLT 353 318    Cardiac EnzymesNo results for input(s): TROPONINI in the last 168 hours. No results for input(s): TROPIPOC in the last 168 hours.   BNPNo results for input(s): BNP, PROBNP in the last 168 hours.   DDimer No results for input(s): DDIMER in the last 168 hours.   Radiology    CT Renal Stone Study  Result Date: 09/27/2020 IMPRESSION: 1. Severe left hydronephrosis with prominently dilated extrarenal left renal pelvis, worsened since 2015 CT, with abrupt caliber transition at the left UPJ suggesting worsened chronic left UPJ stenosis. New urothelial wall thickening throughout the left renal pelvis with surrounding fat haziness, nonspecific, suspicious for left upper tract infection. New mild layering hyperdense material with punctate stones in the left renal pelvis and left renal collecting system. No left ureteral stones. 2. Absent  right kidney. 3. Mild diffuse bladder wall thickening, nonspecific, cannot exclude acute cystitis. Suggest correlation with urinalysis. 4. Right inguinal hernia contains a right pelvic small bowel loop, with no evidence of acute bowel complication. 5. 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 Recommend follow-up ultrasound every 5 years. This recommendation follows ACR consensus guidelines: White Paper of the ACR Incidental Findings Committee II on Vascular Findings. J Am Coll Radiol 2013; 10:789-794. 6. Coronary atherosclerosis. 7. Aortic Atherosclerosis (ICD10-I70.0). Electronically Signed   By: Ilona Sorrel M.D.   On: 09/27/2020 11:50    Cardiac Studies   2D echo -Pending  Patient Profile     79  y.o. female with history of HTN, COPD and ongoing tobacco use who was admitted for pyelonephritis, metabolic acidosis, and AKI and we are seeing for Afib/flutter.   Assessment & Plan    1. Afib: -Uncertain chronicity -Ventricular rates are well controlled -CHADS2VASc 6 -Due to fall risk, anticoagulation was been deferred at time of consult. In discussing this with her son,he indicates she has not fallen recently, though is frail -She is now agreeable to starting Eliquis 2.5 mg bid (age, weight, and SCr) -Continue Lopressor for rate control, if added rate control is needed, could stop amlodipine to titrate metoprolol  -Echo pending -Potassium at goal -Recent TSH normal with repeat pending at this time  2. HTN: -Blood pressure has been well controlled overall, though is elevated this morning -Remains on amlodipine and Lopressor -Continue to monitor BP, if subsequent readings remain elevated, could titrate Lopressor     For questions or updates, please contact [facility] Please consult [email or web address] for contact info under Cardiology/STEMI.    Signed, Christell Faith, PA-C [place] Pager: [phone] 09/29/2020, 10:38 AM

## 2020-09-30 ENCOUNTER — Encounter: Payer: Self-pay | Admitting: Internal Medicine

## 2020-09-30 DIAGNOSIS — I480 Paroxysmal atrial fibrillation: Secondary | ICD-10-CM | POA: Diagnosis not present

## 2020-09-30 DIAGNOSIS — N10 Acute tubulo-interstitial nephritis: Secondary | ICD-10-CM | POA: Diagnosis not present

## 2020-09-30 LAB — BASIC METABOLIC PANEL
Anion gap: 12 (ref 5–15)
BUN: 37 mg/dL — ABNORMAL HIGH (ref 8–23)
CO2: 15 mmol/L — ABNORMAL LOW (ref 22–32)
Calcium: 8.7 mg/dL — ABNORMAL LOW (ref 8.9–10.3)
Chloride: 109 mmol/L (ref 98–111)
Creatinine, Ser: 1.3 mg/dL — ABNORMAL HIGH (ref 0.44–1.00)
GFR, Estimated: 41 mL/min — ABNORMAL LOW (ref >60)
Glucose, Bld: 99 mg/dL (ref 70–99)
Potassium: 3.5 mmol/L (ref 3.5–5.1)
Sodium: 136 mmol/L (ref 135–145)

## 2020-09-30 LAB — BLOOD GAS, ARTERIAL
Acid-base deficit: 7.2 mmol/L — ABNORMAL HIGH (ref 0.0–2.0)
Bicarbonate: 15.6 mmol/L — ABNORMAL LOW (ref 20.0–28.0)
FIO2: 0.28
O2 Saturation: 92.8 %
Patient temperature: 37
pCO2 arterial: 24 mmHg — ABNORMAL LOW (ref 32.0–48.0)
pH, Arterial: 7.42 (ref 7.350–7.450)
pO2, Arterial: 65 mmHg — ABNORMAL LOW (ref 83.0–108.0)

## 2020-09-30 LAB — CBC WITH DIFFERENTIAL/PLATELET
Abs Immature Granulocytes: 0.15 10*3/uL — ABNORMAL HIGH (ref 0.00–0.07)
Basophils Absolute: 0 10*3/uL (ref 0.0–0.1)
Basophils Relative: 0 %
Eosinophils Absolute: 0 10*3/uL (ref 0.0–0.5)
Eosinophils Relative: 0 %
HCT: 37.5 % (ref 36.0–46.0)
Hemoglobin: 12.5 g/dL (ref 12.0–15.0)
Immature Granulocytes: 2 %
Lymphocytes Relative: 9 %
Lymphs Abs: 0.9 10*3/uL (ref 0.7–4.0)
MCH: 30.2 pg (ref 26.0–34.0)
MCHC: 33.3 g/dL (ref 30.0–36.0)
MCV: 90.6 fL (ref 80.0–100.0)
Monocytes Absolute: 0.8 10*3/uL (ref 0.1–1.0)
Monocytes Relative: 8 %
Neutro Abs: 7.8 10*3/uL — ABNORMAL HIGH (ref 1.7–7.7)
Neutrophils Relative %: 81 %
Platelets: 322 10*3/uL (ref 150–400)
RBC: 4.14 MIL/uL (ref 3.87–5.11)
RDW: 13.3 % (ref 11.5–15.5)
WBC: 9.6 10*3/uL (ref 4.0–10.5)
nRBC: 0 % (ref 0.0–0.2)

## 2020-09-30 MED ORDER — IPRATROPIUM-ALBUTEROL 0.5-2.5 (3) MG/3ML IN SOLN
3.0000 mL | Freq: Four times a day (QID) | RESPIRATORY_TRACT | Status: DC
  Administered 2020-09-30 – 2020-10-01 (×5): 3 mL via RESPIRATORY_TRACT
  Filled 2020-09-30 (×5): qty 3

## 2020-09-30 MED ORDER — NICOTINE 21 MG/24HR TD PT24
21.0000 mg | MEDICATED_PATCH | Freq: Every day | TRANSDERMAL | Status: DC
  Administered 2020-09-30 – 2020-10-05 (×2): 21 mg via TRANSDERMAL
  Filled 2020-09-30 (×5): qty 1

## 2020-09-30 MED ORDER — SODIUM BICARBONATE 650 MG PO TABS
650.0000 mg | ORAL_TABLET | Freq: Three times a day (TID) | ORAL | Status: DC
  Administered 2020-09-30 – 2020-10-01 (×4): 650 mg via ORAL
  Filled 2020-09-30 (×4): qty 1

## 2020-09-30 MED ORDER — LEVOFLOXACIN 500 MG PO TABS: 500.0000 mg | ORAL_TABLET | Freq: Once | ORAL | Status: DC

## 2020-09-30 MED ORDER — LEVOFLOXACIN 250 MG PO TABS
250.0000 mg | ORAL_TABLET | Freq: Every evening | ORAL | Status: DC
  Filled 2020-09-30: qty 1

## 2020-09-30 MED ORDER — LEVOFLOXACIN 750 MG PO TABS
750.0000 mg | ORAL_TABLET | Freq: Once | ORAL | Status: AC
  Administered 2020-09-30: 750 mg via ORAL
  Filled 2020-09-30: qty 1

## 2020-09-30 MED ORDER — METOPROLOL TARTRATE 50 MG PO TABS
50.0000 mg | ORAL_TABLET | Freq: Two times a day (BID) | ORAL | Status: DC
  Administered 2020-09-30 – 2020-10-05 (×10): 50 mg via ORAL
  Filled 2020-09-30 (×11): qty 1

## 2020-09-30 MED ORDER — FLUTICASONE FUROATE-VILANTEROL 200-25 MCG/INH IN AEPB
1.0000 | INHALATION_SPRAY | Freq: Every day | RESPIRATORY_TRACT | Status: DC
  Administered 2020-10-01 – 2020-10-05 (×5): 1 via RESPIRATORY_TRACT
  Filled 2020-09-30: qty 28

## 2020-09-30 NOTE — Progress Notes (Signed)
PHARMACY NOTE:  ANTIMICROBIAL RENAL DOSAGE ADJUSTMENT  Current antimicrobial regimen includes a mismatch between antimicrobial dosage and estimated renal function.  As per policy approved by the Pharmacy & Therapeutics and Medical Executive Committees, the antimicrobial dosage will be adjusted accordingly.  Current antimicrobial dosage:  Levofloxacin 500 mg x1 dose then 250 mg QD for 2 doses. Given patient has pyelonephritis the typical dose, if no renal dysfunction, is 750 mg daily. Therefore, in the setting of poor renal function the dose will need to be adjusted (see below). Levofloxacin can be adjusted to 750 mg X1 dose followed 500 mg Q48H or 250 mg Q24H.   Indication:  Pyelonephritis  Renal Function:  Estimated Creatinine Clearance: 27.3 mL/min (A) (by C-G formula based on SCr of 1.3 mg/dL (H)). []      On intermittent HD, scheduled: []      On CRRT    Antimicrobial dosage has been changed to:  Levofloxacin 750 mg x1 dose followed 250 mg Q24H (will not changed original order).      Thank you for allowing pharmacy to be a part of this patient's care.  Rowland Lathe, [facility] 09/30/2020 5:38 PM

## 2020-09-30 NOTE — NC FL2 (Signed)
[place] LEVEL OF CARE SCREENING TOOL     IDENTIFICATION  Patient Name: Cathy Solis Birthdate: April 26, 1941 Sex: female Admission Date (Current Location): 09/27/2020  [facility] and [place] Number:  Engineering geologist and Address:         Provider Number: [id number]  Attending Physician Name and Address:  Jennye Boroughs, MD  Relative Name and Phone Number:       Current Level of Care: Hospital Recommended Level of Care: [place] Prior Approval Number:    Date Approved/Denied:   PASRR Number: [id number]  Discharge Plan: SNF    Current Diagnoses: Patient Active Problem List   Diagnosis Date Noted  . Pyelonephritis 09/28/2020  . UTI (urinary tract infection) 09/27/2020  . Acute pyelonephritis 09/27/2020  . Unintentional weight loss 09/27/2020  . Debility 09/27/2020  . Atrial fibrillation ([place])   . Moderate protein-calorie malnutrition ([place]) 09/03/2020  . 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 Chronic kidney disease (CKD), stage III (moderate) ([place]) 08/20/2019  . RBBB 05/09/2019  . Pressure injury of skin 01/15/2019  . COPD (chronic obstructive pulmonary disease) ([place]) 01/14/2019  . Hypertension 05/04/2018  . Osteoporosis 11/02/2017  . Coronary atherosclerosis 05/23/2016  . Personal history of tobacco use, presenting hazards to health 03/18/2016  . Idiopathic scoliosis 11/28/2014  . Tobacco abuse 11/28/2014  . 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 Hydronephrosis 02/04/2014  . 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 Carotid stenosis 05/23/2013  . Pernicious anemia 06/26/2012  . Hyperlipidemia 12/29/2011  . 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 Hypothyroidism 08/04/2011    Orientation RESPIRATION BLADDER Height & Weight     Self, Time, Situation, Place  O2 (2L [place]) Incontinent, External catheter Weight: 59 kg Height:  5' (152.4 cm)  BEHAVIORAL SYMPTOMS/MOOD NEUROLOGICAL BOWEL  NUTRITION STATUS      Continent Diet (Heart Healthy)  AMBULATORY STATUS COMMUNICATION OF NEEDS Skin   Extensive Assist Verbally PU Stage and Appropriate Care                       Personal Care Assistance Level of Assistance              Functional Limitations Info             SPECIAL CARE FACTORS FREQUENCY  PT (By licensed PT), OT (By licensed OT)                    Contractures Contractures Info: Not present    Additional Factors Info  Code Status, Allergies Code Status Info: Full Allergies Info: Statins, Zetia           Current Medications (09/30/2020):  This is the current hospital active medication list Current Facility-Administered Medications  Medication Dose Route Frequency Provider Last Rate Last Admin  . acetaminophen (TYLENOL) tablet 650 mg  650 mg Oral Q6H PRN Cox, Amy N, DO   650 mg at 09/29/20 1245   Or  . acetaminophen (TYLENOL) suppository 650 mg  650 mg Rectal Q6H PRN Cox, Amy N, DO      . amLODipine (NORVASC) tablet 10 mg  10 mg Oral Daily Cox, Amy N, DO   10 mg at 09/30/20 1022  . apixaban (ELIQUIS) tablet 2.5 mg  2.5 mg Oral BID Christell Faith M, PA-C   2.5 mg at 09/30/20 1022  . cefdinir (OMNICEF) capsule 300 mg  300 mg Oral Daily Sreenath, Sudheer B, MD   300 mg at  09/30/20 1022  . famotidine (PEPCID) tablet 10 mg  10 mg Oral Daily Ralene Muskrat B, MD   10 mg at 09/30/20 1022  . ipratropium-albuterol (DUONEB) 0.5-2.5 (3) MG/3ML nebulizer solution 3 mL  3 mL Nebulization Q6H Jennye Boroughs, MD   3 mL at 09/30/20 1353  . levothyroxine (SYNTHROID) tablet 150 mcg  150 mcg Oral Daily Cox, Amy N, DO   150 mcg at 09/30/20 0501  . lidocaine (LIDODERM) 5 % 1 patch  1 patch Transdermal Q24H Ralene Muskrat B, MD   1 patch at 09/29/20 1755  . metoprolol tartrate (LOPRESSOR) tablet 50 mg  50 mg Oral BID Christell Faith M, PA-C   50 mg at 09/30/20 1022  . nicotine (NICODERM CQ - dosed in mg/24 hours) patch 21 mg  21 mg Transdermal Daily Jennye Boroughs, MD   21 mg at 09/30/20 1240  . oxyCODONE (Oxy IR/ROXICODONE) immediate release tablet 5 mg  5 mg Oral Q4H PRN Ralene Muskrat B, MD   5 mg at 09/29/20 1731  . sodium chloride (OCEAN) 0.65 % nasal spray 1 spray  1 spray Each Nare PRN Sreenath, Sudheer B, MD      . white petrolatum (VASELINE) gel   Topical PRN Sidney Ace, MD         Discharge Medications: Please see discharge summary for a list of discharge medications.  Relevant Imaging Results:  Relevant Lab Results:   Additional Information SS# [id number]  Beverly Sessions, RN

## 2020-09-30 NOTE — Progress Notes (Signed)
Progress Note  Patient Name: Cathy Solis Date of Encounter: 09/30/2020  Primary Cardiologist: New to [facility] - consult by Agbor-Etang  Subjective   No chest pain, dyspnea, palpitations, or dizziness. Some abdominal pain overnight. Notes toe pain this morning. Issues with phone in her room, not being able to order food. She was started on renally dosed Eliquis on 11/9 in the setting of no falls and CHADS2VASc of 6.   Inpatient Medications    Scheduled Meds: . amLODipine  10 mg Oral Daily  . apixaban  2.5 mg Oral BID  . cefdinir  300 mg Oral Daily  . famotidine  10 mg Oral Daily  . levothyroxine  150 mcg Oral Daily  . lidocaine  1 patch Transdermal Q24H  . metoprolol tartrate  25 mg Oral BID   Continuous Infusions: . sodium chloride 75 mL/hr at 09/30/20 0506   PRN Meds: acetaminophen **OR** acetaminophen, albuterol, oxyCODONE, sodium chloride, white petrolatum   Vital Signs    Vitals:   09/29/20 2042 09/30/20 0007 09/30/20 0501 09/30/20 0743  BP: (!) 148/85 (!) 154/79 (!) 153/74 (!) 147/86  Pulse: 94 90 96 99  Resp: 16 16 18 18   Temp: 98.3 F (36.8 C) 98.3 F (36.8 C) 98.3 F (36.8 C) 98 F (36.7 C)  TempSrc: Oral Oral Oral Oral  SpO2: 92% 92% (!) 89% 91%  Weight:      Height:        Intake/Output Summary (Last 24 hours) at 09/30/2020 0902 Last data filed at 09/30/2020 6834 Gross per 24 hour  Intake 1128.06 ml  Output 1050 ml  Net 78.06 ml   Filed Weights   09/27/20 0848  Weight: 59 kg    Telemetry    Afib with controlled ventricular response with ventricular rates in the 80s to 90s bpm - Personally Reviewed  ECG    No new tracings - Personally Reviewed  Physical Exam   GEN: Elderly, chronically ill and frail appearing; No acute distress.   Neck: No JVD. Cardiac: Irregularly irregular, no murmurs, rubs, or gallops.  Respiratory: Diminished breath sounds bilaterally, possibly from poor inspiratory effort.  GI: Soft, nontender, non-distended.    MS: No edema; No deformity. Neuro:  Alert and oriented x 3; Nonfocal.  Psych: Normal affect.  Labs    Chemistry Recent Labs  Lab 09/27/20 0856 09/28/20 0415  NA 134* 137  K 4.7 4.1  CL 100 110  CO2 19* 17*  GLUCOSE 139* 104*  BUN 82* 70*  CREATININE 2.12* 1.82*  CALCIUM 9.4 8.8*  GFRNONAA 23* 28*  ANIONGAP 15 10     Hematology Recent Labs  Lab 09/27/20 0856 09/28/20 0415  WBC 16.4* 13.1*  RBC 4.25 4.09  HGB 13.0 12.6  HCT 38.9 38.3  MCV 91.5 93.6  MCH 30.6 30.8  MCHC 33.4 32.9  RDW 13.5 13.5  PLT 353 318    Cardiac EnzymesNo results for input(s): TROPONINI in the last 168 hours. No results for input(s): TROPIPOC in the last 168 hours.   BNPNo results for input(s): BNP, PROBNP in the last 168 hours.   DDimer No results for input(s): DDIMER in the last 168 hours.   Radiology    CT Renal Stone Study  Result Date: 09/27/2020 IMPRESSION: 1. Severe left hydronephrosis with prominently dilated extrarenal left renal pelvis, worsened since 2015 CT, with abrupt caliber transition at the left UPJ suggesting worsened chronic left UPJ stenosis. New urothelial wall thickening throughout the left renal pelvis with surrounding  fat haziness, nonspecific, suspicious for left upper tract infection. New mild layering hyperdense material with punctate stones in the left renal pelvis and left renal collecting system. No left ureteral stones. 2. Absent right kidney. 3. Mild diffuse bladder wall thickening, nonspecific, cannot exclude acute cystitis. Suggest correlation with urinalysis. 4. Right inguinal hernia contains a right pelvic small bowel loop, with no evidence of acute bowel complication. 5. 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 Recommend follow-up ultrasound every 5 years. This recommendation follows ACR consensus guidelines: White Paper of the ACR Incidental Findings Committee II on Vascular Findings. J Am Coll Radiol 2013; 10:789-794. 6. Coronary atherosclerosis. 7. Aortic  Atherosclerosis (ICD10-I70.0). Electronically Signed   By: Ilona Sorrel M.D.   On: 09/27/2020 11:50    Cardiac Studies   2D echo 1. Left ventricular ejection fraction, by estimation, is 70 to 75%. The  left ventricle has hyperdynamic function. The left ventricle has no  regional wall motion abnormalities. Left ventricular diastolic parameters  are consistent with Grade I diastolic  dysfunction (impaired relaxation).  2. Right ventricular systolic function is normal. The right ventricular  size is mildly enlarged. Mildly increased right ventricular wall  thickness. There is moderately elevated pulmonary artery systolic  pressure.  3. Right atrial size was moderately dilated.  4. The mitral valve is degenerative. Mild mitral valve regurgitation. No  evidence of mitral stenosis.  5. Tricuspid valve regurgitation is mild to moderate.  6. The aortic valve is tricuspid. There is mild calcification of the  aortic valve. There is moderate thickening of the aortic valve. Aortic  valve regurgitation is not visualized. Mild to moderate aortic valve  sclerosis/calcification is present, without  any evidence of aortic stenosis.   Patient Profile     79 y.o. female with history of HTN, COPD and ongoing tobacco use who was admitted for pyelonephritis, metabolic acidosis, and AKI and we are seeing for Afib/flutter.   Assessment & Plan    1. Afib: -Uncertain chronicity -Ventricular rates are well controlled -CHADS2VASc 6 -Due to fall risk, anticoagulation was been deferred at time of consult. In discussing this with her son,he indicates she has not fallen recently, though is frail -She was agreeable to starting Eliquis 2.5 mg bid (age, weight, and SCr) on 11/9 and has tolerated this well -Titrate Lopressor to 50 mg bid for added rate and BP control -Echo as above -Potassium at goal -Recent TSH normal with repeat pending at this time  2. HTN: -Blood pressure has been mildly elevated  over the past 24-48 hours -Increase Lopressor as above -Remains on amlodipine   3. Pulmonary hypertension: -Likely in the setting of underlying COPD -Appears to be at baseline -Not requiring standing diuretic  -Outpatient follow up   For questions or updates, please contact [place] Please consult [email or web address] for contact info under Cardiology/STEMI.    Signed, Christell Faith, PA-C [place] Pager: [phone] 09/30/2020, 9:02 AM

## 2020-09-30 NOTE — Progress Notes (Signed)
Order was placed to turn patient every two hours. Patient very weak and fatigue and not wanting to move. RN encouraged patient to turn and move as much as possible to prevent pressure injury. Patient currently has a stage 1 pressure injury on sacrum. New foam protectant and barrier cream was applied to buttocks and linens/pads were changed. MD notified.   Thresa Ross, RN

## 2020-09-30 NOTE — Progress Notes (Addendum)
Progress Note    Cathy Solis  IOX:[id number] DOB: 05/25/1941  DOA: 09/27/2020 PCP: Valerie Roys, DO      Brief Narrative:    Medical records reviewed and are as summarized below:  Cathy Solis is a 79 y.o. female       Assessment/Plan:   Principal Problem:   Acute pyelonephritis Active Problems:   Carotid stenosis   Tobacco abuse   Hypertension   COPD (chronic obstructive pulmonary disease) (HCC)   RBBB   Chronic kidney disease (CKD), stage III (moderate) (HCC)   UTI (urinary tract infection)   Unintentional weight loss   Debility   Atrial fibrillation (HCC)    Body mass index is 25.39 kg/m.    Acute pyelonephritis, cystitis: Change Omnicef to Levaquin for better coverage.  AKI on CKD stage IIIa, metabolic acidosis, left solitary kidney, chronic UPJ obstruction and hydronephrosis: Patient was evaluated by the urologist and outpatient follow-up was recommended.  Discontinue IV fluids.  Start oral sodium bicarbonate.  Paroxysmal atrial fibrillation: Continue metoprolol for rate control and Eliquis for stroke prophylaxis.  COPD: Continue bronchodilators.  Add Breo Ellipta.  Probable chronic hypoxemic respiratory failure from COPD.  Taper off oxygen as able.  ABG showed pH 7.42, PCO2 24 and PO2 of 65 on 2 L of oxygen.  Generalized weakness: PT and OT recommended discharge to SNF for rehabilitation.  Follow-up social worker to assist with disposition.  Other comorbidities include hypertension, chronic bilateral carotid artery stenosis, hypothyroidism.   Diet Order            Diet Heart Room service appropriate? Yes; Fluid consistency: Thin  Diet effective now                    Consultants:  Cardiologist  Urologist:  Procedures:  None    Medications:   . amLODipine  10 mg Oral Daily  . apixaban  2.5 mg Oral BID  . famotidine  10 mg Oral Daily  . ipratropium-albuterol  3 mL Nebulization Q6H  . [START ON 10/01/2020]  levofloxacin  250 mg Oral QPM  . levofloxacin  500 mg Oral Once  . levothyroxine  150 mcg Oral Daily  . lidocaine  1 patch Transdermal Q24H  . metoprolol tartrate  50 mg Oral BID  . nicotine  21 mg Transdermal Daily   Continuous Infusions:   Anti-infectives (From admission, onward)   Start     Dose/Rate Route Frequency Ordered Stop   10/01/20 1800  levofloxacin (LEVAQUIN) tablet 250 mg        250 mg Oral Every evening 09/30/20 1710 10/03/20 1759   09/30/20 1800  levofloxacin (LEVAQUIN) tablet 500 mg        500 mg Oral  Once 09/30/20 1710     09/29/20 1200  cefdinir (OMNICEF) capsule 300 mg  Status:  Discontinued        300 mg Oral Daily 09/29/20 0945 09/30/20 1710   09/28/20 1000  cefTRIAXone (ROCEPHIN) 1 g in sodium chloride 0.9 % 100 mL IVPB  Status:  Discontinued        1 g 200 mL/hr over 30 Minutes Intravenous Every 24 hours 09/27/20 1333 09/29/20 0945   09/27/20 1115  cefTRIAXone (ROCEPHIN) 2 g in sodium chloride 0.9 % 100 mL IVPB        2 g 200 mL/hr over 30 Minutes Intravenous  Once 09/27/20 1111 09/27/20 1154  Family Communication/Anticipated D/C date and plan/Code Status   DVT prophylaxis: apixaban (ELIQUIS) tablet 2.5 mg Start: 09/29/20 1145 Place TED hose Start: 09/27/20 1326 apixaban (ELIQUIS) tablet 2.5 mg     Code Status: Full Code  Family Communication: Plan discussed with her son at the bedside Disposition Plan:    Status is: Inpatient  Remains inpatient appropriate because:Unsafe d/c plan, IV treatments appropriate due to intensity of illness or inability to take PO and Inpatient level of care appropriate due to severity of illness   Dispo: The patient is from: Home              Anticipated d/c is to: SNF              Anticipated d/c date is: 2 days              Patient currently is not medically stable to d/c.           Subjective:   C/o generalized weakness and generalized body pain.  She also complains of cough and  difficulty breathing.  No chest pain  Objective:    Vitals:   09/30/20 0743 09/30/20 1103 09/30/20 1357 09/30/20 1541  BP: (!) 147/86 (!) 149/80  138/69  Pulse: 99 85  80  Resp: 18 20  18   Temp: 98 F (36.7 C) 98.8 F (37.1 C)  98 F (36.7 C)  TempSrc: Oral Oral    SpO2: 91% 96% 97% 97%  Weight:      Height:       No data found.   Intake/Output Summary (Last 24 hours) at 09/30/2020 1710 Last data filed at 09/30/2020 0648 Gross per 24 hour  Intake 40 ml  Output 1050 ml  Net -1010 ml   Filed Weights   09/27/20 0848  Weight: 59 kg    Exam:  GEN: NAD SKIN: No rash EYES: EOMI ENT: MMM CV: RRR PULM: Decreased air entry bilaterally.  Bilateral rhonchi. ABD: soft, ND, NT, +BS CNS: AAO x 3, non focal EXT: No edema or tenderness   Data Reviewed:   I have personally reviewed following labs and imaging studies:  Labs: Labs show the following:   Basic Metabolic Panel: Recent Labs  Lab 09/27/20 0856 09/27/20 0856 09/28/20 0415 09/30/20 0900  NA 134*  --  137 136  K 4.7   < > 4.1 3.5  CL 100  --  110 109  CO2 19*  --  17* 15*  GLUCOSE 139*  --  104* 99  BUN 82*  --  70* 37*  CREATININE 2.12*  --  1.82* 1.30*  CALCIUM 9.4  --  8.8* 8.7*   < > = values in this interval not displayed.   GFR Estimated Creatinine Clearance: 27.3 mL/min (A) (by C-G formula based on SCr of 1.3 mg/dL (H)). Liver Function Tests: No results for input(s): AST, ALT, ALKPHOS, BILITOT, PROT, ALBUMIN in the last 168 hours. No results for input(s): LIPASE, AMYLASE in the last 168 hours. No results for input(s): AMMONIA in the last 168 hours. Coagulation profile No results for input(s): INR, PROTIME in the last 168 hours.  CBC: Recent Labs  Lab 09/27/20 0856 09/28/20 0415 09/30/20 0853 09/30/20 0940  WBC 16.4* 13.1* ORMOD 9.6  NEUTROABS  --   --  PENDING 7.8*  HGB 13.0 12.6 ORMOD 12.5  HCT 38.9 38.3 ORMOD 37.5  MCV 91.5 93.6 ORMOD 90.6  PLT 353 318 ORMOD 322   Cardiac  Enzymes: No results for input(s):  CKTOTAL, CKMB, CKMBINDEX, TROPONINI in the last 168 hours. BNP (last 3 results) No results for input(s): PROBNP in the last 8760 hours. CBG: No results for input(s): GLUCAP in the last 168 hours. D-Dimer: No results for input(s): DDIMER in the last 72 hours. Hgb A1c: No results for input(s): HGBA1C in the last 72 hours. Lipid Profile: No results for input(s): CHOL, HDL, LDLCALC, TRIG, CHOLHDL, LDLDIRECT in the last 72 hours. Thyroid function studies: Recent Labs    09/27/20 2043  TSH 1.550  T4TOTAL 9.7   Anemia work up: No results for input(s): VITAMINB12, FOLATE, FERRITIN, TIBC, IRON, RETICCTPCT in the last 72 hours. Sepsis Labs: Recent Labs  Lab 09/27/20 0856 09/27/20 0941 09/27/20 1115 09/28/20 0415 09/30/20 0853 09/30/20 0940  WBC 16.4*  --   --  13.1* ORMOD 9.6  LATICACIDVEN  --  1.5 1.5  --   --   --     Microbiology Recent Results (from the past 240 hour(s))  Blood culture (routine x 2)     Status: None (Preliminary result)   Collection Time: 09/27/20  9:41 AM   Specimen: BLOOD  Result Value Ref Range Status   Specimen Description BLOOD LEFT ANTECUBITAL  Final   Special Requests   Final    BOTTLES DRAWN AEROBIC AND ANAEROBIC Blood Culture adequate volume   Culture   Final    NO GROWTH 3 DAYS Performed at [facility], [address], [place], [place] [address]    Report Status PENDING  Incomplete  Urine culture     Status: Abnormal   Collection Time: 09/27/20 10:37 AM   Specimen: Urine, Random  Result Value Ref Range Status   Specimen Description   Final    URINE, RANDOM Performed at [facility], [address] [place], [place] [address]    Special Requests   Final    NONE Performed at [facility], [address] [place], [place] [address]    Culture 80,000 COLONIES/mL ESCHERICHIA COLI (A)  Final   Report Status 09/29/2020 FINAL  Final   Organism ID, Bacteria ESCHERICHIA COLI (A)   Final      Susceptibility   Escherichia coli - MIC*    AMPICILLIN <=2 SENSITIVE Sensitive     CEFAZOLIN <=4 SENSITIVE Sensitive     CEFEPIME <=0.12 SENSITIVE Sensitive     CEFTRIAXONE <=0.25 SENSITIVE Sensitive     CIPROFLOXACIN <=0.25 SENSITIVE Sensitive     GENTAMICIN <=1 SENSITIVE Sensitive     IMIPENEM <=0.25 SENSITIVE Sensitive     NITROFURANTOIN <=16 SENSITIVE Sensitive     TRIMETH/SULFA <=20 SENSITIVE Sensitive     AMPICILLIN/SULBACTAM <=2 SENSITIVE Sensitive     PIP/TAZO <=4 SENSITIVE Sensitive     * 80,000 COLONIES/mL ESCHERICHIA COLI  Blood culture (routine x 2)     Status: None (Preliminary result)   Collection Time: 09/27/20 11:15 AM   Specimen: BLOOD  Result Value Ref Range Status   Specimen Description BLOOD LFA  Final   Special Requests   Final    BOTTLES DRAWN AEROBIC AND ANAEROBIC Blood Culture adequate volume   Culture   Final    NO GROWTH 3 DAYS Performed at [facility], [address] [place], [place] [address]    Report Status PENDING  Incomplete  Respiratory Panel by RT PCR (Flu A&B, Covid) - Nasopharyngeal Swab     Status: None   Collection Time: 09/27/20 12:24 PM   Specimen: Nasopharyngeal Swab  Result Value Ref Range Status  SARS Coronavirus 2 by RT PCR NEGATIVE NEGATIVE Final    Comment: (NOTE) SARS-CoV-2 target nucleic acids are NOT DETECTED.  The SARS-CoV-2 RNA is generally detectable in upper respiratoy specimens during the acute phase of infection. The lowest concentration of SARS-CoV-2 viral copies this assay can detect is 131 copies/mL. A negative result does not preclude SARS-Cov-2 infection and should not be used as the sole basis for treatment or other patient management decisions. A negative result may occur with  improper specimen collection/handling, submission of specimen other than nasopharyngeal swab, presence of viral mutation(s) within the areas targeted by this assay, and inadequate number of viral copies (<131  copies/mL). A negative result must be combined with clinical observations, patient history, and epidemiological information. The expected result is Negative.  Fact Sheet for Patients:  PinkCheek.be  Fact Sheet for Healthcare Providers:  GravelBags.it  This test is no t yet approved or cleared by the [place] FDA and  has been authorized for detection and/or diagnosis of SARS-CoV-2 by FDA under an Emergency Use Authorization (EUA). This EUA will remain  in effect (meaning this test can be used) for the duration of the COVID-19 declaration under Section 564(b)(1) of the Act, 21 U.S.C. section 360bbb-3(b)(1), unless the authorization is terminated or revoked sooner.     Influenza A by PCR NEGATIVE NEGATIVE Final   Influenza B by PCR NEGATIVE NEGATIVE Final    Comment: (NOTE) The Xpert Xpress SARS-CoV-2/FLU/RSV assay is intended as an aid in  the diagnosis of influenza from Nasopharyngeal swab specimens and  should not be used as a sole basis for treatment. Nasal washings and  aspirates are unacceptable for Xpert Xpress SARS-CoV-2/FLU/RSV  testing.  Fact Sheet for Patients: PinkCheek.be  Fact Sheet for Healthcare Providers: GravelBags.it  This test is not yet approved or cleared by the [place] FDA and  has been authorized for detection and/or diagnosis of SARS-CoV-2 by  FDA under an Emergency Use Authorization (EUA). This EUA will remain  in effect (meaning this test can be used) for the duration of the  Covid-19 declaration under Section 564(b)(1) of the Act, 21  U.S.C. section 360bbb-3(b)(1), unless the authorization is  terminated or revoked. Performed at [facility], [address] [place], [place] [address]     Procedures and diagnostic studies:  DG Chest [facility] View  Result Date: 09/29/2020 CLINICAL DATA:  Chest pain. EXAM:  PORTABLE CHEST 1 VIEW COMPARISON:  January 14, 2019. FINDINGS: The heart size and mediastinal contours are within normal limits. No pneumothorax is noted. Right basilar atelectasis or infiltrate is noted. Small bilateral pleural effusions are noted. The visualized skeletal structures are unremarkable. IMPRESSION: Right basilar atelectasis or infiltrate is noted. Small bilateral pleural effusions. Followup PA and lateral chest X-ray is recommended in 3-4 weeks following trial of antibiotic therapy to ensure resolution and exclude underlying malignancy. Aortic Atherosclerosis (ICD10-I70.0). Electronically Signed   By: Marijo Conception M.D.   On: 09/29/2020 18:52   ECHOCARDIOGRAM COMPLETE  Result Date: 09/29/2020    ECHOCARDIOGRAM REPORT   Patient Name:   MAKYAH LAVIGNE Date of Exam: 09/29/2020 Medical Rec #:  [id number]      Height:       60.0 in Accession #:    [id number]     Weight:       130.0 lb Date of Birth:  07-19-1941      BSA:          1.554 m Patient Age:  79 years       BP:           144/71 mmHg Patient Gender: F              HR:           84 bpm. Exam Location:  [facility] Procedure: 2D Echo, Cardiac Doppler and Color Doppler Indications:     Atrial Fibrillation 427.31  History:         Patient has no prior history of Echocardiogram examinations.                  Stroke; Risk Factors:Hypertension.  Sonographer:     Sherrie Sport RDCS (AE) Referring Phys:  [id number] Kate Sable Diagnosing Phys: Nelva Bush MD IMPRESSIONS  1. Left ventricular ejection fraction, by estimation, is 70 to 75%. The left ventricle has hyperdynamic function. The left ventricle has no regional wall motion abnormalities. Left ventricular diastolic parameters are consistent with Grade I diastolic dysfunction (impaired relaxation).  2. Right ventricular systolic function is normal. The right ventricular size is mildly enlarged. Mildly increased right ventricular wall thickness. There is moderately elevated pulmonary artery systolic  pressure.  3. Right atrial size was moderately dilated.  4. The mitral valve is degenerative. Mild mitral valve regurgitation. No evidence of mitral stenosis.  5. Tricuspid valve regurgitation is mild to moderate.  6. The aortic valve is tricuspid. There is mild calcification of the aortic valve. There is moderate thickening of the aortic valve. Aortic valve regurgitation is not visualized. Mild to moderate aortic valve sclerosis/calcification is present, without any evidence of aortic stenosis. FINDINGS  Left Ventricle: Left ventricular ejection fraction, by estimation, is 70 to 75%. The left ventricle has hyperdynamic function. The left ventricle has no regional wall motion abnormalities. The left ventricular internal cavity size was normal in size. There is borderline left ventricular hypertrophy. Left ventricular diastolic parameters are consistent with Grade I diastolic dysfunction (impaired relaxation). Right Ventricle: Pulmonary artery pressure is 40-45 mmHg plus central venous pressure. The right ventricular size is mildly enlarged. Mildly increased right ventricular wall thickness. Right ventricular systolic function is normal. There is moderately elevated pulmonary artery systolic pressure. Left Atrium: Left atrial size was normal in size. Right Atrium: Right atrial size was moderately dilated. Pericardium: There is no evidence of pericardial effusion. Mitral Valve: The mitral valve is degenerative in appearance. There is mild thickening of the mitral valve leaflet(s). There is mild calcification of the mitral valve leaflet(s). Mild mitral valve regurgitation. No evidence of mitral valve stenosis. Tricuspid Valve: The tricuspid valve is not well visualized. Tricuspid valve regurgitation is mild to moderate. Aortic Valve: The aortic valve is tricuspid. There is mild calcification of the aortic valve. There is moderate thickening of the aortic valve. Aortic valve regurgitation is not visualized. Mild to  moderate aortic valve sclerosis/calcification is present, without any evidence of aortic stenosis. Aortic valve mean gradient measures 2.0 mmHg. Aortic valve peak gradient measures 3.5 mmHg. Aortic valve area, by VTI measures 3.65 cm. Pulmonic Valve: The pulmonic valve was not well visualized. Pulmonic valve regurgitation is not visualized. Aorta: The aortic root is normal in size and structure. Pulmonary Artery: The pulmonary artery is of normal size. Venous: The inferior vena cava was not well visualized. IAS/Shunts: The interatrial septum was not assessed.  LEFT VENTRICLE PLAX 2D LVIDd:         3.17 cm LVIDs:         1.67 cm LV PW:  0.89 cm LV IVS:        1.07 cm LVOT diam:     2.00 cm LV SV:         62 LV SV Index:   40 LVOT Area:     3.14 cm  RIGHT VENTRICLE RV Basal diam:  2.64 cm RV S prime:     17.80 cm/s TAPSE (M-mode): 2.9 cm LEFT ATRIUM             Index       RIGHT ATRIUM           Index LA diam:        4.30 cm 2.77 cm/m  RA Area:     27.30 cm LA Vol (A2C):   42.7 ml 27.47 ml/m RA Volume:   94.40 ml  60.73 ml/m LA Vol (A4C):   48.4 ml 31.14 ml/m LA Biplane Vol: 48.5 ml 31.20 ml/m  AORTIC VALVE                   PULMONIC VALVE AV Area (Vmax):    3.19 cm    PV Vmax:        0.75 m/s AV Area (Vmean):   3.64 cm    PV Peak grad:   2.2 mmHg AV Area (VTI):     3.65 cm    RVOT Peak grad: 2 mmHg AV Vmax:           94.00 cm/s AV Vmean:          63.550 cm/s AV VTI:            0.169 m AV Peak Grad:      3.5 mmHg AV Mean Grad:      2.0 mmHg LVOT Vmax:         95.30 cm/s LVOT Vmean:        73.700 cm/s LVOT VTI:          0.197 m LVOT/AV VTI ratio: 1.16  AORTA Ao Root diam: 2.60 cm MITRAL VALVE                TRICUSPID VALVE MV Area (PHT): 3.20 cm     TR Peak grad:   41.5 mmHg MV Decel Time: 237 msec     TR Vmax:        322.00 cm/s MV E velocity: 129.00 cm/s                             SHUNTS                             Systemic VTI:  0.20 m                             Systemic Diam: 2.00 cm  Nelva Bush MD Electronically signed by Nelva Bush MD Signature Date/Time: 09/29/2020/5:02:52 PM    Final                LOS: 2 days   Micharl Helmes  [facility]   Pager on [email or web address]. If 7PM-7AM, please contact night-coverage at [email or web address]     09/30/2020, 5:10 PM

## 2020-09-30 NOTE — Progress Notes (Signed)
Mobility Specialist - Progress Note   09/30/20 1030  Mobility  Activity Stood at bedside;Dangled on edge of bed (took ~ 5 R lateral steps towards HOB)  Level of Assistance Minimal assist, patient does 75% or more (SBA w/ bed mobility)  Assistive Device Front wheel walker  Mobility Response Tolerated well  Mobility performed by Mobility specialist  $Mobility charge 1 Mobility    Pt laying in bed upon arrival. Pt agreed to session. Pt able to get to EOB w/ SBA. Noted soiled linens when pt dangled EOB. Noted pt SOB dangling EOB. O2 sat 86-87%. Pt on RA. Pt states she is feeling lightheaded and dizzy. Nurse was notified. Per discussion w/ nurse, pt just received an order to be placed on 2L O2 [place]. NT notified of soiled linens and to bring O2 [place] cord. Pt able to S2S w/ min. Assist for boost. VC required for hand placement and sequencing. While pt stood at bedside, NT changed linens. Pt able to take ~ 5 R lateral steps towards [facility] w/ CGA. No LOB noted. Pt able to get back to supine SBA. 2L O2 [place] placed. O2 sat up to 92%. Overall, pt tolerated session well. Mobility limited by pt feeling fatigue and "weak". Pt left laying in bed w/ alarm set. All needs placed in reach. Nurse was notified.    Kofi Murrell Mobility Specialist  09/30/20, 10:44 AM

## 2020-09-30 NOTE — TOC Initial Note (Signed)
Transition of Care [facility]) - Initial/Assessment Note    Patient Details  Name: Cathy Solis MRN: [id number] Date of Birth: 1941/09/21  [facility] CM/SW Contact:    Beverly Sessions, RN Phone Number: 09/30/2020, 3:32 PM  Clinical Narrative:                 Patient admitted with acute pyelonephritis  Patient has not felt well today, and is resting  Assessment completed with son [name].  Patient lives at home alone.  Son lives in [place].  Sister lives locally  At baseline patient can still drive, or her sister provides transporation.  Patient has cane and Rw in the home for ambulation   PCP [place] - denies issues obtaining medications  PT has assessed patient and recommends SNF.  Patient and son in agreement.  PASRR obtained Fl2 sent for signature Bed search initiated  Per MD patient not medically ready to initiate insurance auth   Expected Discharge Plan: Skilled Nursing Facility Barriers to Discharge: Continued Medical Work up   Patient Goals and CMS Choice        Expected Discharge Plan and Services Expected Discharge Plan: [place]       Living arrangements for the past 2 months: [place]                                      Prior Living Arrangements/Services Living arrangements for the past 2 months: Single Family Home Lives with:: Self Patient language and need for interpreter reviewed:: Yes Do you feel safe going back to the place where you live?: Yes      Need for Family Participation in Patient Care: Yes (Comment) Care giver support system in place?: Yes (comment) Current home services: DME Criminal Activity/Legal Involvement Pertinent to Current Situation/Hospitalization: No - Comment as needed  Activities of Daily Living Home Assistive Devices/Equipment: Cane (specify quad or straight) ADL Screening (condition at time of admission) Patient's cognitive ability adequate to safely  complete daily activities?: Yes Is the patient deaf or have difficulty hearing?: No Does the patient have difficulty seeing, even when wearing glasses/contacts?: No Does the patient have difficulty concentrating, remembering, or making decisions?: No Patient able to express need for assistance with ADLs?: Yes Does the patient have difficulty dressing or bathing?: Yes Independently performs ADLs?: No Communication: Independent Dressing (OT): Needs assistance Is this a change from baseline?: Change from baseline, expected to last >3 days Grooming: Needs assistance Is this a change from baseline?: Change from baseline, expected to last >3 days Feeding: Independent Bathing: Needs assistance Is this a change from baseline?: Change from baseline, expected to last >3 days Toileting: Needs assistance Is this a change from baseline?: Change from baseline, expected to last >3days In/Out Bed: Needs assistance Is this a change from baseline?: Change from baseline, expected to last >3 days Walks in Home: Independent Does the patient have difficulty walking or climbing stairs?: Yes Weakness of Legs: Both Weakness of Arms/Hands: None  Permission Sought/Granted                  Emotional Assessment       Orientation: : Oriented to Self, Oriented to Place, Oriented to  Time, Oriented to Situation   Psych Involvement: No (comment)  Admission diagnosis:  UTI (urinary tract infection) [N39.0] Pyelonephritis [N12] UPJ (ureteropelvic junction) obstruction [N13.5] Sepsis secondary to  UTI ([place]) [A41.9, N39.0] Patient Active Problem List   Diagnosis Date Noted  . Pyelonephritis 09/28/2020  . UTI (urinary tract infection) 09/27/2020  . Acute pyelonephritis 09/27/2020  . Unintentional weight loss 09/27/2020  . Debility 09/27/2020  . Atrial fibrillation ([place])   . Moderate protein-calorie malnutrition ([place]) 09/03/2020  . 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 Chronic kidney disease (CKD), stage III  (moderate) ([place]) 08/20/2019  . RBBB 05/09/2019  . Pressure injury of skin 01/15/2019  . COPD (chronic obstructive pulmonary disease) ([place]) 01/14/2019  . Hypertension 05/04/2018  . Osteoporosis 11/02/2017  . Coronary atherosclerosis 05/23/2016  . Personal history of tobacco use, presenting hazards to health 03/18/2016  . Idiopathic scoliosis 11/28/2014  . Tobacco abuse 11/28/2014  . 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 Hydronephrosis 02/04/2014  . 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 Carotid stenosis 05/23/2013  . Pernicious anemia 06/26/2012  . Hyperlipidemia 12/29/2011  . 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 Hypothyroidism 08/04/2011   PCP:  Valerie Roys, DO Pharmacy:   [facility], [address], [place] [address] Phone: [phone] Fax: [phone]  [id number] [place], [address] [place] [address] Phone: [phone] Fax: [phone]     Social Determinants of Health (SDOH) Interventions    Readmission Risk Interventions No flowsheet data found.

## 2020-10-01 DIAGNOSIS — N10 Acute tubulo-interstitial nephritis: Secondary | ICD-10-CM | POA: Diagnosis not present

## 2020-10-01 LAB — MAGNESIUM: Magnesium: 1.8 mg/dL (ref 1.7–2.4)

## 2020-10-01 LAB — BASIC METABOLIC PANEL
Anion gap: 8 (ref 5–15)
BUN: 31 mg/dL — ABNORMAL HIGH (ref 8–23)
CO2: 19 mmol/L — ABNORMAL LOW (ref 22–32)
Calcium: 8.7 mg/dL — ABNORMAL LOW (ref 8.9–10.3)
Chloride: 105 mmol/L (ref 98–111)
Creatinine, Ser: 1.48 mg/dL — ABNORMAL HIGH (ref 0.44–1.00)
GFR, Estimated: 35 mL/min — ABNORMAL LOW (ref >60)
Glucose, Bld: 105 mg/dL — ABNORMAL HIGH (ref 70–99)
Potassium: 3.7 mmol/L (ref 3.5–5.1)
Sodium: 132 mmol/L — ABNORMAL LOW (ref 135–145)

## 2020-10-01 LAB — PHOSPHORUS: Phosphorus: 2.4 mg/dL — ABNORMAL LOW (ref 2.5–4.6)

## 2020-10-01 MED ORDER — GUAIFENESIN-DM 100-10 MG/5ML PO SYRP: 5.0000 mL | ORAL_SOLUTION | ORAL | Status: DC | PRN

## 2020-10-01 MED ORDER — IPRATROPIUM-ALBUTEROL 0.5-2.5 (3) MG/3ML IN SOLN
3.0000 mL | Freq: Three times a day (TID) | RESPIRATORY_TRACT | Status: DC
  Administered 2020-10-01 – 2020-10-02 (×2): 3 mL via RESPIRATORY_TRACT
  Filled 2020-10-01 (×3): qty 3

## 2020-10-01 MED ORDER — LEVOFLOXACIN 250 MG PO TABS
250.0000 mg | ORAL_TABLET | Freq: Every evening | ORAL | Status: AC
  Administered 2020-10-01 – 2020-10-03 (×3): 250 mg via ORAL
  Filled 2020-10-01 (×3): qty 1

## 2020-10-01 NOTE — Progress Notes (Signed)
Physical Therapy Treatment Patient Details Name: Cathy Solis MRN: [id number] DOB: [birth year] Today's Date: 10/01/2020    History of Present Illness 79 y.o. female with medical history significant for hypothyroid, hypertension, chronic tobacco use, current tobacco use, COPD, solitary left kidney with chronic hydronephrosis (right nephrectomy in 2008/2009 and minimal information available in Care Everywhere), paroxysmal A. fib, chronic bilateral carotid stenosis stenosis via ultrasound imaging on 11/28/2019, presents to the emergency department for chief concerns of weakness and back pain.     PT Comments    Pt tolerated treatment well today. Pt able to improve overall activity tolerance, balance, assistance levels, and ambulation since last session. Per RN request, pt placed on RA for mobility to determine O2 needs upon DC. Pt able to maintain SpO2 >90% on RA after multiple bouts of STS and steps at EOB, with x2 seated rest breaks. Despite progress, pt continues to be limited with safe and independent functional mobility secondary to weakness, generalized deconditioning, poor safety awareness, and c/o lightheadedness with prolonged mobility. Pt will continue to benefit from skilled acute PT services to address deficits for return to baseline function. Based on pt presentation, will continue to recommend SNF at DC, for improved safety with functional mobility and to decrease caregiver burden prior to return home.    Follow Up Recommendations  SNF     Equipment Recommendations  None recommended by PT    Recommendations for Other Services       Precautions / Restrictions Precautions Precautions: Fall Restrictions Weight Bearing Restrictions: No    Mobility  Bed Mobility Overal bed mobility: Needs Assistance Bed Mobility: Supine to Sit;Sit to Supine     Supine to sit: Supervision;HOB elevated Sit to supine: Supervision;HOB elevated   General bed mobility comments: Supervision for  safety to perform supine<>sit transfer with HOB elevated and increased reliance of BUE on handrail. Pt required increased time/effort to perform mobility, demonstrating labored breathing.  Transfers Overall transfer level: Needs assistance Equipment used: Rolling walker (2 wheeled) Transfers: Sit to/from Stand Sit to Stand: From elevated surface;Min assist         General transfer comment: Min assist to stand from EOB with RW x3 for brief change and self care ADL's. Multimodal cues provided for hand placement and positioning to ensure safety with transfer. Pt unable to follow commands for hand placement, demonstrating BUE support on inferior handle of RW.  Ambulation/Gait Ambulation/Gait assistance: Min guard Gait Distance (Feet): 2 Feet (4 lateral steps at EOB) Assistive device: Rolling walker (2 wheeled)   Gait velocity: decreased   General Gait Details: Min guard for safety to take 4 lateral steps at EOB with RW. Pt demonstrated narrow BOS, RLE genu valgum, decreased step length/foot clearance bil, and forward flexed posture. Pt required max multimodal cues for sequencing and walker negotiation.   Stairs             Wheelchair Mobility    Modified Rankin (Stroke Patients Only)       Balance Overall balance assessment: Needs assistance Sitting-balance support: Bilateral upper extremity supported;Feet supported Sitting balance-Leahy Scale: Fair Sitting balance - Comments: Fair seated balance at EOB with BUE/BLE support. Pt with c/o lightheadedness after prolonged seated balance at EOB.   Standing balance support: Bilateral upper extremity supported;During functional activity Standing balance-Leahy Scale: Poor Standing balance comment: Poor standing balance in RW requiring CGA for safety  Cognition Arousal/Alertness: Awake/alert Behavior During Therapy: WFL for tasks assessed/performed Overall Cognitive Status: Within Functional  Limits for tasks assessed                                        Exercises Other Exercises Other Exercises: Pt able to perform x3 STS from elevated bed height with RW for brief change and self care ADL's. Pt required increased cueing for safety with transfers and min assist for power to stand. Pt then able to take 4 lateral steps with RW towards [facility] for repositioning before return to supine; max multimodal cues provided for sequencing and RW negotiation. Pt SpO2 >90% throughout treatment on RA; pt without c/o SOB/DOE, but did report lightheadedness.    General Comments        Pertinent Vitals/Pain Pain Assessment: No/denies pain    Home Living                      Prior Function            PT Goals (current goals can now be found in the care plan section) Acute Rehab PT Goals Patient Stated Goal: pt hopes to improve enough to go home PT Goal Formulation: With patient Time For Goal Achievement: 10/13/20 Potential to Achieve Goals: Fair Progress towards PT goals: Progressing toward goals    Frequency    Min 2X/week      PT Plan Current plan remains appropriate    Co-evaluation              AM-PAC PT "6 Clicks" Mobility   Outcome Measure  Help needed turning from your back to your side while in a flat bed without using bedrails?: A Little Help needed moving from lying on your back to sitting on the side of a flat bed without using bedrails?: A Little Help needed moving to and from a bed to a chair (including a wheelchair)?: A Little Help needed standing up from a chair using your arms (e.g., wheelchair or bedside chair)?: A Little Help needed to walk in hospital room?: A Lot Help needed climbing 3-5 steps with a railing? : Total 6 Click Score: 15    End of Session Equipment Utilized During Treatment: Gait belt Activity Tolerance: Patient limited by fatigue Patient left: with bed alarm set;with call bell/phone within reach;in bed;Other  (comment) (left in care of respiratory therapy) Nurse Communication: Mobility status PT Visit Diagnosis: Muscle weakness (generalized) (M62.81);Difficulty in walking, not elsewhere classified (R26.2);Unsteadiness on feet (R26.81)     Time: 1856-3149 PT Time Calculation (min) (ACUTE ONLY): 23 min  Charges:  $Therapeutic Activity: 23-37 mins                     Herminio Commons, PT, DPT 3:08 PM,10/01/20

## 2020-10-01 NOTE — Progress Notes (Signed)
Progress Note    Cathy Solis  FVC:[id number] DOB: 12-Jul-1941  DOA: 09/27/2020 PCP: Valerie Roys, DO      Brief Narrative:    Medical records reviewed and are as summarized below:  Cathy Solis is a 79 y.o. female       Assessment/Plan:   Principal Problem:   Acute pyelonephritis Active Problems:   Carotid stenosis   Tobacco abuse   Hypertension   COPD (chronic obstructive pulmonary disease) (HCC)   RBBB   Chronic kidney disease (CKD), stage III (moderate) (HCC)   UTI (urinary tract infection)   Unintentional weight loss   Debility   Atrial fibrillation (HCC)    Body mass index is 25.39 kg/m.    Acute pyelonephritis, cystitis: Continue Levaquin.  AKI on CKD stage IIIa, metabolic acidosis, left solitary kidney, chronic UPJ obstruction and hydronephrosis: Patient was evaluated by the urologist and outpatient follow-up was recommended.  Bicarbonate level is improving.  Continue oral sodium bicarbonate.  Paroxysmal atrial fibrillation: Continue metoprolol and Eliquis.  COPD: Continue bronchodilators.  Acute hypoxemic respiratory failure: Resolved.  Oxygen saturation on room air at rest was 97% and 92% while ambulating on room air.  Generalized weakness: PT and OT recommended discharge to SNF for rehabilitation.  Initially, [name] (her sister), said she was without to go home with the patient was refusing to go to SNF because of previous bad experiences.  However, she changed her mind and said that there was no way she could take the patient home in that state.  She wanted patient to go to SNF and patient's son was also in agreement with discharge to SNF.  Follow-up with social worker to assist with disposition.  Other comorbidities include hypertension, chronic bilateral carotid artery stenosis, hypothyroidism.   Diet Order            Diet Heart Room service appropriate? Yes; Fluid consistency: Thin  Diet effective now                     Consultants:  Cardiologist  Urologist:  Procedures:  None    Medications:   . amLODipine  10 mg Oral Daily  . apixaban  2.5 mg Oral BID  . famotidine  10 mg Oral Daily  . fluticasone furoate-vilanterol  1 puff Inhalation Daily  . ipratropium-albuterol  3 mL Nebulization Q6H  . levofloxacin  250 mg Oral QPM  . levothyroxine  150 mcg Oral Daily  . lidocaine  1 patch Transdermal Q24H  . metoprolol tartrate  50 mg Oral BID  . nicotine  21 mg Transdermal Daily  . sodium bicarbonate  650 mg Oral TID   Continuous Infusions:   Anti-infectives (From admission, onward)   Start     Dose/Rate Route Frequency Ordered Stop   10/01/20 1800  levofloxacin (LEVAQUIN) tablet 250 mg  Status:  Discontinued        250 mg Oral Every evening 09/30/20 1710 10/01/20 1537   10/01/20 1800  levofloxacin (LEVAQUIN) tablet 250 mg        250 mg Oral Every evening 10/01/20 1537 10/04/20 1759   09/30/20 1845  levofloxacin (LEVAQUIN) tablet 750 mg        750 mg Oral  Once 09/30/20 1748 09/30/20 2032   09/30/20 1800  levofloxacin (LEVAQUIN) tablet 500 mg  Status:  Discontinued        500 mg Oral  Once 09/30/20 1710 09/30/20 1748   09/29/20 1200  cefdinir (OMNICEF)  capsule 300 mg  Status:  Discontinued        300 mg Oral Daily 09/29/20 0945 09/30/20 1710   09/28/20 1000  cefTRIAXone (ROCEPHIN) 1 g in sodium chloride 0.9 % 100 mL IVPB  Status:  Discontinued        1 g 200 mL/hr over 30 Minutes Intravenous Every 24 hours 09/27/20 1333 09/29/20 0945   09/27/20 1115  cefTRIAXone (ROCEPHIN) 2 g in sodium chloride 0.9 % 100 mL IVPB        2 g 200 mL/hr over 30 Minutes Intravenous  Once 09/27/20 1111 09/27/20 1154             Family Communication/Anticipated D/C date and plan/Code Status   DVT prophylaxis: apixaban (ELIQUIS) tablet 2.5 mg Start: 09/29/20 1145 Place TED hose Start: 09/27/20 1326 apixaban (ELIQUIS) tablet 2.5 mg     Code Status: Full Code  Family Communication: Plan  discussed with her sister, [name], at the bedside. Disposition Plan:    Status is: Inpatient  Remains inpatient appropriate because:Unsafe d/c plan   Dispo: The patient is from: Home              Anticipated d/c is to: SNF              Anticipated d/c date is: 2 days              Patient currently is medically stable to d/c.           Subjective:   Interval events noted.  Her breathing is better though she still has a cough.  No chest pain.  She feels like she has more energy today.  [name], her sister, was at the bedside during this encounter.  Objective:    Vitals:   10/01/20 1232 10/01/20 1430 10/01/20 1453 10/01/20 1454  BP: 129/61     Pulse: 72  84   Resp: 20     Temp: 98.3 F (36.8 C)     TempSrc: Oral     SpO2: 96% 97% 92% 92%  Weight:      Height:       No data found.   Intake/Output Summary (Last 24 hours) at 10/01/2020 1542 Last data filed at 10/01/2020 1514 Gross per 24 hour  Intake 240 ml  Output 1000 ml  Net -760 ml   Filed Weights   09/27/20 0848  Weight: 59 kg    Exam:   GEN: NAD SKIN: No rash EYES: EOMI ENT: MMM CV: RRR PULM: CTA B ABD: soft, ND, NT, +BS CNS: AAO x 3, non focal EXT: No edema or tenderness    Data Reviewed:   I have personally reviewed following labs and imaging studies:  Labs: Labs show the following:   Basic Metabolic Panel: Recent Labs  Lab 09/27/20 0856 09/27/20 0856 09/28/20 0415 09/28/20 0415 09/30/20 0900 10/01/20 0819  NA 134*  --  137  --  136 132*  K 4.7   < > 4.1   < > 3.5 3.7  CL 100  --  110  --  109 105  CO2 19*  --  17*  --  15* 19*  GLUCOSE 139*  --  104*  --  99 105*  BUN 82*  --  70*  --  37* 31*  CREATININE 2.12*  --  1.82*  --  1.30* 1.48*  CALCIUM 9.4  --  8.8*  --  8.7* 8.7*  MG  --   --   --   --   --  1.8  PHOS  --   --   --   --   --  2.4*   < > = values in this interval not displayed.   GFR Estimated Creatinine Clearance: 24 mL/min (A) (by C-G formula based on  SCr of 1.48 mg/dL (H)). Liver Function Tests: No results for input(s): AST, ALT, ALKPHOS, BILITOT, PROT, ALBUMIN in the last 168 hours. No results for input(s): LIPASE, AMYLASE in the last 168 hours. No results for input(s): AMMONIA in the last 168 hours. Coagulation profile No results for input(s): INR, PROTIME in the last 168 hours.  CBC: Recent Labs  Lab 09/27/20 0856 09/28/20 0415 09/30/20 0853 09/30/20 0940  WBC 16.4* 13.1* ORMOD 9.6  NEUTROABS  --   --  PENDING 7.8*  HGB 13.0 12.6 ORMOD 12.5  HCT 38.9 38.3 ORMOD 37.5  MCV 91.5 93.6 ORMOD 90.6  PLT 353 318 ORMOD 322   Cardiac Enzymes: No results for input(s): CKTOTAL, CKMB, CKMBINDEX, TROPONINI in the last 168 hours. BNP (last 3 results) No results for input(s): PROBNP in the last 8760 hours. CBG: No results for input(s): GLUCAP in the last 168 hours. D-Dimer: No results for input(s): DDIMER in the last 72 hours. Hgb A1c: No results for input(s): HGBA1C in the last 72 hours. Lipid Profile: No results for input(s): CHOL, HDL, LDLCALC, TRIG, CHOLHDL, LDLDIRECT in the last 72 hours. Thyroid function studies: No results for input(s): TSH, T4TOTAL, T3FREE, THYROIDAB in the last 72 hours.  Invalid input(s): FREET3 Anemia work up: No results for input(s): VITAMINB12, FOLATE, FERRITIN, TIBC, IRON, RETICCTPCT in the last 72 hours. Sepsis Labs: Recent Labs  Lab 09/27/20 0856 09/27/20 0941 09/27/20 1115 09/28/20 0415 09/30/20 0853 09/30/20 0940  WBC 16.4*  --   --  13.1* ORMOD 9.6  LATICACIDVEN  --  1.5 1.5  --   --   --     Microbiology Recent Results (from the past 240 hour(s))  Blood culture (routine x 2)     Status: None (Preliminary result)   Collection Time: 09/27/20  9:41 AM   Specimen: BLOOD  Result Value Ref Range Status   Specimen Description BLOOD LEFT ANTECUBITAL  Final   Special Requests   Final    BOTTLES DRAWN AEROBIC AND ANAEROBIC Blood Culture adequate volume   Culture   Final    NO GROWTH 4  DAYS Performed at [facility], [address] [place], [place] [address]    Report Status PENDING  Incomplete  Urine culture     Status: Abnormal   Collection Time: 09/27/20 10:37 AM   Specimen: Urine, Random  Result Value Ref Range Status   Specimen Description   Final    URINE, RANDOM Performed at [facility], [address], [place], [place] [address]    Special Requests   Final    NONE Performed at [facility], [address] [place], [place] [address]    Culture 80,000 COLONIES/mL ESCHERICHIA COLI (A)  Final   Report Status 09/29/2020 FINAL  Final   Organism ID, Bacteria ESCHERICHIA COLI (A)  Final      Susceptibility   Escherichia coli - MIC*    AMPICILLIN <=2 SENSITIVE Sensitive     CEFAZOLIN <=4 SENSITIVE Sensitive     CEFEPIME <=0.12 SENSITIVE Sensitive     CEFTRIAXONE <=0.25 SENSITIVE Sensitive     CIPROFLOXACIN <=0.25 SENSITIVE Sensitive     GENTAMICIN <=1 SENSITIVE Sensitive     IMIPENEM <=0.25 SENSITIVE Sensitive  NITROFURANTOIN <=16 SENSITIVE Sensitive     TRIMETH/SULFA <=20 SENSITIVE Sensitive     AMPICILLIN/SULBACTAM <=2 SENSITIVE Sensitive     PIP/TAZO <=4 SENSITIVE Sensitive     * 80,000 COLONIES/mL ESCHERICHIA COLI  Blood culture (routine x 2)     Status: None (Preliminary result)   Collection Time: 09/27/20 11:15 AM   Specimen: BLOOD  Result Value Ref Range Status   Specimen Description BLOOD LFA  Final   Special Requests   Final    BOTTLES DRAWN AEROBIC AND ANAEROBIC Blood Culture adequate volume   Culture   Final    NO GROWTH 4 DAYS Performed at [facility], [address] [place], [place] [address]    Report Status PENDING  Incomplete  Respiratory Panel by RT PCR (Flu A&B, Covid) - Nasopharyngeal Swab     Status: None   Collection Time: 09/27/20 12:24 PM   Specimen: Nasopharyngeal Swab  Result Value Ref Range Status   SARS Coronavirus 2 by RT PCR NEGATIVE NEGATIVE Final    Comment:  (NOTE) SARS-CoV-2 target nucleic acids are NOT DETECTED.  The SARS-CoV-2 RNA is generally detectable in upper respiratoy specimens during the acute phase of infection. The lowest concentration of SARS-CoV-2 viral copies this assay can detect is 131 copies/mL. A negative result does not preclude SARS-Cov-2 infection and should not be used as the sole basis for treatment or other patient management decisions. A negative result may occur with  improper specimen collection/handling, submission of specimen other than nasopharyngeal swab, presence of viral mutation(s) within the areas targeted by this assay, and inadequate number of viral copies (<131 copies/mL). A negative result must be combined with clinical observations, patient history, and epidemiological information. The expected result is Negative.  Fact Sheet for Patients:  PinkCheek.be  Fact Sheet for Healthcare Providers:  GravelBags.it  This test is no t yet approved or cleared by the [place] FDA and  has been authorized for detection and/or diagnosis of SARS-CoV-2 by FDA under an Emergency Use Authorization (EUA). This EUA will remain  in effect (meaning this test can be used) for the duration of the COVID-19 declaration under Section 564(b)(1) of the Act, 21 U.S.C. section 360bbb-3(b)(1), unless the authorization is terminated or revoked sooner.     Influenza A by PCR NEGATIVE NEGATIVE Final   Influenza B by PCR NEGATIVE NEGATIVE Final    Comment: (NOTE) The Xpert Xpress SARS-CoV-2/FLU/RSV assay is intended as an aid in  the diagnosis of influenza from Nasopharyngeal swab specimens and  should not be used as a sole basis for treatment. Nasal washings and  aspirates are unacceptable for Xpert Xpress SARS-CoV-2/FLU/RSV  testing.  Fact Sheet for Patients: PinkCheek.be  Fact Sheet for Healthcare  Providers: GravelBags.it  This test is not yet approved or cleared by the [place] FDA and  has been authorized for detection and/or diagnosis of SARS-CoV-2 by  FDA under an Emergency Use Authorization (EUA). This EUA will remain  in effect (meaning this test can be used) for the duration of the  Covid-19 declaration under Section 564(b)(1) of the Act, 21  U.S.C. section 360bbb-3(b)(1), unless the authorization is  terminated or revoked. Performed at [facility], [address], [place], [place] [address]     Procedures and diagnostic studies:  DG Chest [facility] 1 View  Result Date: 09/29/2020 CLINICAL DATA:  Chest pain. EXAM: PORTABLE CHEST 1 VIEW COMPARISON:  January 14, 2019. FINDINGS: The heart size and mediastinal contours are within normal limits. No  pneumothorax is noted. Right basilar atelectasis or infiltrate is noted. Small bilateral pleural effusions are noted. The visualized skeletal structures are unremarkable. IMPRESSION: Right basilar atelectasis or infiltrate is noted. Small bilateral pleural effusions. Followup PA and lateral chest X-ray is recommended in 3-4 weeks following trial of antibiotic therapy to ensure resolution and exclude underlying malignancy. Aortic Atherosclerosis (ICD10-I70.0). Electronically Signed   By: Marijo Conception M.D.   On: 09/29/2020 18:52               LOS: 3 days   Samul Mcinroy  [facility]   Pager on [email or web address]. If 7PM-7AM, please contact night-coverage at [email or web address]     10/01/2020, 3:42 PM

## 2020-10-01 NOTE — Progress Notes (Signed)
Mobility Specialist - Progress Note   10/01/20 1500  Mobility  Activity Refused mobility  Mobility performed by Mobility specialist    Pt declined mobility d/t being upset that she isn't able to go home. Mobility encouraged pt's participation, but pt continued to decline. Pt requested that we attempt session later this evening. Pt was educated on the importance of mobility and was encouraged to continue performing exercises in between sessions. Pt also requesting medication for cough at this time. Nurse was notified.    Kathee Delton Mobility Specialist 10/01/20, 3:38 PM

## 2020-10-01 NOTE — Care Management Important Message (Signed)
Important Message  Patient Details  Name: Cathy Solis MRN: [id number] Date of Birth: Dec 28, 1940   Medicare Important Message Given:  Yes     [name] 10/01/2020, 12:35 PM

## 2020-10-01 NOTE — Progress Notes (Signed)
   10/01/20 0839  Assess: MEWS Score  Temp 98.2 F (36.8 C)  BP 140/73  Pulse Rate 99  Resp (!) 28  Level of Consciousness Alert  SpO2 97 %  O2 Device Nasal Cannula  O2 Flow Rate (L/min) 2 L/min  Assess: MEWS Score  MEWS Temp 0  MEWS Systolic 0  MEWS Pulse 0  MEWS RR 2  MEWS LOC 0  MEWS Score 2  MEWS Score Color Yellow  Assess: if the MEWS score is Yellow or Red  Were vital signs taken at a resting state? Yes  Focused Assessment No change from prior assessment  Early Detection of Sepsis Score *See Row Information* High  MEWS guidelines implemented *See Row Information* Yes  Treat  Pain Scale 0-10  Pain Score 0  Take Vital Signs  Increase Vital Sign Frequency  Yellow: Q 2hr X 2 then Q 4hr X 2, if remains yellow, continue Q 4hrs  Escalate  MEWS: Escalate Yellow: discuss with charge nurse/RN and consider discussing with provider and RRT  Notify: Charge Nurse/RN  Name of Charge Nurse/RN Notified Jordan Hawks, RN  Date Charge Nurse/RN Notified 10/01/20  Time Charge Nurse/RN Notified 0900  Document  Patient Outcome Other (Comment) (Will continue to monitor outcome. Too early to tell)  Progress note created (see row info) Yes   Will continue to Monitor

## 2020-10-01 NOTE — Progress Notes (Signed)
The pt scored a 7 on the RT protocol assessment. The nebulizer treatments are changed from Q6 to TID.

## 2020-10-01 NOTE — Progress Notes (Signed)
SATURATION QUALIFICATIONS:   Patient Saturations on Room Air at Rest = 97%  Patient Saturations on Room Air while Ambulating = 92%

## 2020-10-01 NOTE — Progress Notes (Signed)
Secure chat with Dr Mal Misty re PIV, replies ok to leave PIV out for now.  Receiving no medication or IV fluids via IV at this time.   [place] notified via telephone call.

## 2020-10-02 DIAGNOSIS — N10 Acute tubulo-interstitial nephritis: Secondary | ICD-10-CM | POA: Diagnosis not present

## 2020-10-02 LAB — CULTURE, BLOOD (ROUTINE X 2)
Culture: NO GROWTH
Culture: NO GROWTH
Special Requests: ADEQUATE
Special Requests: ADEQUATE

## 2020-10-02 LAB — BASIC METABOLIC PANEL
Anion gap: 8 (ref 5–15)
BUN: 28 mg/dL — ABNORMAL HIGH (ref 8–23)
CO2: 22 mmol/L (ref 22–32)
Calcium: 8.6 mg/dL — ABNORMAL LOW (ref 8.9–10.3)
Chloride: 102 mmol/L (ref 98–111)
Creatinine, Ser: 1.35 mg/dL — ABNORMAL HIGH (ref 0.44–1.00)
GFR, Estimated: 39 mL/min — ABNORMAL LOW (ref >60)
Glucose, Bld: 104 mg/dL — ABNORMAL HIGH (ref 70–99)
Potassium: 3.6 mmol/L (ref 3.5–5.1)
Sodium: 132 mmol/L — ABNORMAL LOW (ref 135–145)

## 2020-10-02 MED ORDER — ONDANSETRON HCL 4 MG/2ML IJ SOLN
4.0000 mg | Freq: Four times a day (QID) | INTRAMUSCULAR | Status: DC | PRN
  Filled 2020-10-02: qty 2

## 2020-10-02 MED ORDER — IPRATROPIUM-ALBUTEROL 0.5-2.5 (3) MG/3ML IN SOLN
3.0000 mL | Freq: Two times a day (BID) | RESPIRATORY_TRACT | Status: DC
  Administered 2020-10-03 – 2020-10-05 (×5): 3 mL via RESPIRATORY_TRACT
  Filled 2020-10-02 (×5): qty 3

## 2020-10-02 MED ORDER — ONDANSETRON HCL 4 MG PO TABS
4.0000 mg | ORAL_TABLET | Freq: Three times a day (TID) | ORAL | Status: DC | PRN
  Administered 2020-10-02: 4 mg via ORAL
  Filled 2020-10-02: qty 1

## 2020-10-02 MED ORDER — ALUM & MAG HYDROXIDE-SIMETH 200-200-20 MG/5ML PO SUSP
30.0000 mL | Freq: Four times a day (QID) | ORAL | Status: DC | PRN
  Administered 2020-10-02: 30 mL via ORAL
  Filled 2020-10-02: qty 30

## 2020-10-02 MED ORDER — LEVOTHYROXINE SODIUM 150 MCG PO TABS
150.0000 ug | ORAL_TABLET | Freq: Every day | ORAL | Status: DC
  Administered 2020-10-03 – 2020-10-05 (×3): 150 ug via ORAL
  Filled 2020-10-02 (×2): qty 3
  Filled 2020-10-02 (×4): qty 1
  Filled 2020-10-02: qty 3

## 2020-10-02 MED ORDER — BISACODYL 5 MG PO TBEC: 5.0000 mg | DELAYED_RELEASE_TABLET | Freq: Every day | ORAL | Status: DC | PRN

## 2020-10-02 NOTE — Progress Notes (Signed)
Mobility Specialist - Progress Note   10/02/20 1600  Mobility  Activity Sat and stood x 3  Range of Motion/Exercises Right leg;Left leg (ankle pumps, straight leg raises, standing march)  Information systems manager Ambulated (ft) 0 ft  Mobility Response Tolerated well  Mobility performed by Mobility specialist  $Mobility charge 1 Mobility    Pt was lying in bed upon arrival with son present in room. Pt appears agitated and unpleasant throughout session, but agrees to participate with encouragement from son. Pt gets to EOB with minA. Pt performs seated exercises: straight leg raises and ankle pumps. Noticed pt trying to rush through exercises and was instructed to use slow and controlled motions during performance. Pt performed STSx3 progressing to march on 3rd attempt. Noted pt's bed pads soiled and was changed as pt is in upright position, son assisted. Further mobility limited d/t pt no longer encouraged to participate. Mobility repositioned pt to [facility] with son's assistance. Overall, pt tolerated well. Pt was left in bed with all needs in reach and alarm set.    Kathee Delton Mobility Specialist 10/02/20, 4:54 PM

## 2020-10-02 NOTE — TOC Progression Note (Signed)
Transition of Care [facility]) - Progression Note    Patient Details  Name: Cathy Solis MRN: [id number] Date of Birth: 05-26-1941  Transition of Care [facility]) CM/SW [place], LCSW Phone Number: 10/02/2020, 11:34 AM  Clinical Narrative:    CSW called [facility] to confirm insurance Josem Kaufmann is pending and patient has bed. CSW requested PT see pt again today and encourage her to participate in therapy.     Expected Discharge Plan: [place] Barriers to Discharge: Continued Medical Work up  Expected Discharge Plan and Services Expected Discharge Plan: [place] arrangements for the past 2 months: Single Family Home                                       Social Determinants of Health (SDOH) Interventions    Readmission Risk Interventions No flowsheet data found.

## 2020-10-02 NOTE — Progress Notes (Addendum)
Progress Note    [name]  Cathy Solis:[id number] DOB: 06/10/1941  DOA: 09/27/2020 PCP: Valerie Roys, DO      Brief Narrative:    Medical records reviewed and are as summarized below:  Cathy Solis is a 79 y.o. 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  She presented to the hospital because of generalized weakness and back pain of about 3 days duration.  She was found to have acute pyelonephritis and AKI.  She was treated with empiric IV antibiotics, IV fluids and analgesics.  Urine culture showed E. coli and she was switched to oral Levaquin.  She was evaluated by the urologist who recommended outpatient follow-up.  She was evaluated by PT and OT who recommended further rehabilitation at a skilled nursing facility.  She has COPD and she may require home oxygen at discharge.  Assessment/Plan:   Principal Problem:   Acute pyelonephritis Active Problems:   Carotid stenosis   Tobacco abuse   Hypertension   COPD (chronic obstructive pulmonary disease) (HCC)   RBBB   Chronic kidney disease (CKD), stage III (moderate) (HCC)   UTI (urinary tract infection)   Unintentional weight loss   Debility   Atrial fibrillation (HCC)    Body mass index is 25.39 kg/m.    Acute E. coli pyelonephritis, cystitis: Continue Levaquin  AKI on CKD stage IIIb, metabolic acidosis, left solitary kidney, chronic left UPJ obstruction and severe left hydronephrosis: Bicarb level has normalized.  Discontinue sodium bicarbonate tablets.  Follow-up with urologist as an outpatient.  Hyponatremia: Asymptomatic.  Is probably from CKD.  Paroxysmal atrial fibrillation: Continue metoprolol and Eliquis  COPD: Continue bronchodilators.  She likely has  chronic hypoxemic respiratory failure: Patient's oxygen saturation dropped to 87-88% on room when she worked with PT today.  Taper off oxygen as able.  Generalized weakness: PT and OT recommended discharge to SNF.  Follow-up with social worker to assist with disposition.  Other comorbidities include hypertension, chronic bilateral carotid artery stenosis, hypothyroidism.  Plan of care was discussed with the patient and her sister, Cathy Solis, at the bedside.   Diet Order            Diet Heart Room service appropriate? Yes; Fluid consistency: Thin  Diet effective now                    Consultants:  Cardiologist  Urologist:  Procedures:  None    Medications:   . amLODipine  10 mg Oral Daily  . apixaban  2.5 mg Oral BID  . famotidine  10 mg Oral Daily  . fluticasone furoate-vilanterol  1 puff Inhalation Daily  . ipratropium-albuterol  3 mL Nebulization TID  . levofloxacin  250 mg Oral QPM  . levothyroxine  150 mcg Oral Daily  . lidocaine  1 patch Transdermal Q24H  . metoprolol tartrate  50 mg Oral BID  . nicotine  21 mg Transdermal Daily   Continuous Infusions:   Anti-infectives (From admission, onward)   Start     Dose/Rate Route Frequency Ordered Stop   10/01/20 1800  levofloxacin (LEVAQUIN) tablet 250 mg  Status:  Discontinued        250 mg Oral Every evening 09/30/20 1710 10/01/20 1537   10/01/20 1800  levofloxacin (LEVAQUIN) tablet 250 mg  250 mg Oral Every evening 10/01/20 1537 10/04/20 1759   09/30/20 1845  levofloxacin (LEVAQUIN) tablet 750 mg        750 mg Oral  Once 09/30/20 1748 09/30/20 2032   09/30/20 1800  levofloxacin (LEVAQUIN) tablet 500 mg  Status:  Discontinued        500 mg Oral  Once 09/30/20 1710 09/30/20 1748   09/29/20 1200  cefdinir (OMNICEF) capsule 300 mg  Status:  Discontinued        300 mg Oral Daily 09/29/20 0945 09/30/20 1710   09/28/20 1000  cefTRIAXone (ROCEPHIN) 1 g in sodium chloride 0.9 % 100 mL IVPB  Status:   Discontinued        1 g 200 mL/hr over 30 Minutes Intravenous Every 24 hours 09/27/20 1333 09/29/20 0945   09/27/20 1115  cefTRIAXone (ROCEPHIN) 2 g in sodium chloride 0.9 % 100 mL IVPB        2 g 200 mL/hr over 30 Minutes Intravenous  Once 09/27/20 1111 09/27/20 1154             Family Communication/Anticipated D/C date and plan/Code Status   DVT prophylaxis: apixaban (ELIQUIS) tablet 2.5 mg Start: 09/29/20 1145 Place TED hose Start: 09/27/20 1326 apixaban (ELIQUIS) tablet 2.5 mg     Code Status: Full Code  Family Communication: Plan discussed with her sister, Cathy Solis, at the bedside. Disposition Plan:    Status is: Inpatient  Remains inpatient appropriate because:Unsafe d/c plan   Dispo: The patient is from: Home              Anticipated d/c is to: SNF              Anticipated d/c date is: 2 days              Patient currently is medically stable to d/c.           Subjective:   Interval events noted.  Patient said " I'm trouble because I have a bad attitude". Her breathing is better.  She has no pain.  Her sister, Cathy Solis, is at the bedside.   Objective:    Vitals:   10/02/20 0200 10/02/20 0432 10/02/20 0831 10/02/20 0941  BP: 138/72 135/60  130/76  Pulse: 87 85  93  Resp: 20 20  (!) 22  Temp: 97.6 F (36.4 C) (!) 97.4 F (36.3 C)  97.7 F (36.5 C)  TempSrc: Oral Oral  Oral  SpO2: 97% 93% 93% 90%  Weight:      Height:       No data found.   Intake/Output Summary (Last 24 hours) at 10/02/2020 1025 Last data filed at 10/02/2020 0458 Gross per 24 hour  Intake --  Output 700 ml  Net -700 ml   Filed Weights   09/27/20 0848  Weight: 59 kg    Exam:  GEN: NAD SKIN: Warm and dry EYES: No pallor or icterus ENT: MMM CV: RRR PULM: CTA B ABD: soft, ND, NT, +BS CNS: AAO x 3, non focal EXT: No edema or tenderness    Data Reviewed:   I have personally reviewed following labs and imaging studies:  Labs: Labs show the following:    Basic Metabolic Panel: Recent Labs  Lab 09/27/20 0856 09/27/20 0856 09/28/20 0415 09/28/20 0415 09/30/20 0900 09/30/20 0900 10/01/20 0819 10/02/20 0430  NA 134*  --  137  --  136  --  132* 132*  K 4.7   < > 4.1   < >  3.5   < > 3.7 3.6  CL 100  --  110  --  109  --  105 102  CO2 19*  --  17*  --  15*  --  19* 22  GLUCOSE 139*  --  104*  --  99  --  105* 104*  BUN 82*  --  70*  --  37*  --  31* 28*  CREATININE 2.12*  --  1.82*  --  1.30*  --  1.48* 1.35*  CALCIUM 9.4  --  8.8*  --  8.7*  --  8.7* 8.6*  MG  --   --   --   --   --   --  1.8  --   PHOS  --   --   --   --   --   --  2.4*  --    < > = values in this interval not displayed.   GFR Estimated Creatinine Clearance: 26.3 mL/min (A) (by C-G formula based on SCr of 1.35 mg/dL (H)). Liver Function Tests: No results for input(s): AST, ALT, ALKPHOS, BILITOT, PROT, ALBUMIN in the last 168 hours. No results for input(s): LIPASE, AMYLASE in the last 168 hours. No results for input(s): AMMONIA in the last 168 hours. Coagulation profile No results for input(s): INR, PROTIME in the last 168 hours.  CBC: Recent Labs  Lab 09/27/20 0856 09/28/20 0415 09/30/20 0853 09/30/20 0940  WBC 16.4* 13.1* ORMOD 9.6  NEUTROABS  --   --  PENDING 7.8*  HGB 13.0 12.6 ORMOD 12.5  HCT 38.9 38.3 ORMOD 37.5  MCV 91.5 93.6 ORMOD 90.6  PLT 353 318 ORMOD 322   Cardiac Enzymes: No results for input(s): CKTOTAL, CKMB, CKMBINDEX, TROPONINI in the last 168 hours. BNP (last 3 results) No results for input(s): PROBNP in the last 8760 hours. CBG: No results for input(s): GLUCAP in the last 168 hours. D-Dimer: No results for input(s): DDIMER in the last 72 hours. Hgb A1c: No results for input(s): HGBA1C in the last 72 hours. Lipid Profile: No results for input(s): CHOL, HDL, LDLCALC, TRIG, CHOLHDL, LDLDIRECT in the last 72 hours. Thyroid function studies: No results for input(s): TSH, T4TOTAL, T3FREE, THYROIDAB in the last 72 hours.  Invalid  input(s): FREET3 Anemia work up: No results for input(s): VITAMINB12, FOLATE, FERRITIN, TIBC, IRON, RETICCTPCT in the last 72 hours. Sepsis Labs: Recent Labs  Lab 09/27/20 0856 09/27/20 0941 09/27/20 1115 09/28/20 0415 09/30/20 0853 09/30/20 0940  WBC 16.4*  --   --  13.1* ORMOD 9.6  LATICACIDVEN  --  1.5 1.5  --   --   --     Microbiology Recent Results (from the past 240 hour(s))  Blood culture (routine x 2)     Status: None   Collection Time: 09/27/20  9:41 AM   Specimen: BLOOD  Result Value Ref Range Status   Specimen Description BLOOD LEFT ANTECUBITAL  Final   Special Requests   Final    BOTTLES DRAWN AEROBIC AND ANAEROBIC Blood Culture adequate volume   Culture   Final    NO GROWTH 5 DAYS Performed at [facility], [address], [place], [place] [address]    Report Status 10/02/2020 FINAL  Final  Urine culture     Status: Abnormal   Collection Time: 09/27/20 10:37 AM   Specimen: Urine, Random  Result Value Ref Range Status   Specimen Description   Final    URINE, RANDOM Performed at [facility]  Lab, [place], [place] [address]    Special Requests   Final    NONE Performed at [facility], [place], [place] [address]    Culture 80,000 COLONIES/mL ESCHERICHIA COLI (A)  Final   Report Status 09/29/2020 FINAL  Final   Organism ID, Bacteria ESCHERICHIA COLI (A)  Final      Susceptibility   Escherichia coli - MIC*    AMPICILLIN <=2 SENSITIVE Sensitive     CEFAZOLIN <=4 SENSITIVE Sensitive     CEFEPIME <=0.12 SENSITIVE Sensitive     CEFTRIAXONE <=0.25 SENSITIVE Sensitive     CIPROFLOXACIN <=0.25 SENSITIVE Sensitive     GENTAMICIN <=1 SENSITIVE Sensitive     IMIPENEM <=0.25 SENSITIVE Sensitive     NITROFURANTOIN <=16 SENSITIVE Sensitive     TRIMETH/SULFA <=20 SENSITIVE Sensitive     AMPICILLIN/SULBACTAM <=2 SENSITIVE Sensitive     PIP/TAZO <=4 SENSITIVE Sensitive     * 80,000 COLONIES/mL ESCHERICHIA  COLI  Blood culture (routine x 2)     Status: None   Collection Time: 09/27/20 11:15 AM   Specimen: BLOOD  Result Value Ref Range Status   Specimen Description BLOOD LFA  Final   Special Requests   Final    BOTTLES DRAWN AEROBIC AND ANAEROBIC Blood Culture adequate volume   Culture   Final    NO GROWTH 5 DAYS Performed at [facility], [address], [place], [place] [address]    Report Status 10/02/2020 FINAL  Final  Respiratory Panel by RT PCR (Flu A&B, Covid) - Nasopharyngeal Swab     Status: None   Collection Time: 09/27/20 12:24 PM   Specimen: Nasopharyngeal Swab  Result Value Ref Range Status   SARS Coronavirus 2 by RT PCR NEGATIVE NEGATIVE Final    Comment: (NOTE) SARS-CoV-2 target nucleic acids are NOT DETECTED.  The SARS-CoV-2 RNA is generally detectable in upper respiratoy specimens during the acute phase of infection. The lowest concentration of SARS-CoV-2 viral copies this assay can detect is 131 copies/mL. A negative result does not preclude SARS-Cov-2 infection and should not be used as the sole basis for treatment or other patient management decisions. A negative result may occur with  improper specimen collection/handling, submission of specimen other than nasopharyngeal swab, presence of viral mutation(s) within the areas targeted by this assay, and inadequate number of viral copies (<131 copies/mL). A negative result must be combined with clinical observations, patient history, and epidemiological information. The expected result is Negative.  Fact Sheet for Patients:  PinkCheek.be  Fact Sheet for Healthcare Providers:  GravelBags.it  This test is no t yet approved or cleared by the [place] FDA and  has been authorized for detection and/or diagnosis of SARS-CoV-2 by FDA under an Emergency Use Authorization (EUA). This EUA will remain  in effect (meaning this test can be used) for the  duration of the COVID-19 declaration under Section 564(b)(1) of the Act, 21 U.S.C. section 360bbb-3(b)(1), unless the authorization is terminated or revoked sooner.     Influenza A by PCR NEGATIVE NEGATIVE Final   Influenza B by PCR NEGATIVE NEGATIVE Final    Comment: (NOTE) The Xpert Xpress SARS-CoV-2/FLU/RSV assay is intended as an aid in  the diagnosis of influenza from Nasopharyngeal swab specimens and  should not be used as a sole basis for treatment. Nasal washings and  aspirates are unacceptable for Xpert Xpress SARS-CoV-2/FLU/RSV  testing.  Fact Sheet for Patients: PinkCheek.be  Fact Sheet for Healthcare Providers: GravelBags.it  This test is not yet approved or cleared by the [place] and  has been authorized for detection and/or diagnosis of SARS-CoV-2 by  FDA under an Emergency Use Authorization (EUA). This EUA will remain  in effect (meaning this test can be used) for the duration of the  Covid-19 declaration under Section 564(b)(1) of the Act, 21  U.S.C. section 360bbb-3(b)(1), unless the authorization is  terminated or revoked. Performed at [facility], [address] [place], [place] [address]     Procedures and diagnostic studies:  No results found.             LOS: 4 days   Hasel Janish  [facility]   Pager on [email or web address]. If 7PM-7AM, please contact night-coverage at [email or web address]     10/02/2020, 10:25 AM

## 2020-10-02 NOTE — Progress Notes (Signed)
Pt scored a 6 on the RT protocol assessment. The nebulizers are hanged to BID.

## 2020-10-02 NOTE — Progress Notes (Signed)
SATURATION QUALIFICATIONS:   Patient Saturations on Room Air at Rest = 91%  Patient Saturations on Room Air while Ambulating = 87%  Patient Saturations on 2 Liters of oxygen while Ambulating = 93%

## 2020-10-02 NOTE — Progress Notes (Signed)
Physical Therapy Treatment Patient Details Name: Cathy Solis MRN: [id number] DOB: 22-Feb-1941 Today's Date: 10/02/2020    History of Present Illness 79 y.o. female with medical history significant for hypothyroid, hypertension, chronic tobacco use, current tobacco use, COPD, solitary left kidney with chronic hydronephrosis (right nephrectomy in 2008/2009 and minimal information available in Care Everywhere), paroxysmal A. fib, chronic bilateral carotid stenosis stenosis via ultrasound imaging on 11/28/2019, presents to the emergency department for chief concerns of weakness and back pain.     PT Comments    Pt agreeable to tx but disgruntled throughout session. Pt ambulates with impaired safety awareness re: proper use of AD as pt pushes RW too far out in front of her but requires little physical assist. Pt performs LAQ seated EOB for strengthening. Pt with labored breathing on room air & SpO2 87-88% with little improvement with pursed lip breathing so PT placed pt on 2L/min supplemental oxygen via nasal cannula & SpO2 increased to 93% - nurse made aware.  Pt would benefit from ongoing PT services to address balance deficits & to increase independence with mobility.   Follow Up Recommendations  SNF     Equipment Recommendations  Rolling walker with 5" wheels    Recommendations for Other Services       Precautions / Restrictions Precautions Precautions: Fall Restrictions Weight Bearing Restrictions: No    Mobility  Bed Mobility Overal bed mobility: Needs Assistance Bed Mobility: Supine to Sit;Sit to Supine     Supine to sit: Supervision;HOB elevated Sit to supine: Supervision;HOB elevated      Transfers Overall transfer level: Needs assistance Equipment used: Rolling walker (2 wheeled) Transfers: Sit to/from Stand Sit to Stand: From elevated surface;Min assist         General transfer comment: cuing for safe hand placement for sit<>stand during  transfers  Ambulation/Gait Ambulation/Gait assistance: Min assist Gait Distance (Feet): 30 Feet Assistive device: Rolling walker (2 wheeled) Gait Pattern/deviations: Decreased step length - left;Decreased step length - right;Decreased stride length;Decreased dorsiflexion - right;Decreased dorsiflexion - left Gait velocity: decreased   General Gait Details: Pt ambulates to door & back twice with RW & min assist with very impaired use of RW as pt with significant forward trunk flexion (pt reports she cannot stand upright)   Stairs             Wheelchair Mobility    Modified Rankin (Stroke Patients Only)       Balance Overall balance assessment: Needs assistance Sitting-balance support: Bilateral upper extremity supported;Feet supported Sitting balance-Leahy Scale: Fair Sitting balance - Comments: sits EOB without LOB noted   Standing balance support: Bilateral upper extremity supported;During functional activity Standing balance-Leahy Scale: Poor Standing balance comment: BUE support on RW                            Cognition Arousal/Alertness: Awake/alert Behavior During Therapy: WFL for tasks assessed/performed Overall Cognitive Status: Within Functional Limits for tasks assessed                                 General Comments: pt disgruntled throughout session      Exercises      General Comments  Pt with significant scoliosis, kyphotic posture.       Pertinent Vitals/Pain Pain Assessment: No/denies pain    Home Living  Prior Function            PT Goals (current goals can now be found in the care plan section) Acute Rehab PT Goals Patient Stated Goal: pt hopes to improve enough to go home PT Goal Formulation: With patient Time For Goal Achievement: 10/13/20 Potential to Achieve Goals: Fair Progress towards PT goals: Progressing toward goals    Frequency    Min 2X/week      PT Plan  Current plan remains appropriate    Co-evaluation              AM-PAC PT "6 Clicks" Mobility   Outcome Measure  Help needed turning from your back to your side while in a flat bed without using bedrails?: None Help needed moving from lying on your back to sitting on the side of a flat bed without using bedrails?: None Help needed moving to and from a bed to a chair (including a wheelchair)?: A Little Help needed standing up from a chair using your arms (e.g., wheelchair or bedside chair)?: A Little Help needed to walk in hospital room?: A Little Help needed climbing 3-5 steps with a railing? : A Lot 6 Click Score: 19    End of Session Equipment Utilized During Treatment: Gait belt;Oxygen Activity Tolerance: Patient tolerated treatment well (pt self limiting, refusing further participation) Patient left: with bed alarm set;with call bell/phone within reach;in bed;with family/visitor present Nurse Communication: Mobility status (oxygen) PT Visit Diagnosis: Muscle weakness (generalized) (M62.81);Difficulty in walking, not elsewhere classified (R26.2);Unsteadiness on feet (R26.81)     Time: 9476-5465 PT Time Calculation (min) (ACUTE ONLY): 19 min  Charges:  $Therapeutic Activity: 8-22 mins                     Lavone Nian, PT, DPT 10/02/20, 1:39 PM    Waunita Schooner 10/02/2020, 1:32 PM

## 2020-10-03 DIAGNOSIS — K922 Gastrointestinal hemorrhage, unspecified: Secondary | ICD-10-CM | POA: Diagnosis not present

## 2020-10-03 DIAGNOSIS — K920 Hematemesis: Secondary | ICD-10-CM | POA: Diagnosis not present

## 2020-10-03 DIAGNOSIS — N10 Acute tubulo-interstitial nephritis: Secondary | ICD-10-CM | POA: Diagnosis not present

## 2020-10-03 LAB — CBC
HCT: 35.9 % — ABNORMAL LOW (ref 36.0–46.0)
HCT: 38.8 % (ref 36.0–46.0)
Hemoglobin: 12 g/dL (ref 12.0–15.0)
Hemoglobin: 13.1 g/dL (ref 12.0–15.0)
MCH: 30.2 pg (ref 26.0–34.0)
MCH: 30.3 pg (ref 26.0–34.0)
MCHC: 33.4 g/dL (ref 30.0–36.0)
MCHC: 33.8 g/dL (ref 30.0–36.0)
MCV: 90.4 fL (ref 80.0–100.0)
MCV: 89.8 fL (ref 80.0–100.0)
Platelets: 382 10*3/uL (ref 150–400)
Platelets: 416 10*3/uL — ABNORMAL HIGH (ref 150–400)
RBC: 3.97 MIL/uL (ref 3.87–5.11)
RBC: 4.32 MIL/uL (ref 3.87–5.11)
RDW: 13.1 % (ref 11.5–15.5)
RDW: 13.1 % (ref 11.5–15.5)
WBC: 9.8 10*3/uL (ref 4.0–10.5)
WBC: 8.6 10*3/uL (ref 4.0–10.5)
nRBC: 0 % (ref 0.0–0.2)
nRBC: 0 % (ref 0.0–0.2)

## 2020-10-03 LAB — BASIC METABOLIC PANEL
Anion gap: 12 (ref 5–15)
BUN: 36 mg/dL — ABNORMAL HIGH (ref 8–23)
CO2: 20 mmol/L — ABNORMAL LOW (ref 22–32)
Calcium: 8.6 mg/dL — ABNORMAL LOW (ref 8.9–10.3)
Chloride: 99 mmol/L (ref 98–111)
Creatinine, Ser: 1.6 mg/dL — ABNORMAL HIGH (ref 0.44–1.00)
GFR, Estimated: 32 mL/min — ABNORMAL LOW (ref >60)
Glucose, Bld: 150 mg/dL — ABNORMAL HIGH (ref 70–99)
Potassium: 3.5 mmol/L (ref 3.5–5.1)
Sodium: 131 mmol/L — ABNORMAL LOW (ref 135–145)

## 2020-10-03 LAB — SAMPLE TO BLOOD BANK

## 2020-10-03 MED ORDER — PROMETHAZINE HCL 25 MG/ML IJ SOLN
12.5000 mg | Freq: Once | INTRAMUSCULAR | Status: AC
  Administered 2020-10-03: 12.5 mg via INTRAVENOUS
  Filled 2020-10-03: qty 1

## 2020-10-03 MED ORDER — LACTATED RINGERS IV SOLN: INTRAVENOUS | Status: DC

## 2020-10-03 MED ORDER — PROMETHAZINE HCL 25 MG/ML IJ SOLN: 6.2500 mg | Freq: Once | INTRAMUSCULAR | Status: DC

## 2020-10-03 MED ORDER — ONDANSETRON HCL 4 MG/2ML IJ SOLN
4.0000 mg | Freq: Four times a day (QID) | INTRAMUSCULAR | Status: DC | PRN
  Administered 2020-10-04 – 2020-10-05 (×3): 4 mg via INTRAVENOUS
  Filled 2020-10-03 (×3): qty 2

## 2020-10-03 MED ORDER — PANTOPRAZOLE SODIUM 40 MG IV SOLR
40.0000 mg | Freq: Two times a day (BID) | INTRAVENOUS | Status: DC
  Administered 2020-10-03 – 2020-10-04 (×3): 40 mg via INTRAVENOUS
  Filled 2020-10-03 (×3): qty 40

## 2020-10-03 NOTE — Plan of Care (Signed)
Continuing with plan of care. 

## 2020-10-03 NOTE — Progress Notes (Signed)
Patient complained about nausea in beginning of shift along with right lower abdominal pain. NP Ouma put in order for Zofran 4mg  IV but patient did not have IV access at the moment. An order for Zofran PO was given along with Oxycodone 5 mg for upper mid abdominal pain. Right lower abdominal pain subsided. Patient started to spit up multiple episodes coffee brown emesis shortly after. She also had 400 ml of bloody red urine in suction canister. NP Ouma was notified and IV was put in. A CBC. BMP, and sample to blood bank was drawn. NP Ouma will order IV Protonix.

## 2020-10-03 NOTE — Progress Notes (Addendum)
Progress Note    Cathy Solis  LOV:[id number] DOB: 05-11-1941  DOA: 09/27/2020 PCP: Valerie Roys, DO      Brief Narrative:    Medical records reviewed and are as summarized below:  Cathy Solis is a 79 y.o. 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  She presented to the hospital because of generalized weakness and back pain of about 3 days duration.  She was found to have acute pyelonephritis and AKI.  She was treated with empiric IV antibiotics, IV fluids and analgesics.  Urine culture showed E. coli and she was switched to oral Levaquin.  She was evaluated by the urologist who recommended outpatient follow-up.  She was evaluated by PT and OT who recommended further rehabilitation at a skilled nursing facility.  She has COPD and she may require home oxygen at discharge.  Assessment/Plan:   Principal Problem:   Acute pyelonephritis Active Problems:   Carotid stenosis   Tobacco abuse   Hypertension   COPD (chronic obstructive pulmonary disease) (HCC)   RBBB   Chronic kidney disease (CKD), stage III (moderate) (HCC)   UTI (urinary tract infection)   Unintentional weight loss   Debility   Atrial fibrillation (HCC)   Hematemesis with nausea   Upper GI bleed    Body mass index is 25.39 kg/m.    Acute E. coli pyelonephritis, cystitis: Continue Levaquin  AKI on CKD stage IIIb, metabolic acidosis, left solitary kidney, chronic left UPJ obstruction and severe left hydronephrosis: Start IV fluids because creatinine is trending up.  Hematemesis/upper GI bleeding: Consulted gastroenterologist to assist with management.  Eliquis has been held for now.  Continue IV Protonix.  Start IV fluids.  H&H is stable.  Hyponatremia:  Asymptomatic.   Paroxysmal atrial fibrillation: Continue metoprolol.  Hold Eliquis  COPD: Continue bronchodilators.  She likely has chronic hypoxemic respiratory failure: Continue 2 L/min oxygen via nasal cannula and taper off as able.    Generalized weakness: PT and OT recommended discharge to SNF.  Follow-up with social worker to assist with disposition.  Other comorbidities include hypertension, chronic bilateral carotid artery stenosis, hypothyroidism.     Diet Order            Diet Heart Room service appropriate? Yes; Fluid consistency: Thin  Diet effective now                    Consultants:  Cardiologist  Urologist:  Procedures:  None    Medications:   . amLODipine  10 mg Oral Daily  . fluticasone furoate-vilanterol  1 puff Inhalation Daily  . ipratropium-albuterol  3 mL Nebulization BID  . levofloxacin  250 mg Oral QPM  . levothyroxine  150 mcg Oral Q0600  . lidocaine  1 patch Transdermal Q24H  . metoprolol tartrate  50 mg Oral BID  . nicotine  21 mg Transdermal Daily  . pantoprazole (PROTONIX) IV  40 mg Intravenous BID   Continuous Infusions: . lactated ringers 75 mL/hr at 10/03/20 0842     Anti-infectives (From admission, onward)   Start     Dose/Rate Route Frequency Ordered Stop   10/01/20 1800  levofloxacin (LEVAQUIN) tablet 250 mg  Status:  Discontinued        250 mg Oral Every evening 09/30/20 1710 10/01/20 1537   10/01/20  1800  levofloxacin (LEVAQUIN) tablet 250 mg        250 mg Oral Every evening 10/01/20 1537 10/04/20 1759   09/30/20 1845  levofloxacin (LEVAQUIN) tablet 750 mg        750 mg Oral  Once 09/30/20 1748 09/30/20 2032   09/30/20 1800  levofloxacin (LEVAQUIN) tablet 500 mg  Status:  Discontinued        500 mg Oral  Once 09/30/20 1710 09/30/20 1748   09/29/20 1200  cefdinir (OMNICEF) capsule 300 mg  Status:  Discontinued        300 mg Oral Daily 09/29/20 0945 09/30/20 1710   09/28/20 1000  cefTRIAXone (ROCEPHIN) 1 g in  sodium chloride 0.9 % 100 mL IVPB  Status:  Discontinued        1 g 200 mL/hr over 30 Minutes Intravenous Every 24 hours 09/27/20 1333 09/29/20 0945   09/27/20 1115  cefTRIAXone (ROCEPHIN) 2 g in sodium chloride 0.9 % 100 mL IVPB        2 g 200 mL/hr over 30 Minutes Intravenous  Once 09/27/20 1111 09/27/20 1154             Family Communication/Anticipated D/C date and plan/Code Status   DVT prophylaxis: Place TED hose Start: 09/27/20 1326     Code Status: Full Code  Family Communication: Plan discussed with her sister, Patty, at the bedside. Disposition Plan:    Status is: Inpatient  Remains inpatient appropriate because:Unsafe d/c plan   Dispo: The patient is from: Home              Anticipated d/c is to: SNF              Anticipated d/c date is: 2 days              Patient currently is medically stable to d/c.           Subjective:   Interval events noted.  According to chart review, patient had multiple episodes of hematemesis.  Patient said she did not see the color of her vomitus.  Currently, she has no complaints.  No abdominal pain, diarrhea or vomiting.  Breathing is okay.   Objective:    Vitals:   10/03/20 0005 10/03/20 0356 10/03/20 0811 10/03/20 1211  BP: (!) 163/86 120/79 (!) 119/58 138/73  Pulse: 95 95 86 81  Resp: (!) 24  16 16   Temp: 98 F (36.7 C) 98.2 F (36.8 C) 98.2 F (36.8 C)   TempSrc: Oral Oral Oral   SpO2: 94% 94% 94% 98%  Weight:      Height:       No data found.   Intake/Output Summary (Last 24 hours) at 10/03/2020 1404 Last data filed at 10/03/2020 0118 Gross per 24 hour  Intake --  Output 425 ml  Net -425 ml   Filed Weights   09/27/20 0848  Weight: 59 kg    Exam:  GEN: NAD SKIN: No rash EYES: EOMI ENT: MMM CV: RRR PULM: CTA B ABD: soft, ND, NT, +BS CNS: AAO x 3, non focal EXT: No edema or tenderness      Data Reviewed:   I have personally reviewed following labs and imaging  studies:  Labs: Labs show the following:   Basic Metabolic Panel: Recent Labs  Lab 09/28/20 0415 09/28/20 0415 09/30/20 0900 09/30/20 0900 10/01/20 0819 10/01/20 0819 10/02/20 0430 10/03/20 0105  NA 137  --  136  --  132*  --  132*  131*  K 4.1   < > 3.5   < > 3.7   < > 3.6 3.5  CL 110  --  109  --  105  --  102 99  CO2 17*  --  15*  --  19*  --  22 20*  GLUCOSE 104*  --  99  --  105*  --  104* 150*  BUN 70*  --  37*  --  31*  --  28* 36*  CREATININE 1.82*  --  1.30*  --  1.48*  --  1.35* 1.60*  CALCIUM 8.8*  --  8.7*  --  8.7*  --  8.6* 8.6*  MG  --   --   --   --  1.8  --   --   --   PHOS  --   --   --   --  2.4*  --   --   --    < > = values in this interval not displayed.   GFR Estimated Creatinine Clearance: 22.2 mL/min (A) (by C-G formula based on SCr of 1.6 mg/dL (H)). Liver Function Tests: No results for input(s): AST, ALT, ALKPHOS, BILITOT, PROT, ALBUMIN in the last 168 hours. No results for input(s): LIPASE, AMYLASE in the last 168 hours. No results for input(s): AMMONIA in the last 168 hours. Coagulation profile No results for input(s): INR, PROTIME in the last 168 hours.  CBC: Recent Labs  Lab 09/28/20 0415 09/30/20 0853 09/30/20 0940 10/03/20 0105 10/03/20 0342  WBC 13.1* ORMOD 9.6 9.8 8.6  NEUTROABS  --  PENDING 7.8*  --   --   HGB 12.6 ORMOD 12.5 12.0 13.1  HCT 38.3 ORMOD 37.5 35.9* 38.8  MCV 93.6 ORMOD 90.6 90.4 89.8  PLT 318 ORMOD 322 382 416*   Cardiac Enzymes: No results for input(s): CKTOTAL, CKMB, CKMBINDEX, TROPONINI in the last 168 hours. BNP (last 3 results) No results for input(s): PROBNP in the last 8760 hours. CBG: No results for input(s): GLUCAP in the last 168 hours. D-Dimer: No results for input(s): DDIMER in the last 72 hours. Hgb A1c: No results for input(s): HGBA1C in the last 72 hours. Lipid Profile: No results for input(s): CHOL, HDL, LDLCALC, TRIG, CHOLHDL, LDLDIRECT in the last 72 hours. Thyroid function studies: No  results for input(s): TSH, T4TOTAL, T3FREE, THYROIDAB in the last 72 hours.  Invalid input(s): FREET3 Anemia work up: No results for input(s): VITAMINB12, FOLATE, FERRITIN, TIBC, IRON, RETICCTPCT in the last 72 hours. Sepsis Labs: Recent Labs  Lab 09/27/20 0941 09/27/20 1115 09/28/20 0415 09/30/20 0853 09/30/20 0940 10/03/20 0105 10/03/20 0342  WBC  --   --    < > ORMOD 9.6 9.8 8.6  LATICACIDVEN 1.5 1.5  --   --   --   --   --    < > = values in this interval not displayed.    Microbiology Recent Results (from the past 240 hour(s))  Blood culture (routine x 2)     Status: None   Collection Time: 09/27/20  9:41 AM   Specimen: BLOOD  Result Value Ref Range Status   Specimen Description BLOOD LEFT ANTECUBITAL  Final   Special Requests   Final    BOTTLES DRAWN AEROBIC AND ANAEROBIC Blood Culture adequate volume   Culture   Final    NO GROWTH 5 DAYS Performed at [facility], [address], [place], [place] [address]    Report Status 10/02/2020 FINAL  Final  Urine culture     Status: Abnormal   Collection Time: 09/27/20 10:37 AM   Specimen: Urine, Random  Result Value Ref Range Status   Specimen Description   Final    URINE, RANDOM Performed at [facility], [address] [place], [place] [address]    Special Requests   Final    NONE Performed at [facility], [address] [place], [place] [address]    Culture 80,000 COLONIES/mL ESCHERICHIA COLI (A)  Final   Report Status 09/29/2020 FINAL  Final   Organism ID, Bacteria ESCHERICHIA COLI (A)  Final      Susceptibility   Escherichia coli - MIC*    AMPICILLIN <=2 SENSITIVE Sensitive     CEFAZOLIN <=4 SENSITIVE Sensitive     CEFEPIME <=0.12 SENSITIVE Sensitive     CEFTRIAXONE <=0.25 SENSITIVE Sensitive     CIPROFLOXACIN <=0.25 SENSITIVE Sensitive     GENTAMICIN <=1 SENSITIVE Sensitive     IMIPENEM <=0.25 SENSITIVE Sensitive     NITROFURANTOIN <=16 SENSITIVE Sensitive      TRIMETH/SULFA <=20 SENSITIVE Sensitive     AMPICILLIN/SULBACTAM <=2 SENSITIVE Sensitive     PIP/TAZO <=4 SENSITIVE Sensitive     * 80,000 COLONIES/mL ESCHERICHIA COLI  Blood culture (routine x 2)     Status: None   Collection Time: 09/27/20 11:15 AM   Specimen: BLOOD  Result Value Ref Range Status   Specimen Description BLOOD LFA  Final   Special Requests   Final    BOTTLES DRAWN AEROBIC AND ANAEROBIC Blood Culture adequate volume   Culture   Final    NO GROWTH 5 DAYS Performed at [facility], [address] [place], [place] [address]    Report Status 10/02/2020 FINAL  Final  Respiratory Panel by RT PCR (Flu A&B, Covid) - Nasopharyngeal Swab     Status: None   Collection Time: 09/27/20 12:24 PM   Specimen: Nasopharyngeal Swab  Result Value Ref Range Status   SARS Coronavirus 2 by RT PCR NEGATIVE NEGATIVE Final    Comment: (NOTE) SARS-CoV-2 target nucleic acids are NOT DETECTED.  The SARS-CoV-2 RNA is generally detectable in upper respiratoy specimens during the acute phase of infection. The lowest concentration of SARS-CoV-2 viral copies this assay can detect is 131 copies/mL. A negative result does not preclude SARS-Cov-2 infection and should not be used as the sole basis for treatment or other patient management decisions. A negative result may occur with  improper specimen collection/handling, submission of specimen other than nasopharyngeal swab, presence of viral mutation(s) within the areas targeted by this assay, and inadequate number of viral copies (<131 copies/mL). A negative result must be combined with clinical observations, patient history, and epidemiological information. The expected result is Negative.  Fact Sheet for Patients:  PinkCheek.be  Fact Sheet for Healthcare Providers:  GravelBags.it  This test is no t yet approved or cleared by the [place] FDA and  has been authorized for  detection and/or diagnosis of SARS-CoV-2 by FDA under an Emergency Use Authorization (EUA). This EUA will remain  in effect (meaning this test can be used) for the duration of the COVID-19 declaration under Section 564(b)(1) of the Act, 21 U.S.C. section 360bbb-3(b)(1), unless the authorization is terminated or revoked sooner.     Influenza A by PCR NEGATIVE NEGATIVE Final   Influenza B by PCR NEGATIVE NEGATIVE Final    Comment: (NOTE) The Xpert Xpress SARS-CoV-2/FLU/RSV assay is intended as an aid in  the diagnosis of influenza  from Nasopharyngeal swab specimens and  should not be used as a sole basis for treatment. Nasal washings and  aspirates are unacceptable for Xpert Xpress SARS-CoV-2/FLU/RSV  testing.  Fact Sheet for Patients: PinkCheek.be  Fact Sheet for Healthcare Providers: GravelBags.it  This test is not yet approved or cleared by the [place] FDA and  has been authorized for detection and/or diagnosis of SARS-CoV-2 by  FDA under an Emergency Use Authorization (EUA). This EUA will remain  in effect (meaning this test can be used) for the duration of the  Covid-19 declaration under Section 564(b)(1) of the Act, 21  U.S.C. section 360bbb-3(b)(1), unless the authorization is  terminated or revoked. Performed at [facility], [address] [place], [place] [address]     Procedures and diagnostic studies:  No results found.             LOS: 5 days   Shiara Mcgough  [facility]   Pager on [email or web address]. If 7PM-7AM, please contact night-coverage at [email or web address]     10/03/2020, 2:04 PM

## 2020-10-03 NOTE — Progress Notes (Addendum)
    BRIEF OVERNIGHT PROGRESS REPORT  SUBJECTIVE: Notified by primary RN that patient was vomiting coffee ground emesis and also with gross hematuria.   OBJECTIVE: On to the bedside, he was afebrile with blood pressure 163/86 mm Hg and pulse rate 110 beats/min. There were no focal neurological deficits; she was alert and oriented x4, and he did not demonstrate any memory deficits.   ASSESSMENT & PLAN: 79 y.o female with PMH of hypothyroidism, HTN, COPD, CKD stage lllb, solitary left kidney with chroninc hydronephrosis s/p right nephrectomy (2008/2009), and paroxysmal afib admitted with acute pyelonephritis and AKI. Urine culture grew E. coli and which was treated with Ceftriaxone and switched to oral Levaquin. Patient was evaluated by cardiology for atrial fibrillation and started on Eiquis 46/50 now complicated by acute GI bleed and Hematuria.    GI Bleed  - new onset Hematemesis without evidence of hemodynamic instability.  Appears was recent started on Eliquis for afib. - Will obtain STAT CBC - At least 2x IV access, 18 gauge or larger - IVF resuscitation to maintain MAP>65 - H&H monitoring q6h - Blood Consent.  Transfuse PRN Hgb<8 - Start Pantoprazole 40mg  IV BID - NPO  - Hold NSAIDs, steroids, ASA - Hold Eliquis for now pending work up - GI Consult if bleeding persistent   Hematuria - New onset in the setting of anticoagulation use -Hold Eliquis -Monitor H&H as above    Rufina Falco, MSN, DNP, CCRN, FNP-BC  [facility] Nurse Practitioner  [facility]

## 2020-10-03 NOTE — Progress Notes (Signed)
PIV consult: Appears pt is no longer on IV meds. For vein preservation purposes, recommend starting new site when IV med is ordered. Message to RN.

## 2020-10-03 NOTE — Consult Note (Signed)
Lucilla Lame, MD [facility]  [address] [place], [place] [address] Phone: [phone] Fax : [phone]  Consultation  Referring Provider:     Dr. Mal Misty Primary Care Physician:  Valerie Roys, DO Primary Gastroenterologist: [facility]         Reason for Consultation:     Hematemesis  Date of Admission:  09/27/2020 Date of Consultation:  10/03/2020         HPI:   Cathy Solis is a 79 y.o. female who was admitted with a UTI which was treated with antibiotics and she has a history status post right nephrectomy.  The patient has chronic kidney disease with COPD.  The patient had reported new onset of hematemesis yesterday.  The patient also was recently started on Eliquis for atrial fibrillation.  The patient was ordered a stat CBC and her hemoglobin showed:  Component     Latest Ref Rng & Units 09/30/2020 10/03/2020 10/03/2020         9:40 AM  1:05 AM  3:42 AM  Hemoglobin     12.0 - 15.0 g/dL 12.5 12.0 13.1  HCT     36 - 46 % 37.5 35.9 (L) 38.8   The patient reports some abdominal pain in the left side and she states that it hurts where her kidney is.  There is no report of any further nausea vomiting today.  The patient is sitting with her sister who states that she has not seen any signs of discomfort from the patient or hematemesis.  Patient was ordered Zofran and oxycodone for her abdominal pain.  It was reported that overnight the pain subsided.  The upper GI bleeding was reported to be coffee-ground emesis.  She denies any NSAIDs except a 81 mg aspirin she takes daily at home.  Past Medical History:  Diagnosis Date  . Breast cancer [facility]) 2006   Left breast, s/p radiation  . 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 Hypertension   . Kidney problem    Undeveloped R kidney  . 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 Personal history of tobacco use, presenting hazards to health 03/18/2016  . Stroke [facility])    residual left sided weakness   . Toe infection    followed by Dr. Jens Som    Past Surgical History:  Procedure Laterality Date  . ABDOMINAL HYSTERECTOMY    . BLADDER REPAIR    . BREAST LUMPECTOMY Left   . BREAST SURGERY     left  . CAROTID ENDARTERECTOMY  2008   left  . COLONOSCOPY  2008  . EYE SURGERY Right 2013   cataract  . KIDNEY SURGERY  1949    Prior to Admission medications   Medication Sig Start Date End Date Taking? Authorizing Provider  albuterol (VENTOLIN HFA) 108 (90 Base) MCG/ACT inhaler Inhale 2 puffs into the lungs every 6 (six) hours as needed for wheezing or shortness of breath. 07/21/20  Yes Johnson, Megan P, DO  amLODipine (NORVASC) 10 MG tablet Take 1 tablet (10 mg total) by mouth daily. 07/21/20  Yes Johnson, Megan P, DO  aspirin EC 81 MG tablet Take 81 mg by mouth daily. Swallow whole.   Yes [provider]  ipratropium-albuterol (DUONEB) 0.5-2.5 (3) MG/3ML SOLN Take 3 mLs by nebulization 2 (two) times daily for 4 days. DuoNeb nebulizer treatments twice a day for 4 days followed by as needed for severe shortness of breath Patient  taking differently: Take 3 mLs by nebulization 2 (two) times daily.  01/27/20 09/27/20 Yes Johnson, Megan P, DO  levothyroxine (SYNTHROID) 150 MCG tablet Take 1 tablet (150 mcg total) by mouth daily. 09/06/20  Yes Johnson, Megan P, DO  lisinopril (ZESTRIL) 10 MG tablet Take 1 tablet (10 mg total) by mouth daily. TAKE 1 TABLET BY MOUTH EVERY DAY 07/21/20  Yes Johnson, Megan P, DO  metoprolol tartrate (LOPRESSOR) 25 MG tablet Take 1 tablet (25 mg total) by mouth 2 (two) times daily. 07/21/20  Yes Johnson, Megan P, DO    Family History  Problem Relation Age of Onset  . Stroke Mother   . Heart disease Father   . Heart attack Maternal Grandfather      Social History   Tobacco Use  . Smoking status: Current Some Day Smoker    Packs/day: 0.50    Years: 30.00    Pack years: 15.00    Types: Cigarettes    Last attempt to quit: 01/08/2019    Years since quitting:  1.7  . Smokeless tobacco: Never Used  Vaping Use  . Vaping Use: Never used  Substance Use Topics  . Alcohol use: No  . Drug use: No    Allergies as of 09/27/2020 - Review Complete 09/27/2020  Allergen Reaction Noted  . Statins Other (See Comments) 11/02/2017  . Zetia [ezetimibe] Other (See Comments) 12/25/2017    Review of Systems:    All systems reviewed and negative except where noted in HPI.   Physical Exam:  Vital signs in last 24 hours: Temp:  [97.8 F (36.6 C)-98.2 F (36.8 C)] 98.2 F (36.8 C) (11/13 0811) Pulse Rate:  [78-95] 86 (11/13 0811) Resp:  [16-24] 16 (11/13 0811) BP: (118-163)/(58-86) 119/58 (11/13 0811) SpO2:  [94 %-98 %] 94 % (11/13 0811) Last BM Date: 09/27/20 General:   Pleasant, cooperative in NAD Head:  Normocephalic and atraumatic. Eyes:   No icterus.   Conjunctiva pink. PERRLA. Ears:  Normal auditory acuity. Neck:  Supple; no masses or thyroidomegaly Lungs: Respirations even and unlabored. Lungs clear to auscultation bilaterally.   No wheezes, crackles, or rhonchi.  Heart:  Regular rate and rhythm;  Without murmur, clicks, rubs or gallops Abdomen:  Soft, nondistended, nontender. Normal bowel sounds. No appreciable masses or hepatomegaly.  No rebound or guarding.  Rectal:  Not performed. Msk:  Symmetrical without gross deformities.    Extremities:  Without edema, cyanosis or clubbing. Neurologic:  Alert and oriented x3;  grossly normal neurologically. Skin:  Intact without significant lesions or rashes. Cervical Nodes:  No significant cervical adenopathy. Psych:  Alert and cooperative. Normal affect.  LAB RESULTS: Recent Labs    10/03/20 0105 10/03/20 0342  WBC 9.8 8.6  HGB 12.0 13.1  HCT 35.9* 38.8  PLT 382 416*   BMET Recent Labs    10/01/20 0819 10/02/20 0430 10/03/20 0105  NA 132* 132* 131*  K 3.7 3.6 3.5  CL 105 102 99  CO2 19* 22 20*  GLUCOSE 105* 104* 150*  BUN 31* 28* 36*  CREATININE 1.48* 1.35* 1.60*  CALCIUM 8.7*  8.6* 8.6*   LFT No results for input(s): PROT, ALBUMIN, AST, ALT, ALKPHOS, BILITOT, BILIDIR, IBILI in the last 72 hours. PT/INR No results for input(s): LABPROT, INR in the last 72 hours.  STUDIES: No results found.    Impression / Plan:   Assessment: Principal Problem:   Acute pyelonephritis Active Problems:   Carotid stenosis   Tobacco abuse   Hypertension  COPD (chronic obstructive pulmonary disease) (HCC)   RBBB   Chronic kidney disease (CKD), stage III (moderate) (HCC)   UTI (urinary tract infection)   Unintentional weight loss   Debility   Atrial fibrillation (HCC)   Cathy Solis is a 79 y.o. y/o female with reported hematemesis and hemoglobin of 13.1.  The patient has had no further signs of GI bleeding.  The patient started to have her symptoms reported by the nurse after receiving oxycodone for abdominal pain.  The patient is no longer having abdominal pain.  This may represent a Mallory-Weiss tear from her vomiting.  The patient may have her nausea from her other multiple medical issues including her urinary tract infection.  Plan:  The plan for now is to continue to observe the patient with her report of no further abdominal pain, nausea or hematemesis since last night.  The patient's hemoglobin has been stable and so has the patient.  If the patient should have any further signs of bleeding or drop in hemoglobin of significance then the patient was offered an upper endoscopy.  She states that she would be willing to undergo that if necessary.  I will follow up with her tomorrow to see if she has any further symptoms and if she does then depending on how that the symptoms are may proceed with a upper endoscopy if she deteriorates.  The patient and her sister have been explained the plan and agree with it.  Thank you for involving me in the care of this patient.      LOS: 5 days   Lucilla Lame, MD, [facility] 10/03/2020, 10:39 AM,  Pager [phone] 7am-5pm  Check  AMION for 5pm -7am coverage and on weekends   Note: This dictation was prepared with Dragon dictation along with smaller phrase technology. Any transcriptional errors that result from this process are unintentional.

## 2020-10-04 DIAGNOSIS — K920 Hematemesis: Secondary | ICD-10-CM | POA: Diagnosis not present

## 2020-10-04 DIAGNOSIS — N10 Acute tubulo-interstitial nephritis: Secondary | ICD-10-CM | POA: Diagnosis not present

## 2020-10-04 LAB — CBC WITH DIFFERENTIAL/PLATELET
Abs Immature Granulocytes: 0.13 10*3/uL — ABNORMAL HIGH (ref 0.00–0.07)
Basophils Absolute: 0 10*3/uL (ref 0.0–0.1)
Basophils Relative: 0 %
Eosinophils Absolute: 0.1 10*3/uL (ref 0.0–0.5)
Eosinophils Relative: 1 %
HCT: 35.2 % — ABNORMAL LOW (ref 36.0–46.0)
Hemoglobin: 11.5 g/dL — ABNORMAL LOW (ref 12.0–15.0)
Immature Granulocytes: 2 %
Lymphocytes Relative: 14 %
Lymphs Abs: 1.1 10*3/uL (ref 0.7–4.0)
MCH: 30.1 pg (ref 26.0–34.0)
MCHC: 32.7 g/dL (ref 30.0–36.0)
MCV: 92.1 fL (ref 80.0–100.0)
Monocytes Absolute: 0.8 10*3/uL (ref 0.1–1.0)
Monocytes Relative: 10 %
Neutro Abs: 5.6 10*3/uL (ref 1.7–7.7)
Neutrophils Relative %: 73 %
Platelets: 343 10*3/uL (ref 150–400)
RBC: 3.82 MIL/uL — ABNORMAL LOW (ref 3.87–5.11)
RDW: 13.4 % (ref 11.5–15.5)
WBC: 7.7 10*3/uL (ref 4.0–10.5)
nRBC: 0 % (ref 0.0–0.2)

## 2020-10-04 LAB — BASIC METABOLIC PANEL
Anion gap: 8 (ref 5–15)
BUN: 30 mg/dL — ABNORMAL HIGH (ref 8–23)
CO2: 24 mmol/L (ref 22–32)
Calcium: 8.6 mg/dL — ABNORMAL LOW (ref 8.9–10.3)
Chloride: 100 mmol/L (ref 98–111)
Creatinine, Ser: 1.56 mg/dL — ABNORMAL HIGH (ref 0.44–1.00)
GFR, Estimated: 33 mL/min — ABNORMAL LOW (ref >60)
Glucose, Bld: 111 mg/dL — ABNORMAL HIGH (ref 70–99)
Potassium: 3.8 mmol/L (ref 3.5–5.1)
Sodium: 132 mmol/L — ABNORMAL LOW (ref 135–145)

## 2020-10-04 MED ORDER — PANTOPRAZOLE SODIUM 40 MG PO TBEC
40.0000 mg | DELAYED_RELEASE_TABLET | Freq: Two times a day (BID) | ORAL | Status: DC
  Administered 2020-10-04 – 2020-10-05 (×2): 40 mg via ORAL
  Filled 2020-10-04 (×2): qty 1

## 2020-10-04 NOTE — Plan of Care (Signed)
Continuing with plan of care. 

## 2020-10-04 NOTE — Progress Notes (Addendum)
Progress Note    Cathy Solis  UYQ:[id number] DOB: 06-15-1941  DOA: 09/27/2020 PCP: Valerie Roys, DO      Brief Narrative:    Medical records reviewed and are as summarized below:  Cathy Solis is a 79 y.o. 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  She presented to the hospital because of generalized weakness and back pain of about 3 days duration.  She was found to have acute pyelonephritis and AKI.  She was treated with empiric IV antibiotics, IV fluids and analgesics.  Urine culture showed E. coli and she was switched to oral Levaquin.  She was evaluated by the urologist who recommended outpatient follow-up.  She was evaluated by PT and OT who recommended further rehabilitation at a skilled nursing facility.  She has COPD and she may require home oxygen at discharge.  Assessment/Plan:   Principal Problem:   Acute pyelonephritis Active Problems:   Carotid stenosis   Tobacco abuse   Hypertension   COPD (chronic obstructive pulmonary disease) (HCC)   RBBB   Chronic kidney disease (CKD), stage III (moderate) (HCC)   UTI (urinary tract infection)   Unintentional weight loss   Debility   Atrial fibrillation (HCC)   Hematemesis with nausea   Upper GI bleed    Body mass index is 25.39 kg/m.    Acute E. coli pyelonephritis, cystitis: Completed Levaquin  AKI on CKD stage IIIb, metabolic acidosis, left solitary kidney, chronic left UPJ obstruction and severe left hydronephrosis: Creatinine is stable.   Hematemesis/upper GI bleeding: Resolved.  Gastroenterologist has signed off.  Discontinue IV fluids.  Change IV to oral Protonix.  Resume Eliquis tomorrow.    Hyponatremia: Asymptomatic.   Paroxysmal atrial  fibrillation: Continue metoprolol.  Resume Eliquis tomorrow  COPD: Continue bronchodilators.  She likely has chronic hypoxemic respiratory failure: Continue 2 L/min oxygen via nasal cannula and taper off as able.    Generalized weakness: PT and OT recommended discharge to SNF.  Follow-up with social worker to assist with disposition.  Other comorbidities include hypertension, chronic bilateral carotid artery stenosis, hypothyroidism.     Diet Order            Diet Heart Room service appropriate? Yes; Fluid consistency: Thin  Diet effective now                    Consultants:  Cardiologist  Urologist:  Procedures:  None    Medications:   . amLODipine  10 mg Oral Daily  . fluticasone furoate-vilanterol  1 puff Inhalation Daily  . ipratropium-albuterol  3 mL Nebulization BID  . levothyroxine  150 mcg Oral Q0600  . lidocaine  1 patch Transdermal Q24H  . metoprolol tartrate  50 mg Oral BID  . nicotine  21 mg Transdermal Daily  . pantoprazole  40 mg Oral BID   Continuous Infusions:    Anti-infectives (From admission, onward)   Start     Dose/Rate Route Frequency Ordered Stop   10/01/20 1800  levofloxacin (LEVAQUIN) tablet 250 mg  Status:  Discontinued        250 mg Oral Every evening 09/30/20 1710 10/01/20 1537   10/01/20 1800  levofloxacin (LEVAQUIN) tablet 250 mg        250 mg Oral Every evening 10/01/20 1537 10/03/20 1712  09/30/20 1845  levofloxacin (LEVAQUIN) tablet 750 mg        750 mg Oral  Once 09/30/20 1748 09/30/20 2032   09/30/20 1800  levofloxacin (LEVAQUIN) tablet 500 mg  Status:  Discontinued        500 mg Oral  Once 09/30/20 1710 09/30/20 1748   09/29/20 1200  cefdinir (OMNICEF) capsule 300 mg  Status:  Discontinued        300 mg Oral Daily 09/29/20 0945 09/30/20 1710   09/28/20 1000  cefTRIAXone (ROCEPHIN) 1 g in sodium chloride 0.9 % 100 mL IVPB  Status:  Discontinued        1 g 200 mL/hr over 30 Minutes Intravenous Every 24 hours 09/27/20  1333 09/29/20 0945   09/27/20 1115  cefTRIAXone (ROCEPHIN) 2 g in sodium chloride 0.9 % 100 mL IVPB        2 g 200 mL/hr over 30 Minutes Intravenous  Once 09/27/20 1111 09/27/20 1154             Family Communication/Anticipated D/C date and plan/Code Status   DVT prophylaxis: Place TED hose Start: 09/27/20 1326     Code Status: Full Code  Family Communication: Plan discussed with her son at the bedside. Disposition Plan:    Status is: Inpatient  Remains inpatient appropriate because:Unsafe d/c plan   Dispo: The patient is from: Home              Anticipated d/c is to: SNF              Anticipated d/c date is: 2 days              Patient currently is medically stable to d/c.           Subjective:   Interval events noted.  No abdominal pain vomiting or bloody stools.  His son is at the bedside.  [name], RN is at the bedside.   Objective:    Vitals:   10/03/20 1944 10/03/20 2359 10/04/20 0440 10/04/20 1300  BP: 136/79 116/73 133/72 121/66  Pulse: 78 77 77 60  Resp:   16 18  Temp: 97.8 F (36.6 C) 98 F (36.7 C) (!) 97.4 F (36.3 C) 98.1 F (36.7 C)  TempSrc: Oral Oral Oral Oral  SpO2: 98% 94% 97% 96%  Weight:      Height:       No data found.   Intake/Output Summary (Last 24 hours) at 10/04/2020 1403 Last data filed at 10/04/2020 0507 Gross per 24 hour  Intake 1421.14 ml  Output 300 ml  Net 1121.14 ml   Filed Weights   09/27/20 0848  Weight: 59 kg    Exam:  GEN: NAD SKIN: Warm and dry EYES: EOMI ENT: MMM CV: RRR PULM: CTA B ABD: soft, ND, NT, +BS CNS: AAO x 3, non focal EXT: No edema or tenderness       Data Reviewed:   I have personally reviewed following labs and imaging studies:  Labs: Labs show the following:   Basic Metabolic Panel: Recent Labs  Lab 09/30/20 0900 09/30/20 0900 10/01/20 0819 10/01/20 0819 10/02/20 0430 10/02/20 0430 10/03/20 0105 10/04/20 0613  NA 136  --  132*  --  132*  --  131*  132*  K 3.5   < > 3.7   < > 3.6   < > 3.5 3.8  CL 109  --  105  --  102  --  99 100  CO2 15*  --  19*  --  22  --  20* 24  GLUCOSE 99  --  105*  --  104*  --  150* 111*  BUN 37*  --  31*  --  28*  --  36* 30*  CREATININE 1.30*  --  1.48*  --  1.35*  --  1.60* 1.56*  CALCIUM 8.7*  --  8.7*  --  8.6*  --  8.6* 8.6*  MG  --   --  1.8  --   --   --   --   --   PHOS  --   --  2.4*  --   --   --   --   --    < > = values in this interval not displayed.   GFR Estimated Creatinine Clearance: 22.7 mL/min (A) (by C-G formula based on SCr of 1.56 mg/dL (H)). Liver Function Tests: No results for input(s): AST, ALT, ALKPHOS, BILITOT, PROT, ALBUMIN in the last 168 hours. No results for input(s): LIPASE, AMYLASE in the last 168 hours. No results for input(s): AMMONIA in the last 168 hours. Coagulation profile No results for input(s): INR, PROTIME in the last 168 hours.  CBC: Recent Labs  Lab 09/30/20 0853 09/30/20 0940 10/03/20 0105 10/03/20 0342 10/04/20 0613  WBC ORMOD 9.6 9.8 8.6 7.7  NEUTROABS PENDING 7.8*  --   --  5.6  HGB ORMOD 12.5 12.0 13.1 11.5*  HCT ORMOD 37.5 35.9* 38.8 35.2*  MCV ORMOD 90.6 90.4 89.8 92.1  PLT ORMOD 322 382 416* 343   Cardiac Enzymes: No results for input(s): CKTOTAL, CKMB, CKMBINDEX, TROPONINI in the last 168 hours. BNP (last 3 results) No results for input(s): PROBNP in the last 8760 hours. CBG: No results for input(s): GLUCAP in the last 168 hours. D-Dimer: No results for input(s): DDIMER in the last 72 hours. Hgb A1c: No results for input(s): HGBA1C in the last 72 hours. Lipid Profile: No results for input(s): CHOL, HDL, LDLCALC, TRIG, CHOLHDL, LDLDIRECT in the last 72 hours. Thyroid function studies: No results for input(s): TSH, T4TOTAL, T3FREE, THYROIDAB in the last 72 hours.  Invalid input(s): FREET3 Anemia work up: No results for input(s): VITAMINB12, FOLATE, FERRITIN, TIBC, IRON, RETICCTPCT in the last 72 hours. Sepsis Labs: Recent Labs   Lab 09/30/20 0940 10/03/20 0105 10/03/20 0342 10/04/20 0613  WBC 9.6 9.8 8.6 7.7    Microbiology Recent Results (from the past 240 hour(s))  Blood culture (routine x 2)     Status: None   Collection Time: 09/27/20  9:41 AM   Specimen: BLOOD  Result Value Ref Range Status   Specimen Description BLOOD LEFT ANTECUBITAL  Final   Special Requests   Final    BOTTLES DRAWN AEROBIC AND ANAEROBIC Blood Culture adequate volume   Culture   Final    NO GROWTH 5 DAYS Performed at [facility], [address] [place], [place] [address]    Report Status 10/02/2020 FINAL  Final  Urine culture     Status: Abnormal   Collection Time: 09/27/20 10:37 AM   Specimen: Urine, Random  Result Value Ref Range Status   Specimen Description   Final    URINE, RANDOM Performed at [facility], [address], [place], [place] [address]    Special Requests   Final    NONE Performed at [facility], [address], [place], [place] [address]    Culture 80,000 COLONIES/mL ESCHERICHIA COLI (A)  Final   Report Status  09/29/2020 FINAL  Final   Organism ID, Bacteria ESCHERICHIA COLI (A)  Final      Susceptibility   Escherichia coli - MIC*    AMPICILLIN <=2 SENSITIVE Sensitive     CEFAZOLIN <=4 SENSITIVE Sensitive     CEFEPIME <=0.12 SENSITIVE Sensitive     CEFTRIAXONE <=0.25 SENSITIVE Sensitive     CIPROFLOXACIN <=0.25 SENSITIVE Sensitive     GENTAMICIN <=1 SENSITIVE Sensitive     IMIPENEM <=0.25 SENSITIVE Sensitive     NITROFURANTOIN <=16 SENSITIVE Sensitive     TRIMETH/SULFA <=20 SENSITIVE Sensitive     AMPICILLIN/SULBACTAM <=2 SENSITIVE Sensitive     PIP/TAZO <=4 SENSITIVE Sensitive     * 80,000 COLONIES/mL ESCHERICHIA COLI  Blood culture (routine x 2)     Status: None   Collection Time: 09/27/20 11:15 AM   Specimen: BLOOD  Result Value Ref Range Status   Specimen Description BLOOD LFA  Final   Special Requests   Final    BOTTLES DRAWN AEROBIC AND ANAEROBIC  Blood Culture adequate volume   Culture   Final    NO GROWTH 5 DAYS Performed at [facility], [address] [place], [place] [address]    Report Status 10/02/2020 FINAL  Final  Respiratory Panel by RT PCR (Flu A&B, Covid) - Nasopharyngeal Swab     Status: None   Collection Time: 09/27/20 12:24 PM   Specimen: Nasopharyngeal Swab  Result Value Ref Range Status   SARS Coronavirus 2 by RT PCR NEGATIVE NEGATIVE Final    Comment: (NOTE) SARS-CoV-2 target nucleic acids are NOT DETECTED.  The SARS-CoV-2 RNA is generally detectable in upper respiratoy specimens during the acute phase of infection. The lowest concentration of SARS-CoV-2 viral copies this assay can detect is 131 copies/mL. A negative result does not preclude SARS-Cov-2 infection and should not be used as the sole basis for treatment or other patient management decisions. A negative result may occur with  improper specimen collection/handling, submission of specimen other than nasopharyngeal swab, presence of viral mutation(s) within the areas targeted by this assay, and inadequate number of viral copies (<131 copies/mL). A negative result must be combined with clinical observations, patient history, and epidemiological information. The expected result is Negative.  Fact Sheet for Patients:  PinkCheek.be  Fact Sheet for Healthcare Providers:  GravelBags.it  This test is no t yet approved or cleared by the [place] FDA and  has been authorized for detection and/or diagnosis of SARS-CoV-2 by FDA under an Emergency Use Authorization (EUA). This EUA will remain  in effect (meaning this test can be used) for the duration of the COVID-19 declaration under Section 564(b)(1) of the Act, 21 U.S.C. section 360bbb-3(b)(1), unless the authorization is terminated or revoked sooner.     Influenza A by PCR NEGATIVE NEGATIVE Final   Influenza B by PCR NEGATIVE  NEGATIVE Final    Comment: (NOTE) The Xpert Xpress SARS-CoV-2/FLU/RSV assay is intended as an aid in  the diagnosis of influenza from Nasopharyngeal swab specimens and  should not be used as a sole basis for treatment. Nasal washings and  aspirates are unacceptable for Xpert Xpress SARS-CoV-2/FLU/RSV  testing.  Fact Sheet for Patients: PinkCheek.be  Fact Sheet for Healthcare Providers: GravelBags.it  This test is not yet approved or cleared by the [place] FDA and  has been authorized for detection and/or diagnosis of SARS-CoV-2 by  FDA under an Emergency Use Authorization (EUA). This EUA will remain  in effect (meaning this test can be used)  for the duration of the  Covid-19 declaration under Section 564(b)(1) of the Act, 21  U.S.C. section 360bbb-3(b)(1), unless the authorization is  terminated or revoked. Performed at [facility], [address] [place], [place] [address]     Procedures and diagnostic studies:  No results found.             LOS: 6 days   Quanisha Drewry  [facility]   Pager on [email or web address]. If 7PM-7AM, please contact night-coverage at [email or web address]     10/04/2020, 2:03 PM

## 2020-10-04 NOTE — Progress Notes (Signed)
Cathy Lame, MD [facility]   [address], [place], [place] [address] Phone: [phone] Fax : [phone]   Subjective: The patient was laying in bed with her son at the bedside and he states that she ate well this morning.  She just finished nearly whole tray of food for lunch.  There is no report of any abdominal pain.  The patient states she just wants to go home.  There is no further sign of any GI bleeding.  The son states that he thinks the vomiting was induced by a severe coughing spell she had.  Her hemoglobin did drop somewhat but has been stable.   Objective: Vital signs in last 24 hours: Vitals:   10/03/20 1609 10/03/20 1944 10/03/20 2359 10/04/20 0440  BP: 119/65 136/79 116/73 133/72  Pulse: 83 78 77 77  Resp: 16   16  Temp: 98.5 F (36.9 C) 97.8 F (36.6 C) 98 F (36.7 C) (!) 97.4 F (36.3 C)  TempSrc: Oral Oral Oral Oral  SpO2: 98% 98% 94% 97%  Weight:      Height:       Weight change:   Intake/Output Summary (Last 24 hours) at 10/04/2020 1214 Last data filed at 10/04/2020 0507 Gross per 24 hour  Intake 1421.14 ml  Output 300 ml  Net 1121.14 ml     Exam: Heart:: Regular rate and rhythm, S1S2 present or without murmur or extra heart sounds Lungs: normal and clear to auscultation and percussion Abdomen: soft, nontender, normal bowel sounds   Lab Results: @LABTEST2 @ Micro Results: Recent Results (from the past 240 hour(s))  Blood culture (routine x 2)     Status: None   Collection Time: 09/27/20  9:41 AM   Specimen: BLOOD  Result Value Ref Range Status   Specimen Description BLOOD LEFT ANTECUBITAL  Final   Special Requests   Final    BOTTLES DRAWN AEROBIC AND ANAEROBIC Blood Culture adequate volume   Culture   Final    NO GROWTH 5 DAYS Performed at [facility], [address] [place], [place] [address]    Report Status 10/02/2020 FINAL  Final  Urine culture     Status: Abnormal   Collection Time: 09/27/20 10:37 AM    Specimen: Urine, Random  Result Value Ref Range Status   Specimen Description   Final    URINE, RANDOM Performed at [facility], [address] [place],  [address]    Special Requests   Final    NONE Performed at [facility], [address] [place], [place] [address]    Culture 80,000 COLONIES/mL ESCHERICHIA COLI (A)  Final   Report Status 09/29/2020 FINAL  Final   Organism ID, Bacteria ESCHERICHIA COLI (A)  Final      Susceptibility   Escherichia coli - MIC*    AMPICILLIN <=2 SENSITIVE Sensitive     CEFAZOLIN <=4 SENSITIVE Sensitive     CEFEPIME <=0.12 SENSITIVE Sensitive     CEFTRIAXONE <=0.25 SENSITIVE Sensitive     CIPROFLOXACIN <=0.25 SENSITIVE Sensitive     GENTAMICIN <=1 SENSITIVE Sensitive     IMIPENEM <=0.25 SENSITIVE Sensitive     NITROFURANTOIN <=16 SENSITIVE Sensitive     TRIMETH/SULFA <=20 SENSITIVE Sensitive     AMPICILLIN/SULBACTAM <=2 SENSITIVE Sensitive     PIP/TAZO <=4 SENSITIVE Sensitive     * 80,000 COLONIES/mL ESCHERICHIA COLI  Blood culture (routine x 2)     Status: None   Collection Time: 09/27/20 11:15 AM  Specimen: BLOOD  Result Value Ref Range Status   Specimen Description BLOOD LFA  Final   Special Requests   Final    BOTTLES DRAWN AEROBIC AND ANAEROBIC Blood Culture adequate volume   Culture   Final    NO GROWTH 5 DAYS Performed at [facility], [address] [place], [place] [address]    Report Status 10/02/2020 FINAL  Final  Respiratory Panel by RT PCR (Flu A&B, Covid) - Nasopharyngeal Swab     Status: None   Collection Time: 09/27/20 12:24 PM   Specimen: Nasopharyngeal Swab  Result Value Ref Range Status   SARS Coronavirus 2 by RT PCR NEGATIVE NEGATIVE Final    Comment: (NOTE) SARS-CoV-2 target nucleic acids are NOT DETECTED.  The SARS-CoV-2 RNA is generally detectable in upper respiratoy specimens during the acute phase of infection. The lowest concentration of SARS-CoV-2 viral copies this  assay can detect is 131 copies/mL. A negative result does not preclude SARS-Cov-2 infection and should not be used as the sole basis for treatment or other patient management decisions. A negative result may occur with  improper specimen collection/handling, submission of specimen other than nasopharyngeal swab, presence of viral mutation(s) within the areas targeted by this assay, and inadequate number of viral copies (<131 copies/mL). A negative result must be combined with clinical observations, patient history, and epidemiological information. The expected result is Negative.  Fact Sheet for Patients:  PinkCheek.be  Fact Sheet for Healthcare Providers:  GravelBags.it  This test is no t yet approved or cleared by the [place] FDA and  has been authorized for detection and/or diagnosis of SARS-CoV-2 by FDA under an Emergency Use Authorization (EUA). This EUA will remain  in effect (meaning this test can be used) for the duration of the COVID-19 declaration under Section 564(b)(1) of the Act, 21 U.S.C. section 360bbb-3(b)(1), unless the authorization is terminated or revoked sooner.     Influenza A by PCR NEGATIVE NEGATIVE Final   Influenza B by PCR NEGATIVE NEGATIVE Final    Comment: (NOTE) The Xpert Xpress SARS-CoV-2/FLU/RSV assay is intended as an aid in  the diagnosis of influenza from Nasopharyngeal swab specimens and  should not be used as a sole basis for treatment. Nasal washings and  aspirates are unacceptable for Xpert Xpress SARS-CoV-2/FLU/RSV  testing.  Fact Sheet for Patients: PinkCheek.be  Fact Sheet for Healthcare Providers: GravelBags.it  This test is not yet approved or cleared by the [place] FDA and  has been authorized for detection and/or diagnosis of SARS-CoV-2 by  FDA under an Emergency Use Authorization (EUA). This EUA will  remain  in effect (meaning this test can be used) for the duration of the  Covid-19 declaration under Section 564(b)(1) of the Act, 21  U.S.C. section 360bbb-3(b)(1), unless the authorization is  terminated or revoked. Performed at [facility], [address] [place], [place] [address]    Studies/Results: No results found. Medications: I have reviewed the patient's current medications. Scheduled Meds: . amLODipine  10 mg Oral Daily  . fluticasone furoate-vilanterol  1 puff Inhalation Daily  . ipratropium-albuterol  3 mL Nebulization BID  . levothyroxine  150 mcg Oral Q0600  . lidocaine  1 patch Transdermal Q24H  . metoprolol tartrate  50 mg Oral BID  . nicotine  21 mg Transdermal Daily  . pantoprazole (PROTONIX) IV  40 mg Intravenous BID   Continuous Infusions: PRN Meds:.acetaminophen **OR** acetaminophen, alum & mag hydroxide-simeth, bisacodyl, guaiFENesin-dextromethorphan, ondansetron (ZOFRAN) IV, oxyCODONE, sodium chloride,  white petrolatum   Assessment: Principal Problem:   Acute pyelonephritis Active Problems:   Carotid stenosis   Tobacco abuse   Hypertension   COPD (chronic obstructive pulmonary disease) (HCC)   RBBB   Chronic kidney disease (CKD), stage III (moderate) (HCC)   UTI (urinary tract infection)   Unintentional weight loss   Debility   Atrial fibrillation (HCC)   Hematemesis with nausea   Upper GI bleed    Plan: This patient was witnessed to have some hematemesis but has not had any further signs of nausea or abdominal pain or GI bleeding.  I have spoken to the family about the plan and they agree that at her age and with her frailty she would not be a good candidate for any endoscopic procedures unless they were absolutely necessary.  Again the patient states that she only wants to go home.  I will not plan any further GI work-up this patient unless she has a significant drop in hemoglobin or overt sign of bleeding.  The patient and the  family have been explained the plan and agree with it. . I will sign off.  Please call if any further GI concerns or questions.  We would like to thank you for the opportunity to participate in the care of [name].      LOS: 6 days   Lewayne Bunting 10/04/2020, 12:14 PM Pager [phone] 7am-5pm  Check AMION for 5pm -7am coverage and on weekends

## 2020-10-05 DIAGNOSIS — N136 Pyonephrosis: Secondary | ICD-10-CM | POA: Diagnosis not present

## 2020-10-05 DIAGNOSIS — K922 Gastrointestinal hemorrhage, unspecified: Secondary | ICD-10-CM | POA: Diagnosis not present

## 2020-10-05 DIAGNOSIS — K3189 Other diseases of stomach and duodenum: Secondary | ICD-10-CM | POA: Diagnosis not present

## 2020-10-05 DIAGNOSIS — K4190 Unilateral femoral hernia, without obstruction or gangrene, not specified as recurrent: Secondary | ICD-10-CM | POA: Diagnosis not present

## 2020-10-05 DIAGNOSIS — I10 Essential (primary) hypertension: Secondary | ICD-10-CM | POA: Diagnosis not present

## 2020-10-05 DIAGNOSIS — R52 Pain, unspecified: Secondary | ICD-10-CM | POA: Diagnosis not present

## 2020-10-05 DIAGNOSIS — Z681 Body mass index (BMI) 19 or less, adult: Secondary | ICD-10-CM | POA: Diagnosis not present

## 2020-10-05 DIAGNOSIS — K311 Adult hypertrophic pyloric stenosis: Secondary | ICD-10-CM | POA: Diagnosis not present

## 2020-10-05 DIAGNOSIS — I129 Hypertensive chronic kidney disease with stage 1 through stage 4 chronic kidney disease, or unspecified chronic kidney disease: Secondary | ICD-10-CM | POA: Diagnosis not present

## 2020-10-05 DIAGNOSIS — Z7901 Long term (current) use of anticoagulants: Secondary | ICD-10-CM | POA: Diagnosis not present

## 2020-10-05 DIAGNOSIS — M419 Scoliosis, unspecified: Secondary | ICD-10-CM | POA: Diagnosis not present

## 2020-10-05 DIAGNOSIS — E876 Hypokalemia: Secondary | ICD-10-CM | POA: Diagnosis present

## 2020-10-05 DIAGNOSIS — N135 Crossing vessel and stricture of ureter without hydronephrosis: Secondary | ICD-10-CM | POA: Diagnosis not present

## 2020-10-05 DIAGNOSIS — E43 Unspecified severe protein-calorie malnutrition: Secondary | ICD-10-CM | POA: Diagnosis not present

## 2020-10-05 DIAGNOSIS — R1012 Left upper quadrant pain: Secondary | ICD-10-CM | POA: Diagnosis not present

## 2020-10-05 DIAGNOSIS — Z79890 Hormone replacement therapy (postmenopausal): Secondary | ICD-10-CM | POA: Diagnosis not present

## 2020-10-05 DIAGNOSIS — Z789 Other specified health status: Secondary | ICD-10-CM | POA: Diagnosis not present

## 2020-10-05 DIAGNOSIS — I2510 Atherosclerotic heart disease of native coronary artery without angina pectoris: Secondary | ICD-10-CM | POA: Diagnosis present

## 2020-10-05 DIAGNOSIS — R279 Unspecified lack of coordination: Secondary | ICD-10-CM | POA: Diagnosis not present

## 2020-10-05 DIAGNOSIS — N1330 Unspecified hydronephrosis: Secondary | ICD-10-CM | POA: Diagnosis not present

## 2020-10-05 DIAGNOSIS — J9811 Atelectasis: Secondary | ICD-10-CM | POA: Diagnosis not present

## 2020-10-05 DIAGNOSIS — K5900 Constipation, unspecified: Secondary | ICD-10-CM | POA: Diagnosis not present

## 2020-10-05 DIAGNOSIS — B370 Candidal stomatitis: Secondary | ICD-10-CM | POA: Diagnosis not present

## 2020-10-05 DIAGNOSIS — R0902 Hypoxemia: Secondary | ICD-10-CM | POA: Diagnosis not present

## 2020-10-05 DIAGNOSIS — Z7189 Other specified counseling: Secondary | ICD-10-CM | POA: Diagnosis not present

## 2020-10-05 DIAGNOSIS — M6281 Muscle weakness (generalized): Secondary | ICD-10-CM | POA: Diagnosis not present

## 2020-10-05 DIAGNOSIS — R5381 Other malaise: Secondary | ICD-10-CM | POA: Diagnosis not present

## 2020-10-05 DIAGNOSIS — N10 Acute tubulo-interstitial nephritis: Secondary | ICD-10-CM | POA: Diagnosis not present

## 2020-10-05 DIAGNOSIS — K4090 Unilateral inguinal hernia, without obstruction or gangrene, not specified as recurrent: Secondary | ICD-10-CM | POA: Diagnosis not present

## 2020-10-05 DIAGNOSIS — K551 Chronic vascular disorders of intestine: Secondary | ICD-10-CM | POA: Diagnosis not present

## 2020-10-05 DIAGNOSIS — J9611 Chronic respiratory failure with hypoxia: Secondary | ICD-10-CM | POA: Diagnosis not present

## 2020-10-05 DIAGNOSIS — N390 Urinary tract infection, site not specified: Secondary | ICD-10-CM | POA: Diagnosis not present

## 2020-10-05 DIAGNOSIS — Z79899 Other long term (current) drug therapy: Secondary | ICD-10-CM | POA: Diagnosis not present

## 2020-10-05 DIAGNOSIS — Z20822 Contact with and (suspected) exposure to covid-19: Secondary | ICD-10-CM | POA: Diagnosis not present

## 2020-10-05 DIAGNOSIS — J439 Emphysema, unspecified: Secondary | ICD-10-CM | POA: Diagnosis not present

## 2020-10-05 DIAGNOSIS — J449 Chronic obstructive pulmonary disease, unspecified: Secondary | ICD-10-CM | POA: Diagnosis not present

## 2020-10-05 DIAGNOSIS — N1832 Chronic kidney disease, stage 3b: Secondary | ICD-10-CM | POA: Diagnosis not present

## 2020-10-05 DIAGNOSIS — Z515 Encounter for palliative care: Secondary | ICD-10-CM | POA: Diagnosis not present

## 2020-10-05 DIAGNOSIS — R1084 Generalized abdominal pain: Secondary | ICD-10-CM | POA: Diagnosis not present

## 2020-10-05 DIAGNOSIS — I480 Paroxysmal atrial fibrillation: Secondary | ICD-10-CM | POA: Diagnosis not present

## 2020-10-05 DIAGNOSIS — R110 Nausea: Secondary | ICD-10-CM | POA: Diagnosis not present

## 2020-10-05 DIAGNOSIS — K315 Obstruction of duodenum: Secondary | ICD-10-CM | POA: Diagnosis not present

## 2020-10-05 DIAGNOSIS — R69 Illness, unspecified: Secondary | ICD-10-CM | POA: Diagnosis not present

## 2020-10-05 DIAGNOSIS — K56609 Unspecified intestinal obstruction, unspecified as to partial versus complete obstruction: Secondary | ICD-10-CM | POA: Diagnosis not present

## 2020-10-05 DIAGNOSIS — Z720 Tobacco use: Secondary | ICD-10-CM | POA: Diagnosis not present

## 2020-10-05 DIAGNOSIS — K209 Esophagitis, unspecified without bleeding: Secondary | ICD-10-CM | POA: Diagnosis present

## 2020-10-05 DIAGNOSIS — Q6211 Congenital occlusion of ureteropelvic junction: Secondary | ICD-10-CM | POA: Diagnosis not present

## 2020-10-05 DIAGNOSIS — E039 Hypothyroidism, unspecified: Secondary | ICD-10-CM | POA: Diagnosis not present

## 2020-10-05 DIAGNOSIS — Z4682 Encounter for fitting and adjustment of non-vascular catheter: Secondary | ICD-10-CM | POA: Diagnosis not present

## 2020-10-05 DIAGNOSIS — I69354 Hemiplegia and hemiparesis following cerebral infarction affecting left non-dominant side: Secondary | ICD-10-CM | POA: Diagnosis not present

## 2020-10-05 DIAGNOSIS — R140 Abdominal distension (gaseous): Secondary | ICD-10-CM | POA: Diagnosis not present

## 2020-10-05 DIAGNOSIS — B9620 Unspecified Escherichia coli [E. coli] as the cause of diseases classified elsewhere: Secondary | ICD-10-CM | POA: Diagnosis not present

## 2020-10-05 DIAGNOSIS — E440 Moderate protein-calorie malnutrition: Secondary | ICD-10-CM | POA: Diagnosis not present

## 2020-10-05 DIAGNOSIS — I4891 Unspecified atrial fibrillation: Secondary | ICD-10-CM | POA: Diagnosis not present

## 2020-10-05 DIAGNOSIS — N12 Tubulo-interstitial nephritis, not specified as acute or chronic: Secondary | ICD-10-CM | POA: Diagnosis not present

## 2020-10-05 DIAGNOSIS — Z853 Personal history of malignant neoplasm of breast: Secondary | ICD-10-CM | POA: Diagnosis not present

## 2020-10-05 DIAGNOSIS — N179 Acute kidney failure, unspecified: Secondary | ICD-10-CM | POA: Diagnosis present

## 2020-10-05 DIAGNOSIS — Z9981 Dependence on supplemental oxygen: Secondary | ICD-10-CM | POA: Diagnosis not present

## 2020-10-05 LAB — HEPATIC FUNCTION PANEL
ALT: 9 U/L (ref 0–44)
AST: 13 U/L — ABNORMAL LOW (ref 15–41)
Albumin: 2.1 g/dL — ABNORMAL LOW (ref 3.5–5.0)
Alkaline Phosphatase: 45 U/L (ref 38–126)
Bilirubin, Direct: <0.1 mg/dL (ref 0.0–0.2)
Total Bilirubin: 0.3 mg/dL (ref 0.3–1.2)
Total Protein: 5.7 g/dL — ABNORMAL LOW (ref 6.5–8.1)

## 2020-10-05 LAB — SARS CORONAVIRUS 2 BY RT PCR (HOSPITAL ORDER, PERFORMED IN ~~LOC~~ HOSPITAL LAB): SARS Coronavirus 2: NEGATIVE

## 2020-10-05 MED ORDER — APIXABAN 2.5 MG PO TABS
2.5000 mg | ORAL_TABLET | Freq: Two times a day (BID) | ORAL | Status: DC
Start: 2020-10-05 — End: 2020-12-28

## 2020-10-05 MED ORDER — ACETAMINOPHEN 325 MG PO TABS: 650.0000 mg | ORAL_TABLET | Freq: Four times a day (QID) | ORAL | Status: AC | PRN

## 2020-10-05 MED ORDER — FLUTICASONE FUROATE-VILANTEROL 200-25 MCG/INH IN AEPB: 1.0000 | INHALATION_SPRAY | Freq: Every day | RESPIRATORY_TRACT | Status: DC

## 2020-10-05 MED ORDER — APIXABAN 2.5 MG PO TABS
2.5000 mg | ORAL_TABLET | Freq: Two times a day (BID) | ORAL | Status: DC
  Administered 2020-10-05: 2.5 mg via ORAL
  Filled 2020-10-05: qty 1

## 2020-10-05 MED ORDER — METOPROLOL TARTRATE 50 MG PO TABS
50.0000 mg | ORAL_TABLET | Freq: Two times a day (BID) | ORAL | Status: DC
Start: 2020-10-05 — End: 2020-12-20

## 2020-10-05 MED ORDER — NICOTINE 21 MG/24HR TD PT24
21.0000 mg | MEDICATED_PATCH | Freq: Every day | TRANSDERMAL | Status: DC
Start: 2020-10-06 — End: 2020-12-11

## 2020-10-05 MED ORDER — ENSURE ENLIVE PO LIQD
237.0000 mL | Freq: Two times a day (BID) | ORAL | Status: DC
  Administered 2020-10-05 (×2): 237 mL via ORAL

## 2020-10-05 MED ORDER — ENSURE ENLIVE PO LIQD: 237.0000 mL | Freq: Two times a day (BID) | ORAL | Status: AC

## 2020-10-05 NOTE — Discharge Summary (Addendum)
Physician Discharge Summary  [name] XAJ:[id number] DOB: 1941/09/29 DOA: 09/27/2020  PCP: Valerie Roys, DO  Admit date: 09/27/2020 Discharge date: 10/05/2020  Discharge disposition: Skilled nursing facility   Recommendations for Outpatient Follow-Up:   Outpatient follow-up with PCP, urologist and cardiologist for routine health maintenance recommended.   Discharge Diagnosis:   Principal Problem:   Acute pyelonephritis Active Problems:   Carotid stenosis   Tobacco abuse   Hypertension   COPD (chronic obstructive pulmonary disease) (HCC)   RBBB   Chronic kidney disease (CKD), stage III (moderate) (HCC)   UTI (urinary tract infection)   Unintentional weight loss   Debility   Atrial fibrillation (HCC)   Hematemesis with nausea   Upper GI bleed    Discharge Condition: Stable.  Diet recommendation:  Diet Order            Diet - low sodium heart healthy           Diet Heart Room service appropriate? Yes; Fluid consistency: Thin  Diet effective now                   Code Status: Full Code     Hospital Course:   [name] is a 79 y.o. 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  She presented to the hospital because of generalized weakness and back pain of about 3 days duration.  She was found to have acute pyelonephritis and AKI.  She was treated with empiric IV antibiotics, IV fluids and analgesics.  Urine culture showed E. coli and she was switched to oral Levaquin.  She was evaluated by the urologist who recommended outpatient follow-up.  She was also seen by the cardiologist for atrial fibrillation with RVR.  She was started on Eliquis for stroke prophylaxis (cha2ds2vasc score of 6)  She  was evaluated by PT and OT who recommended further rehabilitation at a skilled nursing facility.  She has chronic hypoxemic respiratory failure likely from COPD.  Attempts to wean her off of oxygen has been unsuccessful. She will therefore be discharged to SNF with 2 L/min oxygen via nasal cannula.   She has nausea and poor oral intake.  Use of nutritional supplements such as Ensure is recommended for adequate nutrition.  Patient should be encouraged to drink lots of fluids to prevent dehydration.  Discharge plan was discussed with her sister, [name] at the bedside, and her son, [name], over the phone.    Medical Consultants:    Editor, commissioning  Cardiologist   Discharge Exam:    Vitals:   10/04/20 1944 10/04/20 2302 10/05/20 0449 10/05/20 0828  BP: 140/72 (!) 144/83 131/83   Pulse: 71 79 69   Resp: 16 16 20    Temp: 98.4 F (36.9 C) 98.5 F (36.9 C) (!) 97 F (36.1 C)   TempSrc:  Oral    SpO2: 95% 93% 93% 94%  Weight:      Height:         GEN: NAD SKIN: No rash EYES: EOMI ENT: MMM CV: RRR PULM: CTA B ABD: soft, ND, NT, +BS CNS: AAO x 3, non focal EXT: No edema or tenderness   The results of significant diagnostics from this hospitalization (including imaging, microbiology, ancillary and laboratory) are listed below for reference.  Procedures and Diagnostic Studies:   DG Chest Port 1 View  Result Date: 09/29/2020 CLINICAL DATA:  Chest pain. EXAM: PORTABLE CHEST 1 VIEW COMPARISON:  January 14, 2019. FINDINGS: The heart size and mediastinal contours are within normal limits. No pneumothorax is noted. Right basilar atelectasis or infiltrate is noted. Small bilateral pleural effusions are noted. The visualized skeletal structures are unremarkable. IMPRESSION: Right basilar atelectasis or infiltrate is noted. Small bilateral pleural effusions. Followup PA and lateral chest X-ray is recommended in 3-4 weeks following trial of antibiotic therapy to ensure  resolution and exclude underlying malignancy. Aortic Atherosclerosis (ICD10-I70.0). Electronically Signed   By: Marijo Conception M.D.   On: 09/29/2020 18:52   ECHOCARDIOGRAM COMPLETE  Result Date: 09/29/2020    ECHOCARDIOGRAM REPORT   Patient Name:   Cathy Solis Date of Exam: 09/29/2020 Medical Rec #:  [id number]      Height:       60.0 in Accession #:    [id number]     Weight:       130.0 lb Date of Birth:  [birth year]      BSA:          1.554 m Patient Age:    79 years       BP:           144/71 mmHg Patient Gender: F              HR:           84 bpm. Exam Location:  [facility] Procedure: 2D Echo, Cardiac Doppler and Color Doppler Indications:     Atrial Fibrillation 427.31  History:         Patient has no prior history of Echocardiogram examinations.                  Stroke; Risk Factors:Hypertension.  Sonographer:     Sherrie Sport RDCS (AE) Referring Phys:  [id number] Kate Sable Diagnosing Phys: Nelva Bush MD IMPRESSIONS  1. Left ventricular ejection fraction, by estimation, is 70 to 75%. The left ventricle has hyperdynamic function. The left ventricle has no regional wall motion abnormalities. Left ventricular diastolic parameters are consistent with Grade I diastolic dysfunction (impaired relaxation).  2. Right ventricular systolic function is normal. The right ventricular size is mildly enlarged. Mildly increased right ventricular wall thickness. There is moderately elevated pulmonary artery systolic pressure.  3. Right atrial size was moderately dilated.  4. The mitral valve is degenerative. Mild mitral valve regurgitation. No evidence of mitral stenosis.  5. Tricuspid valve regurgitation is mild to moderate.  6. The aortic valve is tricuspid. There is mild calcification of the aortic valve. There is moderate thickening of the aortic valve. Aortic valve regurgitation is not visualized. Mild to moderate aortic valve sclerosis/calcification is present, without any evidence of aortic stenosis. FINDINGS   Left Ventricle: Left ventricular ejection fraction, by estimation, is 70 to 75%. The left ventricle has hyperdynamic function. The left ventricle has no regional wall motion abnormalities. The left ventricular internal cavity size was normal in size. There is borderline left ventricular hypertrophy. Left ventricular diastolic parameters are consistent with Grade I diastolic dysfunction (impaired relaxation). Right Ventricle: Pulmonary artery pressure is 40-45 mmHg plus central venous pressure. The right ventricular size is mildly enlarged. Mildly increased right ventricular wall thickness. Right ventricular systolic function is normal. There is moderately elevated pulmonary artery systolic pressure. Left Atrium: Left atrial size was normal in size. Right Atrium: Right atrial size was moderately dilated. Pericardium:  There is no evidence of pericardial effusion. Mitral Valve: The mitral valve is degenerative in appearance. There is mild thickening of the mitral valve leaflet(s). There is mild calcification of the mitral valve leaflet(s). Mild mitral valve regurgitation. No evidence of mitral valve stenosis. Tricuspid Valve: The tricuspid valve is not well visualized. Tricuspid valve regurgitation is mild to moderate. Aortic Valve: The aortic valve is tricuspid. There is mild calcification of the aortic valve. There is moderate thickening of the aortic valve. Aortic valve regurgitation is not visualized. Mild to moderate aortic valve sclerosis/calcification is present, without any evidence of aortic stenosis. Aortic valve mean gradient measures 2.0 mmHg. Aortic valve peak gradient measures 3.5 mmHg. Aortic valve area, by VTI measures 3.65 cm. Pulmonic Valve: The pulmonic valve was not well visualized. Pulmonic valve regurgitation is not visualized. Aorta: The aortic root is normal in size and structure. Pulmonary Artery: The pulmonary artery is of normal size. Venous: The inferior vena cava was not well visualized.  IAS/Shunts: The interatrial septum was not assessed.  LEFT VENTRICLE PLAX 2D LVIDd:         3.17 cm LVIDs:         1.67 cm LV PW:         0.89 cm LV IVS:        1.07 cm LVOT diam:     2.00 cm LV SV:         62 LV SV Index:   40 LVOT Area:     3.14 cm  RIGHT VENTRICLE RV Basal diam:  2.64 cm RV S prime:     17.80 cm/s TAPSE (M-mode): 2.9 cm LEFT ATRIUM             Index       RIGHT ATRIUM           Index LA diam:        4.30 cm 2.77 cm/m  RA Area:     27.30 cm LA Vol (A2C):   42.7 ml 27.47 ml/m RA Volume:   94.40 ml  60.73 ml/m LA Vol (A4C):   48.4 ml 31.14 ml/m LA Biplane Vol: 48.5 ml 31.20 ml/m  AORTIC VALVE                   PULMONIC VALVE AV Area (Vmax):    3.19 cm    PV Vmax:        0.75 m/s AV Area (Vmean):   3.64 cm    PV Peak grad:   2.2 mmHg AV Area (VTI):     3.65 cm    RVOT Peak grad: 2 mmHg AV Vmax:           94.00 cm/s AV Vmean:          63.550 cm/s AV VTI:            0.169 m AV Peak Grad:      3.5 mmHg AV Mean Grad:      2.0 mmHg LVOT Vmax:         95.30 cm/s LVOT Vmean:        73.700 cm/s LVOT VTI:          0.197 m LVOT/AV VTI ratio: 1.16  AORTA Ao Root diam: 2.60 cm MITRAL VALVE                TRICUSPID VALVE MV Area (PHT): 3.20 cm     TR Peak grad:   41.5 mmHg MV Decel Time: 237 msec  TR Vmax:        322.00 cm/s MV E velocity: 129.00 cm/s                             SHUNTS                             Systemic VTI:  0.20 m                             Systemic Diam: 2.00 cm Nelva Bush MD Electronically signed by Nelva Bush MD Signature Date/Time: 09/29/2020/5:02:52 PM    Final      Labs:   Basic Metabolic Panel: Recent Labs  Lab 09/30/20 0900 09/30/20 0900 10/01/20 0819 10/01/20 0819 10/02/20 0430 10/02/20 0430 10/03/20 0105 10/04/20 0613  NA 136  --  132*  --  132*  --  131* 132*  K 3.5   < > 3.7   < > 3.6   < > 3.5 3.8  CL 109  --  105  --  102  --  99 100  CO2 15*  --  19*  --  22  --  20* 24  GLUCOSE 99  --  105*  --  104*  --  150* 111*  BUN 37*  --   31*  --  28*  --  36* 30*  CREATININE 1.30*  --  1.48*  --  1.35*  --  1.60* 1.56*  CALCIUM 8.7*  --  8.7*  --  8.6*  --  8.6* 8.6*  MG  --   --  1.8  --   --   --   --   --   PHOS  --   --  2.4*  --   --   --   --   --    < > = values in this interval not displayed.   GFR Estimated Creatinine Clearance: 22.7 mL/min (A) (by C-G formula based on SCr of 1.56 mg/dL (H)). Liver Function Tests: Recent Labs  Lab 10/04/20 0613  AST 13*  ALT 9  ALKPHOS 45  BILITOT 0.3  PROT 5.7*  ALBUMIN 2.1*   No results for input(s): LIPASE, AMYLASE in the last 168 hours. No results for input(s): AMMONIA in the last 168 hours. Coagulation profile No results for input(s): INR, PROTIME in the last 168 hours.  CBC: Recent Labs  Lab 09/30/20 0853 09/30/20 0940 10/03/20 0105 10/03/20 0342 10/04/20 0613  WBC ORMOD 9.6 9.8 8.6 7.7  NEUTROABS PENDING 7.8*  --   --  5.6  HGB ORMOD 12.5 12.0 13.1 11.5*  HCT ORMOD 37.5 35.9* 38.8 35.2*  MCV ORMOD 90.6 90.4 89.8 92.1  PLT ORMOD 322 382 416* 343   Cardiac Enzymes: No results for input(s): CKTOTAL, CKMB, CKMBINDEX, TROPONINI in the last 168 hours. BNP: Invalid input(s): POCBNP CBG: No results for input(s): GLUCAP in the last 168 hours. D-Dimer No results for input(s): DDIMER in the last 72 hours. Hgb A1c No results for input(s): HGBA1C in the last 72 hours. Lipid Profile No results for input(s): CHOL, HDL, LDLCALC, TRIG, CHOLHDL, LDLDIRECT in the last 72 hours. Thyroid function studies No results for input(s): TSH, T4TOTAL, T3FREE, THYROIDAB in the last 72 hours.  Invalid input(s): FREET3 Anemia work up No results for input(s): VITAMINB12, FOLATE, FERRITIN, TIBC, IRON, RETICCTPCT in the  last 72 hours. Microbiology Recent Results (from the past 240 hour(s))  Blood culture (routine x 2)     Status: None   Collection Time: 09/27/20  9:41 AM   Specimen: BLOOD  Result Value Ref Range Status   Specimen Description BLOOD LEFT ANTECUBITAL  Final    Special Requests   Final    BOTTLES DRAWN AEROBIC AND ANAEROBIC Blood Culture adequate volume   Culture   Final    NO GROWTH 5 DAYS Performed at [facility], [address] [place], [place] [address]    Report Status 10/02/2020 FINAL  Final  Urine culture     Status: Abnormal   Collection Time: 09/27/20 10:37 AM   Specimen: Urine, Random  Result Value Ref Range Status   Specimen Description   Final    URINE, RANDOM Performed at [facility], [address] [place], [place] [address]    Special Requests   Final    NONE Performed at [facility], [address] [place], [place] [address]    Culture 80,000 COLONIES/mL ESCHERICHIA COLI (A)  Final   Report Status 09/29/2020 FINAL  Final   Organism ID, Bacteria ESCHERICHIA COLI (A)  Final      Susceptibility   Escherichia coli - MIC*    AMPICILLIN <=2 SENSITIVE Sensitive     CEFAZOLIN <=4 SENSITIVE Sensitive     CEFEPIME <=0.12 SENSITIVE Sensitive     CEFTRIAXONE <=0.25 SENSITIVE Sensitive     CIPROFLOXACIN <=0.25 SENSITIVE Sensitive     GENTAMICIN <=1 SENSITIVE Sensitive     IMIPENEM <=0.25 SENSITIVE Sensitive     NITROFURANTOIN <=16 SENSITIVE Sensitive     TRIMETH/SULFA <=20 SENSITIVE Sensitive     AMPICILLIN/SULBACTAM <=2 SENSITIVE Sensitive     PIP/TAZO <=4 SENSITIVE Sensitive     * 80,000 COLONIES/mL ESCHERICHIA COLI  Blood culture (routine x 2)     Status: None   Collection Time: 09/27/20 11:15 AM   Specimen: BLOOD  Result Value Ref Range Status   Specimen Description BLOOD LFA  Final   Special Requests   Final    BOTTLES DRAWN AEROBIC AND ANAEROBIC Blood Culture adequate volume   Culture   Final    NO GROWTH 5 DAYS Performed at [facility], [address] [place],  [address]    Report Status 10/02/2020 FINAL  Final  Respiratory Panel by RT PCR (Flu A&B, Covid) - Nasopharyngeal Swab     Status: None   Collection Time: 09/27/20 12:24 PM   Specimen: Nasopharyngeal  Swab  Result Value Ref Range Status   SARS Coronavirus 2 by RT PCR NEGATIVE NEGATIVE Final    Comment: (NOTE) SARS-CoV-2 target nucleic acids are NOT DETECTED.  The SARS-CoV-2 RNA is generally detectable in upper respiratoy specimens during the acute phase of infection. The lowest concentration of SARS-CoV-2 viral copies this assay can detect is 131 copies/mL. A negative result does not preclude SARS-Cov-2 infection and should not be used as the sole basis for treatment or other patient management decisions. A negative result may occur with  improper specimen collection/handling, submission of specimen other than nasopharyngeal swab, presence of viral mutation(s) within the areas targeted by this assay, and inadequate number of viral copies (<131 copies/mL). A negative result must be combined with clinical observations, patient history, and epidemiological information. The expected result is Negative.  Fact Sheet for Patients:  PinkCheek.be  Fact Sheet for Healthcare Providers:  GravelBags.it  This test is no t yet approved or  cleared by the [place] and  has been authorized for detection and/or diagnosis of SARS-CoV-2 by FDA under an Emergency Use Authorization (EUA). This EUA will remain  in effect (meaning this test can be used) for the duration of the COVID-19 declaration under Section 564(b)(1) of the Act, 21 U.S.C. section 360bbb-3(b)(1), unless the authorization is terminated or revoked sooner.     Influenza A by PCR NEGATIVE NEGATIVE Final   Influenza B by PCR NEGATIVE NEGATIVE Final    Comment: (NOTE) The Xpert Xpress SARS-CoV-2/FLU/RSV assay is intended as an aid in  the diagnosis of influenza from Nasopharyngeal swab specimens and  should not be used as a sole basis for treatment. Nasal washings and  aspirates are unacceptable for Xpert Xpress SARS-CoV-2/FLU/RSV  testing.  Fact Sheet for  Patients: PinkCheek.be  Fact Sheet for Healthcare Providers: GravelBags.it  This test is not yet approved or cleared by the [place] FDA and  has been authorized for detection and/or diagnosis of SARS-CoV-2 by  FDA under an Emergency Use Authorization (EUA). This EUA will remain  in effect (meaning this test can be used) for the duration of the  Covid-19 declaration under Section 564(b)(1) of the Act, 21  U.S.C. section 360bbb-3(b)(1), unless the authorization is  terminated or revoked. Performed at [facility], [address], [place], [place] [address]      Discharge Instructions:   Discharge Instructions    Diet - low sodium heart healthy   Complete by: As directed    Discharge wound care:   Complete by: As directed    Keep sacral area clean and dry   Increase activity slowly   Complete by: As directed    No wound care   Complete by: As directed      Allergies as of 10/05/2020      Reactions   Statins Other (See Comments)   Myalgias   Zetia [ezetimibe] Other (See Comments)   myalgias      Medication List    STOP taking these medications   amLODipine 10 MG tablet Commonly known as: NORVASC   aspirin EC 81 MG tablet     TAKE these medications   acetaminophen 325 MG tablet Commonly known as: TYLENOL Take 2 tablets (650 mg total) by mouth every 6 (six) hours as needed for mild pain (or Fever >/= 101).   albuterol 108 (90 Base) MCG/ACT inhaler Commonly known as: VENTOLIN HFA Inhale 2 puffs into the lungs every 6 (six) hours as needed for wheezing or shortness of breath.   apixaban 2.5 MG Tabs tablet Commonly known as: ELIQUIS Take 1 tablet (2.5 mg total) by mouth 2 (two) times daily.   feeding supplement Liqd Take 237 mLs by mouth 2 (two) times daily between meals.   fluticasone furoate-vilanterol 200-25 MCG/INH Aepb Commonly known as: BREO ELLIPTA Inhale 1 puff into the lungs  daily. Start taking on: October 06, 2020   ipratropium-albuterol 0.5-2.5 (3) MG/3ML Soln Commonly known as: DUONEB Take 3 mLs by nebulization 2 (two) times daily for 4 days. DuoNeb nebulizer treatments twice a day for 4 days followed by as needed for severe shortness of breath What changed: additional instructions   levothyroxine 150 MCG tablet Commonly known as: SYNTHROID Take 1 tablet (150 mcg total) by mouth daily.   lisinopril 10 MG tablet Commonly known as: ZESTRIL Take 1 tablet (10 mg total) by mouth daily. TAKE 1 TABLET BY MOUTH EVERY DAY   metoprolol tartrate 50 MG tablet Commonly  known as: LOPRESSOR Take 1 tablet (50 mg total) by mouth 2 (two) times daily. What changed:   medication strength  how much to take   nicotine 21 mg/24hr patch Commonly known as: NICODERM CQ - dosed in mg/24 hours Place 1 patch (21 mg total) onto the skin daily. Start taking on: October 06, 2020            Discharge Care Instructions  (From admission, onward)         Start     Ordered   10/05/20 0000  Discharge wound care:       Comments: Keep sacral area clean and dry   10/05/20 1129          Contact information for after-discharge care    [facility] .   Service: Skilled Nursing Contact information: [place] [phone]                   Time coordinating discharge: 32 minutes  Signed:  Jennye Boroughs  [facility] 10/05/2020, 11:42 AM   Pager on [email or web address]. If 7PM-7AM, please contact night-coverage at [email or web address]

## 2020-10-05 NOTE — TOC Progression Note (Signed)
Transition of Care [facility]) - Progression Note    Patient Details  Name: Cathy Solis MRN: [id number] Date of Birth: [birth year]  Transition of Care [facility]) CM/SW Contact  Beverly Sessions, RN Phone Number: 10/05/2020, 10:57 AM  Clinical Narrative:      Notified by Magda Paganini from [facility] that insurance auth at been obtained.  MD notified to determine if patient medically ready for discharge  Will require repeat covid test prior to discharge  Expected Discharge Plan: [place] Barriers to Discharge: Continued Medical Work up  Expected Discharge Plan and Services Expected Discharge Plan: [place] arrangements for the past 2 months: Single Family Home                                       Social Determinants of Health (SDOH) Interventions    Readmission Risk Interventions No flowsheet data found.

## 2020-10-05 NOTE — Care Management Important Message (Signed)
Important Message  Patient Details  Name: Cathy Solis MRN: [id number] Date of Birth: 02-10-1941   Medicare Important Message Given:  Yes     [name] 10/05/2020, 11:17 AM

## 2020-10-05 NOTE — Progress Notes (Signed)
Report given to Curlene Dolphin. All outstanding questions resolved.

## 2020-10-05 NOTE — Progress Notes (Signed)
Pt dressed and ready for discharge to [facility]. L Arm PIV removed. Cannula intact. Pt tolerated well. All belongings packed and in tow.   AVS and MAR printed, placed in chart.

## 2020-10-05 NOTE — TOC Transition Note (Signed)
Transition of Care [facility]) - CM/SW Discharge Note   Patient Details  Name: Cathy Solis MRN: [id number] Date of Birth: 10-19-1941  Transition of Care [facility]) CM/SW Contact:  Beverly Sessions, RN Phone Number: 10/05/2020, 2:23 PM   Clinical Narrative:     Patient to discharge to [facility] today Patient and sister who was at bedside notified.  Called and updated son per patient's request  DC summary sent in [facility] packet on chart.  EMS transport arranged for 4pm  Bedside RN to call report Repeat covid test negative   Final next level of care: [place] Barriers to Discharge: No Barriers Identified   Patient Goals and CMS Choice        Discharge Placement              Patient chooses bed at: [facility] Patient to be transferred to facility by: EMS Name of family member notified: Salome Spotted Patient and family notified of of transfer: 10/05/20  Discharge Plan and Services                                     Social Determinants of Health (SDOH) Interventions     Readmission Risk Interventions No flowsheet data found.

## 2020-10-05 NOTE — Plan of Care (Signed)
  Problem: Education: Goal: Knowledge of General Education information will improve Description: Including pain rating scale, medication(s)/side effects and non-pharmacologic comfort measures 10/05/2020 1136 by Cristela Blue, RN Outcome: Progressing 10/05/2020 1136 by Cristela Blue, RN Outcome: Progressing   Problem: Health Behavior/Discharge Planning: Goal: Ability to manage health-related needs will improve 10/05/2020 1136 by Cristela Blue, RN Outcome: Progressing 10/05/2020 1136 by Cristela Blue, RN Outcome: Progressing   Problem: Clinical Measurements: Goal: Ability to maintain clinical measurements within normal limits will improve 10/05/2020 1136 by Cristela Blue, RN Outcome: Progressing 10/05/2020 1136 by Cristela Blue, RN Outcome: Progressing Goal: Will remain free from infection 10/05/2020 1136 by Cristela Blue, RN Outcome: Progressing 10/05/2020 1136 by Cristela Blue, RN Outcome: Progressing Goal: Diagnostic test results will improve 10/05/2020 1136 by Cristela Blue, RN Outcome: Progressing 10/05/2020 1136 by Cristela Blue, RN Outcome: Progressing Goal: Respiratory complications will improve 10/05/2020 1136 by Cristela Blue, RN Outcome: Progressing 10/05/2020 1136 by Cristela Blue, RN Outcome: Progressing Goal: Cardiovascular complication will be avoided 10/05/2020 1136 by Cristela Blue, RN Outcome: Progressing 10/05/2020 1136 by Cristela Blue, RN Outcome: Progressing   Problem: Activity: Goal: Risk for activity intolerance will decrease 10/05/2020 1136 by Cristela Blue, RN Outcome: Progressing 10/05/2020 1136 by Cristela Blue, RN Outcome: Progressing   Problem: Nutrition: Goal: Adequate nutrition will be maintained 10/05/2020 1136 by Cristela Blue, RN Outcome: Progressing 10/05/2020 1136 by Cristela Blue, RN Outcome: Progressing   Problem: Coping: Goal: Level of anxiety will decrease 10/05/2020 1136 by Cristela Blue, RN Outcome:  Progressing 10/05/2020 1136 by Cristela Blue, RN Outcome: Progressing   Problem: Elimination: Goal: Will not experience complications related to bowel motility 10/05/2020 1136 by Cristela Blue, RN Outcome: Progressing 10/05/2020 1136 by Cristela Blue, RN Outcome: Progressing Goal: Will not experience complications related to urinary retention 10/05/2020 1136 by Cristela Blue, RN Outcome: Progressing 10/05/2020 1136 by Cristela Blue, RN Outcome: Progressing   Problem: Pain Managment: Goal: General experience of comfort will improve 10/05/2020 1136 by Cristela Blue, RN Outcome: Progressing 10/05/2020 1136 by Cristela Blue, RN Outcome: Progressing   Problem: Safety: Goal: Ability to remain free from injury will improve 10/05/2020 1136 by Cristela Blue, RN Outcome: Progressing 10/05/2020 1136 by Cristela Blue, RN Outcome: Progressing   Problem: Skin Integrity: Goal: Risk for impaired skin integrity will decrease 10/05/2020 1136 by Cristela Blue, RN Outcome: Progressing 10/05/2020 1136 by Cristela Blue, RN Outcome: Progressing

## 2020-10-05 NOTE — Progress Notes (Signed)
Occupational Therapy Treatment Patient Details Name: THU BAGGETT MRN: [id number] DOB: 1941-03-16 Today's Date: 10/05/2020    History of present illness 79 y.o. female with medical history significant for hypothyroid, hypertension, chronic tobacco use, current tobacco use, COPD, solitary left kidney with chronic hydronephrosis (right nephrectomy in 2008/2009 and minimal information available in Care Everywhere), paroxysmal A. fib, chronic bilateral carotid stenosis stenosis via ultrasound imaging on 11/28/2019, presents to the emergency department for chief concerns of weakness and back pain.    OT comments  Ms Rorabaugh was seen for OT treatment on this date. Family member at bedside and pt reporting nausea/fatigue, reports has not eaten in >2 days. Pt pleasant and agreeable to attempt mobility. MIN A sup<>sit and rolling for toileting. Upon sitting pt reports increased nausea - requires emesis bowl and produces thick white secretions and belching - RN notified. MAX A for LBD seated EOB. CGA self-drinking seated EOB. Pt left in bed c bed alarm set and family member at bedside. Pt making progress toward goals. Pt continues to benefit from skilled OT services to maximize return to PLOF and minimize risk of future falls, injury, caregiver burden, and readmission. Will continue to follow POC. Discharge recommendation remains appropriate.    Follow Up Recommendations  SNF    Equipment Recommendations  3 in 1 bedside commode    Recommendations for Other Services      Precautions / Restrictions Precautions Precautions: Fall Restrictions Weight Bearing Restrictions: No       Mobility Bed Mobility Overal bed mobility: Needs Assistance Bed Mobility: Rolling;Supine to Sit;Sit to Supine Rolling: Min assist   Supine to sit: Min assist Sit to supine: Min assist      Transfers Overall transfer level: Needs assistance Equipment used: Rolling walker (2 wheeled) Transfers: Sit to/from  Stand Sit to Stand: Min assist;+2 physical assistance              Balance Overall balance assessment: Needs assistance Sitting-balance support: Feet supported;Single extremity supported Sitting balance-Leahy Scale: Fair     Standing balance support: Bilateral upper extremity supported Standing balance-Leahy Scale: Poor        ADL either performed or assessed with clinical judgement   ADL Overall ADL's : Needs assistance/impaired         General ADL Comments: MAX A for LBD seated EOB. CGA self-drinking seated EOB. MIN A x2 for ADL t/f                Cognition Arousal/Alertness: Awake/alert Behavior During Therapy: WFL for tasks assessed/performed Overall Cognitive Status: Within Functional Limits for tasks assessed            General Comments: pleasant, reports fatigue from not eating >2 days         Exercises Exercises: Other exercises Other Exercises Other Exercises: Pt and family educated re: d/c recs, importance of mobility, ECS, falls preventions Other Exercises: LBD, UBD, toileting, sup<>sit, sit<>stand, sitting/standing balance/tolerance           Pertinent Vitals/ Pain       Pain Assessment: Faces Faces Pain Scale: Hurts little more Pain Location: back pain, nausea Pain Descriptors / Indicators: Discomfort Pain Intervention(s): Limited activity within patient's tolerance;Repositioned;Other (comment) (RN notified nauseated)         Frequency  Min 2X/week        Progress Toward Goals  OT Goals(current goals can now be found in the care plan section)  Progress towards OT goals: Progressing toward goals  Acute Rehab OT Goals  Patient Stated Goal: pt hopes to improve enough to go home OT Goal Formulation: With patient Time For Goal Achievement: 10/13/20 Potential to Achieve Goals: Good ADL Goals Pt Will Perform Grooming: with modified independence;standing Pt Will Transfer to Toilet: with modified independence;ambulating Pt Will  Perform Toileting - Clothing Manipulation and hygiene: with modified independence;sit to/from stand  Plan Discharge plan remains appropriate;Frequency remains appropriate       AM-PAC OT "6 Clicks" Daily Activity     Outcome Measure   Help from another person eating meals?: None Help from another person taking care of personal grooming?: A Little Help from another person toileting, which includes using toliet, bedpan, or urinal?: A Lot Help from another person bathing (including washing, rinsing, drying)?: A Lot Help from another person to put on and taking off regular upper body clothing?: A Little Help from another person to put on and taking off regular lower body clothing?: A Lot 6 Click Score: 16    End of Session    OT Visit Diagnosis: Unsteadiness on feet (R26.81);Muscle weakness (generalized) (M62.81);Pain Pain - part of body:  (back)   Activity Tolerance Patient tolerated treatment well (limited by nausea)   Patient Left in bed;with call bell/phone within reach;with bed alarm set;with family/visitor present   Nurse Communication Other (comment) (nausea)        Time: 5859-2924 OT Time Calculation (min): 25 min  Charges: OT General Charges $OT Visit: 1 Visit OT Treatments $Self Care/Home Management : 8-22 mins $Therapeutic Activity: 8-22 mins  Dessie Coma, M.S. OTR/L  10/05/20, 11:10 AM  ascom [phone]

## 2020-10-06 ENCOUNTER — Inpatient Hospital Stay
Admission: EM | Admit: 2020-10-06 | Discharge: 2020-10-21 | DRG: 659 | Disposition: A | Discharge status: Skilled Nursing Facility | Payer: Medicare HMO | Source: Skilled Nursing Facility | Attending: Internal Medicine | Admitting: Internal Medicine

## 2020-10-06 ENCOUNTER — Other Ambulatory Visit: Payer: Self-pay

## 2020-10-06 ENCOUNTER — Inpatient Hospital Stay: Payer: Medicare HMO

## 2020-10-06 ENCOUNTER — Emergency Department: Payer: Medicare HMO

## 2020-10-06 DIAGNOSIS — E876 Hypokalemia: Secondary | ICD-10-CM | POA: Diagnosis present

## 2020-10-06 DIAGNOSIS — N1330 Unspecified hydronephrosis: Secondary | ICD-10-CM | POA: Diagnosis not present

## 2020-10-06 DIAGNOSIS — R1012 Left upper quadrant pain: Secondary | ICD-10-CM | POA: Diagnosis not present

## 2020-10-06 DIAGNOSIS — J449 Chronic obstructive pulmonary disease, unspecified: Secondary | ICD-10-CM | POA: Diagnosis not present

## 2020-10-06 DIAGNOSIS — Z7401 Bed confinement status: Secondary | ICD-10-CM | POA: Diagnosis not present

## 2020-10-06 DIAGNOSIS — I69354 Hemiplegia and hemiparesis following cerebral infarction affecting left non-dominant side: Secondary | ICD-10-CM

## 2020-10-06 DIAGNOSIS — R488 Other symbolic dysfunctions: Secondary | ICD-10-CM | POA: Diagnosis not present

## 2020-10-06 DIAGNOSIS — I4510 Unspecified right bundle-branch block: Secondary | ICD-10-CM | POA: Diagnosis present

## 2020-10-06 DIAGNOSIS — Z8719 Personal history of other diseases of the digestive system: Secondary | ICD-10-CM | POA: Diagnosis not present

## 2020-10-06 DIAGNOSIS — I129 Hypertensive chronic kidney disease with stage 1 through stage 4 chronic kidney disease, or unspecified chronic kidney disease: Secondary | ICD-10-CM | POA: Diagnosis not present

## 2020-10-06 DIAGNOSIS — Z9981 Dependence on supplemental oxygen: Secondary | ICD-10-CM | POA: Diagnosis not present

## 2020-10-06 DIAGNOSIS — Z853 Personal history of malignant neoplasm of breast: Secondary | ICD-10-CM

## 2020-10-06 DIAGNOSIS — Z79899 Other long term (current) drug therapy: Secondary | ICD-10-CM | POA: Diagnosis not present

## 2020-10-06 DIAGNOSIS — N183 Chronic kidney disease, stage 3 unspecified: Secondary | ICD-10-CM | POA: Diagnosis not present

## 2020-10-06 DIAGNOSIS — M810 Age-related osteoporosis without current pathological fracture: Secondary | ICD-10-CM | POA: Diagnosis present

## 2020-10-06 DIAGNOSIS — Z95828 Presence of other vascular implants and grafts: Secondary | ICD-10-CM

## 2020-10-06 DIAGNOSIS — K8020 Calculus of gallbladder without cholecystitis without obstruction: Secondary | ICD-10-CM | POA: Diagnosis not present

## 2020-10-06 DIAGNOSIS — R110 Nausea: Secondary | ICD-10-CM | POA: Diagnosis not present

## 2020-10-06 DIAGNOSIS — R140 Abdominal distension (gaseous): Secondary | ICD-10-CM | POA: Diagnosis not present

## 2020-10-06 DIAGNOSIS — K311 Adult hypertrophic pyloric stenosis: Secondary | ICD-10-CM | POA: Diagnosis present

## 2020-10-06 DIAGNOSIS — E039 Hypothyroidism, unspecified: Secondary | ICD-10-CM | POA: Diagnosis present

## 2020-10-06 DIAGNOSIS — Z4682 Encounter for fitting and adjustment of non-vascular catheter: Secondary | ICD-10-CM | POA: Diagnosis not present

## 2020-10-06 DIAGNOSIS — R109 Unspecified abdominal pain: Secondary | ICD-10-CM | POA: Diagnosis not present

## 2020-10-06 DIAGNOSIS — K551 Chronic vascular disorders of intestine: Secondary | ICD-10-CM | POA: Diagnosis present

## 2020-10-06 DIAGNOSIS — Z905 Acquired absence of kidney: Secondary | ICD-10-CM

## 2020-10-06 DIAGNOSIS — R2681 Unsteadiness on feet: Secondary | ICD-10-CM | POA: Diagnosis not present

## 2020-10-06 DIAGNOSIS — I4891 Unspecified atrial fibrillation: Secondary | ICD-10-CM | POA: Diagnosis present

## 2020-10-06 DIAGNOSIS — I2510 Atherosclerotic heart disease of native coronary artery without angina pectoris: Secondary | ICD-10-CM | POA: Diagnosis present

## 2020-10-06 DIAGNOSIS — M419 Scoliosis, unspecified: Secondary | ICD-10-CM | POA: Diagnosis not present

## 2020-10-06 DIAGNOSIS — Z7189 Other specified counseling: Secondary | ICD-10-CM

## 2020-10-06 DIAGNOSIS — N12 Tubulo-interstitial nephritis, not specified as acute or chronic: Secondary | ICD-10-CM | POA: Diagnosis not present

## 2020-10-06 DIAGNOSIS — J9811 Atelectasis: Secondary | ICD-10-CM | POA: Diagnosis present

## 2020-10-06 DIAGNOSIS — E440 Moderate protein-calorie malnutrition: Secondary | ICD-10-CM | POA: Diagnosis present

## 2020-10-06 DIAGNOSIS — Z789 Other specified health status: Secondary | ICD-10-CM | POA: Diagnosis not present

## 2020-10-06 DIAGNOSIS — I10 Essential (primary) hypertension: Secondary | ICD-10-CM | POA: Diagnosis not present

## 2020-10-06 DIAGNOSIS — N136 Pyonephrosis: Principal | ICD-10-CM | POA: Diagnosis present

## 2020-10-06 DIAGNOSIS — I480 Paroxysmal atrial fibrillation: Secondary | ICD-10-CM | POA: Diagnosis present

## 2020-10-06 DIAGNOSIS — Z681 Body mass index (BMI) 19 or less, adult: Secondary | ICD-10-CM | POA: Diagnosis not present

## 2020-10-06 DIAGNOSIS — Z7901 Long term (current) use of anticoagulants: Secondary | ICD-10-CM

## 2020-10-06 DIAGNOSIS — N179 Acute kidney failure, unspecified: Secondary | ICD-10-CM | POA: Diagnosis present

## 2020-10-06 DIAGNOSIS — K3189 Other diseases of stomach and duodenum: Secondary | ICD-10-CM | POA: Diagnosis not present

## 2020-10-06 DIAGNOSIS — K209 Esophagitis, unspecified without bleeding: Secondary | ICD-10-CM | POA: Diagnosis present

## 2020-10-06 DIAGNOSIS — Z515 Encounter for palliative care: Secondary | ICD-10-CM

## 2020-10-06 DIAGNOSIS — N1832 Chronic kidney disease, stage 3b: Secondary | ICD-10-CM | POA: Diagnosis present

## 2020-10-06 DIAGNOSIS — K315 Obstruction of duodenum: Secondary | ICD-10-CM | POA: Diagnosis not present

## 2020-10-06 DIAGNOSIS — R1084 Generalized abdominal pain: Secondary | ICD-10-CM

## 2020-10-06 DIAGNOSIS — Z8249 Family history of ischemic heart disease and other diseases of the circulatory system: Secondary | ICD-10-CM

## 2020-10-06 DIAGNOSIS — K56609 Unspecified intestinal obstruction, unspecified as to partial versus complete obstruction: Secondary | ICD-10-CM | POA: Diagnosis not present

## 2020-10-06 DIAGNOSIS — Z79890 Hormone replacement therapy (postmenopausal): Secondary | ICD-10-CM | POA: Diagnosis not present

## 2020-10-06 DIAGNOSIS — E785 Hyperlipidemia, unspecified: Secondary | ICD-10-CM | POA: Diagnosis not present

## 2020-10-06 DIAGNOSIS — F17210 Nicotine dependence, cigarettes, uncomplicated: Secondary | ICD-10-CM | POA: Diagnosis present

## 2020-10-06 DIAGNOSIS — K4190 Unilateral femoral hernia, without obstruction or gangrene, not specified as recurrent: Secondary | ICD-10-CM | POA: Diagnosis not present

## 2020-10-06 DIAGNOSIS — B370 Candidal stomatitis: Secondary | ICD-10-CM | POA: Diagnosis not present

## 2020-10-06 DIAGNOSIS — E43 Unspecified severe protein-calorie malnutrition: Secondary | ICD-10-CM | POA: Diagnosis present

## 2020-10-06 DIAGNOSIS — Z720 Tobacco use: Secondary | ICD-10-CM | POA: Diagnosis present

## 2020-10-06 DIAGNOSIS — Z20822 Contact with and (suspected) exposure to covid-19: Secondary | ICD-10-CM | POA: Diagnosis not present

## 2020-10-06 DIAGNOSIS — R0902 Hypoxemia: Secondary | ICD-10-CM | POA: Diagnosis not present

## 2020-10-06 DIAGNOSIS — R498 Other voice and resonance disorders: Secondary | ICD-10-CM | POA: Diagnosis not present

## 2020-10-06 DIAGNOSIS — K5900 Constipation, unspecified: Secondary | ICD-10-CM | POA: Diagnosis not present

## 2020-10-06 DIAGNOSIS — I959 Hypotension, unspecified: Secondary | ICD-10-CM | POA: Diagnosis not present

## 2020-10-06 DIAGNOSIS — M2550 Pain in unspecified joint: Secondary | ICD-10-CM | POA: Diagnosis not present

## 2020-10-06 DIAGNOSIS — J9611 Chronic respiratory failure with hypoxia: Secondary | ICD-10-CM | POA: Diagnosis not present

## 2020-10-06 DIAGNOSIS — K4090 Unilateral inguinal hernia, without obstruction or gangrene, not specified as recurrent: Secondary | ICD-10-CM | POA: Diagnosis not present

## 2020-10-06 DIAGNOSIS — Z823 Family history of stroke: Secondary | ICD-10-CM

## 2020-10-06 DIAGNOSIS — N200 Calculus of kidney: Secondary | ICD-10-CM | POA: Diagnosis not present

## 2020-10-06 DIAGNOSIS — K458 Other specified abdominal hernia without obstruction or gangrene: Secondary | ICD-10-CM | POA: Diagnosis present

## 2020-10-06 DIAGNOSIS — E038 Other specified hypothyroidism: Secondary | ICD-10-CM | POA: Diagnosis not present

## 2020-10-06 DIAGNOSIS — M6281 Muscle weakness (generalized): Secondary | ICD-10-CM | POA: Diagnosis not present

## 2020-10-06 DIAGNOSIS — K219 Gastro-esophageal reflux disease without esophagitis: Secondary | ICD-10-CM | POA: Diagnosis not present

## 2020-10-06 DIAGNOSIS — J90 Pleural effusion, not elsewhere classified: Secondary | ICD-10-CM | POA: Diagnosis not present

## 2020-10-06 DIAGNOSIS — K6389 Other specified diseases of intestine: Secondary | ICD-10-CM | POA: Diagnosis not present

## 2020-10-06 DIAGNOSIS — R52 Pain, unspecified: Secondary | ICD-10-CM | POA: Diagnosis not present

## 2020-10-06 DIAGNOSIS — N135 Crossing vessel and stricture of ureter without hydronephrosis: Secondary | ICD-10-CM | POA: Diagnosis not present

## 2020-10-06 DIAGNOSIS — B9620 Unspecified Escherichia coli [E. coli] as the cause of diseases classified elsewhere: Secondary | ICD-10-CM | POA: Diagnosis present

## 2020-10-06 DIAGNOSIS — M545 Low back pain, unspecified: Secondary | ICD-10-CM | POA: Diagnosis not present

## 2020-10-06 DIAGNOSIS — Q6211 Congenital occlusion of ureteropelvic junction: Secondary | ICD-10-CM | POA: Diagnosis not present

## 2020-10-06 DIAGNOSIS — R21 Rash and other nonspecific skin eruption: Secondary | ICD-10-CM | POA: Diagnosis not present

## 2020-10-06 LAB — CBC WITH DIFFERENTIAL/PLATELET
Abs Immature Granulocytes: 0.2 10*3/uL — ABNORMAL HIGH (ref 0.00–0.07)
Basophils Absolute: 0.1 10*3/uL (ref 0.0–0.1)
Basophils Relative: 0 %
Eosinophils Absolute: 0 10*3/uL (ref 0.0–0.5)
Eosinophils Relative: 0 %
HCT: 37.2 % (ref 36.0–46.0)
Hemoglobin: 12.4 g/dL (ref 12.0–15.0)
Immature Granulocytes: 1 %
Lymphocytes Relative: 9 %
Lymphs Abs: 1.3 10*3/uL (ref 0.7–4.0)
MCH: 30.8 pg (ref 26.0–34.0)
MCHC: 33.3 g/dL (ref 30.0–36.0)
MCV: 92.3 fL (ref 80.0–100.0)
Monocytes Absolute: 1.1 10*3/uL — ABNORMAL HIGH (ref 0.1–1.0)
Monocytes Relative: 7 %
Neutro Abs: 12.1 10*3/uL — ABNORMAL HIGH (ref 1.7–7.7)
Neutrophils Relative %: 83 %
Platelets: 415 10*3/uL — ABNORMAL HIGH (ref 150–400)
RBC: 4.03 MIL/uL (ref 3.87–5.11)
RDW: 13.2 % (ref 11.5–15.5)
WBC: 14.7 10*3/uL — ABNORMAL HIGH (ref 4.0–10.5)
nRBC: 0 % (ref 0.0–0.2)

## 2020-10-06 LAB — LACTIC ACID, PLASMA: Lactic Acid, Venous: 1 mmol/L (ref 0.5–1.9)

## 2020-10-06 LAB — URINALYSIS, COMPLETE (UACMP) WITH MICROSCOPIC
Bilirubin Urine: NEGATIVE
Glucose, UA: NEGATIVE mg/dL
Ketones, ur: NEGATIVE mg/dL
Nitrite: NEGATIVE
Protein, ur: 100 mg/dL — AB
RBC / HPF: >50 RBC/hpf — ABNORMAL HIGH (ref 0–5)
Specific Gravity, Urine: 1.016 (ref 1.005–1.030)
Squamous Epithelial / HPF: NONE SEEN (ref 0–5)
WBC, UA: >50 WBC/hpf — ABNORMAL HIGH (ref 0–5)
pH: 5 (ref 5.0–8.0)

## 2020-10-06 LAB — COMPREHENSIVE METABOLIC PANEL
ALT: 10 U/L (ref 0–44)
AST: 13 U/L — ABNORMAL LOW (ref 15–41)
Albumin: 2.4 g/dL — ABNORMAL LOW (ref 3.5–5.0)
Alkaline Phosphatase: 47 U/L (ref 38–126)
Anion gap: 10 (ref 5–15)
BUN: 27 mg/dL — ABNORMAL HIGH (ref 8–23)
CO2: 25 mmol/L (ref 22–32)
Calcium: 8.5 mg/dL — ABNORMAL LOW (ref 8.9–10.3)
Chloride: 97 mmol/L — ABNORMAL LOW (ref 98–111)
Creatinine, Ser: 1.56 mg/dL — ABNORMAL HIGH (ref 0.44–1.00)
GFR, Estimated: 33 mL/min — ABNORMAL LOW (ref >60)
Glucose, Bld: 124 mg/dL — ABNORMAL HIGH (ref 70–99)
Potassium: 3.9 mmol/L (ref 3.5–5.1)
Sodium: 132 mmol/L — ABNORMAL LOW (ref 135–145)
Total Bilirubin: 0.5 mg/dL (ref 0.3–1.2)
Total Protein: 6.3 g/dL — ABNORMAL LOW (ref 6.5–8.1)

## 2020-10-06 LAB — RESP PANEL BY RT PCR (RSV, FLU A&B, COVID)
Influenza A by PCR: NEGATIVE
Influenza B by PCR: NEGATIVE
Respiratory Syncytial Virus by PCR: NEGATIVE
SARS Coronavirus 2 by RT PCR: NEGATIVE

## 2020-10-06 LAB — TYPE AND SCREEN
ABO/RH(D): O POS
Antibody Screen: POSITIVE

## 2020-10-06 LAB — LIPASE, BLOOD: Lipase: 45 U/L (ref 11–51)

## 2020-10-06 LAB — PROTIME-INR
INR: 1.1 (ref 0.8–1.2)
Prothrombin Time: 13.8 seconds (ref 11.4–15.2)

## 2020-10-06 MED ORDER — SUCRALFATE 1 GM/10ML PO SUSP
1.0000 g | Freq: Three times a day (TID) | ORAL | Status: DC
  Administered 2020-10-06 – 2020-10-07 (×3): 1 g via ORAL
  Filled 2020-10-06 (×6): qty 10

## 2020-10-06 MED ORDER — MIDAZOLAM HCL 2 MG/2ML IJ SOLN
2.0000 mg | Freq: Once | INTRAMUSCULAR | Status: AC
  Administered 2020-10-06: 2 mg via INTRAVENOUS
  Filled 2020-10-06: qty 2

## 2020-10-06 MED ORDER — IOHEXOL 9 MG/ML PO SOLN
500.0000 mL | Freq: Two times a day (BID) | ORAL | Status: DC | PRN
  Administered 2020-10-06: 500 mL via ORAL

## 2020-10-06 MED ORDER — PANTOPRAZOLE SODIUM 40 MG IV SOLR
40.0000 mg | Freq: Two times a day (BID) | INTRAVENOUS | Status: DC
  Administered 2020-10-06 – 2020-10-21 (×30): 40 mg via INTRAVENOUS
  Filled 2020-10-06 (×32): qty 40

## 2020-10-06 MED ORDER — IPRATROPIUM-ALBUTEROL 0.5-2.5 (3) MG/3ML IN SOLN
3.0000 mL | Freq: Two times a day (BID) | RESPIRATORY_TRACT | Status: DC
  Administered 2020-10-06 – 2020-10-12 (×12): 3 mL via RESPIRATORY_TRACT
  Filled 2020-10-06 (×15): qty 3

## 2020-10-06 MED ORDER — FLUTICASONE FUROATE-VILANTEROL 200-25 MCG/INH IN AEPB: 1.0000 | INHALATION_SPRAY | Freq: Every day | RESPIRATORY_TRACT | Status: DC

## 2020-10-06 MED ORDER — ONDANSETRON HCL 4 MG/2ML IJ SOLN
INTRAMUSCULAR | Status: AC
  Administered 2020-10-06: 4 mg via INTRAVENOUS
  Filled 2020-10-06: qty 2

## 2020-10-06 MED ORDER — APIXABAN 2.5 MG PO TABS
2.5000 mg | ORAL_TABLET | Freq: Two times a day (BID) | ORAL | Status: DC
  Filled 2020-10-06 (×2): qty 1

## 2020-10-06 MED ORDER — ONDANSETRON HCL 4 MG/2ML IJ SOLN: 4.0000 mg | Freq: Once | INTRAMUSCULAR | Status: AC

## 2020-10-06 MED ORDER — ONDANSETRON HCL 4 MG/2ML IJ SOLN: 4.0000 mg | Freq: Once | INTRAMUSCULAR | Status: DC

## 2020-10-06 MED ORDER — LACTATED RINGERS IV SOLN: INTRAVENOUS | Status: AC

## 2020-10-06 MED ORDER — NICOTINE 21 MG/24HR TD PT24
21.0000 mg | MEDICATED_PATCH | Freq: Every day | TRANSDERMAL | Status: DC
  Administered 2020-10-06 – 2020-10-07 (×2): 21 mg via TRANSDERMAL
  Filled 2020-10-06 (×3): qty 1

## 2020-10-06 MED ORDER — ENSURE ENLIVE PO LIQD
237.0000 mL | Freq: Two times a day (BID) | ORAL | Status: DC
  Administered 2020-10-07: 10:00:00 237 mL via ORAL

## 2020-10-06 MED ORDER — ACETAMINOPHEN 325 MG PO TABS: 650.0000 mg | ORAL_TABLET | Freq: Four times a day (QID) | ORAL | Status: DC | PRN

## 2020-10-06 MED ORDER — ALBUTEROL SULFATE HFA 108 (90 BASE) MCG/ACT IN AERS
2.0000 | INHALATION_SPRAY | Freq: Four times a day (QID) | RESPIRATORY_TRACT | Status: DC | PRN
  Filled 2020-10-06: qty 6.7

## 2020-10-06 MED ORDER — METOPROLOL TARTRATE 50 MG PO TABS
50.0000 mg | ORAL_TABLET | Freq: Two times a day (BID) | ORAL | Status: DC
  Administered 2020-10-06: 21:00:00 50 mg via ORAL
  Filled 2020-10-06: qty 1

## 2020-10-06 MED ORDER — LEVOTHYROXINE SODIUM 50 MCG PO TABS
150.0000 ug | ORAL_TABLET | Freq: Every day | ORAL | Status: DC
  Administered 2020-10-07: 150 ug via ORAL
  Filled 2020-10-06: qty 1

## 2020-10-06 MED ORDER — SODIUM CHLORIDE 0.9 % IV SOLN
8.0000 mg/h | INTRAVENOUS | Status: DC
  Administered 2020-10-06: 8 mg/h via INTRAVENOUS
  Filled 2020-10-06 (×2): qty 80

## 2020-10-06 MED ORDER — SODIUM CHLORIDE 0.9 % IV BOLUS
500.0000 mL | Freq: Once | INTRAVENOUS | Status: AC
  Administered 2020-10-06: 500 mL via INTRAVENOUS

## 2020-10-06 MED ORDER — SODIUM CHLORIDE 0.9 % IV SOLN
80.0000 mg | Freq: Once | INTRAVENOUS | Status: AC
  Administered 2020-10-06: 80 mg via INTRAVENOUS
  Filled 2020-10-06: qty 80

## 2020-10-06 MED ORDER — MORPHINE SULFATE (PF) 4 MG/ML IV SOLN: 4.0000 mg | Freq: Once | INTRAVENOUS | Status: DC

## 2020-10-06 MED ORDER — FLUTICASONE FUROATE-VILANTEROL 200-25 MCG/INH IN AEPB
1.0000 | INHALATION_SPRAY | Freq: Every day | RESPIRATORY_TRACT | Status: DC
  Administered 2020-10-07 – 2020-10-21 (×15): 1 via RESPIRATORY_TRACT
  Filled 2020-10-06 (×2): qty 28

## 2020-10-06 NOTE — Assessment & Plan Note (Signed)
Urology paged and waiting for response Dr. Elenor Quinones.

## 2020-10-06 NOTE — ED Notes (Signed)
Attending provider at bedside

## 2020-10-06 NOTE — ED Notes (Signed)
Put son on phone with EDP.

## 2020-10-06 NOTE — ED Notes (Signed)
Pt denies NVD. Pt states does not remember LBM date. [facility] stated LBM 10d ago. Abdo nontender to palpation, bowel sounds heard all quadrants. Denies pain, burning with urination.

## 2020-10-06 NOTE — ED Notes (Signed)
Sister still at bedside. Son called to get update.

## 2020-10-06 NOTE — ED Notes (Signed)
Called CT to notify that pt has consumed 1 bottle of contrast. They will come pick her up soon. Sister at bedside. Pt sleeping.

## 2020-10-06 NOTE — ED Notes (Addendum)
Pt vomited medium amount of watery coffee ground emesis. Attempted NG tube placement per EDP verbal order but pt unable to tolerate. EDP contacted to make aware.

## 2020-10-06 NOTE — Assessment & Plan Note (Signed)
Thyroid studies wnl and we will continue levothyroxine and 150 mcg.

## 2020-10-06 NOTE — ED Notes (Signed)
X-ray at bedside

## 2020-10-06 NOTE — ED Notes (Signed)
Blood bank called to report that there were antibodies found in pt blood while performing type and screen. They wanted to know how many pregnancies pt has had and if pt has ever received blood transfusion. 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 Relayed all info to blood bank.

## 2020-10-06 NOTE — Assessment & Plan Note (Signed)
Dietitian consult. ?TPN.

## 2020-10-06 NOTE — Consult Note (Signed)
Cephas Darby, MD [address]  [place]  [place],  [place] [address]  Main: [phone]  Fax: [phone] Pager: [phone]   Consultation  Referring Provider:     No ref. provider found Primary Care Physician:  Valerie Roys, DO Primary Gastroenterologist:  Dr. Lucilla Lame         Reason for Consultation:   Duodenal compression, coffee-ground emesis  Date of Admission:  10/06/2020 Date of Consultation:  10/06/2020         HPI:   Cathy Solis is a 79 y.o. female medical history significant for hypothyroidism, hypertension, tobacco use disorder, COPD, CKD stage IIIb, solitary left kidney with chronic hydronephrosis (s/p right nephrectomy in 2008/2009), history of stroke, paroxysmal atrial fibrillation, chronic bilateral carotid artery stenosis.  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  She had E. coli UTI.  She was consulted by GI for isolated episode of hematemesis.  Patient was discharged home yesterday.  She returned to ER today secondary to Lack of bowel movement for 10 days, and new onset of abdominal distention, poor p.o. intake.  She underwent repeat CT Abdomen and pelvis without contrast today which revealed markedly distended stomach with extrinsic compression of the duodenum secondary to markedly dilated right renal pelvis and adjacent superior mesenteric artery.  Contrast was able to pass through duodenum and no evidence of gastric outlet obstruction.  GI is consulted due to duodenal compression  When I saw the patient on the floor, she states she feels terrible.  However, she denies abdominal pain.  She states she did not have bowel movement for last few days. NSAIDs: None  Antiplts/Anticoagulants/Anti thrombotics: Eliquis for history of A. fib  GI Procedures: Colonoscopy in 2015  Past Medical History:  Diagnosis Date  .  Breast cancer [facility]) 2006   Left breast, s/p radiation  . 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 Hypertension   . Kidney problem    Undeveloped R kidney  . 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 Personal history of tobacco use, presenting hazards to health 03/18/2016  . Stroke [facility])    residual left sided weakness  . Toe infection    followed by Dr. Jens Som    Past Surgical History:  Procedure Laterality Date  . ABDOMINAL HYSTERECTOMY    . BLADDER REPAIR    . BREAST LUMPECTOMY Left   . BREAST SURGERY     left  . CAROTID ENDARTERECTOMY  2008   left  . COLONOSCOPY  2008  . EYE SURGERY Right 2013   cataract  . KIDNEY SURGERY  1949    Current Facility-Administered Medications:  .  acetaminophen (TYLENOL) tablet 650 mg, 650 mg, Oral, Q6H PRN, Posey Pronto, Ekta V, MD .  albuterol (VENTOLIN HFA) 108 (90 Base) MCG/ACT inhaler 2 puff, 2 puff, Inhalation, Q6H PRN, Florina Ou V, MD .  apixaban (ELIQUIS) tablet 2.5 mg, 2.5 mg, Oral, BID, Patel, Ekta V, MD .  feeding supplement (ENSURE ENLIVE / ENSURE PLUS) liquid 237 mL, 237 mL, Oral, BID BM, Para Skeans, MD .  Derrill Memo ON 10/07/2020] fluticasone furoate-vilanterol (BREO ELLIPTA) 200-25 MCG/INH 1 puff, 1 puff, Inhalation, Daily, Oswald Hillock, RPH .  iohexol (OMNIPAQUE) 9 MG/ML oral solution 500 mL, 500 mL, Oral, BID PRN, Arta Silence, MD, 500 mL at 10/06/20 0923 .  ipratropium-albuterol (DUONEB)  0.5-2.5 (3) MG/3ML nebulizer solution 3 mL, 3 mL, Nebulization, BID, Posey Pronto, Gretta Cool, MD .  lactated ringers infusion, , Intravenous, Continuous, Para Skeans, MD, Last Rate: 50 mL/hr at 10/06/20 1429, New Bag at 10/06/20 1429 .  [START ON 10/07/2020] levothyroxine (SYNTHROID) tablet 150 mcg, 150 mcg, Oral, Daily, Posey Pronto, Ekta V, MD .  metoprolol tartrate (LOPRESSOR) tablet 50 mg, 50 mg, Oral, BID, Patel, Ekta V, MD .  nicotine (NICODERM CQ - dosed in mg/24 hours) patch 21 mg, 21 mg, Transdermal, Daily, Posey Pronto,  Ekta V, MD .  ondansetron [facility]) injection 4 mg, 4 mg, Intravenous, Once, Arta Silence, MD .  pantoprazole (PROTONIX) 80 mg in sodium chloride 0.9 % 100 mL (0.8 mg/mL) infusion, 8 mg/hr, Intravenous, Continuous, Siadecki, Sebastian, MD, Last Rate: 10 mL/hr at 10/06/20 1433, 8 mg/hr at 10/06/20 1433 .  pantoprazole (PROTONIX) injection 40 mg, 40 mg, Intravenous, Q12H, Patel, Ekta V, MD .  sucralfate (CARAFATE) 1 GM/10ML suspension 1 g, 1 g, Oral, TID WC & HS, Para Skeans, MD   Family History  Problem Relation Age of Onset  . Stroke Mother   . Heart disease Father   . Heart attack Maternal Grandfather      Social History   Tobacco Use  . Smoking status: Current Some Day Smoker    Packs/day: 0.50    Years: 30.00    Pack years: 15.00    Types: Cigarettes    Last attempt to quit: 01/08/2019    Years since quitting: 1.7  . Smokeless tobacco: Never Used  Vaping Use  . Vaping Use: Never used  Substance Use Topics  . Alcohol use: No  . Drug use: No    Allergies as of 10/06/2020 - Review Complete 10/06/2020  Allergen Reaction Noted  . Statins Other (See Comments) 11/02/2017  . Zetia [ezetimibe] Other (See Comments) 12/25/2017    Review of Systems:    All systems reviewed and negative except where noted in HPI.   Physical Exam:  Vital signs in last 24 hours: Temp:  [98 F (36.7 C)-98.6 F (37 C)] 98 F (36.7 C) (11/16 0821) Pulse Rate:  [38-99] 80 (11/16 1530) Resp:  [16-26] 18 (11/16 1530) BP: (104-162)/(58-105) 131/71 (11/16 1530) SpO2:  [91 %-100 %] 100 % (11/16 1530) Weight:  [51.3 kg] 51.3 kg (11/16 0822)   General: Minimally verbal, thin built Head:  Normocephalic and bitemporal wasting. Eyes:   No icterus.   Conjunctiva pink. PERRLA. Ears:  Normal auditory acuity. Neck:  Supple; no masses or thyroidomegaly Lungs: Respirations even and unlabored. Lungs clear to auscultation bilaterally.   No wheezes, crackles, or rhonchi.  Heart:  Regular rate and  rhythm;  Without murmur, clicks, rubs or gallops Abdomen:  Soft, diffusely distended, tympanic to percussion, nontender.  Hypoactive bowel sounds. No appreciable masses or hepatomegaly.  No rebound or guarding.  Rectal:  Not performed. Msk:  Symmetrical without gross deformities.  Generalized weakness  Extremities:  Without edema, cyanosis or clubbing. Neurologic:  Alert and oriented x2;   Skin:  Intact without significant lesions or rashes.  LAB RESULTS: CBC Latest Ref Rng & Units 10/06/2020 10/04/2020 10/03/2020  WBC 4.0 - 10.5 K/uL 14.7(H) 7.7 8.6  Hemoglobin 12.0 - 15.0 g/dL 12.4 11.5(L) 13.1  Hematocrit 36 - 46 % 37.2 35.2(L) 38.8  Platelets 150 - 400 K/uL 415(H) 343 416(H)    BMET BMP Latest Ref Rng & Units 10/06/2020 10/04/2020 10/03/2020  Glucose 70 - 99 mg/dL 124(H) 111(H) 150(H)  BUN  8 - 23 mg/dL 27(H) 30(H) 36(H)  Creatinine 0.44 - 1.00 mg/dL 1.56(H) 1.56(H) 1.60(H)  BUN/Creat Ratio 12 - 28 - - -  Sodium 135 - 145 mmol/L 132(L) 132(L) 131(L)  Potassium 3.5 - 5.1 mmol/L 3.9 3.8 3.5  Chloride 98 - 111 mmol/L 97(L) 100 99  CO2 22 - 32 mmol/L 25 24 20(L)  Calcium 8.9 - 10.3 mg/dL 8.5(L) 8.6(L) 8.6(L)    LFT Hepatic Function Latest Ref Rng & Units 10/06/2020 10/04/2020 07/21/2020  Total Protein 6.5 - 8.1 g/dL 6.3(L) 5.7(L) 7.2  Albumin 3.5 - 5.0 g/dL 2.4(L) 2.1(L) 3.7  AST 15 - 41 U/L 13(L) 13(L) 13  ALT 0 - 44 U/L '10 9 6  ' Alk Phosphatase 38 - 126 U/L 47 45 90  Total Bilirubin 0.3 - 1.2 mg/dL 0.5 0.3 <0.2  Bilirubin, Direct 0.0 - 0.2 mg/dL - <0.1 -     STUDIES: CT ABDOMEN PELVIS WO CONTRAST  Result Date: 10/06/2020 CLINICAL DATA:  Abdominal distension EXAM: CT ABDOMEN AND PELVIS WITHOUT CONTRAST TECHNIQUE: Multidetector CT imaging of the abdomen and pelvis was performed following the standard protocol without IV contrast. COMPARISON:  CT 09/27/2020 FINDINGS: Lower chest: New small-to-moderate right pleural effusion with right basilar atelectasis and consolidation.  There is high-density material within the atelectatic right lower lobe suggesting aspirated oral contrast (series 2, image 1). New small left pleural effusion with associated mild compressive atelectasis. Heart size within normal limits. Coronary artery calcification. Hepatobiliary: Unenhanced liver is within normal limits. Status post cholecystectomy. Pancreas: No obvious abnormality. Spleen: Unremarkable. Adrenals/Urinary Tract: Stable appearance of the bilateral adrenal glands. Severe left-sided hydronephrosis with marked dilation of the right renal pelvis with abrupt caliber transition at the left ureteropelvic junction. Prominent urothelial thickening. Multiple calcifications within the left renal collecting system are unchanged and may represent vascular calcifications or small stones. Distal left ureter is nondilated. Urinary bladder is moderately distended. Surgically absent right kidney. Stomach/Bowel: Mildly thickened appearance of the visualized distal esophagus. The stomach is markedly dilated. Oral contrast is present within the stomach and small bowel. No evidence of gastric outlet obstruction. The distal duodenum is extrinsically compressed by the markedly dilated right renal pelvis and adjacent superior mesenteric artery (series 2, image 34). There is a right femoral hernia containing a loop of small bowel. No abnormal upstream dilatation of small bowel to suggest obstruction. There appears to be a conglomeration of completely collapsed small bowel loops within the right lower quadrant (series 2, image 54). The oral contrast has not reached the level of the hernia sac at the time of scanning. Large volume of stool within the colon. No colonic inflammatory changes are seen. Vascular/Lymphatic: Tortuous, ectatic abdominal aorta with extensive atherosclerotic calcifications throughout the aortoiliac axis. Vascular structures are suboptimally evaluated in the absence of IV contrast. No lymphadenopathy  identified. Reproductive: Status post hysterectomy. No adnexal masses. Other: Interval development of small volume ascites. No organized abdominopelvic fluid collection. No pneumoperitoneum. Musculoskeletal: Lumbar dextrocurvature with advanced multilevel lumbar spondylosis. No new or acute osseous findings. IMPRESSION: 1. Markedly distended stomach. Findings are likely related to duodenal obstruction. The distal duodenum is markedly narrowed between the superior mesenteric artery and the severely dilated left renal pelvis. 2. Persistent severe left-sided hydronephrosis with marked dilation of the right renal pelvis with abrupt caliber transition at the left ureteropelvic junction. Prominent urothelial thickening. 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 3. Interval development of small volume ascites. 4. Right femoral hernia containing a loop of small bowel. No abnormal upstream  dilatation of small bowel to suggest obstruction. The oral contrast has not reached the level of the hernia sac at the time of scanning. Consider short interval repeat scan to evaluate transit through this location. 5. New small-to-moderate right pleural effusion with right basilar atelectasis and consolidation. There is high-density material within the atelectatic right lower lobe suggesting aspirated oral contrast. 6. New small left pleural effusion with associated mild compressive atelectasis. 7. Mildly thickened appearance of the visualized distal esophagus, which may reflect esophagitis. 8. Large volume of stool within the colon. 9. Aortic atherosclerosis. (ICD10-I70.0). Electronically Signed   By: Davina Poke D.O.   On: 10/06/2020 11:41   DG Abd Portable 1 View  Result Date: 10/06/2020 CLINICAL DATA:  NG placement EXAM: PORTABLE ABDOMEN - 1 VIEW COMPARISON:  None. FINDINGS: Enteric tube passes into the stomach. Bowel gas pattern is not well evaluated. Dextroscoliosis of upper lumbar spine. IMPRESSION: Enteric tube  within the stomach. Electronically Signed   By: Macy Mis M.D.   On: 10/06/2020 14:59      Impression / Plan:   Cathy Solis is a 79 y.o. 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 coli UTI presented with abdominal distention secondary to extrinsic duodenal compression from markedly dilated right renal pelvis   Duodenal compression: Extrinsic secondary to markedly dilated right renal pelvis and left chronic UPJ stenosis.   Strict n.p.o for next 24 hours.  Recommend NG tube for decompression and low intermittent suction  Protonix 40 mg IV twice daily  Monitor CBC daily  Do not recommend upper endoscopy at this time  Recommend consultation with urology for stent placement and decompression of the hydronephrosis.  Recommend to hold Eliquis in anticipation of any urologic intervention  Constipation Recommend Dulcolax suppository   Thank you for involving me in the care of this patient.      LOS: 0 days   Sherri Sear, MD  10/06/2020, 4:09 PM   Note: This dictation was prepared with Dragon dictation along with smaller phrase technology. Any transcriptional errors that result from this process are unintentional.

## 2020-10-06 NOTE — ED Notes (Signed)
EDP at bedside. DRE performed.

## 2020-10-06 NOTE — ED Notes (Signed)
Sister at bedside.

## 2020-10-06 NOTE — H&P (Signed)
History and Physical    Cathy Solis:[id number] DOB: Oct 30, 1941 DOA: 10/06/2020  PCP: Valerie Roys, DO    Patient coming from:  Skilled nursing facility.   Chief Complaint:  Abdominal Pain.   HPI: Cathy Solis is a 79 y.o. 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  Patient was recently hospitalized on 09/27/2020 and discharged on 10/05/2020.  Patient during recent admission was admitted for pyelonephritis, and discharged with antibiotics and outpatient follow-up with urologist, cardiologist and primary care provider.  She was found to have acute [place] and AKI with culture showing E. coli and discharged on p.o. Levaquin.  Patient was also seen by cardiologist for A. fib RVR and started on Eliquis for CHADS2 score of 6. Prior to previous admission patient was not on home O2 was discharged on 2 L by nasal cannula.  Sister [name] was involved in patient's care and her son [name].  Patient's creatinine on discharge was 1.56 and creatinine today is 1.56.  I will hold her ACE inhibitor, Team MD to restart and uptitrate as deemed necessary. ED Course:  Vitals:   10/06/20 1430 10/06/20 1500 10/06/20 1530 10/06/20 1620  BP: 123/65 128/72 131/71 (!) 143/64  Pulse: 89 83 80 72  Resp: (!) 23 17 18 16   Temp:      TempSrc:      SpO2: 99% 99% 100% 94%  Weight:      Height:        Review of Systems:  Review of Systems  Unable to perform ROS: Other (Sedated.)     Past Medical History:  Diagnosis Date  . Breast cancer [facility]) 2006   Left breast, s/p radiation  . 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 Hypertension   . Kidney problem    Undeveloped R  kidney  . 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 Personal history of tobacco use, presenting hazards to health 03/18/2016  . Stroke [facility])    residual left sided weakness  . Toe infection    followed by Dr. Jens Som    Past Surgical History:  Procedure Laterality Date  . ABDOMINAL HYSTERECTOMY    . BLADDER REPAIR    . BREAST LUMPECTOMY Left   . BREAST SURGERY     left  . CAROTID ENDARTERECTOMY  2008   left  . COLONOSCOPY  2008  . EYE SURGERY Right 2013   cataract  . [place]     reports that she has been smoking cigarettes. She has a 15.00 pack-year smoking history. She has never used smokeless tobacco. She reports that she does not drink alcohol and does not use drugs.  Allergies  Allergen Reactions  . Statins Other (See Comments)    Myalgias  . Zetia [Ezetimibe] Other (See Comments)    myalgias    Family History  Problem Relation Age of Onset  . Stroke Mother   . Heart disease Father   . Heart attack Maternal Grandfather     Prior to Admission medications   Medication Sig Start Date End Date Taking? Authorizing Provider  acetaminophen (TYLENOL) 325 MG tablet Take 2 tablets (650 mg total) by mouth every 6 (  six) hours as needed for mild pain (or Fever >/= 101). 10/05/20   Jennye Boroughs, MD  albuterol (VENTOLIN HFA) 108 (90 Base) MCG/ACT inhaler Inhale 2 puffs into the lungs every 6 (six) hours as needed for wheezing or shortness of breath. 07/21/20   Johnson, Megan P, DO  apixaban (ELIQUIS) 2.5 MG TABS tablet Take 1 tablet (2.5 mg total) by mouth 2 (two) times daily. 10/05/20   Jennye Boroughs, MD  feeding supplement (ENSURE ENLIVE / ENSURE PLUS) LIQD Take 237 mLs by mouth 2 (two) times daily between meals. 10/05/20 11/04/20  Jennye Boroughs, MD  fluticasone furoate-vilanterol (BREO ELLIPTA) 200-25 MCG/INH AEPB Inhale 1 puff into the lungs daily. 10/06/20   Jennye Boroughs, MD  ipratropium-albuterol (DUONEB) 0.5-2.5 (3)  MG/3ML SOLN Take 3 mLs by nebulization 2 (two) times daily for 4 days. DuoNeb nebulizer treatments twice a day for 4 days followed by as needed for severe shortness of breath Patient taking differently: Take 3 mLs by nebulization 2 (two) times daily.  01/27/20 09/27/20  Park Liter P, DO  levothyroxine (SYNTHROID) 150 MCG tablet Take 1 tablet (150 mcg total) by mouth daily. 09/06/20   Johnson, Megan P, DO  lisinopril (ZESTRIL) 10 MG tablet Take 1 tablet (10 mg total) by mouth daily. TAKE 1 TABLET BY MOUTH EVERY DAY 07/21/20   Park Liter P, DO  metoprolol tartrate (LOPRESSOR) 50 MG tablet Take 1 tablet (50 mg total) by mouth 2 (two) times daily. 10/05/20   Jennye Boroughs, MD  nicotine (NICODERM CQ - DOSED IN MG/24 HOURS) 21 mg/24hr patch Place 1 patch (21 mg total) onto the skin daily. 10/06/20   Jennye Boroughs, MD    Physical Exam: Vitals:   10/06/20 1430 10/06/20 1500 10/06/20 1530 10/06/20 1620  BP: 123/65 128/72 131/71 (!) 143/64  Pulse: 89 83 80 72  Resp: (!) 23 17 18 16   Temp:      TempSrc:      SpO2: 99% 99% 100% 94%  Weight:      Height:        Physical Exam Vitals reviewed.  Constitutional:      Appearance: She is ill-appearing.  HENT:     Head: Normocephalic and atraumatic.  Eyes:     Pupils: Pupils are equal, round, and reactive to light.  Cardiovascular:     Rate and Rhythm: Normal rate. Rhythm irregular.  Pulmonary:     Effort: Pulmonary effort is normal. No respiratory distress.     Breath sounds: Normal breath sounds.  Abdominal:     General: Bowel sounds are decreased. There is distension.     Palpations: Abdomen is soft.     Tenderness: There is generalized abdominal tenderness.     Hernia: No hernia is present.  Neurological:     General: No focal deficit present.     Mental Status: She is alert.  Psychiatric:     Comments: Sedated for NGT.      Labs on Admission: I have personally reviewed following labs and imaging studies  CBC: Recent Labs   Lab 09/30/20 0853 09/30/20 0853 09/30/20 0940 10/03/20 0105 10/03/20 0342 10/04/20 0613 10/06/20 0828  WBC ORMOD   < > 9.6 9.8 8.6 7.7 14.7*  NEUTROABS PENDING  --  7.8*  --   --  5.6 12.1*  HGB ORMOD   < > 12.5 12.0 13.1 11.5* 12.4  HCT ORMOD   < > 37.5 35.9* 38.8 35.2* 37.2  MCV ORMOD   < > 90.6 90.4 89.8  92.1 92.3  PLT ORMOD   < > 322 382 416* 343 415*   < > = values in this interval not displayed.   Basic Metabolic Panel: Recent Labs  Lab 10/01/20 0819 10/02/20 0430 10/03/20 0105 10/04/20 0613 10/06/20 0828  NA 132* 132* 131* 132* 132*  K 3.7 3.6 3.5 3.8 3.9  CL 105 102 99 100 97*  CO2 19* 22 20* 24 25  GLUCOSE 105* 104* 150* 111* 124*  BUN 31* 28* 36* 30* 27*  CREATININE 1.48* 1.35* 1.60* 1.56* 1.56*  CALCIUM 8.7* 8.6* 8.6* 8.6* 8.5*  MG 1.8  --   --   --   --   PHOS 2.4*  --   --   --   --    GFR: Estimated Creatinine Clearance: 22.9 mL/min (A) (by C-G formula based on SCr of 1.56 mg/dL (H)). Liver Function Tests: Recent Labs  Lab 10/04/20 0613 10/06/20 0828  AST 13* 13*  ALT 9 10  ALKPHOS 45 47  BILITOT 0.3 0.5  PROT 5.7* 6.3*  ALBUMIN 2.1* 2.4*   Recent Labs  Lab 10/06/20 0828  LIPASE 45   No results for input(s): AMMONIA in the last 168 hours. Coagulation Profile: Recent Labs  Lab 10/06/20 0838  INR 1.1   Cardiac Enzymes: No results for input(s): CKTOTAL, CKMB, CKMBINDEX, TROPONINI in the last 168 hours. BNP (last 3 results) No results for input(s): PROBNP in the last 8760 hours. HbA1C: No results for input(s): HGBA1C in the last 72 hours. CBG: No results for input(s): GLUCAP in the last 168 hours. Lipid Profile: No results for input(s): CHOL, HDL, LDLCALC, TRIG, CHOLHDL, LDLDIRECT in the last 72 hours. Thyroid Function Tests: No results for input(s): TSH, T4TOTAL, FREET4, T3FREE, THYROIDAB in the last 72 hours. Anemia Panel: No results for input(s): VITAMINB12, FOLATE, FERRITIN, TIBC, IRON, RETICCTPCT in the last 72 hours. Urine  analysis:    Component Value Date/Time   COLORURINE YELLOW (A) 10/06/2020 1350   APPEARANCEUR TURBID (A) 10/06/2020 1350   APPEARANCEUR Cloudy (A) 01/04/2019 0928   LABSPEC 1.016 10/06/2020 1350   LABSPEC 1.010 01/03/2014 1104   PHURINE 5.0 10/06/2020 1350   GLUCOSEU NEGATIVE 10/06/2020 1350   GLUCOSEU Negative 01/03/2014 1104   HGBUR LARGE (A) 10/06/2020 1350   BILIRUBINUR NEGATIVE 10/06/2020 1350   BILIRUBINUR Negative 01/04/2019 0928   BILIRUBINUR Negative 01/03/2014 1104   KETONESUR NEGATIVE 10/06/2020 1350   PROTEINUR 100 (A) 10/06/2020 1350   UROBILINOGEN 0.2 02/14/2013 1345   NITRITE NEGATIVE 10/06/2020 1350   LEUKOCYTESUR MODERATE (A) 10/06/2020 1350   LEUKOCYTESUR Negative 01/03/2014 1104    Intake/Output Summary (Last 24 hours) at 10/06/2020 1647 Last data filed at 10/06/2020 1425 Gross per 24 hour  Intake 600 ml  Output 300 ml  Net 300 ml   Lab Results  Component Value Date   CREATININE 1.56 (H) 10/06/2020   CREATININE 1.56 (H) 10/04/2020   CREATININE 1.60 (H) 10/03/2020    COVID-19 Labs  No results for input(s): DDIMER, FERRITIN, LDH, CRP in the last 72 hours.  Lab Results  Component Value Date   SARSCOV2NAA NEGATIVE 10/06/2020   [place] NEGATIVE 10/05/2020   [place] NEGATIVE 09/27/2020    Radiological Exams on Admission: CT ABDOMEN PELVIS WO CONTRAST  Result Date: 10/06/2020 CLINICAL DATA:  Abdominal distension EXAM: CT ABDOMEN AND PELVIS WITHOUT CONTRAST TECHNIQUE: Multidetector CT imaging of the abdomen and pelvis was performed following the standard protocol without IV contrast. COMPARISON:  CT 09/27/2020 FINDINGS: Lower chest: New small-to-moderate  right pleural effusion with right basilar atelectasis and consolidation. There is high-density material within the atelectatic right lower lobe suggesting aspirated oral contrast (series 2, image 1). New small left pleural effusion with associated mild compressive atelectasis. Heart size within  normal limits. Coronary artery calcification. Hepatobiliary: Unenhanced liver is within normal limits. Status post cholecystectomy. Pancreas: No obvious abnormality. Spleen: Unremarkable. Adrenals/Urinary Tract: Stable appearance of the bilateral adrenal glands. Severe left-sided hydronephrosis with marked dilation of the right renal pelvis with abrupt caliber transition at the left ureteropelvic junction. Prominent urothelial thickening. Multiple calcifications within the left renal collecting system are unchanged and may represent vascular calcifications or small stones. Distal left ureter is nondilated. Urinary bladder is moderately distended. Surgically absent right kidney. Stomach/Bowel: Mildly thickened appearance of the visualized distal esophagus. The stomach is markedly dilated. Oral contrast is present within the stomach and small bowel. No evidence of gastric outlet obstruction. The distal duodenum is extrinsically compressed by the markedly dilated right renal pelvis and adjacent superior mesenteric artery (series 2, image 34). There is a right femoral hernia containing a loop of small bowel. No abnormal upstream dilatation of small bowel to suggest obstruction. There appears to be a conglomeration of completely collapsed small bowel loops within the right lower quadrant (series 2, image 54). The oral contrast has not reached the level of the hernia sac at the time of scanning. Large volume of stool within the colon. No colonic inflammatory changes are seen. Vascular/Lymphatic: Tortuous, ectatic abdominal aorta with extensive atherosclerotic calcifications throughout the aortoiliac axis. Vascular structures are suboptimally evaluated in the absence of IV contrast. No lymphadenopathy identified. Reproductive: Status post hysterectomy. No adnexal masses. Other: Interval development of small volume ascites. No organized abdominopelvic fluid collection. No pneumoperitoneum. Musculoskeletal: Lumbar  dextrocurvature with advanced multilevel lumbar spondylosis. No new or acute osseous findings. IMPRESSION:  1. Markedly distended stomach. Findings are likely related to duodenal obstruction. The distal duodenum is markedly narrowed between the superior mesenteric artery and the severely dilated left renal pelvis.  2. Persistent severe left-sided hydronephrosis with marked dilation of the right renal pelvis with abrupt caliber transition at the left ureteropelvic junction. Prominent urothelial thickening. 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  3. Interval development of small volume ascites.  4. Right femoral hernia containing a loop of small bowel. No abnormal upstream dilatation of small bowel to suggest obstruction. The oral contrast has not reached the level of the hernia sac at the time of scanning. Consider short interval repeat scan to evaluate transit through this location.  5. New small-to-moderate right pleural effusion with right basilar atelectasis and consolidation. There is high-density material within the atelectatic right lower lobe suggesting aspirated oral contrast.  6. New small left pleural effusion with associated mild compressive atelectasis.  7. Mildly thickened appearance of the visualized distal esophagus, which may reflect esophagitis.  8. Large volume of stool within the colon.  9. Aortic atherosclerosis. (ICD10-I70.0). Electronically Signed   By: Davina Poke D.O.   On: 10/06/2020 11:41    EKG: Independently reviewed.  A.fib 87.    Assessment/Plan Abdominal pain Assessment & Plan D/D include bowel obstruction / PUD? Esophagitis. GI consulted for her duodenal narrowing and esophagitis. NG tube in place for the ? Obstruction.  CT abdomen and pelvis shows Markedly distended stomach. Findings are likely related to duodenal obstruction. The distal duodenum is markedly narrowed between the superior mesenteric artery and the severely dilated left renal  pelvis. IV ppi.  Atrial fibrillation [facility]) Assessment &  Plan Currently in a.fib and cont metoprolol and eliquis.  Monitor with telemetry.   Hypertension Assessment & Plan Blood pressure (!) 143/64, pulse 72, temperature 98 F (36.7 C), temperature source Oral, resp. rate 16, height 5\' 5"  (1.651 m), weight 51.3 kg, SpO2 94 %. BP control is fair.We will continue pt on metoprolol.and hold zestril for her AKI.   Hypothyroidism Assessment & Plan Thyroid studies wnl and we will continue levothyroxine and 150 mcg.    Hydronephrosis Assessment & Plan Urology paged and waiting for response Dr. Elenor Quinones.    Moderate protein-calorie malnutrition [facility]) Assessment & Plan Dietitian consult. ?TPN.  Tobacco abuse Assessment & Plan Nicotine patch.    DVT prophylaxis:  Eliquis  Code Status:  Full code   Family Communication:  None at bedside   Disposition Plan:  SNF   Consults called:  GI. Gen Surg. Urology.  Admission status: Inpatient.   Para Skeans MD [facility] [phone] How to contact the [facility] Attending or Consulting provider [place] or covering provider during after hours [place], for this patient?    1. Check the care team in [facility] and look for a) attending/consulting [facility] provider listed and b) the [facility] team listed 2. Log into [email or web address] and use [place]'s universal password to access. If you do not have the password, please contact the hospital operator. 3. Locate the [facility] provider you are looking for under [facility] and page to a number that you can be directly reached. 4. If you still have difficulty reaching the provider, please page the [facility] (Director on Call) for the Hospitalists listed on amion for assistance. [email or web address] Password [facility] 10/06/2020, 4:47 PM

## 2020-10-06 NOTE — ED Triage Notes (Signed)
Pt to ED via [facility] from [facility] with new abdo disttention noted by rounding doctor this morning on L side abdo. Pt had not had BM since 10d per EMS. Pt [facility] discharged from [facility] inpatient unit yesterday. Pt states has not been eating. Pt was given 4mg  IV zofran by EMS and has 18g IV R AC. Pt in NAD.

## 2020-10-06 NOTE — Assessment & Plan Note (Signed)
Blood pressure (!) 143/64, pulse 72, temperature 98 F (36.7 C), temperature source Oral, resp. rate 16, height 5\' 5"  (1.651 m), weight 51.3 kg, SpO2 94 %. BP control is fair.We will continue pt on metoprolol.and hold zestril for her AKI.

## 2020-10-06 NOTE — Assessment & Plan Note (Signed)
-  Nicotine patch 

## 2020-10-06 NOTE — Assessment & Plan Note (Addendum)
D/D include bowel obstruction / PUD? Esophagitis. GI consulted for her duodenal narrowing and esophagitis. NG tube in place for the ? Obstruction.  CT abdomen and pelvis shows Markedly distended stomach. Findings are likely related to duodenal obstruction. The distal duodenum is markedly narrowed between the superior mesenteric artery and the severely dilated left renal pelvis. IV ppi.

## 2020-10-06 NOTE — ED Notes (Signed)
Pt states that  "belly is gurgling and I am burping a lot". Requesting more anti nausea meds. Did repeat ekg to show provider.

## 2020-10-06 NOTE — ED Provider Notes (Signed)
[facility] Emergency Department Provider Note ____________________________________________   None    (approximate)  I have reviewed the triage vital signs and the nursing notes.   HISTORY  Chief Complaint Abdominal Pain    HPI Cathy Solis is a 79 y.o. female with PMH as noted below including atrial fibrillation on Eliquis, COPD, and upper GI bleed who presents with lower abdominal pain since early this morning, mainly in the left lower abdomen, and associated with some distention to that area.  She states that the pain is now somewhat better.  The patient reports nausea and decreased p.o. intake over the last week.  She had one episode of vomiting early this morning and states it was nonbloody, however there is some brownish colored dried vomitus on her shirt.  She was just hospitalized for a urinary tract infection and discharged yesterday.  She also states that she has not had a bowel movement in about 10 days.  Past Medical History:  Diagnosis Date  . Breast cancer [facility]) 2006   Left breast, s/p radiation  . 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 Hypertension   . Kidney problem    Undeveloped R kidney  . 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 Personal history of tobacco use, presenting hazards to health 03/18/2016  . 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 Toe infection    followed by Dr. Jens Som    Patient Active Problem List   Diagnosis Date Noted  . Abdominal pain 10/06/2020  . Hematemesis with nausea   . Upper GI bleed   . UTI (urinary tract infection) 09/27/2020  . Acute pyelonephritis 09/27/2020  . Unintentional weight loss 09/27/2020  . Debility 09/27/2020  . Atrial fibrillation ([place])   . Moderate protein-calorie malnutrition ([place]) 09/03/2020  . 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 Chronic kidney disease (CKD), stage III (moderate) ([place]) 08/20/2019  . RBBB 05/09/2019  . Pressure injury of skin  01/15/2019  . COPD (chronic obstructive pulmonary disease) ([place]) 01/14/2019  . Hypertension 05/04/2018  . Osteoporosis 11/02/2017  . Coronary atherosclerosis 05/23/2016  . Personal history of tobacco use, presenting hazards to health 03/18/2016  . Idiopathic scoliosis 11/28/2014  . Tobacco abuse 11/28/2014  . 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 Hydronephrosis 02/04/2014  . 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 Carotid stenosis 05/23/2013  . Pernicious anemia 06/26/2012  . Hyperlipidemia 12/29/2011  . 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 Hypothyroidism 08/04/2011    Past Surgical History:  Procedure Laterality Date  . ABDOMINAL HYSTERECTOMY    . BLADDER REPAIR    . BREAST LUMPECTOMY Left   . BREAST SURGERY     left  . CAROTID ENDARTERECTOMY  2008   left  . COLONOSCOPY  2008  . EYE SURGERY Right 2013   cataract  . KIDNEY SURGERY  1949    Prior to Admission medications   Medication Sig Start Date End Date Taking? Authorizing Provider  acetaminophen (TYLENOL) 325 MG tablet Take 2 tablets (650 mg total) by mouth every 6 (six) hours as needed for mild pain (or Fever >/= 101). 10/05/20   Jennye Boroughs, MD  albuterol (VENTOLIN HFA) 108 (90 Base) MCG/ACT inhaler Inhale 2 puffs into the lungs every 6 (six) hours as needed for wheezing or shortness of breath. 07/21/20   Johnson, Megan P, DO  apixaban (ELIQUIS) 2.5 MG TABS tablet Take 1 tablet (2.5 mg  total) by mouth 2 (two) times daily. 10/05/20   Jennye Boroughs, MD  feeding supplement (ENSURE ENLIVE / ENSURE PLUS) LIQD Take 237 mLs by mouth 2 (two) times daily between meals. 10/05/20 11/04/20  Jennye Boroughs, MD  fluticasone furoate-vilanterol (BREO ELLIPTA) 200-25 MCG/INH AEPB Inhale 1 puff into the lungs daily. 10/06/20   Jennye Boroughs, MD  ipratropium-albuterol (DUONEB) 0.5-2.5 (3) MG/3ML SOLN Take 3 mLs by nebulization 2 (two) times daily for 4 days. DuoNeb nebulizer  treatments twice a day for 4 days followed by as needed for severe shortness of breath Patient taking differently: Take 3 mLs by nebulization 2 (two) times daily.  01/27/20 09/27/20  Park Liter P, DO  levothyroxine (SYNTHROID) 150 MCG tablet Take 1 tablet (150 mcg total) by mouth daily. 09/06/20   Johnson, Megan P, DO  lisinopril (ZESTRIL) 10 MG tablet Take 1 tablet (10 mg total) by mouth daily. TAKE 1 TABLET BY MOUTH EVERY DAY 07/21/20   [place] Liter P, DO  metoprolol tartrate (LOPRESSOR) 50 MG tablet Take 1 tablet (50 mg total) by mouth 2 (two) times daily. 10/05/20   Jennye Boroughs, MD  nicotine (NICODERM CQ - DOSED IN MG/24 HOURS) 21 mg/24hr patch Place 1 patch (21 mg total) onto the skin daily. 10/06/20   Jennye Boroughs, MD    Allergies Statins and Zetia [ezetimibe]  Family History  Problem Relation Age of Onset  . Stroke Mother   . Heart disease Father   . Heart attack Maternal Grandfather     Social History Social History   Tobacco Use  . Smoking status: Current Some Day Smoker    Packs/day: 0.50    Years: 30.00    Pack years: 15.00    Types: Cigarettes    Last attempt to quit: 01/08/2019    Years since quitting: 1.7  . Smokeless tobacco: Never Used  Vaping Use  . Vaping Use: Never used  Substance Use Topics  . Alcohol use: No  . Drug use: No    Review of Systems  Constitutional: No fever/chills. Eyes: No redness. ENT: No sore throat. Cardiovascular: Denies chest pain. Respiratory: Denies shortness of breath. Gastrointestinal: Positive for nausea. Genitourinary: Negative for dysuria.  Musculoskeletal: Negative for back pain. Skin: Negative for rash. Neurological: Negative for headache.   ____________________________________________   PHYSICAL EXAM:  VITAL SIGNS: ED Triage Vitals [10/06/20 0820]  Enc Vitals Group     BP      Pulse      Resp      Temp      Temp src      SpO2 94 %     Weight      Height      Head Circumference      Peak Flow       Pain Score      Pain Loc      Pain Edu?      Excl. in [place]?     Constitutional: Alert and oriented.  Uncomfortable and weak appearing but in no acute distress. Eyes: Conjunctivae are normal.  No scleral icterus. Head: Atraumatic. Nose: No congestion/rhinnorhea. Mouth/Throat: Mucous membranes are somewhat dry.   Neck: Normal range of motion.  Cardiovascular: Normal rate, regular rhythm.  Good peripheral circulation. Respiratory: Normal respiratory effort.  No retractions.  Gastrointestinal: Soft with mild left lower quadrant tenderness.  Mild lower abdominal distention.  Guaiac negative brown stool on DRE.  No palpable impacted stool. Genitourinary: No flank tenderness. Musculoskeletal: No lower extremity edema.  Extremities warm  and well perfused.  Neurologic:  Normal speech and language. No gross focal neurologic deficits are appreciated.  Skin:  Skin is warm and dry. No rash noted. Psychiatric: Mood and affect are normal. Speech and behavior are normal.  ____________________________________________   LABS (all labs ordered are listed, but only abnormal results are displayed)  Labs Reviewed  CBC WITH DIFFERENTIAL/PLATELET - Abnormal; Notable for the following components:      Result Value   WBC 14.7 (*)    Platelets 415 (*)    Neutro Abs 12.1 (*)    Monocytes Absolute 1.1 (*)    Abs Immature Granulocytes 0.20 (*)    All other components within normal limits  COMPREHENSIVE METABOLIC PANEL - Abnormal; Notable for the following components:   Sodium 132 (*)    Chloride 97 (*)    Glucose, Bld 124 (*)    BUN 27 (*)    Creatinine, Ser 1.56 (*)    Calcium 8.5 (*)    Total Protein 6.3 (*)    Albumin 2.4 (*)    AST 13 (*)    GFR, Estimated 33 (*)    All other components within normal limits  RESP PANEL BY RT PCR (RSV, FLU A&B, COVID)  LACTIC ACID, PLASMA  LIPASE, BLOOD  PROTIME-INR  URINALYSIS, COMPLETE (UACMP) WITH MICROSCOPIC  TYPE AND SCREEN    ____________________________________________  EKG  ED ECG REPORT I, Arta Silence, the attending physician, personally viewed and interpreted this ECG.  Date: 10/06/2020 EKG Time: 0819 Rate: 102 Rhythm: atrial fibrillation QRS Axis: normal Intervals: incomplete RBBB ST/T Wave abnormalities: normal Narrative Interpretation: no evidence of acute ischemia ____________________________________________  RADIOLOGY  CT abdomen/pelvis: Duodenal obstruction with dilation of the stomach.  Mild esophagitis.  Severe hydronephrosis with dilation of the renal pelvis.  Right femoral hernia with no evidence of obstruction.  ____________________________________________   PROCEDURES  Procedure(s) performed: No  Procedures  Critical Care performed: No ____________________________________________   INITIAL IMPRESSION / ASSESSMENT AND PLAN / ED COURSE  Pertinent labs & imaging results that were available during my care of the patient were reviewed by me and considered in my medical decision making (see chart for details).  79 year old female with PMH as noted above presents with lower abdominal pain and distention as well as persistent nausea and an episode of vomiting.  She has also had constipation for about 10 days.  I reviewed the past medical records in [place].  The patient was just admitted for acute pyelonephritis and AKI and discharged yesterday.  She was evaluated cardiology for atrial fibrillation with RVR and started on Eliquis.  She was noted to have persistent nausea and poor oral intake during her hospitalization.  She has a noted history of upper GI bleed, however I do not see any recent upper endoscopy.  On exam, the patient is overall weak appearing but in no acute distress.  Her vital signs are normal.  The abdomen is soft with mild lower abdominal distention and mild tenderness especially on the left.  There is no fecal impaction and the stool is guaiac negative  although the patient has some dried vomitus on her shirt that appears somewhat reddish-brown in color.  Differential includes constipation, ileus, small bowel obstruction, volvulus, colitis, diverticulitis, gastritis.  We will obtain a lab work-up, CT abdomen, and reassess.  ----------------------------------------- 1:09 PM on 10/06/2020 -----------------------------------------  CT shows significant dilation of the stomach with partial duodenal obstruction.  The patient has chronic significant left-sided hydronephrosis, with a dilated renal pelvis abutting  the duodenum.  It appears that this could be contributing to the duodenal obstruction and gastric dilation.  I consulted Dr. Bernardo Heater from urology who advises that the patient can be evaluated for a possible stent although this is usually not indicated if the hydronephrosis and UPJ obstruction is chronic.  He recommends decompression of the stomach first, but will be available to consult on the inpatient service.  I also discussed the case with Dr. Christian Mate from general surgery.  He advises that there is no indication for acute surgical intervention, but agrees with the plan for NG tube.  I also consulted Dr. Marius Ditch from GI who will evaluate the patient.  I have ordered a Protonix drip due to the esophagitis/gastritis and the possibility of upper GI bleed.  We will place an NG tube for decompression of the stomach.  Lab work-up is overall reassuring and the patient's vital signs have remained stable.  I discussed the case with Dr. Posey Pronto from the hospitalist service for admission.  ____________________________________________   FINAL CLINICAL IMPRESSION(S) / ED DIAGNOSES  Final diagnoses:  Duodenal obstruction      NEW MEDICATIONS STARTED DURING THIS VISIT:  New Prescriptions   No medications on file     Note:  This document was prepared using Dragon voice recognition software and may include unintentional dictation errors.    Arta Silence, MD 10/06/20 1540

## 2020-10-06 NOTE — Assessment & Plan Note (Signed)
Currently in a.fib and cont metoprolol and eliquis.  Monitor with telemetry.

## 2020-10-07 DIAGNOSIS — Q6211 Congenital occlusion of ureteropelvic junction: Secondary | ICD-10-CM | POA: Diagnosis not present

## 2020-10-07 DIAGNOSIS — K315 Obstruction of duodenum: Secondary | ICD-10-CM | POA: Diagnosis not present

## 2020-10-07 DIAGNOSIS — R1084 Generalized abdominal pain: Secondary | ICD-10-CM | POA: Diagnosis not present

## 2020-10-07 DIAGNOSIS — R1012 Left upper quadrant pain: Secondary | ICD-10-CM

## 2020-10-07 DIAGNOSIS — I10 Essential (primary) hypertension: Secondary | ICD-10-CM | POA: Diagnosis not present

## 2020-10-07 DIAGNOSIS — E039 Hypothyroidism, unspecified: Secondary | ICD-10-CM | POA: Diagnosis not present

## 2020-10-07 LAB — COMPREHENSIVE METABOLIC PANEL
ALT: 9 U/L (ref 0–44)
AST: 12 U/L — ABNORMAL LOW (ref 15–41)
Albumin: 2.1 g/dL — ABNORMAL LOW (ref 3.5–5.0)
Alkaline Phosphatase: 40 U/L (ref 38–126)
Anion gap: 9 (ref 5–15)
BUN: 25 mg/dL — ABNORMAL HIGH (ref 8–23)
CO2: 24 mmol/L (ref 22–32)
Calcium: 8.4 mg/dL — ABNORMAL LOW (ref 8.9–10.3)
Chloride: 99 mmol/L (ref 98–111)
Creatinine, Ser: 1.47 mg/dL — ABNORMAL HIGH (ref 0.44–1.00)
GFR, Estimated: 36 mL/min — ABNORMAL LOW (ref >60)
Glucose, Bld: 85 mg/dL (ref 70–99)
Potassium: 4.3 mmol/L (ref 3.5–5.1)
Sodium: 132 mmol/L — ABNORMAL LOW (ref 135–145)
Total Bilirubin: 0.5 mg/dL (ref 0.3–1.2)
Total Protein: 5.6 g/dL — ABNORMAL LOW (ref 6.5–8.1)

## 2020-10-07 LAB — CBC
HCT: 34.3 % — ABNORMAL LOW (ref 36.0–46.0)
Hemoglobin: 11.3 g/dL — ABNORMAL LOW (ref 12.0–15.0)
MCH: 30.4 pg (ref 26.0–34.0)
MCHC: 32.9 g/dL (ref 30.0–36.0)
MCV: 92.2 fL (ref 80.0–100.0)
Platelets: 358 10*3/uL (ref 150–400)
RBC: 3.72 MIL/uL — ABNORMAL LOW (ref 3.87–5.11)
RDW: 13.2 % (ref 11.5–15.5)
WBC: 10.7 10*3/uL — ABNORMAL HIGH (ref 4.0–10.5)
nRBC: 0 % (ref 0.0–0.2)

## 2020-10-07 MED ORDER — POLYETHYLENE GLYCOL 3350 17 G PO PACK
17.0000 g | PACK | Freq: Every day | ORAL | Status: DC
  Administered 2020-10-07 – 2020-10-09 (×3): 17 g via NASOGASTRIC
  Filled 2020-10-07 (×3): qty 1

## 2020-10-07 MED ORDER — LEVOTHYROXINE SODIUM 100 MCG/5ML IV SOLN: 112.0000 ug | Freq: Every day | INTRAVENOUS | Status: DC

## 2020-10-07 MED ORDER — HYDRALAZINE HCL 20 MG/ML IJ SOLN: 10.0000 mg | Freq: Four times a day (QID) | INTRAMUSCULAR | Status: DC | PRN

## 2020-10-07 MED ORDER — METOPROLOL TARTRATE 50 MG PO TABS
50.0000 mg | ORAL_TABLET | Freq: Two times a day (BID) | ORAL | Status: DC
  Administered 2020-10-07 – 2020-10-21 (×28): 50 mg
  Filled 2020-10-07 (×31): qty 1

## 2020-10-07 MED ORDER — LEVOTHYROXINE SODIUM 50 MCG PO TABS
150.0000 ug | ORAL_TABLET | Freq: Every day | ORAL | Status: DC
  Administered 2020-10-08 – 2020-10-21 (×14): 150 ug
  Filled 2020-10-07 (×15): qty 1

## 2020-10-07 MED ORDER — SUCRALFATE 1 GM/10ML PO SUSP
1.0000 g | Freq: Three times a day (TID) | ORAL | Status: DC
  Administered 2020-10-07 – 2020-10-21 (×47): 1 g
  Filled 2020-10-07 (×45): qty 10

## 2020-10-07 MED ORDER — ENSURE ENLIVE PO LIQD
237.0000 mL | Freq: Three times a day (TID) | ORAL | Status: DC
  Administered 2020-10-08 – 2020-10-11 (×3): 237 mL via ORAL

## 2020-10-07 MED ORDER — SODIUM CHLORIDE 0.9 % IV SOLN
1.0000 g | INTRAVENOUS | Status: AC
  Administered 2020-10-07 – 2020-10-20 (×14): 1 g via INTRAVENOUS
  Filled 2020-10-07: qty 10
  Filled 2020-10-07 (×2): qty 1
  Filled 2020-10-07: qty 10
  Filled 2020-10-07 (×10): qty 1

## 2020-10-07 MED ORDER — ONDANSETRON HCL 4 MG/2ML IJ SOLN
4.0000 mg | Freq: Four times a day (QID) | INTRAMUSCULAR | Status: DC | PRN
  Administered 2020-10-09: 4 mg via INTRAVENOUS
  Filled 2020-10-07: qty 2

## 2020-10-07 MED ORDER — BISACODYL 5 MG PO TBEC
5.0000 mg | DELAYED_RELEASE_TABLET | Freq: Every day | ORAL | Status: DC | PRN
  Administered 2020-10-09 (×2): 5 mg via ORAL
  Filled 2020-10-07 (×2): qty 1

## 2020-10-07 MED ORDER — METOPROLOL TARTRATE 5 MG/5ML IV SOLN
5.0000 mg | Freq: Four times a day (QID) | INTRAVENOUS | Status: DC
  Administered 2020-10-07 (×2): 5 mg via INTRAVENOUS
  Filled 2020-10-07 (×2): qty 5

## 2020-10-07 MED ORDER — BISACODYL 10 MG RE SUPP: 10.0000 mg | Freq: Every day | RECTAL | Status: DC | PRN

## 2020-10-07 MED ORDER — ACETAMINOPHEN 325 MG PO TABS
650.0000 mg | ORAL_TABLET | Freq: Four times a day (QID) | ORAL | Status: DC | PRN
  Administered 2020-10-08: 18:00:00 650 mg
  Filled 2020-10-07: qty 2

## 2020-10-07 NOTE — Progress Notes (Signed)
Initial Nutrition Assessment  DOCUMENTATION CODES:   Severe malnutrition in context of chronic illness  INTERVENTION:  Increase to Ensure Enlive po TID, each supplement provides 350 kcal and 20 grams of protein. Patient prefers vanilla.  Provide Magic cup TID with meals, each supplement provides 290 kcal and 9 grams of protein. Patient prefers orange.  As patient has now been 1.5-2 weeks with poor PO intake per family report, consider initiation of tube feeds via NGT in place if PO intake does not improve and this aligns with goals of care. If tube feeds are initiated recommend:  -Initiate Osmolite 1.2 Cal at 20 mL/hr and advance by 15 mL/hr every 12 hours to goal rate of 50 mL/hr per tube -PROSource TF 45 mL once daily per tube -Provides 1480 kcal, 78 grams of protein, 984 mL H2O daily  If patient unable to tolerate oral or enteral nutrition in setting of duodenal compression may need to consider initiation of parenteral nutrition if this aligns with goals of care. Will continue to monitor for plan.  Monitor magnesium, potassium, and phosphorus daily for at least 3 days, MD to replete as needed, as pt is at risk for refeeding syndrome given severe malnutrition, prolonged poor PO intake per family report.  NUTRITION DIAGNOSIS:   Severe Malnutrition related to chronic illness (COPD, CKD) as evidenced by severe fat depletion, severe muscle depletion.  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   Met with patient and her sister at bedside. Patient is sleepy and unable to provide much history. Per sister patient typically has a good appetite and intake. She typically eats 2-3 meals per day. Breakfast is usually oatmeal or eggs. Lunch may be a sandwich. Dinner is usually meat with sides. She also snacks usually. For the past 1.5-2 weeks patient has not been able to eat well or tolerate much by mouth due to N/V. She had NGT placed to suction yesterday. Today the suction was turned off and patient was placed on full liquid diet to try. Sister reports she had some pudding, gelatin, bites of soup, and sips of tea. She reports patient may be able to tolerate some vanilla Ensure or orange Magic Cup.  Sister reports patient had been around 113 lbs and then lost down to around 100 lbs over the last few months. Per chart weights fluctuate between 49-53 kg. Suspect weight of 59 kg on 09/27/2020 is inaccurate. Patient currently documented to be 51.3 kg (113.1 lbs).  Medications reviewed and include: levothyroxine, nicotine patch, Protonix, Miralax, Carafate 1 gram TID and QHS.  Labs reviewed: Sodium 132, BUN 25, Creatinine 1.47.  Enteral Access: 16 Fr. NGT placed 11/16; terminates in stomach per abdominal x-ray 11/16  Discussed with MD via secure chat.  NUTRITION - FOCUSED PHYSICAL EXAM:    Most Recent Value  Orbital Region Severe depletion  Upper  Arm Region Severe depletion  Thoracic and Lumbar Region Severe depletion  Buccal Region Severe depletion  Temple Region Severe depletion  Clavicle Bone Region Severe depletion  Clavicle and Acromion Bone Region Severe depletion  Scapular Bone Region Severe depletion  Dorsal Hand Severe depletion  Patellar  Region Severe depletion  Anterior Thigh Region Severe depletion  Posterior Calf Region Severe depletion  Edema (RD Assessment) None  Hair Reviewed  Eyes Unable to assess  Mouth Unable to assess  Skin Reviewed  Nails Reviewed     Diet Order:   Diet Order            Diet full liquid Room service appropriate? Yes; Fluid consistency: Thin  Diet effective now                EDUCATION NEEDS:   No education needs have been identified at this time  Skin:  Skin Assessment: Skin Integrity Issues: Skin Integrity Issues:: Other (Comment) Other: blanchable red area on bottom per RN documentation  Last BM:  Unknown  Height:   Ht Readings from Last 1 Encounters:  10/06/20 '5\' 5"'  (1.651 m)   Weight:   Wt Readings from Last 1 Encounters:  10/06/20 51.3 kg   Ideal Body Weight:  56.8 kg  BMI:  Body mass index is 18.82 kg/m.  Estimated Nutritional Needs:   Kcal:  1400-1600  Protein:  75-85 grams  Fluid:  1.3-1.5 L/day  Jacklynn Barnacle, MS, RD, LDN Pager number available on Amion

## 2020-10-07 NOTE — NC FL2 (Signed)
[place] LEVEL OF CARE SCREENING TOOL     IDENTIFICATION  Patient Name: Cathy Solis Birthdate: [birth year] Sex: female Admission Date (Current Location): 10/06/2020  [place] and [place] Number:  Engineering geologist and Address:  [facility], [address] [place], [place] [address]      Provider Number: [id number]  Attending Physician Name and Address:  Ezekiel Slocumb, DO  Relative Name and Phone Number:  Raechel Ache [phone]    Current Level of Care: Hospital Recommended Level of Care: [place] Prior Approval Number:    Date Approved/Denied:   PASRR Number: [id number]  Discharge Plan: SNF    Current Diagnoses: Patient Active Problem List   Diagnosis Date Noted  . Abdominal pain 10/06/2020  . Hematemesis with nausea   . Upper GI bleed   . UTI (urinary tract infection) 09/27/2020  . Acute pyelonephritis 09/27/2020  . Unintentional weight loss 09/27/2020  . Debility 09/27/2020  . Atrial fibrillation ([place])   . Moderate protein-calorie malnutrition ([place]) 09/03/2020  . 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 Chronic kidney disease (CKD), stage III (moderate) ([place]) 08/20/2019  . RBBB 05/09/2019  . Pressure injury of skin 01/15/2019  . COPD (chronic obstructive pulmonary disease) ([place]) 01/14/2019  . Hypertension 05/04/2018  . Osteoporosis 11/02/2017  . Coronary atherosclerosis 05/23/2016  . Personal history of tobacco use, presenting hazards to health 03/18/2016  . Idiopathic scoliosis 11/28/2014  . Tobacco abuse 11/28/2014  . 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 Hydronephrosis 02/04/2014  . 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 Carotid stenosis 05/23/2013  . Pernicious anemia 06/26/2012  . Hyperlipidemia 12/29/2011  . 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 Hypothyroidism 08/04/2011    Orientation RESPIRATION BLADDER Height & Weight     Self,  Time, Situation, Place  O2 ([place] 2L) Incontinent, External catheter Weight: 51.3 kg Height:  5\' 5"  (165.1 cm)  BEHAVIORAL SYMPTOMS/MOOD NEUROLOGICAL BOWEL NUTRITION STATUS      Continent Diet (see discharge summary)  AMBULATORY STATUS COMMUNICATION OF NEEDS Skin   Extensive Assist Verbally PU Stage and Appropriate Care (stage 1 sacral- foam dressing)                       Personal Care Assistance Level of Assistance  Bathing, Feeding, Dressing Bathing Assistance: Limited assistance Feeding assistance: Limited assistance Dressing Assistance: Limited assistance     Functional Limitations Info             SPECIAL CARE FACTORS FREQUENCY  PT (By licensed PT), OT (By licensed OT)                    Contractures Contractures Info: Not present    Additional Factors Info  Code Status, Allergies Code Status Info: Full Allergies Info: Statins, zetia           Current Medications (10/07/2020):  This is the current hospital active medication list Current Facility-Administered Medications  Medication Dose Route Frequency Provider Last Rate Last Admin  . acetaminophen (TYLENOL) tablet 650 mg  650 mg Per Tube Q6H PRN Nicole Kindred A, DO      . albuterol (VENTOLIN HFA) 108 (90 Base) MCG/ACT inhaler 2 puff  2 puff Inhalation Q6H PRN Para Skeans, MD      . feeding supplement (ENSURE ENLIVE / ENSURE PLUS) liquid 237 mL  237 mL Oral BID BM Para Skeans, MD   237 mL at 10/07/20 0946  .  fluticasone furoate-vilanterol (BREO ELLIPTA) 200-25 MCG/INH 1 puff  1 puff Inhalation Daily Oswald Hillock, RPH      . iohexol (OMNIPAQUE) 9 MG/ML oral solution 500 mL  500 mL Oral BID PRN Arta Silence, MD   500 mL at 10/06/20 0923  . ipratropium-albuterol (DUONEB) 0.5-2.5 (3) MG/3ML nebulizer solution 3 mL  3 mL Nebulization BID Para Skeans, MD   3 mL at 10/07/20 0725  . lactated ringers infusion   Intravenous Continuous Para Skeans, MD 50 mL/hr at 10/07/20 0400 Rate Verify at  10/07/20 0400  . [START ON 10/10/2020] levothyroxine (SYNTHROID, LEVOTHROID) injection 112 mcg  112 mcg Intravenous Daily Florina Ou V, MD      . metoprolol tartrate (LOPRESSOR) injection 5 mg  5 mg Intravenous Q6H Para Skeans, MD   5 mg at 10/07/20 0801  . nicotine (NICODERM CQ - dosed in mg/24 hours) patch 21 mg  21 mg Transdermal Daily Para Skeans, MD   21 mg at 10/06/20 1742  . ondansetron (ZOFRAN) injection 4 mg  4 mg Intravenous Once Arta Silence, MD      . pantoprazole (PROTONIX) 80 mg in sodium chloride 0.9 % 100 mL (0.8 mg/mL) infusion  8 mg/hr Intravenous Continuous Arta Silence, MD 10 mL/hr at 10/07/20 0400 8 mg/hr at 10/07/20 0400  . pantoprazole (PROTONIX) injection 40 mg  40 mg Intravenous Q12H Para Skeans, MD   40 mg at 10/07/20 0947  . sucralfate (CARAFATE) 1 GM/10ML suspension 1 g  1 g Per Tube TID WC & HS Ezekiel Slocumb, DO         Discharge Medications: Please see discharge summary for a list of discharge medications.  Relevant Imaging Results:  Relevant Lab Results:   Additional Information SS# [id number]  Shelbie Hutching, RN

## 2020-10-07 NOTE — Progress Notes (Signed)
Cephas Darby, MD [address]  [place], [place] [address]  Main: [phone]  Fax: [phone] Pager: [phone]   Subjective: No acute events overnight.  Patient's younger sister is bedside.  Patient is wanting to drink something by mouth.  About 150 mL of bilious output noticed in the canister from low intermittent suction.  She denies any abdominal pain.  Waiting to be seen by urology  Objective: Vital signs in last 24 hours: Vitals:   10/06/20 2329 10/07/20 0511 10/07/20 0725 10/07/20 1100  BP: 137/76 139/77 (!) 144/70 (!) 157/77  Pulse: 75 81 91 89  Resp: 16 18 16 14   Temp: 98.1 F (36.7 C) 98.1 F (36.7 C) 98.5 F (36.9 C) 98.4 F (36.9 C)  TempSrc:   Oral   SpO2: 96% 98% 94% 97%  Weight:      Height:       Weight change:   Intake/Output Summary (Last 24 hours) at 10/07/2020 1206 Last data filed at 10/07/2020 0400 Gross per 24 hour  Intake 885.77 ml  Output 300 ml  Net 585.77 ml     Exam: Heart:: Regular rate and rhythm, S1S2 present or without murmur or extra heart sounds Lungs: normal and clear to auscultation Abdomen: soft, nontender, normal bowel sounds   Lab Results: @LABTEST2 @ Micro Results: Recent Results (from the past 240 hour(s))  Respiratory Panel by RT PCR (Flu A&B, Covid) - Nasopharyngeal Swab     Status: None   Collection Time: 09/27/20 12:24 PM   Specimen: Nasopharyngeal Swab  Result Value Ref Range Status   SARS Coronavirus 2 by RT PCR NEGATIVE NEGATIVE Final    Comment: (NOTE) SARS-CoV-2 target nucleic acids are NOT DETECTED.  The SARS-CoV-2 RNA is generally detectable in upper respiratoy specimens during the acute phase of infection. The lowest concentration of SARS-CoV-2 viral copies this assay can detect is 131 copies/mL. A negative result does not preclude SARS-Cov-2 infection and should not be used as the sole basis for treatment or other patient management decisions. A negative result may occur with   improper specimen collection/handling, submission of specimen other than nasopharyngeal swab, presence of viral mutation(s) within the areas targeted by this assay, and inadequate number of viral copies (<131 copies/mL). A negative result must be combined with clinical observations, patient history, and epidemiological information. The expected result is Negative.  Fact Sheet for Patients:  PinkCheek.be  Fact Sheet for Healthcare Providers:  GravelBags.it  This test is no t yet approved or cleared by the [place] FDA and  has been authorized for detection and/or diagnosis of SARS-CoV-2 by FDA under an Emergency Use Authorization (EUA). This EUA will remain  in effect (meaning this test can be used) for the duration of the COVID-19 declaration under Section 564(b)(1) of the Act, 21 U.S.C. section 360bbb-3(b)(1), unless the authorization is terminated or revoked sooner.     Influenza A by PCR NEGATIVE NEGATIVE Final   Influenza B by PCR NEGATIVE NEGATIVE Final    Comment: (NOTE) The Xpert Xpress SARS-CoV-2/FLU/RSV assay is intended as an aid in  the diagnosis of influenza from Nasopharyngeal swab specimens and  should not be used as a sole basis for treatment. Nasal washings and  aspirates are unacceptable for Xpert Xpress SARS-CoV-2/FLU/RSV  testing.  Fact Sheet for Patients: PinkCheek.be  Fact Sheet for Healthcare Providers: GravelBags.it  This test is not yet approved or cleared by the [place] FDA and  has been authorized for detection and/or diagnosis  of SARS-CoV-2 by  FDA under an Emergency Use Authorization (EUA). This EUA will remain  in effect (meaning this test can be used) for the duration of the  Covid-19 declaration under Section 564(b)(1) of the Act, 21  U.S.C. section 360bbb-3(b)(1), unless the authorization is  terminated or  revoked. Performed at [facility], [address] [place], [place] [address]   SARS Coronavirus 2 by RT PCR (hospital order, performed in [facility] hospital lab) Nasopharyngeal Nasopharyngeal Swab     Status: None   Collection Time: 10/05/20  1:02 PM   Specimen: Nasopharyngeal Swab  Result Value Ref Range Status   SARS Coronavirus 2 NEGATIVE NEGATIVE Final    Comment: (NOTE) SARS-CoV-2 target nucleic acids are NOT DETECTED.  The SARS-CoV-2 RNA is generally detectable in upper and lower respiratory specimens during the acute phase of infection. The lowest concentration of SARS-CoV-2 viral copies this assay can detect is 250 copies / mL. A negative result does not preclude SARS-CoV-2 infection and should not be used as the sole basis for treatment or other patient management decisions.  A negative result may occur with improper specimen collection / handling, submission of specimen other than nasopharyngeal swab, presence of viral mutation(s) within the areas targeted by this assay, and inadequate number of viral copies (<250 copies / mL). A negative result must be combined with clinical observations, patient history, and epidemiological information.  Fact Sheet for Patients:   StrictlyIdeas.no  Fact Sheet for Healthcare Providers: BankingDealers.co.za  This test is not yet approved or  cleared by the [place] FDA and has been authorized for detection and/or diagnosis of SARS-CoV-2 by FDA under an Emergency Use Authorization (EUA).  This EUA will remain in effect (meaning this test can be used) for the duration of the COVID-19 declaration under Section 564(b)(1) of the Act, 21 U.S.C. section 360bbb-3(b)(1), unless the authorization is terminated or revoked sooner.  Performed at [facility], [address], [place], [place] [address]   Resp Panel by RT PCR (RSV, Flu A&B, Covid) - Urine, Clean Catch      Status: None   Collection Time: 10/06/20  1:50 PM   Specimen: Urine, Clean Catch  Result Value Ref Range Status   SARS Coronavirus 2 by RT PCR NEGATIVE NEGATIVE Final    Comment: (NOTE) SARS-CoV-2 target nucleic acids are NOT DETECTED.  The SARS-CoV-2 RNA is generally detectable in upper respiratoy specimens during the acute phase of infection. The lowest concentration of SARS-CoV-2 viral copies this assay can detect is 131 copies/mL. A negative result does not preclude SARS-Cov-2 infection and should not be used as the sole basis for treatment or other patient management decisions. A negative result may occur with  improper specimen collection/handling, submission of specimen other than nasopharyngeal swab, presence of viral mutation(s) within the areas targeted by this assay, and inadequate number of viral copies (<131 copies/mL). A negative result must be combined with clinical observations, patient history, and epidemiological information. The expected result is Negative.  Fact Sheet for Patients:  PinkCheek.be  Fact Sheet for Healthcare Providers:  GravelBags.it  This test is no t yet approved or cleared by the [place] FDA and  has been authorized for detection and/or diagnosis of SARS-CoV-2 by FDA under an Emergency Use Authorization (EUA). This EUA will remain  in effect (meaning this test can be used) for the duration of the COVID-19 declaration under Section 564(b)(1) of the Act, 21 U.S.C. section 360bbb-3(b)(1), unless the authorization is terminated or  revoked sooner.     Influenza A by PCR NEGATIVE NEGATIVE Final   Influenza B by PCR NEGATIVE NEGATIVE Final    Comment: (NOTE) The Xpert Xpress SARS-CoV-2/FLU/RSV assay is intended as an aid in  the diagnosis of influenza from Nasopharyngeal swab specimens and  should not be used as a sole basis for treatment. Nasal washings and  aspirates are  unacceptable for Xpert Xpress SARS-CoV-2/FLU/RSV  testing.  Fact Sheet for Patients: PinkCheek.be  Fact Sheet for Healthcare Providers: GravelBags.it  This test is not yet approved or cleared by the [place] FDA and  has been authorized for detection and/or diagnosis of SARS-CoV-2 by  FDA under an Emergency Use Authorization (EUA). This EUA will remain  in effect (meaning this test can be used) for the duration of the  Covid-19 declaration under Section 564(b)(1) of the Act, 21  U.S.C. section 360bbb-3(b)(1), unless the authorization is  terminated or revoked.    Respiratory Syncytial Virus by PCR NEGATIVE NEGATIVE Final    Comment: (NOTE) Fact Sheet for Patients: PinkCheek.be  Fact Sheet for Healthcare Providers: GravelBags.it  This test is not yet approved or cleared by the [place] FDA and  has been authorized for detection and/or diagnosis of SARS-CoV-2 by  FDA under an Emergency Use Authorization (EUA). This EUA will remain  in effect (meaning this test can be used) for the duration of the  COVID-19 declaration under Section 564(b)(1) of the Act, 21 U.S.C.  section 360bbb-3(b)(1), unless the authorization is terminated or  revoked. Performed at [facility], [address] [place], [place] [address]    Studies/Results: CT ABDOMEN PELVIS WO CONTRAST  Result Date: 10/06/2020 CLINICAL DATA:  Abdominal distension EXAM: CT ABDOMEN AND PELVIS WITHOUT CONTRAST TECHNIQUE: Multidetector CT imaging of the abdomen and pelvis was performed following the standard protocol without IV contrast. COMPARISON:  CT 09/27/2020 FINDINGS: Lower chest: New small-to-moderate right pleural effusion with right basilar atelectasis and consolidation. There is high-density material within the atelectatic right lower lobe suggesting aspirated oral contrast (series 2,  image 1). New small left pleural effusion with associated mild compressive atelectasis. Heart size within normal limits. Coronary artery calcification. Hepatobiliary: Unenhanced liver is within normal limits. Status post cholecystectomy. Pancreas: No obvious abnormality. Spleen: Unremarkable. Adrenals/Urinary Tract: Stable appearance of the bilateral adrenal glands. Severe left-sided hydronephrosis with marked dilation of the right renal pelvis with abrupt caliber transition at the left ureteropelvic junction. Prominent urothelial thickening. Multiple calcifications within the left renal collecting system are unchanged and may represent vascular calcifications or small stones. Distal left ureter is nondilated. Urinary bladder is moderately distended. Surgically absent right kidney. Stomach/Bowel: Mildly thickened appearance of the visualized distal esophagus. The stomach is markedly dilated. Oral contrast is present within the stomach and small bowel. No evidence of gastric outlet obstruction. The distal duodenum is extrinsically compressed by the markedly dilated right renal pelvis and adjacent superior mesenteric artery (series 2, image 34). There is a right femoral hernia containing a loop of small bowel. No abnormal upstream dilatation of small bowel to suggest obstruction. There appears to be a conglomeration of completely collapsed small bowel loops within the right lower quadrant (series 2, image 54). The oral contrast has not reached the level of the hernia sac at the time of scanning. Large volume of stool within the colon. No colonic inflammatory changes are seen. Vascular/Lymphatic: Tortuous, ectatic abdominal aorta with extensive atherosclerotic calcifications throughout the aortoiliac axis. Vascular structures are suboptimally evaluated in the absence of IV  contrast. No lymphadenopathy identified. Reproductive: Status post hysterectomy. No adnexal masses. Other: Interval development of small volume  ascites. No organized abdominopelvic fluid collection. No pneumoperitoneum. Musculoskeletal: Lumbar dextrocurvature with advanced multilevel lumbar spondylosis. No new or acute osseous findings. IMPRESSION: 1. Markedly distended stomach. Findings are likely related to duodenal obstruction. The distal duodenum is markedly narrowed between the superior mesenteric artery and the severely dilated left renal pelvis. 2. Persistent severe left-sided hydronephrosis with marked dilation of the right renal pelvis with abrupt caliber transition at the left ureteropelvic junction. Prominent urothelial thickening. 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 3. Interval development of small volume ascites. 4. Right femoral hernia containing a loop of small bowel. No abnormal upstream dilatation of small bowel to suggest obstruction. The oral contrast has not reached the level of the hernia sac at the time of scanning. Consider short interval repeat scan to evaluate transit through this location. 5. New small-to-moderate right pleural effusion with right basilar atelectasis and consolidation. There is high-density material within the atelectatic right lower lobe suggesting aspirated oral contrast. 6. New small left pleural effusion with associated mild compressive atelectasis. 7. Mildly thickened appearance of the visualized distal esophagus, which may reflect esophagitis. 8. Large volume of stool within the colon. 9. Aortic atherosclerosis. (ICD10-I70.0). Electronically Signed   By: Davina Poke D.O.   On: 10/06/2020 11:41   DG Abd Portable 1 View  Result Date: 10/06/2020 CLINICAL DATA:  NG placement EXAM: PORTABLE ABDOMEN - 1 VIEW COMPARISON:  None. FINDINGS: Enteric tube passes into the stomach. Bowel gas pattern is not well evaluated. Dextroscoliosis of upper lumbar spine. IMPRESSION: Enteric tube within the stomach. Electronically Signed   By: Macy Mis M.D.   On: 10/06/2020 14:59   Medications:  I have  reviewed the patient's current medications. Prior to Admission:  Medications Prior to Admission  Medication Sig Dispense Refill Last Dose  . apixaban (ELIQUIS) 2.5 MG TABS tablet Take 1 tablet (2.5 mg total) by mouth 2 (two) times daily.   10/05/2020 at 2000  . levothyroxine (SYNTHROID) 150 MCG tablet Take 1 tablet (150 mcg total) by mouth daily. 90 tablet 3 10/06/2020 at 0630  . lisinopril (ZESTRIL) 10 MG tablet Take 1 tablet (10 mg total) by mouth daily. TAKE 1 TABLET BY MOUTH EVERY DAY 90 tablet 1 10/05/2020 at 0800  . metoprolol tartrate (LOPRESSOR) 50 MG tablet Take 1 tablet (50 mg total) by mouth 2 (two) times daily.   10/05/2020 at 2000  . nicotine (NICODERM CQ - DOSED IN MG/24 HOURS) 21 mg/24hr patch Place 1 patch (21 mg total) onto the skin daily. 28 patch  10/05/2020 at Unknown time  . acetaminophen (TYLENOL) 325 MG tablet Take 2 tablets (650 mg total) by mouth every 6 (six) hours as needed for mild pain (or Fever >/= 101).   unknown at prn  . albuterol (VENTOLIN HFA) 108 (90 Base) MCG/ACT inhaler Inhale 2 puffs into the lungs every 6 (six) hours as needed for wheezing or shortness of breath. 18 g 6 unknown at prn  . feeding supplement (ENSURE ENLIVE / ENSURE PLUS) LIQD Take 237 mLs by mouth 2 (two) times daily between meals.     . fluticasone furoate-vilanterol (BREO ELLIPTA) 200-25 MCG/INH AEPB Inhale 1 puff into the lungs daily.     [name] ipratropium-albuterol (DUONEB) 0.5-2.5 (3) MG/3ML SOLN Take 3 mLs by nebulization 2 (two) times daily for 4 days. DuoNeb nebulizer treatments twice a day for 4 days followed by as needed for severe  shortness of breath (Patient taking differently: Take 3 mLs by nebulization 2 (two) times daily. ) 360 mL 1    Scheduled: . feeding supplement  237 mL Oral BID BM  . fluticasone furoate-vilanterol  1 puff Inhalation Daily  . ipratropium-albuterol  3 mL Nebulization BID  . [START ON 10/10/2020] levothyroxine  112 mcg Intravenous Daily  . metoprolol tartrate  5  mg Intravenous Q6H  . nicotine  21 mg Transdermal Daily  . ondansetron (ZOFRAN) IV  4 mg Intravenous Once  . pantoprazole (PROTONIX) IV  40 mg Intravenous Q12H  . polyethylene glycol  17 g Per NG tube Daily  . sucralfate  1 g Per Tube TID WC & HS   Continuous: . lactated ringers 50 mL/hr at 10/07/20 0400   UVO:[id number], albuterol, iohexol Anti-infectives (From admission, onward)   None     Scheduled Meds: . feeding supplement  237 mL Oral BID BM  . fluticasone furoate-vilanterol  1 puff Inhalation Daily  . ipratropium-albuterol  3 mL Nebulization BID  . [START ON 10/10/2020] levothyroxine  112 mcg Intravenous Daily  . metoprolol tartrate  5 mg Intravenous Q6H  . nicotine  21 mg Transdermal Daily  . ondansetron (ZOFRAN) IV  4 mg Intravenous Once  . pantoprazole (PROTONIX) IV  40 mg Intravenous Q12H  . polyethylene glycol  17 g Per NG tube Daily  . sucralfate  1 g Per Tube TID WC & HS   Continuous Infusions: . lactated ringers 50 mL/hr at 10/07/20 0400   PRN Meds:.acetaminophen, albuterol, iohexol   Assessment: Principal Problem:   Abdominal pain Active Problems:   Hypothyroidism   Hydronephrosis   Tobacco abuse   Hypertension   Moderate protein-calorie malnutrition ([place])   Atrial fibrillation ([place])  GINNIFER CREELMAN is a 79 y.o. 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 coli UTI presented with abdominal distention secondary to extrinsic duodenal compression from markedly dilated right renal pelvis  Plan: Duodenal compression: Extrinsic secondary to markedly dilated right renal pelvis and left chronic UPJ stenosis.  Okay to try liquids today Discontinue  NG tube to suction Continue Protonix 40 mg IV twice daily No further episodes of hematemesis or coffee-ground emesis, hemoglobin is stable Do not recommend endoscopic evaluation at this time Urology has been consulted  Chronic constipation Recommend MiraLAX 1-2 times daily via NG tube   LOS: 1 day   Charolette Bultman 10/07/2020, 12:06 PM

## 2020-10-07 NOTE — H&P (View-Only) (Signed)
Urology Consult  I have been asked to see the patient by Dr. Arbutus Ped, for evaluation and management of left hydronephrosis.  Chief Complaint: Abdominal distention and pain, nausea, poor p.o. intake, constipation  History of Present Illness: Cathy Solis is a 79 y.o. 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  This time, she reported left-sided abdominal pain and distention, nausea, poor p.o. intake, last BM 10 days ago.  Admission CT with gastric distention and duodenal obstruction secondary to extrinsic compression by the right renal pelvis and adjacent SMA.  NG tube now in place.    Left hydronephrosis stable on admission CT compared to prior.  Admission UA notable for >50 WBCs/hpf, >50 RBCs/hpf, many bacteria, and WBC clumps. No urine culture sent, not on antibiotics. WBC count 10.7 today, creatinine relatively stable at 1.47.  She was previously followed by Dr. Thurmond Butts at [facility] for her solitary left kidney with chronic UPJ obstruction, and was last seen in 2017 when creatinine was 1.0, and she had moderate hydronephrosis on the left side on a renal ultrasound.  On reviewing her records, though her baseline creatinine was around 1 when she was previously followed by [facility], it appears to have risen to around ~1.5 over the last year.  Her urologist at [facility] had reportedly considered ureteroscopy on the left side for further evaluation, however preop testing showed normalization of her renal function, and they opted for observation.  She denies a history of recurrent UTIs.  There are no aggravating or alleviating factors.  Severity is moderate.  She is accompanied today at the bedside by her sister.  She reports back pain at baseline secondary to her scoliosis,  no acute worsening or changes over the past several days.  HC POA remains her son, [name], reachable by telephone.   Past Medical History:  Diagnosis Date  . Breast cancer [facility]) 2006   Left breast, s/p radiation  . 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 Hypertension   . Kidney problem    Undeveloped R kidney  . 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 Personal history of tobacco use, presenting hazards to health 03/18/2016  . Stroke [facility])    residual left sided weakness  . Toe infection    followed by Dr. Jens Som    Past Surgical History:  Procedure Laterality Date  . ABDOMINAL HYSTERECTOMY    . BLADDER REPAIR    . BREAST LUMPECTOMY Left   . BREAST SURGERY     left  . CAROTID ENDARTERECTOMY  2008   left  . COLONOSCOPY  2008  . EYE SURGERY Right 2013   cataract  . KIDNEY SURGERY  1949    Home Medications:  Current Meds  Medication Sig  . apixaban (ELIQUIS) 2.5 MG TABS tablet Take 1 tablet (2.5 mg total) by mouth 2 (two) times daily.  [name] levothyroxine (SYNTHROID) 150 MCG tablet Take 1 tablet (150 mcg total) by mouth daily.  [name] lisinopril (ZESTRIL) 10 MG tablet Take 1 tablet (10 mg total) by mouth daily. TAKE 1 TABLET BY MOUTH EVERY DAY  . metoprolol tartrate (LOPRESSOR) 50 MG tablet Take 1 tablet (50 mg total) by mouth 2 (two) times daily.  . nicotine (NICODERM CQ - DOSED IN MG/24 HOURS) 21  mg/24hr patch Place 1 patch (21 mg total) onto the skin daily.    Allergies:  Allergies  Allergen Reactions  . Statins Other (See Comments)    Myalgias  . Zetia [Ezetimibe] Other (See Comments)    myalgias    Family History  Problem Relation Age of Onset  . Stroke Mother   . Heart disease Father   . Heart attack Maternal Grandfather     Social History:  reports that she has been smoking cigarettes. She has a 15.00 pack-year smoking history. She has never used smokeless tobacco. She reports that she does not drink alcohol and does not use  drugs.  ROS: A complete review of systems was performed.  All systems are negative except for pertinent findings as noted.  Physical Exam:  Vital signs in last 24 hours: Temp:  [98.1 F (36.7 C)-98.5 F (36.9 C)] 98.5 F (36.9 C) (11/17 0725) Pulse Rate:  [72-99] 91 (11/17 0725) Resp:  [16-26] 16 (11/17 0725) BP: (104-159)/(54-105) 144/70 (11/17 0725) SpO2:  [91 %-100 %] 94 % (11/17 0725) FiO2 (%):  [28 %] 28 % (11/16 1336) Constitutional:  Alert and oriented, no acute distress, thin and fatigued appearing HEENT: [place] AT, moist mucus membranes Cardiovascular: No clubbing, cyanosis, or edema. Respiratory: Normal respiratory effort GU: No CVA tenderness Skin: No rashes, bruises or suspicious lesions Psychiatric: Normal mood and affect  Laboratory Data:  Recent Labs    10/06/20 0828 10/07/20 0342  WBC 14.7* 10.7*  HGB 12.4 11.3*  HCT 37.2 34.3*   Recent Labs    10/06/20 0828 10/07/20 0342  NA 132* 132*  K 3.9 4.3  CL 97* 99  CO2 25 24  GLUCOSE 124* 85  BUN 27* 25*  CREATININE 1.56* 1.47*  CALCIUM 8.5* 8.4*   Recent Labs    10/06/20 0838  INR 1.1   Urinalysis    Component Value Date/Time   COLORURINE YELLOW (A) 10/06/2020 1350   APPEARANCEUR TURBID (A) 10/06/2020 1350   APPEARANCEUR Cloudy (A) 01/04/2019 0928   LABSPEC 1.016 10/06/2020 1350   LABSPEC 1.010 01/03/2014 1104   PHURINE 5.0 10/06/2020 1350   GLUCOSEU NEGATIVE 10/06/2020 1350   GLUCOSEU Negative 01/03/2014 1104   HGBUR LARGE (A) 10/06/2020 1350   BILIRUBINUR NEGATIVE 10/06/2020 1350   BILIRUBINUR Negative 01/04/2019 0928   BILIRUBINUR Negative 01/03/2014 1104   KETONESUR NEGATIVE 10/06/2020 1350   PROTEINUR 100 (A) 10/06/2020 1350   UROBILINOGEN 0.2 02/14/2013 1345   NITRITE NEGATIVE 10/06/2020 1350   LEUKOCYTESUR MODERATE (A) 10/06/2020 1350   LEUKOCYTESUR Negative 01/03/2014 1104   Results for orders placed or performed during the hospital encounter of 10/06/20  Resp Panel by RT PCR  (RSV, Flu A&B, Covid) - Urine, Clean Catch     Status: None   Collection Time: 10/06/20  1:50 PM   Specimen: Urine, Clean Catch  Result Value Ref Range Status   SARS Coronavirus 2 by RT PCR NEGATIVE NEGATIVE Final    Comment: (NOTE) SARS-CoV-2 target nucleic acids are NOT DETECTED.  The SARS-CoV-2 RNA is generally detectable in upper respiratoy specimens during the acute phase of infection. The lowest concentration of SARS-CoV-2 viral copies this assay can detect is 131 copies/mL. A negative result does not preclude SARS-Cov-2 infection and should not be used as the sole basis for treatment or other patient management decisions. A negative result may occur with  improper specimen collection/handling, submission of specimen other than nasopharyngeal swab, presence of viral mutation(s) within the areas targeted by this assay,  and inadequate number of viral copies (<131 copies/mL). A negative result must be combined with clinical observations, patient history, and epidemiological information. The expected result is Negative.  Fact Sheet for Patients:  PinkCheek.be  Fact Sheet for Healthcare Providers:  GravelBags.it  This test is no t yet approved or cleared by the [place] FDA and  has been authorized for detection and/or diagnosis of SARS-CoV-2 by FDA under an Emergency Use Authorization (EUA). This EUA will remain  in effect (meaning this test can be used) for the duration of the COVID-19 declaration under Section 564(b)(1) of the Act, 21 U.S.C. section 360bbb-3(b)(1), unless the authorization is terminated or revoked sooner.     Influenza A by PCR NEGATIVE NEGATIVE Final   Influenza B by PCR NEGATIVE NEGATIVE Final    Comment: (NOTE) The Xpert Xpress SARS-CoV-2/FLU/RSV assay is intended as an aid in  the diagnosis of influenza from Nasopharyngeal swab specimens and  should not be used as a sole basis for  treatment. Nasal washings and  aspirates are unacceptable for Xpert Xpress SARS-CoV-2/FLU/RSV  testing.  Fact Sheet for Patients: PinkCheek.be  Fact Sheet for Healthcare Providers: GravelBags.it  This test is not yet approved or cleared by the [place] FDA and  has been authorized for detection and/or diagnosis of SARS-CoV-2 by  FDA under an Emergency Use Authorization (EUA). This EUA will remain  in effect (meaning this test can be used) for the duration of the  Covid-19 declaration under Section 564(b)(1) of the Act, 21  U.S.C. section 360bbb-3(b)(1), unless the authorization is  terminated or revoked.    Respiratory Syncytial Virus by PCR NEGATIVE NEGATIVE Final    Comment: (NOTE) Fact Sheet for Patients: PinkCheek.be  Fact Sheet for Healthcare Providers: GravelBags.it  This test is not yet approved or cleared by the [place] FDA and  has been authorized for detection and/or diagnosis of SARS-CoV-2 by  FDA under an Emergency Use Authorization (EUA). This EUA will remain  in effect (meaning this test can be used) for the duration of the  COVID-19 declaration under Section 564(b)(1) of the Act, 21 U.S.C.  section 360bbb-3(b)(1), unless the authorization is terminated or  revoked. Performed at [facility], [address] [place], [place] [address]     Radiologic Imaging: CT ABDOMEN PELVIS WO CONTRAST  Result Date: 10/06/2020 CLINICAL DATA:  Abdominal distension EXAM: CT ABDOMEN AND PELVIS WITHOUT CONTRAST TECHNIQUE: Multidetector CT imaging of the abdomen and pelvis was performed following the standard protocol without IV contrast. COMPARISON:  CT 09/27/2020 FINDINGS: Lower chest: New small-to-moderate right pleural effusion with right basilar atelectasis and consolidation. There is high-density material within the atelectatic right lower  lobe suggesting aspirated oral contrast (series 2, image 1). New small left pleural effusion with associated mild compressive atelectasis. Heart size within normal limits. Coronary artery calcification. Hepatobiliary: Unenhanced liver is within normal limits. Status post cholecystectomy. Pancreas: No obvious abnormality. Spleen: Unremarkable. Adrenals/Urinary Tract: Stable appearance of the bilateral adrenal glands. Severe left-sided hydronephrosis with marked dilation of the right renal pelvis with abrupt caliber transition at the left ureteropelvic junction. Prominent urothelial thickening. Multiple calcifications within the left renal collecting system are unchanged and may represent vascular calcifications or small stones. Distal left ureter is nondilated. Urinary bladder is moderately distended. Surgically absent right kidney. Stomach/Bowel: Mildly thickened appearance of the visualized distal esophagus. The stomach is markedly dilated. Oral contrast is present within the stomach and small bowel. No evidence of gastric outlet obstruction. The distal duodenum is  extrinsically compressed by the markedly dilated right renal pelvis and adjacent superior mesenteric artery (series 2, image 34). There is a right femoral hernia containing a loop of small bowel. No abnormal upstream dilatation of small bowel to suggest obstruction. There appears to be a conglomeration of completely collapsed small bowel loops within the right lower quadrant (series 2, image 54). The oral contrast has not reached the level of the hernia sac at the time of scanning. Large volume of stool within the colon. No colonic inflammatory changes are seen. Vascular/Lymphatic: Tortuous, ectatic abdominal aorta with extensive atherosclerotic calcifications throughout the aortoiliac axis. Vascular structures are suboptimally evaluated in the absence of IV contrast. No lymphadenopathy identified. Reproductive: Status post hysterectomy. No adnexal  masses. Other: Interval development of small volume ascites. No organized abdominopelvic fluid collection. No pneumoperitoneum. Musculoskeletal: Lumbar dextrocurvature with advanced multilevel lumbar spondylosis. No new or acute osseous findings. IMPRESSION: 1. Markedly distended stomach. Findings are likely related to duodenal obstruction. The distal duodenum is markedly narrowed between the superior mesenteric artery and the severely dilated left renal pelvis. 2. Persistent severe left-sided hydronephrosis with marked dilation of the right renal pelvis with abrupt caliber transition at the left ureteropelvic junction. Prominent urothelial thickening. 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 3. Interval development of small volume ascites. 4. Right femoral hernia containing a loop of small bowel. No abnormal upstream dilatation of small bowel to suggest obstruction. The oral contrast has not reached the level of the hernia sac at the time of scanning. Consider short interval repeat scan to evaluate transit through this location. 5. New small-to-moderate right pleural effusion with right basilar atelectasis and consolidation. There is high-density material within the atelectatic right lower lobe suggesting aspirated oral contrast. 6. New small left pleural effusion with associated mild compressive atelectasis. 7. Mildly thickened appearance of the visualized distal esophagus, which may reflect esophagitis. 8. Large volume of stool within the colon. 9. Aortic atherosclerosis. (ICD10-I70.0). Electronically Signed   By: Davina Poke D.O.   On: 10/06/2020 11:41   Assessment & Plan:  79 year old female with a solitary left kidney and chronic UPJ obstruction with hydronephrosis recently admitted for pyelonephritis subsequently readmitted with gastric distention and duodenal obstruction secondary to extrinsic compression believed secondary to the adjacent dilated left renal pelvis and SMA.  Left  hydronephrosis appears stable compared to prior and patient denies acute worsening in left flank pain.  Her UA does remain grossly infected.  I have ordered an add-on urine culture and recommend starting empiric antibiotics for treatment of persistent versus recurrent pyelonephritis.  Ultimately, patient may benefit from left ureteral stent placement, however it is unclear if this will resolve the duodenal obstruction given the stability of her hydronephrosis.  Dr. Diamantina Providence to discuss further with the patient's son to determine plan this afternoon.  No plans for urgent urologic intervention today.  We will continue to follow.  Recommendations: -Start empiric antibiotics for grossly positive UA and follow urine culture -Plan TBD per conversation with patient and son, possible left ureteral stent placement tomorrow  Thank you for involving me in this patient's care, I will continue to follow along.  Debroah Loop, PA-C 10/07/2020 11:29 AM

## 2020-10-07 NOTE — Consult Note (Signed)
Urology Consult  I have been asked to see the patient by Dr. Arbutus Ped, for evaluation and management of left hydronephrosis.  Chief Complaint: Abdominal distention and pain, nausea, poor p.o. intake, constipation  History of Present Illness: Cathy Solis is a 79 y.o. 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  This time, she reported left-sided abdominal pain and distention, nausea, poor p.o. intake, last BM 10 days ago.  Admission CT with gastric distention and duodenal obstruction secondary to extrinsic compression by the right renal pelvis and adjacent SMA.  NG tube now in place.    Left hydronephrosis stable on admission CT compared to prior.  Admission UA notable for >50 WBCs/hpf, >50 RBCs/hpf, many bacteria, and WBC clumps. No urine culture sent, not on antibiotics. WBC count 10.7 today, creatinine relatively stable at 1.47.  She was previously followed by Dr. Thurmond Butts at [facility] for her solitary left kidney with chronic UPJ obstruction, and was last seen in 2017 when creatinine was 1.0, and she had moderate hydronephrosis on the left side on a renal ultrasound.  On reviewing her records, though her baseline creatinine was around 1 when she was previously followed by [facility], it appears to have risen to around ~1.5 over the last year.  Her urologist at [facility] had reportedly considered ureteroscopy on the left side for further evaluation, however preop testing showed normalization of her renal function, and they opted for observation.  She denies a history of recurrent UTIs.  There are no aggravating or alleviating factors.  Severity is moderate.  She is accompanied today at the bedside by her sister.  She reports back pain at baseline secondary to her scoliosis,  no acute worsening or changes over the past several days.  HC POA remains her son, [name], reachable by telephone.   Past Medical History:  Diagnosis Date  . Breast cancer [facility]) 2006   Left breast, s/p radiation  . 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 Hypertension   . Kidney problem    Undeveloped R kidney  . 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 Personal history of tobacco use, presenting hazards to health 03/18/2016  . Stroke [facility])    residual left sided weakness  . Toe infection    followed by Dr. Jens Som    Past Surgical History:  Procedure Laterality Date  . ABDOMINAL HYSTERECTOMY    . BLADDER REPAIR    . BREAST LUMPECTOMY Left   . BREAST SURGERY     left  . CAROTID ENDARTERECTOMY  2008   left  . COLONOSCOPY  2008  . EYE SURGERY Right 2013   cataract  . KIDNEY SURGERY  1949    Home Medications:  Current Meds  Medication Sig  . apixaban (ELIQUIS) 2.5 MG TABS tablet Take 1 tablet (2.5 mg total) by mouth 2 (two) times daily.  [name] levothyroxine (SYNTHROID) 150 MCG tablet Take 1 tablet (150 mcg total) by mouth daily.  [name] lisinopril (ZESTRIL) 10 MG tablet Take 1 tablet (10 mg total) by mouth daily. TAKE 1 TABLET BY MOUTH EVERY DAY  . metoprolol tartrate (LOPRESSOR) 50 MG tablet Take 1 tablet (50 mg total) by mouth 2 (two) times daily.  . nicotine (NICODERM CQ - DOSED IN MG/24 HOURS) 21  mg/24hr patch Place 1 patch (21 mg total) onto the skin daily.    Allergies:  Allergies  Allergen Reactions  . Statins Other (See Comments)    Myalgias  . Zetia [Ezetimibe] Other (See Comments)    myalgias    Family History  Problem Relation Age of Onset  . Stroke Mother   . Heart disease Father   . Heart attack Maternal Grandfather     Social History:  reports that she has been smoking cigarettes. She has a 15.00 pack-year smoking history. She has never used smokeless tobacco. She reports that she does not drink alcohol and does not use  drugs.  ROS: A complete review of systems was performed.  All systems are negative except for pertinent findings as noted.  Physical Exam:  Vital signs in last 24 hours: Temp:  [98.1 F (36.7 C)-98.5 F (36.9 C)] 98.5 F (36.9 C) (11/17 0725) Pulse Rate:  [72-99] 91 (11/17 0725) Resp:  [16-26] 16 (11/17 0725) BP: (104-159)/(54-105) 144/70 (11/17 0725) SpO2:  [91 %-100 %] 94 % (11/17 0725) FiO2 (%):  [28 %] 28 % (11/16 1336) Constitutional:  Alert and oriented, no acute distress, thin and fatigued appearing HEENT: [place] AT, moist mucus membranes Cardiovascular: No clubbing, cyanosis, or edema. Respiratory: Normal respiratory effort GU: No CVA tenderness Skin: No rashes, bruises or suspicious lesions Psychiatric: Normal mood and affect  Laboratory Data:  Recent Labs    10/06/20 0828 10/07/20 0342  WBC 14.7* 10.7*  HGB 12.4 11.3*  HCT 37.2 34.3*   Recent Labs    10/06/20 0828 10/07/20 0342  NA 132* 132*  K 3.9 4.3  CL 97* 99  CO2 25 24  GLUCOSE 124* 85  BUN 27* 25*  CREATININE 1.56* 1.47*  CALCIUM 8.5* 8.4*   Recent Labs    10/06/20 0838  INR 1.1   Urinalysis    Component Value Date/Time   COLORURINE YELLOW (A) 10/06/2020 1350   APPEARANCEUR TURBID (A) 10/06/2020 1350   APPEARANCEUR Cloudy (A) 01/04/2019 0928   LABSPEC 1.016 10/06/2020 1350   LABSPEC 1.010 01/03/2014 1104   PHURINE 5.0 10/06/2020 1350   GLUCOSEU NEGATIVE 10/06/2020 1350   GLUCOSEU Negative 01/03/2014 1104   HGBUR LARGE (A) 10/06/2020 1350   BILIRUBINUR NEGATIVE 10/06/2020 1350   BILIRUBINUR Negative 01/04/2019 0928   BILIRUBINUR Negative 01/03/2014 1104   KETONESUR NEGATIVE 10/06/2020 1350   PROTEINUR 100 (A) 10/06/2020 1350   UROBILINOGEN 0.2 02/14/2013 1345   NITRITE NEGATIVE 10/06/2020 1350   LEUKOCYTESUR MODERATE (A) 10/06/2020 1350   LEUKOCYTESUR Negative 01/03/2014 1104   Results for orders placed or performed during the hospital encounter of 10/06/20  Resp Panel by RT PCR  (RSV, Flu A&B, Covid) - Urine, Clean Catch     Status: None   Collection Time: 10/06/20  1:50 PM   Specimen: Urine, Clean Catch  Result Value Ref Range Status   SARS Coronavirus 2 by RT PCR NEGATIVE NEGATIVE Final    Comment: (NOTE) SARS-CoV-2 target nucleic acids are NOT DETECTED.  The SARS-CoV-2 RNA is generally detectable in upper respiratoy specimens during the acute phase of infection. The lowest concentration of SARS-CoV-2 viral copies this assay can detect is 131 copies/mL. A negative result does not preclude SARS-Cov-2 infection and should not be used as the sole basis for treatment or other patient management decisions. A negative result may occur with  improper specimen collection/handling, submission of specimen other than nasopharyngeal swab, presence of viral mutation(s) within the areas targeted by this assay,  and inadequate number of viral copies (<131 copies/mL). A negative result must be combined with clinical observations, patient history, and epidemiological information. The expected result is Negative.  Fact Sheet for Patients:  PinkCheek.be  Fact Sheet for Healthcare Providers:  GravelBags.it  This test is no t yet approved or cleared by the [place] FDA and  has been authorized for detection and/or diagnosis of SARS-CoV-2 by FDA under an Emergency Use Authorization (EUA). This EUA will remain  in effect (meaning this test can be used) for the duration of the COVID-19 declaration under Section 564(b)(1) of the Act, 21 U.S.C. section 360bbb-3(b)(1), unless the authorization is terminated or revoked sooner.     Influenza A by PCR NEGATIVE NEGATIVE Final   Influenza B by PCR NEGATIVE NEGATIVE Final    Comment: (NOTE) The Xpert Xpress SARS-CoV-2/FLU/RSV assay is intended as an aid in  the diagnosis of influenza from Nasopharyngeal swab specimens and  should not be used as a sole basis for  treatment. Nasal washings and  aspirates are unacceptable for Xpert Xpress SARS-CoV-2/FLU/RSV  testing.  Fact Sheet for Patients: PinkCheek.be  Fact Sheet for Healthcare Providers: GravelBags.it  This test is not yet approved or cleared by the [place] FDA and  has been authorized for detection and/or diagnosis of SARS-CoV-2 by  FDA under an Emergency Use Authorization (EUA). This EUA will remain  in effect (meaning this test can be used) for the duration of the  Covid-19 declaration under Section 564(b)(1) of the Act, 21  U.S.C. section 360bbb-3(b)(1), unless the authorization is  terminated or revoked.    Respiratory Syncytial Virus by PCR NEGATIVE NEGATIVE Final    Comment: (NOTE) Fact Sheet for Patients: PinkCheek.be  Fact Sheet for Healthcare Providers: GravelBags.it  This test is not yet approved or cleared by the [place] FDA and  has been authorized for detection and/or diagnosis of SARS-CoV-2 by  FDA under an Emergency Use Authorization (EUA). This EUA will remain  in effect (meaning this test can be used) for the duration of the  COVID-19 declaration under Section 564(b)(1) of the Act, 21 U.S.C.  section 360bbb-3(b)(1), unless the authorization is terminated or  revoked. Performed at [facility], [address] [place], [place] [address]     Radiologic Imaging: CT ABDOMEN PELVIS WO CONTRAST  Result Date: 10/06/2020 CLINICAL DATA:  Abdominal distension EXAM: CT ABDOMEN AND PELVIS WITHOUT CONTRAST TECHNIQUE: Multidetector CT imaging of the abdomen and pelvis was performed following the standard protocol without IV contrast. COMPARISON:  CT 09/27/2020 FINDINGS: Lower chest: New small-to-moderate right pleural effusion with right basilar atelectasis and consolidation. There is high-density material within the atelectatic right lower  lobe suggesting aspirated oral contrast (series 2, image 1). New small left pleural effusion with associated mild compressive atelectasis. Heart size within normal limits. Coronary artery calcification. Hepatobiliary: Unenhanced liver is within normal limits. Status post cholecystectomy. Pancreas: No obvious abnormality. Spleen: Unremarkable. Adrenals/Urinary Tract: Stable appearance of the bilateral adrenal glands. Severe left-sided hydronephrosis with marked dilation of the right renal pelvis with abrupt caliber transition at the left ureteropelvic junction. Prominent urothelial thickening. Multiple calcifications within the left renal collecting system are unchanged and may represent vascular calcifications or small stones. Distal left ureter is nondilated. Urinary bladder is moderately distended. Surgically absent right kidney. Stomach/Bowel: Mildly thickened appearance of the visualized distal esophagus. The stomach is markedly dilated. Oral contrast is present within the stomach and small bowel. No evidence of gastric outlet obstruction. The distal duodenum is  extrinsically compressed by the markedly dilated right renal pelvis and adjacent superior mesenteric artery (series 2, image 34). There is a right femoral hernia containing a loop of small bowel. No abnormal upstream dilatation of small bowel to suggest obstruction. There appears to be a conglomeration of completely collapsed small bowel loops within the right lower quadrant (series 2, image 54). The oral contrast has not reached the level of the hernia sac at the time of scanning. Large volume of stool within the colon. No colonic inflammatory changes are seen. Vascular/Lymphatic: Tortuous, ectatic abdominal aorta with extensive atherosclerotic calcifications throughout the aortoiliac axis. Vascular structures are suboptimally evaluated in the absence of IV contrast. No lymphadenopathy identified. Reproductive: Status post hysterectomy. No adnexal  masses. Other: Interval development of small volume ascites. No organized abdominopelvic fluid collection. No pneumoperitoneum. Musculoskeletal: Lumbar dextrocurvature with advanced multilevel lumbar spondylosis. No new or acute osseous findings. IMPRESSION: 1. Markedly distended stomach. Findings are likely related to duodenal obstruction. The distal duodenum is markedly narrowed between the superior mesenteric artery and the severely dilated left renal pelvis. 2. Persistent severe left-sided hydronephrosis with marked dilation of the right renal pelvis with abrupt caliber transition at the left ureteropelvic junction. Prominent urothelial thickening. 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 3. Interval development of small volume ascites. 4. Right femoral hernia containing a loop of small bowel. No abnormal upstream dilatation of small bowel to suggest obstruction. The oral contrast has not reached the level of the hernia sac at the time of scanning. Consider short interval repeat scan to evaluate transit through this location. 5. New small-to-moderate right pleural effusion with right basilar atelectasis and consolidation. There is high-density material within the atelectatic right lower lobe suggesting aspirated oral contrast. 6. New small left pleural effusion with associated mild compressive atelectasis. 7. Mildly thickened appearance of the visualized distal esophagus, which may reflect esophagitis. 8. Large volume of stool within the colon. 9. Aortic atherosclerosis. (ICD10-I70.0). Electronically Signed   By: Davina Poke D.O.   On: 10/06/2020 11:41   Assessment & Plan:  79 year old female with a solitary left kidney and chronic UPJ obstruction with hydronephrosis recently admitted for pyelonephritis subsequently readmitted with gastric distention and duodenal obstruction secondary to extrinsic compression believed secondary to the adjacent dilated left renal pelvis and SMA.  Left  hydronephrosis appears stable compared to prior and patient denies acute worsening in left flank pain.  Her UA does remain grossly infected.  I have ordered an add-on urine culture and recommend starting empiric antibiotics for treatment of persistent versus recurrent pyelonephritis.  Ultimately, patient may benefit from left ureteral stent placement, however it is unclear if this will resolve the duodenal obstruction given the stability of her hydronephrosis.  Dr. Diamantina Providence to discuss further with the patient's son to determine plan this afternoon.  No plans for urgent urologic intervention today.  We will continue to follow.  Recommendations: -Start empiric antibiotics for grossly positive UA and follow urine culture -Plan TBD per conversation with patient and son, possible left ureteral stent placement tomorrow  Thank you for involving me in this patient's care, I will continue to follow along.  Debroah Loop, PA-C 10/07/2020 11:29 AM

## 2020-10-07 NOTE — Progress Notes (Addendum)
PROGRESS NOTE    Cathy Solis   XHB:[id number]  DOB: [birth year]  PCP: Valerie Roys, DO    DOA: 10/06/2020 LOS: 1   Brief Narrative   HPI on admission, by Dr. Posey Pronto: "Cathy Solis is a 79 y.o. 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  Patient was recently hospitalized on 09/27/2020 and discharged on 10/05/2020.  Patient during recent admission was admitted for pyelonephritis, and discharged with antibiotics and outpatient follow-up with urologist, cardiologist and primary care provider.  She was found to have acute Pilo and AKI with culture showing E. coli and discharged on p.o. Levaquin.  Patient was also seen by cardiologist for A. fib RVR and started on Eliquis for CHADS2 score of 6. Prior to previous admission patient was not on home O2 was discharged on 2 L by nasal cannula.  Sister [name] was involved in patient's care and her son [name]."     Assessment & Plan   Principal Problem:   Abdominal pain Active Problems:   Hypothyroidism   Hydronephrosis   Tobacco abuse   Hypertension   Moderate protein-calorie malnutrition (HCC)   Atrial fibrillation (HCC)  Abdominal Pain - due to ?duodenal compression vs severe constipation vs ?PUD / esophagitis.  Imaging on admission concerning for extrinsic compression of duodenum by severe renal pelvis dilation and SMA, markedly distended stomach consistent with this as well. --GI and urology consulted --Clamp NG today, trial of liquid diet --IV Protonix BID --Scheduled MiralLAX by NG tube 1-2 times daily  Acute kidney injury vs CKD - POA.  Hold lisinopril for now.  Monitor BMP.    Acute  vs Recurrent Pyelonephritis - recently admitted for this, as above.  Resume IV Rocephin.  Follow urine culture.  Left hydronephrosis - urology consulted.  Chronic issue that appears stable on imaging from prior.  Considering placement of ureteral stent.  Constipation - scheduled MiraLAX  A-fib - rate controlled.  Continue Eliquis, metoprolol.  Telemetry monitoring.  Essential hypertension - lisinopril is held due to AKI on admission.  Continue metoprolol.  PRN hydralazine IV.  Hypothyroidism - continue levothyroxine.  TSH normal.  Severe protein calorie malnutrition - dietician following.  Oral nutrition supplements per orders.  Follow daily K, Mg, Phos due to high risk for refeeding syndrome.  Body mass index is 18.82 kg/m.   Generalized weakness - discharged to [facility] for short term rehab after prior admission.   Tobacco abuse - nicotine patch   DVT prophylaxis: SCDs Start: 10/06/20 1336.  On Eliquis   Diet:  Diet Orders (From admission, onward)    Start     Ordered   10/07/20 1200  Diet full liquid Room service appropriate? Yes; Fluid consistency: Thin  Diet effective now       Question Answer Comment  Room service appropriate? Yes   Fluid consistency: Thin      10/07/20 1159            Code Status: Full Code    Subjective 10/07/20    Pt seen with sister at bedside today.  Pt reports being hungry, has not eaten in days.  Currently denies abdominal pain, nausea or vomiting with NG tube in place.  Sister reports  that when NG tube taken off suction earlier, patient had a lot of belching / gas recur which got better when NG put back on suction.     Disposition Plan & Communication   Status is: Inpatient  Remains inpatient appropriate because:Inpatient level of care appropriate due to severity of illness with NG tube in place   Dispo: The patient is from: Home              Anticipated d/c is to: Home              Anticipated d/c date is: 3 days               Patient currently is not medically stable to d/c.   Family Communication: sister at bedside on rounds today.  Spoke with son, sister and urology regarding overall plan this evening.   Consults, Procedures, Significant Events   Consultants:   Gastroenterology  Urology  Procedures:   NG tube placed  Antimicrobials:  Anti-infectives (From admission, onward)   Start     Dose/Rate Route Frequency Ordered Stop   10/07/20 1530  cefTRIAXone (ROCEPHIN) 1 g in sodium chloride 0.9 % 100 mL IVPB        1 g 200 mL/hr over 30 Minutes Intravenous Every 24 hours 10/07/20 1435          Objective   Vitals:   10/06/20 2329 10/07/20 0511 10/07/20 0725 10/07/20 1100  BP: 137/76 139/77 (!) 144/70 (!) 157/77  Pulse: 75 81 91 89  Resp: 16 18 16 14   Temp: 98.1 F (36.7 C) 98.1 F (36.7 C) 98.5 F (36.9 C) 98.4 F (36.9 C)  TempSrc:   Oral   SpO2: 96% 98% 94% 97%  Weight:      Height:        Intake/Output Summary (Last 24 hours) at 10/07/2020 1439 Last data filed at 10/07/2020 0400 Gross per 24 hour  Intake 785.77 ml  Output --  Net 785.77 ml   Filed Weights   10/06/20 0822  Weight: 51.3 kg    Physical Exam:  General exam: awake, alert, no acute distress HEENT: NG tube in L nare to suction, hearing grossly normal  Respiratory system: CTAB, no wheezes, rales or rhonchi, normal respiratory effort. Cardiovascular system: normal S1/S2, RRR, no pedal edema.   Gastrointestinal system: soft, NT, ND, unable to appreciate bowel sounds. Central nervous system: no gross focal neurologic deficits, normal speech Extremities: moves all, no cyanosis, normal tone Skin: dry, intact, normal temperature   Labs   Data Reviewed: I have personally reviewed following labs and imaging studies  CBC: Recent Labs  Lab 10/03/20 0105 10/03/20 0342 10/04/20 0613 10/06/20 0828 10/07/20 0342  WBC 9.8 8.6 7.7 14.7* 10.7*  NEUTROABS  --   --  5.6 12.1*  --   HGB 12.0 13.1 11.5* 12.4 11.3*   HCT 35.9* 38.8 35.2* 37.2 34.3*  MCV 90.4 89.8 92.1 92.3 92.2  PLT 382 416* 343 415* 676   Basic Metabolic Panel: Recent Labs  Lab 10/01/20 0819 10/01/20 0819 10/02/20 0430 10/03/20 0105 10/04/20 0613 10/06/20 0828 10/07/20 0342  NA 132*   < > 132* 131* 132* 132* 132*  K 3.7   < > 3.6 3.5 3.8 3.9 4.3  CL 105   < > 102 99 100 97* 99  CO2 19*   < > 22 20* 24 25 24   GLUCOSE 105*   < > 104* 150* 111* 124* 85  BUN 31*   < >  28* 36* 30* 27* 25*  CREATININE 1.48*   < > 1.35* 1.60* 1.56* 1.56* 1.47*  CALCIUM 8.7*   < > 8.6* 8.6* 8.6* 8.5* 8.4*  MG 1.8  --   --   --   --   --   --   PHOS 2.4*  --   --   --   --   --   --    < > = values in this interval not displayed.   GFR: Estimated Creatinine Clearance: 24.3 mL/min (A) (by C-G formula based on SCr of 1.47 mg/dL (H)). Liver Function Tests: Recent Labs  Lab 10/04/20 0613 10/06/20 0828 10/07/20 0342  AST 13* 13* 12*  ALT 9 10 9   ALKPHOS 45 47 40  BILITOT 0.3 0.5 0.5  PROT 5.7* 6.3* 5.6*  ALBUMIN 2.1* 2.4* 2.1*   Recent Labs  Lab 10/06/20 0828  LIPASE 45   No results for input(s): AMMONIA in the last 168 hours. Coagulation Profile: Recent Labs  Lab 10/06/20 0838  INR 1.1   Cardiac Enzymes: No results for input(s): CKTOTAL, CKMB, CKMBINDEX, TROPONINI in the last 168 hours. BNP (last 3 results) No results for input(s): PROBNP in the last 8760 hours. HbA1C: No results for input(s): HGBA1C in the last 72 hours. CBG: No results for input(s): GLUCAP in the last 168 hours. Lipid Profile: No results for input(s): CHOL, HDL, LDLCALC, TRIG, CHOLHDL, LDLDIRECT in the last 72 hours. Thyroid Function Tests: No results for input(s): TSH, T4TOTAL, FREET4, T3FREE, THYROIDAB in the last 72 hours. Anemia Panel: No results for input(s): VITAMINB12, FOLATE, FERRITIN, TIBC, IRON, RETICCTPCT in the last 72 hours. Sepsis Labs: Recent Labs  Lab 10/06/20 2229  LATICACIDVEN 1.0    Recent Results (from the past 240 hour(s))   SARS Coronavirus 2 by RT PCR (hospital order, performed in [facility] hospital lab) Nasopharyngeal Nasopharyngeal Swab     Status: None   Collection Time: 10/05/20  1:02 PM   Specimen: Nasopharyngeal Swab  Result Value Ref Range Status   SARS Coronavirus 2 NEGATIVE NEGATIVE Final    Comment: (NOTE) SARS-CoV-2 target nucleic acids are NOT DETECTED.  The SARS-CoV-2 RNA is generally detectable in upper and lower respiratory specimens during the acute phase of infection. The lowest concentration of SARS-CoV-2 viral copies this assay can detect is 250 copies / mL. A negative result does not preclude SARS-CoV-2 infection and should not be used as the sole basis for treatment or other patient management decisions.  A negative result may occur with improper specimen collection / handling, submission of specimen other than nasopharyngeal swab, presence of viral mutation(s) within the areas targeted by this assay, and inadequate number of viral copies (<250 copies / mL). A negative result must be combined with clinical observations, patient history, and epidemiological information.  Fact Sheet for Patients:   StrictlyIdeas.no  Fact Sheet for Healthcare Providers: BankingDealers.co.za  This test is not yet approved or  cleared by the [place] FDA and has been authorized for detection and/or diagnosis of SARS-CoV-2 by FDA under an Emergency Use Authorization (EUA).  This EUA will remain in effect (meaning this test can be used) for the duration of the COVID-19 declaration under Section 564(b)(1) of the Act, 21 U.S.C. section 360bbb-3(b)(1), unless the authorization is terminated or revoked sooner.  Performed at [facility], [address] [place], [place] [address]   Resp Panel by RT PCR (RSV, Flu A&B, Covid) - Urine, Clean Catch     Status: None  Collection Time: 10/06/20  1:50 PM   Specimen: Urine, Clean Catch  Result  Value Ref Range Status   SARS Coronavirus 2 by RT PCR NEGATIVE NEGATIVE Final    Comment: (NOTE) SARS-CoV-2 target nucleic acids are NOT DETECTED.  The SARS-CoV-2 RNA is generally detectable in upper respiratoy specimens during the acute phase of infection. The lowest concentration of SARS-CoV-2 viral copies this assay can detect is 131 copies/mL. A negative result does not preclude SARS-Cov-2 infection and should not be used as the sole basis for treatment or other patient management decisions. A negative result may occur with  improper specimen collection/handling, submission of specimen other than nasopharyngeal swab, presence of viral mutation(s) within the areas targeted by this assay, and inadequate number of viral copies (<131 copies/mL). A negative result must be combined with clinical observations, patient history, and epidemiological information. The expected result is Negative.  Fact Sheet for Patients:  PinkCheek.be  Fact Sheet for Healthcare Providers:  GravelBags.it  This test is no t yet approved or cleared by the [place] FDA and  has been authorized for detection and/or diagnosis of SARS-CoV-2 by FDA under an Emergency Use Authorization (EUA). This EUA will remain  in effect (meaning this test can be used) for the duration of the COVID-19 declaration under Section 564(b)(1) of the Act, 21 U.S.C. section 360bbb-3(b)(1), unless the authorization is terminated or revoked sooner.     Influenza A by PCR NEGATIVE NEGATIVE Final   Influenza B by PCR NEGATIVE NEGATIVE Final    Comment: (NOTE) The Xpert Xpress SARS-CoV-2/FLU/RSV assay is intended as an aid in  the diagnosis of influenza from Nasopharyngeal swab specimens and  should not be used as a sole basis for treatment. Nasal washings and  aspirates are unacceptable for Xpert Xpress SARS-CoV-2/FLU/RSV  testing.  Fact Sheet for  Patients: PinkCheek.be  Fact Sheet for Healthcare Providers: GravelBags.it  This test is not yet approved or cleared by the [place] FDA and  has been authorized for detection and/or diagnosis of SARS-CoV-2 by  FDA under an Emergency Use Authorization (EUA). This EUA will remain  in effect (meaning this test can be used) for the duration of the  Covid-19 declaration under Section 564(b)(1) of the Act, 21  U.S.C. section 360bbb-3(b)(1), unless the authorization is  terminated or revoked.    Respiratory Syncytial Virus by PCR NEGATIVE NEGATIVE Final    Comment: (NOTE) Fact Sheet for Patients: PinkCheek.be  Fact Sheet for Healthcare Providers: GravelBags.it  This test is not yet approved or cleared by the [place] FDA and  has been authorized for detection and/or diagnosis of SARS-CoV-2 by  FDA under an Emergency Use Authorization (EUA). This EUA will remain  in effect (meaning this test can be used) for the duration of the  COVID-19 declaration under Section 564(b)(1) of the Act, 21 U.S.C.  section 360bbb-3(b)(1), unless the authorization is terminated or  revoked. Performed at [facility], [address] [place], [place] [address]       Imaging Studies   CT ABDOMEN PELVIS WO CONTRAST  Result Date: 10/06/2020 CLINICAL DATA:  Abdominal distension EXAM: CT ABDOMEN AND PELVIS WITHOUT CONTRAST TECHNIQUE: Multidetector CT imaging of the abdomen and pelvis was performed following the standard protocol without IV contrast. COMPARISON:  CT 09/27/2020 FINDINGS: Lower chest: New small-to-moderate right pleural effusion with right basilar atelectasis and consolidation. There is high-density material within the atelectatic right lower lobe suggesting aspirated oral contrast (series 2, image 1). New small left pleural  effusion with associated mild  compressive atelectasis. Heart size within normal limits. Coronary artery calcification. Hepatobiliary: Unenhanced liver is within normal limits. Status post cholecystectomy. Pancreas: No obvious abnormality. Spleen: Unremarkable. Adrenals/Urinary Tract: Stable appearance of the bilateral adrenal glands. Severe left-sided hydronephrosis with marked dilation of the right renal pelvis with abrupt caliber transition at the left ureteropelvic junction. Prominent urothelial thickening. Multiple calcifications within the left renal collecting system are unchanged and may represent vascular calcifications or small stones. Distal left ureter is nondilated. Urinary bladder is moderately distended. Surgically absent right kidney. Stomach/Bowel: Mildly thickened appearance of the visualized distal esophagus. The stomach is markedly dilated. Oral contrast is present within the stomach and small bowel. No evidence of gastric outlet obstruction. The distal duodenum is extrinsically compressed by the markedly dilated right renal pelvis and adjacent superior mesenteric artery (series 2, image 34). There is a right femoral hernia containing a loop of small bowel. No abnormal upstream dilatation of small bowel to suggest obstruction. There appears to be a conglomeration of completely collapsed small bowel loops within the right lower quadrant (series 2, image 54). The oral contrast has not reached the level of the hernia sac at the time of scanning. Large volume of stool within the colon. No colonic inflammatory changes are seen. Vascular/Lymphatic: Tortuous, ectatic abdominal aorta with extensive atherosclerotic calcifications throughout the aortoiliac axis. Vascular structures are suboptimally evaluated in the absence of IV contrast. No lymphadenopathy identified. Reproductive: Status post hysterectomy. No adnexal masses. Other: Interval development of small volume ascites. No organized abdominopelvic fluid collection. No  pneumoperitoneum. Musculoskeletal: Lumbar dextrocurvature with advanced multilevel lumbar spondylosis. No new or acute osseous findings. IMPRESSION: 1. Markedly distended stomach. Findings are likely related to duodenal obstruction. The distal duodenum is markedly narrowed between the superior mesenteric artery and the severely dilated left renal pelvis. 2. Persistent severe left-sided hydronephrosis with marked dilation of the right renal pelvis with abrupt caliber transition at the left ureteropelvic junction. Prominent urothelial thickening. 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 3. Interval development of small volume ascites. 4. Right femoral hernia containing a loop of small bowel. No abnormal upstream dilatation of small bowel to suggest obstruction. The oral contrast has not reached the level of the hernia sac at the time of scanning. Consider short interval repeat scan to evaluate transit through this location. 5. New small-to-moderate right pleural effusion with right basilar atelectasis and consolidation. There is high-density material within the atelectatic right lower lobe suggesting aspirated oral contrast. 6. New small left pleural effusion with associated mild compressive atelectasis. 7. Mildly thickened appearance of the visualized distal esophagus, which may reflect esophagitis. 8. Large volume of stool within the colon. 9. Aortic atherosclerosis. (ICD10-I70.0). Electronically Signed   By: Davina Poke D.O.   On: 10/06/2020 11:41   DG Abd Portable 1 View  Result Date: 10/06/2020 CLINICAL DATA:  NG placement EXAM: PORTABLE ABDOMEN - 1 VIEW COMPARISON:  None. FINDINGS: Enteric tube passes into the stomach. Bowel gas pattern is not well evaluated. Dextroscoliosis of upper lumbar spine. IMPRESSION: Enteric tube within the stomach. Electronically Signed   By: Macy Mis M.D.   On: 10/06/2020 14:59     Medications   Scheduled Meds: . feeding supplement  237 mL Oral BID BM   . fluticasone furoate-vilanterol  1 puff Inhalation Daily  . ipratropium-albuterol  3 mL Nebulization BID  . [START ON 10/10/2020] levothyroxine  112 mcg Intravenous Daily  . metoprolol tartrate  5 mg Intravenous Q6H  . nicotine  21 mg Transdermal Daily  . ondansetron (ZOFRAN) IV  4 mg Intravenous Once  . pantoprazole (PROTONIX) IV  40 mg Intravenous Q12H  . polyethylene glycol  17 g Per NG tube Daily  . sucralfate  1 g Per Tube TID WC & HS   Continuous Infusions: . cefTRIAXone (ROCEPHIN)  IV         LOS: 1 day    Time spent: 30 minutes    Ezekiel Slocumb, DO [facility]  10/07/2020, 2:39 PM    If 7PM-7AM, please contact night-coverage. How to contact the [facility] Attending or Consulting provider [place] or covering provider during after hours [place], for this patient?    1. Check the care team in [facility] and look for a) attending/consulting [facility] provider listed and b) the [facility] team listed 2. Log into [email or web address] and use [place]'s universal password to access. If you do not have the password, please contact the hospital operator. 3. Locate the [facility] provider you are looking for under [facility] and page to a number that you can be directly reached. 4. If you still have difficulty reaching the provider, please page the [facility] (Director on Call) for the Hospitalists listed on amion for assistance.

## 2020-10-07 NOTE — Hospital Course (Signed)
HPI on admission, by Dr. Posey Pronto: "Cathy Solis is a 79 y.o. 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  Patient was recently hospitalized on 09/27/2020 and discharged on 10/05/2020.  Patient during recent admission was admitted for pyelonephritis, and discharged with antibiotics and outpatient follow-up with urologist, cardiologist and primary care provider.  She was found to have acute Pilo and AKI with culture showing E. coli and discharged on p.o. Levaquin.  Patient was also seen by cardiologist for A. fib RVR and started on Eliquis for CHADS2 score of 6. Prior to previous admission patient was not on home O2 was discharged on 2 L by nasal cannula.  Sister [name] was involved in patient's care and her son [name]."

## 2020-10-07 NOTE — TOC Initial Note (Signed)
Transition of Care [facility]) - Initial/Assessment Note    Patient Details  Name: Cathy Solis MRN: [id number] Date of Birth: 06-03-1941  Transition of Care [facility]) CM/SW Contact:    Shelbie Hutching, RN Phone Number: 10/07/2020, 10:50 AM  Clinical Narrative:                 Patient discharged from the hospital on 11/15 to [facility] for short term rehab.  Patient returned to the hospital yesterday with abdominal pain due to duodenal obstruction.  GI and Urology both consulted.  Patient currently is NPO and has a NG tube for decompression.  Patient's sister Shauna Hugh is at the bedside this morning.  RNCM was able to meet with patient and sister to discuss discharge planning.  Plan will be for patient to return to [facility], unsure of discharge date at this time, patient will likely require urological intervention.   TOC team to continue to follow.    Expected Discharge Plan: [place] Barriers to Discharge: Continued Medical Work up   Patient Goals and CMS Choice Patient states their goals for this hospitalization and ongoing recovery are:: To return to [facility] for rehab [employer].gov Compare Post Acute Care list provided to:: Patient Represenative (must comment) Choice offered to / list presented to : Adult Children  Expected Discharge Plan and Services Expected Discharge Plan: [place]   Discharge Planning Services: CM Consult Post Acute Care Choice: [place] Living arrangements for the past 2 months: Single Family Home                   DME Agency: NA       HH Arranged: NA          Prior Living Arrangements/Services Living arrangements for the past 2 months: Single Family Home Lives with:: Self Patient language and need for interpreter reviewed:: Yes Do you feel safe going back to the place where you live?: Yes      Need for Family Participation in Patient Care: Yes (Comment) (weakness, deconditioned) Care  giver support system in place?: Yes (comment) (family) Current home services: DME (walker and cane) Criminal Activity/Legal Involvement Pertinent to Current Situation/Hospitalization: No - Comment as needed  Activities of Daily Living Home Assistive Devices/Equipment: None ADL Screening (condition at time of admission) Patient's cognitive ability adequate to safely complete daily activities?: Yes Is the patient deaf or have difficulty hearing?: No Does the patient have difficulty seeing, even when wearing glasses/contacts?: No Does the patient have difficulty concentrating, remembering, or making decisions?: No Patient able to express need for assistance with ADLs?: Yes Does the patient have difficulty dressing or bathing?: Yes Independently performs ADLs?: No Communication: Independent Dressing (OT): Needs assistance Is this a change from baseline?: Pre-admission baseline Grooming: Needs assistance Is this a change from baseline?: Pre-admission baseline Feeding: Independent Bathing: Needs assistance Is this a change from baseline?: Pre-admission baseline Toileting: Needs assistance Is this a change from baseline?: Pre-admission baseline In/Out Bed: Needs assistance Is this a change from baseline?: Pre-admission baseline Walks in Home: Needs assistance Is this a change from baseline?: Pre-admission baseline Does the patient have difficulty walking or climbing stairs?: Yes Weakness of Legs: Both Weakness of Arms/Hands: Both  Permission Sought/Granted Permission sought to share information with : Case Manager, Customer service manager, Family Supports Permission granted to share information with : Yes, Verbal Permission Granted  Share Information with NAME: Gerlene Burdock, and [name]  Permission granted to share info w  AGENCY: Dietitian granted to share info w Relationship: sisters and son     Emotional Assessment Appearance:: Appears stated  age Attitude/Demeanor/Rapport: Lethargic Affect (typically observed): Withdrawn Orientation: : Oriented to Self, Oriented to Place, Oriented to  Time, Oriented to Situation Alcohol / Substance Use: Not Applicable Psych Involvement: No (comment)  Admission diagnosis:  Duodenal obstruction [K31.5] Abdominal pain [R10.9] Patient Active Problem List   Diagnosis Date Noted  . Abdominal pain 10/06/2020  . Hematemesis with nausea   . Upper GI bleed   . UTI (urinary tract infection) 09/27/2020  . Acute pyelonephritis 09/27/2020  . Unintentional weight loss 09/27/2020  . Debility 09/27/2020  . Atrial fibrillation ([place])   . Moderate protein-calorie malnutrition ([place]) 09/03/2020  . 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 Chronic kidney disease (CKD), stage III (moderate) ([place]) 08/20/2019  . RBBB 05/09/2019  . Pressure injury of skin 01/15/2019  . COPD (chronic obstructive pulmonary disease) ([place]) 01/14/2019  . Hypertension 05/04/2018  . Osteoporosis 11/02/2017  . Coronary atherosclerosis 05/23/2016  . Personal history of tobacco use, presenting hazards to health 03/18/2016  . Idiopathic scoliosis 11/28/2014  . Tobacco abuse 11/28/2014  . 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 Hydronephrosis 02/04/2014  . 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 Carotid stenosis 05/23/2013  . Pernicious anemia 06/26/2012  . Hyperlipidemia 12/29/2011  . 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 Hypothyroidism 08/04/2011   PCP:  Valerie Roys, DO Pharmacy:   [facility], [place], [place] [address] Phone: [phone] Fax: [phone]  [id number] [place], [place] [address] [place] [address] Phone: [phone] Fax: [phone]     Social Determinants of Health (SDOH) Interventions    Readmission Risk Interventions No flowsheet data found.

## 2020-10-08 ENCOUNTER — Inpatient Hospital Stay: Payer: Medicare HMO

## 2020-10-08 ENCOUNTER — Ambulatory Visit: Payer: Medicare HMO | Admitting: Physician Assistant

## 2020-10-08 ENCOUNTER — Inpatient Hospital Stay: Payer: Medicare HMO | Admitting: Anesthesiology

## 2020-10-08 ENCOUNTER — Encounter
Admission: EM | Disposition: A | Discharge status: Skilled Nursing Facility | Payer: Self-pay | Source: Skilled Nursing Facility | Attending: Internal Medicine

## 2020-10-08 DIAGNOSIS — I10 Essential (primary) hypertension: Secondary | ICD-10-CM | POA: Diagnosis not present

## 2020-10-08 DIAGNOSIS — E43 Unspecified severe protein-calorie malnutrition: Secondary | ICD-10-CM | POA: Diagnosis not present

## 2020-10-08 DIAGNOSIS — N135 Crossing vessel and stricture of ureter without hydronephrosis: Secondary | ICD-10-CM

## 2020-10-08 DIAGNOSIS — Q6211 Congenital occlusion of ureteropelvic junction: Secondary | ICD-10-CM | POA: Diagnosis not present

## 2020-10-08 DIAGNOSIS — R1012 Left upper quadrant pain: Secondary | ICD-10-CM | POA: Diagnosis not present

## 2020-10-08 HISTORY — PX: CYSTOSCOPY WITH STENT PLACEMENT: SHX5790

## 2020-10-08 LAB — BASIC METABOLIC PANEL
Anion gap: 10 (ref 5–15)
BUN: 24 mg/dL — ABNORMAL HIGH (ref 8–23)
CO2: 25 mmol/L (ref 22–32)
Calcium: 8.3 mg/dL — ABNORMAL LOW (ref 8.9–10.3)
Chloride: 98 mmol/L (ref 98–111)
Creatinine, Ser: 1.29 mg/dL — ABNORMAL HIGH (ref 0.44–1.00)
GFR, Estimated: 42 mL/min — ABNORMAL LOW (ref >60)
Glucose, Bld: 70 mg/dL (ref 70–99)
Potassium: 3.8 mmol/L (ref 3.5–5.1)
Sodium: 133 mmol/L — ABNORMAL LOW (ref 135–145)

## 2020-10-08 LAB — URINE CULTURE: Culture: NO GROWTH

## 2020-10-08 LAB — MAGNESIUM: Magnesium: 1.7 mg/dL (ref 1.7–2.4)

## 2020-10-08 LAB — CBC
HCT: 38.2 % (ref 36.0–46.0)
Hemoglobin: 12.6 g/dL (ref 12.0–15.0)
MCH: 30.4 pg (ref 26.0–34.0)
MCHC: 33 g/dL (ref 30.0–36.0)
MCV: 92.3 fL (ref 80.0–100.0)
Platelets: 363 10*3/uL (ref 150–400)
RBC: 4.14 MIL/uL (ref 3.87–5.11)
RDW: 13.2 % (ref 11.5–15.5)
WBC: 11.2 10*3/uL — ABNORMAL HIGH (ref 4.0–10.5)
nRBC: 0 % (ref 0.0–0.2)

## 2020-10-08 LAB — PHOSPHORUS: Phosphorus: 2.8 mg/dL (ref 2.5–4.6)

## 2020-10-08 SURGERY — CYSTOSCOPY, WITH STENT INSERTION
Anesthesia: General | Laterality: Left

## 2020-10-08 MED ORDER — IOHEXOL 180 MG/ML  SOLN
INTRAMUSCULAR | Status: DC | PRN
  Administered 2020-10-08: 20 mL

## 2020-10-08 MED ORDER — MAGIC MOUTHWASH W/LIDOCAINE: 10.0000 mL | Freq: Four times a day (QID) | ORAL | Status: DC

## 2020-10-08 MED ORDER — LIDOCAINE VISCOUS HCL 2 % MT SOLN
5.0000 mL | Freq: Four times a day (QID) | OROMUCOSAL | Status: DC
  Administered 2020-10-08 – 2020-10-20 (×25): 5 mL via OROMUCOSAL
  Filled 2020-10-08 (×54): qty 15

## 2020-10-08 MED ORDER — CHLORHEXIDINE GLUCONATE CLOTH 2 % EX PADS
6.0000 | MEDICATED_PAD | Freq: Every day | CUTANEOUS | Status: DC
  Administered 2020-10-08 – 2020-10-21 (×14): 6 via TOPICAL

## 2020-10-08 MED ORDER — MAGIC MOUTHWASH
5.0000 mL | Freq: Four times a day (QID) | ORAL | Status: DC
  Administered 2020-10-08 – 2020-10-20 (×35): 5 mL via ORAL
  Filled 2020-10-08 (×36): qty 5

## 2020-10-08 MED ORDER — PROPOFOL 10 MG/ML IV BOLUS
INTRAVENOUS | Status: DC | PRN
  Administered 2020-10-08: 20 mg via INTRAVENOUS
  Administered 2020-10-08 (×3): 10 mg via INTRAVENOUS
  Administered 2020-10-08: 20 mg via INTRAVENOUS
  Administered 2020-10-08: 10 mg via INTRAVENOUS

## 2020-10-08 MED ORDER — PHENOL 1.4 % MT LIQD
1.0000 | OROMUCOSAL | Status: DC | PRN
  Administered 2020-10-08: 18:00:00 1 via OROMUCOSAL
  Filled 2020-10-08: qty 177

## 2020-10-08 MED ORDER — LACTATED RINGERS IV SOLN: INTRAVENOUS | Status: DC | PRN

## 2020-10-08 MED ORDER — APIXABAN 2.5 MG PO TABS
2.5000 mg | ORAL_TABLET | Freq: Two times a day (BID) | ORAL | Status: DC
  Administered 2020-10-08 – 2020-10-21 (×26): 2.5 mg via ORAL
  Filled 2020-10-08 (×29): qty 1

## 2020-10-08 MED ORDER — LIDOCAINE HCL (CARDIAC) PF 100 MG/5ML IV SOSY
PREFILLED_SYRINGE | INTRAVENOUS | Status: DC | PRN
  Administered 2020-10-08: 80 mg via INTRATRACHEAL

## 2020-10-08 SURGICAL SUPPLY — 25 items
BAG DRAIN CYSTO-URO LG1000N (MISCELLANEOUS) ×2 IMPLANT
BRUSH SCRUB EZ 1% IODOPHOR (MISCELLANEOUS) IMPLANT
CATH FOL 2WAY LX 18X30 (CATHETERS) ×1 IMPLANT
CATH URETL 5X70 OPEN END (CATHETERS) ×2 IMPLANT
CONRAY 43 FOR UROLOGY 50M (MISCELLANEOUS) ×2 IMPLANT
GLOVE BIOGEL PI IND STRL 7.5 (GLOVE) ×1 IMPLANT
GLOVE BIOGEL PI INDICATOR 7.5 (GLOVE) ×1
GOWN STRL REUS W/ TWL LRG LVL3 (GOWN DISPOSABLE) ×1 IMPLANT
GOWN STRL REUS W/ TWL XL LVL3 (GOWN DISPOSABLE) ×1 IMPLANT
GOWN STRL REUS W/TWL LRG LVL3 (GOWN DISPOSABLE) ×2
GOWN STRL REUS W/TWL XL LVL3 (GOWN DISPOSABLE) ×2
GUIDEWIRE GREEN .038 145CM (MISCELLANEOUS) ×1 IMPLANT
GUIDEWIRE STR DUAL SENSOR (WIRE) ×2 IMPLANT
KIT TURNOVER CYSTO (KITS) ×2 IMPLANT
MANIFOLD NEPTUNE II (INSTRUMENTS) ×2 IMPLANT
PACK CYSTO AR (MISCELLANEOUS) ×2 IMPLANT
SET CYSTO W/LG BORE CLAMP LF (SET/KITS/TRAYS/PACK) ×2 IMPLANT
SOL .9 NS 3000ML IRR  AL (IV SOLUTION) ×2
SOL .9 NS 3000ML IRR UROMATIC (IV SOLUTION) ×1 IMPLANT
STENT URET 6FRX24 CONTOUR (STENTS) IMPLANT
STENT URO INLAY 6FRX26CM (STENTS) ×1 IMPLANT
SURGILUBE 2OZ TUBE FLIPTOP (MISCELLANEOUS) ×2 IMPLANT
SYR TOOMEY IRRIG 70ML (MISCELLANEOUS) ×2
SYRINGE TOOMEY IRRIG 70ML (MISCELLANEOUS) IMPLANT
WATER STERILE IRR 1000ML POUR (IV SOLUTION) ×2 IMPLANT

## 2020-10-08 NOTE — Progress Notes (Signed)
PROGRESS NOTE    Cathy Solis   EGB:[id number]  DOB: Jan 29, 1941  PCP: Cathy Roys, DO    DOA: 10/06/2020 LOS: 2   Brief Narrative   HPI on admission, by Dr. Posey Pronto: "Cathy Solis is a 79 y.o. 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  Patient was recently hospitalized on 09/27/2020 and discharged on 10/05/2020.  Patient during recent admission was admitted for pyelonephritis, and discharged with antibiotics and outpatient follow-up with urologist, cardiologist and primary care provider.  She was found to have acute [place] and AKI with culture showing E. coli and discharged on p.o. Levaquin.  Patient was also seen by cardiologist for A. fib RVR and started on Eliquis for CHADS2 score of 6. Prior to previous admission patient was not on home O2 was discharged on 2 L by nasal cannula.  Sister Cathy Solis was involved in patient's care and her son Cathy Solis."     Assessment & Plan   Principal Problem:   Abdominal pain Active Problems:   Hypothyroidism   Hydronephrosis   Tobacco abuse   Hypertension   Severe protein-calorie malnutrition (HCC)   Atrial fibrillation (HCC)  Abdominal Pain - due to ?duodenal compression vs severe constipation vs ?PUD / esophagitis.  Imaging on admission concerning for extrinsic compression of duodenum by severe renal pelvis dilation and SMA, markedly distended stomach consistent with this as well. --GI and urology consulted --Clamped NG --Continue liquid diet --IV Protonix BID --Scheduled MiralLAX by NG tube 1-2 times daily  Acute kidney injury vs CKD - POA.  Hold lisinopril for now.  Monitor BMP.    Acute vs  Recurrent Pyelonephritis - recently admitted for this, as above.  Resume IV Rocephin.  Follow urine culture.  Left hydronephrosis - urology consulted.  Chronic issue that appears stable on imaging from prior.  Status post Left ureteral stent placement with Dr. Diamantina Providence on 11/18.   Maintain Foley at least 48 hours until clinically improving.  Continue IV antibiotics as above. Urology anticipates long-term stent exchanges every 6-12 months.  Follow up in urology clinic in 1 month.  Constipation - scheduled MiraLAX  A-fib - rate controlled.  Continue Eliquis, metoprolol.  Telemetry monitoring.  Essential hypertension - lisinopril is held due to AKI on admission.  Continue metoprolol.  PRN hydralazine IV.  Hypothyroidism - continue levothyroxine.  TSH normal.  Severe protein calorie malnutrition - dietician following.  Oral nutrition supplements per orders.  Follow daily K, Mg, Phos due to high risk for refeeding syndrome.  Body mass index is 18.82 kg/m.   Generalized weakness - discharged to [facility] for short term rehab after prior admission.  PT and OT evaluations this admission are pending.  Hx of tobacco abuse - nicotine patch discontinued at patient request, does not smoke.    DVT prophylaxis: SCDs Start: 10/06/20 1336.  On Eliquis   Diet:  Diet Orders (From admission, onward)    Start     Ordered   10/07/20 1200  Diet full liquid Room service appropriate? Yes; Fluid consistency: Thin  Diet effective now       Question Answer Comment  Room service appropriate? Yes   Fluid consistency: Thin      10/07/20 1159  Code Status: Full Code    Subjective 10/08/20    Pt seen with sister at bedside today.  Pt had just returned from OR where urology placed a ureteral stent.  Pt currently denies pain, does not recall anything happening.  Started on liquids yesterday, but can't say how well she tolerated.     Disposition Plan & Communication   Status is:  Inpatient  Remains inpatient appropriate because:Inpatient level of care appropriate due to severity of illness with NG tube in place, inadequate oral intake, on IV antibiotics.   Dispo: The patient is from: Home              Anticipated d/c is to: Home              Anticipated d/c date is: 3 days              Patient currently is not medically stable to d/c.   Family Communication: sister at bedside on rounds today.     Consults, Procedures, Significant Events   Consultants:   Gastroenterology  Urology  Procedures:   NG tube placed  Ureteral stent placement 11/18  Antimicrobials:  Anti-infectives (From admission, onward)   Start     Dose/Rate Route Frequency Ordered Stop   10/07/20 1530  cefTRIAXone (ROCEPHIN) 1 g in sodium chloride 0.9 % 100 mL IVPB        1 g 200 mL/hr over 30 Minutes Intravenous Every 24 hours 10/07/20 1435          Objective   Vitals:   10/08/20 0843 10/08/20 0858 10/08/20 0927 10/08/20 1143  BP: (!) 152/92 138/70 (!) 152/68 121/66  Pulse: 90 (!) 59 88 67  Resp: (!) 23 18 16 16   Temp:  98.2 F (36.8 C) 97.9 F (36.6 C) 98.6 F (37 C)  TempSrc:   Oral Oral  SpO2: 95% 95% 96% 96%  Weight:      Height:        Intake/Output Summary (Last 24 hours) at 10/08/2020 1612 Last data filed at 10/08/2020 1402 Gross per 24 hour  Intake 696.29 ml  Output --  Net 696.29 ml   Filed Weights   10/06/20 0822  Weight: 51.3 kg    Physical Exam:  General exam: awake, alert, no acute distress, cachectic HEENT: NG tube in L nare clamped, hearing grossly normal  Respiratory system: CTAB, no wheezes, rales or rhonchi, normal respiratory effort. Cardiovascular system: normal S1/S2, RRR, no pedal edema.   Gastrointestinal system: soft, NT, ND, hypoactive bowel sounds   Labs   Data Reviewed: I have personally reviewed following labs and imaging studies  CBC: Recent Labs  Lab 10/03/20 0342 10/04/20 0613 10/06/20 0828 10/07/20 0342  10/08/20 0531  WBC 8.6 7.7 14.7* 10.7* 11.2*  NEUTROABS  --  5.6 12.1*  --   --   HGB 13.1 11.5* 12.4 11.3* 12.6  HCT 38.8 35.2* 37.2 34.3* 38.2  MCV 89.8 92.1 92.3 92.2 92.3  PLT 416* 343 415* 358 902   Basic Metabolic Panel: Recent Labs  Lab 10/03/20 0105 10/04/20 0613 10/06/20 0828 10/07/20 0342 10/08/20 0531  NA 131* 132* 132* 132* 133*  K 3.5 3.8 3.9 4.3 3.8  CL 99 100 97* 99 98  CO2 20* 24 25 24 25   GLUCOSE 150* 111* 124* 85 70  BUN 36* 30* 27* 25* 24*  CREATININE 1.60* 1.56* 1.56* 1.47* 1.29*  CALCIUM 8.6* 8.6* 8.5* 8.4* 8.3*  MG  --   --   --   --  1.7  PHOS  --   --   --   --  2.8   GFR: Estimated Creatinine Clearance: 27.7 mL/min (A) (by C-G formula based on SCr of 1.29 mg/dL (H)). Liver Function Tests: Recent Labs  Lab 10/04/20 0613 10/06/20 0828 10/07/20 0342  AST 13* 13* 12*  ALT 9 10 9   ALKPHOS 45 47 40  BILITOT 0.3 0.5 0.5  PROT 5.7* 6.3* 5.6*  ALBUMIN 2.1* 2.4* 2.1*   Recent Labs  Lab 10/06/20 0828  LIPASE 45   No results for input(s): AMMONIA in the last 168 hours. Coagulation Profile: Recent Labs  Lab 10/06/20 0838  INR 1.1   Cardiac Enzymes: No results for input(s): CKTOTAL, CKMB, CKMBINDEX, TROPONINI in the last 168 hours. BNP (last 3 results) No results for input(s): PROBNP in the last 8760 hours. HbA1C: No results for input(s): HGBA1C in the last 72 hours. CBG: No results for input(s): GLUCAP in the last 168 hours. Lipid Profile: No results for input(s): CHOL, HDL, LDLCALC, TRIG, CHOLHDL, LDLDIRECT in the last 72 hours. Thyroid Function Tests: No results for input(s): TSH, T4TOTAL, FREET4, T3FREE, THYROIDAB in the last 72 hours. Anemia Panel: No results for input(s): VITAMINB12, FOLATE, FERRITIN, TIBC, IRON, RETICCTPCT in the last 72 hours. Sepsis Labs: Recent Labs  Lab 10/06/20 4656  LATICACIDVEN 1.0    Recent Results (from the past 240 hour(s))  SARS Coronavirus 2 by RT PCR (hospital order, performed in [facility]  hospital lab) Nasopharyngeal Nasopharyngeal Swab     Status: None   Collection Time: 10/05/20  1:02 PM   Specimen: Nasopharyngeal Swab  Result Value Ref Range Status   SARS Coronavirus 2 NEGATIVE NEGATIVE Final    Comment: (NOTE) SARS-CoV-2 target nucleic acids are NOT DETECTED.  The SARS-CoV-2 RNA is generally detectable in upper and lower respiratory specimens during the acute phase of infection. The lowest concentration of SARS-CoV-2 viral copies this assay can detect is 250 copies / mL. A negative result does not preclude SARS-CoV-2 infection and should not be used as the sole basis for treatment or other patient management decisions.  A negative result may occur with improper specimen collection / handling, submission of specimen other than nasopharyngeal swab, presence of viral mutation(s) within the areas targeted by this assay, and inadequate number of viral copies (<250 copies / mL). A negative result must be combined with clinical observations, patient history, and epidemiological information.  Fact Sheet for Patients:   StrictlyIdeas.no  Fact Sheet for Healthcare Providers: BankingDealers.co.za  This test is not yet approved or  cleared by the [place] FDA and has been authorized for detection and/or diagnosis of SARS-CoV-2 by FDA under an Emergency Use Authorization (EUA).  This EUA will remain in effect (meaning this test can be used) for the duration of the COVID-19 declaration under Section 564(b)(1) of the Act, 21 U.S.C. section 360bbb-3(b)(1), unless the authorization is terminated or revoked sooner.  Performed at [facility], [address] [place], [place] [address]   Resp Panel by RT PCR (RSV, Flu A&B, Covid) - Urine, Clean Catch     Status: None   Collection Time: 10/06/20  1:50 PM   Specimen: Urine, Clean Catch  Result Value Ref Range Status   SARS Coronavirus 2 by RT PCR NEGATIVE NEGATIVE  Final    Comment: (NOTE) SARS-CoV-2 target nucleic acids are NOT DETECTED.  The SARS-CoV-2 RNA is generally detectable in upper respiratoy specimens during the acute phase of infection. The lowest concentration of SARS-CoV-2  viral copies this assay can detect is 131 copies/mL. A negative result does not preclude SARS-Cov-2 infection and should not be used as the sole basis for treatment or other patient management decisions. A negative result may occur with  improper specimen collection/handling, submission of specimen other than nasopharyngeal swab, presence of viral mutation(s) within the areas targeted by this assay, and inadequate number of viral copies (<131 copies/mL). A negative result must be combined with clinical observations, patient history, and epidemiological information. The expected result is Negative.  Fact Sheet for Patients:  PinkCheek.be  Fact Sheet for Healthcare Providers:  GravelBags.it  This test is no t yet approved or cleared by the [place] FDA and  has been authorized for detection and/or diagnosis of SARS-CoV-2 by FDA under an Emergency Use Authorization (EUA). This EUA will remain  in effect (meaning this test can be used) for the duration of the COVID-19 declaration under Section 564(b)(1) of the Act, 21 U.S.C. section 360bbb-3(b)(1), unless the authorization is terminated or revoked sooner.     Influenza A by PCR NEGATIVE NEGATIVE Final   Influenza B by PCR NEGATIVE NEGATIVE Final    Comment: (NOTE) The Xpert Xpress SARS-CoV-2/FLU/RSV assay is intended as an aid in  the diagnosis of influenza from Nasopharyngeal swab specimens and  should not be used as a sole basis for treatment. Nasal washings and  aspirates are unacceptable for Xpert Xpress SARS-CoV-2/FLU/RSV  testing.  Fact Sheet for Patients: PinkCheek.be  Fact Sheet for Healthcare  Providers: GravelBags.it  This test is not yet approved or cleared by the [place] FDA and  has been authorized for detection and/or diagnosis of SARS-CoV-2 by  FDA under an Emergency Use Authorization (EUA). This EUA will remain  in effect (meaning this test can be used) for the duration of the  Covid-19 declaration under Section 564(b)(1) of the Act, 21  U.S.C. section 360bbb-3(b)(1), unless the authorization is  terminated or revoked.    Respiratory Syncytial Virus by PCR NEGATIVE NEGATIVE Final    Comment: (NOTE) Fact Sheet for Patients: PinkCheek.be  Fact Sheet for Healthcare Providers: GravelBags.it  This test is not yet approved or cleared by the [place] FDA and  has been authorized for detection and/or diagnosis of SARS-CoV-2 by  FDA under an Emergency Use Authorization (EUA). This EUA will remain  in effect (meaning this test can be used) for the duration of the  COVID-19 declaration under Section 564(b)(1) of the Act, 21 U.S.C.  section 360bbb-3(b)(1), unless the authorization is terminated or  revoked. Performed at [facility], [address], [place], [place] [address]   Urine Culture     Status: None   Collection Time: 10/06/20  1:50 PM   Specimen: Urine, Random  Result Value Ref Range Status   Specimen Description   Final    URINE, RANDOM Performed at [facility], [address] [place], [place] [address]    Special Requests   Final    NONE Performed at [facility], [address], [place], [place] [address]    Culture   Final    NO GROWTH Performed at [facility], [place] [address], [place], [place] [address]    Report Status 10/08/2020 FINAL  Final      Imaging Studies   DG C-Arm 1-60 Min-No Report  Result Date: 10/08/2020 Fluoroscopy was utilized by the requesting physician.  No radiographic interpretation.   DG  OR UROLOGY CYSTO IMAGE ([facility] ONLY)  Result Date:  10/08/2020 There is no interpretation for this exam.  This order is for images obtained during a surgical procedure.  Please See "Surgeries" Tab for more information regarding the procedure.     Medications   Scheduled Meds: . Chlorhexidine Gluconate Cloth  6 each Topical Daily  . feeding supplement  237 mL Oral TID BM  . fluticasone furoate-vilanterol  1 puff Inhalation Daily  . ipratropium-albuterol  3 mL Nebulization BID  . levothyroxine  150 mcg Per Tube Q0600  . metoprolol tartrate  50 mg Per Tube BID  . ondansetron (ZOFRAN) IV  4 mg Intravenous Once  . pantoprazole (PROTONIX) IV  40 mg Intravenous Q12H  . polyethylene glycol  17 g Per NG tube Daily  . sucralfate  1 g Per Tube TID WC & HS   Continuous Infusions: . cefTRIAXone (ROCEPHIN)  IV 1 g (10/08/20 1430)       LOS: 2 days    Time spent: 30 minutes with > 50% spent in coordination of care and direct patient contact    Ezekiel Slocumb, DO [facility]  10/08/2020, 4:12 PM    If 7PM-7AM, please contact night-coverage. How to contact the [facility] Attending or Consulting provider [place] or covering provider during after hours [place], for this patient?    1. Check the care team in [facility] and look for a) attending/consulting TRH provider listed and b) the [facility] team listed 2. Log into [email or web address] and use [place]'s universal password to access. If you do not have the password, please contact the hospital operator. 3. Locate the [facility] provider you are looking for under [facility] and page to a number that you can be directly reached. 4. If you still have difficulty reaching the provider, please page the [facility] (Director on Call) for the Hospitalists listed on amion for assistance.

## 2020-10-08 NOTE — Anesthesia Preprocedure Evaluation (Addendum)
Anesthesia Evaluation  Patient identified by MRN, date of birth, ID band Patient awake    Reviewed: Allergy & Precautions, NPO status , Patient's Chart, lab work & pertinent test results  History of Anesthesia Complications Negative for: history of anesthetic complications  Airway Mallampati: II       Dental  (+) Poor Dentition, Missing, Chipped, Loose   Pulmonary shortness of breath, neg sleep apnea, COPD,  COPD inhaler and oxygen dependent, Current Smoker,           Cardiovascular hypertension, Pt. on medications + CAD  (-) CHF + dysrhythmias Atrial Fibrillation (-) Valvular Problems/Murmurs     Neuro/Psych neg Seizures CVA (L sided weakness), Residual Symptoms    GI/Hepatic Neg liver ROS, neg GERD  ,  Endo/Other  neg diabetesHypothyroidism   Renal/GU Renal InsufficiencyRenal disease     Musculoskeletal   Abdominal   Peds  Hematology  (+) anemia ,   Anesthesia Other Findings   Reproductive/Obstetrics                            Anesthesia Physical Anesthesia Plan  ASA: III and emergent  Anesthesia Plan: General   Post-op Pain Management:    Induction:   PONV Risk Score and Plan: 2 and Propofol infusion and TIVA  Airway Management Planned: Nasal Cannula  Additional Equipment:   Intra-op Plan:   Post-operative Plan:   Informed Consent: I have reviewed the patients History and Physical, chart, labs and discussed the procedure including the risks, benefits and alternatives for the proposed anesthesia with the patient or authorized representative who has indicated his/her understanding and acceptance.       Plan Discussed with:   Anesthesia Plan Comments:         Anesthesia Quick Evaluation

## 2020-10-08 NOTE — Anesthesia Postprocedure Evaluation (Signed)
Anesthesia Post Note  Patient: Cathy Solis  Procedure(s) Performed: CYSTOSCOPY WITH STENT PLACEMENT (Left )  Patient location during evaluation: PACU Anesthesia Type: General Level of consciousness: awake and alert Pain management: pain level controlled Vital Signs Assessment: post-procedure vital signs reviewed and stable Respiratory status: spontaneous breathing and respiratory function stable Cardiovascular status: stable Anesthetic complications: no   No complications documented.   Last Vitals:  Vitals:   10/08/20 0715 10/08/20 0828  BP: 135/77 (!) 157/69  Pulse: 81 81  Resp: 17 (!) 22  Temp: 36.9 C 37.2 C  SpO2: 95% 93%    Last Pain:  Vitals:   10/08/20 0828  TempSrc:   PainSc: Asleep                 Ann-Marie Kluge K

## 2020-10-08 NOTE — Interval H&P Note (Signed)
UROLOGY H&P UPDATE  Agree with prior H&P dated 11/17.  Cardiac: Irregularly irregular Lungs: CTA bilaterally  Laterality: Left Procedure: Cystoscopy, left retrograde pyelogram, left ureteral stent placement   Informed consent obtained, we specifically discussed the risks of bleeding, infection, post-operative pain, stent related symptoms, possible need for additional procedures, potential need for long term stent exchanges, and possible inability to place stent necessitating nephrostomy tube.  Billey Co, MD 10/08/2020

## 2020-10-08 NOTE — Anesthesia Procedure Notes (Signed)
Procedure Name: MAC Date/Time: 10/08/2020 7:35 AM Performed by: Allean Found, CRNA Pre-anesthesia Checklist: Patient identified, Emergency Drugs available, Suction available, Patient being monitored and Timeout performed Patient Re-evaluated:Patient Re-evaluated prior to induction Oxygen Delivery Method: Nasal cannula Placement Confirmation: positive ETCO2

## 2020-10-08 NOTE — Transfer of Care (Signed)
Immediate Anesthesia Transfer of Care Note  Patient: Cathy Solis  Procedure(s) Performed: CYSTOSCOPY WITH STENT PLACEMENT (Left )  Patient Location: PACU  Anesthesia Type:MAC  Level of Consciousness: awake, alert  and oriented  Airway & Oxygen Therapy: Patient Spontanous Breathing and Patient connected to nasal cannula oxygen  Post-op Assessment: Report given to RN and Post -op Vital signs reviewed and stable  Post vital signs: Reviewed and stable  Last Vitals:  Vitals Value Taken Time  BP 157/69 10/08/20 0828  Temp 37.2 C 10/08/20 0828  Pulse 81 10/08/20 0828  Resp 23 10/08/20 0833  SpO2 93 % 10/08/20 0828  Vitals shown include unvalidated device data.  Last Pain:  Vitals:   10/08/20 0828  TempSrc:   PainSc: Asleep         Complications: No complications documented.

## 2020-10-08 NOTE — Op Note (Addendum)
Date of procedure: 10/08/20  Preoperative diagnosis:  1. Chronic left UPJ obstruction 2. UTI  Postoperative diagnosis:  1. Same  Procedure: 1. Cystoscopy, left retrograde pyelogram with intraoperative interpretation, left ureteral stent placement  Surgeon: Nickolas Madrid, MD  Anesthesia: General  Complications: None  Intraoperative findings:  1.  Purulent urine in bladder and from left kidney 2.  Uncomplicated stent placement  EBL: None  Specimens: None  Drains: Left 6 French by 26 cm Bard Optima ureteral stent, 18 French Foley  Indication: Cathy Solis is a 79 y.o. patient with chronic left UPJ obstruction and UTI who initially improved with antibiotics, but was recently readmitted with abdominal pain and persistent UTI and opted for stent placement.  After reviewing the management options for treatment, they elected to proceed with the above surgical procedure(s). We have discussed the potential benefits and risks of the procedure, side effects of the proposed treatment, the likelihood of the patient achieving the goals of the procedure, and any potential problems that might occur during the procedure or recuperation. Informed consent has been obtained.  Description of procedure:  The patient was taken to the operating room and sedation was induced. SCDs were placed for DVT prophylaxis. The patient was placed in the dorsal lithotomy position, prepped and draped in the usual sterile fashion, and preoperative antibiotics(ceftriaxone on the floor) were administered. A preoperative time-out was performed.   A 21 French rigid cystoscope was used to intubate the urethra.  Urine was frankly purulent and was irrigated free.  Purulent urine still clouded vision, but there were no obvious bladder tumors.  I was able to identify the left ureteral orifice, and a retrograde pyelogram showed a torturous proximal ureter with severe hydronephrosis.  With the aid of an access catheter I was  ultimately able to advance a Glidewire into the upper pole of the collecting system.  A 6 French by 26 cm Bard Optima ureteral stent was advanced over the wire with an excellent curl in the upper pole under fluoroscopic vision, as well as under direct vision the bladder.  There was brisk drainage of purulent material through the side ports of the stent in the bladder.  An 35 French Foley was placed with return of cloudy urine.  10 cc were placed in the balloon.  Disposition: Stable to PACU  Plan: Maintain Foley at least 48 hours until clinically improving, continue antibiotics Anticipate need for long-term stent exchanges every 6 to 12 months We will arrange clinic follow-up in 1 month for symptom check and re-discuss long term plan  Nickolas Madrid, MD

## 2020-10-09 ENCOUNTER — Inpatient Hospital Stay: Payer: Self-pay

## 2020-10-09 ENCOUNTER — Inpatient Hospital Stay: Payer: Medicare HMO

## 2020-10-09 ENCOUNTER — Encounter: Payer: Self-pay | Admitting: Urology

## 2020-10-09 DIAGNOSIS — E43 Unspecified severe protein-calorie malnutrition: Secondary | ICD-10-CM | POA: Diagnosis not present

## 2020-10-09 DIAGNOSIS — N135 Crossing vessel and stricture of ureter without hydronephrosis: Secondary | ICD-10-CM

## 2020-10-09 DIAGNOSIS — R1012 Left upper quadrant pain: Secondary | ICD-10-CM | POA: Diagnosis not present

## 2020-10-09 DIAGNOSIS — K315 Obstruction of duodenum: Secondary | ICD-10-CM | POA: Diagnosis not present

## 2020-10-09 DIAGNOSIS — Q6211 Congenital occlusion of ureteropelvic junction: Secondary | ICD-10-CM | POA: Diagnosis not present

## 2020-10-09 LAB — CBC
HCT: 34.5 % — ABNORMAL LOW (ref 36.0–46.0)
Hemoglobin: 11.5 g/dL — ABNORMAL LOW (ref 12.0–15.0)
MCH: 30.4 pg (ref 26.0–34.0)
MCHC: 33.3 g/dL (ref 30.0–36.0)
MCV: 91.3 fL (ref 80.0–100.0)
Platelets: 287 10*3/uL (ref 150–400)
RBC: 3.78 MIL/uL — ABNORMAL LOW (ref 3.87–5.11)
RDW: 13.2 % (ref 11.5–15.5)
WBC: 9.7 10*3/uL (ref 4.0–10.5)
nRBC: 0 % (ref 0.0–0.2)

## 2020-10-09 LAB — BASIC METABOLIC PANEL
Anion gap: 9 (ref 5–15)
BUN: 25 mg/dL — ABNORMAL HIGH (ref 8–23)
CO2: 26 mmol/L (ref 22–32)
Calcium: 8.4 mg/dL — ABNORMAL LOW (ref 8.9–10.3)
Chloride: 99 mmol/L (ref 98–111)
Creatinine, Ser: 1.37 mg/dL — ABNORMAL HIGH (ref 0.44–1.00)
GFR, Estimated: 39 mL/min — ABNORMAL LOW (ref >60)
Glucose, Bld: 78 mg/dL (ref 70–99)
Potassium: 3.6 mmol/L (ref 3.5–5.1)
Sodium: 134 mmol/L — ABNORMAL LOW (ref 135–145)

## 2020-10-09 LAB — MAGNESIUM: Magnesium: 1.7 mg/dL (ref 1.7–2.4)

## 2020-10-09 LAB — PHOSPHORUS: Phosphorus: 2.8 mg/dL (ref 2.5–4.6)

## 2020-10-09 MED ORDER — SODIUM CHLORIDE 0.9% FLUSH: 10.0000 mL | INTRAVENOUS | Status: DC | PRN

## 2020-10-09 MED ORDER — POLYETHYLENE GLYCOL 3350 17 G PO PACK
34.0000 g | PACK | Freq: Two times a day (BID) | ORAL | Status: DC
  Administered 2020-10-10: 09:00:00 34 g via NASOGASTRIC
  Filled 2020-10-09 (×2): qty 2

## 2020-10-09 MED ORDER — SODIUM CHLORIDE 0.9% FLUSH
10.0000 mL | Freq: Two times a day (BID) | INTRAVENOUS | Status: DC
  Administered 2020-10-10 – 2020-10-14 (×9): 10 mL
  Administered 2020-10-15: 23:00:00 20 mL
  Administered 2020-10-15 – 2020-10-21 (×12): 10 mL

## 2020-10-09 MED ORDER — POLYETHYLENE GLYCOL 3350 17 G PO PACK: 17.0000 g | PACK | Freq: Two times a day (BID) | ORAL | Status: DC

## 2020-10-09 MED ORDER — MAGNESIUM CITRATE PO SOLN
1.0000 | Freq: Once | ORAL | Status: AC
  Administered 2020-10-09: 1
  Filled 2020-10-09: qty 296

## 2020-10-09 NOTE — Progress Notes (Addendum)
Nutrition Follow-up  DOCUMENTATION CODES:   Severe malnutrition in context of chronic illness  INTERVENTION:  Plan is for initiation of TPN tomorrow per pharmacy.  Plan is to continue with full liquid diet for now to assess for tolerance of PO. Continue: -Ensure Enlive po TID, each supplement provides 350 kcal and 20 grams of protein (patient prefers vanilla) -Magic cup TID with meals, each supplement provides 290 kcal and 9 grams of protein (patient prefers orange)  Once patient is tolerating oral diet could also consider initiation of tube feeds via NGT in place if PO intake remains inadequate. If tube feeds are initiated recommend: -Initiate Osmolite 1.2 Cal at 20 mL/hr and advance by 15 mL/hr every 12 hours to goal rate of 50 mL/hr per tube -PROSource TF 45 mL once daily per tube -Provides 1480 kcal, 78 grams of protein, 984 mL H2O daily  Monitor magnesium, potassium, and phosphorus daily for at least 3 days, MD to replete as needed, as pt is at risk for refeeding syndrome given severe malnutrition, prolonged poor PO intake per family report.  NUTRITION DIAGNOSIS:   Severe Malnutrition related to chronic illness (COPD, CKD) as evidenced by severe fat depletion, severe muscle depletion.  Ongoing.  GOAL:   Patient will meet greater than or equal to 90% of their needs  Not met at this time.  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  11/18 s/p cystoscopy, left retrograde pyelogram, left ureteral stent placement  Attempted to meet with patient at bedside this morning. She was sleeping and unable to provide any history. No family members at bedside at time of RD assessment. Per review of chart patient ate bites/sips of lunch on 11/17, 0% of dinner on 11/17, and 0% of meals yesterday. She had only taken bites of her yogurt at time of RD assessment this AM.  Discussed plan of care with Dr. Arbutus Ped via secure chat. Due to concern for possible obstruction related to extrinsic compression of duodenum and inability to assess patient's tolerance of PO diet due to poor intake, plan is for initiation of TPN. Patient has still not had any flatus or BMs.  Medications reviewed and include: levothyroxine, Protonix, Miralax, Carafate 1 gram TID and QHS, ceftriaxone.  Labs reviewed: Sodium 134, BUN 25, Creatinine 1.37.  Plan is for placement of PICC today.  Discussed with pharmacist.  Diet Order:   Diet Order            Diet full liquid Room service appropriate? Yes; Fluid consistency: Thin  Diet effective now                EDUCATION NEEDS:   No education needs have been identified at this time  Skin:  Skin Assessment: Skin Integrity Issues: Skin Integrity Issues:: Other (Comment) Other: blanchable red area on bottom per RN documentation  Last BM:  Unknown  Height:   Ht Readings from Last 1 Encounters:  10/06/20 '5\' 5"'  (  1.651 m)   Weight:   Wt Readings from Last 1 Encounters:  10/06/20 51.3 kg   Ideal Body Weight:  56.8 kg  BMI:  Body mass index is 18.82 kg/m.  Estimated Nutritional Needs:   Kcal:  1400-1600  Protein:  75-85 grams  Fluid:  1.3-1.5 L/day  Jacklynn Barnacle, MS, RD, LDN Pager number available on Amion

## 2020-10-09 NOTE — Care Management Important Message (Signed)
Important Message  Patient Details  Name: Cathy Solis MRN: [id number] Date of Birth: [birth year]   Medicare Important Message Given:  Yes     [name] 10/09/2020, 11:09 AM

## 2020-10-09 NOTE — Evaluation (Signed)
Occupational Therapy Evaluation Patient Details Name: Cathy Solis MRN: [id number] DOB: September 01, 1941 Today's Date: 10/09/2020    History of Present Illness Cathy Solis is a 79 y.o. 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  Patient was recently hospitalized on 09/27/2020 and discharged on 10/05/2020.  Patient during recent admission was admitted for pyelonephritis, and discharged with antibiotics and outpatient follow-up with urologist, cardiologist and primary care provider.  She was found to have acute Pilo and AKI with culture showing E. coli and discharged on p.o. Levaquin.  Patient was also seen by cardiologist for A. fib RVR and started on Eliquis for CHADS2 score of 6.   Clinical Impression   Pt presenting with decreased IND in self-care, balance, endurance, functional mobility, and safety awareness. Prior to pt's first hospitalization earlier this month, pt was living alone, using a SPC, and Mod I in ADL. Her sister lives nearby and comes by every day and assists with IADL if needed. Pt reports no falls in previous 12 months, sister concurs. Upon OT evaluation today, pt reports no pain, is able to complete bed mobility and transfers w/o physical assistance. Sister, present in room, states that this is the best she has seen the pt moving in recent weeks. Pt irritable today, and not oriented to time (unable to state either month or year). Pt able to reach outside BOS in both sitting and standing, no LOB noted. Pt would benefit from OT while hospitalized, to improve endurance, strength, and safety awareness. Recommend pt return to SNF, as she did  following original hospitalization this month, to increase ability to engage in functional mobility tasks and return to PLOF.    Follow Up Recommendations  SNF    Equipment Recommendations       Recommendations for Other Services       Precautions / Restrictions Precautions Precautions: Fall Restrictions Weight Bearing Restrictions: No      Mobility Bed Mobility Overal bed mobility: Modified Independent Bed Mobility: Supine to Sit;Sit to Supine;Rolling Rolling: Modified independent (Device/Increase time)   Supine to sit: Modified independent (Device/Increase time) Sit to supine: Modified independent (Device/Increase time)   General bed mobility comments: Requires increased time    Transfers Overall transfer level: Modified independent Equipment used: Rolling walker (2 wheeled) Transfers: Sit to/from Stand Sit to Stand: Min guard              Balance Overall balance assessment: Needs assistance Sitting-balance support: Feet supported;Single extremity supported Sitting balance-Leahy Scale: Good Sitting balance - Comments: sits EOB, no LOB noted   Standing balance support: Bilateral upper extremity supported Standing balance-Leahy Scale: Good Standing balance comment: Able to reach outside of BOS w/o LOB                           ADL either performed or assessed with clinical judgement   ADL Overall ADL's : Needs assistance/impaired Eating/Feeding: Set up;Cueing for safety;Sitting                   Lower Body Dressing: Minimal assistance Lower Body Dressing Details (indicate cue type and reason): donning socks  Vision Patient Visual Report: No change from baseline       Perception     Praxis      Pertinent Vitals/Pain Pain Assessment: No/denies pain     Hand Dominance Right   Extremity/Trunk Assessment Upper Extremity Assessment Upper Extremity Assessment: Generalized weakness   Lower Extremity  Assessment Lower Extremity Assessment: Generalized weakness   Cervical / Trunk Assessment Cervical / Trunk Assessment: Kyphotic   Communication Communication Communication: No difficulties   Cognition Arousal/Alertness: Awake/alert Behavior During Therapy: WFL for tasks assessed/performed Overall Cognitive Status: Impaired/Different from baseline Area of Impairment: Orientation;Memory                               General Comments: oriented to self and place, not time (gives year as 38). unable to recall why she is in hospital   General Comments  pt complaining throughout session, "This is the worst place ever!"    Exercises Other Exercises Other Exercises: Pt and family educated re: d/c recs, importance of mobility, ECS, falls preventions, DME recs. Bed mobility, self-feeding, grooming, t/f, LBD.   Shoulder Instructions      Home Living   Living Arrangements: Alone Available Help at Discharge: Family;Available PRN/intermittently Type of Home: House Home Access: Level entry     Home Layout: One level     Bathroom Shower/Tub: Tub/shower unit         Home Equipment: Cane - single point;Walker - 2 wheels   Additional Comments: Pt's sister lives 1 mile away and checks in with pt each day as well as assists her when she calls.      Prior Functioning/Environment Level of Independence: Independent with assistive device(s)        Comments: Pt performs her own ADLs and IADLs, states she does her grocery shopping and errands, but when she has a "bad day (pain)" or is very fatigued she calls her sister for help        OT Problem List: Decreased activity tolerance;Decreased safety awareness;Decreased knowledge of use of DME or AE;Impaired balance (sitting and/or standing);Decreased strength      OT Treatment/Interventions: Self-care/ADL training;Therapeutic exercise;Therapeutic activities;Energy conservation;Patient/family education;DME and/or AE  instruction;Balance training    OT Goals(Current goals can be found in the care plan section) Acute Rehab OT Goals Patient Stated Goal: wants to go home soon OT Goal Formulation: With patient Time For Goal Achievement: 10/13/20 Potential to Achieve Goals: Good ADL Goals Pt Will Perform Grooming: with modified independence;standing Pt Will Transfer to Toilet: with modified independence;ambulating (using LRAD) Pt Will Perform Toileting - Clothing Manipulation and hygiene: with modified independence;sit to/from stand  OT Frequency: Min 2X/week   Barriers to D/C: Decreased caregiver support          Co-evaluation              AM-PAC OT "6 Clicks" Daily Activity     Outcome Measure Help from another person eating meals?: None Help from another person taking care of personal grooming?: A Little Help from another person toileting, which includes using toliet, bedpan, or urinal?: A Lot Help from another person bathing (including washing, rinsing, drying)?: A Lot Help from another person to put on and taking off regular upper body clothing?: A Little Help from another person to put on and taking off regular lower body clothing?: A Lot 6 Click Score: 16   End of Session Equipment Utilized During Treatment: Rolling walker  Activity Tolerance: Patient  tolerated treatment well Patient left: in bed;with call bell/phone within reach;with family/visitor present;Other (comment) (MD in room)  OT Visit Diagnosis: Unsteadiness on feet (R26.81);Muscle weakness (generalized) (M62.81);Pain                Time: 1255-1320 OT Time Calculation (min): 25 min Charges:  OT General Charges $OT Visit: 1 Visit OT Evaluation $OT Eval Low Complexity: 1 Low OT Treatments $Self Care/Home Management : 23-37 mins  Josiah Lobo, PhD, MS, OTR/L ascom [phone] 10/09/20, 1:48 PM

## 2020-10-09 NOTE — Progress Notes (Signed)
Urology Inpatient Progress Note  Subjective: No acute events overnight. WBC count down today, 9.7.  Creatinine stable, 1.37.  Urine culture resulted with no growth.  On antibiotics as below.  Intraoperative findings notable for purulent urine in the bladder and from the left kidney. Foley catheter in place draining clear, yellow urine.  Patient denies bladder and flank pain today.  Anti-infectives: Anti-infectives (From admission, onward)   Start     Dose/Rate Route Frequency Ordered Stop   10/07/20 1530  cefTRIAXone (ROCEPHIN) 1 g in sodium chloride 0.9 % 100 mL IVPB        1 g 200 mL/hr over 30 Minutes Intravenous Every 24 hours 10/07/20 1435        Current Facility-Administered Medications  Medication Dose Route Frequency Provider Last Rate Last Admin  . acetaminophen (TYLENOL) tablet 650 mg  650 mg Per Tube Q6H PRN Billey Co, MD   650 mg at 10/08/20 1736  . albuterol (VENTOLIN HFA) 108 (90 Base) MCG/ACT inhaler 2 puff  2 puff Inhalation Q6H PRN Billey Co, MD      . apixaban Arne Cleveland) tablet 2.5 mg  2.5 mg Oral BID Nicole Kindred A, DO   2.5 mg at 10/08/20 2248  . bisacodyl (DULCOLAX) EC tablet 5 mg  5 mg Oral Daily PRN Billey Co, MD      . bisacodyl (DULCOLAX) suppository 10 mg  10 mg Rectal Daily PRN Billey Co, MD      . cefTRIAXone (ROCEPHIN) 1 g in sodium chloride 0.9 % 100 mL IVPB  1 g Intravenous Q24H Billey Co, MD   Stopping Infusion hung by another clincian at 10/08/20 2242  . Chlorhexidine Gluconate Cloth 2 % PADS 6 each  6 each Topical Daily Fritzi Mandes, MD   6 each at 10/08/20 1021  . feeding supplement (ENSURE ENLIVE / ENSURE PLUS) liquid 237 mL  237 mL Oral TID BM Billey Co, MD   237 mL at 10/08/20 1950  . fluticasone furoate-vilanterol (BREO ELLIPTA) 200-25 MCG/INH 1 puff  1 puff Inhalation Daily Billey Co, MD   1 puff at 10/08/20 1432  . hydrALAZINE (APRESOLINE) injection 10 mg  10 mg Intravenous Q6H PRN Billey Co,  MD      . iohexol (OMNIPAQUE) 9 MG/ML oral solution 500 mL  500 mL Oral BID PRN Billey Co, MD   500 mL at 10/06/20 0923  . ipratropium-albuterol (DUONEB) 0.5-2.5 (3) MG/3ML nebulizer solution 3 mL  3 mL Nebulization BID Billey Co, MD   3 mL at 10/09/20 0826  . levothyroxine (SYNTHROID) tablet 150 mcg  150 mcg Per Tube [id number] Billey Co, MD   150 mcg at 10/09/20 0626  . magic mouthwash  5 mL Oral QID Nicole Kindred A, DO   5 mL at 10/08/20 1951   And  . lidocaine (XYLOCAINE) 2 % viscous mouth solution 5 mL  5 mL Mouth/Throat QID Nicole Kindred A, DO   5 mL at 10/08/20 1954  . metoprolol tartrate (LOPRESSOR) tablet 50 mg  50 mg Per Tube BID Billey Co, MD   50 mg at 10/08/20 2248  . ondansetron (ZOFRAN) injection 4 mg  4 mg Intravenous Once Billey Co, MD      . ondansetron [facility]) injection 4 mg  4 mg Intravenous Q6H PRN Billey Co, MD      . pantoprazole (PROTONIX) injection 40 mg  40 mg Intravenous Q12H Billey Co, MD  40 mg at 10/08/20 2244  . phenol (CHLORASEPTIC) mouth spray 1 spray  1 spray Mouth/Throat PRN Nicole Kindred A, DO   1 spray at 10/08/20 1737  . polyethylene glycol (MIRALAX / GLYCOLAX) packet 17 g  17 g Per NG tube Daily Billey Co, MD   17 g at 10/08/20 0928  . sucralfate (CARAFATE) 1 GM/10ML suspension 1 g  1 g Per Tube TID WC & HS Billey Co, MD   1 g at 10/08/20 2248   Objective: Vital signs in last 24 hours: Temp:  [97.9 F (36.6 C)-98.6 F (37 C)] 98.6 F (37 C) (11/19 0505) Pulse Rate:  [59-90] 66 (11/19 0505) Resp:  [16-23] 20 (11/19 0505) BP: (121-152)/(61-92) 125/74 (11/19 0505) SpO2:  [92 %-96 %] 92 % (11/19 0826)  Intake/Output from previous day: 11/18 0701 - 11/19 0700 In: 540 [P.O.:240; I.V.:300] Out: 1000 [Urine:1000] Intake/Output this shift: No intake/output data recorded.  Physical Exam Vitals and nursing note reviewed.  Constitutional:      General: She is not in acute distress.     Appearance: She is not ill-appearing, toxic-appearing or diaphoretic.  HENT:     Head: Normocephalic and atraumatic.  Pulmonary:     Effort: Pulmonary effort is normal. No respiratory distress.  Skin:    General: Skin is warm and dry.  Neurological:     Mental Status: She is alert and oriented to person, place, and time.  Psychiatric:        Mood and Affect: Mood normal.        Behavior: Behavior normal.    Lab Results:  Recent Labs    10/08/20 0531 10/09/20 0354  WBC 11.2* 9.7  HGB 12.6 11.5*  HCT 38.2 34.5*  PLT 363 287   BMET Recent Labs    10/08/20 0531 10/09/20 0354  NA 133* 134*  K 3.8 3.6  CL 98 99  CO2 25 26  GLUCOSE 70 78  BUN 24* 25*  CREATININE 1.29* 1.37*  CALCIUM 8.3* 8.4*   PT/INR Recent Labs    10/06/20 0838  LABPROT 13.8  INR 1.1   Assessment & Plan: 79 year old female with a solitary left kidney and history of chronic UPJ obstruction and hydronephrosis, admitted with recurrent versus persistent pyelonephritis, stable hydronephrosis, and duodenal compression possibly from the dilated left renal pelvis, now POD 1 from cystoscopy and left retrograde pyelogram with left ureteral stent placement with Dr. Diamantina Providence.  Patient clinically improving today.  She appears to be tolerating the stent well with no discomfort noted.   Urine culture resulted negative, however she was found to have purulent drainage from the bladder and left kidney intraoperatively.  Recommend continued empiric antibiotics given these findings.    Patient will likely require chronic indwelling ureteral stent, to be managed by Dr. Diamantina Providence on an outpatient basis.  Recommendations: -Appreciate GI input for management of duodenal compression -Continue Foley through the weekend -Continue empiric antibiotics for total of 2 weeks of therapy -Outpatient follow-up in 1 month with Dr. Diamantina Providence with renal ultrasound prior  Debroah Loop, PA-C 10/09/2020

## 2020-10-09 NOTE — Progress Notes (Signed)
Peripherally Inserted Central Catheter Placement  The IV Nurse has discussed with the patient and/or persons authorized to consent for the patient, the purpose of this procedure and the potential benefits and risks involved with this procedure.  The benefits include less needle sticks, lab draws from the catheter, and the patient may be discharged home with the catheter. Risks include, but not limited to, infection, bleeding, blood clot (thrombus formation), and puncture of an artery; nerve damage and irregular heartbeat and possibility to perform a PICC exchange if needed/ordered by physician.  Alternatives to this procedure were also discussed.  Bard Power PICC patient education guide, fact sheet on infection prevention and patient information card has been provided to patient /or left at bedside. Awaiting PCXR.   PICC Placement Documentation  PICC Double Lumen 10/09/20 PICC Right Brachial 34 cm 0 cm (Active)  Indication for Insertion or Continuance of Line Administration of hyperosmolar/irritating solutions (i.e. TPN, Vancomycin, etc.) 10/09/20 2025  Exposed Catheter (cm) 0 cm 10/09/20 2025  Site Assessment Clean;Dry;Intact 10/09/20 2025  Lumen #1 Status Blood return noted;Flushed;Saline locked 10/09/20 2025  Lumen #2 Status Blood return noted;Flushed;Saline locked 10/09/20 2025  Dressing Type Transparent;Occlusive;Securing device 10/09/20 2025  Dressing Status Clean;Dry;Intact 10/09/20 2025  Antimicrobial disc in place? Yes 10/09/20 2025  Safety Lock Not Applicable 91/47/82 9562  Line Adjustment (NICU/IV Team Only) No 10/09/20 2025  Dressing Intervention New dressing 10/09/20 2025  Dressing Change Due 10/16/20 10/09/20 2025       Cathy Solis 10/09/2020, 8:51 PM

## 2020-10-09 NOTE — Plan of Care (Signed)
  Problem: Education: Goal: Knowledge of General Education information will improve Description: Including pain rating scale, medication(s)/side effects and non-pharmacologic comfort measures Outcome: Progressing   Problem: Clinical Measurements: Goal: Ability to maintain clinical measurements within normal limits will improve Outcome: Progressing Goal: Will remain free from infection Outcome: Progressing Goal: Diagnostic test results will improve Outcome: Progressing Goal: Respiratory complications will improve Outcome: Progressing Goal: Cardiovascular complication will be avoided Outcome: Progressing   Problem: Coping: Goal: Level of anxiety will decrease Outcome: Progressing   Problem: Elimination: Goal: Will not experience complications related to urinary retention Outcome: Progressing   Problem: Pain Managment: Goal: General experience of comfort will improve Outcome: Progressing   Problem: Safety: Goal: Ability to remain free from injury will improve Outcome: Progressing   Problem: Skin Integrity: Goal: Risk for impaired skin integrity will decrease Outcome: Progressing   

## 2020-10-09 NOTE — TOC Progression Note (Signed)
Transition of Care [facility]) - Progression Note    Patient Details  Name: Cathy Solis MRN: [id number] Date of Birth: June 19, 1941  [facility]) CM/SW Contact  Shelbie Hutching, RN Phone Number: 10/09/2020, 3:26 PM  Clinical Narrative:    RNCM was able to speak with the patient's son, Cathy Solis, today.  Cathy Solis is at the bedside.  Patient and family goal is to return to [facility] for short term rehab and then get the patient home.  TOC team will cont to follow.    Expected Discharge Plan: [place] Barriers to Discharge: Continued Medical Work up  Expected Discharge Plan and Services Expected Discharge Plan: [place]   Discharge Planning Services: CM Consult Post Acute Care Choice: [place] Living arrangements for the past 2 months: Single Family Home                   DME Agency: NA       HH Arranged: NA           Social Determinants of Health (SDOH) Interventions    Readmission Risk Interventions No flowsheet data found.

## 2020-10-09 NOTE — Progress Notes (Addendum)
PROGRESS NOTE    Cathy Solis   UGQ:[id number]  DOB: 1941/08/28  PCP: Valerie Roys, DO    DOA: 10/06/2020 LOS: 3   Brief Narrative   HPI on admission, by Dr. Posey Pronto: "Cathy Solis is a 79 y.o. 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  Patient was recently hospitalized on 09/27/2020 and discharged on 10/05/2020.  Patient during recent admission was admitted for pyelonephritis, and discharged with antibiotics and outpatient follow-up with urologist, cardiologist and primary care provider.  She was found to have acute Pilo and AKI with culture showing E. coli and discharged on p.o. Levaquin.  Patient was also seen by cardiologist for A. fib RVR and started on Eliquis for CHADS2 score of 6. Prior to previous admission patient was not on home O2 was discharged on 2 L by nasal cannula.  Sister [name] was involved in patient's care and her son [name]."     Assessment & Plan   Principal Problem:   Abdominal pain Active Problems:   Hypothyroidism   Hydronephrosis   Tobacco abuse   Hypertension   Severe protein-calorie malnutrition (HCC)   Atrial fibrillation (HCC)  Abdominal Pain - due to ?duodenal compression vs SBO due to hernia vs severe constipation vs ?PUD / esophagitis.  Imaging on admission concerning for extrinsic compression of duodenum by severe renal pelvis dilation and SMA, markedly distended stomach consistent with this as well.  She also has small bowel loop in a chronic hernia on initial CT. --GI and urology consulted --Clamped NG --Will repeat an abdominal xray today --Continue liquid diet, advance per GI --IV Protonix BID  --Scheduled MiralLAX by NG tube 1-2 times daily --Start Mag citrate also  Acute kidney injury vs CKD - POA.  Hold lisinopril for now.  Monitor BMP.    Acute vs Recurrent Pyelonephritis - recently admitted for this, as above.  Resume IV Rocephin.  Follow urine culture.  Left hydronephrosis - urology consulted.  Chronic issue that appears stable on imaging from prior.  Status post Left ureteral stent placement with Dr. Diamantina Providence on 11/18.   Maintain Foley at least 48 hours until clinically improving.  Continue IV antibiotics as above. Urology anticipates long-term stent exchanges every 6-12 months.  Follow up in urology clinic in 1 month.  Constipation - scheduled MiraLAX  A-fib - rate controlled.  Continue Eliquis, metoprolol.  Telemetry monitoring.  Essential hypertension - lisinopril is held due to AKI on admission.  Continue metoprolol.  PRN hydralazine IV.  Hypothyroidism - continue levothyroxine.  TSH normal.  Severe protein calorie malnutrition - dietician following.  Oral nutrition supplements per orders.  Follow daily K, Mg, Phos due to high risk for refeeding syndrome.  Body mass index is 18.82 kg/m.  --PICC line  --Start short term TPN tomorrow given prolonged inadequate nutrition and obstruction  Generalized weakness - discharged to [facility] for short term rehab after prior admission.  OT recommends SNF.  PT evaluation pending.  Hx of tobacco abuse - nicotine patch discontinued at patient request, does not smoke.    DVT prophylaxis: apixaban (ELIQUIS) tablet 2.5 mg Start: 10/08/20 2200 SCDs Start: 10/06/20 1336.  On Eliquis   Diet:  Diet Orders (From admission, onward)  Start     Ordered   10/07/20 1200  Diet full liquid Room service appropriate? Yes; Fluid consistency: Thin  Diet effective now       Question Answer Comment  Room service appropriate? Yes   Fluid consistency: Thin      10/07/20 1159            Code Status: Full Code    Subjective  10/09/20    Pt seen with son at bedside today.  Yesterday afternoon, pt had her nurse request nicotine patch be dc'd, said she doesn't smoke.  Son confirms she does smoke 1-2 ppd, but refuses nicotine replacement products despite them helping her mood.  He says she gets irritable when off nicotine.  Pt on clear liquid diet, but says she can't tolerate "that sweet stuff".  She isn't able to tell me is liquid intake causes pain or nausea.  Son reported continued increased belching with PO intake.  No flatulence or BM's yet.   Disposition Plan & Communication   Status is: Inpatient  Remains inpatient appropriate because:Inpatient level of care appropriate due to severity of illness with NG tube in place, inadequate oral intake starting TPN, on IV antibiotics.   Dispo: The patient is from: Home              Anticipated d/c is to: Home              Anticipated d/c date is: 3 days              Patient currently is not medically stable to d/c.   Family Communication: son at bedside on rounds today, discussed plan of care at length   Consults, Procedures, Significant Events   Consultants:   Gastroenterology  Urology  Procedures:   NG tube placed  Ureteral stent placement 11/18  Antimicrobials:  Anti-infectives (From admission, onward)   Start     Dose/Rate Route Frequency Ordered Stop   10/07/20 1530  cefTRIAXone (ROCEPHIN) 1 g in sodium chloride 0.9 % 100 mL IVPB        1 g 200 mL/hr over 30 Minutes Intravenous Every 24 hours 10/07/20 1435          Objective   Vitals:   10/08/20 2357 10/09/20 0505 10/09/20 0826 10/09/20 0842  BP: 125/64 125/74  124/70  Pulse: 73 66  79  Resp: 18 20  20   Temp: 98.5 F (36.9 C) 98.6 F (37 C)  98.7 F (37.1 C)  TempSrc: Oral Oral  Oral  SpO2: 95% 96% 92% 95%  Weight:      Height:        Intake/Output Summary (Last 24 hours) at 10/09/2020 1414 Last data filed at 10/09/2020 0500 Gross per 24 hour  Intake -  Output 1000 ml  Net  -1000 ml   Filed Weights   10/06/20 0822  Weight: 51.3 kg    Physical Exam:  General exam: awake, alert, no acute distress, cachectic HEENT: NG tube in L nare clamped, hearing grossly normal  Respiratory system: clear bilaterally, no wheezing, normal respiratory effort. Cardiovascular system: normal S1/S2, RRR, no pedal edema.   Gastrointestinal system: sunken soft, non-distended, no tenderness to palpation, active bowel sounds Psychiatric: irritable mood, congruent affect, normal judgment and insight   Labs   Data Reviewed: I have personally reviewed following labs and imaging studies  CBC: Recent Labs  Lab 10/04/20 0613 10/06/20 0828 10/07/20 0342 10/08/20 0531 10/09/20 0354  WBC 7.7 14.7* 10.7* 11.2* 9.7  NEUTROABS 5.6 12.1*  --   --   --   HGB 11.5* 12.4 11.3* 12.6 11.5*  HCT 35.2* 37.2 34.3* 38.2 34.5*  MCV 92.1 92.3 92.2 92.3 91.3  PLT 343 415* 358 363 387   Basic Metabolic Panel: Recent Labs  Lab 10/04/20 0613 10/06/20 0828 10/07/20 0342 10/08/20 0531 10/09/20 0354  NA 132* 132* 132* 133* 134*  K 3.8 3.9 4.3 3.8 3.6  CL 100 97* 99 98 99  CO2 24 25 24 25 26   GLUCOSE 111* 124* 85 70 78  BUN 30* 27* 25* 24* 25*  CREATININE 1.56* 1.56* 1.47* 1.29* 1.37*  CALCIUM 8.6* 8.5* 8.4* 8.3* 8.4*  MG  --   --   --  1.7 1.7  PHOS  --   --   --  2.8 2.8   GFR: Estimated Creatinine Clearance: 26.1 mL/min (A) (by C-G formula based on SCr of 1.37 mg/dL (H)). Liver Function Tests: Recent Labs  Lab 10/04/20 0613 10/06/20 0828 10/07/20 0342  AST 13* 13* 12*  ALT 9 10 9   ALKPHOS 45 47 40  BILITOT 0.3 0.5 0.5  PROT 5.7* 6.3* 5.6*  ALBUMIN 2.1* 2.4* 2.1*   Recent Labs  Lab 10/06/20 0828  LIPASE 45   No results for input(s): AMMONIA in the last 168 hours. Coagulation Profile: Recent Labs  Lab 10/06/20 0838  INR 1.1   Cardiac Enzymes: No results for input(s): CKTOTAL, CKMB, CKMBINDEX, TROPONINI in the last 168 hours. BNP (last 3 results) No results for  input(s): PROBNP in the last 8760 hours. HbA1C: No results for input(s): HGBA1C in the last 72 hours. CBG: No results for input(s): GLUCAP in the last 168 hours. Lipid Profile: No results for input(s): CHOL, HDL, LDLCALC, TRIG, CHOLHDL, LDLDIRECT in the last 72 hours. Thyroid Function Tests: No results for input(s): TSH, T4TOTAL, FREET4, T3FREE, THYROIDAB in the last 72 hours. Anemia Panel: No results for input(s): VITAMINB12, FOLATE, FERRITIN, TIBC, IRON, RETICCTPCT in the last 72 hours. Sepsis Labs: Recent Labs  Lab 10/06/20 5643  LATICACIDVEN 1.0    Recent Results (from the past 240 hour(s))  SARS Coronavirus 2 by RT PCR (hospital order, performed in [facility] hospital lab) Nasopharyngeal Nasopharyngeal Swab     Status: None   Collection Time: 10/05/20  1:02 PM   Specimen: Nasopharyngeal Swab  Result Value Ref Range Status   SARS Coronavirus 2 NEGATIVE NEGATIVE Final    Comment: (NOTE) SARS-CoV-2 target nucleic acids are NOT DETECTED.  The SARS-CoV-2 RNA is generally detectable in upper and lower respiratory specimens during the acute phase of infection. The lowest concentration of SARS-CoV-2 viral copies this assay can detect is 250 copies / mL. A negative result does not preclude SARS-CoV-2 infection and should not be used as the sole basis for treatment or other patient management decisions.  A negative result may occur with improper specimen collection / handling, submission of specimen other than nasopharyngeal swab, presence of viral mutation(s) within the areas targeted by this assay, and inadequate number of viral copies (<250 copies / mL). A negative result must be combined with clinical observations, patient history, and epidemiological information.  Fact Sheet for Patients:   StrictlyIdeas.no  Fact Sheet for Healthcare Providers: BankingDealers.co.za  This test is not yet approved or  cleared by the [place] FDA and has been authorized for detection and/or diagnosis of SARS-CoV-2 by FDA under an Emergency Use Authorization (EUA).  This EUA will remain in effect (meaning this test can be  used) for the duration of the COVID-19 declaration under Section 564(b)(1) of the Act, 21 U.S.C. section 360bbb-3(b)(1), unless the authorization is terminated or revoked sooner.  Performed at [facility], [address] [place], [place] [address]   Resp Panel by RT PCR (RSV, Flu A&B, Covid) - Urine, Clean Catch     Status: None   Collection Time: 10/06/20  1:50 PM   Specimen: Urine, Clean Catch  Result Value Ref Range Status   SARS Coronavirus 2 by RT PCR NEGATIVE NEGATIVE Final    Comment: (NOTE) SARS-CoV-2 target nucleic acids are NOT DETECTED.  The SARS-CoV-2 RNA is generally detectable in upper respiratoy specimens during the acute phase of infection. The lowest concentration of SARS-CoV-2 viral copies this assay can detect is 131 copies/mL. A negative result does not preclude SARS-Cov-2 infection and should not be used as the sole basis for treatment or other patient management decisions. A negative result may occur with  improper specimen collection/handling, submission of specimen other than nasopharyngeal swab, presence of viral mutation(s) within the areas targeted by this assay, and inadequate number of viral copies (<131 copies/mL). A negative result must be combined with clinical observations, patient history, and epidemiological information. The expected result is Negative.  Fact Sheet for Patients:  PinkCheek.be  Fact Sheet for Healthcare Providers:  GravelBags.it  This test is no t yet approved or cleared by the [place] FDA and  has been authorized for detection and/or diagnosis of SARS-CoV-2 by FDA under an Emergency Use Authorization (EUA). This EUA will remain  in effect (meaning this test can be  used) for the duration of the COVID-19 declaration under Section 564(b)(1) of the Act, 21 U.S.C. section 360bbb-3(b)(1), unless the authorization is terminated or revoked sooner.     Influenza A by PCR NEGATIVE NEGATIVE Final   Influenza B by PCR NEGATIVE NEGATIVE Final    Comment: (NOTE) The Xpert Xpress SARS-CoV-2/FLU/RSV assay is intended as an aid in  the diagnosis of influenza from Nasopharyngeal swab specimens and  should not be used as a sole basis for treatment. Nasal washings and  aspirates are unacceptable for Xpert Xpress SARS-CoV-2/FLU/RSV  testing.  Fact Sheet for Patients: PinkCheek.be  Fact Sheet for Healthcare Providers: GravelBags.it  This test is not yet approved or cleared by the [place] FDA and  has been authorized for detection and/or diagnosis of SARS-CoV-2 by  FDA under an Emergency Use Authorization (EUA). This EUA will remain  in effect (meaning this test can be used) for the duration of the  Covid-19 declaration under Section 564(b)(1) of the Act, 21  U.S.C. section 360bbb-3(b)(1), unless the authorization is  terminated or revoked.    Respiratory Syncytial Virus by PCR NEGATIVE NEGATIVE Final    Comment: (NOTE) Fact Sheet for Patients: PinkCheek.be  Fact Sheet for Healthcare Providers: GravelBags.it  This test is not yet approved or cleared by the [place] FDA and  has been authorized for detection and/or diagnosis of SARS-CoV-2 by  FDA under an Emergency Use Authorization (EUA). This EUA will remain  in effect (meaning this test can be used) for the duration of the  COVID-19 declaration under Section 564(b)(1) of the Act, 21 U.S.C.  section 360bbb-3(b)(1), unless the authorization is terminated or  revoked. Performed at [facility], [address] [place],  [address]   Urine Culture     Status: None    Collection Time: 10/06/20  1:50 PM   Specimen: Urine, Random  Result Value Ref  Range Status   Specimen Description   Final    URINE, RANDOM Performed at [facility], [address] [place], [place] [address]    Special Requests   Final    NONE Performed at [facility], [address], [place], [place] [address]    Culture   Final    NO GROWTH Performed at [facility], [place] [address] [place], [place] [address]    Report Status 10/08/2020 FINAL  Final      Imaging Studies   DG C-Arm 1-60 Min-No Report  Result Date: 10/08/2020 Fluoroscopy was utilized by the requesting physician.  No radiographic interpretation.   DG OR UROLOGY CYSTO IMAGE ([place])  Result Date: 10/08/2020 There is no interpretation for this exam.  This order is for images obtained during a surgical procedure.  Please See "Surgeries" Tab for more information regarding the procedure.   [place] EKG SITE RITE  Result Date: 10/09/2020 If Site Rite image not attached, placement could not be confirmed due to current cardiac rhythm.    Medications   Scheduled Meds: . apixaban  2.5 mg Oral BID  . Chlorhexidine Gluconate Cloth  6 each Topical Daily  . feeding supplement  237 mL Oral TID BM  . fluticasone furoate-vilanterol  1 puff Inhalation Daily  . ipratropium-albuterol  3 mL Nebulization BID  . levothyroxine  150 mcg Per Tube Q0600  . magic mouthwash  5 mL Oral QID   And  . lidocaine  5 mL Mouth/Throat QID  . magnesium citrate  1 Bottle Per Tube Once  . metoprolol tartrate  50 mg Per Tube BID  . ondansetron (ZOFRAN) IV  4 mg Intravenous Once  . pantoprazole (PROTONIX) IV  40 mg Intravenous Q12H  . polyethylene glycol  34 g Per NG tube BID  . sucralfate  1 g Per Tube TID WC & HS   Continuous Infusions: . cefTRIAXone (ROCEPHIN)  IV Stopped (10/08/20 2242)       LOS: 3 days    Time spent: 40 minutes with > 50% spent in coordination of care and direct patient contact     Ezekiel Slocumb, DO [facility]  10/09/2020, 2:14 PM    If 7PM-7AM, please contact night-coverage. How to contact the [facility] Attending or Consulting provider [place] or covering provider during after hours Power, for this patient?    1. Check the care team in [facility] and look for a) attending/consulting [facility] provider listed and b) the [facility] team listed 2. Log into [email or web address] and use 's universal password to access. If you do not have the password, please contact the hospital operator. 3. Locate the [facility] provider you are looking for under [facility] and page to a number that you can be directly reached. 4. If you still have difficulty reaching the provider, please page the [facility] (Director on Call) for the Hospitalists listed on amion for assistance.

## 2020-10-09 NOTE — Evaluation (Signed)
Physical Therapy Evaluation Patient Details Name: CARRERA KIESEL MRN: [id number] DOB: [birth year] Today's Date: 10/09/2020   History of Present Illness  Zarayah Lanting is an 9yoF who comes to [facility] on 11/16 from [facility] (recently DC from here to there <12 hours prior). Pt brought in with ABD pain, N/V. PMH: AF on eliquis, upper GIB. Pt recently in hospital for UTI. Pt underwent Left UPJ stenting. on 11/18.  Clinical Impression  Pt admitted with above diagnosis. Pt currently with functional limitations due to the deficits listed below (see "PT Problem List"). Upon entry, pt in bed, awake but drowsy and agreeable to participate- sister at bedside. The pt is alert and oriented to self only, seems confused at times, lacking insight into problem solving, but is able to follow simple commands. History collected from family. Supervision for bed mobility, minA for transfers, poor tolerance to standing due to pain (less than 20 seconds), and only tolerating just a few small steps at bedside, unable to AMB further. Functional mobility assessment demonstrates increased effort/time requirements, poor tolerance, and need for physical assistance, whereas the patient performed these at a higher level of independence PTA. Famiyl would really like pt to regain capacity to AMB so she can return to home eventually. Pt will benefit from skilled PT intervention to increase independence and safety with basic mobility in preparation for discharge to the venue listed below.       Follow Up Recommendations SNF;Supervision for mobility/OOB;Supervision/Assistance - 24 hour    Equipment Recommendations  None recommended by PT    Recommendations for Other Services       Precautions / Restrictions Precautions Precautions: Fall Restrictions Weight Bearing Restrictions: No      Mobility  Bed Mobility Overal bed mobility: Needs Assistance Bed Mobility: Supine to Sit;Sit to Supine Rolling: Modified independent  (Device/Increase time)   Supine to sit: Supervision Sit to supine: Supervision   General bed mobility comments: weak and labored effort    Transfers Overall transfer level: Modified independent Equipment used: 1 person hand held assist Transfers: Sit to/from Stand Sit to Stand: Min assist         General transfer comment: limited control of feet and technique, motivated to rise; has back pain in standing which precludes remaining up for jsut a few seconds each time.  Ambulation/Gait Ambulation/Gait assistance: Mod assist Gait Distance (Feet): 1 Feet Assistive device: 1 person hand held assist       General Gait Details: side steppign 12inches toward [facility]; very unsteady, difficulty with trunk control, heavy BUE support required.  Stairs            Wheelchair Mobility    Modified Rankin (Stroke Patients Only)       Balance Overall balance assessment: Needs assistance Sitting-balance support: Feet supported;Single extremity supported Sitting balance-Leahy Scale: Good Sitting balance - Comments: sits EOB, no LOB noted   Standing balance support: Bilateral upper extremity supported Standing balance-Leahy Scale: Good Standing balance comment: Able to reach outside of BOS w/o LOB                             Pertinent Vitals/Pain Pain Assessment: Faces Faces Pain Scale: Hurts even more Pain Location: back pain in standing Pain Intervention(s): Limited activity within patient's tolerance;Monitored during session;Repositioned    Home Living Family/patient expects to be discharged to:: Skilled nursing facility Living Arrangements: Alone Available Help at Discharge: Family;Available PRN/intermittently Type of Home: House Home Access: Level  entry     Home Layout: One level Home Equipment: [place] - single point;Walker - 2 wheels Additional Comments: Pt's sister lives 1 mile away and checks in with pt each day as well as assists her when she calls.     Prior Function Level of Independence: Independent with assistive device(s)         Comments: Pt performs her own ADLs and IADLs, states she does her grocery shopping and errands, but when she has a "bad day (pain)" or is very fatigued she calls her sister for help     Hand Dominance   Dominant Hand: Right    Extremity/Trunk Assessment   Upper Extremity Assessment Upper Extremity Assessment: Generalized weakness;Overall [facility] for tasks assessed    Lower Extremity Assessment Lower Extremity Assessment: Generalized weakness;Overall [facility] for tasks assessed    Cervical / Trunk Assessment Cervical / Trunk Assessment: Kyphotic (severe lower throacic kyphosis)  Communication   Communication: No difficulties  Cognition Arousal/Alertness: Awake/alert Behavior During Therapy: WFL for tasks assessed/performed Overall Cognitive Status: Impaired/Different from baseline Area of Impairment: Orientation;Memory                               General Comments: somewhat confused, gives meaningless responses to some questions without any clear insight to current condition or limitations.      General Comments General comments (skin integrity, edema, etc.): pt complaining throughout session, "This is the worst place ever!"    Exercises Other Exercises Other Exercises: Pt and family educated re: d/c recs, importance of mobility, ECS, falls preventions, DME recs. Bed mobility, self-feeding, grooming, t/f, LBD.   Assessment/Plan    PT Assessment Patient needs continued PT services  PT Problem List Decreased strength;Decreased range of motion;Decreased activity tolerance;Decreased balance;Decreased mobility;Decreased knowledge of use of DME;Decreased safety awareness;Pain       PT Treatment Interventions DME instruction;Gait training;Functional mobility training;Therapeutic activities;Therapeutic exercise;Balance training;Neuromuscular re-education;Patient/family education    PT  Goals (Current goals can be found in the Care Plan section)  Acute Rehab PT Goals Patient Stated Goal: return to AMB with modI PT Goal Formulation: With family Time For Goal Achievement: 10/23/20 Potential to Achieve Goals: Fair    Frequency Min 2X/week   Barriers to discharge        Co-evaluation               AM-PAC PT "6 Clicks" Mobility  Outcome Measure Help needed turning from your back to your side while in a flat bed without using bedrails?: A Little Help needed moving from lying on your back to sitting on the side of a flat bed without using bedrails?: A Little Help needed moving to and from a bed to a chair (including a wheelchair)?: A Lot Help needed standing up from a chair using your arms (e.g., wheelchair or bedside chair)?: A Lot Help needed to walk in hospital room?: A Lot Help needed climbing 3-5 steps with a railing? : Total 6 Click Score: 13    End of Session Equipment Utilized During Treatment: Oxygen Activity Tolerance: Patient limited by pain;Patient limited by fatigue Patient left: with call bell/phone within reach;in bed;with family/visitor present;with bed alarm set;Other (comment) (heels elevated)   PT Visit Diagnosis: Muscle weakness (generalized) (M62.81);Difficulty in walking, not elsewhere classified (R26.2);Unsteadiness on feet (R26.81)    Time: 5631-4970 PT Time Calculation (min) (ACUTE ONLY): 16 min   Charges:   PT Evaluation $PT Eval High Complexity: 1 High  3:58 PM, 10/09/20 Etta Grandchild, PT, DPT Physical Therapist - [facility]  [phone] ([place])   [place] C 10/09/2020, 3:55 PM

## 2020-10-09 NOTE — Progress Notes (Signed)
Cathy Darby, MD [address]  [place]  [place], [place] [address]  Main: [phone]  Fax: [phone] Pager: [phone]   Subjective: No acute events overnight.  Patient is sitting up in bed, trying to eat crackers and slowly drinking liquids.  Her sister is bedside who reported that she is drinking more water since procedure yesterday.  Patient did not have any bowel movement yet, not passing gas.  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  Currently being treated for UTI.,  Urine output is improving.   Objective: Vital signs in last 24 hours: Vitals:   10/08/20 2357 10/09/20 0505 10/09/20 0826 10/09/20 0842  BP: 125/64 125/74  124/70  Pulse: 73 66  79  Resp: 18 20  20   Temp: 98.5 F (36.9 C) 98.6 F (37 C)  98.7 F (37.1 C)  TempSrc: Oral Oral  Oral  SpO2: 95% 96% 92% 95%  Weight:      Height:       Weight change:   Intake/Output Summary (Last 24 hours) at 10/09/2020 1356 Last data filed at 10/09/2020 0500 Gross per 24 hour  Intake 240 ml  Output 1000 ml  Net -760 ml     Exam: Heart:: Regular rate and rhythm, S1S2 present or without murmur or extra heart sounds Lungs: normal and clear to auscultation Abdomen: Soft, distended, tympanic to percussion   Lab Results: CBC Latest Ref Rng & Units 10/09/2020 10/08/2020 10/07/2020  WBC 4.0 - 10.5 K/uL 9.7 11.2(H) 10.7(H)  Hemoglobin 12.0 - 15.0 g/dL 11.5(L) 12.6 11.3(L)  Hematocrit 36 - 46 % 34.5(L) 38.2 34.3(L)  Platelets 150 - 400 K/uL 287 363 358   CMP Latest Ref Rng & Units 10/09/2020 10/08/2020 10/07/2020  Glucose 70 - 99 mg/dL 78 70 85  BUN 8 - 23 mg/dL 25(H) 24(H) 25(H)  Creatinine 0.44 - 1.00 mg/dL 1.37(H) 1.29(H) 1.47(H)  Sodium 135 - 145 mmol/L 134(L) 133(L) 132(L)  Potassium 3.5 - 5.1 mmol/L 3.6 3.8 4.3  Chloride 98 - 111 mmol/L 99 98 99  CO2 22 - 32 mmol/L 26 25 24   Calcium 8.9 - 10.3 mg/dL 8.4(L) 8.3(L)  8.4(L)  Total Protein 6.5 - 8.1 g/dL - - 5.6(L)  Total Bilirubin 0.3 - 1.2 mg/dL - - 0.5  Alkaline Phos 38 - 126 U/L - - 40  AST 15 - 41 U/L - - 12(L)  ALT 0 - 44 U/L - - 9    Micro Results: Recent Results (from the past 240 hour(s))  SARS Coronavirus 2 by RT PCR (hospital order, performed in [facility] hospital lab) Nasopharyngeal Nasopharyngeal Swab     Status: None   Collection Time: 10/05/20  1:02 PM   Specimen: Nasopharyngeal Swab  Result Value Ref Range Status   SARS Coronavirus 2 NEGATIVE NEGATIVE Final    Comment: (NOTE) SARS-CoV-2 target nucleic acids are NOT DETECTED.  The SARS-CoV-2 RNA is generally detectable in upper and lower respiratory specimens during the acute phase of infection. The lowest concentration of SARS-CoV-2 viral copies this assay can detect is 250 copies / mL. A negative result does not preclude SARS-CoV-2 infection and should not be used as the sole basis for treatment or other patient management decisions.  A negative result may occur with improper specimen collection / handling, submission of specimen other than nasopharyngeal swab, presence of viral mutation(s) within the areas targeted by this assay, and inadequate number of viral copies (<250  copies / mL). A negative result must be combined with clinical observations, patient history, and epidemiological information.  Fact Sheet for Patients:   StrictlyIdeas.no  Fact Sheet for Healthcare Providers: BankingDealers.co.za  This test is not yet approved or  cleared by the [place] FDA and has been authorized for detection and/or diagnosis of SARS-CoV-2 by FDA under an Emergency Use Authorization (EUA).  This EUA will remain in effect (meaning this test can be used) for the duration of the COVID-19 declaration under Section 564(b)(1) of the Act, 21 U.S.C. section 360bbb-3(b)(1), unless the authorization is terminated or revoked  sooner.  Performed at [facility], [address] [place], [place] [address]   Resp Panel by RT PCR (RSV, Flu A&B, Covid) - Urine, Clean Catch     Status: None   Collection Time: 10/06/20  1:50 PM   Specimen: Urine, Clean Catch  Result Value Ref Range Status   SARS Coronavirus 2 by RT PCR NEGATIVE NEGATIVE Final    Comment: (NOTE) SARS-CoV-2 target nucleic acids are NOT DETECTED.  The SARS-CoV-2 RNA is generally detectable in upper respiratoy specimens during the acute phase of infection. The lowest concentration of SARS-CoV-2 viral copies this assay can detect is 131 copies/mL. A negative result does not preclude SARS-Cov-2 infection and should not be used as the sole basis for treatment or other patient management decisions. A negative result may occur with  improper specimen collection/handling, submission of specimen other than nasopharyngeal swab, presence of viral mutation(s) within the areas targeted by this assay, and inadequate number of viral copies (<131 copies/mL). A negative result must be combined with clinical observations, patient history, and epidemiological information. The expected result is Negative.  Fact Sheet for Patients:  PinkCheek.be  Fact Sheet for Healthcare Providers:  GravelBags.it  This test is no t yet approved or cleared by the [place] FDA and  has been authorized for detection and/or diagnosis of SARS-CoV-2 by FDA under an Emergency Use Authorization (EUA). This EUA will remain  in effect (meaning this test can be used) for the duration of the COVID-19 declaration under Section 564(b)(1) of the Act, 21 U.S.C. section 360bbb-3(b)(1), unless the authorization is terminated or revoked sooner.     Influenza A by PCR NEGATIVE NEGATIVE Final   Influenza B by PCR NEGATIVE NEGATIVE Final    Comment: (NOTE) The Xpert Xpress SARS-CoV-2/FLU/RSV assay is intended as an aid in   the diagnosis of influenza from Nasopharyngeal swab specimens and  should not be used as a sole basis for treatment. Nasal washings and  aspirates are unacceptable for Xpert Xpress SARS-CoV-2/FLU/RSV  testing.  Fact Sheet for Patients: PinkCheek.be  Fact Sheet for Healthcare Providers: GravelBags.it  This test is not yet approved or cleared by the [place] FDA and  has been authorized for detection and/or diagnosis of SARS-CoV-2 by  FDA under an Emergency Use Authorization (EUA). This EUA will remain  in effect (meaning this test can be used) for the duration of the  Covid-19 declaration under Section 564(b)(1) of the Act, 21  U.S.C. section 360bbb-3(b)(1), unless the authorization is  terminated or revoked.    Respiratory Syncytial Virus by PCR NEGATIVE NEGATIVE Final    Comment: (NOTE) Fact Sheet for Patients: PinkCheek.be  Fact Sheet for Healthcare Providers: GravelBags.it  This test is not yet approved or cleared by the [place] FDA and  has been authorized for detection and/or diagnosis of SARS-CoV-2 by  FDA under an Emergency Use Authorization (EUA). This EUA  will remain  in effect (meaning this test can be used) for the duration of the  COVID-19 declaration under Section 564(b)(1) of the Act, 21 U.S.C.  section 360bbb-3(b)(1), unless the authorization is terminated or  revoked. Performed at [facility], [address] [place], [place] [address]   Urine Culture     Status: None   Collection Time: 10/06/20  1:50 PM   Specimen: Urine, Random  Result Value Ref Range Status   Specimen Description   Final    URINE, RANDOM Performed at [facility], [address], [place], [place] [address]    Special Requests   Final    NONE Performed at [facility], [address] [place], [place] [address]    Culture    Final    NO GROWTH Performed at [facility], [place] [address], [place], [place] [address]    Report Status 10/08/2020 FINAL  Final   Studies/Results: DG C-Arm 1-60 Min-No Report  Result Date: 10/08/2020 Fluoroscopy was utilized by the requesting physician.  No radiographic interpretation.   DG OR UROLOGY CYSTO IMAGE ([place])  Result Date: 10/08/2020 There is no interpretation for this exam.  This order is for images obtained during a surgical procedure.  Please See "Surgeries" Tab for more information regarding the procedure.   [place] EKG SITE RITE  Result Date: 10/09/2020 If Site Rite image not attached, placement could not be confirmed due to current cardiac rhythm.  Medications:  I have reviewed the patient's current medications. Prior to Admission:  Medications Prior to Admission  Medication Sig Dispense Refill Last Dose  . apixaban (ELIQUIS) 2.5 MG TABS tablet Take 1 tablet (2.5 mg total) by mouth 2 (two) times daily.   10/05/2020 at 2000  . levothyroxine (SYNTHROID) 150 MCG tablet Take 1 tablet (150 mcg total) by mouth daily. 90 tablet 3 10/06/2020 at 0630  . lisinopril (ZESTRIL) 10 MG tablet Take 1 tablet (10 mg total) by mouth daily. TAKE 1 TABLET BY MOUTH EVERY DAY 90 tablet 1 10/05/2020 at 0800  . metoprolol tartrate (LOPRESSOR) 50 MG tablet Take 1 tablet (50 mg total) by mouth 2 (two) times daily.   10/05/2020 at 2000  . nicotine (NICODERM CQ - DOSED IN MG/24 HOURS) 21 mg/24hr patch Place 1 patch (21 mg total) onto the skin daily. 28 patch  10/05/2020 at Unknown time  . acetaminophen (TYLENOL) 325 MG tablet Take 2 tablets (650 mg total) by mouth every 6 (six) hours as needed for mild pain (or Fever >/= 101).   unknown at prn  . albuterol (VENTOLIN HFA) 108 (90 Base) MCG/ACT inhaler Inhale 2 puffs into the lungs every 6 (six) hours as needed for wheezing or shortness of breath. 18 g 6 unknown at prn  . feeding supplement (ENSURE ENLIVE / ENSURE PLUS) LIQD Take 237 mLs  by mouth 2 (two) times daily between meals.     . fluticasone furoate-vilanterol (BREO ELLIPTA) 200-25 MCG/INH AEPB Inhale 1 puff into the lungs daily.     [name] ipratropium-albuterol (DUONEB) 0.5-2.5 (3) MG/3ML SOLN Take 3 mLs by nebulization 2 (two) times daily for 4 days. DuoNeb nebulizer treatments twice a day for 4 days followed by as needed for severe shortness of breath (Patient taking differently: Take 3 mLs by nebulization 2 (two) times daily. ) 360 mL 1    Scheduled: . apixaban  2.5 mg Oral BID  . Chlorhexidine Gluconate Cloth  6 each Topical Daily  . feeding supplement  237 mL Oral TID BM  . fluticasone furoate-vilanterol  1 puff Inhalation Daily  . ipratropium-albuterol  3 mL Nebulization BID  . levothyroxine  150 mcg Per Tube Q0600  . magic mouthwash  5 mL Oral QID   And  . lidocaine  5 mL Mouth/Throat QID  . magnesium citrate  1 Bottle Per Tube Once  . metoprolol tartrate  50 mg Per Tube BID  . ondansetron (ZOFRAN) IV  4 mg Intravenous Once  . pantoprazole (PROTONIX) IV  40 mg Intravenous Q12H  . polyethylene glycol  34 g Per NG tube BID  . sucralfate  1 g Per Tube TID WC & HS   Continuous: . cefTRIAXone (ROCEPHIN)  IV Stopped (10/08/20 2242)   WUJ:[id number], albuterol, bisacodyl, bisacodyl, hydrALAZINE, iohexol, ondansetron (ZOFRAN) IV, phenol Anti-infectives (From admission, onward)   Start     Dose/Rate Route Frequency Ordered Stop   10/07/20 1530  cefTRIAXone (ROCEPHIN) 1 g in sodium chloride 0.9 % 100 mL IVPB        1 g 200 mL/hr over 30 Minutes Intravenous Every 24 hours 10/07/20 1435       Scheduled Meds: . apixaban  2.5 mg Oral BID  . Chlorhexidine Gluconate Cloth  6 each Topical Daily  . feeding supplement  237 mL Oral TID BM  . fluticasone furoate-vilanterol  1 puff Inhalation Daily  . ipratropium-albuterol  3 mL Nebulization BID  . levothyroxine  150 mcg Per Tube Q0600  . magic mouthwash  5 mL Oral QID   And  . lidocaine  5 mL Mouth/Throat QID  .  magnesium citrate  1 Bottle Per Tube Once  . metoprolol tartrate  50 mg Per Tube BID  . ondansetron (ZOFRAN) IV  4 mg Intravenous Once  . pantoprazole (PROTONIX) IV  40 mg Intravenous Q12H  . polyethylene glycol  34 g Per NG tube BID  . sucralfate  1 g Per Tube TID WC & HS   Continuous Infusions: . 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 coli UTI presented with abdominal distention secondary to extrinsic duodenal compression from markedly dilated right renal pelvis  Plan: Duodenal compression:Extrinsic secondary to markedly dilated right renal pelvis and left chronic UPJ stenosis s/p left ureteral stent placement.  Currently on clear liquids, tolerating well, can slowly advance the diet as she tolerates.  Discontinue NG tube to suction Continue Protonix 40 mg IV twice daily No further episodes of hematemesis or coffee-ground emesis, hemoglobin is stable Do not recommend endoscopic evaluation at this time,  Recommend repeat imaging to reassess duodenal compression if patient is not able to tolerate beyond liquid diet.  At this time, if there is persistent duodenal compression, upper endoscopy would be indicated  Appreciate urology input  Chronic constipation: Increase  MiraLAX to 34 g twice daily, administer only via NG tube as patient may not be compliant to take it orally Recommend magnesium citrate via NG tube only  Dr.  Alice Reichert will cover for the weekend   LOS: 3 days   Cathy Solis 10/09/2020, 1:56 PM

## 2020-10-10 ENCOUNTER — Inpatient Hospital Stay: Payer: Medicare HMO

## 2020-10-10 DIAGNOSIS — R1012 Left upper quadrant pain: Secondary | ICD-10-CM | POA: Diagnosis not present

## 2020-10-10 DIAGNOSIS — Q6211 Congenital occlusion of ureteropelvic junction: Secondary | ICD-10-CM | POA: Diagnosis not present

## 2020-10-10 DIAGNOSIS — E43 Unspecified severe protein-calorie malnutrition: Secondary | ICD-10-CM | POA: Diagnosis not present

## 2020-10-10 DIAGNOSIS — K315 Obstruction of duodenum: Secondary | ICD-10-CM | POA: Diagnosis not present

## 2020-10-10 DIAGNOSIS — E039 Hypothyroidism, unspecified: Secondary | ICD-10-CM | POA: Diagnosis not present

## 2020-10-10 LAB — BASIC METABOLIC PANEL
Anion gap: 8 (ref 5–15)
BUN: 25 mg/dL — ABNORMAL HIGH (ref 8–23)
CO2: 27 mmol/L (ref 22–32)
Calcium: 8.3 mg/dL — ABNORMAL LOW (ref 8.9–10.3)
Chloride: 98 mmol/L (ref 98–111)
Creatinine, Ser: 1.36 mg/dL — ABNORMAL HIGH (ref 0.44–1.00)
GFR, Estimated: 39 mL/min — ABNORMAL LOW (ref >60)
Glucose, Bld: 100 mg/dL — ABNORMAL HIGH (ref 70–99)
Potassium: 3.7 mmol/L (ref 3.5–5.1)
Sodium: 133 mmol/L — ABNORMAL LOW (ref 135–145)

## 2020-10-10 LAB — HEMOGLOBIN A1C
Hgb A1c MFr Bld: 5.9 % — ABNORMAL HIGH (ref 4.8–5.6)
Mean Plasma Glucose: 122.63 mg/dL

## 2020-10-10 LAB — MAGNESIUM: Magnesium: 2.1 mg/dL (ref 1.7–2.4)

## 2020-10-10 LAB — GLUCOSE, CAPILLARY: Glucose-Capillary: 123 mg/dL — ABNORMAL HIGH (ref 70–99)

## 2020-10-10 LAB — PHOSPHORUS: Phosphorus: 2.2 mg/dL — ABNORMAL LOW (ref 2.5–4.6)

## 2020-10-10 MED ORDER — TRAVASOL 10 % IV SOLN
INTRAVENOUS | Status: AC
  Filled 2020-10-10: qty 277.2

## 2020-10-10 MED ORDER — FLEET ENEMA 7-19 GM/118ML RE ENEM: 1.0000 | ENEMA | Freq: Two times a day (BID) | RECTAL | Status: DC

## 2020-10-10 MED ORDER — INSULIN ASPART 100 UNIT/ML ~~LOC~~ SOLN
0.0000 [IU] | Freq: Three times a day (TID) | SUBCUTANEOUS | Status: DC
  Administered 2020-10-11: 1 [IU] via SUBCUTANEOUS

## 2020-10-10 MED ORDER — SODIUM CHLORIDE 0.9 % IV SOLN: INTRAVENOUS | Status: AC

## 2020-10-10 NOTE — Progress Notes (Signed)
PROGRESS NOTE    Cathy Solis   QVZ:[id number]  DOB: 12/12/1940  PCP: Valerie Roys, DO    DOA: 10/06/2020 LOS: 4   Brief Narrative   HPI on admission, by Dr. Posey Pronto: "CREEDENCE HEISS is a 79 y.o. 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  Patient was recently hospitalized on 09/27/2020 and discharged on 10/05/2020.  Patient during recent admission was admitted for pyelonephritis, and discharged with antibiotics and outpatient follow-up with urologist, cardiologist and primary care provider.  She was found to have acute Pilo and AKI with culture showing E. coli and discharged on p.o. Levaquin.  Patient was also seen by cardiologist for A. fib RVR and started on Eliquis for CHADS2 score of 6. Prior to previous admission patient was not on home O2 was discharged on 2 L by nasal cannula.  Sister [name] was involved in patient's care and her son [name]."     Assessment & Plan   Principal Problem:   Abdominal pain Active Problems:   Hypothyroidism   Hydronephrosis   Tobacco abuse   Hypertension   Severe protein-calorie malnutrition (HCC)   Atrial fibrillation (HCC)  Abdominal Pain - due to ?duodenal compression vs SBO due to hernia vs severe constipation vs ?PUD / esophagitis.  Imaging on admission concerning for extrinsic compression of duodenum by severe renal pelvis dilation and SMA, markedly distended stomach consistent with this as well.  She also has small bowel loop in a chronic hernia on initial CT. --Consults: GI, urology, general surgery --Clamped NG  --Continue liquid diet, advance per GI --IV Protonix BID --Scheduled MiralLAX by  NG tube 1-2 times daily, started Mag citrate by NG on 11/19 as well (report of small BM)  Acute kidney injury vs CKD - POA.  Hold lisinopril for now.  Monitor BMP.    Acute vs Recurrent Pyelonephritis - recently admitted for this, as above.  Continue IV Rocephin.  Urine culture negative this admission.  Urology recommends 2 weeks of antibiotics.    Left hydronephrosis - urology consulted.  Chronic issue that appears stable on imaging from prior.  Status post Left ureteral stent placement with Dr. Diamantina Providence on 11/18.   Maintain Foley this weekend / until clinically improving.  Continue IV antibiotics as above. Urology anticipates long-term stent exchanges every 6-12 months.  Follow up in urology clinic in 1 month.  Constipation - scheduled MiraLAX, Mag citrate by NG per GI, try enemas per surgery  A-fib - rate controlled.  Continue Eliquis, metoprolol.  Telemetry monitoring.  Essential hypertension - lisinopril is held due to AKI on admission.  Continue metoprolol.  PRN hydralazine IV.  Hypothyroidism - continue levothyroxine.  TSH normal.  Severe protein calorie malnutrition - dietician following.  Oral nutrition supplements per orders.  Follow daily K, Mg, Phos due to high risk for refeeding syndrome.  Body mass index is 18.82 kg/m.  --PICC line  --Start short term TPN tomorrow given prolonged inadequate nutrition and obstruction  Generalized weakness - discharged to [facility] for short term rehab after prior admission.  OT recommends SNF.  PT evaluation pending.  Hx of tobacco abuse - nicotine patch discontinued at patient request.  She does smoke 1-2 ppd, but declines  NRT.   DVT prophylaxis: apixaban (ELIQUIS) tablet 2.5 mg Start: 10/08/20 2200 SCDs Start: 10/06/20 1336.  On Eliquis   Diet:  Diet Orders (From admission, onward)    Start     Ordered   10/10/20 1026  Diet clear liquid Room service appropriate? Yes; Fluid consistency: Thin  Diet effective now       Question  Answer Comment  Room service appropriate? Yes   Fluid consistency: Thin      10/10/20 1026            Code Status: Full Code    Subjective 10/10/20    Pt seen with son at bedside today. Pt states feeling miserable, started feeling worse again last night and this morning.  Has had increased abdominal distention and recurrent of nausea vomiting after eating some sherbet.  Pt has no other acute complaints at this time.    Disposition Plan & Communication   Status is: Inpatient  Remains inpatient appropriate because:Inpatient level of care appropriate due to severity of illness with NG tube in place, inadequate oral intake starting TPN, on IV antibiotics.     Dispo: The patient is from: Home              Anticipated d/c is to: Home              Anticipated d/c date is: 3 days              Patient currently is not medically stable to d/c.   Family Communication: son at bedside on rounds today.     Consults, Procedures, Significant Events   Consultants:   Gastroenterology  Urology  General surgery  Procedures:   NG tube placed  Ureteral stent placement 11/18  Antimicrobials:  Anti-infectives (From admission, onward)   Start     Dose/Rate Route Frequency Ordered Stop   10/07/20 1530  cefTRIAXone (ROCEPHIN) 1 g in sodium chloride 0.9 % 100 mL IVPB        1 g 200 mL/hr over 30 Minutes Intravenous Every 24 hours 10/07/20 1435          Objective   Vitals:   10/09/20 2351 10/10/20 0414 10/10/20 0838 10/10/20 1210  BP: 120/60 (!) 157/68 137/89 (!) 147/79  Pulse: 70 81 94 80  Resp: 18 17 16 20   Temp: 98 F (36.7 C) 97.7 F (36.5 C) (!) 96.5 F (35.8 C) 97.7 F (36.5 C)  TempSrc: Oral  Axillary Axillary  SpO2: 90% 92% 92% 98%  Weight:      Height:        Intake/Output Summary (Last 24 hours) at 10/10/2020 1536 Last data filed at 10/10/2020 1100 Gross per 24 hour  Intake 100 ml  Output 1700 ml  Net -1600 ml   Filed Weights   10/06/20 0822  Weight:  51.3 kg    Physical Exam:  General exam: awake, alert, no acute distress, cachectic HEENT: NG tube in L nare, moist mucus membranes Respiratory system: clear bilaterally, no wheezing, normal respiratory effort. Cardiovascular system: normal S1/S2, RRR, no pedal edema.   Gastrointestinal system: non-tender but is distended, active bowel sounds   Labs   Data Reviewed: I have personally reviewed following labs and imaging studies  CBC: Recent Labs  Lab 10/04/20 0613 10/06/20 0828 10/07/20 0342 10/08/20 0531 10/09/20 0354  WBC 7.7 14.7* 10.7* 11.2* 9.7  NEUTROABS 5.6 12.1*  --   --   --   HGB 11.5* 12.4 11.3* 12.6 11.5*  HCT  35.2* 37.2 34.3* 38.2 34.5*  MCV 92.1 92.3 92.2 92.3 91.3  PLT 343 415* 358 363 161   Basic Metabolic Panel: Recent Labs  Lab 10/06/20 0828 10/07/20 0342 10/08/20 0531 10/09/20 0354 10/10/20 0423  NA 132* 132* 133* 134* 133*  K 3.9 4.3 3.8 3.6 3.7  CL 97* 99 98 99 98  CO2 25 24 25 26 27   GLUCOSE 124* 85 70 78 100*  BUN 27* 25* 24* 25* 25*  CREATININE 1.56* 1.47* 1.29* 1.37* 1.36*  CALCIUM 8.5* 8.4* 8.3* 8.4* 8.3*  MG  --   --  1.7 1.7 2.1  PHOS  --   --  2.8 2.8 2.2*   GFR: Estimated Creatinine Clearance: 26.3 mL/min (A) (by C-G formula based on SCr of 1.36 mg/dL (H)). Liver Function Tests: Recent Labs  Lab 10/04/20 0613 10/06/20 0828 10/07/20 0342  AST 13* 13* 12*  ALT 9 10 9   ALKPHOS 45 47 40  BILITOT 0.3 0.5 0.5  PROT 5.7* 6.3* 5.6*  ALBUMIN 2.1* 2.4* 2.1*   Recent Labs  Lab 10/06/20 0828  LIPASE 45   No results for input(s): AMMONIA in the last 168 hours. Coagulation Profile: Recent Labs  Lab 10/06/20 0838  INR 1.1   Cardiac Enzymes: No results for input(s): CKTOTAL, CKMB, CKMBINDEX, TROPONINI in the last 168 hours. BNP (last 3 results) No results for input(s): PROBNP in the last 8760 hours. HbA1C: No results for input(s): HGBA1C in the last 72 hours. CBG: No results for input(s): GLUCAP in the last 168  hours. Lipid Profile: No results for input(s): CHOL, HDL, LDLCALC, TRIG, CHOLHDL, LDLDIRECT in the last 72 hours. Thyroid Function Tests: No results for input(s): TSH, T4TOTAL, FREET4, T3FREE, THYROIDAB in the last 72 hours. Anemia Panel: No results for input(s): VITAMINB12, FOLATE, FERRITIN, TIBC, IRON, RETICCTPCT in the last 72 hours. Sepsis Labs: Recent Labs  Lab 10/06/20 0960  LATICACIDVEN 1.0    Recent Results (from the past 240 hour(s))  SARS Coronavirus 2 by RT PCR (hospital order, performed in [facility] hospital lab) Nasopharyngeal Nasopharyngeal Swab     Status: None   Collection Time: 10/05/20  1:02 PM   Specimen: Nasopharyngeal Swab  Result Value Ref Range Status   SARS Coronavirus 2 NEGATIVE NEGATIVE Final    Comment: (NOTE) SARS-CoV-2 target nucleic acids are NOT DETECTED.  The SARS-CoV-2 RNA is generally detectable in upper and lower respiratory specimens during the acute phase of infection. The lowest concentration of SARS-CoV-2 viral copies this assay can detect is 250 copies / mL. A negative result does not preclude SARS-CoV-2 infection and should not be used as the sole basis for treatment or other patient management decisions.  A negative result may occur with improper specimen collection / handling, submission of specimen other than nasopharyngeal swab, presence of viral mutation(s) within the areas targeted by this assay, and inadequate number of viral copies (<250 copies / mL). A negative result must be combined with clinical observations, patient history, and epidemiological information.  Fact Sheet for Patients:   StrictlyIdeas.no  Fact Sheet for Healthcare Providers: BankingDealers.co.za  This test is not yet approved or  cleared by the [place] FDA and has been authorized for detection and/or diagnosis of SARS-CoV-2 by FDA under an Emergency Use Authorization (EUA).  This EUA will remain in  effect (meaning this test can be used) for the duration of the COVID-19 declaration under Section 564(b)(1) of the Act, 21 U.S.C. section 360bbb-3(b)(1), unless the authorization is terminated or revoked  sooner.  Performed at [facility], [address] [place], [place] [address]   Resp Panel by RT PCR (RSV, Flu A&B, Covid) - Urine, Clean Catch     Status: None   Collection Time: 10/06/20  1:50 PM   Specimen: Urine, Clean Catch  Result Value Ref Range Status   SARS Coronavirus 2 by RT PCR NEGATIVE NEGATIVE Final    Comment: (NOTE) SARS-CoV-2 target nucleic acids are NOT DETECTED.  The SARS-CoV-2 RNA is generally detectable in upper respiratoy specimens during the acute phase of infection. The lowest concentration of SARS-CoV-2 viral copies this assay can detect is 131 copies/mL. A negative result does not preclude SARS-Cov-2 infection and should not be used as the sole basis for treatment or other patient management decisions. A negative result may occur with  improper specimen collection/handling, submission of specimen other than nasopharyngeal swab, presence of viral mutation(s) within the areas targeted by this assay, and inadequate number of viral copies (<131 copies/mL). A negative result must be combined with clinical observations, patient history, and epidemiological information. The expected result is Negative.  Fact Sheet for Patients:  PinkCheek.be  Fact Sheet for Healthcare Providers:  GravelBags.it  This test is no t yet approved or cleared by the [place] FDA and  has been authorized for detection and/or diagnosis of SARS-CoV-2 by FDA under an Emergency Use Authorization (EUA). This EUA will remain  in effect (meaning this test can be used) for the duration of the COVID-19 declaration under Section 564(b)(1) of the Act, 21 U.S.C. section 360bbb-3(b)(1), unless the authorization is  terminated or revoked sooner.     Influenza A by PCR NEGATIVE NEGATIVE Final   Influenza B by PCR NEGATIVE NEGATIVE Final    Comment: (NOTE) The Xpert Xpress SARS-CoV-2/FLU/RSV assay is intended as an aid in  the diagnosis of influenza from Nasopharyngeal swab specimens and  should not be used as a sole basis for treatment. Nasal washings and  aspirates are unacceptable for Xpert Xpress SARS-CoV-2/FLU/RSV  testing.  Fact Sheet for Patients: PinkCheek.be  Fact Sheet for Healthcare Providers: GravelBags.it  This test is not yet approved or cleared by the [place] FDA and  has been authorized for detection and/or diagnosis of SARS-CoV-2 by  FDA under an Emergency Use Authorization (EUA). This EUA will remain  in effect (meaning this test can be used) for the duration of the  Covid-19 declaration under Section 564(b)(1) of the Act, 21  U.S.C. section 360bbb-3(b)(1), unless the authorization is  terminated or revoked.    Respiratory Syncytial Virus by PCR NEGATIVE NEGATIVE Final    Comment: (NOTE) Fact Sheet for Patients: PinkCheek.be  Fact Sheet for Healthcare Providers: GravelBags.it  This test is not yet approved or cleared by the [place] FDA and  has been authorized for detection and/or diagnosis of SARS-CoV-2 by  FDA under an Emergency Use Authorization (EUA). This EUA will remain  in effect (meaning this test can be used) for the duration of the  COVID-19 declaration under Section 564(b)(1) of the Act, 21 U.S.C.  section 360bbb-3(b)(1), unless the authorization is terminated or  revoked. Performed at [facility], [address] [place], [place] [address]   Urine Culture     Status: None   Collection Time: 10/06/20  1:50 PM   Specimen: Urine, Random  Result Value Ref Range Status   Specimen Description   Final    URINE,  RANDOM Performed at [facility], [place], [place]  [address]    Special Requests   Final    NONE Performed at [facility], [address] [place], [place] [address]    Culture   Final    NO GROWTH Performed at [facility], [address], [place], [place] [address]    Report Status 10/08/2020 FINAL  Final      Imaging Studies   DG Chest Port 1 View  Result Date: 10/09/2020 CLINICAL DATA:  79 year old female status post PICC placement. EXAM: PORTABLE CHEST 1 VIEW COMPARISON:  Earlier radiograph dated 10/09/2020. FINDINGS: Interval advancement of the right-sided PICC with tip at the cavoatrial junction. Enteric tube extends below the diaphragm with tip beyond the inferior margin of the image. Similar appearance of right pleural effusion and right lung base atelectasis or infiltrate. No pneumothorax. Stable cardiomediastinal silhouette. No acute osseous pathology. IMPRESSION: Interval advancement of the right-sided PICC with tip at the cavoatrial junction. No other interval change. Electronically Signed   By: Anner Crete M.D.   On: 10/09/2020 23:09   DG Chest Port 1 View  Result Date: 10/09/2020 CLINICAL DATA:  Right PICC line placement. EXAM: PORTABLE CHEST 1 VIEW COMPARISON:  September 29, 2020 FINDINGS: The right PICC line likely terminates just below the brachiocephalic confluence, 8.8 cm above the caval atrial junction. A right-sided pleural effusion with underlying opacity has increased in the interval. No other changes. An NG tube terminates in the left upper quadrant of the abdomen. IMPRESSION: 1. The new right PICC line distal tip is likely just inferior to the brachiocephalic confluence, 8.8 cm above the caval atrial junction. 2. Increasing effusion and underlying opacity in the right base. 3. No other changes. Electronically Signed   By: Dorise Bullion III M.D   On: 10/09/2020 21:26   DG Abd Portable 1V  Result Date:  10/09/2020 CLINICAL DATA:  Small bowel obstruction EXAM: PORTABLE ABDOMEN - 1 VIEW COMPARISON:  October 06, 2020 FINDINGS: The NG tube side port and distal tip are within the stomach. Small right pleural effusion with underlying atelectasis. No obvious free air or portal venous gas. A left ureteral stent is identified, apparently new in the interval. Scoliosis in lumbar spine. Vascular calcifications. Air-filled loops of large and small bowel are identified. No obvious bowel obstruction on this study. IMPRESSION: 1. The NG tube is in good position. There is a new left ureteral stent identified. 2. No obvious bowel obstruction on this study. 3. Small right pleural effusion with underlying atelectasis. Electronically Signed   By: Dorise Bullion III M.D   On: 10/09/2020 14:24   DG Abd Portable 2V  Result Date: 10/10/2020 CLINICAL DATA:  History of duodenal obstruction. Left ureteral stent placed. EXAM: PORTABLE ABDOMEN - 2 VIEW COMPARISON:  10/09/2020 FINDINGS: Nasogastric tube has tip and side-port over the region of the stomach in the left upper quadrant. Double-J left-sided internal ureteral stent appears in adequate position and unchanged. Bowel gas pattern demonstrates air throughout the colon with mild fecal retention over the rectum. No evidence of free peritoneal air. No definite dilated small bowel loops. Remainder of the exam is unchanged. IMPRESSION: 1. Nonobstructive bowel gas pattern with mild fecal retention over the rectum. 2. Nasogastric tube and left-sided double-J internal ureteral stent unchanged. Electronically Signed   By: Marin Olp M.D.   On: 10/10/2020 11:22   [place] EKG SITE RITE  Result Date: 10/09/2020 If Site Rite image not attached, placement could not be confirmed due to current cardiac rhythm.  Medications   Scheduled Meds: . apixaban  2.5 mg Oral BID  . Chlorhexidine Gluconate Cloth  6 each Topical Daily  . feeding supplement  237 mL Oral TID BM  . fluticasone  furoate-vilanterol  1 puff Inhalation Daily  . insulin aspart  0-9 Units Subcutaneous Q8H  . ipratropium-albuterol  3 mL Nebulization BID  . levothyroxine  150 mcg Per Tube Q0600  . magic mouthwash  5 mL Oral QID   And  . lidocaine  5 mL Mouth/Throat QID  . metoprolol tartrate  50 mg Per Tube BID  . ondansetron (ZOFRAN) IV  4 mg Intravenous Once  . pantoprazole (PROTONIX) IV  40 mg Intravenous Q12H  . polyethylene glycol  34 g Per NG tube BID  . sodium chloride flush  10-40 mL Intracatheter Q12H  . sodium phosphate  1 enema Rectal q12n4p  . sucralfate  1 g Per Tube TID WC & HS   Continuous Infusions: . sodium chloride 75 mL/hr at 10/10/20 1255  . cefTRIAXone (ROCEPHIN)  IV 1 g (10/09/20 2249)  . TPN ADULT (ION)         LOS: 4 days    Time spent: 35 minutes with > 50% spent in coordination of care and direct patient contact    Ezekiel Slocumb, DO [facility]  10/10/2020, 3:36 PM    If 7PM-7AM, please contact night-coverage. How to contact the [facility] Attending or Consulting provider [place] or covering provider during after hours [place], for this patient?    1. Check the care team in [facility] and look for a) attending/consulting TR[facility] provider listed and b) the [facility] team listed 2. Log into [email or web address] and use [place]'s universal password to access. If you do not have the password, please contact the hospital operator. 3. Locate the [facility] provider you are looking for under [facility] and page to a number that you can be directly reached. 4. If you still have difficulty reaching the provider, please page the [facility] (Director on Call) for the Hospitalists listed on amion for assistance.

## 2020-10-10 NOTE — Progress Notes (Signed)
GI Inpatient Follow-up Note  Subjective:  Patient seen in follow-up for extrinsic duodenal compression 2/2 markedly dilated right renal pelvis s/p left ureteral stent placement by Dr. Diamantina Providence 11/18. Myself and Dr. Alice Reichert are covering this weekend for Dr. Marius Ditch. Patient's son, [name], is present bedside. He reports his mother is doing much worse this morning. He reports last night she had one episode of clear and orange-tinted emesis (likely orange because of eating orange sherbet). Patient has been very nauseous. She reports "I hurt all over." She did have bowel movement yesterday evening and also had formed stool this morning in bed pan which was non-melanotic and no gross bleeding. She had PICC line placed last night and plan is to start TPN this morning.   Scheduled Inpatient Medications:  . apixaban  2.5 mg Oral BID  . Chlorhexidine Gluconate Cloth  6 each Topical Daily  . feeding supplement  237 mL Oral TID BM  . fluticasone furoate-vilanterol  1 puff Inhalation Daily  . insulin aspart  0-9 Units Subcutaneous Q8H  . ipratropium-albuterol  3 mL Nebulization BID  . levothyroxine  150 mcg Per Tube Q0600  . magic mouthwash  5 mL Oral QID   And  . lidocaine  5 mL Mouth/Throat QID  . metoprolol tartrate  50 mg Per Tube BID  . ondansetron (ZOFRAN) IV  4 mg Intravenous Once  . pantoprazole (PROTONIX) IV  40 mg Intravenous Q12H  . polyethylene glycol  34 g Per NG tube BID  . sodium chloride flush  10-40 mL Intracatheter Q12H  . sodium phosphate  1 enema Rectal q12n4p  . sucralfate  1 g Per Tube TID WC & HS    Continuous Inpatient Infusions:   . sodium chloride    . cefTRIAXone (ROCEPHIN)  IV 1 g (10/09/20 2249)  . TPN ADULT (ION)      PRN Inpatient Medications:  acetaminophen, albuterol, bisacodyl, bisacodyl, hydrALAZINE, iohexol, ondansetron (ZOFRAN) IV, phenol, sodium chloride flush  Review of Systems: Constitutional: Weight is stable.  Eyes: No changes in vision. ENT: No oral  lesions, sore throat.  GI: see HPI.  Heme/Lymph: No easy bruising.  CV: No chest pain.  GU: No hematuria.  Integumentary: No rashes.  Neuro: No headaches.  Psych: No depression/anxiety.  Endocrine: No heat/cold intolerance.  Allergic/Immunologic: No urticaria.  Resp: No cough, SOB.  Musculoskeletal: No joint swelling.    Physical Examination: BP (!) 147/79 (BP Location: Left Arm)   Pulse 80   Temp 97.7 F (36.5 C) (Axillary)   Resp 20   Ht 5\' 5"  (1.651 m)   Wt 51.3 kg   SpO2 98%   BMI 18.82 kg/m   Ill-appearing elderly female laying down in hospital bed. Answers questions. Son present at bedside.  Gen: NAD, alert and oriented x 4 HEENT: PEERLA, EOMI, Neck: supple, no JVD or thyromegaly Chest: CTA bilaterally, no wheezes, crackles, or other adventitious sounds CV: RRR, no m/g/c/r Abd: soft, distended, hypoactive BS in all four quadrants; tenderness to deep palpation diffusely, no HSM, guarding, ridigity, or rebound tenderness Ext: no edema, well perfused with 2+ pulses, Skin: no rash or lesions noted Lymph: no LAD  Data: Lab Results  Component Value Date   WBC 9.7 10/09/2020   HGB 11.5 (L) 10/09/2020   HCT 34.5 (L) 10/09/2020   MCV 91.3 10/09/2020   PLT 287 10/09/2020   Recent Labs  Lab 10/07/20 0342 10/08/20 0531 10/09/20 0354  HGB 11.3* 12.6 11.5*   Lab Results  Component Value  Date   NA 133 (L) 10/10/2020   K 3.7 10/10/2020   CL 98 10/10/2020   CO2 27 10/10/2020   BUN 25 (H) 10/10/2020   CREATININE 1.36 (H) 10/10/2020   Lab Results  Component Value Date   ALT 9 10/07/2020   AST 12 (L) 10/07/2020   ALKPHOS 40 10/07/2020   BILITOT 0.5 10/07/2020   Recent Labs  Lab 10/06/20 0838  INR 1.1   Assessment/Plan:  79 y/o Caucasian female with a PMH of hypothyroidism, hypertension, tobacco use disorder, COPD, CKD stage IIIb, solitary left kidney with chronic hydronephrosis (s/p right nephrectomy in 2008/2009),history of stroke,paroxysmal atrial  fibrillation, chronic bilateral carotid artery stenosis, 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 coli UTI presented to the ED with abdominal distention 2/2 extrinsic duodenal compression from markedly dilated right renal pelvis  1. Duodenal compression - extrinsic compression 2/2 markedly dilated right renal pelvis and left chronic UPJ stenosis s/p left ureteral stent placement  2. Diffuse abdominal pain - 2/2 #1  3. Severe constipation - responding well to Miralax 34 grams daily through NG tube, mag citrate as well  4. Atrial fibrillation - on Eliquis. Rate controlled.  5. Severe protein calorie malnutrition - dietary following, plan to start TPN   Recommendations:  -Patient appears to be worse today with worsening nausea, abd pain, and weakness. -She has responded well to Miralax and mag citrate and is having BMs now which is encouraging -Repeat abdominal x-rays today showed nonobstructive bowel gas pattern -Advise enemas to help remove stool distally -If abnormal or clinical course worsens, recommend repeat CT to reassess duodenal compression -If there is persistent duodenal compression, EGD would be indicated -Following along with you -Appreciate surgery recs. No surgical intervention needed at present time.  Please call with questions or concerns.    Octavia Bruckner, PA-C [facility] [phone] (Cell)

## 2020-10-10 NOTE — Progress Notes (Addendum)
Peripherally Inserted Central Catheter Placement  The IV Nurse has discussed with the patient and/or persons authorized to consent for the patient, the purpose of this procedure and the potential benefits and risks involved with this procedure.  The benefits include less needle sticks, lab draws from the catheter, and the patient may be discharged home with the catheter. Risks include, but not limited to, infection, bleeding, blood clot (thrombus formation), and puncture of an artery; nerve damage and irregular heartbeat and possibility to perform a PICC exchange if needed/ordered by physician.  Alternatives to this procedure were also discussed.  Bard Power PICC patient education guide, fact sheet on infection prevention and patient information card has been provided to patient /or left at bedside. Previous PICC exchanged due to poor tip placement.    PICC Placement Documentation  PICC Double Lumen 10/09/20 PICC Right Brachial 41 cm 0 cm (Active)  Indication for Insertion or Continuance of Line Administration of hyperosmolar/irritating solutions (i.e. TPN, Vancomycin, etc.) 10/09/20 2234  Exposed Catheter (cm) 0 cm 10/09/20 2234  Site Assessment Clean;Dry;Intact 10/09/20 2234  Lumen #1 Status Blood return noted;Flushed;Saline locked 10/09/20 2234  Lumen #2 Status Blood return noted;Flushed;Saline locked 10/09/20 2234  Dressing Type Transparent;Occlusive;Securing device 10/09/20 2234  Dressing Status Clean;Dry;Intact 10/09/20 2234  Antimicrobial disc in place? Yes 10/09/20 2234  Safety Lock Not Applicable 13/08/65 7846  Line Adjustment (NICU/IV Team Only) No 10/09/20 2234  Dressing Intervention New dressing 10/09/20 2234  Dressing Change Due 10/16/20 10/09/20 2234       Edson Snowball 10/10/2020, 4:41 AM

## 2020-10-10 NOTE — Consult Note (Signed)
PHARMACY - TOTAL PARENTERAL NUTRITION CONSULT NOTE   Indication: concern for possible obstruction related to extrinsic compression of duodenum and inability to assess patient's tolerance of PO diet due to poor intake  Patient Measurements: Height: 5\' 5"  (165.1 cm) Weight: 51.3 kg (113 lb 1.5 oz) IBW/kg (Calculated) : 57 TPN AdjBW (KG): 51.3 Body mass index is 18.82 kg/m. Usual Weight: 51.5 kg  Assessment: Due to concern for possible obstruction related to extrinsic compression of duodenum and inability to assess patient's tolerance of PO diet due to poor intake, plan is for initiation of TPN. Patient has still not had any flatus or BMs.  Glucose / Insulin: BG 78-100 Electrolytes:  Na 133, K+ 3.7, Phos 2.2, Mg 2.1 Renal: Scr 1.36  LFTs / TGs: WNL. Will order TG for 11/21.  Prealbumin / albumin: 2.1 Intake / Output; MIVF: NS @75  mL/hr. Daily output -1.8 L GI Imaging: none,  Surgeries / Procedures: 11/8 cystopscopy with stent placement  Central access: 11/20 TPN start date: 11/20  Nutritional Goals (per RD recommendation on 11/19): kCal: 1400-1600, Protein: 75-85, Fluid: 1.3-1.5 L/day Goal TPN rate is 62 mL/hr (provides 81.84 g of protein and 1532.24 kcals per day)  Current Nutrition:  TPN  Plan:  Start TPN at 65mL/hr at 1800 (1/3 of goal rate due to risk of refeeding) Electrolytes in TPN: 57mEq/L of Na, 48mEq/L of K, 52mEq/L of Ca, 73mEq/L of Mg, and 74mmol/L of Phos (increased phos due to low phos level and shortage of IV phos). Cl:Ac ratio 1:1 Add standard MVI and trace elements to TPN Initiate Sensitive q8h SSI and adjust as needed  Continue MIVF at 75 mL/hr at 1800 Monitor TPN labs on Mon/Thurs, f/u with TG with AM labs.   Oswald Hillock, PharmD, BCPS 10/10/2020,9:01 AM

## 2020-10-10 NOTE — Consult Note (Signed)
Patient ID: Cathy Solis, female   DOB: 1941/05/31, 79 y.o.   MRN: [id number]  Chief Complaint: Persistent abdominal distention, complaints of abdominal pain  History of Present Illness Cathy Solis is a 79 y.o. 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  Causing a degree of mechanical obstruction of the duodenum.  However, we have seen contrast passed through this area.  She underwent treatment with NG tube decompression, the NG tube remains in place despite reportedly not being utilized for suction.  She had some recent emesis of orange sherbet, nothing consistent with small bowel obstruction.  Her son feels she has had some deterioration, and was present at bedside to share the history with me.  She reportedly has had some well-formed bowel movement of late, this seems to be in response to the MiraLAX given through the NG tube.  To my understanding no enemas have been administered.  Today's x-ray seems to be consistent with gas throughout the colon with exception of the rectum itself.  Son reports recent digital rectal exam by another provider found well formed stool, consistent with likely impaction. Concerns were raised regarding the role of the right femoral hernia, which on her CT scan was noted to contain bowel.  Past Medical History Past Medical History:  Diagnosis Date  . Breast cancer [facility]) 2006   Left breast, s/p radiation  . 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 Hypertension   . Kidney problem    Undeveloped R kidney  . 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 Personal history of tobacco use, presenting hazards to health 03/18/2016  . Stroke [facility])    residual left sided weakness  . Toe infection    followed by Dr. Jens Som      Past Surgical History:  Procedure Laterality Date  . ABDOMINAL HYSTERECTOMY    . BLADDER REPAIR    . BREAST LUMPECTOMY Left   . BREAST  SURGERY     left  . CAROTID ENDARTERECTOMY  2008   left  . COLONOSCOPY  2008  . CYSTOSCOPY WITH STENT PLACEMENT Left 10/08/2020   Procedure: CYSTOSCOPY WITH STENT PLACEMENT;  Surgeon: Billey Co, MD;  Location: [facility] ORS;  Service: Urology;  Laterality: Left;  . EYE SURGERY Right 2013   cataract  . KIDNEY SURGERY  1949    Allergies  Allergen Reactions  . Statins Other (See Comments)    Myalgias  . Zetia [Ezetimibe] Other (See Comments)    myalgias    Current Facility-Administered Medications  Medication Dose Route Frequency Provider Last Rate Last Admin  . 0.9 %  sodium chloride infusion   Intravenous Continuous Nicole Kindred A, DO      . acetaminophen (TYLENOL) tablet 650 mg  650 mg Per Tube Q6H PRN Billey Co, MD   650 mg at 10/08/20 1736  . albuterol (VENTOLIN HFA) 108 (90 Base) MCG/ACT inhaler 2 puff  2 puff Inhalation Q6H PRN Billey Co, MD      . apixaban Arne [place]) tablet 2.5 mg  2.5 mg Oral BID Nicole Kindred A, DO   2.5 mg at 10/10/20 0853  . bisacodyl (DULCOLAX) EC tablet 5 mg  5 mg Oral Daily PRN Billey Co, MD   5 mg at 10/09/20 2051  . bisacodyl (DULCOLAX) suppository 10 mg  10 mg Rectal Daily PRN Billey Co, MD      . cefTRIAXone (ROCEPHIN)  1 g in sodium chloride 0.9 % 100 mL IVPB  1 g Intravenous Q24H Nickolas Madrid C, MD 200 mL/hr at 10/09/20 2249 1 g at 10/09/20 2249  . Chlorhexidine Gluconate Cloth 2 % PADS 6 each  6 each Topical Daily Fritzi Mandes, MD   6 each at 10/09/20 0909  . feeding supplement (ENSURE ENLIVE / ENSURE PLUS) liquid 237 mL  237 mL Oral TID BM Billey Co, MD   237 mL at 10/09/20 0909  . fluticasone furoate-vilanterol (BREO ELLIPTA) 200-25 MCG/INH 1 puff  1 puff Inhalation Daily Billey Co, MD   1 puff at 10/10/20 0914  . hydrALAZINE (APRESOLINE) injection 10 mg  10 mg Intravenous Q6H PRN Nickolas Madrid C, MD      . insulin aspart (novoLOG) injection 0-9 Units  0-9 Units Subcutaneous Q8H Oswald Hillock,  RPH      . iohexol (OMNIPAQUE) 9 MG/ML oral solution 500 mL  500 mL Oral BID PRN Billey Co, MD   500 mL at 10/06/20 0923  . ipratropium-albuterol (DUONEB) 0.5-2.5 (3) MG/3ML nebulizer solution 3 mL  3 mL Nebulization BID Billey Co, MD   3 mL at 10/10/20 0800  . levothyroxine (SYNTHROID) tablet 150 mcg  150 mcg Per Tube [id number] Billey Co, MD   150 mcg at 10/10/20 0629  . magic mouthwash  5 mL Oral QID Nicole Kindred A, DO   5 mL at 10/10/20 0851   And  . lidocaine (XYLOCAINE) 2 % viscous mouth solution 5 mL  5 mL Mouth/Throat QID Nicole Kindred A, DO   5 mL at 10/10/20 0850  . metoprolol tartrate (LOPRESSOR) tablet 50 mg  50 mg Per Tube BID Billey Co, MD   50 mg at 10/10/20 0852  . ondansetron (ZOFRAN) injection 4 mg  4 mg Intravenous Once Billey Co, MD      . ondansetron [facility]) injection 4 mg  4 mg Intravenous Q6H PRN Billey Co, MD   4 mg at 10/09/20 1802  . pantoprazole (PROTONIX) injection 40 mg  40 mg Intravenous Q12H Billey Co, MD   40 mg at 10/10/20 0850  . phenol (CHLORASEPTIC) mouth spray 1 spray  1 spray Mouth/Throat PRN Nicole Kindred A, DO   1 spray at 10/08/20 1737  . polyethylene glycol (MIRALAX / GLYCOLAX) packet 34 g  34 g Per NG tube BID Lin Landsman, MD   34 g at 10/10/20 0848  . sodium chloride flush (NS) 0.9 % injection 10-40 mL  10-40 mL Intracatheter Q12H Nicole Kindred A, DO   10 mL at 10/10/20 0916  . sodium chloride flush (NS) 0.9 % injection 10-40 mL  10-40 mL Intracatheter PRN Nicole Kindred A, DO      . sodium phosphate (FLEET) 7-19 GM/118ML enema 1 enema  1 enema Rectal q12n4p Ronny Bacon, MD      . sucralfate (CARAFATE) 1 GM/10ML suspension 1 g  1 g Per Tube TID WC & HS Billey Co, MD   1 g at 10/10/20 0850  . TPN ADULT (ION)   Intravenous Continuous TPN Oswald Hillock, RPH        Family History Family History  Problem Relation Age of Onset  . Stroke Mother   . Heart disease Father   . Heart  attack Maternal Grandfather       Social History Social History   Tobacco Use  . Smoking status: Current Some Day Smoker  Packs/day: 0.50    Years: 30.00    Pack years: 15.00    Types: Cigarettes    Last attempt to quit: 01/08/2019    Years since quitting: 1.7  . Smokeless tobacco: Never Used  Vaping Use  . Vaping Use: Never used  Substance Use Topics  . Alcohol use: No  . Drug use: No        Review of Systems  All other systems reviewed and are negative.   Physical Exam Blood pressure 137/89, pulse 94, temperature (!) 96.5 F (35.8 C), temperature source Axillary, resp. rate 16, height 5\' 5"  (1.651 m), weight 51.3 kg, SpO2 92 %. Last Weight  Most recent update: 10/06/2020  8:23 AM   Weight  51.3 kg (113 lb 1.5 oz)            CONSTITUTIONAL: Frail, cachectic and mal nourished, appropriately responsive and aware without distress.  She allows her son to do most of the talking, but responds appropriately and interjects.  Does not appear to be in any form of diminished consciousness. EYES: Sclera non-icteric.   EARS, NOSE, MOUTH AND THROAT: Mask worn.  NG tube in place, not connected to suction.  Hearing is intact to voice.  NECK: Trachea is midline, and there is no jugular venous distension.  LYMPH NODES:  Lymph nodes in the neck are not enlarged. RESPIRATORY:  Lungs are clear, and breath sounds are equal bilaterally. Normal respiratory effort without pathologic use of accessory muscles. CARDIOVASCULAR: Heart is regular in rate and rhythm. GI: The abdomen is asymmetrically distended, but quite soft, nontender. There were no palpable masses. I did not appreciate hepatosplenomegaly. There were normal bowel sounds.  She has a readily/easily reducible right groin hernia.  I could essentially palpate the small bowel and note immediate recurrence of the hernia.  The defect is readily appreciated, there is no hint of incarceration. MUSCULOSKELETAL:  Symmetrical muscle tone  appreciated in all four extremities.    SKIN: Skin turgor is normal. No pathologic skin lesions appreciated.  NEUROLOGIC:  Motor and sensation appear grossly normal.  Cranial nerves are grossly without defect. PSYCH:  Alert and oriented to person, place and time. Affect is appropriate for situation.  Data Reviewed I have personally reviewed what is currently available of the patient's imaging, recent labs and medical records.   Labs:  CBC Latest Ref Rng & Units 10/09/2020 10/08/2020 10/07/2020  WBC 4.0 - 10.5 K/uL 9.7 11.2(H) 10.7(H)  Hemoglobin 12.0 - 15.0 g/dL 11.5(L) 12.6 11.3(L)  Hematocrit 36 - 46 % 34.5(L) 38.2 34.3(L)  Platelets 150 - 400 K/uL 287 363 358   CMP Latest Ref Rng & Units 10/10/2020 10/09/2020 10/08/2020  Glucose 70 - 99 mg/dL 100(H) 78 70  BUN 8 - 23 mg/dL 25(H) 25(H) 24(H)  Creatinine 0.44 - 1.00 mg/dL 1.36(H) 1.37(H) 1.29(H)  Sodium 135 - 145 mmol/L 133(L) 134(L) 133(L)  Potassium 3.5 - 5.1 mmol/L 3.7 3.6 3.8  Chloride 98 - 111 mmol/L 98 99 98  CO2 22 - 32 mmol/L 27 26 25   Calcium 8.9 - 10.3 mg/dL 8.3(L) 8.4(L) 8.3(L)  Total Protein 6.5 - 8.1 g/dL - - -  Total Bilirubin 0.3 - 1.2 mg/dL - - -  Alkaline Phos 38 - 126 U/L - - -  AST 15 - 41 U/L - - -  ALT 0 - 44 U/L - - -      Imaging:  Within last 24 hrs: DG Chest Port 1 View  Result Date: 10/09/2020 CLINICAL DATA:  80 year old female status post PICC placement. EXAM: PORTABLE CHEST 1 VIEW COMPARISON:  Earlier radiograph dated 10/09/2020. FINDINGS: Interval advancement of the right-sided PICC with tip at the cavoatrial junction. Enteric tube extends below the diaphragm with tip beyond the inferior margin of the image. Similar appearance of right pleural effusion and right lung base atelectasis or infiltrate. No pneumothorax. Stable cardiomediastinal silhouette. No acute osseous pathology. IMPRESSION: Interval advancement of the right-sided PICC with tip at the cavoatrial junction. No other interval change.  Electronically Signed   By: Anner Crete M.D.   On: 10/09/2020 23:09   DG Chest Port 1 View  Result Date: 10/09/2020 CLINICAL DATA:  Right PICC line placement. EXAM: PORTABLE CHEST 1 VIEW COMPARISON:  September 29, 2020 FINDINGS: The right PICC line likely terminates just below the brachiocephalic confluence, 8.8 cm above the caval atrial junction. A right-sided pleural effusion with underlying opacity has increased in the interval. No other changes. An NG tube terminates in the left upper quadrant of the abdomen. IMPRESSION: 1. The new right PICC line distal tip is likely just inferior to the brachiocephalic confluence, 8.8 cm above the caval atrial junction. 2. Increasing effusion and underlying opacity in the right base. 3. No other changes. Electronically Signed   By: Dorise Bullion III M.D   On: 10/09/2020 21:26   DG Abd Portable 2V  Result Date: 10/10/2020 CLINICAL DATA:  History of duodenal obstruction. Left ureteral stent placed. EXAM: PORTABLE ABDOMEN - 2 VIEW COMPARISON:  10/09/2020 FINDINGS: Nasogastric tube has tip and side-port over the region of the stomach in the left upper quadrant. Double-J left-sided internal ureteral stent appears in adequate position and unchanged. Bowel gas pattern demonstrates air throughout the colon with mild fecal retention over the rectum. No evidence of free peritoneal air. No definite dilated small bowel loops. Remainder of the exam is unchanged. IMPRESSION: 1. Nonobstructive bowel gas pattern with mild fecal retention over the rectum. 2. Nasogastric tube and left-sided double-J internal ureteral stent unchanged. Electronically Signed   By: Marin Olp M.D.   On: 10/10/2020 11:22   [place] EKG SITE RITE  Result Date: 10/09/2020 If Site Rite image not attached, placement could not be confirmed due to current cardiac rhythm.   Assessment    Distal duodenal bowel obstruction secondary to SMA/UPJ dilatation, with current tolerance of  liquids. Persistent/resolving ileus with suspected distal fecal impaction. Patient Active Problem List   Diagnosis Date Noted  . Abdominal pain 10/06/2020  . Hematemesis with nausea   . Upper GI bleed   . UTI (urinary tract infection) 09/27/2020  . Acute pyelonephritis 09/27/2020  . Unintentional weight loss 09/27/2020  . Debility 09/27/2020  . Atrial fibrillation ([place])   . Severe protein-calorie malnutrition ([place]) 09/03/2020  . 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 Chronic kidney disease (CKD), stage III (moderate) ([place]) 08/20/2019  . RBBB 05/09/2019  . Pressure injury of skin 01/15/2019  . COPD (chronic obstructive pulmonary disease) ([place]) 01/14/2019  . Hypertension 05/04/2018  . Osteoporosis 11/02/2017  . Coronary atherosclerosis 05/23/2016  . Personal history of tobacco use, presenting hazards to health 03/18/2016  . Idiopathic scoliosis 11/28/2014  . Tobacco abuse 11/28/2014  . 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 Hydronephrosis 02/04/2014  . 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 Carotid stenosis 05/23/2013  . Pernicious anemia 06/26/2012  . Hyperlipidemia 12/29/2011  . 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 Hypothyroidism 08/04/2011    Plan    In discussion with the son and patient,  we do not feel any urgent repair of the right groin is going to be helpful in resolving the intestinal issues she is dealing with right now.  I suspect there may be a mechanical obstruction distally in her colon and rectum.  And expect that this will improve with administration of enemas.  If not over the next 2 days, would advise utilization of a water-soluble enema that may be both therapeutic and diagnostic. I agree with gastroenterology that if her duodenal obstruction continues to be an issue with her tolerance of full liquid nutrition, further evaluation is necessary. We will be happy to continue to follow this patient along with  you. Face-to-face time spent with the patient and accompanying care providers(if present) was 50 minutes, with more than 50% of the time spent counseling, educating, and coordinating care of the patient.      Ronny Bacon M.D., FACS 10/10/2020, 11:43 AM

## 2020-10-11 DIAGNOSIS — R1012 Left upper quadrant pain: Secondary | ICD-10-CM | POA: Diagnosis not present

## 2020-10-11 DIAGNOSIS — E43 Unspecified severe protein-calorie malnutrition: Secondary | ICD-10-CM | POA: Diagnosis not present

## 2020-10-11 DIAGNOSIS — K315 Obstruction of duodenum: Secondary | ICD-10-CM | POA: Diagnosis not present

## 2020-10-11 DIAGNOSIS — Q6211 Congenital occlusion of ureteropelvic junction: Secondary | ICD-10-CM | POA: Diagnosis not present

## 2020-10-11 DIAGNOSIS — K4090 Unilateral inguinal hernia, without obstruction or gangrene, not specified as recurrent: Secondary | ICD-10-CM

## 2020-10-11 LAB — BASIC METABOLIC PANEL
Anion gap: 10 (ref 5–15)
BUN: 25 mg/dL — ABNORMAL HIGH (ref 8–23)
CO2: 24 mmol/L (ref 22–32)
Calcium: 8.1 mg/dL — ABNORMAL LOW (ref 8.9–10.3)
Chloride: 99 mmol/L (ref 98–111)
Creatinine, Ser: 1.32 mg/dL — ABNORMAL HIGH (ref 0.44–1.00)
GFR, Estimated: 41 mL/min — ABNORMAL LOW (ref >60)
Glucose, Bld: 104 mg/dL — ABNORMAL HIGH (ref 70–99)
Potassium: 3.2 mmol/L — ABNORMAL LOW (ref 3.5–5.1)
Sodium: 133 mmol/L — ABNORMAL LOW (ref 135–145)

## 2020-10-11 LAB — TRIGLYCERIDES: Triglycerides: 124 mg/dL (ref <150)

## 2020-10-11 LAB — GLUCOSE, CAPILLARY
Glucose-Capillary: 118 mg/dL — ABNORMAL HIGH (ref 70–99)
Glucose-Capillary: 107 mg/dL — ABNORMAL HIGH (ref 70–99)
Glucose-Capillary: 112 mg/dL — ABNORMAL HIGH (ref 70–99)
Glucose-Capillary: 136 mg/dL — ABNORMAL HIGH (ref 70–99)
Glucose-Capillary: 121 mg/dL — ABNORMAL HIGH (ref 70–99)

## 2020-10-11 LAB — MAGNESIUM: Magnesium: 1.9 mg/dL (ref 1.7–2.4)

## 2020-10-11 LAB — PHOSPHORUS: Phosphorus: 2.5 mg/dL (ref 2.5–4.6)

## 2020-10-11 MED ORDER — SODIUM CHLORIDE 0.9 % IV SOLN: INTRAVENOUS | Status: DC

## 2020-10-11 MED ORDER — TRAVASOL 10 % IV SOLN
INTRAVENOUS | Status: AC
  Filled 2020-10-11: qty 554.4

## 2020-10-11 MED ORDER — TRAVASOL 10 % IV SOLN: INTRAVENOUS | Status: DC

## 2020-10-11 MED ORDER — POTASSIUM CHLORIDE 20 MEQ PO PACK
40.0000 meq | PACK | Freq: Once | ORAL | Status: AC
  Administered 2020-10-11: 16:00:00 40 meq
  Filled 2020-10-11 (×2): qty 2

## 2020-10-11 MED ORDER — POLYETHYLENE GLYCOL 3350 17 G PO PACK: 17.0000 g | PACK | Freq: Every day | ORAL | Status: DC | PRN

## 2020-10-11 MED ORDER — POTASSIUM CHLORIDE 20 MEQ PO PACK: 40.0000 meq | PACK | Freq: Once | ORAL | Status: DC

## 2020-10-11 NOTE — Consult Note (Signed)
PHARMACY - TOTAL PARENTERAL NUTRITION CONSULT NOTE   Indication: concern for possible obstruction related to extrinsic compression of duodenum and inability to assess patient's tolerance of PO diet due to poor intake  Patient Measurements: Height: 5\' 5"  (165.1 cm) Weight: 51.3 kg (113 lb 1.5 oz) IBW/kg (Calculated) : 57 TPN AdjBW (KG): 51.3 Body mass index is 18.82 kg/m. Usual Weight: 51.5 kg  Assessment: Due to concern for possible obstruction related to extrinsic compression of duodenum and inability to assess patient's tolerance of PO diet due to poor intake, plan is for initiation of TPN. Patient has still not had any flatus or BMs. Glucose / Insulin: BG 104-118 no insulin given. A1c 5.9  Electrolytes:  Na 133, K+ 3.7 > 3.2, Phos 2.2, Mg 2.1 Renal: Scr 1.36 > 1.32 LFTs / TGs: WNL. Prealbumin / albumin: 2.1 Intake / Output; MIVF: NS @75  mL/hr. Daily output -16 L GI Imaging: none,  Surgeries / Procedures: 11/8 cystopscopy with stent placement  Central access: 11/20 TPN start date: 11/20  Nutritional Goals (per RD recommendation on 11/19): kCal: 1400-1600, Protein: 75-85, Fluid: 1.3-1.5 L/day Goal TPN rate is 62 mL/hr (provides 81.84 g of protein and 1532.24 kcals per day)  Current Nutrition:  TPN  Plan:  Increase TPN to 42 mL/hr at 1800 (2/3 of goal rate due to risk of refeeding) Electrolytes in TPN: 58mEq/L of Na, 13mEq/L of K, 30mEq/L of Ca, 59mEq/L of Mg, and 76mmol/L of Phos. Cl:Ac ratio 1:1 Replacing K+ with KCl packet via tube.  Add standard MVI and trace elements to TPN Initiate Sensitive q8h SSI and adjust as needed  Continue MIVF at 20 mL/hr at 1800 Monitor TPN labs on Mon/Thurs. CBC, Albumin, Phos, Mg, CMP.   Oswald Hillock, PharmD, BCPS 10/11/2020,10:33 AM

## 2020-10-11 NOTE — Progress Notes (Signed)
GI Inpatient Follow-up Note  Subjective:  Patient seen in follow-up for extrinsic duodenal compression 2/2 markedly dilated right renal pelvis s/p left ureteral stent placement by Dr. Diamantina Providence 11/18. No acute events overnight. Patient has had 10 BMs over the last 24 hours with addition of scheduled Miralax and enemas. Per nursing report, patient has been doing better with return of bowel function. Patient denies any new complaints. She reports "I feel awful. I want real food." Sister present in room.   Scheduled Inpatient Medications:  . apixaban  2.5 mg Oral BID  . Chlorhexidine Gluconate Cloth  6 each Topical Daily  . feeding supplement  237 mL Oral TID BM  . fluticasone furoate-vilanterol  1 puff Inhalation Daily  . insulin aspart  0-9 Units Subcutaneous Q8H  . ipratropium-albuterol  3 mL Nebulization BID  . levothyroxine  150 mcg Per Tube Q0600  . magic mouthwash  5 mL Oral QID   And  . lidocaine  5 mL Mouth/Throat QID  . metoprolol tartrate  50 mg Per Tube BID  . ondansetron (ZOFRAN) IV  4 mg Intravenous Once  . pantoprazole (PROTONIX) IV  40 mg Intravenous Q12H  . potassium chloride  40 mEq Per Tube Once  . sodium chloride flush  10-40 mL Intracatheter Q12H  . sucralfate  1 g Per Tube TID WC & HS    Continuous Inpatient Infusions:   . sodium chloride 75 mL/hr at 10/11/20 0219  . sodium chloride    . cefTRIAXone (ROCEPHIN)  IV 1 g (10/10/20 1552)  . TPN ADULT (ION) 21 mL/hr at 10/10/20 1800  . TPN ADULT (ION)      PRN Inpatient Medications:  acetaminophen, albuterol, bisacodyl, bisacodyl, hydrALAZINE, iohexol, ondansetron (ZOFRAN) IV, phenol, polyethylene glycol, sodium chloride flush  Review of Systems: Constitutional: Weight is stable.  Eyes: No changes in vision. ENT: No oral lesions, sore throat.  GI: see HPI.  Heme/Lymph: No easy bruising.  CV: No chest pain.  GU: No hematuria.  Integumentary: No rashes.  Neuro: No headaches.  Psych: No depression/anxiety.   Endocrine: No heat/cold intolerance.  Allergic/Immunologic: No urticaria.  Resp: No cough, SOB.  Musculoskeletal: No joint swelling.    Physical Examination: BP (!) 114/52 (BP Location: Left Arm)   Pulse 75   Temp (!) 97.1 F (36.2 C) (Axillary)   Resp 20   Ht 5\' 5"  (1.651 m)   Wt 51.3 kg   SpO2 97%   BMI 18.82 kg/m  Ill-appearing, cachetic elderly female laying down in hospital bed. Answers questions. Son present at bedside.  Gen: NAD, alert and oriented x 4 HEENT: PEERLA, EOMI, Neck: supple, no JVD or thyromegaly Chest: CTA bilaterally, no wheezes, crackles, or other adventitious sounds CV: RRR, no m/g/c/r Abd: soft, distended, hypoactive BS in all four quadrants; tenderness to deep palpation diffusely, no HSM, guarding, ridigity, or rebound tenderness Ext: no edema, well perfused with 2+ pulses, Skin: no rash or lesions noted Lymph: no LAD   Data: Lab Results  Component Value Date   WBC 9.7 10/09/2020   HGB 11.5 (L) 10/09/2020   HCT 34.5 (L) 10/09/2020   MCV 91.3 10/09/2020   PLT 287 10/09/2020   Recent Labs  Lab 10/07/20 0342 10/08/20 0531 10/09/20 0354  HGB 11.3* 12.6 11.5*   Lab Results  Component Value Date   NA 133 (L) 10/11/2020   K 3.2 (L) 10/11/2020   CL 99 10/11/2020   CO2 24 10/11/2020   BUN 25 (H) 10/11/2020   CREATININE  1.32 (H) 10/11/2020   Lab Results  Component Value Date   ALT 9 10/07/2020   AST 12 (L) 10/07/2020   ALKPHOS 40 10/07/2020   BILITOT 0.5 10/07/2020   Recent Labs  Lab 10/06/20 0838  INR 1.1   Assessment/Plan:  79 y/o Caucasian female with a PMH of hypothyroidism, hypertension, tobacco use disorder, COPD, CKD stage IIIb, 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 coli UTI presented to the ED  with abdominal distention 2/2 extrinsic duodenal compression from markedly dilated right renal pelvis  1. Duodenal compression - extrinsic compression 2/2 markedly dilated right renal pelvis and left chronic UPJ stenosis s/p left ureteral stent placement  2. Diffuse abdominal pain - 2/2 #1  3. Severe constipation - has had >10 BMs over last due with scheduled Miralax and enemas  4. Atrial fibrillation - on Eliquis. Rate controlled.  5. Severe protein calorie malnutrition - dietary following  Recommendations:  - Agree with pulling NG tube today - Advance diet to soft diet - Continue IV Protonix BID. Can switch to oral as she tolerates. - D/C scheduled Miralax and enemas and use as PRN daily if no BM in 24 hours - No luminal evaluation is indicated. Responding well clinically to bowel regimen and bowel function has returned.  - Repeat serial abdominal examinations - Dr. Marius Ditch will resume taking over patient's care tomorrow   Please call with questions or concerns.    Octavia Bruckner, PA-C [facility] [phone] (Cell)

## 2020-10-11 NOTE — Progress Notes (Signed)
PROGRESS NOTE    Cathy Solis   MGQ:[id number]  DOB: 16-Feb-1941  PCP: Valerie Roys, DO    DOA: 10/06/2020 LOS: 5   Brief Narrative   HPI on admission, by Dr. Posey Pronto: "Cathy Solis is a 79 y.o. 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  Patient was recently hospitalized on 09/27/2020 and discharged on 10/05/2020.  Patient during recent admission was admitted for pyelonephritis, and discharged with antibiotics and outpatient follow-up with urologist, cardiologist and primary care provider.  She was found to have acute [place] and AKI with culture showing E. coli and discharged on p.o. Levaquin.  Patient was also seen by cardiologist for A. fib RVR and started on Eliquis for CHADS2 score of 6. Prior to previous admission patient was not on home O2 was discharged on 2 L by nasal cannula.  Sister [name] was involved in patient's care and her son [name]."     Assessment & Plan   Principal Problem:   Abdominal pain Active Problems:   Hypothyroidism   Hydronephrosis   Tobacco abuse   Hypertension   Severe protein-calorie malnutrition (HCC)   Atrial fibrillation (HCC)  Abdominal Pain - multifactorial due to duodenal compression vs SBO due to hernia vs stool impaction vs ?PUD / esophagitis.  Imaging on admission concerning for extrinsic compression of duodenum by severe renal pelvis dilation and SMA, markedly distended stomach consistent with this as well.  NG tube was placed with some improvement when it was on suction.   11/20 Distention and pain worse with NG off suction and laxatives given per NG.   11/21 Much improvement once bowel regimen  finally took effect. --Consults: GI, urology, general surgery --Discontinue NG today 11/21 --Advance to soft diet --IV Protonix BID --Back off bowel regimen, as below --Monitor abdominal exam closely  Acute kidney injury vs CKD - POA.  Hold lisinopril for now (BP soft as well).  Monitor BMP.    Acute vs Recurrent Pyelonephritis - recently admitted for this, as above.  Continue IV Rocephin.  Urine culture negative this admission.  Urology recommends 2 weeks of antibiotics.   --11/21: small amount gross hematuria noted in Foley bag  Left hydronephrosis - urology consulted.  Chronic issue that appears stable on imaging from prior.  Status post Left ureteral stent placement with Dr. Diamantina Providence on 11/18.   Maintain Foley this weekend / until clinically improving.  Continue IV antibiotics as above. Urology anticipates long-term stent exchanges every 6-12 months.  Follow up in urology clinic in 1 month.  Constipation - d/c scheduled MiraLAX and enemas for now, will give daily PRN if no BM in 24 hours.  Also has PRN Dulcolax PO or suppositories.  Oral thrush - magic mouthwash with lidocaine A-fib - rate controlled.  Continue Eliquis, metoprolol.  Telemetry monitoring.  Essential hypertension - lisinopril is held due to AKI on admission and BP soft without it.  Continue metoprolol.  PRN hydralazine IV.  Hypothyroidism - continue levothyroxine.  TSH normal.  Severe protein calorie malnutrition - dietician following.  Oral nutrition supplements per orders.  Follow daily K, Mg, Phos due to high risk for refeeding syndrome.  Body mass index is 18.82 kg/m.  --PICC line  --On  short term TPN  --Dietician following  Generalized weakness - discharged to [facility] for short term rehab after prior admission.  OT recommends SNF.  PT evaluation pending.  Hx of tobacco abuse - nicotine patch discontinued at patient request.  She does smoke 1-2 ppd, but declines NRT.   DVT prophylaxis: apixaban  (ELIQUIS) tablet 2.5 mg Start: 10/08/20 2200 SCDs Start: 10/06/20 1336.  On Eliquis   Diet:  Diet Orders (From admission, onward)    Start     Ordered   10/11/20 0916  DIET SOFT Room service appropriate? Yes; Fluid consistency: Thin  Diet effective now       Question Answer Comment  Room service appropriate? Yes   Fluid consistency: Thin      10/11/20 0915            Code Status: Full Code    Subjective 10/11/20    Pt seen with sister at bedside today. Pt states she feels like crap, would not know what her stomach feels like because there is nothing in it.  Wants to have real food.  No other acute complaints.  Has had many liquid stools after aggressive bowel regimen.  Wants to go home, not to rehab.   Disposition Plan & Communication   Status is: Inpatient  Remains inpatient appropriate because:Inpatient level of care appropriate due to severity of illness, inadequate oral intake on TPN, on IV antibiotics.  Advancing diet and NG will be d/c today.   Dispo: The patient is from: Home              Anticipated d/c is to: Home              Anticipated d/c date is: 2 days              Patient currently is not medically stable to d/c.   Family Communication: sister at bedside on rounds today.     Consults, Procedures, Significant Events   Consultants:   Gastroenterology  Urology  General surgery  Procedures:   NG tube placed / discontinued on 11/21  Ureteral stent placement 11/18  Antimicrobials:  Anti-infectives (From admission, onward)   Start     Dose/Rate Route Frequency Ordered Stop   10/07/20 1530  cefTRIAXone (ROCEPHIN) 1 g in sodium chloride 0.9 % 100 mL IVPB        1 g 200 mL/hr over 30 Minutes Intravenous Every 24 hours 10/07/20 1435          Objective   Vitals:   10/10/20 2001 10/10/20 2357 10/11/20 0457 10/11/20 0905  BP:  113/61 115/65 (!) 114/52  Pulse:  73 74 75  Resp:  18 18 20   Temp:  98.7 F (37.1 C) 98.3 F (36.8 C) (!) 97.1 F  (36.2 C)  TempSrc:  Oral Oral Axillary  SpO2: 96% 95% 96% 97%  Weight:      Height:        Intake/Output Summary (Last 24 hours) at 10/11/2020 0916 Last data filed at 10/11/2020 4696 Gross per 24 hour  Intake 1302.31 ml  Output 1150 ml  Net 152.31 ml   Filed Weights   10/06/20 0822  Weight: 51.3 kg    Physical Exam:  General exam: awake, alert, no acute distress, cachectic, irritable HEENT: NG tube in L nare, moist mucus membranes Respiratory system: clear bilaterally, no wheezing, normal respiratory effort, [place] oxygen. Cardiovascular system: normal S1/S2, RRR, no pedal edema.   Gastrointestinal system: soft today, distention resolved, non-tender,  hyperactive bowel sounds Genitourinary: small amount gross hematuria in Foley bag   Labs   Data Reviewed: I have personally reviewed following labs and imaging studies  CBC: Recent Labs  Lab 10/06/20 0828 10/07/20 0342 10/08/20 0531 10/09/20 0354  WBC 14.7* 10.7* 11.2* 9.7  NEUTROABS 12.1*  --   --   --   HGB 12.4 11.3* 12.6 11.5*  HCT 37.2 34.3* 38.2 34.5*  MCV 92.3 92.2 92.3 91.3  PLT 415* 358 363 542   Basic Metabolic Panel: Recent Labs  Lab 10/07/20 0342 10/08/20 0531 10/09/20 0354 10/10/20 0423 10/11/20 0625  NA 132* 133* 134* 133* 133*  K 4.3 3.8 3.6 3.7 3.2*  CL 99 98 99 98 99  CO2 24 25 26 27 24   GLUCOSE 85 70 78 100* 104*  BUN 25* 24* 25* 25* 25*  CREATININE 1.47* 1.29* 1.37* 1.36* 1.32*  CALCIUM 8.4* 8.3* 8.4* 8.3* 8.1*  MG  --  1.7 1.7 2.1  --   PHOS  --  2.8 2.8 2.2*  --    GFR: Estimated Creatinine Clearance: 27.1 mL/min (A) (by C-G formula based on SCr of 1.32 mg/dL (H)). Liver Function Tests: Recent Labs  Lab 10/06/20 0828 10/07/20 0342  AST 13* 12*  ALT 10 9  ALKPHOS 47 40  BILITOT 0.5 0.5  PROT 6.3* 5.6*  ALBUMIN 2.4* 2.1*   Recent Labs  Lab 10/06/20 0828  LIPASE 45   No results for input(s): AMMONIA in the last 168 hours. Coagulation Profile: Recent Labs  Lab  10/06/20 0838  INR 1.1   Cardiac Enzymes: No results for input(s): CKTOTAL, CKMB, CKMBINDEX, TROPONINI in the last 168 hours. BNP (last 3 results) No results for input(s): PROBNP in the last 8760 hours. HbA1C: Recent Labs    10/10/20 0423  HGBA1C 5.9*   CBG: Recent Labs  Lab 10/10/20 2255 10/11/20 0608 10/11/20 0901  GLUCAP 123* 118* 107*   Lipid Profile: Recent Labs    10/11/20 0625  TRIG 124   Thyroid Function Tests: No results for input(s): TSH, T4TOTAL, FREET4, T3FREE, THYROIDAB in the last 72 hours. Anemia Panel: No results for input(s): VITAMINB12, FOLATE, FERRITIN, TIBC, IRON, RETICCTPCT in the last 72 hours. Sepsis Labs: Recent Labs  Lab 10/06/20 7062  LATICACIDVEN 1.0    Recent Results (from the past 240 hour(s))  SARS Coronavirus 2 by RT PCR (hospital order, performed in [facility] hospital lab) Nasopharyngeal Nasopharyngeal Swab     Status: None   Collection Time: 10/05/20  1:02 PM   Specimen: Nasopharyngeal Swab  Result Value Ref Range Status   SARS Coronavirus 2 NEGATIVE NEGATIVE Final    Comment: (NOTE) SARS-CoV-2 target nucleic acids are NOT DETECTED.  The SARS-CoV-2 RNA is generally detectable in upper and lower respiratory specimens during the acute phase of infection. The lowest concentration of SARS-CoV-2 viral copies this assay can detect is 250 copies / mL. A negative result does not preclude SARS-CoV-2 infection and should not be used as the sole basis for treatment or other patient management decisions.  A negative result may occur with improper specimen collection / handling, submission of specimen other than nasopharyngeal swab, presence of viral mutation(s) within the areas targeted by this assay, and inadequate number of viral copies (<250 copies / mL). A negative result must be combined with clinical observations, patient history, and epidemiological information.  Fact Sheet for Patients:    StrictlyIdeas.no  Fact Sheet for Healthcare Providers: BankingDealers.co.za  This test is not yet approved or  cleared by the [place] and has been authorized for detection and/or diagnosis of SARS-CoV-2 by FDA under an Emergency Use Authorization (EUA).  This EUA will remain in effect (meaning this test can be used) for the duration of the COVID-19 declaration under Section 564(b)(1) of the Act, 21 U.S.C. section 360bbb-3(b)(1), unless the authorization is terminated or revoked sooner.  Performed at [facility], [address] [place], [place] [address]   Resp Panel by RT PCR (RSV, Flu A&B, Covid) - Urine, Clean Catch     Status: None   Collection Time: 10/06/20  1:50 PM   Specimen: Urine, Clean Catch  Result Value Ref Range Status   SARS Coronavirus 2 by RT PCR NEGATIVE NEGATIVE Final    Comment: (NOTE) SARS-CoV-2 target nucleic acids are NOT DETECTED.  The SARS-CoV-2 RNA is generally detectable in upper respiratoy specimens during the acute phase of infection. The lowest concentration of SARS-CoV-2 viral copies this assay can detect is 131 copies/mL. A negative result does not preclude SARS-Cov-2 infection and should not be used as the sole basis for treatment or other patient management decisions. A negative result may occur with  improper specimen collection/handling, submission of specimen other than nasopharyngeal swab, presence of viral mutation(s) within the areas targeted by this assay, and inadequate number of viral copies (<131 copies/mL). A negative result must be combined with clinical observations, patient history, and epidemiological information. The expected result is Negative.  Fact Sheet for Patients:  PinkCheek.be  Fact Sheet for Healthcare Providers:  GravelBags.it  This test is no t yet approved or cleared by the [place]  FDA and  has been authorized for detection and/or diagnosis of SARS-CoV-2 by FDA under an Emergency Use Authorization (EUA). This EUA will remain  in effect (meaning this test can be used) for the duration of the COVID-19 declaration under Section 564(b)(1) of the Act, 21 U.S.C. section 360bbb-3(b)(1), unless the authorization is terminated or revoked sooner.     Influenza A by PCR NEGATIVE NEGATIVE Final   Influenza B by PCR NEGATIVE NEGATIVE Final    Comment: (NOTE) The Xpert Xpress SARS-CoV-2/FLU/RSV assay is intended as an aid in  the diagnosis of influenza from Nasopharyngeal swab specimens and  should not be used as a sole basis for treatment. Nasal washings and  aspirates are unacceptable for Xpert Xpress SARS-CoV-2/FLU/RSV  testing.  Fact Sheet for Patients: PinkCheek.be  Fact Sheet for Healthcare Providers: GravelBags.it  This test is not yet approved or cleared by the [place] FDA and  has been authorized for detection and/or diagnosis of SARS-CoV-2 by  FDA under an Emergency Use Authorization (EUA). This EUA will remain  in effect (meaning this test can be used) for the duration of the  Covid-19 declaration under Section 564(b)(1) of the Act, 21  U.S.C. section 360bbb-3(b)(1), unless the authorization is  terminated or revoked.    Respiratory Syncytial Virus by PCR NEGATIVE NEGATIVE Final    Comment: (NOTE) Fact Sheet for Patients: PinkCheek.be  Fact Sheet for Healthcare Providers: GravelBags.it  This test is not yet approved or cleared by the [place] FDA and  has been authorized for detection and/or diagnosis of SARS-CoV-2 by  FDA under an Emergency Use Authorization (EUA). This EUA will remain  in effect (meaning this test can be used) for the duration of the  COVID-19 declaration under Section 564(b)(1) of the Act, 21 U.S.C.  section  360bbb-3(b)(1), unless the authorization is terminated or  revoked. Performed at [facility], [address] [place], [place] [address]   Urine Culture     Status: None   Collection Time: 10/06/20  1:50 PM   Specimen: Urine, Random  Result Value Ref Range Status   Specimen Description   Final    URINE, RANDOM Performed at [facility], [address], [place], [place] [address]    Special Requests   Final    NONE Performed at [facility], [address], [place], [place] [address]    Culture   Final    NO GROWTH Performed at [facility], [place] [address] [place], [place] [address]    Report Status 10/08/2020 FINAL  Final      Imaging Studies   DG Chest Port 1 View  Result Date: 10/09/2020 CLINICAL DATA:  79 year old female status post PICC placement. EXAM: PORTABLE CHEST 1 VIEW COMPARISON:  Earlier radiograph dated 10/09/2020. FINDINGS: Interval advancement of the right-sided PICC with tip at the cavoatrial junction. Enteric tube extends below the diaphragm with tip beyond the inferior margin of the image. Similar appearance of right pleural effusion and right lung base atelectasis or infiltrate. No pneumothorax. Stable cardiomediastinal silhouette. No acute osseous pathology. IMPRESSION: Interval advancement of the right-sided PICC with tip at the cavoatrial junction. No other interval change. Electronically Signed   By: Anner Crete M.D.   On: 10/09/2020 23:09   DG Chest Port 1 View  Result Date: 10/09/2020 CLINICAL DATA:  Right PICC line placement. EXAM: PORTABLE CHEST 1 VIEW COMPARISON:  September 29, 2020 FINDINGS: The right PICC line likely terminates just below the brachiocephalic confluence, 8.8 cm above the caval atrial junction. A right-sided pleural effusion with underlying opacity has increased in the interval. No other changes. An NG tube terminates in the left upper quadrant of the abdomen. IMPRESSION: 1. The new right PICC  line distal tip is likely just inferior to the brachiocephalic confluence, 8.8 cm above the caval atrial junction. 2. Increasing effusion and underlying opacity in the right base. 3. No other changes. Electronically Signed   By: Dorise Bullion III M.D   On: 10/09/2020 21:26   DG Abd Portable 1V  Result Date: 10/09/2020 CLINICAL DATA:  Small bowel obstruction EXAM: PORTABLE ABDOMEN - 1 VIEW COMPARISON:  October 06, 2020 FINDINGS: The NG tube side port and distal tip are within the stomach. Small right pleural effusion with underlying atelectasis. No obvious free air or portal venous gas. A left ureteral stent is identified, apparently new in the interval. Scoliosis in lumbar spine. Vascular calcifications. Air-filled loops of large and small bowel are identified. No obvious bowel obstruction on this study. IMPRESSION: 1. The NG tube is in good position. There is a new left ureteral stent identified. 2. No obvious bowel obstruction on this study. 3. Small right pleural effusion with underlying atelectasis. Electronically Signed   By: Dorise Bullion III M.D   On: 10/09/2020 14:24   DG Abd Portable 2V  Result Date: 10/10/2020 CLINICAL DATA:  History of duodenal obstruction. Left ureteral stent placed. EXAM: PORTABLE ABDOMEN - 2 VIEW COMPARISON:  10/09/2020 FINDINGS: Nasogastric tube has tip and side-port over the region of the stomach in the left upper quadrant. Double-J left-sided internal ureteral stent appears in adequate position and unchanged. Bowel gas pattern demonstrates air throughout the colon with mild fecal retention over the rectum. No evidence of free peritoneal air. No definite dilated small bowel loops. Remainder of the exam is unchanged. IMPRESSION: 1. Nonobstructive bowel gas  pattern with mild fecal retention over the rectum. 2. Nasogastric tube and left-sided double-J internal ureteral stent unchanged. Electronically Signed   By: Marin Olp M.D.   On: 10/10/2020 11:22   [place] EKG SITE  RITE  Result Date: 10/09/2020 If Site Rite image not attached, placement could not be confirmed due to current cardiac rhythm.    Medications   Scheduled Meds: . apixaban  2.5 mg Oral BID  . Chlorhexidine Gluconate Cloth  6 each Topical Daily  . feeding supplement  237 mL Oral TID BM  . fluticasone furoate-vilanterol  1 puff Inhalation Daily  . insulin aspart  0-9 Units Subcutaneous Q8H  . ipratropium-albuterol  3 mL Nebulization BID  . levothyroxine  150 mcg Per Tube Q0600  . magic mouthwash  5 mL Oral QID   And  . lidocaine  5 mL Mouth/Throat QID  . metoprolol tartrate  50 mg Per Tube BID  . ondansetron (ZOFRAN) IV  4 mg Intravenous Once  . pantoprazole (PROTONIX) IV  40 mg Intravenous Q12H  . polyethylene glycol  34 g Per NG tube BID  . sodium chloride flush  10-40 mL Intracatheter Q12H  . sodium phosphate  1 enema Rectal q12n4p  . sucralfate  1 g Per Tube TID WC & HS   Continuous Infusions: . sodium chloride 75 mL/hr at 10/11/20 0219  . cefTRIAXone (ROCEPHIN)  IV 1 g (10/10/20 1552)  . TPN ADULT (ION) 21 mL/hr at 10/10/20 1800       LOS: 5 days    Time spent: 25 minutes with > 50% spent in coordination of care and direct patient contact    Ezekiel Slocumb, DO [facility]  10/11/2020, 9:16 AM    If 7PM-7AM, please contact night-coverage. How to contact the [facility] Attending or Consulting provider [place] or covering provider during after hours [place], for this patient?    1. Check the care team in [facility] and look for a) attending/consulting TR[facility] provider listed and b) the [facility] team listed 2. Log into [email or web address] and use [place]'s universal password to access. If you do not have the password, please contact the hospital operator. 3. Locate the [facility] provider you are looking for under [facility] and page to a number that you can be directly reached. 4. If you still have difficulty reaching the provider, please page the [facility] (Director on Call) for the  Hospitalists listed on amion for assistance.

## 2020-10-11 NOTE — Progress Notes (Signed)
[facility] SURGICAL PROGRESS NOTE (cpt [phone])  Hospital Day(s): 5.   Interval History: Patient seen and examined, no acute events or new complaints overnight. Patient reports response to enemas, denies nausea or vomiting.  Wants to resume oral intake and have NG tube removed.  Review of Systems:  Constitutional: denies fever, chills  Respiratory: denies any shortness of breath  Cardiovascular: denies chest pain or palpitations  Gastrointestinal: denies abdominal pain, N/V, Musculoskeletal: deniedecreased motor or sensation Integumentary: denies any other rashes or skin discolorations Neurological: denies HA  Vital signs in last 24 hours: [min-max] current  Temp:  [96.8 F (36 C)-98.7 F (37.1 C)] 96.8 F (36 C) (11/21 1155) Pulse Rate:  [69-96] 69 (11/21 1155) Resp:  [18-20] 18 (11/21 1155) BP: (108-143)/(52-81) 108/68 (11/21 1155) SpO2:  [95 %-99 %] 96 % (11/21 1155)     Height: 5\' 5"  (165.1 cm) Weight: 51.3 kg BMI (Calculated): 18.82   Intake/Output last 2 shifts:  11/20 0701 - 11/21 0700 In: 1302.3 [I.V.:1202.3; NG/GT:100] Out: 1150 [Urine:1150]   Physical Exam:  Constitutional: alert, cooperative and no distress  Eyes: Sclera are nonicteric Respiratory: breathing non-labored at rest  Cardiovascular: regular rate and sinus rhythm  Gastrointestinal: soft, non-tender, and less distended, readily reducible right groin hernia, remains soft and nontender. Musculoskeletal: no edema or wounds,  NT    Labs:  CBC Latest Ref Rng & Units 10/09/2020 10/08/2020 10/07/2020  WBC 4.0 - 10.5 K/uL 9.7 11.2(H) 10.7(H)  Hemoglobin 12.0 - 15.0 g/dL 11.5(L) 12.6 11.3(L)  Hematocrit 36 - 46 % 34.5(L) 38.2 34.3(L)  Platelets 150 - 400 K/uL 287 363 358   CMP Latest Ref Rng & Units 10/11/2020 10/10/2020 10/09/2020  Glucose 70 - 99 mg/dL 104(H) 100(H) 78  BUN 8 - 23 mg/dL 25(H) 25(H) 25(H)  Creatinine 0.44 - 1.00 mg/dL 1.32(H) 1.36(H) 1.37(H)  Sodium 135 - 145 mmol/L  133(L) 133(L) 134(L)  Potassium 3.5 - 5.1 mmol/L 3.2(L) 3.7 3.6  Chloride 98 - 111 mmol/L 99 98 99  CO2 22 - 32 mmol/L 24 27 26   Calcium 8.9 - 10.3 mg/dL 8.1(L) 8.3(L) 8.4(L)  Total Protein 6.5 - 8.1 g/dL - - -  Total Bilirubin 0.3 - 1.2 mg/dL - - -  Alkaline Phos 38 - 126 U/L - - -  AST 15 - 41 U/L - - -  ALT 0 - 44 U/L - - -    Imaging studies: No new pertinent imaging studies   Assessment/Plan: 79 y.o. female with fecal impaction with partial duodenal obstruction and generalized ileus, seemingly improving with enemas and bowel regimen.  She has a readily reducible right groin hernia that I do not believe this is playing a role in her prior obstruction/ileus, complicated by pertinent comorbidities including . Patient Active Problem List   Diagnosis Date Noted  . Abdominal pain 10/06/2020  . Hematemesis with nausea   . Upper GI bleed   . UTI (urinary tract infection) 09/27/2020  . Acute pyelonephritis 09/27/2020  . Unintentional weight loss 09/27/2020  . Debility 09/27/2020  . Atrial fibrillation ([place])   . Severe protein-calorie malnutrition ([place]) 09/03/2020  . 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 Chronic kidney disease (CKD), stage III (moderate) ([place]) 08/20/2019  . RBBB 05/09/2019  . Pressure injury of skin 01/15/2019  . COPD (chronic obstructive pulmonary disease) ([place]) 01/14/2019  . Hypertension 05/04/2018  . Osteoporosis 11/02/2017  . Coronary atherosclerosis 05/23/2016  . Personal history of tobacco use, presenting hazards to health 03/18/2016  . Idiopathic scoliosis 11/28/2014  .  Tobacco abuse 11/28/2014  . 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 Hydronephrosis 02/04/2014  . 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 Carotid stenosis 05/23/2013  . Pernicious anemia 06/26/2012  . Hyperlipidemia 12/29/2011  . 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 Hypothyroidism 08/04/2011     -Agree with removal of NG tube, and  initiation of clear liquid diet.  Would advance to full's as tolerated, but not sure I would advance beyond that.  -Continue bowel regimen, being proactive to avoid recurrent impaction.  -Have not much else to add, would not pursue elective hernia repair at this time.   -General surgery to sign out, I will be away for the next 2 weeks, please reconsult general surgery should additional issues arise.  -- Ronny Bacon M.D., [facility] 10/11/2020 2:53 PM

## 2020-10-12 ENCOUNTER — Inpatient Hospital Stay: Payer: Medicare HMO

## 2020-10-12 ENCOUNTER — Ambulatory Visit: Payer: Self-pay | Admitting: Licensed Clinical Social Worker

## 2020-10-12 DIAGNOSIS — I129 Hypertensive chronic kidney disease with stage 1 through stage 4 chronic kidney disease, or unspecified chronic kidney disease: Secondary | ICD-10-CM

## 2020-10-12 DIAGNOSIS — Q6211 Congenital occlusion of ureteropelvic junction: Secondary | ICD-10-CM | POA: Diagnosis not present

## 2020-10-12 DIAGNOSIS — N183 Chronic kidney disease, stage 3 unspecified: Secondary | ICD-10-CM

## 2020-10-12 DIAGNOSIS — E782 Mixed hyperlipidemia: Secondary | ICD-10-CM

## 2020-10-12 DIAGNOSIS — R1012 Left upper quadrant pain: Secondary | ICD-10-CM | POA: Diagnosis not present

## 2020-10-12 DIAGNOSIS — I10 Essential (primary) hypertension: Secondary | ICD-10-CM | POA: Diagnosis not present

## 2020-10-12 DIAGNOSIS — E43 Unspecified severe protein-calorie malnutrition: Secondary | ICD-10-CM | POA: Diagnosis not present

## 2020-10-12 DIAGNOSIS — J441 Chronic obstructive pulmonary disease with (acute) exacerbation: Secondary | ICD-10-CM

## 2020-10-12 LAB — HEPATIC FUNCTION PANEL
ALT: 7 U/L (ref 0–44)
AST: 13 U/L — ABNORMAL LOW (ref 15–41)
Albumin: 1.9 g/dL — ABNORMAL LOW (ref 3.5–5.0)
Alkaline Phosphatase: 35 U/L — ABNORMAL LOW (ref 38–126)
Bilirubin, Direct: <0.1 mg/dL (ref 0.0–0.2)
Total Bilirubin: 0.4 mg/dL (ref 0.3–1.2)
Total Protein: 5 g/dL — ABNORMAL LOW (ref 6.5–8.1)

## 2020-10-12 LAB — CBC
HCT: 28.6 % — ABNORMAL LOW (ref 36.0–46.0)
Hemoglobin: 9.3 g/dL — ABNORMAL LOW (ref 12.0–15.0)
MCH: 31.2 pg (ref 26.0–34.0)
MCHC: 32.5 g/dL (ref 30.0–36.0)
MCV: 96 fL (ref 80.0–100.0)
Platelets: 224 10*3/uL (ref 150–400)
RBC: 2.98 MIL/uL — ABNORMAL LOW (ref 3.87–5.11)
RDW: 13.9 % (ref 11.5–15.5)
WBC: 8.3 10*3/uL (ref 4.0–10.5)
nRBC: 0 % (ref 0.0–0.2)

## 2020-10-12 LAB — BASIC METABOLIC PANEL
Anion gap: 9 (ref 5–15)
BUN: 23 mg/dL (ref 8–23)
CO2: 21 mmol/L — ABNORMAL LOW (ref 22–32)
Calcium: 8 mg/dL — ABNORMAL LOW (ref 8.9–10.3)
Chloride: 104 mmol/L (ref 98–111)
Creatinine, Ser: 1.23 mg/dL — ABNORMAL HIGH (ref 0.44–1.00)
GFR, Estimated: 44 mL/min — ABNORMAL LOW (ref >60)
Glucose, Bld: 106 mg/dL — ABNORMAL HIGH (ref 70–99)
Potassium: 3.7 mmol/L (ref 3.5–5.1)
Sodium: 134 mmol/L — ABNORMAL LOW (ref 135–145)

## 2020-10-12 LAB — GLUCOSE, CAPILLARY
Glucose-Capillary: 103 mg/dL — ABNORMAL HIGH (ref 70–99)
Glucose-Capillary: 106 mg/dL — ABNORMAL HIGH (ref 70–99)
Glucose-Capillary: 99 mg/dL (ref 70–99)
Glucose-Capillary: 105 mg/dL — ABNORMAL HIGH (ref 70–99)
Glucose-Capillary: 106 mg/dL — ABNORMAL HIGH (ref 70–99)

## 2020-10-12 LAB — PHOSPHORUS: Phosphorus: 2.5 mg/dL (ref 2.5–4.6)

## 2020-10-12 LAB — MAGNESIUM: Magnesium: 1.7 mg/dL (ref 1.7–2.4)

## 2020-10-12 MED ORDER — TRAVASOL 10 % IV SOLN
INTRAVENOUS | Status: AC
  Filled 2020-10-12: qty 554.4

## 2020-10-12 MED ORDER — POLYETHYLENE GLYCOL 3350 17 G PO PACK
17.0000 g | PACK | Freq: Every day | ORAL | Status: DC
  Administered 2020-10-12 – 2020-10-16 (×4): 17 g via ORAL
  Filled 2020-10-12 (×4): qty 1

## 2020-10-12 MED ORDER — MAGNESIUM SULFATE 2 GM/50ML IV SOLN
2.0000 g | Freq: Once | INTRAVENOUS | Status: AC
  Administered 2020-10-12: 10:00:00 2 g via INTRAVENOUS
  Filled 2020-10-12: qty 50

## 2020-10-12 NOTE — Consult Note (Addendum)
PHARMACY - TOTAL PARENTERAL NUTRITION CONSULT NOTE   Indication: concern for possible obstruction related to extrinsic compression of duodenum and inability to assess patient's tolerance of PO diet due to poor intake  Patient Measurements: Height: 5\' 5"  (165.1 cm) Weight: 51.3 kg (113 lb 1.5 oz) IBW/kg (Calculated) : 57 TPN AdjBW (KG): 51.3 Body mass index is 18.82 kg/m. Usual Weight: 51.5 kg  Assessment: Due to concern for possible obstruction related to extrinsic compression of duodenum and inability to assess patient's tolerance of PO diet due to poor intake, plan is for initiation of TPN. Patient has still not had any flatus or BMs. Glucose / Insulin: BG 112-136 1 unit insulin given. A1c 5.9  Electrolytes:  Na 133, K+ 3.7 > 3.2>3.7, Phos 2.2, Mg 2.1>1.7 Renal: Scr 1.36 > 1.32>1.23 LFTs / TGs: WNL. Prealbumin / albumin: 2.1 Intake / Output; MIVF: NS @ 20, TPN @ 62ml/hr, I/O net -138 11/21 GI Imaging: none,  Surgeries / Procedures: 11/8 cystopscopy with stent placement  Central access: 11/20 TPN start date: 11/20  Nutritional Goals (per RD recommendation on 11/19): kCal: 1400-1600, Protein: 75-85, Fluid: 1.3-1.5 L/day Goal TPN rate is 62 mL/hr (provides 81.84 g of protein and 1532.24 kcals per day)  Current Nutrition:  TPN  Plan:   ContinueTPN @ 42 mL/hr at 1800 (2/3 goal rate - not increasing to full rate per resumption of diet) Electrolytes in TPN: 49mEq/L of Na, 4mEq/L of K, 60mEq/L of Ca, 75mEq/L of Mg, and 66mmol/L of Phos. Cl:Ac ratio 1:1 No additional K+ replenishment warranted Will replete Mg with 2g IV bolus x 1 (recheck lab tomorrow am) Add standard MVI and trace elements to TPN Initiate Sensitive q8h SSI and adjust as needed  Continue MIVF at 20 mL/hr at 1800 Monitor TPN labs on Mon/Thurs. CBC, Albumin, Phos, Mg, CMP.   Cathy Solis, PharmD, BCPS 10/12/2020,9:33 AM

## 2020-10-12 NOTE — Care Management Important Message (Signed)
Important Message  Patient Details  Name: Cathy Solis MRN: [id number] Date of Birth: Dec 20, 1940   Medicare Important Message Given:  Yes     [name] 10/12/2020, 10:33 AM

## 2020-10-12 NOTE — Progress Notes (Signed)
PROGRESS NOTE    Cathy Solis   VOH:[id number]  DOB: Jul 01, 1941  PCP: Valerie Roys, DO    DOA: 10/06/2020 LOS: 6   Brief Narrative   HPI on admission, by Dr. Posey Pronto: "Cathy Solis is a 79 y.o. 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  Patient was recently hospitalized on 09/27/2020 and discharged on 10/05/2020.  Patient during recent admission was admitted for pyelonephritis, and discharged with antibiotics and outpatient follow-up with urologist, cardiologist and primary care provider.  She was found to have acute Pilo and AKI with culture showing E. coli and discharged on p.o. Levaquin.  Patient was also seen by cardiologist for A. fib RVR and started on Eliquis for CHADS2 score of 6. Prior to previous admission patient was not on home O2 was discharged on 2 L by nasal cannula.  Sister [name] was involved in patient's care and her son [name]."     Assessment & Plan   Principal Problem:   Abdominal pain Active Problems:   Hypothyroidism   Hydronephrosis   Tobacco abuse   Hypertension   Severe protein-calorie malnutrition (HCC)   Atrial fibrillation (HCC)  Abdominal Pain - multifactorial due to duodenal compression vs SBO due to hernia vs stool impaction vs ?PUD / esophagitis.  Imaging on admission concerning for extrinsic compression of duodenum by severe renal pelvis dilation and SMA, markedly distended stomach consistent with this as well.  NG tube was placed with some improvement when it was on suction.   11/20 Distention and pain worse with NG off suction and laxatives given per NG.   11/21 Much improvement once bowel regimen  finally took effect. --Consults: GI, urology, general surgery --Discontinue NG today 11/21 --Currently on soft diet, seems tolerating but little more distention today --IV Protonix BID --Bowel regimen, as below - backed off on 11/21 after several type 7 stools and distention resolved --Monitor abdominal exam closely --Continue TPN for now until adequate oral intake  Acute kidney injury vs CKD - POA.  Hold lisinopril for now (BP soft as well).  Monitor BMP.    Acute vs Recurrent Pyelonephritis - recently admitted for this, as above.  Continue IV Rocephin.  Urine culture negative this admission.  Urology recommends 2 weeks of antibiotics.   --11/21: small amount gross hematuria noted in Foley bag  Left hydronephrosis - urology consulted.  Chronic issue that appears stable on imaging from prior.  Status post Left ureteral stent placement with Dr. Diamantina Providence on 11/18.   Continue IV antibiotics as above. Per urology, d/c Foley today (11/22). Urology anticipates long-term stent exchanges every 6-12 months.   Follow up in urology clinic in 1 month.  Constipation - d/c scheduled MiraLAX and enemas for now, will give daily PRN if no BM in 24 hours.  Also has PRN Dulcolax PO or suppositories.  Oral thrush - magic mouthwash with lidocaine A-fib - rate controlled.  Continue Eliquis, metoprolol.  Telemetry monitoring.  Essential hypertension - lisinopril is held due to AKI on admission and BP soft without it.  Continue metoprolol.  PRN hydralazine IV.  Hypothyroidism - continue levothyroxine.  TSH normal.  Severe protein calorie malnutrition - dietician following.  Oral nutrition supplements per orders.  Follow daily K, Mg, Phos due to high risk for refeeding syndrome.  Body mass index is 18.82 kg/m.  --PICC line  --On short term TPN - continue until adequate PO --Dietician following  Generalized weakness - discharged to [facility] for short term rehab after prior admission.  OT recommends  SNF.  PT evaluation pending.  Hx of tobacco abuse - nicotine patch discontinued at patient request.  She does smoke 1-2 ppd, but declines NRT.   DVT prophylaxis: apixaban (ELIQUIS) tablet 2.5 mg Start: 10/08/20 2200 SCDs Start: 10/06/20 1336.  On Eliquis   Diet:  Diet Orders (From admission, onward)    Start     Ordered   10/11/20 0916  DIET SOFT Room service appropriate? Yes; Fluid consistency: Thin  Diet effective now       Question Answer Comment  Room service appropriate? Yes   Fluid consistency: Thin      10/11/20 0915            Code Status: Full Code    Subjective 10/12/20    Pt seen with sister at bedside today. Pt unable to tell me her symptoms or what she's had to eat.  Sister says she had some scrambled eggs for breakfast, ate them all and has not complained.  Patient currently denies pain or nausea.  She doesn't know if passing any gas and did not remember having several liquid BM's after aggressive bowel regimen.     Disposition Plan & Communication   Status is: Inpatient  Remains inpatient appropriate because:Inpatient level of care appropriate due to severity of illness, inadequate oral intake on TPN, on IV antibiotics.  Advancing diet slowly.   Dispo: The patient is from: Home              Anticipated d/c is to: Home              Anticipated d/c date is: 2-3 days              Patient currently is not medically stable to d/c.   Family Communication: sister at bedside on rounds today.     Consults, Procedures, Significant Events   Consultants:   Gastroenterology  Urology  General surgery  Procedures:   NG tube placed / discontinued on 11/21  Ureteral stent placement 11/18  Antimicrobials:  Anti-infectives (From admission, onward)   Start     Dose/Rate Route Frequency Ordered Stop   10/07/20 1530  cefTRIAXone (ROCEPHIN) 1 g in sodium chloride 0.9 % 100 mL IVPB        1 g 200 mL/hr over 30 Minutes Intravenous Every 24 hours 10/07/20 1435           Objective   Vitals:   10/12/20 0023 10/12/20 0404 10/12/20 0733 10/12/20 1235  BP: 108/62 127/67 112/61 (!) 116/56  Pulse: 70 (!) 58 62 68  Resp: 18 18 14 18   Temp: 98 F (36.7 C) 98.7 F (37.1 C) 98.1 F (36.7 C) 99 F (37.2 C)  TempSrc: Oral Oral Oral   SpO2: 99% 100% 97% 96%  Weight:      Height:        Intake/Output Summary (Last 24 hours) at 10/12/2020 1247 Last data filed at 10/12/2020 8119 Gross per 24 hour  Intake 661.93 ml  Output 800 ml  Net -138.07 ml   Filed Weights   10/06/20 0822  Weight: 51.3 kg    Physical Exam:  General exam: awake, alert, no acute distress, cachectic HEENT: NG  tube is out, moist mucus membranes, wearing nasal cannula Respiratory system: clear bilaterally, no wheezing, normal respiratory effort, [place] oxygen. Cardiovascular system: normal S1/S2, RRR, no pedal edema.   Gastrointestinal system: soft today, little more distended today, non-tender, appreciate left sided bowel sounds but almost none on  right Genitourinary: yellow urine in Foley bag without gross hematuria   Labs   Data Reviewed: I have personally reviewed following labs and imaging studies  CBC: Recent Labs  Lab 10/06/20 0828 10/07/20 0342 10/08/20 0531 10/09/20 0354 10/12/20 0507  WBC 14.7* 10.7* 11.2* 9.7 8.3  NEUTROABS 12.1*  --   --   --   --   HGB 12.4 11.3* 12.6 11.5* 9.3*  HCT 37.2 34.3* 38.2 34.5* 28.6*  MCV 92.3 92.2 92.3 91.3 96.0  PLT 415* 358 363 287 161   Basic Metabolic Panel: Recent Labs  Lab 10/08/20 0531 10/09/20 0354 10/10/20 0423 10/11/20 0625 10/12/20 0559  NA 133* 134* 133* 133* 134*  K 3.8 3.6 3.7 3.2* 3.7  CL 98 99 98 99 104  CO2 25 26 27 24  21*  GLUCOSE 70 78 100* 104* 106*  BUN 24* 25* 25* 25* 23  CREATININE 1.29* 1.37* 1.36* 1.32* 1.23*  CALCIUM 8.3* 8.4* 8.3* 8.1* 8.0*  MG 1.7 1.7 2.1 1.9 1.7  PHOS 2.8 2.8 2.2* 2.5 2.5   GFR: Estimated Creatinine Clearance: 29.1 mL/min (A) (by C-G formula based on SCr of  1.23 mg/dL (H)). Liver Function Tests: Recent Labs  Lab 10/06/20 0828 10/07/20 0342 10/12/20 0559  AST 13* 12* 13*  ALT 10 9 7   ALKPHOS 47 40 35*  BILITOT 0.5 0.5 0.4  PROT 6.3* 5.6* 5.0*  ALBUMIN 2.4* 2.1* 1.9*   Recent Labs  Lab 10/06/20 0828  LIPASE 45   No results for input(s): AMMONIA in the last 168 hours. Coagulation Profile: Recent Labs  Lab 10/06/20 0838  INR 1.1   Cardiac Enzymes: No results for input(s): CKTOTAL, CKMB, CKMBINDEX, TROPONINI in the last 168 hours. BNP (last 3 results) No results for input(s): PROBNP in the last 8760 hours. HbA1C: Recent Labs    10/10/20 0423  HGBA1C 5.9*   CBG: Recent Labs  Lab 10/11/20 1613 10/11/20 2230 10/12/20 0605 10/12/20 0804 10/12/20 1235  GLUCAP 136* 121* 103* 106* 99   Lipid Profile: Recent Labs    10/11/20 0625  TRIG 124   Thyroid Function Tests: No results for input(s): TSH, T4TOTAL, FREET4, T3FREE, THYROIDAB in the last 72 hours. Anemia Panel: No results for input(s): VITAMINB12, FOLATE, FERRITIN, TIBC, IRON, RETICCTPCT in the last 72 hours. Sepsis Labs: Recent Labs  Lab 10/06/20 0960  LATICACIDVEN 1.0    Recent Results (from the past 240 hour(s))  SARS Coronavirus 2 by RT PCR (hospital order, performed in [facility] hospital lab) Nasopharyngeal Nasopharyngeal Swab     Status: None   Collection Time: 10/05/20  1:02 PM   Specimen: Nasopharyngeal Swab  Result Value Ref Range Status   SARS Coronavirus 2 NEGATIVE NEGATIVE Final    Comment: (NOTE) SARS-CoV-2 target nucleic acids are NOT DETECTED.  The SARS-CoV-2 RNA is generally detectable in upper and lower respiratory specimens during the acute phase of infection. The lowest concentration of SARS-CoV-2 viral copies this assay can detect is 250 copies / mL. A negative result does not preclude SARS-CoV-2 infection and should not be used as the sole basis for treatment or other patient management decisions.  A negative result may occur  with improper specimen collection / handling, submission  of specimen other than nasopharyngeal swab, presence of viral mutation(s) within the areas targeted by this assay, and inadequate number of viral copies (<250 copies / mL). A negative result must be combined with clinical observations, patient history, and epidemiological information.  Fact Sheet for Patients:   StrictlyIdeas.no  Fact Sheet for Healthcare Providers: BankingDealers.co.za  This test is not yet approved or  cleared by the [place] FDA and has been authorized for detection and/or diagnosis of SARS-CoV-2 by FDA under an Emergency Use Authorization (EUA).  This EUA will remain in effect (meaning this test can be used) for the duration of the COVID-19 declaration under Section 564(b)(1) of the Act, 21 U.S.C. section 360bbb-3(b)(1), unless the authorization is terminated or revoked sooner.  Performed at [facility], [address] [place], [place] [address]   Resp Panel by RT PCR (RSV, Flu A&B, Covid) - Urine, Clean Catch     Status: None   Collection Time: 10/06/20  1:50 PM   Specimen: Urine, Clean Catch  Result Value Ref Range Status   SARS Coronavirus 2 by RT PCR NEGATIVE NEGATIVE Final    Comment: (NOTE) SARS-CoV-2 target nucleic acids are NOT DETECTED.  The SARS-CoV-2 RNA is generally detectable in upper respiratoy specimens during the acute phase of infection. The lowest concentration of SARS-CoV-2 viral copies this assay can detect is 131 copies/mL. A negative result does not preclude SARS-Cov-2 infection and should not be used as the sole basis for treatment or other patient management decisions. A negative result may occur with  improper specimen collection/handling, submission of specimen other than nasopharyngeal swab, presence of viral mutation(s) within the areas targeted by this assay, and inadequate number of viral copies (<131  copies/mL). A negative result must be combined with clinical observations, patient history, and epidemiological information. The expected result is Negative.  Fact Sheet for Patients:  PinkCheek.be  Fact Sheet for Healthcare Providers:  GravelBags.it  This test is no t yet approved or cleared by the [place] FDA and  has been authorized for detection and/or diagnosis of SARS-CoV-2 by FDA under an Emergency Use Authorization (EUA). This EUA will remain  in effect (meaning this test can be used) for the duration of the COVID-19 declaration under Section 564(b)(1) of the Act, 21 U.S.C. section 360bbb-3(b)(1), unless the authorization is terminated or revoked sooner.     Influenza A by PCR NEGATIVE NEGATIVE Final   Influenza B by PCR NEGATIVE NEGATIVE Final    Comment: (NOTE) The Xpert Xpress SARS-CoV-2/FLU/RSV assay is intended as an aid in  the diagnosis of influenza from Nasopharyngeal swab specimens and  should not be used as a sole basis for treatment. Nasal washings and  aspirates are unacceptable for Xpert Xpress SARS-CoV-2/FLU/RSV  testing.  Fact Sheet for Patients: PinkCheek.be  Fact Sheet for Healthcare Providers: GravelBags.it  This test is not yet approved or cleared by the [place] FDA and  has been authorized for detection and/or diagnosis of SARS-CoV-2 by  FDA under an Emergency Use Authorization (EUA). This EUA will remain  in effect (meaning this test can be used) for the duration of the  Covid-19 declaration under Section 564(b)(1) of the Act, 21  U.S.C. section 360bbb-3(b)(1), unless the authorization is  terminated or revoked.    Respiratory Syncytial Virus by PCR NEGATIVE NEGATIVE Final    Comment: (NOTE) Fact Sheet for Patients: PinkCheek.be  Fact Sheet for Healthcare  Providers: GravelBags.it  This test is not yet approved or cleared by the [place]  States FDA and  has been authorized for detection and/or diagnosis of SARS-CoV-2 by  FDA under an Emergency Use Authorization (EUA). This EUA will remain  in effect (meaning this test can be used) for the duration of the  COVID-19 declaration under Section 564(b)(1) of the Act, 21 U.S.C.  section 360bbb-3(b)(1), unless the authorization is terminated or  revoked. Performed at [facility], [address], [place], [place] [address]   Urine Culture     Status: None   Collection Time: 10/06/20  1:50 PM   Specimen: Urine, Random  Result Value Ref Range Status   Specimen Description   Final    URINE, RANDOM Performed at [facility], [address], [place], [place] [address]    Special Requests   Final    NONE Performed at [facility], [address] [place], [place] [address]    Culture   Final    NO GROWTH Performed at [facility], [place] [address], [place], [place] [address]    Report Status 10/08/2020 FINAL  Final      Imaging Studies   No results found.   Medications   Scheduled Meds: . apixaban  2.5 mg Oral BID  . Chlorhexidine Gluconate Cloth  6 each Topical Daily  . feeding supplement  237 mL Oral TID BM  . fluticasone furoate-vilanterol  1 puff Inhalation Daily  . insulin aspart  0-9 Units Subcutaneous Q8H  . ipratropium-albuterol  3 mL Nebulization BID  . levothyroxine  150 mcg Per Tube Q0600  . magic mouthwash  5 mL Oral QID   And  . lidocaine  5 mL Mouth/Throat QID  . metoprolol tartrate  50 mg Per Tube BID  . ondansetron (ZOFRAN) IV  4 mg Intravenous Once  . pantoprazole (PROTONIX) IV  40 mg Intravenous Q12H  . sodium chloride flush  10-40 mL Intracatheter Q12H  . sucralfate  1 g Per Tube TID WC & HS   Continuous Infusions: . sodium chloride 20 mL/hr at 10/12/20 0815  . cefTRIAXone (ROCEPHIN)  IV Stopped  (10/11/20 1606)  . TPN ADULT (ION) 42 mL/hr at 10/12/20 0400  . TPN ADULT (ION)         LOS: 6 days    Time spent: 25 minutes with > 50% spent in coordination of care and direct patient contact    Ezekiel Slocumb, DO [facility]  10/12/2020, 12:47 PM    If 7PM-7AM, please contact night-coverage. How to contact the [facility] Attending or Consulting provider [place] or covering provider during after hours [place], for this patient?    1. Check the care team in [facility] and look for a) attending/consulting [facility] provider listed and b) the [facility] team listed 2. Log into [email or web address] and use [facility] universal password to access. If you do not have the password, please contact the hospital operator. 3. Locate the [facility] provider you are looking for under [facility] and page to a number that you can be directly reached. 4. If you still have difficulty reaching the provider, please page the [facility] (Director on Call) for the Hospitalists listed on amion for assistance.

## 2020-10-12 NOTE — Chronic Care Management (AMB) (Signed)
Chronic Care Management    Clinical Social Work Follow Up Note  10/12/2020 Name: Cathy Solis MRN: [id number] DOB: 1941-02-04  Cathy Solis is a 79 y.o. year old female who is a primary care patient of Valerie Roys, DO. The CCM team was consulted for assistance with Level of Care Concerns.   Review of patient status, including review of consultants reports, other relevant assessments, and collaboration with appropriate care team members and the patient's provider was performed as part of comprehensive patient evaluation and provision of chronic care management services.    SDOH (Social Determinants of Health) assessments performed: Yes    Facility-Administered Encounter Medications as of 10/12/2020  Medication  . 0.9 %  sodium chloride infusion  . acetaminophen (TYLENOL) tablet 650 mg  . albuterol (VENTOLIN HFA) 108 (90 Base) MCG/ACT inhaler 2 puff  . apixaban (ELIQUIS) tablet 2.5 mg  . bisacodyl (DULCOLAX) EC tablet 5 mg  . bisacodyl (DULCOLAX) suppository 10 mg  . cefTRIAXone (ROCEPHIN) 1 g in sodium chloride 0.9 % 100 mL IVPB  . Chlorhexidine Gluconate Cloth 2 % PADS 6 each  . feeding supplement (ENSURE ENLIVE / ENSURE PLUS) liquid 237 mL  . fluticasone furoate-vilanterol (BREO ELLIPTA) 200-25 MCG/INH 1 puff  . hydrALAZINE (APRESOLINE) injection 10 mg  . insulin aspart (novoLOG) injection 0-9 Units  . iohexol (OMNIPAQUE) 9 MG/ML oral solution 500 mL  . ipratropium-albuterol (DUONEB) 0.5-2.5 (3) MG/3ML nebulizer solution 3 mL  . levothyroxine (SYNTHROID) tablet 150 mcg  . magic mouthwash   And  . lidocaine (XYLOCAINE) 2 % viscous mouth solution 5 mL  . metoprolol tartrate (LOPRESSOR) tablet 50 mg  . ondansetron (ZOFRAN) injection 4 mg  . ondansetron (ZOFRAN) injection 4 mg  . pantoprazole (PROTONIX) injection 40 mg  . phenol (CHLORASEPTIC) mouth spray 1 spray  . polyethylene glycol (MIRALAX / GLYCOLAX) packet 17 g  . sodium chloride flush (NS) 0.9 % injection 10-40  mL  . sodium chloride flush (NS) 0.9 % injection 10-40 mL  . sucralfate (CARAFATE) 1 GM/10ML suspension 1 g  . TPN ADULT (ION)  . TPN ADULT (ION)  . [DISCONTINUED] polyethylene glycol (MIRALAX / GLYCOLAX) packet 17 g   Outpatient Encounter Medications as of 10/12/2020  Medication Sig Note  . acetaminophen (TYLENOL) 325 MG tablet Take 2 tablets (650 mg total) by mouth every 6 (six) hours as needed for mild pain (or Fever >/= 101).   [name] albuterol (VENTOLIN HFA) 108 (90 Base) MCG/ACT inhaler Inhale 2 puffs into the lungs every 6 (six) hours as needed for wheezing or shortness of breath.   [name] apixaban (ELIQUIS) 2.5 MG TABS tablet Take 1 tablet (2.5 mg total) by mouth 2 (two) times daily.   . feeding supplement (ENSURE ENLIVE / ENSURE PLUS) LIQD Take 237 mLs by mouth 2 (two) times daily between meals.   . fluticasone furoate-vilanterol (BREO ELLIPTA) 200-25 MCG/INH AEPB Inhale 1 puff into the lungs daily. 10/06/2020: New medication, not started yet  . ipratropium-albuterol (DUONEB) 0.5-2.5 (3) MG/3ML SOLN Take 3 mLs by nebulization 2 (two) times daily for 4 days. DuoNeb nebulizer treatments twice a day for 4 days followed by as needed for severe shortness of breath (Patient taking differently: Take 3 mLs by nebulization 2 (two) times daily. )   . levothyroxine (SYNTHROID) 150 MCG tablet Take 1 tablet (150 mcg total) by mouth daily.   [name] lisinopril (ZESTRIL) 10 MG tablet Take 1 tablet (10 mg total) by mouth daily. TAKE 1 TABLET BY MOUTH EVERY  DAY   . metoprolol tartrate (LOPRESSOR) 50 MG tablet Take 1 tablet (50 mg total) by mouth 2 (two) times daily.   . nicotine (NICODERM CQ - DOSED IN MG/24 HOURS) 21 mg/24hr patch Place 1 patch (21 mg total) onto the skin daily.      Patient Care Plan: General Social Work (Adult)    Problem Identified: Diminished Energy     Long-Range Goal: Energy Optimized   Start Date: 10/12/2020  Note:   Evidence-based guidance:  Screen for presence of fatigue using a  validated scale, quantitative (1 to 10) or word scale (mild, moderate, severe) at initial visit and at regular intervals.  Identify onset, pattern, duration, change over time, alleviating and contributing factors, as well as degree of interference with daily function.  Assess for and correlate reversible factors and comorbidities such as anxiety, depression, anemia, thyroid disease and sleep-disordered breathing; treat underlying disorders.  Screen for obstructive sleep apnea; prepare patient for polysomnography based on risk and presentation.   Prepare patient for use of long-term oxygen and noninvasive ventilation to relieve hypercapnia, hypoxemia, obstructive sleep apnea and reduce fatigue.   Suggest keeping a symptom diary or journal to detect pattern of fatigue.  Provide anticipatory guidance regarding precipitating factors including heat, over-exertion, stress, presence of treatment effects such as anemia, fever, infection and history or risk of falls.  Encourage physical activity and exercise such as aerobic exercise, yoga, balance and progressive resistance training; consider beginning with low-level activities and increase as conditioning and confidence improves.  Promote energy conservation techniques that include balancing work and rest, adjusting activities to save energy, asking for help, prioritizing work and activities, and using efficient body mechanics.  Encourage the use of adaptive and/or assistive equipment.  Prepare patient for the use of pharmacologic therapy; review efficacy, tolerability and adherence; manage medication-induced side effects.  Encourage the use of complementary or alternative therapy, alone or in combination, yoga, cognitive behavioral interventions, tai chi, cooling vest or cap.   Notes:    CARE PLAN ENTRY (see longitudinal plan of care for additional care plan information)  Current Barriers:  . Limited social support . Level of care concerns . Family and  relationship dysfunction (patient resist help from family in fear of losing her independence) . Social Isolation . Limited access to caregiver . Cognitive Deficits . Memory Deficits  Clinical Social Work Clinical Goal(s):   [name] Over the next 120 days, patient/caregiver will work with SW to address concerns related to care coordination needs, lack of a stable support network and lack of Brewing technologist. LCSW will assist patient in gaining additional support in order to maintain health and increase her level of support in the home as family is concerned for her well-being as she lives alone and has difficulty providing appropriate care to herself.  . Over the next 120 days, patient will demonstrate improved adherence to self care as evidenced by implementing healthy self-care into her daily routine such as: attending all medical appointments, deep breathing exercises, taking time for self-reflection, taking medications as prescribed, drinking water and daily exercise to improve mobility and mood. . Over the next 120 days, patient will demonstrate improved health management independence as evidenced by implementing healthy self-care skills and positive support/resources into her daily routine to help cope with stressors and improve overall health and well-being  . Over the next 120 days, patient or caregiver will verbalize basic understanding of depression/stress process and self health management plan as evidenced by her participation in development  of long term plan of care and institution of self health management strategies  Interventions: . Inter-disciplinary care team collaboration (see longitudinal plan of care) . Patient interviewed and appropriate assessments performed. LCSW spoke with patient's sister [name] and patient on 10/12/20. Patient is back in the hospital. . Patient was recently hospitalized on 09/27/2020 and discharged on 10/05/2020 to [facility] for short  term rehab but had to return back to the hospital for several health concerns. Patient initially went to the ED on 09/27/20 after having back pain and nausea for the past 3 days. Patient had been sitting in her recliner and had not been getting up much for the last 3 days even after family encouraged her to go to the hospital. Patient would not allow her sisters to stay with her either even though she was unable to care for herself. Patient has stage 3 kidney disease and arrived at ED with foul smelling urine. Patient only has one kidney. Patient has lost 13 pounds in the last 60 days. Per sister, patient has a kidney infection, kidney stones and urine infection. Son is her POA. Family are concerned that patient has limited support within the home and lives alone. Family report that "she often fights [place]. She didn't even want her son to come to town." Patient is often non-compliant and is fearful of LTC placement. Patient's son lives in [place] and makes several trips to see patient even though patient is weary about any family involvement. . Patient smokes heavily per sister.  . Family report that patient sometimes is forgetful and suspect dementia. Patient's cognition level changes per day per family.  . Per sister, son has offered to pay out of pocket for an aide but she declined wanting someone in her home.  [name] LCSW educated family on petitioning the court system for guardianship and provided contact number for [employer] [facility]) . Provided patient with information about level of care resources including: Museum/gallery curator, PACE, ALF, SNF for rehab or long term, Adult Day Centers, [place], Private pay caregiving and [facility] Family report that patient does not want to increase her level of care in fear of losing her independence.  . Discussed plans with patient for ongoing care management follow up and provided patient with direct contact information  for care management team . Advised patient's caregiver to contact CCM team for any case management needs  . Sister is concerned that patient continues to drive. Sister states that she even almost run over herself one day because of her inability to drive safely.  . Assisted patient/caregiver with obtaining information about health plan benefits . Provided education to patient/caregiver regarding level of care options.  Patient Self Care Activities:  . Attends all scheduled provider appointments . Lacks social connections . Has a difficult time asking for help sometimes      Task: Facilitate Activities to Optimize Energy for Daily Living   Note:   Care Management Activities:    {Facilitate Activities to Optimize Energy for Daily Living (Clinical)   Notes:   Long-Range Goal: Protect My Health   Start Date: 10/12/2020  Note:   Follow Up Date 90 days from 10/12/20   - schedule appointment for flu shot - schedule appointment for vaccines needed due to my age or health - schedule recommended health tests (blood work, mammogram, colonoscopy, pap test) - schedule and keep appointment for annual check-up    Why is this important?   Screening tests can  find diseases early when they are easier to treat.  Your doctor or nurse will talk with you about which tests are important for you.  Getting shots for common diseases like the flu and shingles will help prevent them.     Notes:      Follow Up Plan: SW will follow up with patient by phone over the next quarter  Eula Fried, BSW, MSW, [email or web address] Phone: [phone]

## 2020-10-12 NOTE — Progress Notes (Signed)
Urology Inpatient Progress Note  Subjective: No acute events overnight.  Resolving gastric outlet obstruction per GI, GI has signed off. WBC count down today, 8.3.  Creatinine stable, 1.32. Foley catheter in place draining clear, yellow urine. Patient denies any change or worsening in her baseline pain.  She reports she is hungry and would like to leave the hospital.  Anti-infectives: Anti-infectives (From admission, onward)   Start     Dose/Rate Route Frequency Ordered Stop   10/07/20 1530  cefTRIAXone (ROCEPHIN) 1 g in sodium chloride 0.9 % 100 mL IVPB        1 g 200 mL/hr over 30 Minutes Intravenous Every 24 hours 10/07/20 1435        Current Facility-Administered Medications  Medication Dose Route Frequency Provider Last Rate Last Admin  . 0.9 %  sodium chloride infusion   Intravenous Continuous Oswald Hillock, RPH 20 mL/hr at 10/12/20 0815 New Bag at 10/12/20 0815  . acetaminophen (TYLENOL) tablet 650 mg  650 mg Per Tube Q6H PRN Billey Co, MD   650 mg at 10/08/20 1736  . albuterol (VENTOLIN HFA) 108 (90 Base) MCG/ACT inhaler 2 puff  2 puff Inhalation Q6H PRN Billey Co, MD      . apixaban Arne Cleveland) tablet 2.5 mg  2.5 mg Oral BID Nicole Kindred A, DO   2.5 mg at 10/12/20 0818  . bisacodyl (DULCOLAX) EC tablet 5 mg  5 mg Oral Daily PRN Billey Co, MD   5 mg at 10/09/20 2051  . bisacodyl (DULCOLAX) suppository 10 mg  10 mg Rectal Daily PRN Billey Co, MD      . cefTRIAXone (ROCEPHIN) 1 g in sodium chloride 0.9 % 100 mL IVPB  1 g Intravenous Q24H Billey Co, MD   Stopped at 10/11/20 1606  . Chlorhexidine Gluconate Cloth 2 % PADS 6 each  6 each Topical Daily Fritzi Mandes, MD   6 each at 10/12/20 [phone]  . feeding supplement (ENSURE ENLIVE / ENSURE PLUS) liquid 237 mL  237 mL Oral TID BM Billey Co, MD   237 mL at 10/11/20 1000  . fluticasone furoate-vilanterol (BREO ELLIPTA) 200-25 MCG/INH 1 puff  1 puff Inhalation Daily Billey Co, MD   1 puff at  10/12/20 0623  . hydrALAZINE (APRESOLINE) injection 10 mg  10 mg Intravenous Q6H PRN Billey Co, MD      . insulin aspart (novoLOG) injection 0-9 Units  0-9 Units Subcutaneous Q8H Oswald Hillock, [place]   1 Units at 10/11/20 2233  . iohexol (OMNIPAQUE) 9 MG/ML oral solution 500 mL  500 mL Oral BID PRN Billey Co, MD   500 mL at 10/06/20 0923  . ipratropium-albuterol (DUONEB) 0.5-2.5 (3) MG/3ML nebulizer solution 3 mL  3 mL Nebulization BID Billey Co, MD   3 mL at 10/12/20 0757  . levothyroxine (SYNTHROID) tablet 150 mcg  150 mcg Per Tube [id number] Billey Co, MD   150 mcg at 10/12/20 0457  . magic mouthwash  5 mL Oral QID Nicole Kindred A, DO   5 mL at 10/12/20 0816   And  . lidocaine (XYLOCAINE) 2 % viscous mouth solution 5 mL  5 mL Mouth/Throat QID Nicole Kindred A, DO   5 mL at 10/11/20 2204  . metoprolol tartrate (LOPRESSOR) tablet 50 mg  50 mg Per Tube BID Billey Co, MD   50 mg at 10/12/20 0817  . ondansetron (ZOFRAN) injection 4 mg  4 mg  Intravenous Once Billey Co, MD      . ondansetron [facility]) injection 4 mg  4 mg Intravenous Q6H PRN Billey Co, MD   4 mg at 10/09/20 1802  . pantoprazole (PROTONIX) injection 40 mg  40 mg Intravenous Q12H Billey Co, MD   40 mg at 10/12/20 9169  . phenol (CHLORASEPTIC) mouth spray 1 spray  1 spray Mouth/Throat PRN Nicole Kindred A, DO   1 spray at 10/08/20 1737  . polyethylene glycol (MIRALAX / GLYCOLAX) packet 17 g  17 g Oral Daily PRN Nicole Kindred A, DO      . sodium chloride flush (NS) 0.9 % injection 10-40 mL  10-40 mL Intracatheter Q12H Nicole Kindred A, DO   10 mL at 10/12/20 0818  . sodium chloride flush (NS) 0.9 % injection 10-40 mL  10-40 mL Intracatheter PRN Nicole Kindred A, DO      . sucralfate (CARAFATE) 1 GM/10ML suspension 1 g  1 g Per Tube TID WC & HS Billey Co, MD   1 g at 10/12/20 0818  . TPN ADULT (ION)   Intravenous Continuous TPN Oswald Hillock, RPH 42 mL/hr at 10/12/20 0400  Rate Verify at 10/12/20 0400    Objective: Vital signs in last 24 hours: Temp:  [96.8 F (36 C)-98.7 F (37.1 C)] 98.1 F (36.7 C) (11/22 0733) Pulse Rate:  [53-70] 62 (11/22 0733) Resp:  [14-20] 14 (11/22 0733) BP: (108-127)/(58-68) 112/61 (11/22 0733) SpO2:  [95 %-100 %] 97 % (11/22 0733)  Intake/Output from previous day: 11/21 0701 - 11/22 0700 In: 661.9 [I.V.:371.4; IV Piggyback:290.5] Out: 800 [Urine:800] Intake/Output this shift: No intake/output data recorded.  Physical Exam Vitals and nursing note reviewed.  Constitutional:      General: She is not in acute distress.    Appearance: She is not ill-appearing, toxic-appearing or diaphoretic.  HENT:     Head: Normocephalic and atraumatic.  Pulmonary:     Effort: Pulmonary effort is normal. No respiratory distress.  Skin:    General: Skin is warm and dry.  Neurological:     Mental Status: She is alert and oriented to person, place, and time.  Psychiatric:        Mood and Affect: Mood normal.        Behavior: Behavior normal.    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 Diamantina Providence.  Duodenal compression resolving.  She continues to appear to tolerate the stent well.  Urology to sign off at this time, okay for discharge from urologic perspective.  Recommendations: -Discontinue Foley catheter -Continue empiric antibiotics  for total of 2 weeks of therapy -Outpatient follow-up in 1 month with Dr. Diamantina Providence with renal ultrasound prior  Debroah Loop, PA-C 10/12/2020

## 2020-10-12 NOTE — TOC Progression Note (Signed)
Transition of Care [facility]) - Progression Note    Patient Details  Name: Cathy Solis MRN: [id number] Date of Birth: [birth year]  [facility] CM/SW Contact  Shelbie Hutching, RN Phone Number: 10/12/2020, 12:36 PM  Clinical Narrative:     Patient's NG tube was removed yesterday and diet started.  GI still following.  Plan is still for patient to return to [facility] when medically stable for short term rehab.  TOC continues to follow.   Expected Discharge Plan: [place] Barriers to Discharge: Continued Medical Work up  Expected Discharge Plan and Services Expected Discharge Plan: [place]   Discharge Planning Services: CM Consult Post Acute Care Choice: [place] Living arrangements for the past 2 months: Single Family Home                   DME Agency: NA       HH Arranged: NA           Social Determinants of Health (SDOH) Interventions    Readmission Risk Interventions No flowsheet data found.

## 2020-10-13 ENCOUNTER — Inpatient Hospital Stay: Payer: Medicare HMO

## 2020-10-13 ENCOUNTER — Encounter: Payer: Self-pay | Admitting: Internal Medicine

## 2020-10-13 DIAGNOSIS — K315 Obstruction of duodenum: Secondary | ICD-10-CM | POA: Diagnosis not present

## 2020-10-13 DIAGNOSIS — E43 Unspecified severe protein-calorie malnutrition: Secondary | ICD-10-CM | POA: Diagnosis not present

## 2020-10-13 DIAGNOSIS — R140 Abdominal distension (gaseous): Secondary | ICD-10-CM

## 2020-10-13 LAB — BASIC METABOLIC PANEL
Anion gap: 8 (ref 5–15)
BUN: 26 mg/dL — ABNORMAL HIGH (ref 8–23)
CO2: 22 mmol/L (ref 22–32)
Calcium: 8 mg/dL — ABNORMAL LOW (ref 8.9–10.3)
Chloride: 103 mmol/L (ref 98–111)
Creatinine, Ser: 1.1 mg/dL — ABNORMAL HIGH (ref 0.44–1.00)
GFR, Estimated: 50 mL/min — ABNORMAL LOW (ref >60)
Glucose, Bld: 99 mg/dL (ref 70–99)
Potassium: 3.8 mmol/L (ref 3.5–5.1)
Sodium: 133 mmol/L — ABNORMAL LOW (ref 135–145)

## 2020-10-13 LAB — GLUCOSE, CAPILLARY
Glucose-Capillary: 100 mg/dL — ABNORMAL HIGH (ref 70–99)
Glucose-Capillary: 106 mg/dL — ABNORMAL HIGH (ref 70–99)
Glucose-Capillary: 117 mg/dL — ABNORMAL HIGH (ref 70–99)
Glucose-Capillary: 91 mg/dL (ref 70–99)

## 2020-10-13 LAB — MAGNESIUM: Magnesium: 2.1 mg/dL (ref 1.7–2.4)

## 2020-10-13 MED ORDER — IOHEXOL 300 MG/ML  SOLN
75.0000 mL | Freq: Once | INTRAMUSCULAR | Status: AC | PRN
  Administered 2020-10-13: 17:00:00 75 mL via INTRAVENOUS

## 2020-10-13 MED ORDER — TRAVASOL 10 % IV SOLN
INTRAVENOUS | Status: AC
  Filled 2020-10-13: qty 818.4

## 2020-10-13 MED ORDER — IPRATROPIUM-ALBUTEROL 0.5-2.5 (3) MG/3ML IN SOLN: 3.0000 mL | RESPIRATORY_TRACT | Status: DC | PRN

## 2020-10-13 NOTE — Progress Notes (Signed)
OT Cancellation Note  Patient Details Name: Cathy Solis MRN: [id number] DOB: 07-21-1941   Cancelled Treatment:    Reason Eval/Treat Not Completed: Patient declined, no reason specified;Other (comment)  Pt declines working with therapy at this time despite MAX encouragement to participate on some level, whether it be simple bed-level self care ADLs or mobilizing/exercising. Pt clearly states she does not want therapy and wants to be left alone. Pt's family member present in room and reports pt seems to be regressing. Will continue to follow as appropriate, but may need to re-assess pt's goals for her care in regards to therapy/rehabilitation.  Gerrianne Scale, [place], OTR/L ascom [phone] 10/13/20, 11:00 AM

## 2020-10-13 NOTE — Progress Notes (Signed)
Physical Therapy Treatment Patient Details Name: Cathy Solis MRN: [id number] DOB: [birth year] Today's Date: 10/13/2020    History of Present Illness Cathy Solis is an 43yoF who comes to [facility] on 11/16 from [facility] (recently DC from here to there <12 hours prior). Pt brought in with ABD pain, N/V. PMH: AF on eliquis, upper GIB. Pt recently in hospital for UTI. Pt underwent Left UPJ stenting. on 11/18.    PT Comments    Pt looking tired and reports feeling weak, but overall she was eager to do a little with PT and ultimately managed to ambulte ~35 ft on room air (highly reliant on walker, stooped/scoliotic per baseline).  Her O2 was high/mid 90s on 2L, stayed in the 90s on room air with bed activity, however stayed >90% almost the entirety of in-room ambulation, no LOB with some fatigue and c/o baseline pain aggravation.   Follow Up Recommendations  SNF;Supervision for mobility/OOB;Supervision/Assistance - 24 hour     Equipment Recommendations  None recommended by PT    Recommendations for Other Services       Precautions / Restrictions Precautions Precautions: Fall Restrictions Weight Bearing Restrictions: No    Mobility  Bed Mobility Overal bed mobility: Needs Assistance Bed Mobility: Supine to Sit;Sit to Supine     Supine to sit: Supervision     General bed mobility comments: weak and labored effort, but not needing assist to rise to standing  Transfers Overall transfer level: Needs assistance Equipment used: Rolling walker (2 wheeled) Transfers: Sit to/from Stand Sit to Stand: Min assist         General transfer comment: Pt showed great effort with all mobility tasks, needed extra effort setting up for transition to sitting, heavy cuing for U&LE use/position  Ambulation/Gait Ambulation/Gait assistance: Min assist Gait Distance (Feet): 35 Feet Assistive device: 1 person hand held assist       General Gait Details: Pt was able to manage slow, but  steady ambulation to/from door.  She was able to maintain low 90s O2 on room air t/o majority of the effort of ambulation   Stairs             Wheelchair Mobility    Modified Rankin (Stroke Patients Only)       Balance Overall balance assessment: Needs assistance Sitting-balance support: Feet supported;Single extremity supported Sitting balance-Leahy Scale: Good Sitting balance - Comments: leaning forward, but able to maintain balance                                    Cognition Arousal/Alertness: Awake/alert Behavior During Therapy: WFL for tasks assessed/performed Overall Cognitive Status: Within Functional Limits for tasks assessed                                        Exercises General Exercises - Lower Extremity Ankle Circles/Pumps: AROM;10 reps Long Arc Quad: Strengthening;10 reps Heel Slides: Strengthening;10 reps Hip ABduction/ADduction: Strengthening;10 reps Hip Flexion/Marching: Strengthening;10 reps    General Comments        Pertinent Vitals/Pain Pain Location: back, b/l LE chronic pain    Home Living                      Prior Function            PT Goals (current  goals can now be found in the care plan section) Progress towards PT goals: Progressing toward goals    Frequency    Min 2X/week      PT Plan Current plan remains appropriate    Co-evaluation              AM-PAC PT "6 Clicks" Mobility   Outcome Measure  Help needed turning from your back to your side while in a flat bed without using bedrails?: A Little Help needed moving from lying on your back to sitting on the side of a flat bed without using bedrails?: A Little Help needed moving to and from a bed to a chair (including a wheelchair)?: A Lot Help needed standing up from a chair using your arms (e.g., wheelchair or bedside chair)?: A Lot Help needed to walk in hospital room?: A Lot Help needed climbing 3-5 steps with a  railing? : Total 6 Click Score: 13    End of Session Equipment Utilized During Treatment: Oxygen Activity Tolerance: Patient limited by pain;Patient limited by fatigue Patient left: with call bell/phone within reach;in bed;with family/visitor present;with bed alarm set;Other (comment) Nurse Communication: Mobility status PT Visit Diagnosis: Muscle weakness (generalized) (M62.81);Difficulty in walking, not elsewhere classified (R26.2);Unsteadiness on feet (R26.81)     Time: 1030-1314 PT Time Calculation (min) (ACUTE ONLY): 34 min  Charges:  $Gait Training: 8-22 mins $Therapeutic Exercise: 8-22 mins                     Kreg Shropshire, DPT 10/13/2020, 3:07 PM

## 2020-10-13 NOTE — Consult Note (Signed)
PHARMACY - TOTAL PARENTERAL NUTRITION CONSULT NOTE   Indication: concern for possible obstruction related to extrinsic compression of duodenum and inability to assess patient's tolerance of PO diet due to poor intake  Patient Measurements: Height: 5\' 5"  (165.1 cm) Weight: 51.3 kg (113 lb 1.5 oz) IBW/kg (Calculated) : 57 TPN AdjBW (KG): 51.3 Body mass index is 18.82 kg/m. Usual Weight: 51.5 kg  Assessment: Due to concern for possible obstruction related to extrinsic compression of duodenum and inability to assess patient's tolerance of PO diet due to poor intake, plan is for continuation of TPN.   Patient has still not had any flatus or BMs.  Glucose / Insulin: BG 99-106, 0 units insulin given. A1c 5.9  Electrolytes:  Na 133, K+ 3.8, Phos 2.2, Mg 2.1 Renal: Scr 1.36 > 1.32>1.23>1.1 LFTs / TGs: WNL. Prealbumin / albumin: 2.1 Intake / Output; MIVF: NS @ 20, TPN @ 65ml/hr, I/O net +1591 11/21 GI Imaging: none, lbm 11/21 Surgeries / Procedures: 11/8 cystopscopy with stent placement  Central access: 11/20 TPN start date: 11/20  Nutritional Goals (per RD recommendation on 11/19): kCal: 1400-1600, Protein: 75-85, Fluid: 1.3-1.5 L/day Goal TPN rate is 62 mL/hr (provides 81.84 g of protein and 1532.24 kcals per day)  Current Nutrition:  TPN  Plan:  Increase TPN to 62 mL/hr at 1800 ( full rate per discussion with attending) Electrolytes in TPN: 47mEq/L of Na, 27mEq/L of K, 66mEq/L of Ca, 56mEq/L of Mg, and 4mmol/L of Phos. Cl:Ac ratio 1:1 No additional K+, Mg or Phos replenishment warranted Will check Mg, K, and Phos with am labs Add standard MVI and trace elements to TPN Initiate Sensitive q8h SSI and adjust as needed  Continue MIVF at 20 mL/hr at 1800 Monitor TPN labs on Mon/Thurs. CBC, Albumin, Phos, Mg, CMP.   Cathy Solis, PharmD, BCPS 10/13/2020,9:31 AM

## 2020-10-13 NOTE — TOC Progression Note (Signed)
Transition of Care [facility]) - Progression Note    Patient Details  Name: Cathy Solis MRN: [id number] Date of Birth: [birth year]  [facility]) CM/SW Contact  Shelbie Hutching, RN Phone Number: 10/13/2020, 2:03 PM  Clinical Narrative:    TOC continues to follow patient for discharge planning.  Plan remains for patient to discharge to [facility].  Patient was started on TPN 11/20 because patient is unable to tolerate food and concern for bowel obstruction.  GI following, patient is not medically stable for discharge.     Expected Discharge Plan: [place] Barriers to Discharge: Continued Medical Work up  Expected Discharge Plan and Services Expected Discharge Plan: [place]   Discharge Planning Services: CM Consult Post Acute Care Choice: [place] Living arrangements for the past 2 months: Single Family Home                   DME Agency: NA       HH Arranged: NA           Social Determinants of Health (SDOH) Interventions    Readmission Risk Interventions No flowsheet data found.

## 2020-10-13 NOTE — Progress Notes (Signed)
Lucilla Lame, MD [address] [place], [place] [address] Phone: [phone] Fax : [phone]   Subjective: I am being asked to see the patient again for evaluation for abdominal distention and abdominal pain.  The patient was found to have duodenal compression with the CT scan showing:  IMPRESSION: 1. Markedly distended stomach. Findings are likely related to duodenal obstruction. The distal duodenum is markedly narrowed between the superior mesenteric artery and the severely dilated left renal pelvis. 2. Persistent severe left-sided hydronephrosis with marked dilation of the right renal pelvis with abrupt caliber transition at the left ureteropelvic junction. Prominent urothelial thickening. 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  The patient had a KUB yesterday that showed:  IMPRESSION: Gaseous distension of the gastric lumen. Gas noted throughout the large bowel. Limited evaluation for free intraperitoneal gas on this supine x-ray. Cannot exclude a partial or early gastric outlet obstruction. Recommend CT abdomen pelvis with intravenous contrast for further evaluation.    Objective: Vital signs in last 24 hours: Vitals:   10/12/20 2052 10/12/20 2357 10/13/20 0422 10/13/20 0805  BP:  126/67 (!) 135/56 (!) 162/65  Pulse:  61 61 63  Resp:  15 14 18   Temp:  97.8 F (36.6 C) 98.2 F (36.8 C) 97.8 F (36.6 C)  TempSrc:      SpO2: 94% 99% 98% 98%  Weight:      Height:       Weight change:   Intake/Output Summary (Last 24 hours) at 10/13/2020 1307 Last data filed at 10/13/2020 1130 Gross per 24 hour  Intake 1841.54 ml  Output 450 ml  Net 1391.54 ml     Exam: Heart:: Regular rate and rhythm, S1S2 present or without murmur or extra heart sounds Lungs: normal and clear to auscultation and percussion Abdomen: Tympanic with decreased bowel sounds.   Lab Results: @LABTEST2 @ Micro Results: Recent Results (from the past 240 hour(s))   SARS Coronavirus 2 by RT PCR (hospital order, performed in [facility] hospital lab) Nasopharyngeal Nasopharyngeal Swab     Status: None   Collection Time: 10/05/20  1:02 PM   Specimen: Nasopharyngeal Swab  Result Value Ref Range Status   SARS Coronavirus 2 NEGATIVE NEGATIVE Final    Comment: (NOTE) SARS-CoV-2 target nucleic acids are NOT DETECTED.  The SARS-CoV-2 RNA is generally detectable in upper and lower respiratory specimens during the acute phase of infection. The lowest concentration of SARS-CoV-2 viral copies this assay can detect is 250 copies / mL. A negative result does not preclude SARS-CoV-2 infection and should not be used as the sole basis for treatment or other patient management decisions.  A negative result may occur with improper specimen collection / handling, submission of specimen other than nasopharyngeal swab, presence of viral mutation(s) within the areas targeted by this assay, and inadequate number of viral copies (<250 copies / mL). A negative result must be combined with clinical observations, patient history, and epidemiological information.  Fact Sheet for Patients:   StrictlyIdeas.no  Fact Sheet for Healthcare Providers: BankingDealers.co.za  This test is not yet approved or  cleared by the [place] FDA and has been authorized for detection and/or diagnosis of SARS-CoV-2 by FDA under an Emergency Use Authorization (EUA).  This EUA will remain in effect (meaning this test can be used) for the duration of the COVID-19 declaration under Section 564(b)(1) of the Act, 21 U.S.C. section 360bbb-3(b)(1), unless the authorization is terminated or revoked sooner.  Performed at [facility],  [place], [place] [address]   Resp Panel by RT PCR (RSV, Flu A&B, Covid) - Urine, Clean Catch     Status: None   Collection Time: 10/06/20  1:50 PM   Specimen: Urine, Clean Catch  Result  Value Ref Range Status   SARS Coronavirus 2 by RT PCR NEGATIVE NEGATIVE Final    Comment: (NOTE) SARS-CoV-2 target nucleic acids are NOT DETECTED.  The SARS-CoV-2 RNA is generally detectable in upper respiratoy specimens during the acute phase of infection. The lowest concentration of SARS-CoV-2 viral copies this assay can detect is 131 copies/mL. A negative result does not preclude SARS-Cov-2 infection and should not be used as the sole basis for treatment or other patient management decisions. A negative result may occur with  improper specimen collection/handling, submission of specimen other than nasopharyngeal swab, presence of viral mutation(s) within the areas targeted by this assay, and inadequate number of viral copies (<131 copies/mL). A negative result must be combined with clinical observations, patient history, and epidemiological information. The expected result is Negative.  Fact Sheet for Patients:  PinkCheek.be  Fact Sheet for Healthcare Providers:  GravelBags.it  This test is no t yet approved or cleared by the [place] FDA and  has been authorized for detection and/or diagnosis of SARS-CoV-2 by FDA under an Emergency Use Authorization (EUA). This EUA will remain  in effect (meaning this test can be used) for the duration of the COVID-19 declaration under Section 564(b)(1) of the Act, 21 U.S.C. section 360bbb-3(b)(1), unless the authorization is terminated or revoked sooner.     Influenza A by PCR NEGATIVE NEGATIVE Final   Influenza B by PCR NEGATIVE NEGATIVE Final    Comment: (NOTE) The Xpert Xpress SARS-CoV-2/FLU/RSV assay is intended as an aid in  the diagnosis of influenza from Nasopharyngeal swab specimens and  should not be used as a sole basis for treatment. Nasal washings and  aspirates are unacceptable for Xpert Xpress SARS-CoV-2/FLU/RSV  testing.  Fact Sheet for  Patients: PinkCheek.be  Fact Sheet for Healthcare Providers: GravelBags.it  This test is not yet approved or cleared by the [place] FDA and  has been authorized for detection and/or diagnosis of SARS-CoV-2 by  FDA under an Emergency Use Authorization (EUA). This EUA will remain  in effect (meaning this test can be used) for the duration of the  Covid-19 declaration under Section 564(b)(1) of the Act, 21  U.S.C. section 360bbb-3(b)(1), unless the authorization is  terminated or revoked.    Respiratory Syncytial Virus by PCR NEGATIVE NEGATIVE Final    Comment: (NOTE) Fact Sheet for Patients: PinkCheek.be  Fact Sheet for Healthcare Providers: GravelBags.it  This test is not yet approved or cleared by the [place] FDA and  has been authorized for detection and/or diagnosis of SARS-CoV-2 by  FDA under an Emergency Use Authorization (EUA). This EUA will remain  in effect (meaning this test can be used) for the duration of the  COVID-19 declaration under Section 564(b)(1) of the Act, 21 U.S.C.  section 360bbb-3(b)(1), unless the authorization is terminated or  revoked. Performed at [facility], [address] [place], [place] [address]   Urine Culture     Status: None   Collection Time: 10/06/20  1:50 PM   Specimen: Urine, Random  Result Value Ref Range Status   Specimen Description   Final    URINE, RANDOM Performed at [facility], [address], [place], [place] [address]    Special Requests  Final    NONE Performed at [facility], [address], [place], [place] [address]    Culture   Final    NO GROWTH Performed at [facility], [place] [address] [place], [place] [address]    Report Status 10/08/2020 FINAL  Final   Studies/Results: DG Abd Portable 1V  Result Date: 10/12/2020 CLINICAL DATA:  Mid to lower  back pain and abdominal distension. EXAM: PORTABLE ABDOMEN - 1 VIEW COMPARISON:  X-ray abdomen 10/09/2020. FINDINGS: Several loops of bowel appear distended with gas with suggestion of gastric distension within the left upper abdomen. Gas overlying the right groin region likely related to known inguinal hernia. There is limited evaluation for free intraperitoneal gas on this supine image. A left ureteral stent is noted with the proximal pigtail overlying the expected region of the left renal shadow in the distal pigtail overlying the pelvis in the expected region of the urinary bladder. No radio-opaque calculi or other significant radiographic abnormality are seen. Dextroscoliosis of the thoracolumbar spine. IMPRESSION: Gaseous distension of the gastric lumen. Gas noted throughout the large bowel. Limited evaluation for free intraperitoneal gas on this supine x-ray. Cannot exclude a partial or early gastric outlet obstruction. Recommend CT abdomen pelvis with intravenous contrast for further evaluation. Electronically Signed   By: Iven Finn M.D.   On: 10/12/2020 15:23   Medications: I have reviewed the patient's current medications. Scheduled Meds: . apixaban  2.5 mg Oral BID  . Chlorhexidine Gluconate Cloth  6 each Topical Daily  . feeding supplement  237 mL Oral TID BM  . fluticasone furoate-vilanterol  1 puff Inhalation Daily  . insulin aspart  0-9 Units Subcutaneous Q8H  . ipratropium-albuterol  3 mL Nebulization BID  . levothyroxine  150 mcg Per Tube Q0600  . magic mouthwash  5 mL Oral QID   And  . lidocaine  5 mL Mouth/Throat QID  . metoprolol tartrate  50 mg Per Tube BID  . ondansetron (ZOFRAN) IV  4 mg Intravenous Once  . pantoprazole (PROTONIX) IV  40 mg Intravenous Q12H  . polyethylene glycol  17 g Oral Daily  . sodium chloride flush  10-40 mL Intracatheter Q12H  . sucralfate  1 g Per Tube TID WC & HS   Continuous Infusions: . sodium chloride 20 mL/hr at 10/12/20 1624  .  cefTRIAXone (ROCEPHIN)  IV Stopped (10/12/20 1508)  . TPN ADULT (ION) 42 mL/hr at 10/12/20 1847  . TPN ADULT (ION)     PRN Meds:.acetaminophen, albuterol, bisacodyl, bisacodyl, hydrALAZINE, iohexol, ondansetron (ZOFRAN) IV, phenol, sodium chloride flush   Assessment: Principal Problem:   Abdominal pain Active Problems:   Hypothyroidism   Hydronephrosis   Tobacco abuse   Hypertension   Severe protein-calorie malnutrition (HCC)   Atrial fibrillation ([place])    Plan: This patient is unlikely to have small bowel obstruction with most of the issue being dilation of the stomach at the level of the duodenum and not the small bowel.  The patient's constipation is also unlikely causing the symptoms due to no backup of the small bowel with gas.  The recommendation by radiology is for a repeat CT scan and from Dr. Verlin Grills last note that was also considered.  Unlikely that the obstruction can be relieved by a luminal evaluation or procedure such as an upper endoscopy.  I would recommend considering repeat placement of the NG tube and defer to urology and general surgery.   LOS: 7 days   Lucilla Lame, [facility]  10/13/2020, 1:07 PM Pager [phone] 7am-5pm  Check AMION for 5pm -7am coverage and on weekends

## 2020-10-13 NOTE — Progress Notes (Signed)
PROGRESS NOTE    Cathy Solis   XIP:[id number]  DOB: [birth year]  PCP: Valerie Roys, DO    DOA: 10/06/2020 LOS: 7   Brief Narrative   HPI on admission, by Dr. Posey Pronto: "Cathy Solis is a 79 y.o. 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  Patient was recently hospitalized on 09/27/2020 and discharged on 10/05/2020.  Patient during recent admission was admitted for pyelonephritis, and discharged with antibiotics and outpatient follow-up with urologist, cardiologist and primary care provider.  She was found to have acute Pilo and AKI with culture showing E. coli and discharged on p.o. Levaquin.  Patient was also seen by cardiologist for A. fib RVR and started on Eliquis for CHADS2 score of 6. Prior to previous admission patient was not on home O2 was discharged on 2 L by nasal cannula.  Sister [name] was involved in patient's care and her son [name]."     Assessment & Plan   Principal Problem:   Abdominal pain Active Problems:   Hypothyroidism   Hydronephrosis   Tobacco abuse   Hypertension   Severe protein-calorie malnutrition (HCC)   Atrial fibrillation (HCC)   Abdominal distention   Duodenal obstruction  Abdominal Pain - multifactorial due to duodenal compression vs SBO due to hernia vs stool impaction vs ?PUD / esophagitis.  Imaging on admission concerning for extrinsic compression of duodenum by severe renal pelvis dilation and SMA, markedly distended stomach consistent with this as well.  NG tube was placed with some improvement when it was on suction.   11/20 Distention and pain worse with NG off suction and laxatives given per NG.    11/21 Much improvement once bowel regimen finally took effect. 11/22 Increased distention, reverted to clear liquids 11/23 Repeat CT pending --Consults: GI, urology, general surgery --NG was taken out on 11/21, very low threshold to replace NG tube if persistent distention --Clear liquids --IV Protonix BID --Bowel regimen, as below - backed off on 11/21 after several type 7 stools and distention resolved --Monitor abdominal exam closely --Continue TPN for now until adequate oral intake  Acute kidney injury vs CKD - POA.  Renal function improving.  Hold lisinopril for now (BP soft as well).  Monitor BMP.    Acute vs Recurrent Pyelonephritis - recently admitted for this, as above.  Continue IV Rocephin.  Urine culture negative this admission.  Urology recommends 2 weeks of antibiotics.   --11/21: small amount gross hematuria noted in Foley bag Foley d/c'd on 11/22 per urology.  Left hydronephrosis - urology consulted.  Chronic issue that appears stable on imaging from prior.  Status post Left ureteral stent placement with Dr. Diamantina Providence on 11/18.   Continue IV antibiotics as above. Per urology, d/c Foley today (11/22). Urology anticipates long-term stent exchanges every 6-12 months.   Follow up in urology clinic in 1 month.  Constipation - d/c scheduled MiraLAX and enemas for now, will give daily PRN if no BM in 24 hours.  Also has PRN Dulcolax PO or suppositories.  Oral thrush - magic mouthwash with lidocaine A-fib - rate controlled.  Continue Eliquis, metoprolol.  Telemetry monitoring.  Essential hypertension - lisinopril is held due to AKI on admission and BP soft  without it.  Continue metoprolol.  PRN hydralazine IV.  Hypothyroidism - continue levothyroxine.  TSH normal.  Severe protein calorie malnutrition - dietician following.  Oral nutrition supplements per orders.  Follow daily K, Mg, Phos due to high risk for refeeding syndrome.  Body mass index is 18.82 kg/m.  --PICC line   --On short term TPN - continue until adequate PO --Dietician following  Generalized weakness - discharged to [facility] for short term rehab after prior admission.  OT recommends SNF.  PT evaluation pending.  Hx of tobacco abuse - nicotine patch discontinued at patient request.  She does smoke 1-2 ppd, but declines NRT.   DVT prophylaxis: apixaban (ELIQUIS) tablet 2.5 mg Start: 10/08/20 2200 SCDs Start: 10/06/20 1336.  On Eliquis   Diet:  Diet Orders (From admission, onward)    Start     Ordered   10/12/20 1425  Diet clear liquid Room service appropriate? Yes; Fluid consistency: Thin  Diet effective now       Comments: Please bring more salty than sweet items, pt dislikes sweet  Question Answer Comment  Room service appropriate? Yes   Fluid consistency: Thin      10/12/20 1424            Code Status: Full Code    Subjective 10/13/20    Pt seen with sister at bedside today. Just up to chair after working with PT.  Reports feeling the same, no better.  Not able to provide specifics of abdominal pain, nausea or vomiting.  Still hungry and wants to get out of the hospital.   Disposition Plan & Communication   Status is: Inpatient  Remains inpatient appropriate because:Inpatient level of care appropriate due to severity of illness, inadequate oral intake on TPN, on IV antibiotics.  Recurrent distention and not tolerating advancement of diet.   Dispo: The patient is from: Home              Anticipated d/c is to: Home              Anticipated d/c date is: >3 days              Patient currently is not medically stable to d/c.   Family Communication: sister at bedside on rounds today.     Consults, Procedures, Significant Events   Consultants:   Gastroenterology  Urology  General surgery  Procedures:   NG tube placed / discontinued on 11/21  Ureteral stent placement 11/18  Antimicrobials:  Anti-infectives (From admission, onward)   Start     Dose/Rate  Route Frequency Ordered Stop   10/07/20 1530  cefTRIAXone (ROCEPHIN) 1 g in sodium chloride 0.9 % 100 mL IVPB        1 g 200 mL/hr over 30 Minutes Intravenous Every 24 hours 10/07/20 1435          Objective   Vitals:   10/12/20 2052 10/12/20 2357 10/13/20 0422 10/13/20 0805  BP:  126/67 (!) 135/56 (!) 162/65  Pulse:  61 61 63  Resp:  15 14 18   Temp:  97.8 F (36.6 C) 98.2 F (36.8 C) 97.8 F (36.6 C)  TempSrc:      SpO2: 94% 99% 98% 98%  Weight:      Height:        Intake/Output Summary (Last 24 hours) at 10/13/2020 1530 Last data filed at 10/13/2020 1130 Gross per 24 hour  Intake 1721.54 ml  Output 450 ml  Net 1271.54 ml  Filed Weights   10/06/20 0822  Weight: 51.3 kg    Physical Exam:  General exam: awake, alert, no acute distress, cachectic, up in chair Respiratory system: CTAB with diminished breath sounds at bases, no wheezes or rhonchi, normal respiratory effort. Cardiovascular system: normal S1/S2, RRR, no pedal edema.   Gastrointestinal system: hypoactive bowel sounds, non-tender, no guarding or rebound, mild distention Neurologic: normal speech, grossly non-focal exam   Labs   Data Reviewed: I have personally reviewed following labs and imaging studies  CBC: Recent Labs  Lab 10/07/20 0342 10/08/20 0531 10/09/20 0354 10/12/20 0507  WBC 10.7* 11.2* 9.7 8.3  HGB 11.3* 12.6 11.5* 9.3*  HCT 34.3* 38.2 34.5* 28.6*  MCV 92.2 92.3 91.3 96.0  PLT 358 363 287 081   Basic Metabolic Panel: Recent Labs  Lab 10/08/20 0531 10/08/20 0531 10/09/20 0354 10/10/20 0423 10/11/20 0625 10/12/20 0559 10/13/20 0438  NA 133*   < > 134* 133* 133* 134* 133*  K 3.8   < > 3.6 3.7 3.2* 3.7 3.8  CL 98   < > 99 98 99 104 103  CO2 25   < > 26 27 24  21* 22  GLUCOSE 70   < > 78 100* 104* 106* 99  BUN 24*   < > 25* 25* 25* 23 26*  CREATININE 1.29*   < > 1.37* 1.36* 1.32* 1.23* 1.10*  CALCIUM 8.3*   < > 8.4* 8.3* 8.1* 8.0* 8.0*  MG 1.7   < > 1.7 2.1 1.9 1.7 2.1   PHOS 2.8  --  2.8 2.2* 2.5 2.5  --    < > = values in this interval not displayed.   GFR: Estimated Creatinine Clearance: 32.5 mL/min (A) (by C-G formula based on SCr of 1.1 mg/dL (H)). Liver Function Tests: Recent Labs  Lab 10/07/20 0342 10/12/20 0559  AST 12* 13*  ALT 9 7  ALKPHOS 40 35*  BILITOT 0.5 0.4  PROT 5.6* 5.0*  ALBUMIN 2.1* 1.9*   No results for input(s): LIPASE, AMYLASE in the last 168 hours. No results for input(s): AMMONIA in the last 168 hours. Coagulation Profile: No results for input(s): INR, PROTIME in the last 168 hours. Cardiac Enzymes: No results for input(s): CKTOTAL, CKMB, CKMBINDEX, TROPONINI in the last 168 hours. BNP (last 3 results) No results for input(s): PROBNP in the last 8760 hours. HbA1C: No results for input(s): HGBA1C in the last 72 hours. CBG: Recent Labs  Lab 10/12/20 1235 10/12/20 1433 10/12/20 1929 10/12/20 2358 10/13/20 0811  GLUCAP 99 105* 106* 106* 91   Lipid Profile: Recent Labs    10/11/20 0625  TRIG 124   Thyroid Function Tests: No results for input(s): TSH, T4TOTAL, FREET4, T3FREE, THYROIDAB in the last 72 hours. Anemia Panel: No results for input(s): VITAMINB12, FOLATE, FERRITIN, TIBC, IRON, RETICCTPCT in the last 72 hours. Sepsis Labs: No results for input(s): PROCALCITON, LATICACIDVEN in the last 168 hours.  Recent Results (from the past 240 hour(s))  SARS Coronavirus 2 by RT PCR (hospital order, performed in [facility] hospital lab) Nasopharyngeal Nasopharyngeal Swab     Status: None   Collection Time: 10/05/20  1:02 PM   Specimen: Nasopharyngeal Swab  Result Value Ref Range Status   SARS Coronavirus 2 NEGATIVE NEGATIVE Final    Comment: (NOTE) SARS-CoV-2 target nucleic acids are NOT DETECTED.  The SARS-CoV-2 RNA is generally detectable in upper and lower respiratory specimens during the acute phase of infection. The lowest concentration of SARS-CoV-2 viral copies this  assay can detect is 250 copies /  mL. A negative result does not preclude SARS-CoV-2 infection and should not be used as the sole basis for treatment or other patient management decisions.  A negative result may occur with improper specimen collection / handling, submission of specimen other than nasopharyngeal swab, presence of viral mutation(s) within the areas targeted by this assay, and inadequate number of viral copies (<250 copies / mL). A negative result must be combined with clinical observations, patient history, and epidemiological information.  Fact Sheet for Patients:   StrictlyIdeas.no  Fact Sheet for Healthcare Providers: BankingDealers.co.za  This test is not yet approved or  cleared by the [place] FDA and has been authorized for detection and/or diagnosis of SARS-CoV-2 by FDA under an Emergency Use Authorization (EUA).  This EUA will remain in effect (meaning this test can be used) for the duration of the COVID-19 declaration under Section 564(b)(1) of the Act, 21 U.S.C. section 360bbb-3(b)(1), unless the authorization is terminated or revoked sooner.  Performed at [facility], [address] [place], [place] [address]   Resp Panel by RT PCR (RSV, Flu A&B, Covid) - Urine, Clean Catch     Status: None   Collection Time: 10/06/20  1:50 PM   Specimen: Urine, Clean Catch  Result Value Ref Range Status   SARS Coronavirus 2 by RT PCR NEGATIVE NEGATIVE Final    Comment: (NOTE) SARS-CoV-2 target nucleic acids are NOT DETECTED.  The SARS-CoV-2 RNA is generally detectable in upper respiratoy specimens during the acute phase of infection. The lowest concentration of SARS-CoV-2 viral copies this assay can detect is 131 copies/mL. A negative result does not preclude SARS-Cov-2 infection and should not be used as the sole basis for treatment or other patient management decisions. A negative result may occur with  improper specimen  collection/handling, submission of specimen other than nasopharyngeal swab, presence of viral mutation(s) within the areas targeted by this assay, and inadequate number of viral copies (<131 copies/mL). A negative result must be combined with clinical observations, patient history, and epidemiological information. The expected result is Negative.  Fact Sheet for Patients:  PinkCheek.be  Fact Sheet for Healthcare Providers:  GravelBags.it  This test is no t yet approved or cleared by the [place] FDA and  has been authorized for detection and/or diagnosis of SARS-CoV-2 by FDA under an Emergency Use Authorization (EUA). This EUA will remain  in effect (meaning this test can be used) for the duration of the COVID-19 declaration under Section 564(b)(1) of the Act, 21 U.S.C. section 360bbb-3(b)(1), unless the authorization is terminated or revoked sooner.     Influenza A by PCR NEGATIVE NEGATIVE Final   Influenza B by PCR NEGATIVE NEGATIVE Final    Comment: (NOTE) The Xpert Xpress SARS-CoV-2/FLU/RSV assay is intended as an aid in  the diagnosis of influenza from Nasopharyngeal swab specimens and  should not be used as a sole basis for treatment. Nasal washings and  aspirates are unacceptable for Xpert Xpress SARS-CoV-2/FLU/RSV  testing.  Fact Sheet for Patients: PinkCheek.be  Fact Sheet for Healthcare Providers: GravelBags.it  This test is not yet approved or cleared by the [place] FDA and  has been authorized for detection and/or diagnosis of SARS-CoV-2 by  FDA under an Emergency Use Authorization (EUA). This EUA will remain  in effect (meaning this test can be used) for the duration of the  Covid-19 declaration under Section 564(b)(1) of the Act, 21  U.S.C. section 360bbb-3(b)(1), unless the authorization  is  terminated or revoked.    Respiratory  Syncytial Virus by PCR NEGATIVE NEGATIVE Final    Comment: (NOTE) Fact Sheet for Patients: PinkCheek.be  Fact Sheet for Healthcare Providers: GravelBags.it  This test is not yet approved or cleared by the [place] FDA and  has been authorized for detection and/or diagnosis of SARS-CoV-2 by  FDA under an Emergency Use Authorization (EUA). This EUA will remain  in effect (meaning this test can be used) for the duration of the  COVID-19 declaration under Section 564(b)(1) of the Act, 21 U.S.C.  section 360bbb-3(b)(1), unless the authorization is terminated or  revoked. Performed at [facility], [address], [place], [place] [address]   Urine Culture     Status: None   Collection Time: 10/06/20  1:50 PM   Specimen: Urine, Random  Result Value Ref Range Status   Specimen Description   Final    URINE, RANDOM Performed at [facility], [address], [place], [place] [address]    Special Requests   Final    NONE Performed at [facility], [address], [place], [place] [address]    Culture   Final    NO GROWTH Performed at [facility], [place] [address] [place],  [address]    Report Status 10/08/2020 FINAL  Final      Imaging Studies   DG Abd Portable 1V  Result Date: 10/12/2020 CLINICAL DATA:  Mid to lower back pain and abdominal distension. EXAM: PORTABLE ABDOMEN - 1 VIEW COMPARISON:  X-ray abdomen 10/09/2020. FINDINGS: Several loops of bowel appear distended with gas with suggestion of gastric distension within the left upper abdomen. Gas overlying the right groin region likely related to known inguinal hernia. There is limited evaluation for free intraperitoneal gas on this supine image. A left ureteral stent is noted with the proximal pigtail overlying the expected region of the left renal shadow in the distal pigtail overlying the pelvis in the expected region of  the urinary bladder. No radio-opaque calculi or other significant radiographic abnormality are seen. Dextroscoliosis of the thoracolumbar spine. IMPRESSION: Gaseous distension of the gastric lumen. Gas noted throughout the large bowel. Limited evaluation for free intraperitoneal gas on this supine x-ray. Cannot exclude a partial or early gastric outlet obstruction. Recommend CT abdomen pelvis with intravenous contrast for further evaluation. Electronically Signed   By: Iven Finn M.D.   On: 10/12/2020 15:23     Medications   Scheduled Meds: . apixaban  2.5 mg Oral BID  . Chlorhexidine Gluconate Cloth  6 each Topical Daily  . feeding supplement  237 mL Oral TID BM  . fluticasone furoate-vilanterol  1 puff Inhalation Daily  . insulin aspart  0-9 Units Subcutaneous Q8H  . ipratropium-albuterol  3 mL Nebulization BID  . levothyroxine  150 mcg Per Tube Q0600  . magic mouthwash  5 mL Oral QID   And  . lidocaine  5 mL Mouth/Throat QID  . metoprolol tartrate  50 mg Per Tube BID  . ondansetron (ZOFRAN) IV  4 mg Intravenous Once  . pantoprazole (PROTONIX) IV  40 mg Intravenous Q12H  . polyethylene glycol  17 g Oral Daily  . sodium chloride flush  10-40 mL Intracatheter Q12H  . sucralfate  1 g Per Tube TID WC & HS   Continuous Infusions: . sodium chloride 20 mL/hr at 10/12/20 1624  . cefTRIAXone (ROCEPHIN)  IV 1 g (10/13/20 1458)  . TPN ADULT (ION) 42 mL/hr  at 10/12/20 1847  . TPN ADULT (ION)         LOS: 7 days    Time spent: 25 minutes with > 50% spent in coordination of care and direct patient contact    Ezekiel Slocumb, DO [facility]  10/13/2020, 3:30 PM    If 7PM-7AM, please contact night-coverage. How to contact the [facility] Attending or Consulting provider [place] or covering provider during after hours [place], for this patient?    1. Check the care team in [facility] and look for a) attending/consulting [facility] provider listed and b) the [facility] team listed 2. Log into  [email or web address] and use [place]'s universal password to access. If you do not have the password, please contact the hospital operator. 3. Locate the [facility] provider you are looking for under [facility] and page to a number that you can be directly reached. 4. If you still have difficulty reaching the provider, please page the [facility] (Director on Call) for the Hospitalists listed on amion for assistance.

## 2020-10-14 ENCOUNTER — Inpatient Hospital Stay: Payer: Medicare HMO

## 2020-10-14 DIAGNOSIS — K315 Obstruction of duodenum: Secondary | ICD-10-CM | POA: Diagnosis not present

## 2020-10-14 DIAGNOSIS — R1012 Left upper quadrant pain: Secondary | ICD-10-CM | POA: Diagnosis not present

## 2020-10-14 DIAGNOSIS — Z720 Tobacco use: Secondary | ICD-10-CM

## 2020-10-14 DIAGNOSIS — I480 Paroxysmal atrial fibrillation: Secondary | ICD-10-CM | POA: Diagnosis not present

## 2020-10-14 DIAGNOSIS — R140 Abdominal distension (gaseous): Secondary | ICD-10-CM | POA: Diagnosis not present

## 2020-10-14 DIAGNOSIS — Z789 Other specified health status: Secondary | ICD-10-CM

## 2020-10-14 LAB — MAGNESIUM: Magnesium: 1.8 mg/dL (ref 1.7–2.4)

## 2020-10-14 LAB — GLUCOSE, CAPILLARY
Glucose-Capillary: 117 mg/dL — ABNORMAL HIGH (ref 70–99)
Glucose-Capillary: 108 mg/dL — ABNORMAL HIGH (ref 70–99)
Glucose-Capillary: 112 mg/dL — ABNORMAL HIGH (ref 70–99)
Glucose-Capillary: 123 mg/dL — ABNORMAL HIGH (ref 70–99)

## 2020-10-14 LAB — BASIC METABOLIC PANEL
Anion gap: 7 (ref 5–15)
BUN: 24 mg/dL — ABNORMAL HIGH (ref 8–23)
CO2: 23 mmol/L (ref 22–32)
Calcium: 8.3 mg/dL — ABNORMAL LOW (ref 8.9–10.3)
Chloride: 103 mmol/L (ref 98–111)
Creatinine, Ser: 0.98 mg/dL (ref 0.44–1.00)
GFR, Estimated: 58 mL/min — ABNORMAL LOW (ref >60)
Glucose, Bld: 114 mg/dL — ABNORMAL HIGH (ref 70–99)
Potassium: 3.8 mmol/L (ref 3.5–5.1)
Sodium: 133 mmol/L — ABNORMAL LOW (ref 135–145)

## 2020-10-14 LAB — PHOSPHORUS: Phosphorus: 2.9 mg/dL (ref 2.5–4.6)

## 2020-10-14 MED ORDER — INSULIN ASPART 100 UNIT/ML ~~LOC~~ SOLN: 0.0000 [IU] | Freq: Once | SUBCUTANEOUS | Status: DC

## 2020-10-14 MED ORDER — DIATRIZOATE MEGLUMINE & SODIUM 66-10 % PO SOLN
90.0000 mL | Freq: Once | ORAL | Status: AC
  Administered 2020-10-14: 90 mL via ORAL

## 2020-10-14 MED ORDER — TRAVASOL 10 % IV SOLN
INTRAVENOUS | Status: AC
  Filled 2020-10-14: qty 818.4

## 2020-10-14 NOTE — Progress Notes (Signed)
The patient's imaging from yesterday was reviewed and the case was discussed with the hospitalist this morning.  The patient has been seen again by surgery today and has ordered a small bowel follow-through.  As discussed with the hospitalist who was previously taking care of the patient, Dr. Arbutus Ped, and the hospitalist who is taking over there is very little to do from a GI point of view at this time.  None of the findings on the CT scan would be amenable to any luminal evaluation.  The patient's family has been told that there is no further role for GI in the situation at this time.  Please contact us if situation changes and GI as needed in the future.

## 2020-10-14 NOTE — Consult Note (Signed)
PHARMACY - TOTAL PARENTERAL NUTRITION CONSULT NOTE   Indication: concern for possible obstruction related to extrinsic compression of duodenum and inability to assess patient's tolerance of PO diet due to poor intake  Patient Measurements: Height: 5\' 5"  (165.1 cm) Weight: 51.3 kg (113 lb 1.5 oz) IBW/kg (Calculated) : 57 TPN AdjBW (KG): 51.3 Body mass index is 18.82 kg/m. Usual Weight: 51.5 kg  Assessment: Due to concern for possible obstruction related to extrinsic compression of duodenum and inability to assess patient's tolerance of PO diet due to poor intake, plan is for continuation of TPN.   Glucose / Insulin: BG 99-117, 0 units insulin given. A1c 5.9  Electrolytes:  Na 133, K+ 3.8, Phos 2.9, Mg 1.8 Renal: Scr 1.36 > 1.32>1.23>1.1>0.98 LFTs / TGs: WNL. Prealbumin / albumin: 2.1 Intake / Output; MIVF: NS @ 20, TPN @ 76ml/hr, I/O net +1345 11/23 GI Imaging: none, lbm 11/24 Surgeries / Procedures: 11/8 cystopscopy with stent placement  Central access: 11/20 TPN start date: 11/20  Nutritional Goals (per RD recommendation on 11/19): kCal: 1400-1600, Protein: 75-85, Fluid: 1.3-1.5 L/day Goal TPN rate is 62 mL/hr (provides 81.84 g of protein and 1532.24 kcals per day)  Current Nutrition:  TPN  Plan:  Continue TPN @ 62 mL/hr at 1800  Electrolytes in TPN: 23mEq/L of Na, 28mEq/L of K, 89mEq/L of Ca, 31mEq/L of Mg, and 47mmol/L of Phos. Cl:Ac ratio 1:1 No additional K+, Mg or Phos replenishment warranted Will check Mg, K, and Phos with am labs Add standard MVI and trace elements to TPN Initiate Sensitive q8h SSI and adjust as needed  Continue MIVF at 20 mL/hr at 1800 Monitor TPN labs on Mon/Thurs. CBC, Albumin, Phos, Mg, CMP.   Lu Duffel, PharmD, BCPS 10/14/2020,10:10 AM

## 2020-10-14 NOTE — Progress Notes (Signed)
Nutrition Follow-up  DOCUMENTATION CODES:   Severe malnutrition in context of chronic illness  INTERVENTION:   TPN per pharmacy   RD will monitor for diet advancement  NUTRITION DIAGNOSIS:   Severe Malnutrition related to chronic illness (COPD, CKD) as evidenced by severe fat depletion, severe muscle depletion. Ongoing.  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  11/18 s/p cystoscopy, left retrograde pyelogram, left ureteral stent placement  Pt tolerating TPN well at goal rate. Pt advanced to a soft diet 11/21 but was unable to tolerate diet r/t abdominal distension and continued nausea and vomiting. NGT removed 11/21. Pt is currently NPO. Plan is for SBFT per surgery note. No new weight since admit; will request daily weights.   Medications reviewed and include: insulin, synthroid, zofran, protonix, miralax, carafate, NaCl '@20ml' /hr, ceftriaxone  Labs reviewed: Na 133(L), BUN 24(H), P 2.9 wnl, Mg 1.8 wnl Triglycerides 124- 11/21 Hgb 9.3(L), Hct 28.6(L) cbgs- 117, 108 x 24 hrs  Diet Order:    Diet Order            Diet NPO time specified Except for: Sips with Meds  Diet effective now                EDUCATION NEEDS:   No education needs have been identified at this time  Skin:  Skin Assessment: Skin  Integrity Issues: Skin Integrity Issues:: Other (Comment) Other: blanchable red area on bottom per RN documentation  Last BM:  11/24- type 7  Height:   Ht Readings from Last 1 Encounters:  10/06/20 '5\' 5"'  (1.651 m)   Weight:   Wt Readings from Last 1 Encounters:  10/06/20 51.3 kg   Ideal Body Weight:  56.8 kg  BMI:  Body mass index is 18.82 kg/m.  Estimated Nutritional Needs:   Kcal:  1400-1600  Protein:  75-85 grams  Fluid:  1.3-1.5 L/day  Koleen Distance MS, RD, LDN Please refer to [facility] for RD and/or RD on-call/weekend/after hours pager

## 2020-10-14 NOTE — Progress Notes (Addendum)
Subjective:  CC: Cathy Solis is a 79 y.o. female  Hospital stay day 8, bowel obstruction  HPI: No acute issues overnight.  Pt has no complaints  ROS:  General: Denies weight loss, weight gain, fatigue, fevers, chills, and night sweats. Heart: Denies chest pain, palpitations, racing heart, irregular heartbeat, leg pain or swelling, and decreased activity tolerance. Respiratory: Denies breathing difficulty, shortness of breath, wheezing, cough, and sputum. GI: Denies change in appetite, heartburn, nausea, vomiting, constipation, diarrhea, and blood in stool. GU: Denies difficulty urinating, pain with urinating, urgency, frequency, blood in urine.   Objective:   Temp:  [97.8 F (36.6 C)-98.8 F (37.1 C)] 98.5 F (36.9 C) (11/24 0824) Pulse Rate:  [57-65] 65 (11/24 0824) Resp:  [14-19] 19 (11/24 0824) BP: (107-139)/(60-76) 138/76 (11/24 0824) SpO2:  [88 %-100 %] 88 % (11/24 0824)     Height: 5\' 5"  (165.1 cm) Weight: 51.3 kg BMI (Calculated): 18.82   Intake/Output this shift:   Intake/Output Summary (Last 24 hours) at 10/14/2020 0841 Last data filed at 10/14/2020 0300 Gross per 24 hour  Intake 1545.17 ml  Output 200 ml  Net 1345.17 ml    Constitutional :  alert, cooperative, appears stated age and no distress  Respiratory:  clear to auscultation bilaterally  Cardiovascular:  irregularly irregular rhythm  Gastrointestinal: soft, non-tender; bowel sounds normal; no masses,  no organomegaly.   Skin: Cool and moist.   Psychiatric: Normal affect, non-agitated, not confused       LABS:  CMP Latest Ref Rng & Units 10/14/2020 10/13/2020 10/12/2020  Glucose 70 - 99 mg/dL 114(H) 99 106(H)  BUN 8 - 23 mg/dL 24(H) 26(H) 23  Creatinine 0.44 - 1.00 mg/dL 0.98 1.10(H) 1.23(H)  Sodium 135 - 145 mmol/L 133(L) 133(L) 134(L)  Potassium 3.5 - 5.1 mmol/L 3.8 3.8 3.7  Chloride 98 - 111 mmol/L 103 103 104  CO2 22 - 32 mmol/L 23 22 21(L)  Calcium 8.9 - 10.3 mg/dL 8.3(L) 8.0(L) 8.0(L)   Total Protein 6.5 - 8.1 g/dL - - 5.0(L)  Total Bilirubin 0.3 - 1.2 mg/dL - - 0.4  Alkaline Phos 38 - 126 U/L - - 35(L)  AST 15 - 41 U/L - - 13(L)  ALT 0 - 44 U/L - - 7   CBC Latest Ref Rng & Units 10/12/2020 10/09/2020 10/08/2020  WBC 4.0 - 10.5 K/uL 8.3 9.7 11.2(H)  Hemoglobin 12.0 - 15.0 g/dL 9.3(L) 11.5(L) 12.6  Hematocrit 36 - 46 % 28.6(L) 34.5(L) 38.2  Platelets 150 - 400 K/uL 224 287 363    RADS: CLINICAL DATA:  79 year old female with abdominal distension.  EXAM: CT ABDOMEN AND PELVIS WITH CONTRAST  TECHNIQUE: Multidetector CT imaging of the abdomen and pelvis was performed using the standard protocol following bolus administration of intravenous contrast.  CONTRAST:  43mL OMNIPAQUE IOHEXOL 300 MG/ML  SOLN  COMPARISON:  CT abdomen pelvis dated 10/06/2020.  FINDINGS: Lower chest: Partially visualized small left and moderate right pleural effusion with near complete compressive atelectasis of the right lower lobe. Pneumonia is not excluded clinical correlation is recommended. There is mild cardiomegaly with multi vessel coronary vascular calcification.  No intra-abdominal free air. Small subhepatic ascites.  Hepatobiliary: Slight irregularity of the liver contour may represent early changes of cirrhosis. Clinical correlation is recommended. There is mild intrahepatic biliary ductal dilatation versus mild periportal edema. There is gallstone. No pericholecystic fluid or evidence of acute cholecystitis by CT.  Pancreas: Unremarkable. No pancreatic ductal dilatation or surrounding inflammatory changes.  Spleen: Normal  in size without focal abnormality.  Adrenals/Urinary Tract: The adrenal glands unremarkable. 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 There has been interval placement of a left ureteral stent with proximal tip in the central upper pole collecting system and distal end within the urinary bladder. There is moderate left hydronephrosis, improved  since the prior CT. Diffuse left renal cortical atrophy and areas of scarring. Apparent thickening of the urothelium of the left renal collecting system may be chronic or represent pyelonephritis. Correlation with urinalysis recommended.  There is dilatation of the left renal pelvis with overall slight interval improvement in the degree of hydronephrosis compared to the prior CT. The urinary bladder is grossly unremarkable.  Stomach/Bowel: Mildly dilated air-filled loops of small bowel with transition in the lower abdomen in the midline (49/2 and 24/5). There is an area of mesenteric swirling and twisting of the small bowel loops which may represent a mesenteric volvulus, although an internal hernia is not excluded clinical correlation is recommended. There is no pneumatosis.  Vascular/Lymphatic: Advanced aortoiliac atherosclerotic disease. The IVC is unremarkable. No portal venous gas. There is no adenopathy.  Reproductive: Hysterectomy. No adnexal masses.  Other: There is a small right inguinal hernia containing a short segment of small bowel without evidence of obstruction at this level.  Musculoskeletal: Mild diffuse subcutaneous edema and anasarca. There is osteopenia with scoliosis and degenerative changes of the spine. No acute osseous pathology.  IMPRESSION: 1. 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 Clinical correlation is recommended. No pneumatosis or portal venous gas. 2. Minimal interval improvement of the left hydronephrosis status post stent placement. There is findings concerning for left-sided pyelonephritis. Correlation with urinalysis recommended. 3. Cholelithiasis. 4. Partially visualized small left and moderate right pleural effusion with near complete compressive atelectasis of the right lower lobe. Pneumonia is not excluded clinical correlation is recommended. 5. Aortic  Atherosclerosis (ICD10-I70.0).   Electronically Signed   By: Anner Crete M.D.   On: 10/13/2020 19:11  Assessment:   Persistent bowel obstruction?  Seems to be tolerating clears, but CT from last night concern for mesenteric twisting vs internal hernia causing bowel dilation.  I reviewed CT myself and agree with assessment on imaging, but clinically her abdomen is soft, very subtle distention, and sister at bedside reports pt has been passing flatus.  Will proceed with SBFT to see if she truly has obstructive process.  No signs of peritonitis or bowel compromise at this point, so safe to proceed conservatively

## 2020-10-14 NOTE — Progress Notes (Addendum)
Subjective:  CC: Cathy Solis is a 79 y.o. female  Hospital stay day 8,   HPI: Reported several BMs since drinking contrast for SBFT from RN and pt.  Pt continues to deny any pain, N/V.    ROS:  General: Denies weight loss, weight gain, fatigue, fevers, chills, and night sweats. Heart: Denies chest pain, palpitations, racing heart, irregular heartbeat, leg pain or swelling, and decreased activity tolerance. Respiratory: Denies breathing difficulty, shortness of breath, wheezing, cough, and sputum. GI: Denies change in appetite, heartburn, nausea, vomiting, constipation, diarrhea, and blood in stool. GU: Denies difficulty urinating, pain with urinating, urgency, frequency, blood in urine.   Objective:   Temp:  [98.2 F (36.8 C)-98.6 F (37 C)] 98.6 F (37 C) (11/24 1235) Pulse Rate:  [57-72] 72 (11/24 1235) Resp:  [15-19] 19 (11/24 0824) BP: (131-177)/(63-76) 177/75 (11/24 1235) SpO2:  [88 %-100 %] 98 % (11/24 1235)     Height: 5\' 5"  (165.1 cm) Weight: 51.3 kg BMI (Calculated): 18.82   Intake/Output this shift:   Intake/Output Summary (Last 24 hours) at 10/14/2020 2107 Last data filed at 10/14/2020 1100 Gross per 24 hour  Intake 596.61 ml  Output 200 ml  Net 396.61 ml    Constitutional :  alert, cooperative, appears stated age and no distress  Respiratory:  clear to auscultation bilaterally  Cardiovascular:  regular rate and rhythm  Gastrointestinal: soft, non-tender; bowel sounds normal; no masses,  no organomegaly.   Skin: Cool and moist.   Psychiatric: Normal affect, non-agitated, not confused       LABS:  CMP Latest Ref Rng & Units 10/14/2020 10/13/2020 10/12/2020  Glucose 70 - 99 mg/dL 114(H) 99 106(H)  BUN 8 - 23 mg/dL 24(H) 26(H) 23  Creatinine 0.44 - 1.00 mg/dL 0.98 1.10(H) 1.23(H)  Sodium 135 - 145 mmol/L 133(L) 133(L) 134(L)  Potassium 3.5 - 5.1 mmol/L 3.8 3.8 3.7  Chloride 98 - 111 mmol/L 103 103 104  CO2 22 - 32 mmol/L 23 22 21(L)  Calcium 8.9 - 10.3  mg/dL 8.3(L) 8.0(L) 8.0(L)  Total Protein 6.5 - 8.1 g/dL - - 5.0(L)  Total Bilirubin 0.3 - 1.2 mg/dL - - 0.4  Alkaline Phos 38 - 126 U/L - - 35(L)  AST 15 - 41 U/L - - 13(L)  ALT 0 - 44 U/L - - 7   CBC Latest Ref Rng & Units 10/12/2020 10/09/2020 10/08/2020  WBC 4.0 - 10.5 K/uL 8.3 9.7 11.2(H)  Hemoglobin 12.0 - 15.0 g/dL 9.3(L) 11.5(L) 12.6  Hematocrit 36 - 46 % 28.6(L) 34.5(L) 38.2  Platelets 150 - 400 K/uL 224 287 363    RADS: CLINICAL DATA:  Bowel obstruction  EXAM: PORTABLE ABDOMEN - 1 VIEW  COMPARISON:  CT 10/13/2020, radiograph 10/12/2020  FINDINGS: Left-sided ureteral stent similar in position. Marked scoliosis of the spine. Mild gaseous dilatation of small and large bowel, stable to mildly decreased. Small amounts of report contrast probably within pelvic and right lower quadrant small bowel. No definitive colon contrast.  IMPRESSION: Stable to mildly decreased gaseous dilatation of small and large bowel. Small amounts of contrast probably within pelvic and right lower quadrant small bowel.   Electronically Signed   By: Donavan Foil M.D.   On: 10/14/2020 21:03  Assessment:   Pt clinically not obstructed, no evidence of bowel compromise due to completely benign abdominal exam.  Slight distention noted on previous exam has now resolved as well.  Above small bowel follow-through study may have been skewed due to patient  continuing with a clear liquid diet during contrast administration.  Recommend resuming diet and advancing as tolerated.    Although the most recent CT scan was concerning for possible internal hernia, there is no need for any immediate surgical intervention due to the asymptomatic nature of this finding.  Proceeding with just a diagnostic laparoscopy will incur too many risks with the patient's current comorbidities.  Further medical management per primary team.  Surgery will peripherally follow for now.  Please call with any specific  questions or concerns.  This encounter lasted greater than 30 minutes including patient face-to-face

## 2020-10-14 NOTE — Progress Notes (Addendum)
PROGRESS NOTE  Cathy LEVITAN TIR:[id number] DOB: Nov 10, 1941 DOA: 10/06/2020 PCP: Park Liter P, DO   LOS: 8 days   Brief narrative: As per HPI,  Cathy Belling Pierceis a 79 y.o.femalewith past medical history of hypertension, hypothyroidism, chronic atrial fibrillation on Eliquis, COPD, solitary kidney, chronic hydronephrosis and bilateral carotid stenosis presented to the ED with complaints of acute abdominal pain. Patient was recently hospitalized on 09/27/2020 and discharged on 10/05/2020 for pyelonephritis. Plan was to follow-up with urology, cardiology and primary care provider. She did have acute pyelonephritis with acute kidney injury at that time and urine culture showed E. coli. She was also seen by cardiology for atrial fibrillation with RVR and Eliquis was started for CHA2DS2-VASc score of 6. Patient was also discharged home on home oxygen at 2 L/min. Patient was then admitted to hospital for abdominal pain. Assessment/Plan:  Principal Problem:   Abdominal pain Active Problems:   Hypothyroidism   Hydronephrosis   Tobacco abuse   Hypertension   Severe protein-calorie malnutrition (HCC)   Atrial fibrillation (HCC)   Abdominal distention   Duodenal obstruction  Abdominal pain. Likely multifactorial from duodenal compression versus small bowel obstruction due to hernia, stool impaction versus peptic ulcer disease esophagitis. 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  Spoke with her GI regarding this finding.  GI recommended consultation with general surgery.  At this time general surgery has been notified and recommend barium follow-through examination.  Clinically nonacute abdomen.  NG tube was taken out on 10/11/2020 continues to have abdominal symptoms. Continue IV Protonix twice daily, currently on TPN due to inadequate oral intake.   Patient was also seen by urology during hospitalization.    History of COPD.  Was discharged home on 2 L of oxygen on last admission.  Will likely need on discharge.  Continue bronchodilators as needed.  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  As per CT scan of the abdomen.  Spoke with general surgery for further opinion.  Plan is small bowel follow-through.  Acute kidney injury vs CKD -improving. Continue to hold lisinopril. Latest creatinine of 0.9.  Acute vs Recurrent Pyelonephritis -urine culture negative at this time. Was recently admitted for acute pyelonephritis. Urology recommends 2 weeks of antibiotics.   Off Foley catheter as per urology on 10/12/2020.  Left hydronephrosis -chronic. Seen by urology.  Status post Left ureteral stent placement with Dr. Diamantina Providence on 11/18.   Continue IV antibiotic. Off Foley catheter. Urology planning for long-term stent exchanges every 6 to 12 months. Plan is to follow-up with urology clinic in 1 month. Continue IV antibiotics as above.  Constipation -continue as needed laxatives for now.  Oral thrush  continue magic mouthwash with lidocaine  Atrial fibrillation. Rate controlled. Continue Eliquis metoprolol. Continue telemetry monitor.  Essential hypertension -lisinopril on hold. Continue metoprolol. As needed hydralazine.   Hypothyroidism -TSH within normal limits. Continue Synthroid.  Severe protein calorie malnutrition -present on admission. Dietitian on board. High risk of refeeding syndrome. Monitor electrolytes closely. Consult on TPN.  Generalized weakness -patient will need skilled nursing facility placement on discharge. Seen by physical and Occupational Therapy.   Tobacco use disorder -does not wish nicotine patch. She does smoke 1-2 ppd, but declines NRT.   DVT prophylaxis: apixaban (ELIQUIS) tablet 2.5 mg Start: 10/08/20 2200 SCDs Start: 10/06/20 1336 apixaban (ELIQUIS) tablet 2.5 mg    Code Status: Full code  Family  Communication: Spoke with the patient's sister  at bedside.  Status is: Inpatient  Remains inpatient appropriate because:IV treatments appropriate due to intensity of illness or inability to take PO, Inpatient level of care appropriate due to severity of illness and Persistent GI symptoms, on TPN    Dispo:  Patient From: [facility]  Planned Disposition: [facility]  Expected discharge date: 10/16/20  Medically stable for discharge: No   Consultants:  GI  Urology  General surgery  Procedures:  NG tube placement and discontinuation  Ureteral stent placement on 11/18  Antibiotics:  . Rocephin IV >10/07/2020  Anti-infectives (From admission, onward)   Start     Dose/Rate Route Frequency Ordered Stop   10/07/20 1530  cefTRIAXone (ROCEPHIN) 1 g in sodium chloride 0.9 % 100 mL IVPB        1 g 200 mL/hr over 30 Minutes Intravenous Every 24 hours 10/07/20 1435         Subjective: Today, patient was seen and examined at bedside.  Patient's sister at bedside.  No mention of nausea vomiting or abdominal pain.  Has not had a bowel movement.  Objective: Vitals:   10/13/20 2344 10/14/20 0419  BP: 131/63 134/72  Pulse: 65 (!) 57  Resp: 15 15  Temp: 98.2 F (36.8 C) 98.2 F (36.8 C)  SpO2: 98% 100%    Intake/Output Summary (Last 24 hours) at 10/14/2020 0722 Last data filed at 10/14/2020 0300 Gross per 24 hour  Intake 1545.17 ml  Output 200 ml  Net 1345.17 ml   Filed Weights   10/06/20 0822  Weight: 51.3 kg   Body mass index is 18.82 kg/m.   Physical Exam: GENERAL: Patient is alert awake and communicative, not in obvious distress, cachectic, on nasal cannula oxygen HENT: No scleral pallor or icterus. Pupils equally reactive to light. Oral mucosa is moist NECK: is supple, no gross swelling noted. CHEST: Clear to auscultation. No crackles or wheezes.  Diminished breath sounds bilaterally. CVS: S1 and S2 heard, no murmur. Regular rate  and rhythm.  ABDOMEN: Soft, hypoactive bowel sounds, mild distention, non-tender, bowel sounds are present.  External urinary catheter. EXTREMITIES: No edema. CNS: Cranial nerves are intact. No focal motor deficits. SKIN: warm and dry without rashes.  Data Review: I have personally reviewed the following laboratory data and studies,  CBC: Recent Labs  Lab 10/08/20 0531 10/09/20 0354 10/12/20 0507  WBC 11.2* 9.7 8.3  HGB 12.6 11.5* 9.3*  HCT 38.2 34.5* 28.6*  MCV 92.3 91.3 96.0  PLT 363 287 654   Basic Metabolic Panel: Recent Labs  Lab 10/09/20 0354 10/09/20 0354 10/10/20 0423 10/11/20 0625 10/12/20 0559 10/13/20 0438 10/14/20 0455  NA 134*   < > 133* 133* 134* 133* 133*  K 3.6   < > 3.7 3.2* 3.7 3.8 3.8  CL 99   < > 98 99 104 103 103  CO2 26   < > 27 24 21* 22 23  GLUCOSE 78   < > 100* 104* 106* 99 114*  BUN 25*   < > 25* 25* 23 26* 24*  CREATININE 1.37*   < > 1.36* 1.32* 1.23* 1.10* 0.98  CALCIUM 8.4*   < > 8.3* 8.1* 8.0* 8.0* 8.3*  MG 1.7   < > 2.1 1.9 1.7 2.1 1.8  PHOS 2.8  --  2.2* 2.5 2.5  --  2.9   < > = values in this interval not displayed.   Liver Function Tests: Recent Labs  Lab 10/12/20 0559  AST 13*  ALT 7  ALKPHOS 35*  BILITOT 0.4  PROT 5.0*  ALBUMIN 1.9*   No results for input(s): LIPASE, AMYLASE in the last 168 hours. No results for input(s): AMMONIA in the last 168 hours. Cardiac Enzymes: No results for input(s): CKTOTAL, CKMB, CKMBINDEX, TROPONINI in the last 168 hours. BNP (last 3 results) No results for input(s): BNP in the last 8760 hours.  ProBNP (last 3 results) No results for input(s): PROBNP in the last 8760 hours.  CBG: Recent Labs  Lab 10/12/20 2358 10/13/20 0811 10/13/20 1657 10/13/20 1927 10/14/20 0509  GLUCAP 106* 91 100* 117* 117*   Recent Results (from the past 240 hour(s))  SARS Coronavirus 2 by RT PCR (hospital order, performed in [facility] hospital lab) Nasopharyngeal Nasopharyngeal Swab     Status: None    Collection Time: 10/05/20  1:02 PM   Specimen: Nasopharyngeal Swab  Result Value Ref Range Status   SARS Coronavirus 2 NEGATIVE NEGATIVE Final    Comment: (NOTE) SARS-CoV-2 target nucleic acids are NOT DETECTED.  The SARS-CoV-2 RNA is generally detectable in upper and lower respiratory specimens during the acute phase of infection. The lowest concentration of SARS-CoV-2 viral copies this assay can detect is 250 copies / mL. A negative result does not preclude SARS-CoV-2 infection and should not be used as the sole basis for treatment or other patient management decisions.  A negative result may occur with improper specimen collection / handling, submission of specimen other than nasopharyngeal swab, presence of viral mutation(s) within the areas targeted by this assay, and inadequate number of viral copies (<250 copies / mL). A negative result must be combined with clinical observations, patient history, and epidemiological information.  Fact Sheet for Patients:   StrictlyIdeas.no  Fact Sheet for Healthcare Providers: BankingDealers.co.za  This test is not yet approved or  cleared by the [place] FDA and has been authorized for detection and/or diagnosis of SARS-CoV-2 by FDA under an Emergency Use Authorization (EUA).  This EUA will remain in effect (meaning this test can be used) for the duration of the COVID-19 declaration under Section 564(b)(1) of the Act, 21 U.S.C. section 360bbb-3(b)(1), unless the authorization is terminated or revoked sooner.  Performed at [facility], [address] [place], [place] [address]   Resp Panel by RT PCR (RSV, Flu A&B, Covid) - Urine, Clean Catch     Status: None   Collection Time: 10/06/20  1:50 PM   Specimen: Urine, Clean Catch  Result Value Ref Range Status   SARS Coronavirus 2 by RT PCR NEGATIVE NEGATIVE Final    Comment: (NOTE) SARS-CoV-2 target nucleic acids are NOT  DETECTED.  The SARS-CoV-2 RNA is generally detectable in upper respiratoy specimens during the acute phase of infection. The lowest concentration of SARS-CoV-2 viral copies this assay can detect is 131 copies/mL. A negative result does not preclude SARS-Cov-2 infection and should not be used as the sole basis for treatment or other patient management decisions. A negative result may occur with  improper specimen collection/handling, submission of specimen other than nasopharyngeal swab, presence of viral mutation(s) within the areas targeted by this assay, and inadequate number of viral copies (<131 copies/mL). A negative result must be combined with clinical observations, patient history, and epidemiological information. The expected result is Negative.  Fact Sheet for Patients:  PinkCheek.be  Fact Sheet for Healthcare Providers:  GravelBags.it  This test is no t yet approved or cleared by the [place]  FDA and  has been authorized for detection and/or diagnosis of SARS-CoV-2 by FDA under an Emergency Use Authorization (EUA). This EUA will remain  in effect (meaning this test can be used) for the duration of the COVID-19 declaration under Section 564(b)(1) of the Act, 21 U.S.C. section 360bbb-3(b)(1), unless the authorization is terminated or revoked sooner.     Influenza A by PCR NEGATIVE NEGATIVE Final   Influenza B by PCR NEGATIVE NEGATIVE Final    Comment: (NOTE) The Xpert Xpress SARS-CoV-2/FLU/RSV assay is intended as an aid in  the diagnosis of influenza from Nasopharyngeal swab specimens and  should not be used as a sole basis for treatment. Nasal washings and  aspirates are unacceptable for Xpert Xpress SARS-CoV-2/FLU/RSV  testing.  Fact Sheet for Patients: PinkCheek.be  Fact Sheet for Healthcare Providers: GravelBags.it  This test is not yet  approved or cleared by the [place] FDA and  has been authorized for detection and/or diagnosis of SARS-CoV-2 by  FDA under an Emergency Use Authorization (EUA). This EUA will remain  in effect (meaning this test can be used) for the duration of the  Covid-19 declaration under Section 564(b)(1) of the Act, 21  U.S.C. section 360bbb-3(b)(1), unless the authorization is  terminated or revoked.    Respiratory Syncytial Virus by PCR NEGATIVE NEGATIVE Final    Comment: (NOTE) Fact Sheet for Patients: PinkCheek.be  Fact Sheet for Healthcare Providers: GravelBags.it  This test is not yet approved or cleared by the [place] FDA and  has been authorized for detection and/or diagnosis of SARS-CoV-2 by  FDA under an Emergency Use Authorization (EUA). This EUA will remain  in effect (meaning this test can be used) for the duration of the  COVID-19 declaration under Section 564(b)(1) of the Act, 21 U.S.C.  section 360bbb-3(b)(1), unless the authorization is terminated or  revoked. Performed at [facility], [address], [place], [place] [address]   Urine Culture     Status: None   Collection Time: 10/06/20  1:50 PM   Specimen: Urine, Random  Result Value Ref Range Status   Specimen Description   Final    URINE, RANDOM Performed at [facility], [address] [place], [place] [address]    Special Requests   Final    NONE Performed at [facility], [address] [place],  [address]    Culture   Final    NO GROWTH Performed at [facility], [place] [address] [place],  [address]    Report Status 10/08/2020 FINAL  Final     Studies: CT ABDOMEN PELVIS W CONTRAST  Result Date: 10/13/2020 CLINICAL DATA:  79 year old female with abdominal distension. EXAM: CT ABDOMEN AND PELVIS WITH CONTRAST TECHNIQUE: Multidetector CT imaging of the abdomen and pelvis was performed  using the standard protocol following bolus administration of intravenous contrast. CONTRAST:  76mL OMNIPAQUE IOHEXOL 300 MG/ML  SOLN COMPARISON:  CT abdomen pelvis dated 10/06/2020. FINDINGS: Lower chest: Partially visualized small left and moderate right pleural effusion with near complete compressive atelectasis of the right lower lobe. Pneumonia is not excluded clinical correlation is recommended. There is mild cardiomegaly with multi vessel coronary vascular calcification. No intra-abdominal free air. Small subhepatic ascites. Hepatobiliary: Slight irregularity of the liver contour may represent early changes of cirrhosis. Clinical correlation is recommended. There is mild intrahepatic biliary ductal dilatation versus mild periportal edema. There is gallstone. No pericholecystic fluid or evidence of acute cholecystitis by CT. Pancreas: Unremarkable. No pancreatic  ductal dilatation or surrounding inflammatory changes. Spleen: Normal in size without focal abnormality. Adrenals/Urinary Tract: The adrenal glands unremarkable. 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 There has been interval placement of a left ureteral stent with proximal tip in the central upper pole collecting system and distal end within the urinary bladder. There is moderate left hydronephrosis, improved since the prior CT. Diffuse left renal cortical atrophy and areas of scarring. Apparent thickening of the urothelium of the left renal collecting system may be chronic or represent pyelonephritis. Correlation with urinalysis recommended. There is dilatation of the left renal pelvis with overall slight interval improvement in the degree of hydronephrosis compared to the prior CT. The urinary bladder is grossly unremarkable. Stomach/Bowel: Mildly dilated air-filled loops of small bowel with transition in the lower abdomen in the midline (49/2 and 24/5). There is an area of mesenteric swirling and twisting of the small bowel loops which may represent a  mesenteric volvulus, although an internal hernia is not excluded clinical correlation is recommended. There is no pneumatosis. Vascular/Lymphatic: Advanced aortoiliac atherosclerotic disease. The IVC is unremarkable. No portal venous gas. There is no adenopathy. Reproductive: Hysterectomy. No adnexal masses. Other: There is a small right inguinal hernia containing a short segment of small bowel without evidence of obstruction at this level. Musculoskeletal: Mild diffuse subcutaneous edema and anasarca. There is osteopenia with scoliosis and degenerative changes of the spine. No acute osseous pathology. IMPRESSION: 1. 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 Clinical correlation is recommended. No pneumatosis or portal venous gas. 2. Minimal interval improvement of the left hydronephrosis status post stent placement. There is findings concerning for left-sided pyelonephritis. Correlation with urinalysis recommended. 3. Cholelithiasis. 4. Partially visualized small left and moderate right pleural effusion with near complete compressive atelectasis of the right lower lobe. Pneumonia is not excluded clinical correlation is recommended. 5. Aortic Atherosclerosis (ICD10-I70.0). Electronically Signed   By: Anner Crete M.D.   On: 10/13/2020 19:11   DG Abd Portable 1V  Result Date: 10/12/2020 CLINICAL DATA:  Mid to lower back pain and abdominal distension. EXAM: PORTABLE ABDOMEN - 1 VIEW COMPARISON:  X-ray abdomen 10/09/2020. FINDINGS: Several loops of bowel appear distended with gas with suggestion of gastric distension within the left upper abdomen. Gas overlying the right groin region likely related to known inguinal hernia. There is limited evaluation for free intraperitoneal gas on this supine image. A left ureteral stent is noted with the proximal pigtail overlying the expected region of the left renal shadow in the distal pigtail overlying the  pelvis in the expected region of the urinary bladder. No radio-opaque calculi or other significant radiographic abnormality are seen. Dextroscoliosis of the thoracolumbar spine. IMPRESSION: Gaseous distension of the gastric lumen. Gas noted throughout the large bowel. Limited evaluation for free intraperitoneal gas on this supine x-ray. Cannot exclude a partial or early gastric outlet obstruction. Recommend CT abdomen pelvis with intravenous contrast for further evaluation. Electronically Signed   By: Iven Finn M.D.   On: 10/12/2020 15:23      Flora Lipps, MD  [facility] 10/14/2020

## 2020-10-14 NOTE — Progress Notes (Signed)
Occupational Therapy Treatment Patient Details Name: Cathy Solis MRN: [id number] DOB: 01/31/1941 Today's Date: 10/14/2020    History of present illness Cathy Solis is an 50yoF who comes to [facility] on 11/16 from [facility] (recently DC from here to there <12 hours prior). Pt brought in with ABD pain, N/V. PMH: AF on eliquis, upper GIB. Pt recently in hospital for UTI. Pt underwent Left UPJ stenting. on 11/18.   OT comments  Upon entering the room, pt supine in bed having been incontinent of BM. Pt requesting son leave the room for therapist to assist pt with hygiene. Pt coming to EOB with min A for trunk support. Pt standing with min A for several minutes while therapist assisted with hygiene. Pt returning to sit on EOB secondary to fatigue. Bed linens changed around pt and she stood again with min A and began having another BM in standing. Therapist assisted with hygiene again while pt remains standing with min A for standing balance while NT changes bed linens. Min A to return for sit >supine. All needs within reach. Pt very fatigued at this time.    Follow Up Recommendations  SNF    Equipment Recommendations  3 in 1 bedside commode       Precautions / Restrictions Precautions Precautions: Fall       Mobility Bed Mobility Overal bed mobility: Needs Assistance Bed Mobility: Supine to Sit;Sit to Supine     Supine to sit: Min assist Sit to supine: Min assist   General bed mobility comments: Min A for trunk support  Transfers Overall transfer level: Needs assistance Equipment used: Rolling walker (2 wheeled) Transfers: Sit to/from Stand Sit to Stand: Min assist         General transfer comment: Pt with great effort for standing tasks.    Balance Overall balance assessment: Needs assistance Sitting-balance support: Feet supported;Single extremity supported Sitting balance-Leahy Scale: Good Sitting balance - Comments: leaning forward, but able to maintain balance    Standing balance support: Bilateral upper extremity supported Standing balance-Leahy Scale: Fair Standing balance comment: min A for standing balance        ADL either performed or assessed with clinical judgement   ADL Overall ADL's : Needs assistance/impaired        General ADL Comments: total A for hygiene     Vision Patient Visual Report: No change from baseline            Cognition Arousal/Alertness: Awake/alert Behavior During Therapy: WFL for tasks assessed/performed Overall Cognitive Status: Within Functional Limits for tasks assessed                           Pertinent Vitals/ Pain       Pain Assessment: Faces Faces Pain Scale: Hurts a little bit Pain Location: back, b/l LE chronic pain Pain Descriptors / Indicators: Discomfort Pain Intervention(s): Limited activity within patient's tolerance;Monitored during session      Frequency  Min 1X/week        Progress Toward Goals  OT Goals(current goals can now be found in the care plan section)  Progress towards OT goals: Progressing toward goals  Acute Rehab OT Goals Patient Stated Goal: to rest OT Goal Formulation: With patient Time For Goal Achievement: 10/28/20 Potential to Achieve Goals: [place] Discharge plan remains appropriate;Frequency needs to be updated       AM-PAC OT "6 Clicks" Daily Activity     Outcome Measure  Help from another person eating meals?: None Help from another person taking care of personal grooming?: A Little Help from another person toileting, which includes using toliet, bedpan, or urinal?: Total Help from another person bathing (including washing, rinsing, drying)?: A Lot Help from another person to put on and taking off regular upper body clothing?: A Little Help from another person to put on and taking off regular lower body clothing?: Total 6 Click Score: 14    End of Session    OT Visit Diagnosis: Unsteadiness on feet (R26.81);Muscle weakness  (generalized) (M62.81);Pain   Activity Tolerance Patient limited by fatigue   Patient Left in bed;with call bell/phone within reach           Time: 1535-1600 OT Time Calculation (min): 25 min  Charges: OT General Charges $OT Visit: 1 Visit OT Treatments $Self Care/Home Management : 23-37 mins  Darleen Crocker, MS, OTR/L , CBIS ascom [phone]  10/14/20, 4:31 PM

## 2020-10-15 DIAGNOSIS — K315 Obstruction of duodenum: Secondary | ICD-10-CM | POA: Diagnosis not present

## 2020-10-15 DIAGNOSIS — R140 Abdominal distension (gaseous): Secondary | ICD-10-CM | POA: Diagnosis not present

## 2020-10-15 DIAGNOSIS — R1012 Left upper quadrant pain: Secondary | ICD-10-CM | POA: Diagnosis not present

## 2020-10-15 DIAGNOSIS — I480 Paroxysmal atrial fibrillation: Secondary | ICD-10-CM | POA: Diagnosis not present

## 2020-10-15 LAB — CBC
HCT: 31.4 % — ABNORMAL LOW (ref 36.0–46.0)
Hemoglobin: 10.4 g/dL — ABNORMAL LOW (ref 12.0–15.0)
MCH: 31 pg (ref 26.0–34.0)
MCHC: 33.1 g/dL (ref 30.0–36.0)
MCV: 93.5 fL (ref 80.0–100.0)
Platelets: 221 10*3/uL (ref 150–400)
RBC: 3.36 MIL/uL — ABNORMAL LOW (ref 3.87–5.11)
RDW: 13.7 % (ref 11.5–15.5)
WBC: 8.4 10*3/uL (ref 4.0–10.5)
nRBC: 0 % (ref 0.0–0.2)

## 2020-10-15 LAB — COMPREHENSIVE METABOLIC PANEL
ALT: 7 U/L (ref 0–44)
AST: 11 U/L — ABNORMAL LOW (ref 15–41)
Albumin: 2.1 g/dL — ABNORMAL LOW (ref 3.5–5.0)
Alkaline Phosphatase: 43 U/L (ref 38–126)
Anion gap: 8 (ref 5–15)
BUN: 28 mg/dL — ABNORMAL HIGH (ref 8–23)
CO2: 24 mmol/L (ref 22–32)
Calcium: 8.5 mg/dL — ABNORMAL LOW (ref 8.9–10.3)
Chloride: 104 mmol/L (ref 98–111)
Creatinine, Ser: 1.01 mg/dL — ABNORMAL HIGH (ref 0.44–1.00)
GFR, Estimated: 56 mL/min — ABNORMAL LOW (ref >60)
Glucose, Bld: 106 mg/dL — ABNORMAL HIGH (ref 70–99)
Potassium: 3.8 mmol/L (ref 3.5–5.1)
Sodium: 136 mmol/L (ref 135–145)
Total Bilirubin: 0.3 mg/dL (ref 0.3–1.2)
Total Protein: 5.3 g/dL — ABNORMAL LOW (ref 6.5–8.1)

## 2020-10-15 LAB — GLUCOSE, CAPILLARY
Glucose-Capillary: 105 mg/dL — ABNORMAL HIGH (ref 70–99)
Glucose-Capillary: 101 mg/dL — ABNORMAL HIGH (ref 70–99)

## 2020-10-15 LAB — PHOSPHORUS: Phosphorus: 3.4 mg/dL (ref 2.5–4.6)

## 2020-10-15 LAB — MAGNESIUM: Magnesium: 1.7 mg/dL (ref 1.7–2.4)

## 2020-10-15 MED ORDER — SALINE SPRAY 0.65 % NA SOLN
1.0000 | NASAL | Status: DC | PRN
  Administered 2020-10-16: 10:00:00 1 via NASAL
  Filled 2020-10-15 (×2): qty 44

## 2020-10-15 MED ORDER — TRAVASOL 10 % IV SOLN
INTRAVENOUS | Status: AC
  Filled 2020-10-15: qty 818.4

## 2020-10-15 NOTE — Progress Notes (Signed)
PROGRESS NOTE  Cathy Solis QMV:[id number] DOB: 1941-03-30 DOA: 10/06/2020 PCP: Park Liter P, DO   LOS: 9 days   Brief narrative: As per HPI,  Cathy Solis a 79 y.o.femalewith past medical history of hypertension, hypothyroidism, chronic atrial fibrillation on Eliquis, COPD, solitary kidney, chronic hydronephrosis and bilateral carotid stenosis presented to the ED with complaints of acute abdominal pain. Patient was recently hospitalized on 09/27/2020 and discharged on 10/05/2020 for acute pyelonephritis. Plan was to follow-up with urology, cardiology and primary care provider as outpatient. She did have acute pyelonephritis with acute kidney injury at that time and urine culture showed E. coli. She was also seen by cardiology for atrial fibrillation with RVR and Eliquis was started for CHA2DS2-VASc score of 6. Patient was also discharged home on home oxygen at 2 L/min. This time, patient was admitted to hospital for evaluation of abdominal pain.  Assessment/Plan:  Principal Problem:   Abdominal pain Active Problems:   Hypothyroidism   Hydronephrosis   Tobacco abuse   Hypertension   Severe protein-calorie malnutrition (HCC)   Atrial fibrillation (HCC)   Abdominal distention   Duodenal obstruction  Abdominal pain. Likely multifactorial from duodenal compression versus small bowel obstruction due to hernia, stool impaction versus peptic ulcer disease, esophagitis.  Improved at this time.  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  GI and general surgery on board.   Clinically nonacute abdomen.  Small bowel follow-through exam was requested by GI yesterday.  Abdominal x-ray shows decreased gaseous distention of small and large bowel with a small amount of contrast in the pelvic and right lower quadrant small bowel. Continue IV  Protonix twice daily, currently on TPN due to inadequate oral intake.  Patient was also seen by urology during hospitalization for findings of hydronephrosis on the CT scan.  It has recommended advancing the diet.  Will start patient on clears today and advance to full liquids by evening for dinner.  Spoke with patient's son at bedside.  History of COPD.  Was discharged home on 2 L of oxygen on last admission.  Will likely need on discharge.  Continue bronchodilators as needed.  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  As per CT scan of the abdomen.  General surgery on board, will follow recommendations.  Unlikely to be needing any surgical intervention.  Will advance diet as per surgical recommendation.  Patient feels hungry and has bowel sounds.  Acute kidney injury vs CKD -improving. Continue to hold lisinopril. Latest creatinine of 0.9.  Acute vs Recurrent Pyelonephritis -urine culture negative at this time. Was recently admitted for acute pyelonephritis. Urology recommends 2 weeks of antibiotics.   Off Foley catheter at this time.  Left hydronephrosis -chronic. Seen by urology.  Status post Left ureteral stent placement with Dr. Diamantina Providence on 10/08/20.   Continue IV antibiotic. Off Foley catheter. Urology planning for long-term stent exchanges every 6 to 12 months. Plan is to follow-up with urology clinic in 1 month. Continue IV antibiotics with Rocephin for now.  Constipation -continue as needed laxatives for now.  Oral thrush  continue magic mouthwash with lidocaine  Atrial fibrillation. Rate controlled. Continue Eliquis, metoprolol. Continue telemetry monitor.  Essential hypertension -lisinopril on hold. Continue metoprolol. As needed hydralazine.   Hypothyroidism -TSH within normal limits. Continue Synthroid.  Severe protein calorie malnutrition -present on admission. Dietitian on board. High risk of refeeding syndrome. Monitor electrolytes  closely.  Continue TPN.  Generalized weakness  -recommendation is skilled nursing facility placement on discharge. Seen by physical and Occupational Therapy.   Tobacco use disorder -does not wish nicotine patch. She does smoke 1-2 ppd, but declines NRT.   DVT prophylaxis: apixaban (ELIQUIS) tablet 2.5 mg Start: 10/08/20 2200 SCDs Start: 10/06/20 1336 apixaban (ELIQUIS) tablet 2.5 mg    Code Status: Full code  Family Communication: Spoke with the patient's son at bedside.   Status is: Inpatient  Remains inpatient appropriate because:IV treatments appropriate due to intensity of illness or inability to take PO, Inpatient level of care appropriate due to severity of illness and Persistent GI symptoms, on TPN    Dispo:  Patient From: [facility]  Planned Disposition: [place]  Expected discharge date: 2-3 days  Medically stable for discharge: No   Consultants:  GI  Urology  General surgery  Procedures:  NG tube placement and removal  Ureteral stent placement on 10/08/20  Antibiotics:  . Rocephin IV >10/07/2020  Anti-infectives (From admission, onward)   Start     Dose/Rate Route Frequency Ordered Stop   10/07/20 1530  cefTRIAXone (ROCEPHIN) 1 g in sodium chloride 0.9 % 100 mL IVPB        1 g 200 mL/hr over 30 Minutes Intravenous Every 24 hours 10/07/20 1435         Subjective: Today,  Patient was seen and examined at bedside.  Patient's son at bedside.  Patient denies any nausea vomiting abdominal pain.  Feels hungry and wishes to eat.  Objective: Vitals:   10/14/20 2333 10/15/20 0722  BP: (!) 124/54 (!) 151/72  Pulse: (!) 56 63  Resp: 20 18  Temp: 98.6 F (37 C) 98.4 F (36.9 C)  SpO2: 98% 98%    Intake/Output Summary (Last 24 hours) at 10/15/2020 0902 Last data filed at 10/14/2020 1100 Gross per 24 hour  Intake --  Output 200 ml  Net -200 ml   Filed Weights   10/06/20 0822  Weight: 51.3 kg   Body mass index is 18.82 kg/m.   Physical Exam: GENERAL:  Patient is alert awake and communicative, not in obvious distress, cachectic, on nasal cannula oxygen HENT: No scleral pallor or icterus. Pupils equally reactive to light. Oral mucosa is moist NECK: is supple, no gross swelling noted. CHEST:   Diminished breath sounds bilaterally.  There are breath sounds with CVS: S1 and S2 heard, no murmur. Regular rate and rhythm.  ABDOMEN: Soft, positive bowel sounds, non-tender, bowel sounds are present.  External urinary catheter. EXTREMITIES: No edema. CNS: Cranial nerves are intact. No focal motor deficits. SKIN: warm and dry without rashes.  Data Review: I have personally reviewed the following laboratory data and studies,  CBC: Recent Labs  Lab 10/09/20 0354 10/12/20 0507 10/15/20 0455  WBC 9.7 8.3 8.4  HGB 11.5* 9.3* 10.4*  HCT 34.5* 28.6* 31.4*  MCV 91.3 96.0 93.5  PLT 287 224 130   Basic Metabolic Panel: Recent Labs  Lab 10/10/20 0423 10/10/20 0423 10/11/20 0625 10/12/20 0559 10/13/20 0438 10/14/20 0455 10/15/20 0455  NA 133*   < > 133* 134* 133* 133* 136  K 3.7   < > 3.2* 3.7 3.8 3.8 3.8  CL 98   < > 99 104 103 103 104  CO2 27   < > 24 21* 22 23 24   GLUCOSE 100*   < > 104* 106* 99 114* 106*  BUN 25*   < > 25* 23 26*  24* 28*  CREATININE 1.36*   < > 1.32* 1.23* 1.10* 0.98 1.01*  CALCIUM 8.3*   < > 8.1* 8.0* 8.0* 8.3* 8.5*  MG 2.1   < > 1.9 1.7 2.1 1.8 1.7  PHOS 2.2*  --  2.5 2.5  --  2.9 3.4   < > = values in this interval not displayed.   Liver Function Tests: Recent Labs  Lab 10/12/20 0559 10/15/20 0455  AST 13* 11*  ALT 7 7  ALKPHOS 35* 43  BILITOT 0.4 0.3  PROT 5.0* 5.3*  ALBUMIN 1.9* 2.1*   No results for input(s): LIPASE, AMYLASE in the last 168 hours. No results for input(s): AMMONIA in the last 168 hours. Cardiac Enzymes: No results for input(s): CKTOTAL, CKMB, CKMBINDEX, TROPONINI in the last 168 hours. BNP (last 3 results) No results for input(s): BNP in the last 8760 hours.  ProBNP (last 3  results) No results for input(s): PROBNP in the last 8760 hours.  CBG: Recent Labs  Lab 10/14/20 0509 10/14/20 0824 10/14/20 1814 10/14/20 2310 10/15/20 0747  GLUCAP 117* 108* 112* 123* 105*   Recent Results (from the past 240 hour(s))  SARS Coronavirus 2 by RT PCR (hospital order, performed in [facility] hospital lab) Nasopharyngeal Nasopharyngeal Swab     Status: None   Collection Time: 10/05/20  1:02 PM   Specimen: Nasopharyngeal Swab  Result Value Ref Range Status   SARS Coronavirus 2 NEGATIVE NEGATIVE Final    Comment: (NOTE) SARS-CoV-2 target nucleic acids are NOT DETECTED.  The SARS-CoV-2 RNA is generally detectable in upper and lower respiratory specimens during the acute phase of infection. The lowest concentration of SARS-CoV-2 viral copies this assay can detect is 250 copies / mL. A negative result does not preclude SARS-CoV-2 infection and should not be used as the sole basis for treatment or other patient management decisions.  A negative result may occur with improper specimen collection / handling, submission of specimen other than nasopharyngeal swab, presence of viral mutation(s) within the areas targeted by this assay, and inadequate number of viral copies (<250 copies / mL). A negative result must be combined with clinical observations, patient history, and epidemiological information.  Fact Sheet for Patients:   StrictlyIdeas.no  Fact Sheet for Healthcare Providers: BankingDealers.co.za  This test is not yet approved or  cleared by the [place] FDA and has been authorized for detection and/or diagnosis of SARS-CoV-2 by FDA under an Emergency Use Authorization (EUA).  This EUA will remain in effect (meaning this test can be used) for the duration of the COVID-19 declaration under Section 564(b)(1) of the Act, 21 U.S.C. section 360bbb-3(b)(1), unless the authorization is terminated or revoked  sooner.  Performed at [facility], [address] [place], [place] [address]   Resp Panel by RT PCR (RSV, Flu A&B, Covid) - Urine, Clean Catch     Status: None   Collection Time: 10/06/20  1:50 PM   Specimen: Urine, Clean Catch  Result Value Ref Range Status   SARS Coronavirus 2 by RT PCR NEGATIVE NEGATIVE Final    Comment: (NOTE) SARS-CoV-2 target nucleic acids are NOT DETECTED.  The SARS-CoV-2 RNA is generally detectable in upper respiratoy specimens during the acute phase of infection. The lowest concentration of SARS-CoV-2 viral copies this assay can detect is 131 copies/mL. A negative result does not preclude SARS-Cov-2 infection and should not be used as the sole basis for treatment or other patient management decisions. A negative result may occur with  improper specimen collection/handling, submission of specimen other than nasopharyngeal swab, presence of viral mutation(s) within the areas targeted by this assay, and inadequate number of viral copies (<131 copies/mL). A negative result must be combined with clinical observations, patient history, and epidemiological information. The expected result is Negative.  Fact Sheet for Patients:  PinkCheek.be  Fact Sheet for Healthcare Providers:  GravelBags.it  This test is no t yet approved or cleared by the [place] FDA and  has been authorized for detection and/or diagnosis of SARS-CoV-2 by FDA under an Emergency Use Authorization (EUA). This EUA will remain  in effect (meaning this test can be used) for the duration of the COVID-19 declaration under Section 564(b)(1) of the Act, 21 U.S.C. section 360bbb-3(b)(1), unless the authorization is terminated or revoked sooner.     Influenza A by PCR NEGATIVE NEGATIVE Final   Influenza B by PCR NEGATIVE NEGATIVE Final    Comment: (NOTE) The Xpert Xpress SARS-CoV-2/FLU/RSV assay is intended as an aid in   the diagnosis of influenza from Nasopharyngeal swab specimens and  should not be used as a sole basis for treatment. Nasal washings and  aspirates are unacceptable for Xpert Xpress SARS-CoV-2/FLU/RSV  testing.  Fact Sheet for Patients: PinkCheek.be  Fact Sheet for Healthcare Providers: GravelBags.it  This test is not yet approved or cleared by the [place] FDA and  has been authorized for detection and/or diagnosis of SARS-CoV-2 by  FDA under an Emergency Use Authorization (EUA). This EUA will remain  in effect (meaning this test can be used) for the duration of the  Covid-19 declaration under Section 564(b)(1) of the Act, 21  U.S.C. section 360bbb-3(b)(1), unless the authorization is  terminated or revoked.    Respiratory Syncytial Virus by PCR NEGATIVE NEGATIVE Final    Comment: (NOTE) Fact Sheet for Patients: PinkCheek.be  Fact Sheet for Healthcare Providers: GravelBags.it  This test is not yet approved or cleared by the [place] FDA and  has been authorized for detection and/or diagnosis of SARS-CoV-2 by  FDA under an Emergency Use Authorization (EUA). This EUA will remain  in effect (meaning this test can be used) for the duration of the  COVID-19 declaration under Section 564(b)(1) of the Act, 21 U.S.C.  section 360bbb-3(b)(1), unless the authorization is terminated or  revoked. Performed at [facility], [address], [place], [place] [address]   Urine Culture     Status: None   Collection Time: 10/06/20  1:50 PM   Specimen: Urine, Random  Result Value Ref Range Status   Specimen Description   Final    URINE, RANDOM Performed at [facility], [address] [place], [place] [address]    Special Requests   Final    NONE Performed at [facility], [address], [place], [place] [address]    Culture    Final    NO GROWTH Performed at [facility], [place] [address] [place], [place] [address]    Report Status 10/08/2020 FINAL  Final     Studies: CT ABDOMEN PELVIS W CONTRAST  Result Date: 10/13/2020 CLINICAL DATA:  79 year old female with abdominal distension. EXAM: CT ABDOMEN AND PELVIS WITH CONTRAST TECHNIQUE: Multidetector CT imaging of the abdomen and pelvis was performed using the standard protocol following bolus administration of intravenous contrast. CONTRAST:  81mL OMNIPAQUE IOHEXOL 300 MG/ML  SOLN COMPARISON:  CT abdomen pelvis dated 10/06/2020. FINDINGS: Lower chest: Partially visualized small left and moderate right pleural effusion with near complete  compressive atelectasis of the right lower lobe. Pneumonia is not excluded clinical correlation is recommended. There is mild cardiomegaly with multi vessel coronary vascular calcification. No intra-abdominal free air. Small subhepatic ascites. Hepatobiliary: Slight irregularity of the liver contour may represent early changes of cirrhosis. Clinical correlation is recommended. There is mild intrahepatic biliary ductal dilatation versus mild periportal edema. There is gallstone. No pericholecystic fluid or evidence of acute cholecystitis by CT. Pancreas: Unremarkable. No pancreatic ductal dilatation or surrounding inflammatory changes. Spleen: Normal in size without focal abnormality. Adrenals/Urinary Tract: The adrenal glands unremarkable. 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 There has been interval placement of a left ureteral stent with proximal tip in the central upper pole collecting system and distal end within the urinary bladder. There is moderate left hydronephrosis, improved since the prior CT. Diffuse left renal cortical atrophy and areas of scarring. Apparent thickening of the urothelium of the left renal collecting system may be chronic or represent pyelonephritis. Correlation with urinalysis recommended. There is dilatation of the  left renal pelvis with overall slight interval improvement in the degree of hydronephrosis compared to the prior CT. The urinary bladder is grossly unremarkable. Stomach/Bowel: Mildly dilated air-filled loops of small bowel with transition in the lower abdomen in the midline (49/2 and 24/5). There is an area of mesenteric swirling and twisting of the small bowel loops which may represent a mesenteric volvulus, although an internal hernia is not excluded clinical correlation is recommended. There is no pneumatosis. Vascular/Lymphatic: Advanced aortoiliac atherosclerotic disease. The IVC is unremarkable. No portal venous gas. There is no adenopathy. Reproductive: Hysterectomy. No adnexal masses. Other: There is a small right inguinal hernia containing a short segment of small bowel without evidence of obstruction at this level. Musculoskeletal: Mild diffuse subcutaneous edema and anasarca. There is osteopenia with scoliosis and degenerative changes of the spine. No acute osseous pathology. IMPRESSION: 1. 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 Clinical correlation is recommended. No pneumatosis or portal venous gas. 2. Minimal interval improvement of the left hydronephrosis status post stent placement. There is findings concerning for left-sided pyelonephritis. Correlation with urinalysis recommended. 3. Cholelithiasis. 4. Partially visualized small left and moderate right pleural effusion with near complete compressive atelectasis of the right lower lobe. Pneumonia is not excluded clinical correlation is recommended. 5. Aortic Atherosclerosis (ICD10-I70.0). Electronically Signed   By: Anner Crete M.D.   On: 10/13/2020 19:11   DG Abd Portable 1V-Small Bowel Obstruction Protocol-initial, 8 hr delay  Result Date: 10/14/2020 CLINICAL DATA:  Bowel obstruction EXAM: PORTABLE ABDOMEN - 1 VIEW COMPARISON:  CT 10/13/2020, radiograph 10/12/2020  FINDINGS: Left-sided ureteral stent similar in position. Marked scoliosis of the spine. Mild gaseous dilatation of small and large bowel, stable to mildly decreased. Small amounts of report contrast probably within pelvic and right lower quadrant small bowel. No definitive colon contrast. IMPRESSION: Stable to mildly decreased gaseous dilatation of small and large bowel. Small amounts of contrast probably within pelvic and right lower quadrant small bowel. Electronically Signed   By: Donavan Foil M.D.   On: 10/14/2020 21:03      Flora Lipps, MD  [facility] 10/15/2020

## 2020-10-15 NOTE — Consult Note (Signed)
PHARMACY - TOTAL PARENTERAL NUTRITION CONSULT NOTE   Indication: concern for possible obstruction related to extrinsic compression of duodenum and inability to assess patient's tolerance of PO diet due to poor intake  Patient Measurements: Height: 5\' 5"  (165.1 cm) Weight: 51.3 kg (113 lb 1.5 oz) IBW/kg (Calculated) : 57 TPN AdjBW (KG): 51.3 Body mass index is 18.82 kg/m. Usual Weight: 51.5 kg  Assessment: Due to concern for possible obstruction related to extrinsic compression of duodenum and inability to assess patient's tolerance of PO diet due to poor intake, plan is for continuation of TPN.   Glucose / Insulin: BG 105-123, 0 units insulin given. A1c 5.9  Electrolytes:  Na 136, K+ 3.8, Phos 2.9 > 3.4, Mg 1.7 Renal: Scr 1.36 > 1.32>1.23>1.1>0.98 LFTs / TGs: WNL. Prealbumin / albumin: 2.1 Intake / Output; MIVF: NS @ 20, TPN @ 68ml/hr, I/O net +1345 11/23 GI Imaging: none, lbm 11/24 Surgeries / Procedures: 11/8 cystopscopy with stent placement  Central access: 11/20 TPN start date: 11/20  Nutritional Goals (per RD recommendation on 11/19): kCal: 1400-1600, Protein: 75-85, Fluid: 1.3-1.5 L/day Goal TPN rate is 62 mL/hr (provides 81.84 g of protein and 1532.24 kcals per day)  Current Nutrition:  TPN  Plan:  Continue TPN @ 62 mL/hr at 1800, plan to wean tomorrow if advancing diet. Discussed with Dr. Louanne Belton.  Electrolytes in TPN: 26mEq/L of Na, 2mEq/L of K, 46mEq/L of Ca, 42mEq/L of Mg, and 28mmol/L of Phos. Cl:Ac ratio 1:1 No additional K+, Mg or Phos replenishment warranted Add standard MVI and trace elements to TPN Initiate Sensitive q8h SSI and adjust as needed  Continue MIVF at 20 mL/hr at 1800 Monitor TPN labs on Mon/Thurs. CBC, Albumin, Phos, Mg, CMP.   Oswald Hillock, PharmD, BCPS 10/15/2020,10:01 AM

## 2020-10-15 NOTE — Progress Notes (Signed)
Palliative:  Consult received for goals of care discussion. Chart reviewed.  Discussed with Dr. Louanne Belton. He has just discussed plan of care with son, son in agreement. Patient with some improvement in condition. Being sensitive to the holiday and no urgent needs, will plan for formal goals of care discussion tomorrow 11/26. Plan to introduce palliative model and review advance care planning documents.   Juel Burrow, DNP, AGNP-C Palliative Medicine Team Team Phone # [phone]  Pager # [phone]  NO CHARGE

## 2020-10-16 DIAGNOSIS — K56609 Unspecified intestinal obstruction, unspecified as to partial versus complete obstruction: Secondary | ICD-10-CM | POA: Diagnosis not present

## 2020-10-16 DIAGNOSIS — Z515 Encounter for palliative care: Secondary | ICD-10-CM

## 2020-10-16 DIAGNOSIS — Z7189 Other specified counseling: Secondary | ICD-10-CM

## 2020-10-16 DIAGNOSIS — K315 Obstruction of duodenum: Secondary | ICD-10-CM | POA: Diagnosis not present

## 2020-10-16 DIAGNOSIS — R1012 Left upper quadrant pain: Secondary | ICD-10-CM | POA: Diagnosis not present

## 2020-10-16 DIAGNOSIS — I480 Paroxysmal atrial fibrillation: Secondary | ICD-10-CM | POA: Diagnosis not present

## 2020-10-16 DIAGNOSIS — E43 Unspecified severe protein-calorie malnutrition: Secondary | ICD-10-CM | POA: Diagnosis not present

## 2020-10-16 DIAGNOSIS — R140 Abdominal distension (gaseous): Secondary | ICD-10-CM | POA: Diagnosis not present

## 2020-10-16 LAB — BASIC METABOLIC PANEL
Anion gap: 9 (ref 5–15)
BUN: 27 mg/dL — ABNORMAL HIGH (ref 8–23)
CO2: 24 mmol/L (ref 22–32)
Calcium: 8.3 mg/dL — ABNORMAL LOW (ref 8.9–10.3)
Chloride: 102 mmol/L (ref 98–111)
Creatinine, Ser: 0.87 mg/dL (ref 0.44–1.00)
GFR, Estimated: >60 mL/min (ref >60)
Glucose, Bld: 109 mg/dL — ABNORMAL HIGH (ref 70–99)
Potassium: 3.7 mmol/L (ref 3.5–5.1)
Sodium: 135 mmol/L (ref 135–145)

## 2020-10-16 LAB — CBC
HCT: 29.9 % — ABNORMAL LOW (ref 36.0–46.0)
Hemoglobin: 9.7 g/dL — ABNORMAL LOW (ref 12.0–15.0)
MCH: 30.2 pg (ref 26.0–34.0)
MCHC: 32.4 g/dL (ref 30.0–36.0)
MCV: 93.1 fL (ref 80.0–100.0)
Platelets: 214 10*3/uL (ref 150–400)
RBC: 3.21 MIL/uL — ABNORMAL LOW (ref 3.87–5.11)
RDW: 13.4 % (ref 11.5–15.5)
WBC: 9.6 10*3/uL (ref 4.0–10.5)
nRBC: 0 % (ref 0.0–0.2)

## 2020-10-16 LAB — MAGNESIUM: Magnesium: 1.6 mg/dL — ABNORMAL LOW (ref 1.7–2.4)

## 2020-10-16 LAB — GLUCOSE, CAPILLARY
Glucose-Capillary: 100 mg/dL — ABNORMAL HIGH (ref 70–99)
Glucose-Capillary: 101 mg/dL — ABNORMAL HIGH (ref 70–99)
Glucose-Capillary: 105 mg/dL — ABNORMAL HIGH (ref 70–99)
Glucose-Capillary: 110 mg/dL — ABNORMAL HIGH (ref 70–99)

## 2020-10-16 LAB — PHOSPHORUS: Phosphorus: 3 mg/dL (ref 2.5–4.6)

## 2020-10-16 MED ORDER — TRAVASOL 10 % IV SOLN
INTRAVENOUS | Status: AC
  Filled 2020-10-16: qty 409.2

## 2020-10-16 MED ORDER — MAGNESIUM OXIDE 400 (241.3 MG) MG PO TABS
400.0000 mg | ORAL_TABLET | Freq: Two times a day (BID) | ORAL | Status: DC
  Administered 2020-10-16 – 2020-10-21 (×11): 400 mg via ORAL
  Filled 2020-10-16 (×12): qty 1

## 2020-10-16 MED ORDER — LISINOPRIL 10 MG PO TABS
10.0000 mg | ORAL_TABLET | Freq: Every day | ORAL | Status: DC
  Administered 2020-10-16 – 2020-10-21 (×6): 10 mg via ORAL
  Filled 2020-10-16 (×6): qty 1

## 2020-10-16 MED ORDER — MAGNESIUM SULFATE 2 GM/50ML IV SOLN
2.0000 g | Freq: Once | INTRAVENOUS | Status: AC
  Administered 2020-10-16: 10:00:00 2 g via INTRAVENOUS
  Filled 2020-10-16: qty 50

## 2020-10-16 MED ORDER — BOOST / RESOURCE BREEZE PO LIQD CUSTOM
1.0000 | Freq: Three times a day (TID) | ORAL | Status: DC
  Administered 2020-10-17 – 2020-10-18 (×2): 1 via ORAL

## 2020-10-16 MED ORDER — SODIUM CHLORIDE 0.9 % IV SOLN: INTRAVENOUS | Status: AC

## 2020-10-16 NOTE — Care Management Important Message (Signed)
Important Message  Patient Details  Name: Cathy Solis MRN: [id number] Date of Birth: [birth year]   Medicare Important Message Given:  Yes     [name] 10/16/2020, 12:00 PM

## 2020-10-16 NOTE — Progress Notes (Signed)
[facility] Liaison note:  New referral for [facility] outpatient Palliative program to follow post discharge received from Palliative NP Kathie Rhodes. TOC Meagan Hagwood made aware. Liaison to follow for disposition. Thank you for this referral. Flo Shanks BSN, RN, [place] [phone]

## 2020-10-16 NOTE — Progress Notes (Addendum)
PROGRESS NOTE  Cathy Solis ZOX:[id number] DOB: 30-Aug-1941 DOA: 10/06/2020 PCP: Park Liter P, DO   LOS: 10 days   Brief narrative: As per HPI,  Cathy Belling Pierceis a 79 y.o.femalewith past medical history of hypertension, hypothyroidism, chronic atrial fibrillation on Eliquis, COPD, solitary kidney, chronic hydronephrosis and bilateral carotid stenosis presented to the ED with complaints of acute abdominal pain. Patient was recently hospitalized on 09/27/2020 and discharged on 10/05/2020 for acute pyelonephritis. Plan was to follow-up with urology, cardiology and primary care provider as outpatient. She did have acute pyelonephritis with acute kidney injury at that time and urine culture showed E. coli. She was also seen by cardiology for atrial fibrillation with RVR and Eliquis was started for CHA2DS2-VASc score of 6. Patient was also discharged home on home oxygen at 2 L/min. This time, patient was admitted to hospital for evaluation of abdominal pain.  GI and general surgery were consulted.  Assessment/Plan:  Principal Problem:   Abdominal pain Active Problems:   Hypothyroidism   Hydronephrosis   Tobacco abuse   Hypertension   Severe protein-calorie malnutrition (HCC)   Atrial fibrillation (HCC)   Abdominal distention   Duodenal obstruction  Abdominal pain.  Improved at this time.  Thought to be multifactorial from duodenal compression versus small bowel obstruction due to hernia, stool impaction versus peptic ulcer disease, esophagitis.  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  GI and general surgery were consulted.  Clinically nonacute abdomen.  Small bowel follow-through exam was performed..  Abdominal x-ray shows decreased gaseous distention of small and large bowel with a small amount of contrast in the pelvic and right lower  quadrant small bowel. Continue IV Protonix twice daily, currently on TPN due to inadequate oral intake.  We will decrease her TPN doses as patient improves her intake.  General surgery recommend advancing diet.  Patient was also seen by urology during hospitalization for findings of hydronephrosis on the CT scan.    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  General surgery was consulted.  No acute abdomen clinically.  Advance diet as tolerated as per surgery.    History of COPD.  Will consider 2 L of oxygen on discharge as per last assessment.  Acute kidney injury. Latest creatinine of 0.9.  Resume lisinopril.  Acute vs Recurrent Pyelonephritis -urine culture negative at this time. Was recently admitted for acute pyelonephritis. Urology recommends 2 weeks of antibiotics.   Off Foley catheter at this time.  Left hydronephrosis -chronic. Seen by urology.  Status post Left ureteral stent placement with Dr. Diamantina Providence on 10/08/20.   Continue IV antibiotic. Off Foley catheter. Urology planning for long-term stent exchanges every 6 to 12 months. Plan is to follow-up with urology clinic in 1 month. Continue IV antibiotics with Rocephin for now.  Constipation -continue as needed laxatives for now.   Oral thrush  continue magic mouthwash with lidocaine  Atrial fibrillation. Rate controlled. Continue Eliquis, metoprolol. Continue telemetry monitor.  Essential hypertension -lisinopril on hold. Continue metoprolol. As needed hydralazine.   Hypothyroidism -TSH within normal limits. Continue Synthroid.  Hypomagnesemia.  Will give 2 g of IV magnesium sulfate today.  Add magnesium oxide  Severe protein calorie malnutrition -present on admission. Dietitian on board. High risk of refeeding syndrome. Monitor electrolytes closely.  Currently on TPN, will advance diet.  Closely monitor.  Generalized weakness -seen by physical therapy  who recommended skilled nursing facility placement on discharge.     Tobacco use disorder -does not wish nicotine patch. She does smoke 1-2 ppd, but declines nicotine patch.  DVT prophylaxis: apixaban (ELIQUIS) tablet 2.5 mg Start: 10/08/20 2200 SCDs Start: 10/06/20 1336 apixaban (ELIQUIS) tablet 2.5 mg    Code Status: Full code  Family Communication:  I again spoke with the patient's son at bedside and updated him about the clinical condition of the patient..   Status is: Inpatient  Remains inpatient appropriate because:IV treatments appropriate due to intensity of illness or inability to take PO, Inpatient level of care appropriate due to severity of illness , on TPN, awaiting for skilled nursing facility placement   Dispo:  Patient From: [facility]  Planned Disposition: [facility]  Expected discharge date: 1 to 2 days.  Will decrease TPN today, monitor closely for oral intake.  Medically stable for discharge: No   Consultants:  GI  Urology  General surgery  Procedures:  NG tube placement and removal  Ureteral stent placement on 10/08/20  Antibiotics:  . Rocephin IV >10/07/2020  Anti-infectives (From admission, onward)   Start     Dose/Rate Route Frequency Ordered Stop   10/07/20 1530  cefTRIAXone (ROCEPHIN) 1 g in sodium chloride 0.9 % 100 mL IVPB        1 g 200 mL/hr over 30 Minutes Intravenous Every 24 hours 10/07/20 1435 10/21/20 1529     Subjective:  Today, patient was seen and examined visible patient denies any nausea vomiting abdominal pain.  Has tolerated her p.o. diet.  Had some solid food this morning.  Was on full liquids yesterday.  Son at bedside stated that patient was able to eat a significant portion of the food.  Objective: Vitals:   10/15/20 1953 10/16/20 0007  BP: (!) 150/73 (!) 163/67  Pulse: 76 70  Resp: 20 (!) 22  Temp: 98.8 F (37.1 C) 98 F (36.7 C)  SpO2: 100% 98%    Intake/Output Summary (Last 24 hours) at 10/16/2020 0733 Last data filed at 10/16/2020  0300 Gross per 24 hour  Intake 1303.62 ml  Output 1400 ml  Net -96.38 ml   Filed Weights   10/06/20 0822 10/15/20 0500 10/16/20 0428  Weight: 51.3 kg 51.3 kg 56.1 kg   Body mass index is 20.58 kg/m.   Physical Exam: General: Thinly built, not in obvious distress alert awake and communicative HENT:   No scleral pallor or icterus noted. Oral mucosa is moist.  Chest:  Clear breath sounds.  Diminished breath sounds bilaterally.  CVS: S1 &S2 heard. No murmur.  Regular rate and rhythm. Abdomen: Soft, nontender, nondistended.  Bowel sounds are heard.  External urinary catheter in place Extremities: No cyanosis, clubbing or edema.  Peripheral pulses are palpable. Psych: Alert, awake and communicative, normal mood CNS:  No cranial nerve deficits.  Power equal in all extremities.  Moves all extremities. Skin: Warm and dry.  No rashes noted.   Data Review: I have personally reviewed the following laboratory data and studies,  CBC: Recent Labs  Lab 10/12/20 0507 10/15/20 0455 10/16/20 0435  WBC 8.3 8.4 9.6  HGB 9.3* 10.4* 9.7*  HCT 28.6* 31.4* 29.9*  MCV 96.0 93.5 93.1  PLT 224 221 314   Basic Metabolic Panel: Recent Labs  Lab 10/11/20 0625 10/11/20 0625 10/12/20 0559 10/13/20 0438 10/14/20 0455 10/15/20 0455 10/16/20 0435  NA 133*   < > 134* 133* 133* 136 135  K 3.2*   < >  3.7 3.8 3.8 3.8 3.7  CL 99   < > 104 103 103 104 102  CO2 24   < > 21* 22 23 24 24   GLUCOSE 104*   < > 106* 99 114* 106* 109*  BUN 25*   < > 23 26* 24* 28* 27*  CREATININE 1.32*   < > 1.23* 1.10* 0.98 1.01* 0.87  CALCIUM 8.1*   < > 8.0* 8.0* 8.3* 8.5* 8.3*  MG 1.9   < > 1.7 2.1 1.8 1.7 1.6*  PHOS 2.5  --  2.5  --  2.9 3.4 3.0   < > = values in this interval not displayed.   Liver Function Tests: Recent Labs  Lab 10/12/20 0559 10/15/20 0455  AST 13* 11*  ALT 7 7  ALKPHOS 35* 43  BILITOT 0.4 0.3  PROT 5.0* 5.3*  ALBUMIN 1.9* 2.1*   No results for input(s): LIPASE, AMYLASE in the last 168  hours. No results for input(s): AMMONIA in the last 168 hours. Cardiac Enzymes: No results for input(s): CKTOTAL, CKMB, CKMBINDEX, TROPONINI in the last 168 hours. BNP (last 3 results) No results for input(s): BNP in the last 8760 hours.  ProBNP (last 3 results) No results for input(s): PROBNP in the last 8760 hours.  CBG: Recent Labs  Lab 10/14/20 1814 10/14/20 2310 10/15/20 0747 10/15/20 1646 10/16/20 0008  GLUCAP 112* 123* 105* 101* 101*   Recent Results (from the past 240 hour(s))  Resp Panel by RT PCR (RSV, Flu A&B, Covid) - Urine, Clean Catch     Status: None   Collection Time: 10/06/20  1:50 PM   Specimen: Urine, Clean Catch  Result Value Ref Range Status   SARS Coronavirus 2 by RT PCR NEGATIVE NEGATIVE Final    Comment: (NOTE) SARS-CoV-2 target nucleic acids are NOT DETECTED.  The SARS-CoV-2 RNA is generally detectable in upper respiratoy specimens during the acute phase of infection. The lowest concentration of SARS-CoV-2 viral copies this assay can detect is 131 copies/mL. A negative result does not preclude SARS-Cov-2 infection and should not be used as the sole basis for treatment or other patient management decisions. A negative result may occur with  improper specimen collection/handling, submission of specimen other than nasopharyngeal swab, presence of viral mutation(s) within the areas targeted by this assay, and inadequate number of viral copies (<131 copies/mL). A negative result must be combined with clinical observations, patient history, and epidemiological information. The expected result is Negative.  Fact Sheet for Patients:  PinkCheek.be  Fact Sheet for Healthcare Providers:  GravelBags.it  This test is no t yet approved or cleared by the [place] FDA and  has been authorized for detection and/or diagnosis of SARS-CoV-2 by FDA under an Emergency Use Authorization (EUA). This EUA  will remain  in effect (meaning this test can be used) for the duration of the COVID-19 declaration under Section 564(b)(1) of the Act, 21 U.S.C. section 360bbb-3(b)(1), unless the authorization is terminated or revoked sooner.     Influenza A by PCR NEGATIVE NEGATIVE Final   Influenza B by PCR NEGATIVE NEGATIVE Final    Comment: (NOTE) The Xpert Xpress SARS-CoV-2/FLU/RSV assay is intended as an aid in  the diagnosis of influenza from Nasopharyngeal swab specimens and  should not be used as a sole basis for treatment. Nasal washings and  aspirates are unacceptable for Xpert Xpress SARS-CoV-2/FLU/RSV  testing.  Fact Sheet for Patients: PinkCheek.be  Fact Sheet for Healthcare Providers: GravelBags.it  This test is not yet  approved or cleared by the [place] and  has been authorized for detection and/or diagnosis of SARS-CoV-2 by  FDA under an Emergency Use Authorization (EUA). This EUA will remain  in effect (meaning this test can be used) for the duration of the  Covid-19 declaration under Section 564(b)(1) of the Act, 21  U.S.C. section 360bbb-3(b)(1), unless the authorization is  terminated or revoked.    Respiratory Syncytial Virus by PCR NEGATIVE NEGATIVE Final    Comment: (NOTE) Fact Sheet for Patients: PinkCheek.be  Fact Sheet for Healthcare Providers: GravelBags.it  This test is not yet approved or cleared by the [place] FDA and  has been authorized for detection and/or diagnosis of SARS-CoV-2 by  FDA under an Emergency Use Authorization (EUA). This EUA will remain  in effect (meaning this test can be used) for the duration of the  COVID-19 declaration under Section 564(b)(1) of the Act, 21 U.S.C.  section 360bbb-3(b)(1), unless the authorization is terminated or  revoked. Performed at [facility], [address],  [place], [place] [address]   Urine Culture     Status: None   Collection Time: 10/06/20  1:50 PM   Specimen: Urine, Random  Result Value Ref Range Status   Specimen Description   Final    URINE, RANDOM Performed at [facility], [address], [place], [place] [address]    Special Requests   Final    NONE Performed at [facility], [address], [place], [place] [address]    Culture   Final    NO GROWTH Performed at [facility], [place] [address] [place],  [address]    Report Status 10/08/2020 FINAL  Final     Studies: DG Abd Portable 1V-Small Bowel Obstruction Protocol-initial, 8 hr delay  Result Date: 10/14/2020 CLINICAL DATA:  Bowel obstruction EXAM: PORTABLE ABDOMEN - 1 VIEW COMPARISON:  CT 10/13/2020, radiograph 10/12/2020 FINDINGS: Left-sided ureteral stent similar in position. Marked scoliosis of the spine. Mild gaseous dilatation of small and large bowel, stable to mildly decreased. Small amounts of report contrast probably within pelvic and right lower quadrant small bowel. No definitive colon contrast. IMPRESSION: Stable to mildly decreased gaseous dilatation of small and large bowel. Small amounts of contrast probably within pelvic and right lower quadrant small bowel. Electronically Signed   By: Donavan Foil M.D.   On: 10/14/2020 21:03      Flora Lipps, MD  [facility] 10/16/2020

## 2020-10-16 NOTE — Consult Note (Signed)
Consultation Note Date: 10/16/2020   Patient Name: Cathy Solis  DOB: 11-Jun-1941  MRN: [id number]  Age / Sex: 79 y.o., female  PCP: Valerie Roys, DO Referring Physician: Flora Lipps, MD  Reason for Consultation: Establishing goals of care  HPI/Patient Profile: 79 y.o. female  with past medical history of hypertension, hypothyroidism, chronic atrial fibrillation on Eliquis, ongoing tobacco abuse, COPD not on home oxygen, solitary left kidney with chronic hydronephrosis (right nephrectomy), and bilateral carotid stenosis on ultrasound imaging January 2021 admitted on 10/06/2020 with acute abdominal pain. Patient hospitalized 11/7-11/15 for pyelonephritis.  GI and general surgery have been involved for abdominal pain. Abdominal x-ray shows decreased gaseous distention of small and large bowel with a small amount of contrast in the pelvic and right lower quadrant small bowel. Patient is not tolerating some PO intake but remains on TPN as well. PMT consulted to discuss [place].   Clinical Assessment and Goals of Care: I have reviewed medical records including EPIC notes, labs and imaging, received report from Dr. Louanne Belton, assessed the patient and then met with patient and son [name]  to discuss diagnosis prognosis, [place], EOL wishes, disposition and options.  I introduced Palliative Medicine as specialized medical care for people living with serious illness. It focuses on providing relief from the symptoms and stress of a serious illness. The goal is to improve quality of life for both the patient and the family.  Patient's interaction during conversation was limited. When she does speak she only states she does not want to go back to previous rehab facility. Son feels that patient has ability to make decisions for herself and wants her to continues to do so as long as she is able. Unsure if patient is able to comprehend situation/goals of care  discussion.  As far as function, son shares that prior to hospitalization patient was living independently and able to care for herself. She would sometimes use a cane for ambulation.   I attempted to elicit values and goals of care important to the patient. Son shares that ultimate goal is for patient to return home with prior level of function. He is hopeful patient will agree to some assistance at home.      I completed a MOST form today. When introducing MOST form, patient's son shares that patient has not been receptive to such conversations in the past and interprets the intent of these discussions as malicious. Introduced MOST form as way of medical team understanding patient's wishes.   The patient and family outlined their wishes for the following treatment decisions:  Cardiopulmonary Resuscitation: Attempt Resuscitation (CPR)  Medical Interventions: Full Scope of Treatment: Use intubation, advanced airway interventions, mechanical ventilation, cardioversion as indicated, medical treatment, IV fluids, etc, also provide comfort measures. Transfer to the hospital if indicated  Antibiotics: Antibiotics if indicated  IV Fluids: IV fluids if indicated  Feeding Tube: Feeding tube for a defined trial period   Son shares he may not agree with all of patient's decisions but as long as she is able to share her wishes he will abide by her wishes.   Discussed with patient/family the importance of continued conversation with family and the medical providers regarding overall plan of care and treatment options, ensuring decisions are within the context of the patient's values and GOCs.    Palliative Care services outpatient were explained and offered. Agreeable to palliative follow up at SNF.  Questions and concerns were addressed. The family was encouraged to call with questions or  concerns.   Primary Decision Maker PATIENT joined by son who is HCPOA  SUMMARY OF RECOMMENDATIONS    - MOST  completed as above - full scope/full code interventions - palliative to follow at [place]:  Full code   Discharge Planning: [place] for rehab with Palliative care service follow-up      Primary Diagnoses: Present on Admission: . Hypothyroidism . Hydronephrosis . Tobacco abuse . Hypertension . Atrial fibrillation ([place]) . Severe protein-calorie malnutrition ([place]) . Abdominal pain   I have reviewed the medical record, interviewed the patient and family, and examined the patient. The following aspects are pertinent.  Past Medical History:  Diagnosis Date  . Breast cancer [facility]) 2006   Left breast, s/p radiation  . 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 Hypertension   . Kidney problem    Undeveloped R kidney  . 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 Personal history of tobacco use, presenting hazards to health 03/18/2016  . Stroke [facility])    residual left sided weakness  . Toe infection    followed by Dr. Jens Som   Social History   Socioeconomic History  . Marital status: Widowed    Spouse name: Not on file  . Number of children: Not on file  . Years of education: Not on file  . Highest education level: High school graduate  Occupational History  . Not on file  Tobacco Use  . Smoking status: Current Some Day Smoker    Packs/day: 0.50    Years: 30.00    Pack years: 15.00    Types: Cigarettes    Last attempt to quit: 01/08/2019    Years since quitting: 1.7  . Smokeless tobacco: Never Used  Vaping Use  . Vaping Use: Never used  Substance and Sexual Activity  . Alcohol use: No  . Drug use: No  . Sexual activity: Never  Other Topics Concern  . Not on file  Social History Narrative  . Not on file   Social Determinants of Health   Financial Resource Strain: Low Risk   . Difficulty of Paying Living Expenses: Not hard at all  Food Insecurity: No Food Insecurity  . Worried About  Charity fundraiser in the Last Year: Never true  . Ran Out of Food in the Last Year: Never true  Transportation Needs: No Transportation Needs  . Lack of Transportation (Medical): No  . Lack of Transportation (Non-Medical): No  Physical Activity: Inactive  . Days of Exercise per Week: 0 days  . Minutes of Exercise per Session: 0 min  Stress: No Stress Concern Present  . Feeling of Stress : Not at all  Social Connections: Moderately Isolated  . Frequency of Communication with Friends and Family: More than three times a week  . Frequency of Social Gatherings with Friends and Family: More than three times a week  . Attends Religious Services: More than 4 times per year  . Active Member of Clubs or Organizations: No  . Attends Archivist Meetings: Never  . Marital Status: Widowed   Family History  Problem Relation Age of Onset  . Stroke Mother   . Heart disease Father   . Heart attack Maternal Grandfather    Scheduled Meds: . apixaban  2.5 mg Oral BID  . Chlorhexidine Gluconate Cloth  6 each Topical Daily  . fluticasone furoate-vilanterol  1 puff Inhalation Daily  . insulin aspart  0-9 Units Subcutaneous Q8H  . insulin aspart  0-9 Units Subcutaneous Once  . levothyroxine  150 mcg Per Tube Q0600  . magic mouthwash  5 mL Oral QID   And  . lidocaine  5 mL Mouth/Throat QID  . lisinopril  10 mg Oral Daily  . metoprolol tartrate  50 mg Per Tube BID  . ondansetron (ZOFRAN) IV  4 mg Intravenous Once  . pantoprazole (PROTONIX) IV  40 mg Intravenous Q12H  . polyethylene glycol  17 g Oral Daily  . sodium chloride flush  10-40 mL Intracatheter Q12H  . sucralfate  1 g Per Tube TID WC & HS   Continuous Infusions: . sodium chloride 75 mL/hr at 10/16/20 1006  . cefTRIAXone (ROCEPHIN)  IV Stopped (10/15/20 1652)  . TPN ADULT (ION) 62 mL/hr at 10/15/20 1641  . TPN ADULT (ION)     PRN Meds:.acetaminophen, albuterol, bisacodyl, bisacodyl, hydrALAZINE, iohexol,  ipratropium-albuterol, ondansetron (ZOFRAN) IV, phenol, sodium chloride, sodium chloride flush Allergies  Allergen Reactions  . Statins Other (See Comments)    Myalgias  . Zetia [Ezetimibe] Other (See Comments)    myalgias   Review of Systems  Constitutional: Positive for activity change and fatigue.    Physical Exam Constitutional:      General: She is not in acute distress. Pulmonary:     Effort: Pulmonary effort is normal.  Abdominal:     General: There is distension.  Skin:    General: Skin is warm and dry.  Neurological:     Mental Status: She is alert.     Comments: Orientation difficult to assess as her response to questions is limited     Vital Signs: BP (!) 152/68 (BP Location: Left Arm)   Pulse 61   Temp 98.4 F (36.9 C) (Oral)   Resp (!) 22   Ht '5\' 5"'  (1.651 m)   Wt 56.1 kg   SpO2 100%   BMI 20.58 kg/m  Pain Scale: 0-10   Pain Score: 0-No pain   SpO2: SpO2: 100 % O2 Device:SpO2: 100 % O2 Flow Rate: .O2 Flow Rate (L/min): 3 L/min  IO: Intake/output summary:   Intake/Output Summary (Last 24 hours) at 10/16/2020 1110 Last data filed at 10/16/2020 1050 Gross per 24 hour  Intake 1423.62 ml  Output 1400 ml  Net 23.62 ml    LBM: Last BM Date: 10/14/20 Baseline Weight: Weight: 51.3 kg Most recent weight: Weight: 56.1 kg     Palliative Assessment/Data: PPS 40%    Time Total: 60 minutes Greater than 50%  of this time was spent counseling and coordinating care related to the above assessment and plan.  Juel Burrow, DNP, AGNP-C Palliative Medicine Team [phone] Pager: [phone]

## 2020-10-16 NOTE — Progress Notes (Addendum)
Nutrition Follow-up  DOCUMENTATION CODES:   Severe malnutrition in context of chronic illness  INTERVENTION:  Continue TPN per pharmacy. Plan is to wean to 1/2 rate today.  Provide Boost Breeze po TID, each supplement provides 250 kcal and 9 grams of protein.  Provide Magic cup TID with meals, each supplement provides 290 kcal and 9 grams of protein. Patient prefers orange.  NUTRITION DIAGNOSIS:   Severe Malnutrition related to chronic illness (COPD, CKD) as evidenced by severe fat depletion, severe muscle depletion.  Ongoing.  GOAL:   Patient will meet greater than or equal to 90% of their needs  Previously met with TPN. Progressing with PO intake.  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 Diet was advanced to clear liquids on 11/25 AM, full liquids on 11/25 afternoon, and soft diet this morning.  Met with patient and her son at bedside. Patient sitting up in bed and eating lunch at time of RD assessment. Son reports patient had eggs, breakfast potatoes, yogurt, and Magic Cup at breakfast and ate 50% (approximately 331 kcal and 17 grams of protein). Patient reports she does not like the Ensure. Plan is to try Boost Breeze. Patient eating Magic Cup with lunch tray and enjoys these.  Medications reviewed and include: Novolog 0-9 units Q8hrs, levothyroxine, lisinopril, Protonix, Miralax, Carafate 1 gram TID and QHS, ceftriaxone.  Labs reviewed: CBG 100-110, BUN 27, Magnesium 1.6. Triglycerides 124 on 11/21.  IV Access: right brachial double lumen PICC placed 11/19  TPN: Travasol 10% 55 grams/L, dextrose 15%, SMOFlipid 30 grams/L at 62 mL/hr (1532 kcal, 81.8 grams amino acids)  Discussed with RN. Discussed with Pharmacy this AM and MD via secure chat. Plan is to begin weaning TPN today.  Diet Order:   Diet Order            DIET SOFT Room service appropriate? Yes; Fluid consistency: Thin  Diet effective now                EDUCATION NEEDS:   No education needs have been identified at this time  Skin:  Skin Assessment: Skin Integrity Issues: Skin Integrity Issues:: Other (Comment) Other: blanchable red area on bottom per RN documentation  Last BM:  11/24- type 7  Height:   Ht Readings from Last 1 Encounters:  10/06/20 '5\' 5"'  (1.651 m)   Weight:   Wt  Readings from Last 1 Encounters:  10/16/20 56.1 kg   Ideal Body Weight:  56.8 kg  BMI:  Body mass index is 20.58 kg/m.  Estimated Nutritional Needs:   Kcal:  1400-1600  Protein:  75-85 grams  Fluid:  1.3-1.5 L/day  Jacklynn Barnacle, MS, RD, LDN Pager number available on Amion

## 2020-10-16 NOTE — Consult Note (Signed)
PHARMACY - TOTAL PARENTERAL NUTRITION CONSULT NOTE   Indication: concern for possible obstruction related to extrinsic compression of duodenum and inability to assess patient's tolerance of PO diet due to poor intake  Patient Measurements: Height: 5\' 5"  (165.1 cm) Weight: 56.1 kg (123 lb 10.9 oz) IBW/kg (Calculated) : 57 TPN AdjBW (KG): 51.3 Body mass index is 20.58 kg/m. Usual Weight: 51.5 kg  Assessment:   Glucose / Insulin: BG 100-109, 0 units insulin given. A1c 5.9  Electrolytes:  Na 135, K+ 3.7, Phos 3.0, Mg 1.6 Renal: Scr 1.36 > 1.32>1.23>1.1>0.98>>0.87 LFTs / TGs: WNL Prealbumin / albumin: 2.1 Intake / Output; MIVF: NS @ 20, TPN @ 70ml/hr, I/O net -96.4 11/25 GI Imaging: none, lbm 11/24 Surgeries / Procedures: 11/8 cystopscopy with stent placement  Central access: 11/20 TPN start date: 11/20  Nutritional Goals (per RD recommendation on 11/19): kCal: 1400-1600, Protein: 75-85, Fluid: 1.3-1.5 L/day Goal TPN rate is 62 mL/hr (provides 81.84 g of protein and 1532.24 kcals per day)   Current Nutrition:  TPN  Plan:  Will reduce to 1/2 rate per progressing/tolerating po diet  - TPN @ 31 mL/hr at 1800  Discussed with Dr. Louanne Belton and [place].   Electrolytes in TPN: 65mEq/L of Na, 52mEq/L of K, 44mEq/L of Ca, 72mEq/L of Mg, and 42mmol/L of Phos. Cl:Ac ratio 1:1  Mg 1.6 - repleted with 2g IV bolus x 1  No additional K+ or Phos replenishment warranted Add standard MVI and trace elements to TPN Initiate Sensitive q8h SSI and adjust as needed  MIVF at 75 mL/hr x 24 hours starting 11/26 @ 0830 per primary team Monitor TPN labs on Mon/Thurs. CBC, Albumin, Phos, Mg, CMP.   Lu Duffel, PharmD, BCPS 10/16/2020,10:08 AM

## 2020-10-16 NOTE — Progress Notes (Signed)
Physical Therapy Treatment Patient Details Name: Cathy Solis MRN: [id number] DOB: 1941/06/09 Today's Date: 10/16/2020    History of Present Illness Cathy Solis is an 57yoF who comes to [facility] on 11/16 from [facility] (recently DC from here to there <12 hours prior). Pt brought in with ABD pain, N/V. PMH: AF on eliquis, upper GIB. Pt recently in hospital for UTI. Pt underwent Left UPJ stenting. on 11/18.    PT Comments    Pt incontinent of loose stool upon arrival, Rolling L <> R with use of side rail and MinA.  Pt transferred ide to sit at [facility] with rail and ModA, MinA to maintain sitting balance with feet on floor. Pt transferred sit to stand with ModA, ModA as well to side step to bedside chair with [place] for balance, no assistive device.  Pt assisted to chair.  No c/o pain, positive increased fatigue with activity.  Pt remains very weak on 3L O2 this day.  Declined gait training due to fatigue. Pt positioned in chair to comfort with chair alarm, call bell in reach, and friend visiting in room  Follow Up Recommendations  SNF;Supervision for mobility/OOB;Supervision/Assistance - 24 hour     Equipment Recommendations  None recommended by PT    Recommendations for Other Services       Precautions / Restrictions Precautions Precautions: Fall Restrictions Weight Bearing Restrictions: No    Mobility  Bed Mobility Overal bed mobility: Needs Assistance Bed Mobility: Supine to Sit;Sit to Supine Rolling: Min assist            Transfers Overall transfer level: Needs assistance   Transfers: Sit to/from Stand;Stand Pivot Transfers Sit to Stand: Mod assist Stand pivot transfers: Mod assist       General transfer comment: Pt weak today with increased difficulty with mobility  Ambulation/Gait                 Stairs             Wheelchair Mobility    Modified Rankin (Stroke Patients Only)       Balance                                             Cognition Arousal/Alertness: Awake/alert Behavior During Therapy: WFL for tasks assessed/performed Overall Cognitive Status: Within Functional Limits for tasks assessed Area of Impairment: Orientation;Memory                                      Exercises      General Comments General comments (skin integrity, edema, etc.): Pt incontinent of urine and loose stool      Pertinent Vitals/Pain Pain Assessment: No/denies pain    Home Living                      Prior Function            PT Goals (current goals can now be found in the care plan section) Acute Rehab PT Goals Patient Stated Goal: To get better    Frequency    Min 2X/week      PT Plan Current plan remains appropriate    Co-evaluation              AM-PAC PT "6 Clicks" Mobility  Outcome Measure  Help needed turning from your back to your side while in a flat bed without using bedrails?: A Little Help needed moving from lying on your back to sitting on the side of a flat bed without using bedrails?: A Little Help needed moving to and from a bed to a chair (including a wheelchair)?: A Lot Help needed standing up from a chair using your arms (e.g., wheelchair or bedside chair)?: A Lot Help needed to walk in hospital room?: A Lot Help needed climbing 3-5 steps with a railing? : Total 6 Click Score: 13    End of Session         PT Visit Diagnosis: Muscle weakness (generalized) (M62.81);Difficulty in walking, not elsewhere classified (R26.2);Unsteadiness on feet (R26.81)     Time: 1600-1620 PT Time Calculation (min) (ACUTE ONLY): 20 min  Charges:                        Mikel Cella, PTA   Cathy Solis 10/16/2020, 4:38 PM

## 2020-10-17 DIAGNOSIS — I480 Paroxysmal atrial fibrillation: Secondary | ICD-10-CM | POA: Diagnosis not present

## 2020-10-17 DIAGNOSIS — R140 Abdominal distension (gaseous): Secondary | ICD-10-CM | POA: Diagnosis not present

## 2020-10-17 DIAGNOSIS — R1012 Left upper quadrant pain: Secondary | ICD-10-CM | POA: Diagnosis not present

## 2020-10-17 DIAGNOSIS — K315 Obstruction of duodenum: Secondary | ICD-10-CM | POA: Diagnosis not present

## 2020-10-17 LAB — BASIC METABOLIC PANEL
Anion gap: 8 (ref 5–15)
BUN: 30 mg/dL — ABNORMAL HIGH (ref 8–23)
CO2: 22 mmol/L (ref 22–32)
Calcium: 8.3 mg/dL — ABNORMAL LOW (ref 8.9–10.3)
Chloride: 104 mmol/L (ref 98–111)
Creatinine, Ser: 1.01 mg/dL — ABNORMAL HIGH (ref 0.44–1.00)
GFR, Estimated: 56 mL/min — ABNORMAL LOW (ref >60)
Glucose, Bld: 99 mg/dL (ref 70–99)
Potassium: 4.1 mmol/L (ref 3.5–5.1)
Sodium: 134 mmol/L — ABNORMAL LOW (ref 135–145)

## 2020-10-17 LAB — GLUCOSE, CAPILLARY
Glucose-Capillary: 100 mg/dL — ABNORMAL HIGH (ref 70–99)
Glucose-Capillary: 81 mg/dL (ref 70–99)
Glucose-Capillary: 90 mg/dL (ref 70–99)

## 2020-10-17 LAB — PHOSPHORUS: Phosphorus: 3.2 mg/dL (ref 2.5–4.6)

## 2020-10-17 LAB — MAGNESIUM: Magnesium: 2.1 mg/dL (ref 1.7–2.4)

## 2020-10-17 MED ORDER — POLYETHYLENE GLYCOL 3350 17 G PO PACK: 17.0000 g | PACK | Freq: Every day | ORAL | Status: DC | PRN

## 2020-10-17 MED ORDER — TRAVASOL 10 % IV SOLN
INTRAVENOUS | Status: DC
  Filled 2020-10-17: qty 409.2

## 2020-10-17 NOTE — Consult Note (Signed)
PHARMACY - TOTAL PARENTERAL NUTRITION CONSULT NOTE   Indication: concern for possible obstruction related to extrinsic compression of duodenum and inability to assess patient's tolerance of PO diet due to poor intake  Patient Measurements: Height: 5\' 5"  (165.1 cm) Weight: 56.1 kg (123 lb 10.9 oz) IBW/kg (Calculated) : 57 TPN AdjBW (KG): 51.3 Body mass index is 20.58 kg/m. 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  Glucose / Insulin: BG 99 -109, 0 units insulin given. A1c 5.9  Electrolytes:  WNL Renal: Scr 1.56 >> 1.01 LFTs / TGs: WNL Prealbumin / albumin: 2.1 g/dL Intake / Output; MIVF: no MIVF Recent GI Imaging: 11/24 KUB Stable to mildly decreased gaseous dilatation of small and large bowel Surgeries / Procedures: 11/8 cystopscopy with stent placement  Central access: 10/10/20 TPN start date: 10/10/20  Nutritional Goals (per RD recommendation on 11/19): kCal: 1400-1600, Protein: 75-85, Fluid: 1.3-1.5 L/day Goal TPN rate is 62 mL/hr (provides 81.84 g of protein and 1532.24 kcals per day)   Current Nutrition:  Soft , fluid consistency  Plan:   Continue TPN at 31 mL/hr (844 mL/24h)  Nutritional components:  Amino acids: 55 g/L (40.9 g)  Dextrose: 15 % (111.6 g)  Lipids: 30 g/L (22.4g)  Electrolytes in TPN (standard): 66mEq/L of Na, 63mEq/L of K, 41mEq/L of Ca, 42mEq/L of Mg, and 59mmol/L of Phos. Cl:Ac ratio 1:1  Add standard MVI and trace elements to TPN  No  SSI required in 6 days: Stop SSI   Monitor TPN labs on Mon/Thurs. CBC, Albumin, Phos, Mg, CMP.   Dallie Piles, PharmD, BCPS 10/17/2020,9:30 AM

## 2020-10-17 NOTE — Progress Notes (Signed)
PROGRESS NOTE  Cathy Solis DOB: 08/04/1941 DOA: 10/06/2020 PCP: Cathy Liter P, DO   LOS: 11 days   Brief narrative: As per HPI,  Cathy Kloosterman Pierceis a 79 y.o.femalewith past medical history of hypertension, hypothyroidism, chronic atrial fibrillation on Eliquis, COPD, solitary kidney, chronic hydronephrosis and bilateral carotid stenosis presented to the ED with complaints of acute abdominal pain. Patient was recently hospitalized on 09/27/2020 and discharged on 10/05/2020 for acute pyelonephritis. Plan was to follow-up with urology, cardiology and primary care provider as outpatient. She did have acute pyelonephritis with acute kidney injury at that time and urine culture showed E. coli. She was also seen by cardiology for atrial fibrillation with RVR and Eliquis was started for CHA2DS2-VASc score of 6. Patient was also discharged home on home oxygen at 2 L/min. This time, patient was admitted to hospital for evaluation of abdominal pain.  GI and general surgery were consulted.  Assessment/Plan:  Principal Problem:   Abdominal pain Active Problems:   Hypothyroidism   Hydronephrosis   Tobacco abuse   Hypertension   Severe protein-calorie malnutrition (HCC)   Atrial fibrillation (HCC)   Abdominal distention   Duodenal obstruction   SBO (small bowel obstruction) (HCC)   Goals of care, counseling/discussion   Palliative care by specialist  Abdominal pain.  Improved.hought to be multifactorial from duodenal compression versus small bowel obstruction due to hernia, stool impaction versus peptic ulcer disease, esophagitis.  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  GI and general surgery were consulted.  Patient is clinically improved.  Abdominal x-ray shows decreased gaseous distention of small and large bowel with a small  amount of contrast in the pelvic and right lower quadrant small bowel.  Was able to tolerate oral diet.  Will advance as tolerated.  Continue Protonix twice daily, continue to wean TPN.  General surgery recommend advancing diet.    History of COPD.  We will continue to wean oxygen if possible.  Was consider 2 L of oxygen on last admission.  Acute kidney injury. Latest creatinine of 1.0.  Resumed lisinopril.  Acute vs Recurrent Pyelonephritis -urine culture negative at this time. Was recently admitted for acute pyelonephritis. Urology recommends 2 weeks of antibiotics.   Off Foley catheter at this time.  Rocephin initiated 10/07/2020.  Left hydronephrosis -chronic. Seen by urology.  Status post Left ureteral stent placement with Dr. Diamantina Solis on 10/08/20.   Continue IV antibiotic. Off Foley catheter. Urology planning for long-term stent exchanges every 6 to 12 months. Plan is to follow-up with urology clinic in 1 month. Continue IV antibiotics with Rocephin for now.  Constipation -continue as needed laxatives for now.   Oral thrush  Continue magic mouthwash with lidocaine  Atrial fibrillation. Rate controlled. Continue Eliquis, metoprolol. Continue telemetry monitor.  Essential hypertension -lisinopril on hold. Continue metoprolol. As needed hydralazine.   Hypothyroidism -TSH within normal limits. Continue Synthroid.  Hypomagnesemia.  Received IV magnesium sulfate yesterday.  On oral magnesium oxide.  Magnesium level of 2.1 today  Severe protein calorie malnutrition -present on admission. Dietitian on board. High risk of refeeding syndrome. Monitor electrolytes closely.  Currently on TPN but we will continue to wean off, will advance diet.  Closely monitor.  Generalized weakness -physical therapy recommend skilled nursing facility placement on discharge.    Tobacco use disorder -does not wish nicotine patch. She does smoke 1-2 ppd,  but declines nicotine patch.  DVT prophylaxis:  apixaban (ELIQUIS) tablet 2.5 mg Start: 10/08/20 2200 SCDs Start: 10/06/20 1336 apixaban (ELIQUIS) tablet 2.5 mg    Code Status: Full code  Family Communication:    I again spoke with the patient's daughter at bedside and updated him about the clinical condition of the patient..   Status is: Inpatient  Remains inpatient appropriate because:IV treatments appropriate due to intensity of illness or inability to take PO, Inpatient level of care appropriate due to severity of illness , on TPN, awaiting for skilled nursing facility placement   Dispo:  Patient From: [facility]  Planned Disposition: [place]  Expected discharge date: 1 to 2 days.  Continue to wean TPN, monitor closely for oral intake.  Medically stable for discharge: Yes  Consultants:  GI  Urology  General surgery  Procedures:  NG tube placement and removal  Ureteral stent placement on 10/08/20  Antibiotics:  . Rocephin IV >10/07/2020  Anti-infectives (From admission, onward)   Start     Dose/Rate Route Frequency Ordered Stop   10/07/20 1530  cefTRIAXone (ROCEPHIN) 1 g in sodium chloride 0.9 % 100 mL IVPB        1 g 200 mL/hr over 30 Minutes Intravenous Every 24 hours 10/07/20 1435 10/21/20 1529     Subjective:  Today, patient was seen and examined at bedside.  Family at bedside.  Reporting that the patient has been eating better.  No nausea vomiting abdominal pain fever or chills.   Objective: Vitals:   10/16/20 2158 10/16/20 2335  BP: 135/63 126/76  Pulse:  61  Resp:  17  Temp: 98.5 F (36.9 C) 98.6 F (37 C)  SpO2: 96% 97%    Intake/Output Summary (Last 24 hours) at 10/17/2020 0729 Last data filed at 10/17/2020 0000 Gross per 24 hour  Intake 1604.77 ml  Output --  Net 1604.77 ml   Filed Weights   10/06/20 0822 10/15/20 0500 10/16/20 0428  Weight: 51.3 kg 51.3 kg 56.1 kg   Body mass index is 20.58 kg/m.   Physical Exam:  General: Thinly built,  not in obvious distress alert awake and communicative HENT:   No scleral pallor or icterus noted. Oral mucosa is moist.  Chest:  Clear breath sounds.  Diminished breath sounds bilaterally.  CVS: S1 &S2 heard. No murmur.  Regular rate and rhythm. Abdomen: Soft, nontender, nondistended.  Bowel sounds are heard.  External urinary catheter in place Extremities: No cyanosis, clubbing or edema.  Peripheral pulses are palpable.  Right upper extremity PICC line in place Psych: Alert, awake and communicative, normal mood CNS:  No cranial nerve deficits.  Power equal in all extremities.  Moves all extremities. Skin: Warm and dry.  No rashes noted.  Data Review: I have personally reviewed the following laboratory data and studies,  CBC: Recent Labs  Lab 10/12/20 0507 10/15/20 0455 10/16/20 0435  WBC 8.3 8.4 9.6  HGB 9.3* 10.4* 9.7*  HCT 28.6* 31.4* 29.9*  MCV 96.0 93.5 93.1  PLT 224 221 144   Basic Metabolic Panel: Recent Labs  Lab 10/12/20 0559 10/12/20 0559 10/13/20 0438 10/14/20 0455 10/15/20 0455 10/16/20 0435 10/17/20 0429  NA 134*   < > 133* 133* 136 135 134*  K 3.7   < > 3.8 3.8 3.8 3.7 4.1  CL 104   < > 103 103 104 102 104  CO2 21*   < > 22 23 24 24 22   GLUCOSE 106*   < >  99 114* 106* 109* 99  BUN 23   < > 26* 24* 28* 27* 30*  CREATININE 1.23*   < > 1.10* 0.98 1.01* 0.87 1.01*  CALCIUM 8.0*   < > 8.0* 8.3* 8.5* 8.3* 8.3*  MG 1.7   < > 2.1 1.8 1.7 1.6* 2.1  PHOS 2.5  --   --  2.9 3.4 3.0 3.2   < > = values in this interval not displayed.   Liver Function Tests: Recent Labs  Lab 10/12/20 0559 10/15/20 0455  AST 13* 11*  ALT 7 7  ALKPHOS 35* 43  BILITOT 0.4 0.3  PROT 5.0* 5.3*  ALBUMIN 1.9* 2.1*   No results for input(s): LIPASE, AMYLASE in the last 168 hours. No results for input(s): AMMONIA in the last 168 hours. Cardiac Enzymes: No results for input(s): CKTOTAL, CKMB, CKMBINDEX, TROPONINI in the last 168 hours. BNP (last 3 results) No results for input(s):  BNP in the last 8760 hours.  ProBNP (last 3 results) No results for input(s): PROBNP in the last 8760 hours.  CBG: Recent Labs  Lab 10/16/20 0008 10/16/20 0811 10/16/20 1213 10/16/20 1633 10/17/20 0048  GLUCAP 101* 100* 110* 105* 90   No results found for this or any previous visit (from the past 240 hour(s)).   Studies: No results found.    Flora Lipps, MD  [facility] 10/17/2020

## 2020-10-18 DIAGNOSIS — R140 Abdominal distension (gaseous): Secondary | ICD-10-CM | POA: Diagnosis not present

## 2020-10-18 DIAGNOSIS — I480 Paroxysmal atrial fibrillation: Secondary | ICD-10-CM | POA: Diagnosis not present

## 2020-10-18 DIAGNOSIS — K315 Obstruction of duodenum: Secondary | ICD-10-CM | POA: Diagnosis not present

## 2020-10-18 DIAGNOSIS — R1012 Left upper quadrant pain: Secondary | ICD-10-CM | POA: Diagnosis not present

## 2020-10-18 LAB — CBC
HCT: 26.5 % — ABNORMAL LOW (ref 36.0–46.0)
Hemoglobin: 8.5 g/dL — ABNORMAL LOW (ref 12.0–15.0)
MCH: 30.1 pg (ref 26.0–34.0)
MCHC: 32.1 g/dL (ref 30.0–36.0)
MCV: 94 fL (ref 80.0–100.0)
Platelets: 203 10*3/uL (ref 150–400)
RBC: 2.82 MIL/uL — ABNORMAL LOW (ref 3.87–5.11)
RDW: 13.9 % (ref 11.5–15.5)
WBC: 7.1 10*3/uL (ref 4.0–10.5)
nRBC: 0 % (ref 0.0–0.2)

## 2020-10-18 LAB — BASIC METABOLIC PANEL
Anion gap: 5 (ref 5–15)
BUN: 29 mg/dL — ABNORMAL HIGH (ref 8–23)
CO2: 23 mmol/L (ref 22–32)
Calcium: 8.4 mg/dL — ABNORMAL LOW (ref 8.9–10.3)
Chloride: 104 mmol/L (ref 98–111)
Creatinine, Ser: 1.06 mg/dL — ABNORMAL HIGH (ref 0.44–1.00)
GFR, Estimated: 53 mL/min — ABNORMAL LOW (ref >60)
Glucose, Bld: 101 mg/dL — ABNORMAL HIGH (ref 70–99)
Potassium: 3.8 mmol/L (ref 3.5–5.1)
Sodium: 132 mmol/L — ABNORMAL LOW (ref 135–145)

## 2020-10-18 LAB — MAGNESIUM: Magnesium: 1.9 mg/dL (ref 1.7–2.4)

## 2020-10-18 MED ORDER — TRAVASOL 10 % IV SOLN
INTRAVENOUS | Status: DC
  Filled 2020-10-18: qty 409.2

## 2020-10-18 NOTE — Progress Notes (Signed)
PROGRESS NOTE  Cathy Solis SPQ:[id number] DOB: [birth year] DOA: 10/06/2020 PCP: Park Liter P, DO   LOS: 12 days   Brief narrative: As per HPI,  Cathy Solis a 79 y.o.femalewith past medical history of hypertension, hypothyroidism, chronic atrial fibrillation on Eliquis, COPD, solitary kidney, chronic hydronephrosis and bilateral carotid stenosis presented to the ED with complaints of acute abdominal pain. Patient was recently hospitalized on 09/27/2020 and discharged on 10/05/2020 for acute pyelonephritis. Plan was to follow-up with urology, cardiology and primary care provider as outpatient. She did have acute pyelonephritis with acute kidney injury at that time and urine culture showed E. coli. She was also seen by cardiology for atrial fibrillation with RVR and Eliquis was started for CHA2DS2-VASc score of 6. Patient was also discharged home on home oxygen at 2 L/min. This time, patient was admitted to hospital for evaluation of abdominal pain.  GI and general surgery were consulted.  Assessment/Plan:  Principal Problem:   Abdominal pain Active Problems:   Hypothyroidism   Hydronephrosis   Tobacco abuse   Hypertension   Severe protein-calorie malnutrition (HCC)   Atrial fibrillation (HCC)   Abdominal distention   Duodenal obstruction   SBO (small bowel obstruction) (HCC)   Goals of care, counseling/discussion   Palliative care by specialist  Abdominal pain.  Resolved at this time.  Initially was thought to be  multifactorial from duodenal compression versus small bowel obstruction due to hernia, stool impaction versus peptic ulcer disease, esophagitis.  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  GI and general surgery were consulted.  Patient has clinically improved.  Tolerating oral diet.  Was on TPN which has been  decreased.  Will discontinue TPN at this time.      History of COPD.  We will continue to wean oxygen if possible.  Was consider 2 L of oxygen on last admission.  Will likely need on discharge.  Acute kidney injury. Latest creatinine of 1.0.  Resumed lisinopril.  Will watch BMP closely.  Acute vs Recurrent Pyelonephritis -urine culture negative but the urology recommends 2 weeks of IV antibiotic.  Was recently admitted for acute pyelonephritis.    Off Foley catheter at this time.  Rocephin initiated 10/07/2020.  Left hydronephrosis -chronic. Seen by urology.  Status post Left ureteral stent placement with Dr. Diamantina Providence on 10/08/20.   Continue IV antibiotic. Off Foley catheter. Urology planning for long-term stent exchanges every 6 to 12 months. Plan is to follow-up with urology clinic in 1 month.  Continue total of 2 weeks of antibiotic.  Constipation -improved continue as needed laxatives    Oral thrush  Improved.  Continue magic mouthwash with lidocaine  Atrial fibrillation. Rate controlled. Continue Eliquis, metoprolol. Continue telemetry monitor.  Essential hypertension -lisinopril started.  Continue metoprolol. As needed hydralazine.   Hypothyroidism -TSH within normal limits. Continue Synthroid.  Hypomagnesemia.  Improved with replacement.  Latest  magnesium of 1.9  Severe protein calorie malnutrition -continue TPN encourage oral diet at this time.  Nutrition on board.  Generalized weakness -physical therapy recommend skilled nursing facility placement on discharge.    Tobacco use disorder -does not wish nicotine patch.  DVT prophylaxis: apixaban (ELIQUIS) tablet 2.5 mg Start: 10/08/20 2200 SCDs Start: 10/06/20 1336 apixaban (ELIQUIS) tablet 2.5 mg    Code Status: Full code  Family Communication:   I  spoke with one of the the  patient's daughter at bedside and updated him about the clinical condition of the patient..   Status is: Inpatient  Remains inpatient  appropriate because:IV treatments appropriate due to intensity of illness or inability to take PO, awaiting for skilled nursing facility placement   Dispo:  Patient From: [place]  Planned Disposition: [place]  Expected discharge date: 1 to 2 days.  DC TPN today ,   Medically stable for discharge: Yes  Consultants:  GI  Urology  General surgery  Procedures:  NG tube placement and removal  Ureteral stent placement on 10/08/20  Antibiotics:  . Rocephin IV >10/07/2020  Anti-infectives (From admission, onward)   Start     Dose/Rate Route Frequency Ordered Stop   10/07/20 1530  cefTRIAXone (ROCEPHIN) 1 g in sodium chloride 0.9 % 100 mL IVPB        1 g 200 mL/hr over 30 Minutes Intravenous Every 24 hours 10/07/20 1435 10/21/20 1529     Subjective:  Today, patient was seen and examined at bedside.  Has been eating better.  No nausea vomiting abdominal pain fever or chills.  Patient's family at bedside.   Objective: Vitals:   10/18/20 0051 10/18/20 0725  BP: (!) 136/57 (!) 145/68  Pulse: 68 70  Resp: 17 18  Temp: 97.6 F (36.4 C) 97.8 F (36.6 C)  SpO2: 99% 98%    Intake/Output Summary (Last 24 hours) at 10/18/2020 0928 Last data filed at 10/18/2020 0400 Gross per 24 hour  Intake 512.32 ml  Output --  Net 512.32 ml   Filed Weights   10/15/20 0500 10/16/20 0428 10/18/20 0500  Weight: 51.3 kg 56.1 kg 50.6 kg   Body mass index is 18.55 kg/m.   Physical Exam:  General: Thinly built, not in obvious distress, alert awake and communicative HENT:   No scleral pallor or icterus noted. Oral mucosa is moist.  Chest:  Clear breath sounds.  Diminished breath sounds bilaterally. No crackles or wheezes.  CVS: S1 &S2 heard. No murmur.  Regular rate and rhythm. Abdomen: Soft, nontender, nondistended.  Bowel sounds are heard.  External urinary catheter in place. Extremities: No cyanosis, clubbing or edema.  Peripheral pulses are palpable.   Right upper extremity PICC line in place. Psych: Alert, awake and communicative, normal mood CNS:  No cranial nerve deficits.  Moves all extremities. Skin: Warm and dry.  No rashes noted.   Data Review: I have personally reviewed the following laboratory data and studies,  CBC: Recent Labs  Lab 10/12/20 0507 10/15/20 0455 10/16/20 0435 10/18/20 0655  WBC 8.3 8.4 9.6 7.1  HGB 9.3* 10.4* 9.7* 8.5*  HCT 28.6* 31.4* 29.9* 26.5*  MCV 96.0 93.5 93.1 94.0  PLT 224 221 214 790   Basic Metabolic Panel: Recent Labs  Lab 10/12/20 0559 10/13/20 0438 10/14/20 0455 10/15/20 0455 10/16/20 0435 10/17/20 0429 10/18/20 0655  NA 134*   < > 133* 136 135 134* 132*  K 3.7   < > 3.8 3.8 3.7 4.1 3.8  CL 104   < > 103 104 102 104 104  CO2 21*   < > 23 24 24 22 23   GLUCOSE 106*   < > 114* 106* 109* 99 101*  BUN 23   < > 24* 28* 27* 30* 29*  CREATININE 1.23*   < > 0.98 1.01* 0.87 1.01* 1.06*  CALCIUM 8.0*   < > 8.3* 8.5* 8.3* 8.3* 8.4*  MG 1.7   < > 1.8 1.7 1.6* 2.1 1.9  PHOS 2.5  --  2.9 3.4 3.0 3.2  --    < > = values in this interval not displayed.   Liver Function Tests: Recent Labs  Lab 10/12/20 0559 10/15/20 0455  AST 13* 11*  ALT 7 7  ALKPHOS 35* 43  BILITOT 0.4 0.3  PROT 5.0* 5.3*  ALBUMIN 1.9* 2.1*   No results for input(s): LIPASE, AMYLASE in the last 168 hours. No results for input(s): AMMONIA in the last 168 hours. Cardiac Enzymes: No results for input(s): CKTOTAL, CKMB, CKMBINDEX, TROPONINI in the last 168 hours. BNP (last 3 results) No results for input(s): BNP in the last 8760 hours.  ProBNP (last 3 results) No results for input(s): PROBNP in the last 8760 hours.  CBG: Recent Labs  Lab 10/16/20 1213 10/16/20 1633 10/17/20 0048 10/17/20 0738 10/17/20 1240  GLUCAP 110* 105* 90 81 100*   No results found for this or any previous visit (from the past 240 hour(s)).   Studies: No results found.    Flora Lipps, MD  [facility] 10/18/2020

## 2020-10-18 NOTE — Consult Note (Signed)
PHARMACY - TOTAL PARENTERAL NUTRITION CONSULT NOTE   Indication: concern for possible obstruction related to extrinsic compression of duodenum and inability to assess patient's tolerance of PO diet due to poor intake  Patient Measurements: Height: 5\' 5"  (165.1 cm) Weight: 50.6 kg (111 lb 8 oz) IBW/kg (Calculated) : 57 TPN AdjBW (KG): 51.3 Body mass index is 18.55 kg/m. 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  Glucose / Insulin: BG controlled, insulin stopped for lack of need, A1c 5.9  Electrolytes:  WNL On oral magnesium oxide 400 mg BID Renal: Scr 1.56 >> 1.06 LFTs / TGs: WNL Prealbumin / albumin: 2.1 g/dL Intake / Output; MIVF: no MIVF Recent GI Imaging: 11/24 KUB Stable to mildly decreased gaseous dilatation of small and large bowel Surgeries / Procedures: 11/8 cystopscopy with stent placement  Central access: 10/10/20 TPN start date: 10/10/20  Nutritional Goals (per RD recommendation on 11/19): kCal: 1400-1600, Protein: 75-85, Fluid: 1.3-1.5 L/day Goal TPN rate is 62 mL/hr (provides 81.84 g of protein and 1532.24 kcals per day)   Current Nutrition:  Soft , fluid consistency  Plan:   Continue TPN at 31 mL/hr (844 mL/24h)  Nutritional components:  Amino acids: 55 g/L (40.9 g)  Dextrose: 15 % (111.6 g)  Lipids: 30 g/L (22.4g)  Electrolytes in TPN (standard): 60mEq/L of Na, 73mEq/L of K, 54mEq/L of Ca, 93mEq/L of Mg, and 70mmol/L of Phos. Cl:Ac ratio  1:1  Add standard MVI and trace elements to TPN  Monitor TPN labs on Mon/Thurs  Dallie Piles, PharmD, BCPS 10/18/2020,7:39 AM

## 2020-10-18 NOTE — Plan of Care (Signed)

## 2020-10-19 ENCOUNTER — Ambulatory Visit: Payer: Medicare HMO | Admitting: Family Medicine

## 2020-10-19 DIAGNOSIS — K56609 Unspecified intestinal obstruction, unspecified as to partial versus complete obstruction: Secondary | ICD-10-CM

## 2020-10-19 DIAGNOSIS — I480 Paroxysmal atrial fibrillation: Secondary | ICD-10-CM | POA: Diagnosis not present

## 2020-10-19 DIAGNOSIS — R140 Abdominal distension (gaseous): Secondary | ICD-10-CM | POA: Diagnosis not present

## 2020-10-19 DIAGNOSIS — R1012 Left upper quadrant pain: Secondary | ICD-10-CM | POA: Diagnosis not present

## 2020-10-19 DIAGNOSIS — K315 Obstruction of duodenum: Secondary | ICD-10-CM | POA: Diagnosis not present

## 2020-10-19 LAB — DIFFERENTIAL
Abs Immature Granulocytes: 0.02 10*3/uL (ref 0.00–0.07)
Basophils Absolute: 0 10*3/uL (ref 0.0–0.1)
Basophils Relative: 0 %
Eosinophils Absolute: 0.2 10*3/uL (ref 0.0–0.5)
Eosinophils Relative: 2 %
Immature Granulocytes: 0 %
Lymphocytes Relative: 15 %
Lymphs Abs: 1 10*3/uL (ref 0.7–4.0)
Monocytes Absolute: 0.7 10*3/uL (ref 0.1–1.0)
Monocytes Relative: 10 %
Neutro Abs: 4.9 10*3/uL (ref 1.7–7.7)
Neutrophils Relative %: 73 %

## 2020-10-19 LAB — COMPREHENSIVE METABOLIC PANEL
ALT: 13 U/L (ref 0–44)
AST: 18 U/L (ref 15–41)
Albumin: 2 g/dL — ABNORMAL LOW (ref 3.5–5.0)
Alkaline Phosphatase: 56 U/L (ref 38–126)
Anion gap: 6 (ref 5–15)
BUN: 24 mg/dL — ABNORMAL HIGH (ref 8–23)
CO2: 24 mmol/L (ref 22–32)
Calcium: 8.2 mg/dL — ABNORMAL LOW (ref 8.9–10.3)
Chloride: 104 mmol/L (ref 98–111)
Creatinine, Ser: 1.04 mg/dL — ABNORMAL HIGH (ref 0.44–1.00)
GFR, Estimated: 54 mL/min — ABNORMAL LOW (ref >60)
Glucose, Bld: 86 mg/dL (ref 70–99)
Potassium: 3.7 mmol/L (ref 3.5–5.1)
Sodium: 134 mmol/L — ABNORMAL LOW (ref 135–145)
Total Bilirubin: 0.4 mg/dL (ref 0.3–1.2)
Total Protein: 5.4 g/dL — ABNORMAL LOW (ref 6.5–8.1)

## 2020-10-19 LAB — CBC
HCT: 26.7 % — ABNORMAL LOW (ref 36.0–46.0)
Hemoglobin: 8.8 g/dL — ABNORMAL LOW (ref 12.0–15.0)
MCH: 31.1 pg (ref 26.0–34.0)
MCHC: 33 g/dL (ref 30.0–36.0)
MCV: 94.3 fL (ref 80.0–100.0)
Platelets: 214 10*3/uL (ref 150–400)
RBC: 2.83 MIL/uL — ABNORMAL LOW (ref 3.87–5.11)
RDW: 13.8 % (ref 11.5–15.5)
WBC: 6.8 10*3/uL (ref 4.0–10.5)
nRBC: 0 % (ref 0.0–0.2)

## 2020-10-19 LAB — MAGNESIUM: Magnesium: 1.9 mg/dL (ref 1.7–2.4)

## 2020-10-19 LAB — PREALBUMIN: Prealbumin: 13.8 mg/dL — ABNORMAL LOW (ref 18–38)

## 2020-10-19 LAB — TRIGLYCERIDES: Triglycerides: 101 mg/dL (ref <150)

## 2020-10-19 LAB — PHOSPHORUS: Phosphorus: 3.3 mg/dL (ref 2.5–4.6)

## 2020-10-19 NOTE — TOC Progression Note (Signed)
Transition of Care [facility]) - Progression Note    Patient Details  Name: Cathy Solis MRN: [id number] Date of Birth: Mar 30, 1941  [facility] of [facility]) CM/SW Contact  Shelbie Hutching, RN Phone Number: 10/19/2020, 2:01 PM  Clinical Narrative:     Patient has decided she does not want to go back to [facility] but will be agreeable to go to [facility].  [employer] notified and they will cancel authorization.   [facility] has offered a bed and will start [employer].   Expected Discharge Plan: [place] Barriers to Discharge: Continued Medical Work up  Expected Discharge Plan and Services Expected Discharge Plan: [place]   Discharge Planning Services: CM Consult Post Acute Care Choice: [place] Living arrangements for the past 2 months: Single Family Home                   DME Agency: NA       HH Arranged: NA           Social Determinants of Health (SDOH) Interventions    Readmission Risk Interventions No flowsheet data found.

## 2020-10-19 NOTE — TOC Progression Note (Signed)
Transition of Care [facility]) - Progression Note    Patient Details  Name: Cathy Solis MRN: [id number] Date of Birth: 1941/02/12  [facility]) CM/SW Contact  Shelbie Hutching, RN Phone Number: 10/19/2020, 10:36 AM  Clinical Narrative:    Patient is medically cleared for discharge to SNF.  Cathy Solis will accept patient for short term rehab.  Cathy Solis is starting insurance auth through [facility] today.     Expected Discharge Plan: [place] Barriers to Discharge: Continued Medical Work up  Expected Discharge Plan and Services Expected Discharge Plan: [place]   Discharge Planning Services: CM Consult Post Acute Care Choice: [place] Living arrangements for the past 2 months: Single Family Home                   DME Agency: NA       HH Arranged: NA           Social Determinants of Health (SDOH) Interventions    Readmission Risk Interventions No flowsheet data found.

## 2020-10-19 NOTE — Progress Notes (Signed)
PHARMACY CONSULT NOTE - FOLLOW UP  Pharmacy Consult for Electrolyte Monitoring and Replacement   Recent Labs: Potassium (mmol/L)  Date Value  10/19/2020 3.7  01/03/2014 3.6   Magnesium (mg/dL)  Date Value  10/19/2020 1.9   Calcium (mg/dL)  Date Value  10/19/2020 8.2 (L)   Calcium, Total (mg/dL)  Date Value  01/03/2014 10.2 (H)   Albumin (g/dL)  Date Value  10/19/2020 2.0 (L)  07/21/2020 3.7   Phosphorus (mg/dL)  Date Value  10/19/2020 3.3   Sodium (mmol/L)  Date Value  10/19/2020 134 (L)  09/03/2020 137  01/03/2014 134 (L)     Assessment:  79 year old female with PMHx of CVA with residual left sided weakness, HTN, breast cancer s/p left lumpectomy and XRT, hypothyroidism, COPD, CKD stge III, solitary left kidney with chronic hydronephrosis (s/p right nephrectomy in 2008/2009), paroxysmal A-fib, chronic bilateral carotid artery stenosis, 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 On TPN from 11/20 to 11/28. Pharmacy consulted to manage electrolytes.  Goal of Therapy:  Maintain electrolytes within range  Plan:  - Electrolytes are within range, no replacement needed at this time. - Follow up on AM labs.  Paulina Fusi, PharmD, BCPS 10/19/2020 10:02 AM

## 2020-10-19 NOTE — Progress Notes (Signed)
PROGRESS NOTE  Cathy Solis:[id number] DOB: May 27, 1941 DOA: 10/06/2020 PCP: [place] Liter P, DO   LOS: 13 days   Brief narrative: As per HPI,  Cathy Solis a 79 y.o.femalewith past medical history of hypertension, hypothyroidism, chronic atrial fibrillation on Eliquis, COPD, solitary kidney, chronic hydronephrosis and bilateral carotid stenosis presented to the ED with complaints of acute abdominal pain. Patient was recently hospitalized on 09/27/2020 and discharged on 10/05/2020 for acute pyelonephritis. Plan was to follow-up with urology, cardiology and primary care provider as outpatient. She did have acute pyelonephritis with acute kidney injury at that time and urine culture showed E. coli. She was also seen by cardiology for atrial fibrillation with RVR and Eliquis was started for CHA2DS2-VASc score of 6. Patient was also discharged home on home oxygen at 2 L/min. This time, patient was admitted to hospital for evaluation of abdominal pain.  GI and general surgery were consulted.  Assessment/Plan:  Principal Problem:   Abdominal pain Active Problems:   Hypothyroidism   Hydronephrosis   Tobacco abuse   Hypertension   Severe protein-calorie malnutrition (HCC)   Atrial fibrillation (HCC)   Abdominal distention   Duodenal obstruction   SBO (small bowel obstruction) (HCC)   Goals of care, counseling/discussion   Palliative care by specialist  Abdominal pain.  Resolved at this time.  Initially was thought to be  multifactorial from duodenal compression versus small bowel obstruction due to hernia, stool impaction versus peptic ulcer disease, esophagitis.  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  GI and general surgery were consulted.  Patient has clinically improved.  Tolerating oral diet.  Was on TPN which has been  discontinued at this time.   History of COPD.  We will continue to wean oxygen if possible.  Was consider 2 L of oxygen on last admission.  Will likely need on discharge.  Acute kidney injury. Latest creatinine of 1.04.  Lisinopril has been restarted.  Acute vs Recurrent Pyelonephritis -urine culture negative but the urology recommends 2 weeks of IV antibiotic.  Was recently admitted for acute pyelonephritis.    Off Foley catheter at this time.  Rocephin initiated 10/07/2020.  Left hydronephrosis -chronic. Seen by urology.  Status post Left ureteral stent placement with Dr. Diamantina Providence on 10/08/20.   Continue IV antibiotic. Off Foley catheter. Urology planning for long-term stent exchanges every 6 to 12 months. Plan is to follow-up with urology clinic in 1 month.  Continue total of 2 weeks of antibiotic.  Constipation -improved continue as needed laxatives    Oral thrush  Improved.  Continue magic mouthwash with lidocaine  Atrial fibrillation. Rate controlled. Continue Eliquis, metoprolol. Continue telemetry monitor.  Essential hypertension -lisinopril started.  Continue metoprolol. As needed hydralazine.   Hypothyroidism -TSH within normal limits. Continue Synthroid.  Hypomagnesemia.  Improved with replacement.  Latest  magnesium of 1.9  Severe protein calorie malnutrition -continue TPN encourage oral diet at this time.  Nutrition on board.  Generalized weakness -physical therapy recommend skilled nursing facility placement on discharge.    Tobacco use disorder -does not wish nicotine patch.  Long toenails.  Patient's son wished to podiatry consultation.  Spoke with podiatry on-call.  Recommend outpatient podiatry follow-up.  Updated the patient's son.  DVT prophylaxis: apixaban (ELIQUIS) tablet 2.5 mg Start: 10/08/20 2200 SCDs Start: 10/06/20 1336 apixaban (ELIQUIS) tablet 2.5 mg   Code Status: Full  code  Family Communication:  I spoke with the patient's son at bedside and  updated him about the clinical condition of the patient and the plan for disposition...  Status is: Inpatient  Remains inpatient appropriate because: Awaiting skilled nursing facility placement  Dispo:  Patient From: [place]  Planned Disposition: [place]  Expected discharge date: When bed available.  Updated transition of care.  Medically stable for discharge: Yes  Consultants:  GI  Urology  General surgery  Procedures:  NG tube placement and removal  Ureteral stent placement on 10/08/20  Antibiotics:  . Rocephin IV >10/07/2020  Anti-infectives (From admission, onward)   Start     Dose/Rate Route Frequency Ordered Stop   10/07/20 1530  cefTRIAXone (ROCEPHIN) 1 g in sodium chloride 0.9 % 100 mL IVPB        1 g 200 mL/hr over 30 Minutes Intravenous Every 24 hours 10/07/20 1435 10/21/20 1529     Subjective:  Today, patient was seen and examined at bedside.  Denies any abdominal pain, nausea, vomiting, fever or chills.  Has been tolerating oral diet.  Objective: Vitals:   10/19/20 0750 10/19/20 1227  BP: (!) 144/91 134/62  Pulse: 74 65  Resp: 20 18  Temp: 97.9 F (36.6 C) 98.3 F (36.8 C)  SpO2: 97% 98%    Intake/Output Summary (Last 24 hours) at 10/19/2020 1314 Last data filed at 10/18/2020 1842 Gross per 24 hour  Intake 120 ml  Output --  Net 120 ml   Filed Weights   10/15/20 0500 10/16/20 0428 10/18/20 0500  Weight: 51.3 kg 56.1 kg 50.6 kg   Body mass index is 18.55 kg/m.   Physical Exam:  General: Thinly built, not in obvious distress, alert awake and communicative, on nasal cannula oxygen HENT:   No scleral pallor or icterus noted. Oral mucosa is moist.  Chest:  Clear breath sounds.  Diminished breath sounds bilaterally. No crackles or wheezes.  CVS: S1 &S2 heard. No murmur.  Regular rate and rhythm. Abdomen: Soft, nontender, nondistended.  Bowel sounds are heard.  External urinary catheter in  place. Extremities: No cyanosis, clubbing or edema.  Peripheral pulses are palpable.  Right upper extremity PICC line in place.  Long toenails. Psych: Alert, awake and communicative, normal mood CNS:  No cranial nerve deficits.  Moves all extremities. Skin: Warm and dry.  No rashes noted.   Data Review: I have personally reviewed the following laboratory data and studies,  CBC: Recent Labs  Lab 10/15/20 0455 10/16/20 0435 10/18/20 0655 10/19/20 0505  WBC 8.4 9.6 7.1 6.8  NEUTROABS  --   --   --  4.9  HGB 10.4* 9.7* 8.5* 8.8*  HCT 31.4* 29.9* 26.5* 26.7*  MCV 93.5 93.1 94.0 94.3  PLT 221 214 203 195   Basic Metabolic Panel: Recent Labs  Lab 10/14/20 0455 10/14/20 0455 10/15/20 0455 10/16/20 0435 10/17/20 0429 10/18/20 0655 10/19/20 0505  NA 133*   < > 136 135 134* 132* 134*  K 3.8   < > 3.8 3.7 4.1 3.8 3.7  CL 103   < > 104 102 104 104 104  CO2 23   < > 24 24 22 23 24   GLUCOSE 114*   < > 106* 109* 99 101* 86  BUN 24*   < > 28* 27* 30* 29* 24*  CREATININE 0.98   < > 1.01* 0.87 1.01* 1.06* 1.04*  CALCIUM 8.3*   < > 8.5* 8.3* 8.3* 8.4* 8.2*  MG 1.8   < > 1.7 1.6* 2.1 1.9 1.9  PHOS 2.9  --  3.4 3.0 3.2  --  3.3   < > = values in this interval not displayed.   Liver Function Tests: Recent Labs  Lab 10/15/20 0455 10/19/20 0505  AST 11* 18  ALT 7 13  ALKPHOS 43 56  BILITOT 0.3 0.4  PROT 5.3* 5.4*  ALBUMIN 2.1* 2.0*   No results for input(s): LIPASE, AMYLASE in the last 168 hours. No results for input(s): AMMONIA in the last 168 hours. Cardiac Enzymes: No results for input(s): CKTOTAL, CKMB, CKMBINDEX, TROPONINI in the last 168 hours. BNP (last 3 results) No results for input(s): BNP in the last 8760 hours.  ProBNP (last 3 results) No results for input(s): PROBNP in the last 8760 hours.  CBG: Recent Labs  Lab 10/16/20 1213 10/16/20 1633 10/17/20 0048 10/17/20 0738 10/17/20 1240  GLUCAP 110* 105* 90 81 100*   No results found for this or any previous  visit (from the past 240 hour(s)).   Studies: No results found.    Flora Lipps, MD  [facility] 10/19/2020

## 2020-10-19 NOTE — Progress Notes (Signed)
OT Cancellation Note  Patient Details Name: Cathy Solis MRN: [id number] DOB: 10/06/1941   Cancelled Treatment:    Reason Eval/Treat Not Completed: Patient declined, no reason specified. Pt supine in bed with sister present in the room. Pt not making eye contact with therapist and refusing OT intervention. Pt also verbalized not having any nursing needs at this time. Pt remained in bed with all needs within reach. OT will re-attempt when pt is available.  Darleen Crocker, [place], OTR/L , CBIS ascom [phone]  10/19/20, 3:39 PM    10/19/2020, 3:39 PM

## 2020-10-19 NOTE — Care Management Important Message (Signed)
Important Message  Patient Details  Name: KIMMORA RISENHOOVER MRN: [id number] Date of Birth: 1940-11-30   Medicare Important Message Given:  Yes     Juliann Pulse A Dejanae Helser 10/19/2020, 11:12 AM

## 2020-10-19 NOTE — Plan of Care (Signed)

## 2020-10-20 ENCOUNTER — Telehealth: Payer: Self-pay

## 2020-10-20 DIAGNOSIS — R1012 Left upper quadrant pain: Secondary | ICD-10-CM | POA: Diagnosis not present

## 2020-10-20 DIAGNOSIS — R140 Abdominal distension (gaseous): Secondary | ICD-10-CM | POA: Diagnosis not present

## 2020-10-20 DIAGNOSIS — I480 Paroxysmal atrial fibrillation: Secondary | ICD-10-CM | POA: Diagnosis not present

## 2020-10-20 DIAGNOSIS — K315 Obstruction of duodenum: Secondary | ICD-10-CM | POA: Diagnosis not present

## 2020-10-20 LAB — BASIC METABOLIC PANEL
Anion gap: 9 (ref 5–15)
BUN: 20 mg/dL (ref 8–23)
CO2: 23 mmol/L (ref 22–32)
Calcium: 8.5 mg/dL — ABNORMAL LOW (ref 8.9–10.3)
Chloride: 100 mmol/L (ref 98–111)
Creatinine, Ser: 1.1 mg/dL — ABNORMAL HIGH (ref 0.44–1.00)
GFR, Estimated: 50 mL/min — ABNORMAL LOW (ref >60)
Glucose, Bld: 80 mg/dL (ref 70–99)
Potassium: 3.5 mmol/L (ref 3.5–5.1)
Sodium: 132 mmol/L — ABNORMAL LOW (ref 135–145)

## 2020-10-20 NOTE — Progress Notes (Signed)
PT Cancellation Note  Patient Details Name: LULAR LETSON MRN: [id number] DOB: 1941/09/23   Cancelled Treatment:    Reason Eval/Treat Not Completed: Other (comment) (Patient eating breakfast, PT to re-attempt as able.)   Lieutenant Diego PT, DPT 11:18 AM,10/20/20

## 2020-10-20 NOTE — Progress Notes (Signed)
PROGRESS NOTE  Cathy Solis JYN:[id number] DOB: 1940/11/29 DOA: 10/06/2020 PCP: Park Liter P, DO   LOS: 14 days   Brief narrative: As per HPI,  Cathy Shira Cathy Solis a 79 y.o.femalewith past medical history of hypertension, hypothyroidism, chronic atrial fibrillation on Eliquis, COPD, solitary kidney, chronic hydronephrosis and bilateral carotid stenosis presented to the ED with complaints of acute abdominal pain. Patient was recently hospitalized on 09/27/2020 and discharged on 10/05/2020 for acute pyelonephritis. Plan was to follow-up with urology, cardiology and primary care provider as outpatient. She did have acute pyelonephritis with acute kidney injury at that time and urine culture showed E. coli. She was also seen by cardiology for atrial fibrillation with RVR and Eliquis was started for CHA2DS2-VASc score of 6. Patient was also discharged home on home oxygen at 2 L/min. This time, patient was admitted to hospital for evaluation of abdominal pain.  GI and general surgery were consulted.    Patient was treated conservatively and has improved at this time.  Patient was briefly on TPN as well.  At this time she is stable and waiting for skilled nursing facility placement.  Assessment/Plan:  Principal Problem:   Abdominal pain Active Problems:   Hypothyroidism   Hydronephrosis   Tobacco abuse   Hypertension   Severe protein-calorie malnutrition (HCC)   Atrial fibrillation (HCC)   Abdominal distention   Duodenal obstruction   SBO (small bowel obstruction) (HCC)   Goals of care, counseling/discussion   Palliative care by specialist  Abdominal pain.  Resolved at this time.  Initially was thought to be  multifactorial from duodenal compression versus small bowel obstruction due to hernia, stool impaction versus peptic ulcer disease, esophagitis.  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  GI and general surgery were consulted.  Patient had no clinical signs of bowel obstruction.  See improved conservatively.  Surgery recommended advancing diet which she has tolerated.  She was on TPN for some time which has been discontinued at this time.   History of COPD.  We will continue to wean oxygen if possible.  Was consider 2 L of oxygen on last admission.  Will likely need on discharge.  Acute kidney injury. Latest creatinine of 1.04.  Lisinopril has been restarted.  Acute vs Recurrent Pyelonephritis -urine culture negative but the urology recommends 2 weeks of IV antibiotic.  Was recently admitted for acute pyelonephritis.    Off Foley catheter at this time.  Rocephin initiated 10/07/2020.  Left hydronephrosis -chronic. Seen by urology.  Status post Left ureteral stent placement with Dr. Diamantina Providence on 10/08/20.   Continue IV antibiotic. Off Foley catheter. Urology planning for long-term stent exchanges every 6 to 12 months. Plan is to follow-up with urology clinic in 1 month.  Continue total of 2 weeks of antibiotic.  Constipation -improved, continue as needed laxatives    Oral thrush  Improved.  Continue magic mouthwash with lidocaine  Atrial fibrillation. Rate controlled. Continue Eliquis, metoprolol. Continue telemetry monitor.  Essential hypertension -continue lisinopril and metoprolol.  Blood pressure seems to be stable.  Hypothyroidism -TSH within normal limits. Continue Synthroid.  Hypomagnesemia.  Improved with replacement.  Latest  magnesium of 1.9  Severe protein calorie malnutrition -off TPN at this time.  Encourage oral nutrition.  Generalized weakness -improving gradually.  Awaiting for skilled nursing facility placement at this time.    Tobacco use disorder -not  on nicotine patch.  Does not want it.  Long toenails.  Spoke with podiatry.  Recommend outpatient follow-up.[name]  Updated the patient's son  yesterday.  DVT prophylaxis: apixaban (ELIQUIS) tablet 2.5 mg Start: 10/08/20 2200 SCDs Start: 10/06/20 1336 apixaban (ELIQUIS) tablet 2.5 mg   Code Status: Full code  Family Communication:  I spoke with the patient's daughter at bedside and updated her about the clinical condition of the patient and the plan for disposition.  Status is: Inpatient  Remains inpatient appropriate because: Awaiting skilled nursing facility placement  Dispo:  Patient From: [place]  Planned Disposition: [facility]  Expected discharge date: When bed available.  Communicated at the interdisciplinary meeting again today  Medically stable for discharge: Yes  Consultants:  GI  Urology  General surgery  Procedures:  NG tube placement and removal  Ureteral stent placement on 10/08/20  Antibiotics:  . Rocephin IV >10/07/2020  Anti-infectives (From admission, onward)   Start     Dose/Rate Route Frequency Ordered Stop   10/07/20 1530  cefTRIAXone (ROCEPHIN) 1 g in sodium chloride 0.9 % 100 mL IVPB        1 g 200 mL/hr over 30 Minutes Intravenous Every 24 hours 10/07/20 1435 10/21/20 1529     Subjective: Today, patient was seen and examined at bedside.  Has been tolerating oral diet.  Denies any nausea vomiting fever chills or rigor.   Objective: Vitals:   10/20/20 0735 10/20/20 1119  BP: (!) 133/59 (!) 142/69  Pulse: 69 70  Resp: 16 17  Temp: 98.6 F (37 C) 97.9 F (36.6 C)  SpO2: 99% 100%    Intake/Output Summary (Last 24 hours) at 10/20/2020 1500 Last data filed at 10/20/2020 1419 Gross per 24 hour  Intake 240 ml  Output --  Net 240 ml   Filed Weights   10/15/20 0500 10/16/20 0428 10/18/20 0500  Weight: 51.3 kg 56.1 kg 50.6 kg   Body mass index is 18.55 kg/m.   Physical Exam:  General: Thinly built, alert awake communicative, on nasal cannula oxygen, not in obvious distress HENT:   No scleral pallor or icterus noted. Oral mucosa is moist.   Chest: .  Diminished breath sounds bilaterally.  No wheezes noted. CVS: S1 &S2 heard. No murmur.  Regular rate and rhythm. Abdomen: Soft, nontender, nondistended.  Bowel sounds are heard.  External urinary catheter in place. Extremities: No cyanosis, clubbing or edema.  Peripheral pulses are palpable. Psych: Alert, awake and communicative,, normal mood CNS:  No cranial nerve deficits.  .  Generalized weakness noted Skin: Warm and dry.     Data Review: I have personally reviewed the following laboratory data and studies,  CBC: Recent Labs  Lab 10/15/20 0455 10/16/20 0435 10/18/20 0655 10/19/20 0505  WBC 8.4 9.6 7.1 6.8  NEUTROABS  --   --   --  4.9  HGB 10.4* 9.7* 8.5* 8.8*  HCT 31.4* 29.9* 26.5* 26.7*  MCV 93.5 93.1 94.0 94.3  PLT 221 214 203 625   Basic Metabolic Panel: Recent Labs  Lab 10/14/20 0455 10/14/20 0455 10/15/20 0455 10/15/20 0455 10/16/20 0435 10/17/20 0429 10/18/20 0655 10/19/20 0505 10/20/20 0517  NA 133*   < > 136   < > 135 134* 132* 134* 132*  K 3.8   < > 3.8   < > 3.7 4.1 3.8 3.7 3.5  CL 103   < > 104   < > 102 104 104 104 100  CO2 23   < >  24   < > 24 22 23 24 23   GLUCOSE 114*   < > 106*   < > 109* 99 101* 86 80  BUN 24*   < > 28*   < > 27* 30* 29* 24* 20  CREATININE 0.98   < > 1.01*   < > 0.87 1.01* 1.06* 1.04* 1.10*  CALCIUM 8.3*   < > 8.5*   < > 8.3* 8.3* 8.4* 8.2* 8.5*  MG 1.8   < > 1.7  --  1.6* 2.1 1.9 1.9  --   PHOS 2.9  --  3.4  --  3.0 3.2  --  3.3  --    < > = values in this interval not displayed.   Liver Function Tests: Recent Labs  Lab 10/15/20 0455 10/19/20 0505  AST 11* 18  ALT 7 13  ALKPHOS 43 56  BILITOT 0.3 0.4  PROT 5.3* 5.4*  ALBUMIN 2.1* 2.0*   No results for input(s): LIPASE, AMYLASE in the last 168 hours. No results for input(s): AMMONIA in the last 168 hours. Cardiac Enzymes: No results for input(s): CKTOTAL, CKMB, CKMBINDEX, TROPONINI in the last 168 hours. BNP (last 3 results) No results for input(s): BNP  in the last 8760 hours.  ProBNP (last 3 results) No results for input(s): PROBNP in the last 8760 hours.  CBG: Recent Labs  Lab 10/16/20 1213 10/16/20 1633 10/17/20 0048 10/17/20 0738 10/17/20 1240  GLUCAP 110* 105* 90 81 100*   No results found for this or any previous visit (from the past 240 hour(s)).   Studies: No results found.    Flora Lipps, MD  [facility] 10/20/2020

## 2020-10-20 NOTE — Progress Notes (Signed)
Physical Therapy Treatment Patient Details Name: Cathy Solis MRN: [id number] DOB: [birth year] Today's Date: 10/20/2020    History of Present Illness Cathy Solis is an 48yoF who comes [facility] on 11/16 from [facility] (recently DC from here to there <12 hours prior). Pt brought in with ABD pain, N/V. PMH: AF on eliquis, upper GIB. Pt recently in hospital for UTI. Pt underwent Left UPJ stenting. on 11/18.    PT Comments    Patient alert, did not speak much to PT during session but with family at bedside pt had motivation to perform OOB mobility today. Pt also adamant about performing as much mobility as possible on her own, mildly impulsive and perhaps limited insight to deficits. The patient did demonstrated improved activity tolerance and mobility compared to previous session, but still limited by poor activity tolerance, endurance, and unsteadiness. The patient performed bed mobility with supervision with use of bed rails. Sit <> stand twice with bilateral UE support, did not want to utilize RW. Primarily utilized bed rails and chair arms to transfer to recliner. Pt up in chair with all needs in reach at end of session. The patient would benefit from further skilled PT intervention to continue to progress towards goals. Recommendation remains appropriate.      Follow Up Recommendations  SNF;Supervision for mobility/OOB;Supervision/Assistance - 24 hour     Equipment Recommendations  None recommended by PT    Recommendations for Other Services       Precautions / Restrictions Precautions Precautions: Fall Restrictions Weight Bearing Restrictions: No    Mobility  Bed Mobility Overal bed mobility: Needs Assistance Bed Mobility: Supine to Sit     Supine to sit: Supervision;HOB elevated        Transfers Overall transfer level: Needs assistance Equipment used: 1 person hand held assist Transfers: Sit to/from Stand;Stand Pivot Transfers Sit to Stand: Min guard Stand  pivot transfers: Min guard       General transfer comment: Pt adamant about performing as independently as possible, utilized chair arm/bed rail to transfer, did not wait for gait belt to be donned  Ambulation/Gait                 Stairs             Wheelchair Mobility    Modified Rankin (Stroke Patients Only)       Balance Overall balance assessment: Needs assistance Sitting-balance support: Feet supported;Single extremity supported Sitting balance-Leahy Scale: Good     Standing balance support: Bilateral upper extremity supported Standing balance-Leahy Scale: Fair Standing balance comment: reliant on UE support                            Cognition Arousal/Alertness: Awake/alert Behavior During Therapy: WFL for tasks assessed/performed Overall Cognitive Status: Within Functional Limits for tasks assessed Area of Impairment: Orientation;Memory                               General Comments: minimally verbal, family at bedside very verbose, pt behavior and mental status appeared to be [facility] for tasks performed      Exercises      General Comments        Pertinent Vitals/Pain Pain Assessment: No/denies pain Pain Intervention(s): Limited activity within patient's tolerance;Monitored during session    Home Living  Prior Function            PT Goals (current goals can now be found in the care plan section) Progress towards PT goals: Progressing toward goals    Frequency    Min 2X/week      PT Plan Current plan remains appropriate    Co-evaluation              AM-PAC PT "6 Clicks" Mobility   Outcome Measure  Help needed turning from your back to your side while in a flat bed without using bedrails?: A Little Help needed moving from lying on your back to sitting on the side of a flat bed without using bedrails?: A Little Help needed moving to and from a bed to a chair (including  a wheelchair)?: A Lot Help needed standing up from a chair using your arms (e.g., wheelchair or bedside chair)?: A Lot Help needed to walk in hospital room?: A Lot Help needed climbing 3-5 steps with a railing? : Total 6 Click Score: 13    End of Session Equipment Utilized During Treatment: Oxygen Activity Tolerance: Patient tolerated treatment well Patient left: in chair;with call bell/phone within reach;with chair alarm set;with family/visitor present Nurse Communication: Mobility status PT Visit Diagnosis: Muscle weakness (generalized) (M62.81);Difficulty in walking, not elsewhere classified (R26.2);Unsteadiness on feet (R26.81)     Time: 4259-5638 PT Time Calculation (min) (ACUTE ONLY): 22 min  Charges:  $Therapeutic Exercise: 8-22 mins                     Lieutenant Diego PT, DPT 3:02 PM,10/20/20

## 2020-10-20 NOTE — Progress Notes (Signed)
PHARMACY CONSULT NOTE - FOLLOW UP  Pharmacy Consult for Electrolyte Monitoring and Replacement   Recent Labs: Potassium (mmol/L)  Date Value  10/20/2020 3.5  01/03/2014 3.6   Magnesium (mg/dL)  Date Value  10/19/2020 1.9   Calcium (mg/dL)  Date Value  10/20/2020 8.5 (L)   Calcium, Total (mg/dL)  Date Value  01/03/2014 10.2 (H)   Albumin (g/dL)  Date Value  10/19/2020 2.0 (L)  07/21/2020 3.7   Phosphorus (mg/dL)  Date Value  10/19/2020 3.3   Sodium (mmol/L)  Date Value  10/20/2020 132 (L)  09/03/2020 137  01/03/2014 134 (L)     Assessment:  79 year old female with PMHx of CVA with residual left sided weakness, HTN, breast cancer s/p left lumpectomy and XRT, hypothyroidism, COPD, CKD stge III, solitary left kidney with chronic hydronephrosis (s/p right nephrectomy in 2008/2009), paroxysmal A-fib, chronic bilateral carotid artery stenosis, 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 On TPN from 11/20 to 11/28. Pharmacy consulted to manage electrolytes.  Goal of Therapy:  Maintain electrolytes within range  Plan:  Patient on Mag-Ox 400mg  po BID - Electrolytes are within range, no replacement needed at this time. - Follow up on AM labs.  Chinita Greenland PharmD Clinical Pharmacist 10/20/2020

## 2020-10-20 NOTE — TOC Progression Note (Signed)
Transition of Care [facility]) - Progression Note    Patient Details  Name: Cathy Solis MRN: [id number] Date of Birth: 02-06-1941  [facility] CM/SW Contact  Shelbie Hutching, RN Phone Number: 10/20/2020, 4:30 PM  Clinical Narrative:    [place] approved authorization for SNF just now.  Can plan for discharge to [facility] tomorrow.    Expected Discharge Plan: [place] Barriers to Discharge: Continued Medical Work up  Expected Discharge Plan and Services Expected Discharge Plan: [place]   Discharge Planning Services: CM Consult Post Acute Care Choice: [place] Living arrangements for the past 2 months: Single Family Home                   DME Agency: NA       HH Arranged: NA           Social Determinants of Health (SDOH) Interventions    Readmission Risk Interventions No flowsheet data found.

## 2020-10-21 DIAGNOSIS — I959 Hypotension, unspecified: Secondary | ICD-10-CM | POA: Diagnosis not present

## 2020-10-21 DIAGNOSIS — I10 Essential (primary) hypertension: Secondary | ICD-10-CM | POA: Diagnosis not present

## 2020-10-21 DIAGNOSIS — R140 Abdominal distension (gaseous): Secondary | ICD-10-CM | POA: Diagnosis not present

## 2020-10-21 DIAGNOSIS — K315 Obstruction of duodenum: Secondary | ICD-10-CM | POA: Diagnosis not present

## 2020-10-21 DIAGNOSIS — M6281 Muscle weakness (generalized): Secondary | ICD-10-CM | POA: Diagnosis not present

## 2020-10-21 DIAGNOSIS — R1084 Generalized abdominal pain: Secondary | ICD-10-CM | POA: Diagnosis not present

## 2020-10-21 DIAGNOSIS — E43 Unspecified severe protein-calorie malnutrition: Secondary | ICD-10-CM | POA: Diagnosis not present

## 2020-10-21 DIAGNOSIS — Z7189 Other specified counseling: Secondary | ICD-10-CM | POA: Diagnosis not present

## 2020-10-21 DIAGNOSIS — I4891 Unspecified atrial fibrillation: Secondary | ICD-10-CM | POA: Diagnosis not present

## 2020-10-21 DIAGNOSIS — Q6211 Congenital occlusion of ureteropelvic junction: Secondary | ICD-10-CM | POA: Diagnosis not present

## 2020-10-21 DIAGNOSIS — R52 Pain, unspecified: Secondary | ICD-10-CM | POA: Diagnosis not present

## 2020-10-21 DIAGNOSIS — E039 Hypothyroidism, unspecified: Secondary | ICD-10-CM | POA: Diagnosis not present

## 2020-10-21 DIAGNOSIS — Z515 Encounter for palliative care: Secondary | ICD-10-CM | POA: Diagnosis not present

## 2020-10-21 DIAGNOSIS — Z7401 Bed confinement status: Secondary | ICD-10-CM | POA: Diagnosis not present

## 2020-10-21 DIAGNOSIS — R1012 Left upper quadrant pain: Secondary | ICD-10-CM | POA: Diagnosis not present

## 2020-10-21 DIAGNOSIS — R197 Diarrhea, unspecified: Secondary | ICD-10-CM | POA: Diagnosis not present

## 2020-10-21 DIAGNOSIS — E871 Hypo-osmolality and hyponatremia: Secondary | ICD-10-CM | POA: Diagnosis not present

## 2020-10-21 DIAGNOSIS — L600 Ingrowing nail: Secondary | ICD-10-CM | POA: Diagnosis not present

## 2020-10-21 DIAGNOSIS — I480 Paroxysmal atrial fibrillation: Secondary | ICD-10-CM | POA: Diagnosis not present

## 2020-10-21 DIAGNOSIS — D649 Anemia, unspecified: Secondary | ICD-10-CM | POA: Diagnosis not present

## 2020-10-21 DIAGNOSIS — Z905 Acquired absence of kidney: Secondary | ICD-10-CM | POA: Diagnosis not present

## 2020-10-21 DIAGNOSIS — N1330 Unspecified hydronephrosis: Secondary | ICD-10-CM | POA: Diagnosis not present

## 2020-10-21 DIAGNOSIS — M2550 Pain in unspecified joint: Secondary | ICD-10-CM | POA: Diagnosis not present

## 2020-10-21 DIAGNOSIS — E038 Other specified hypothyroidism: Secondary | ICD-10-CM | POA: Diagnosis not present

## 2020-10-21 DIAGNOSIS — J449 Chronic obstructive pulmonary disease, unspecified: Secondary | ICD-10-CM | POA: Diagnosis not present

## 2020-10-21 DIAGNOSIS — R69 Illness, unspecified: Secondary | ICD-10-CM | POA: Diagnosis not present

## 2020-10-21 DIAGNOSIS — G894 Chronic pain syndrome: Secondary | ICD-10-CM | POA: Diagnosis not present

## 2020-10-21 DIAGNOSIS — R498 Other voice and resonance disorders: Secondary | ICD-10-CM | POA: Diagnosis not present

## 2020-10-21 DIAGNOSIS — R488 Other symbolic dysfunctions: Secondary | ICD-10-CM | POA: Diagnosis not present

## 2020-10-21 DIAGNOSIS — K219 Gastro-esophageal reflux disease without esophagitis: Secondary | ICD-10-CM | POA: Diagnosis not present

## 2020-10-21 DIAGNOSIS — K56609 Unspecified intestinal obstruction, unspecified as to partial versus complete obstruction: Secondary | ICD-10-CM | POA: Diagnosis not present

## 2020-10-21 DIAGNOSIS — R2681 Unsteadiness on feet: Secondary | ICD-10-CM | POA: Diagnosis not present

## 2020-10-21 LAB — BASIC METABOLIC PANEL
Anion gap: 8 (ref 5–15)
BUN: 18 mg/dL (ref 8–23)
CO2: 24 mmol/L (ref 22–32)
Calcium: 8.3 mg/dL — ABNORMAL LOW (ref 8.9–10.3)
Chloride: 101 mmol/L (ref 98–111)
Creatinine, Ser: 1.12 mg/dL — ABNORMAL HIGH (ref 0.44–1.00)
GFR, Estimated: 49 mL/min — ABNORMAL LOW (ref >60)
Glucose, Bld: 86 mg/dL (ref 70–99)
Potassium: 3.1 mmol/L — ABNORMAL LOW (ref 3.5–5.1)
Sodium: 133 mmol/L — ABNORMAL LOW (ref 135–145)

## 2020-10-21 LAB — RESP PANEL BY RT-PCR (FLU A&B, COVID) ARPGX2
Influenza A by PCR: NEGATIVE
Influenza B by PCR: NEGATIVE
SARS Coronavirus 2 by RT PCR: NEGATIVE

## 2020-10-21 MED ORDER — SUCRALFATE 1 GM/10ML PO SUSP: 1.0000 g | Freq: Three times a day (TID) | ORAL | 0 refills | Status: DC

## 2020-10-21 MED ORDER — POTASSIUM CHLORIDE CRYS ER 20 MEQ PO TBCR
40.0000 meq | EXTENDED_RELEASE_TABLET | Freq: Once | ORAL | Status: AC
  Administered 2020-10-21: 09:00:00 40 meq via ORAL
  Filled 2020-10-21: qty 2

## 2020-10-21 MED ORDER — PHENOL 1.4 % MT LIQD: 1.0000 | OROMUCOSAL | 0 refills | Status: DC | PRN

## 2020-10-21 MED ORDER — ONDANSETRON HCL 4 MG PO TABS: 4.0000 mg | ORAL_TABLET | Freq: Three times a day (TID) | ORAL | 1 refills | Status: AC | PRN

## 2020-10-21 MED ORDER — MAGNESIUM OXIDE 400 (241.3 MG) MG PO TABS: 400.0000 mg | ORAL_TABLET | Freq: Two times a day (BID) | ORAL | 0 refills | Status: AC

## 2020-10-21 MED ORDER — POLYETHYLENE GLYCOL 3350 17 G PO PACK: 17.0000 g | PACK | Freq: Every day | ORAL | 0 refills | Status: DC | PRN

## 2020-10-21 MED ORDER — PANTOPRAZOLE SODIUM 40 MG PO TBEC: 40.0000 mg | DELAYED_RELEASE_TABLET | Freq: Every day | ORAL | 0 refills | Status: DC

## 2020-10-21 NOTE — Progress Notes (Signed)
PHARMACY CONSULT NOTE - FOLLOW UP  Pharmacy Consult for Electrolyte Monitoring and Replacement   Recent Labs: Potassium (mmol/L)  Date Value  10/21/2020 3.1 (L)  01/03/2014 3.6   Magnesium (mg/dL)  Date Value  10/19/2020 1.9   Calcium (mg/dL)  Date Value  10/21/2020 8.3 (L)   Calcium, Total (mg/dL)  Date Value  01/03/2014 10.2 (H)   Albumin (g/dL)  Date Value  10/19/2020 2.0 (L)  07/21/2020 3.7   Phosphorus (mg/dL)  Date Value  10/19/2020 3.3   Sodium (mmol/L)  Date Value  10/21/2020 133 (L)  09/03/2020 137  01/03/2014 134 (L)     Assessment:  79 year old female with PMHx of CVA with residual left sided weakness, HTN, breast cancer s/p left lumpectomy and XRT, hypothyroidism, COPD, CKD stge III, solitary left kidney with chronic hydronephrosis (s/p right nephrectomy in 2008/2009), paroxysmal A-fib, chronic bilateral carotid artery stenosis, 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 On TPN from 11/20 to 11/28. Pharmacy consulted to manage electrolytes.  Goal of Therapy:  Maintain electrolytes within range  Plan:  Continue Mag-Ox 400mg  po BID - K 3.1 - will replenish with 36meq KCl po x 1 - Follow up on AM labs.  Lu Duffel, PharmD, BCPS Clinical Pharmacist 10/21/2020 7:16 AM

## 2020-10-21 NOTE — Progress Notes (Signed)
Tried to call [facility] resources

## 2020-10-21 NOTE — Discharge Summary (Addendum)
Physician Discharge Summary  [name]:[id number] DOB: [birth year] DOA: 10/06/2020  PCP: Cathy Roys, DO  Admit date: 10/06/2020 Discharge date: 10/21/2020  Admitted From: Home  Discharge disposition: SNF   Recommendations for Outpatient Follow-Up:   . Follow up with your primary care provider at the skilled nursing facility in 3 to 5 days . Check CBC, BMP, magnesium in the next visit . Patient received of p.o. potassium prior to discharge.  Will need to check potassium in 3 to 5 days.  Potassium supplements have been given for next few days  . Patient is on 2 to 3 L of oxygen.  Could wean as possible . Patient will need urology follow-up with Dr. Dorette Grate in 1 month for stent follow-up . Encouraged soft diet for the patient, needs nutritional improvement.  [facility] to follow at SNF   Discharge Diagnosis:   Principal Problem:   Abdominal pain Active Problems:   Hypothyroidism   Hydronephrosis   Tobacco abuse   Hypertension   Severe protein-calorie malnutrition (HCC)   Atrial fibrillation (HCC)   Abdominal distention   Duodenal obstruction   SBO (small bowel obstruction) (HCC)   Goals of care, counseling/discussion   Palliative care by specialist    Discharge Condition: Improved.  Diet recommendation: Regular-consistency advised  Wound care: None.  Code status: Full.   History of Present Illness:   Cathy Solis a 79 y.o.femalewith past medical history of hypertension, hypothyroidism, chronic atrial fibrillation on Eliquis, COPD, solitary kidney, chronic hydronephrosis and bilateral carotid stenosis presented to the ED with complaints of acute abdominal pain. Patient was recently hospitalized on 09/27/2020 and discharged on 10/05/2020 for acute pyelonephritis. Plan was to follow-up with urology, cardiology and primary care provider as outpatient. She did have acute pyelonephritis with acute kidney injury at that time  and urine culture showed E. coli. She was also seen by cardiology for atrial fibrillation with RVR and Eliquis was started for CHA2DS2-VASc score of 6. Patient was also discharged home on home oxygen at 2 L/min. This time, patient was admitted to hospital for evaluation of abdominal pain.  GI and general surgery were consulted.  Patient was treated conservatively and has improved at this time.  Patient was briefly on TPN as well.  Patient has significantly improved and has been tolerating oral diet at this time.   Hospital Course:   Following conditions were addressed during hospitalization as listed below,  Abdominal pain.  Resolved at this time.  Initially was thought to be  multifactorial from duodenal compression versus small bowel obstruction due to hernia, stool impaction versus peptic ulcer disease, esophagitis.  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  GI and general surgery were consulted.  Patient had no clinical signs of bowel obstruction.    Patient was treated conservatively.    General surgery recommended advancing diet which she has tolerated.  She was on TPN temporarily, which has been discontinued at this time.   History of COPD.   Was consider 2 L of oxygen on last admission.    Will continue on discharge.  Acute kidney injury. Latest creatinine of 1.04.  Lisinopril has been restarted.  Check BMP as outpatient  Acute vs Recurrent Pyelonephritis-urine culture negative but the urology recommended total of 2 weeks of IV antibiotic.  Was recently admitted for acute pyelonephritis.  Off Foley  catheter at this time.    Has completed course of IV Rocephin.  Left hydronephrosis-chronic. Seen by urology. Status post Left ureteral stent placement with Dr. Diamantina [facility] on 10/08/20.   Off Foley catheter. Urology planning for long-term stent  exchanges every 6 to 12 months. Plan is to follow-up with urology clinic in 1 month.    Will need urology follow-up.  Constipation-improved, continue as needed laxatives    Oral thrush Improved.    Received magic mouthwash with lidocaine  Atrial fibrillation. Rate controlled. Continue Eliquis, metoprolol on discharge.  Essential hypertension-continue lisinopril and metoprolol.  Blood pressure seems to be stable.  Hypothyroidism-TSH within normal limits. Continue Synthroid.  Hypomagnesemia.  Improved with replacement.    Mild hypokalemia.  Potassium 40 mEq given prior to discharge.  We will continue potassium for few more days.  Will need to check potassium as outpatient  Severe protein calorie malnutrition-off TPN at this time.  Encourage oral nutrition.  She has been advancing on her oral intake for  Generalized weakness-improving gradually.  Plan for skilled nursing facility today.   Tobacco use disorder-nicotine patch patient wishes.  Long toenails.  Spoke with podiatry.  Recommend outpatient follow-up.  Updated the patient's family about it.   Disposition.  At this time, patient is stable for disposition to skilled nursing facility.  Spoke with the patient's daughter at bedside.  Medical Consultants:    GI  Urology  General surgery  Procedures:     NG tube placement and removal  Ureteral stent placement on 10/08/20 Subjective:   Today, patient was seen and examined at bedside.  Denies any nausea, vomiting, abdominal pain.  Has been tolerating p.o. diet well.  Discharge Exam:   Vitals:   10/21/20 0605 10/21/20 0814  BP: 128/64 (!) 148/68  Pulse: 70 64  Resp: 16 18  Temp: 98.8 F (37.1 C) 98.2 F (36.8 C)  SpO2: 97% 99%   Vitals:   10/20/20 2017 10/20/20 2337 10/21/20 0605 10/21/20 0814  BP:  140/66 128/64 (!) 148/68  Pulse:  63 70 64  Resp:  14 16 18   Temp:  98.2 F (36.8 C) 98.8 F (37.1 C) 98.2 F (36.8 C)  TempSrc:  Oral Oral  Oral  SpO2: 97% 100% 97% 99%  Weight:      Height:       General: Alert awake, not in obvious distress,thinly built, on nasal cannula oxygen, HENT: pupils equally reacting to light,  No scleral pallor or icterus noted. Oral mucosa is moist.  Chest:   Diminished breath sounds bilaterally. No crackles or wheezes.  CVS: S1 &S2 heard. No murmur.  Regular rate and rhythm. Abdomen: Soft, nontender, nondistended.  Bowel sounds are heard.   Extremities: No cyanosis, clubbing or edema.  Peripheral pulses are palpable. Psych: Alert, awake and oriented, normal mood CNS:  No cranial nerve deficits.  Power equal in all extremities.  Generalized weakness noted. Skin: Warm and dry.  No rashes noted.  The results of significant diagnostics from this hospitalization (including imaging, microbiology, ancillary and laboratory) are listed below for reference.     Diagnostic Studies:   CT ABDOMEN PELVIS WO CONTRAST  Result Date: 10/06/2020 CLINICAL DATA:  Abdominal distension EXAM: CT ABDOMEN AND PELVIS WITHOUT CONTRAST TECHNIQUE: Multidetector CT imaging of the abdomen and pelvis was performed following the standard protocol without IV contrast. COMPARISON:  CT 09/27/2020 FINDINGS: Lower chest: New small-to-moderate right pleural effusion with right basilar atelectasis and consolidation. There is high-density material within the atelectatic right  lower lobe suggesting aspirated oral contrast (series 2, image 1). New small left pleural effusion with associated mild compressive atelectasis. Heart size within normal limits. Coronary artery calcification. Hepatobiliary: Unenhanced liver is within normal limits. Status post cholecystectomy. Pancreas: No obvious abnormality. Spleen: Unremarkable. Adrenals/Urinary Tract: Stable appearance of the bilateral adrenal glands. Severe left-sided hydronephrosis with marked dilation of the right renal pelvis with abrupt caliber transition at the left ureteropelvic junction.  Prominent urothelial thickening. Multiple calcifications within the left renal collecting system are unchanged and may represent vascular calcifications or small stones. Distal left ureter is nondilated. Urinary bladder is moderately distended. Surgically absent right kidney. Stomach/Bowel: Mildly thickened appearance of the visualized distal esophagus. The stomach is markedly dilated. Oral contrast is present within the stomach and small bowel. No evidence of gastric outlet obstruction. The distal duodenum is extrinsically compressed by the markedly dilated right renal pelvis and adjacent superior mesenteric artery (series 2, image 34). There is a right femoral hernia containing a loop of small bowel. No abnormal upstream dilatation of small bowel to suggest obstruction. There appears to be a conglomeration of completely collapsed small bowel loops within the right lower quadrant (series 2, image 54). The oral contrast has not reached the level of the hernia sac at the time of scanning. Large volume of stool within the colon. No colonic inflammatory changes are seen. Vascular/Lymphatic: Tortuous, ectatic abdominal aorta with extensive atherosclerotic calcifications throughout the aortoiliac axis. Vascular structures are suboptimally evaluated in the absence of IV contrast. No lymphadenopathy identified. Reproductive: Status post hysterectomy. No adnexal masses. Other: Interval development of small volume ascites. No organized abdominopelvic fluid collection. No pneumoperitoneum. Musculoskeletal: Lumbar dextrocurvature with advanced multilevel lumbar spondylosis. No new or acute osseous findings. IMPRESSION: 1. Markedly distended stomach. Findings are likely related to duodenal obstruction. The distal duodenum is markedly narrowed between the superior mesenteric artery and the severely dilated left renal pelvis. 2. Persistent severe left-sided hydronephrosis with marked dilation of the right renal pelvis with abrupt  caliber transition at the left ureteropelvic junction. Prominent urothelial thickening. 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 3. Interval development of small volume ascites. 4. Right femoral hernia containing a loop of small bowel. No abnormal upstream dilatation of small bowel to suggest obstruction. The oral contrast has not reached the level of the hernia sac at the time of scanning. Consider short interval repeat scan to evaluate transit through this location. 5. New small-to-moderate right pleural effusion with right basilar atelectasis and consolidation. There is high-density material within the atelectatic right lower lobe suggesting aspirated oral contrast. 6. New small left pleural effusion with associated mild compressive atelectasis. 7. Mildly thickened appearance of the visualized distal esophagus, which may reflect esophagitis. 8. Large volume of stool within the colon. 9. Aortic atherosclerosis. (ICD10-I70.0). Electronically Signed   By: Davina Poke D.O.   On: 10/06/2020 11:41   DG Abd Portable 1 View  Result Date: 10/06/2020 CLINICAL DATA:  NG placement EXAM: PORTABLE ABDOMEN - 1 VIEW COMPARISON:  None. FINDINGS: Enteric tube passes into the stomach. Bowel gas pattern is not well evaluated. Dextroscoliosis of upper lumbar spine. IMPRESSION: Enteric tube within the stomach. Electronically Signed   By: Macy Mis M.D.   On: 10/06/2020 14:59     Labs:   Basic Metabolic Panel: Recent Labs  Lab 10/15/20 0455 10/15/20 0455 10/16/20 0435 10/16/20 0435 10/17/20 0429 10/17/20 0429 10/18/20 0655 10/18/20 0655 10/19/20 0505 10/19/20 0505 10/20/20 0517 10/21/20 0631  NA 136   < > 135   < >  134*  --  132*  --  134*  --  132* 133*  K 3.8   < > 3.7   < > 4.1   < > 3.8   < > 3.7   < > 3.5 3.1*  CL 104   < > 102   < > 104  --  104  --  104  --  100 101  CO2 24   < > 24   < > 22  --  23  --  24  --  23 24  GLUCOSE 106*   < > 109*   < > 99  --  101*  --  86  --  80 86   BUN 28*   < > 27*   < > 30*  --  29*  --  24*  --  20 18  CREATININE 1.01*   < > 0.87   < > 1.01*  --  1.06*  --  1.04*  --  1.10* 1.12*  CALCIUM 8.5*   < > 8.3*   < > 8.3*  --  8.4*  --  8.2*  --  8.5* 8.3*  MG 1.7  --  1.6*  --  2.1  --  1.9  --  1.9  --   --   --   PHOS 3.4  --  3.0  --  3.2  --   --   --  3.3  --   --   --    < > = values in this interval not displayed.   GFR Estimated Creatinine Clearance: 31.5 mL/min (A) (by C-G formula based on SCr of 1.12 mg/dL (H)). Liver Function Tests: Recent Labs  Lab 10/15/20 0455 10/19/20 0505  AST 11* 18  ALT 7 13  ALKPHOS 43 56  BILITOT 0.3 0.4  PROT 5.3* 5.4*  ALBUMIN 2.1* 2.0*   No results for input(s): LIPASE, AMYLASE in the last 168 hours. No results for input(s): AMMONIA in the last 168 hours. Coagulation profile No results for input(s): INR, PROTIME in the last 168 hours.  CBC: Recent Labs  Lab 10/15/20 0455 10/16/20 0435 10/18/20 0655 10/19/20 0505  WBC 8.4 9.6 7.1 6.8  NEUTROABS  --   --   --  4.9  HGB 10.4* 9.7* 8.5* 8.8*  HCT 31.4* 29.9* 26.5* 26.7*  MCV 93.5 93.1 94.0 94.3  PLT 221 214 203 214   Cardiac Enzymes: No results for input(s): CKTOTAL, CKMB, CKMBINDEX, TROPONINI in the last 168 hours. BNP: Invalid input(s): POCBNP CBG: Recent Labs  Lab 10/16/20 1213 10/16/20 1633 10/17/20 0048 10/17/20 0738 10/17/20 1240  GLUCAP 110* 105* 90 81 100*   D-Dimer No results for input(s): DDIMER in the last 72 hours. Hgb A1c No results for input(s): HGBA1C in the last 72 hours. Lipid Profile Recent Labs    10/19/20 0505  TRIG 101   Thyroid function studies No results for input(s): TSH, T4TOTAL, T3FREE, THYROIDAB in the last 72 hours.  Invalid input(s): FREET3 Anemia work up No results for input(s): VITAMINB12, FOLATE, FERRITIN, TIBC, IRON, RETICCTPCT in the last 72 hours. Microbiology Recent Results (from the past 240 hour(s))  Resp Panel by RT-PCR (Flu A&B, Covid) Nasopharyngeal Swab      Status: None   Collection Time: 10/21/20  9:10 AM   Specimen: Nasopharyngeal Swab; Nasopharyngeal(NP) swabs in vial transport medium  Result Value Ref Range Status   SARS Coronavirus 2 by RT PCR NEGATIVE NEGATIVE Final    Comment: (  NOTE) SARS-CoV-2 target nucleic acids are NOT DETECTED.  The SARS-CoV-2 RNA is generally detectable in upper respiratory specimens during the acute phase of infection. The lowest concentration of SARS-CoV-2 viral copies this assay can detect is 138 copies/mL. A negative result does not preclude SARS-Cov-2 infection and should not be used as the sole basis for treatment or other patient management decisions. A negative result may occur with  improper specimen collection/handling, submission of specimen other than nasopharyngeal swab, presence of viral mutation(s) within the areas targeted by this assay, and inadequate number of viral copies(<138 copies/mL). A negative result must be combined with clinical observations, patient history, and epidemiological information. The expected result is Negative.  Fact Sheet for Patients:  EntrepreneurPulse.com.au  Fact Sheet for Healthcare Providers:  IncredibleEmployment.be  This test is no t yet approved or cleared by the [place] FDA and  has been authorized for detection and/or diagnosis of SARS-CoV-2 by FDA under an Emergency Use Authorization (EUA). This EUA will remain  in effect (meaning this test can be used) for the duration of the COVID-19 declaration under Section 564(b)(1) of the Act, 21 U.S.C.section 360bbb-3(b)(1), unless the authorization is terminated  or revoked sooner.       Influenza A by PCR NEGATIVE NEGATIVE Final   Influenza B by PCR NEGATIVE NEGATIVE Final    Comment: (NOTE) The Xpert Xpress SARS-CoV-2/FLU/RSV plus assay is intended as an aid in the diagnosis of influenza from Nasopharyngeal swab specimens and should not be used as a sole basis  for treatment. Nasal washings and aspirates are unacceptable for Xpert Xpress SARS-CoV-2/FLU/RSV testing.  Fact Sheet for Patients: EntrepreneurPulse.com.au  Fact Sheet for Healthcare Providers: IncredibleEmployment.be  This test is not yet approved or cleared by the [place] FDA and has been authorized for detection and/or diagnosis of SARS-CoV-2 by FDA under an Emergency Use Authorization (EUA). This EUA will remain in effect (meaning this test can be used) for the duration of the COVID-19 declaration under Section 564(b)(1) of the Act, 21 U.S.C. section 360bbb-3(b)(1), unless the authorization is terminated or revoked.  Performed at [facility], [address], [place], [place] [address]      Discharge Instructions:   Discharge Instructions    Call MD for:  persistant nausea and vomiting   Complete by: As directed    Call MD for:  severe uncontrolled pain   Complete by: As directed    Call MD for:  temperature >100.4   Complete by: As directed    Diet - low sodium heart healthy   Complete by: As directed    Soft diet   Discharge instructions   Complete by: As directed    Follow-up with your primary care provider at the skilled nursing facility in 3 to 5 days.  Check blood work at that time.  Follow-up with urology clinic Dr. Claudie Leach in 1 month.   Increase activity slowly   Complete by: As directed    As per physical therapy   No wound care   Complete by: As directed      Allergies as of 10/21/2020      Reactions   Statins Other (See Comments)   Myalgias   Zetia [ezetimibe] Other (See Comments)   myalgias      Medication List    TAKE these medications   acetaminophen 325 MG tablet Commonly known as: TYLENOL Take 2 tablets (650 mg total) by mouth every 6 (six) hours as needed for mild pain (or Fever >/= 101).  albuterol 108 (90 Base) MCG/ACT inhaler Commonly known as: VENTOLIN HFA Inhale 2 puffs into  the lungs every 6 (six) hours as needed for wheezing or shortness of breath.   apixaban 2.5 MG Tabs tablet Commonly known as: ELIQUIS Take 1 tablet (2.5 mg total) by mouth 2 (two) times daily.   feeding supplement Liqd Take 237 mLs by mouth 2 (two) times daily between meals.   fluticasone furoate-vilanterol 200-25 MCG/INH Aepb Commonly known as: BREO ELLIPTA Inhale 1 puff into the lungs daily.   ipratropium-albuterol 0.5-2.5 (3) MG/3ML Soln Commonly known as: DUONEB Take 3 mLs by nebulization 2 (two) times daily for 4 days. DuoNeb nebulizer treatments twice a day for 4 days followed by as needed for severe shortness of breath What changed: additional instructions   levothyroxine 150 MCG tablet Commonly known as: SYNTHROID Take 1 tablet (150 mcg total) by mouth daily.   lisinopril 10 MG tablet Commonly known as: ZESTRIL Take 1 tablet (10 mg total) by mouth daily. TAKE 1 TABLET BY MOUTH EVERY DAY   magnesium oxide 400 (241.3 Mg) MG tablet Commonly known as: MAG-OX Take 1 tablet (400 mg total) by mouth 2 (two) times daily.   metoprolol tartrate 50 MG tablet Commonly known as: LOPRESSOR Take 1 tablet (50 mg total) by mouth 2 (two) times daily.   nicotine 21 mg/24hr patch Commonly known as: NICODERM CQ - dosed in mg/24 hours Place 1 patch (21 mg total) onto the skin daily.   ondansetron 4 MG tablet Commonly known as: Zofran Take 1 tablet (4 mg total) by mouth every 8 (eight) hours as needed for nausea or vomiting.   pantoprazole 40 MG tablet Commonly known as: Protonix Take 1 tablet (40 mg total) by mouth daily.   phenol 1.4 % Liqd Commonly known as: CHLORASEPTIC Use as directed 1 spray in the mouth or throat as needed for throat irritation / pain.   polyethylene glycol 17 g packet Commonly known as: MIRALAX / GLYCOLAX Take 17 g by mouth daily as needed for moderate constipation.   sucralfate 1 GM/10ML suspension Commonly known as: CARAFATE Place 10 mLs (1 g total)  orally 4 (four) times daily -  with meals and at bedtime for 7 days.       Contact information for follow-up providers    Billey Co, MD. Schedule an appointment as soon as possible for a visit in 4 week(s).   Specialty: Urology Why: Follow-up of  stent. Contact information: [place] [address] [phone]            Contact information for after-discharge care    Destination    [facility] Preferred SNF .   Service: Skilled Nursing Contact information: [address] [place] [phone]                   Time coordinating discharge: 39 minutes  Signed:  Vandell Kun  [facility] 10/21/2020, 10:23 AM

## 2020-10-21 NOTE — Plan of Care (Signed)

## 2020-10-21 NOTE — TOC Progression Note (Signed)
Transition of Care [facility]) - Progression Note    Patient Details  Name: Cathy Solis MRN: [id number] Date of Birth: [birth year]  Transition of [facility]) CM/SW Contact  Shelbie Hutching, RN Phone Number: 10/21/2020, 10:53 AM  Clinical Narrative:    Transport has been arranged with [facility], patient is first on the list for pick up.    Expected Discharge Plan: [place] Barriers to Discharge: Barriers Resolved  Expected Discharge Plan and Services Expected Discharge Plan: [place]   Discharge Planning Services: CM Consult Post Acute Care Choice: [place] Living arrangements for the past 2 months: Single Family Home Expected Discharge Date: 10/21/20                 DME Agency: NA       HH Arranged: NA           Social Determinants of Health (SDOH) Interventions    Readmission Risk Interventions No flowsheet data found.

## 2020-10-21 NOTE — TOC Transition Note (Signed)
Transition of Care [facility]) - CM/SW Discharge Note   Patient Details  Name: BYANCA KASPER MRN: [id number] Date of Birth: 07-21-1941  [facility]) CM/SW Contact:  Shelbie Hutching, RN Phone Number: 10/21/2020, 10:40 AM   Clinical Narrative:    Patient medically cleared for discharge to SNF for short term rehab.  Patient is going to [facility] room 603B.  Bedside RN will call report to [phone].  RNCM will set up transport with [facility].  Family is aware of [facility].    Final next level of care: [place] Barriers to Discharge: Barriers Resolved   Patient Goals and CMS Choice Patient states their goals for this hospitalization and ongoing recovery are:: To return to [facility] for rehab CMS Medicare.gov Compare Post Acute Care list provided to:: Patient Choice offered to / list presented to : Patient  Discharge Placement              Patient chooses bed at: [facility] [place] Patient to be transferred to facility by: [facility] Name of family member notified: Shan Levans Patient and family notified of of transfer: 10/21/20  Discharge Plan and Services   Discharge Planning Services: CM Consult Post Acute Care Choice: [place]            DME Agency: NA       HH Arranged: NA          Social Determinants of Health (SDOH) Interventions     Readmission Risk Interventions No flowsheet data found.

## 2020-10-22 ENCOUNTER — Encounter: Payer: Self-pay | Admitting: Family Medicine

## 2020-10-22 DIAGNOSIS — Z905 Acquired absence of kidney: Secondary | ICD-10-CM | POA: Diagnosis not present

## 2020-10-22 DIAGNOSIS — G894 Chronic pain syndrome: Secondary | ICD-10-CM | POA: Diagnosis not present

## 2020-10-22 DIAGNOSIS — R197 Diarrhea, unspecified: Secondary | ICD-10-CM | POA: Diagnosis not present

## 2020-10-22 DIAGNOSIS — M6281 Muscle weakness (generalized): Secondary | ICD-10-CM | POA: Diagnosis not present

## 2020-10-22 DIAGNOSIS — I10 Essential (primary) hypertension: Secondary | ICD-10-CM | POA: Diagnosis not present

## 2020-10-22 DIAGNOSIS — I4891 Unspecified atrial fibrillation: Secondary | ICD-10-CM | POA: Diagnosis not present

## 2020-10-23 DIAGNOSIS — D649 Anemia, unspecified: Secondary | ICD-10-CM | POA: Diagnosis not present

## 2020-10-26 DIAGNOSIS — E871 Hypo-osmolality and hyponatremia: Secondary | ICD-10-CM | POA: Diagnosis not present

## 2020-10-26 DIAGNOSIS — I10 Essential (primary) hypertension: Secondary | ICD-10-CM | POA: Diagnosis not present

## 2020-10-26 DIAGNOSIS — I4891 Unspecified atrial fibrillation: Secondary | ICD-10-CM | POA: Diagnosis not present

## 2020-11-04 ENCOUNTER — Telehealth: Payer: Self-pay

## 2020-11-04 ENCOUNTER — Telehealth: Payer: Self-pay | Admitting: General Practice

## 2020-11-04 NOTE — Telephone Encounter (Signed)
  Chronic Care Management   Outreach Note  11/04/2020 Name: Cathy Solis MRN: [id number] DOB: 1940-12-23  Referred by: Valerie Roys, DO Reason for referral : Appointment (RNCM Follow up appointment for Chronic Disease Management and Care Coordination Needs: The patient currently in rehabilitation. )   Cathy Solis is enrolled in a Managed [place]: No  An unsuccessful telephone outreach was attempted today. The patient was referred to the case management team for assistance with care management and care coordination. The phone number listed is to the facility- [facility]. The patient is unavailable. Will reschedule appointment.  Unsure how long the patient will be in rehabilitation.   Follow Up Plan: The care management team will reach out to the patient again over the next 30 to 60 days.   Noreene Larsson RN, MSN, [facility] Mobile: [phone]

## 2020-11-09 DIAGNOSIS — L600 Ingrowing nail: Secondary | ICD-10-CM | POA: Diagnosis not present

## 2020-11-09 DIAGNOSIS — R69 Illness, unspecified: Secondary | ICD-10-CM | POA: Diagnosis not present

## 2020-11-09 DIAGNOSIS — I4891 Unspecified atrial fibrillation: Secondary | ICD-10-CM | POA: Diagnosis not present

## 2020-11-09 DIAGNOSIS — I10 Essential (primary) hypertension: Secondary | ICD-10-CM | POA: Diagnosis not present

## 2020-11-11 ENCOUNTER — Non-Acute Institutional Stay: Payer: Medicare HMO | Admitting: Primary Care

## 2020-11-11 ENCOUNTER — Encounter: Payer: Self-pay | Admitting: Urology

## 2020-11-11 ENCOUNTER — Other Ambulatory Visit: Payer: Self-pay

## 2020-11-11 ENCOUNTER — Ambulatory Visit (INDEPENDENT_AMBULATORY_CARE_PROVIDER_SITE_OTHER): Payer: Medicare HMO | Admitting: Urology

## 2020-11-11 VITALS — BP 121/76 | HR 81 | Ht 64.0 in | Wt 100.0 lb

## 2020-11-11 DIAGNOSIS — R634 Abnormal weight loss: Secondary | ICD-10-CM

## 2020-11-11 DIAGNOSIS — Z515 Encounter for palliative care: Secondary | ICD-10-CM | POA: Diagnosis not present

## 2020-11-11 DIAGNOSIS — J410 Simple chronic bronchitis: Secondary | ICD-10-CM

## 2020-11-11 DIAGNOSIS — N130 Hydronephrosis with ureteropelvic junction obstruction: Secondary | ICD-10-CM

## 2020-11-11 DIAGNOSIS — N1832 Chronic kidney disease, stage 3b: Secondary | ICD-10-CM

## 2020-11-11 DIAGNOSIS — M810 Age-related osteoporosis without current pathological fracture: Secondary | ICD-10-CM

## 2020-11-11 NOTE — Progress Notes (Signed)
   11/11/2020 5:03 PM   Cathy Solis July 08, 1941 [id number]  Reason for visit: Follow up solitary left kidney, UPJ obstruction, UTI  HPI: I saw Cathy Solis and Cathy Solis in urology clinic today for follow-up.  She is a very complex and comorbid 79 year old female who is extremely frail who recently had a prolonged hospitalization for left pyelonephritis and bowel obstruction.  Much of the history today is obtained from Cathy Solis.  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  She was lost to follow-up, and presented to the ER in November 2021 with pyelonephritis, and after an extensive conversation about risks and benefits of different options, opted for a left ureteral stent placement for drainage of Cathy left kidney.  This did improve Cathy renal function from eGFR ~35 to eGFR ~60.  She was also having some chronic left-sided back pain at that time, which has resolved after stent placement.  She remains quite frail, and in rehab.  Cathy only complaint today is some right-sided groin and hip pain of unclear etiology.  She specifically denies any left flank, left abdominal pain, or urinary symptoms.  She is bedbound, and does have incontinence at baseline.  We discussed at length that it is reassuring that Cathy renal function and left-sided back pain have improved after stent placement.  She would not be a candidate for a complex UPJ reconstruction with Cathy frailty and comorbidities.  I think chronic stent exchanges are reasonable, and we will tentatively plan for yearly stent changes unless she develops UTIs or other problems.  Return precautions were discussed at length.  RTC 7 months symptom check, and consider scheduling stent exchange at that time  [place], [place] [address] [place], [place] [address] [phone]

## 2020-11-11 NOTE — Progress Notes (Signed)
[place] Consult Note Telephone: [phone]  Fax: [phone]     Date of encounter: 11/11/20 PATIENT NAME: Cathy Solis [id number] [phone] (home)  DOB: 1941/04/19 MRN: [id number]  PRIMARY CARE PROVIDER:    Jennette Bill., MD,  [address] [place] [address] [phone]  REFERRING PROVIDER:   Jennette Bill., MD,  [place] [address] [phone]  RESPONSIBLE PARTY:   Extended Emergency Contact Information Primary Emergency Contact: Cathy Solis [address] [address] of [place] Phone: [phone] Relation: Sister Secondary Emergency Contact: Cathy Solis, Cathy Solis Mobile Phone: [phone] Relation: Son  I met face to face with patient and family in facility. Palliative Care was asked to follow this patient by consultation request of Hester, Ronnette Hila., MD  to help address advance care planning and goals of care. This is the initial visit.   ASSESSMENT AND RECOMMENDATIONS:   1. Advance Care Planning/Goals of Care: Goals include to maximize quality of life and symptom management. Our advance care planning conversation included a discussion about:     The value and importance of advance care planning  Exploration of personal, cultural or spiritual beliefs that might influence medical decisions   Exploration of goals of care in the event of a sudden injury or illness   Identification of a healthcare agent - son is POA.  Review of an  advance directive document  - MOST form  Decision not to resuscitate and to de-escalate disease focused treatments due to poor prognosis discussed with son/ POA. Discussed CPR and patient frailty.  2. Symptom Management:   Nutrition: Poor nutrition, albumin 2.0.  Nutritional supplements needed.Recommend magic cup and nutrition consult ASAP. Family states she does not like the shake type  supplements. She has lost 11 % weight in 1 month per hospital record.  Disease course: Recent hospitalization for pyelonephritis,with subsequent debility.She has been reticent to do much PT due to mentation and abdominal pain.  Son asks for patient to be encouraged to move and  participate in PT.   Patient has bene refusing some care, meds, etc. She will respond to knowing her therapy and meds have been prescribed by her doctor. Please encourage her participation in plan of care.   Delirium: Son describes delirium sx, Recommend to treat her pain proactively, with Scheduled atc tylenol CR po. Son states psych consult but this could take a month.  I would recommend to start zoloft 25 mg po for mood, pain and appetite or antidepressant of choice.  3. Follow up Palliative Care Visit: Palliative care will continue to follow for goals of care clarification and symptom management. Return 2 weeks or prn.  4. Family /Caregiver/Community Supports:  Has son who is POA, two sisters in the area.   5. Cognitive / Functional decline: A and O x 1-2, forgetfulness, delirium, dependent in all adls, iadls.   I spent 45 minutes providing this consultation,  from 1300 to 1345. More than 50% of the time in this consultation was spent coordinating communication.   CODE STATUS: FULL per record at SNF  PPS: 30%  HOSPICE ELIGIBILITY/DIAGNOSIS: TBD  Subjective:  CHIEF COMPLAINT: debility, delirium   HISTORY OF PRESENT ILLNESS:  Cathy Solis is a 79 y.o. year old female  with recent hospital stay s/p pylonephritis, UTI, delirium on cognitive impairment, fall risk.  We are asked to  consult around advance care planning and complex medical decision making.    Review and summarization of old Epic records shows or history from other than patient. Review or lab tests, radiology,  or medicine albumin 2.0 Review of case with family member son Cathy Solis and sister Cathy Solis  History obtained from review of EMR, discussion  with primary team, and  interview with family, caregiver  and/or Cathy Solis. Records reviewed and summarized above.   CURRENT PROBLEM LIST:  Patient Active Problem List   Diagnosis Date Noted  . SBO (small bowel obstruction) ([place])   . Goals of care, counseling/discussion   . Palliative care by specialist   . Abdominal distention   . Duodenal obstruction   . Abdominal pain 10/06/2020  . Hematemesis with nausea   . Upper GI bleed   . UTI (urinary tract infection) 09/27/2020  . Acute pyelonephritis 09/27/2020  . Unintentional weight loss 09/27/2020  . Debility 09/27/2020  . Atrial fibrillation ([place])   . Severe protein-calorie malnutrition ([place]) 09/03/2020  . 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 Chronic kidney disease (CKD), stage III (moderate) ([place]) 08/20/2019  . RBBB 05/09/2019  . Pressure injury of skin 01/15/2019  . COPD (chronic obstructive pulmonary disease) ([place]) 01/14/2019  . Hypertension 05/04/2018  . Osteoporosis 11/02/2017  . Coronary atherosclerosis 05/23/2016  . Personal history of tobacco use, presenting hazards to health 03/18/2016  . Idiopathic scoliosis 11/28/2014  . Tobacco abuse 11/28/2014  . 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 Hydronephrosis 02/04/2014  . 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 Carotid stenosis 05/23/2013  . Pernicious anemia 06/26/2012  . Hyperlipidemia 12/29/2011  . 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 Hypothyroidism 08/04/2011   PAST MEDICAL HISTORY:  Active Ambulatory Problems    Diagnosis Date Noted  . 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 Hypothyroidism 08/04/2011  . Hyperlipidemia 12/29/2011  . Pernicious anemia 06/26/2012  . Carotid stenosis 05/23/2013  . 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 Hydronephrosis 02/04/2014  . Idiopathic scoliosis 11/28/2014  . Tobacco abuse 11/28/2014  . 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  Personal history of tobacco use, presenting hazards to health 03/18/2016  . Coronary atherosclerosis 05/23/2016  . Osteoporosis 11/02/2017  . Hypertension 05/04/2018  . COPD (chronic obstructive pulmonary disease) ([place]) 01/14/2019  . Pressure injury of skin 01/15/2019  . RBBB 05/09/2019  . 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 Chronic kidney disease (CKD), stage III (moderate) ([place]) 08/20/2019  . Severe protein-calorie malnutrition ([place]) 09/03/2020  . UTI (urinary tract infection) 09/27/2020  . Acute pyelonephritis 09/27/2020  . Unintentional weight loss 09/27/2020  . Debility 09/27/2020  . Atrial fibrillation ([place])   . Hematemesis with nausea   . Upper GI bleed   . Abdominal pain 10/06/2020  . Abdominal distention   . Duodenal obstruction   . SBO (small bowel obstruction) ([place])   . Goals of care, counseling/discussion   . Palliative care by specialist    Resolved Ambulatory Problems    Diagnosis Date Noted  . Proteinuria 12/29/2011  . Weakness 06/26/2012  . Medicare annual wellness visit, subsequent 11/13/2013  . Postmenopausal estrogen deficiency 11/13/2013  . Chronic fatigue 06/24/2015  . Visit for screening mammogram 06/24/2015  . Pyelonephritis 09/28/2020   Past Medical History:  Diagnosis Date  . Breast cancer ([facility]) 2006  . Cancer ([facility]) 2006  . Kidney problem   . 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 Stroke ([facility])   .  Toe infection    SOCIAL HX:  Social History   Tobacco Use  . Smoking status: Current Some Day Smoker    Packs/day: 0.50    Years: 30.00    Pack years: 15.00    Types: Cigarettes    Last attempt to quit: 01/08/2019    Years since quitting: 1.8  . Smokeless tobacco: Never Used  Substance Use Topics  . Alcohol use: No   FAMILY HX:  Family History  Problem Relation Age of Onset  . Stroke Mother   . Heart disease Father   . Heart attack Maternal Grandfather       ALLERGIES:  Allergies  Allergen  Reactions  . Statins Other (See Comments)    Myalgias  . Zetia [Ezetimibe] Other (See Comments)    myalgias     PERTINENT MEDICATIONS:  Outpatient Encounter Medications as of 11/11/2020  Medication Sig  . acetaminophen (TYLENOL) 325 MG tablet Take 2 tablets (650 mg total) by mouth every 6 (six) hours as needed for mild pain (or Fever >/= 101).  [name] albuterol (VENTOLIN HFA) 108 (90 Base) MCG/ACT inhaler Inhale 2 puffs into the lungs every 6 (six) hours as needed for wheezing or shortness of breath.  [name] apixaban (ELIQUIS) 2.5 MG TABS tablet Take 1 tablet (2.5 mg total) by mouth 2 (two) times daily.  . fluticasone furoate-vilanterol (BREO ELLIPTA) 200-25 MCG/INH AEPB Inhale 1 puff into the lungs daily.  [name] ipratropium-albuterol (DUONEB) 0.5-2.5 (3) MG/3ML SOLN Take 3 mLs by nebulization 2 (two) times daily for 4 days. DuoNeb nebulizer treatments twice a day for 4 days followed by as needed for severe shortness of breath  . levothyroxine (SYNTHROID) 150 MCG tablet Take 1 tablet (150 mcg total) by mouth daily.  [name] lisinopril (ZESTRIL) 10 MG tablet Take 1 tablet (10 mg total) by mouth daily. TAKE 1 TABLET BY MOUTH EVERY DAY  . magnesium oxide (MAG-OX) 400 (241.3 Mg) MG tablet Take 1 tablet (400 mg total) by mouth 2 (two) times daily.  . metoprolol tartrate (LOPRESSOR) 50 MG tablet Take 1 tablet (50 mg total) by mouth 2 (two) times daily.  . nicotine (NICODERM CQ - DOSED IN MG/24 HOURS) 21 mg/24hr patch Place 1 patch (21 mg total) onto the skin daily.  . ondansetron (ZOFRAN) 4 MG tablet Take 1 tablet (4 mg total) by mouth every 8 (eight) hours as needed for nausea or vomiting.  . pantoprazole (PROTONIX) 40 MG tablet Take 1 tablet (40 mg total) by mouth daily.  . phenol (CHLORASEPTIC) 1.4 % LIQD Use as directed 1 spray in the mouth or throat as needed for throat irritation / pain.  . polyethylene glycol (MIRALAX / GLYCOLAX) 17 g packet Take 17 g by mouth daily as needed for moderate constipation.  .  sucralfate (CARAFATE) 1 GM/10ML suspension Take 10 mLs (1 g total) by mouth 4 (four) times daily -  with meals and at bedtime for 7 days.   No facility-administered encounter medications on file as of 11/11/2020.    Objective: ROS/staff and family report  General: NAD ENMT: denies dysphagia Pulmonary: endorses cough, endorses  SOB Abdomen: endorses poor appetite, denies constipation, endorses incontinence of bowel GU: denies dysuria, endorses incontinence of urine MSK:  endorses ROM limitations, no falls reported at SNF, falls risk Skin: denies rashes or wounds,  Healing wound on L LE, skin friable Neurological: endorses weakness, endorses right flank pain, denies insomnia Psych: Endorses agitated mood  Physical Exam: Current and past weights:100 lbs, 1 month ago  113 lbs, 11% loss Constitutional:  NAD General: frail appearing, thin EYES: anicteric sclera,lids intact, no discharge  ENMT: intact hearing,oral mucous membranes moist, partial dental loss CV:  no LE edema Pulmonary:no increased work of breathing, + productive  cough,  Oxygen/ [place] Abdomen: intake 25-50%,  no ascites GU: deferred MSK: severe sarcopenia, decreased ROM in all extremities, no contractures of LE, reduced mobility, working with PT. Skin: warm and dry, no rashes or wounds on visible skin Neuro: + fatigue, + cognitive impairment, delirium at times, Psych: agitated, anxious affect, A and O x 2 Hem/lymph/immuno: no widespread bruising  Thank you for the opportunity to participate in the care of Cathy Solis.  The palliative care team will continue to follow. Please call our office at [phone] if we can be of additional assistance.  Jason Coop, NP , DNP, MPH, AGPCNP-BC, ACHPN  COVID-19 PATIENT SCREENING TOOL  Person answering questions: ____________staff______ _____   1.  Is the patient or any family member in the home showing any signs or symptoms regarding respiratory infection?                Person with Symptom- __________NA_________________  a. Fever                                                                          Yes___ No___          ___________________  b. Shortness of breath                                                    Yes___ No___          ___________________ c. Cough/congestion                                       Yes___  No___         ___________________ d. Body aches/pains                                                         Yes___ No___        ____________________ e. Gastrointestinal symptoms (diarrhea, nausea)           Yes___ No___        ____________________  2. Within the past 14 days, has anyone living in the home had any contact with someone with or under investigation for COVID-19?    Yes___ No_X_   Person __________________

## 2020-11-12 NOTE — Telephone Encounter (Signed)
Spoke with Pt's son he stated he would bring power of attorney for our records, Pt scheduled for virtual apt for 11/19/2020.Pt's son verbalized understanding.

## 2020-11-18 ENCOUNTER — Telehealth: Payer: Self-pay

## 2020-11-18 DIAGNOSIS — J441 Chronic obstructive pulmonary disease with (acute) exacerbation: Secondary | ICD-10-CM | POA: Diagnosis not present

## 2020-11-18 DIAGNOSIS — R059 Cough, unspecified: Secondary | ICD-10-CM | POA: Diagnosis not present

## 2020-11-18 DIAGNOSIS — R109 Unspecified abdominal pain: Secondary | ICD-10-CM | POA: Diagnosis not present

## 2020-11-18 DIAGNOSIS — G9341 Metabolic encephalopathy: Secondary | ICD-10-CM | POA: Diagnosis not present

## 2020-11-19 ENCOUNTER — Encounter: Payer: Self-pay | Admitting: Family Medicine

## 2020-11-19 ENCOUNTER — Telehealth (INDEPENDENT_AMBULATORY_CARE_PROVIDER_SITE_OTHER): Payer: Medicare HMO | Admitting: Family Medicine

## 2020-11-19 DIAGNOSIS — U071 COVID-19: Secondary | ICD-10-CM

## 2020-11-19 DIAGNOSIS — G9341 Metabolic encephalopathy: Secondary | ICD-10-CM | POA: Diagnosis not present

## 2020-11-19 DIAGNOSIS — N390 Urinary tract infection, site not specified: Secondary | ICD-10-CM | POA: Diagnosis not present

## 2020-11-19 DIAGNOSIS — J441 Chronic obstructive pulmonary disease with (acute) exacerbation: Secondary | ICD-10-CM | POA: Diagnosis not present

## 2020-11-19 DIAGNOSIS — R413 Other amnesia: Secondary | ICD-10-CM | POA: Diagnosis not present

## 2020-11-19 DIAGNOSIS — E440 Moderate protein-calorie malnutrition: Secondary | ICD-10-CM

## 2020-11-19 DIAGNOSIS — R5381 Other malaise: Secondary | ICD-10-CM

## 2020-11-19 DIAGNOSIS — D649 Anemia, unspecified: Secondary | ICD-10-CM | POA: Diagnosis not present

## 2020-11-19 NOTE — Progress Notes (Addendum)
There were no vitals taken for this visit.   Subjective:    Patient ID: Cathy Solis, female    DOB: [birth year], 79 y.o.   MRN: [id number]  HPI: Cathy Solis is a 79 y.o. female  Chief Complaint  Patient presents with  . Covid Positive    Pt son states pt is very fatigue and confused. [facility] is at [facility]    Just tested positive for COVID. Currently at [facility] for rehab on quarantine. Her son and sister note that starting Tuesday, she started feeling ill again like she had when she had the UTI- they diagnosed her on Wednesday with COVID. She has not really been eating or drinking. She has been entirely incontinent of bowel and bladder. They are not happy with the care [facility] has been getting [facility] because they will allow her to sit in a dirty brief for up to 12 hours.   She has been having a lot more issues with her memory and with her dementia. She has stopped doing PT because she was not being particularly cooperative. They are planning on trying to get her home. They have a lot of help to have her come home. Son is looking for an aide to come in.   Would need a hoyer lift. Wheelchair. Hospital bed, bed side comode to allow her to come home as well as home health and an aide. She has a lot of help once she gets home- she is not scheduled to come home yet.   Relevant past medical, surgical, family and social history reviewed and updated as indicated. Interim medical history since our last visit reviewed. Allergies and medications reviewed and updated.  Review of Systems  Constitutional: Negative.   Respiratory: Negative.   Cardiovascular: Negative.   Gastrointestinal: Negative.   Musculoskeletal: Negative.   Psychiatric/Behavioral: Negative.     Per HPI unless specifically indicated above     Objective:    There were no vitals taken for this visit.  Wt Readings from Last 3 Encounters:  11/11/20 100 lb (45.4 kg)  10/18/20 111 lb 8 oz (50.6 kg)  09/27/20  130 lb (59 kg)    Physical Exam Vitals and nursing note reviewed.  Constitutional:      General: She is not in acute distress.    Appearance: Normal appearance. She is not ill-appearing, toxic-appearing or diaphoretic.  HENT:     Head: Normocephalic and atraumatic.     Right Ear: External ear normal.     Left Ear: External ear normal.     Nose: Nose normal.     Mouth/Throat:     Mouth: Mucous membranes are moist.     Pharynx: Oropharynx is clear.  Eyes:     General: No scleral icterus.       Right eye: No discharge.        Left eye: No discharge.     Conjunctiva/sclera: Conjunctivae normal.     Pupils: Pupils are equal, round, and reactive to light.  Pulmonary:     Effort: Pulmonary effort is normal. No respiratory distress.     Comments: Speaking in full sentences Musculoskeletal:        General: Normal range of motion.     Cervical back: Normal range of motion.  Skin:    Coloration: Skin is not jaundiced or pale.     Findings: No bruising, erythema, lesion or rash.  Neurological:     Mental Status: She is alert and oriented  to person, place, and time. Mental status is at baseline.  Psychiatric:        Mood and Affect: Mood normal.        Behavior: Behavior normal.        Thought Content: Thought content normal.        Judgment: Judgment normal.     Results for orders placed or performed during the hospital encounter of 10/06/20  Resp Panel by RT PCR (RSV, Flu A&B, Covid) - Urine, Clean Catch   Specimen: Urine, Clean Catch  Result Value Ref Range   SARS Coronavirus 2 by RT PCR NEGATIVE NEGATIVE   Influenza A by PCR NEGATIVE NEGATIVE   Influenza B by PCR NEGATIVE NEGATIVE   Respiratory Syncytial Virus by PCR NEGATIVE NEGATIVE  Urine Culture   Specimen: Urine, Random  Result Value Ref Range   Specimen Description      URINE, RANDOM Performed at [facility], [address] [place], [place] [address]    Special Requests      NONE Performed at [facility], [address], [place], [place] [address]    Culture      NO GROWTH Performed at [facility], [place] [address] [place], [place] [address]    Report Status 10/08/2020 FINAL   Resp Panel by RT-PCR (Flu A&B, Covid) Nasopharyngeal Swab   Specimen: Nasopharyngeal Swab; Nasopharyngeal(NP) swabs in vial transport medium  Result Value Ref Range   SARS Coronavirus 2 by RT PCR NEGATIVE NEGATIVE   Influenza A by PCR NEGATIVE NEGATIVE   Influenza B by PCR NEGATIVE NEGATIVE  CBC with Differential  Result Value Ref Range   WBC 14.7 (H) 4.0 - 10.5 K/uL   RBC 4.03 3.87 - 5.11 MIL/uL   Hemoglobin 12.4 12.0 - 15.0 g/dL   HCT 37.2 36.0 - 46.0 %   MCV 92.3 80.0 - 100.0 fL   MCH 30.8 26.0 - 34.0 pg   MCHC 33.3 30.0 - 36.0 g/dL   RDW 13.2 11.5 - 15.5 %   Platelets 415 (H) 150 - 400 K/uL   nRBC 0.0 0.0 - 0.2 %   Neutrophils Relative % 83 %   Neutro Abs 12.1 (H) 1.7 - 7.7 K/uL   Lymphocytes Relative 9 %   Lymphs Abs 1.3 0.7 - 4.0 K/uL   Monocytes Relative 7 %   Monocytes Absolute 1.1 (H) 0.1 - 1.0 K/uL   Eosinophils Relative 0 %   Eosinophils Absolute 0.0 0.0 - 0.5 K/uL   Basophils Relative 0 %   Basophils Absolute 0.1 0.0 - 0.1 K/uL   Immature Granulocytes 1 %   Abs Immature Granulocytes 0.20 (H) 0.00 - 0.07 K/uL  Lactic acid, plasma  Result Value Ref Range   Lactic Acid, Venous 1.0 0.5 - 1.9 mmol/L  Lipase, blood  Result Value Ref Range   Lipase 45 11 - 51 U/L  Comprehensive metabolic panel  Result Value Ref Range   Sodium 132 (L) 135 - 145 mmol/L   Potassium 3.9 3.5 - 5.1 mmol/L   Chloride 97 (L) 98 - 111 mmol/L   CO2 25 22 - 32 mmol/L   Glucose, Bld 124 (H) 70 - 99 mg/dL   BUN 27 (H) 8 - 23 mg/dL   Creatinine, Ser 1.56 (H) 0.44 - 1.00 mg/dL   Calcium 8.5 (L) 8.9 - 10.3 mg/dL   Total Protein 6.3 (L) 6.5 - 8.1 g/dL   Albumin 2.4 (L) 3.5 - 5.0 g/dL   AST  13 (L) 15 - 41 U/L   ALT 10 0 - 44 U/L   Alkaline Phosphatase 47 38 - 126 U/L   Total Bilirubin 0.5 0.3 -  1.2 mg/dL   GFR, Estimated 33 (L) >60 mL/min   Anion gap 10 5 - 15  Protime-INR  Result Value Ref Range   Prothrombin Time 13.8 11.4 - 15.2 seconds   INR 1.1 0.8 - 1.2  Urinalysis, Complete w Microscopic  Result Value Ref Range   Color, Urine YELLOW (A) YELLOW   APPearance TURBID (A) CLEAR   Specific Gravity, Urine 1.016 1.005 - 1.030   pH 5.0 5.0 - 8.0   Glucose, UA NEGATIVE NEGATIVE mg/dL   Hgb urine dipstick LARGE (A) NEGATIVE   Bilirubin Urine NEGATIVE NEGATIVE   Ketones, ur NEGATIVE NEGATIVE mg/dL   Protein, ur 100 (A) NEGATIVE mg/dL   Nitrite NEGATIVE NEGATIVE   Leukocytes,Ua MODERATE (A) NEGATIVE   RBC / HPF >50 (H) 0 - 5 RBC/hpf   WBC, UA >50 (H) 0 - 5 WBC/hpf   Bacteria, UA MANY (A) NONE SEEN   Squamous Epithelial / LPF NONE SEEN 0 - 5   WBC Clumps PRESENT   Comprehensive metabolic panel  Result Value Ref Range   Sodium 132 (L) 135 - 145 mmol/L   Potassium 4.3 3.5 - 5.1 mmol/L   Chloride 99 98 - 111 mmol/L   CO2 24 22 - 32 mmol/L   Glucose, Bld 85 70 - 99 mg/dL   BUN 25 (H) 8 - 23 mg/dL   Creatinine, Ser 1.47 (H) 0.44 - 1.00 mg/dL   Calcium 8.4 (L) 8.9 - 10.3 mg/dL   Total Protein 5.6 (L) 6.5 - 8.1 g/dL   Albumin 2.1 (L) 3.5 - 5.0 g/dL   AST 12 (L) 15 - 41 U/L   ALT 9 0 - 44 U/L   Alkaline Phosphatase 40 38 - 126 U/L   Total Bilirubin 0.5 0.3 - 1.2 mg/dL   GFR, Estimated 36 (L) >60 mL/min   Anion gap 9 5 - 15  CBC  Result Value Ref Range   WBC 10.7 (H) 4.0 - 10.5 K/uL   RBC 3.72 (L) 3.87 - 5.11 MIL/uL   Hemoglobin 11.3 (L) 12.0 - 15.0 g/dL   HCT 34.3 (L) 36.0 - 46.0 %   MCV 92.2 80.0 - 100.0 fL   MCH 30.4 26.0 - 34.0 pg   MCHC 32.9 30.0 - 36.0 g/dL   RDW 13.2 11.5 - 15.5 %   Platelets 358 150 - 400 K/uL   nRBC 0.0 0.0 - 0.2 %  Basic metabolic panel  Result Value Ref Range   Sodium 133 (L) 135 - 145 mmol/L   Potassium 3.8 3.5 - 5.1 mmol/L   Chloride 98 98 - 111 mmol/L   CO2 25 22 - 32 mmol/L   Glucose, Bld 70 70 - 99 mg/dL   BUN 24 (H) 8 - 23  mg/dL   Creatinine, Ser 1.29 (H) 0.44 - 1.00 mg/dL   Calcium 8.3 (L) 8.9 - 10.3 mg/dL   GFR, Estimated 42 (L) >60 mL/min   Anion gap 10 5 - 15  Magnesium  Result Value Ref Range   Magnesium 1.7 1.7 - 2.4 mg/dL  Phosphorus  Result Value Ref Range   Phosphorus 2.8 2.5 - 4.6 mg/dL  CBC  Result Value Ref Range   WBC 11.2 (H) 4.0 - 10.5 K/uL   RBC 4.14 3.87 - 5.11 MIL/uL   Hemoglobin 12.6 12.0 -  15.0 g/dL   HCT 35.3 29.9 - 24.2 %   MCV 92.3 80.0 - 100.0 fL   MCH 30.4 26.0 - 34.0 pg   MCHC 33.0 30.0 - 36.0 g/dL   RDW 68.3 41.9 - 62.2 %   Platelets 363 150 - 400 K/uL   nRBC 0.0 0.0 - 0.2 %  Basic metabolic panel  Result Value Ref Range   Sodium 134 (L) 135 - 145 mmol/L   Potassium 3.6 3.5 - 5.1 mmol/L   Chloride 99 98 - 111 mmol/L   CO2 26 22 - 32 mmol/L   Glucose, Bld 78 70 - 99 mg/dL   BUN 25 (H) 8 - 23 mg/dL   Creatinine, Ser 2.97 (H) 0.44 - 1.00 mg/dL   Calcium 8.4 (L) 8.9 - 10.3 mg/dL   GFR, Estimated 39 (L) >60 mL/min   Anion gap 9 5 - 15  Magnesium  Result Value Ref Range   Magnesium 1.7 1.7 - 2.4 mg/dL  Phosphorus  Result Value Ref Range   Phosphorus 2.8 2.5 - 4.6 mg/dL  CBC  Result Value Ref Range   WBC 9.7 4.0 - 10.5 K/uL   RBC 3.78 (L) 3.87 - 5.11 MIL/uL   Hemoglobin 11.5 (L) 12.0 - 15.0 g/dL   HCT 98.9 (L) 21.1 - 94.1 %   MCV 91.3 80.0 - 100.0 fL   MCH 30.4 26.0 - 34.0 pg   MCHC 33.3 30.0 - 36.0 g/dL   RDW 74.0 81.4 - 48.1 %   Platelets 287 150 - 400 K/uL   nRBC 0.0 0.0 - 0.2 %  Basic metabolic panel  Result Value Ref Range   Sodium 133 (L) 135 - 145 mmol/L   Potassium 3.7 3.5 - 5.1 mmol/L   Chloride 98 98 - 111 mmol/L   CO2 27 22 - 32 mmol/L   Glucose, Bld 100 (H) 70 - 99 mg/dL   BUN 25 (H) 8 - 23 mg/dL   Creatinine, Ser 8.56 (H) 0.44 - 1.00 mg/dL   Calcium 8.3 (L) 8.9 - 10.3 mg/dL   GFR, Estimated 39 (L) >60 mL/min   Anion gap 8 5 - 15  Magnesium  Result Value Ref Range   Magnesium 2.1 1.7 - 2.4 mg/dL  Phosphorus  Result Value Ref Range    Phosphorus 2.2 (L) 2.5 - 4.6 mg/dL  Hemoglobin D1S  Result Value Ref Range   Hgb A1c MFr Bld 5.9 (H) 4.8 - 5.6 %   Mean Plasma Glucose 122.63 mg/dL  Basic metabolic panel  Result Value Ref Range   Sodium 133 (L) 135 - 145 mmol/L   Potassium 3.2 (L) 3.5 - 5.1 mmol/L   Chloride 99 98 - 111 mmol/L   CO2 24 22 - 32 mmol/L   Glucose, Bld 104 (H) 70 - 99 mg/dL   BUN 25 (H) 8 - 23 mg/dL   Creatinine, Ser 9.70 (H) 0.44 - 1.00 mg/dL   Calcium 8.1 (L) 8.9 - 10.3 mg/dL   GFR, Estimated 41 (L) >60 mL/min   Anion gap 10 5 - 15  Triglycerides  Result Value Ref Range   Triglycerides 124 <150 mg/dL  Glucose, capillary  Result Value Ref Range   Glucose-Capillary 123 (H) 70 - 99 mg/dL  Glucose, capillary  Result Value Ref Range   Glucose-Capillary 118 (H) 70 - 99 mg/dL  Glucose, capillary  Result Value Ref Range   Glucose-Capillary 107 (H) 70 - 99 mg/dL  Magnesium  Result Value Ref Range   Magnesium 1.9  1.7 - 2.4 mg/dL  Glucose, capillary  Result Value Ref Range   Glucose-Capillary 112 (H) 70 - 99 mg/dL  Phosphorus  Result Value Ref Range   Phosphorus 2.5 2.5 - 4.6 mg/dL  Glucose, capillary  Result Value Ref Range   Glucose-Capillary 136 (H) 70 - 99 mg/dL  CBC  Result Value Ref Range   WBC 8.3 4.0 - 10.5 K/uL   RBC 2.98 (L) 3.87 - 5.11 MIL/uL   Hemoglobin 9.3 (L) 12.0 - 15.0 g/dL   HCT 28.6 (L) 36.0 - 46.0 %   MCV 96.0 80.0 - 100.0 fL   MCH 31.2 26.0 - 34.0 pg   MCHC 32.5 30.0 - 36.0 g/dL   RDW 13.9 11.5 - 15.5 %   Platelets 224 150 - 400 K/uL   nRBC 0.0 0.0 - 0.2 %  Glucose, capillary  Result Value Ref Range   Glucose-Capillary 121 (H) 70 - 99 mg/dL  Glucose, capillary  Result Value Ref Range   Glucose-Capillary 103 (H) 70 - 99 mg/dL  Basic metabolic panel  Result Value Ref Range   Sodium 134 (L) 135 - 145 mmol/L   Potassium 3.7 3.5 - 5.1 mmol/L   Chloride 104 98 - 111 mmol/L   CO2 21 (L) 22 - 32 mmol/L   Glucose, Bld 106 (H) 70 - 99 mg/dL   BUN 23 8 - 23 mg/dL    Creatinine, Ser 1.23 (H) 0.44 - 1.00 mg/dL   Calcium 8.0 (L) 8.9 - 10.3 mg/dL   GFR, Estimated 44 (L) >60 mL/min   Anion gap 9 5 - 15  Hepatic function panel  Result Value Ref Range   Total Protein 5.0 (L) 6.5 - 8.1 g/dL   Albumin 1.9 (L) 3.5 - 5.0 g/dL   AST 13 (L) 15 - 41 U/L   ALT 7 0 - 44 U/L   Alkaline Phosphatase 35 (L) 38 - 126 U/L   Total Bilirubin 0.4 0.3 - 1.2 mg/dL   Bilirubin, Direct <0.1 0.0 - 0.2 mg/dL   Indirect Bilirubin NOT CALCULATED 0.3 - 0.9 mg/dL  Magnesium  Result Value Ref Range   Magnesium 1.7 1.7 - 2.4 mg/dL  Phosphorus  Result Value Ref Range   Phosphorus 2.5 2.5 - 4.6 mg/dL  Glucose, capillary  Result Value Ref Range   Glucose-Capillary 106 (H) 70 - 99 mg/dL  Glucose, capillary  Result Value Ref Range   Glucose-Capillary 99 70 - 99 mg/dL  Glucose, capillary  Result Value Ref Range   Glucose-Capillary 105 (H) 70 - 99 mg/dL  Basic metabolic panel  Result Value Ref Range   Sodium 133 (L) 135 - 145 mmol/L   Potassium 3.8 3.5 - 5.1 mmol/L   Chloride 103 98 - 111 mmol/L   CO2 22 22 - 32 mmol/L   Glucose, Bld 99 70 - 99 mg/dL   BUN 26 (H) 8 - 23 mg/dL   Creatinine, Ser 1.10 (H) 0.44 - 1.00 mg/dL   Calcium 8.0 (L) 8.9 - 10.3 mg/dL   GFR, Estimated 50 (L) >60 mL/min   Anion gap 8 5 - 15  Magnesium  Result Value Ref Range   Magnesium 2.1 1.7 - 2.4 mg/dL  Glucose, capillary  Result Value Ref Range   Glucose-Capillary 106 (H) 70 - 99 mg/dL  Glucose, capillary  Result Value Ref Range   Glucose-Capillary 106 (H) 70 - 99 mg/dL  Glucose, capillary  Result Value Ref Range   Glucose-Capillary 91 70 - 99 mg/dL  Glucose, capillary  Result Value Ref Range   Glucose-Capillary 100 (H) 70 - 99 mg/dL  Magnesium  Result Value Ref Range   Magnesium 1.8 1.7 - 2.4 mg/dL  Phosphorus  Result Value Ref Range   Phosphorus 2.9 2.5 - 4.6 mg/dL  Basic metabolic panel  Result Value Ref Range   Sodium 133 (L) 135 - 145 mmol/L   Potassium 3.8 3.5 - 5.1 mmol/L    Chloride 103 98 - 111 mmol/L   CO2 23 22 - 32 mmol/L   Glucose, Bld 114 (H) 70 - 99 mg/dL   BUN 24 (H) 8 - 23 mg/dL   Creatinine, Ser 0.98 0.44 - 1.00 mg/dL   Calcium 8.3 (L) 8.9 - 10.3 mg/dL   GFR, Estimated 58 (L) >60 mL/min   Anion gap 7 5 - 15  Glucose, capillary  Result Value Ref Range   Glucose-Capillary 117 (H) 70 - 99 mg/dL  Glucose, capillary  Result Value Ref Range   Glucose-Capillary 117 (H) 70 - 99 mg/dL  Glucose, capillary  Result Value Ref Range   Glucose-Capillary 108 (H) 70 - 99 mg/dL  Magnesium  Result Value Ref Range   Magnesium 1.7 1.7 - 2.4 mg/dL  Phosphorus  Result Value Ref Range   Phosphorus 3.4 2.5 - 4.6 mg/dL  Comprehensive metabolic panel  Result Value Ref Range   Sodium 136 135 - 145 mmol/L   Potassium 3.8 3.5 - 5.1 mmol/L   Chloride 104 98 - 111 mmol/L   CO2 24 22 - 32 mmol/L   Glucose, Bld 106 (H) 70 - 99 mg/dL   BUN 28 (H) 8 - 23 mg/dL   Creatinine, Ser 1.01 (H) 0.44 - 1.00 mg/dL   Calcium 8.5 (L) 8.9 - 10.3 mg/dL   Total Protein 5.3 (L) 6.5 - 8.1 g/dL   Albumin 2.1 (L) 3.5 - 5.0 g/dL   AST 11 (L) 15 - 41 U/L   ALT 7 0 - 44 U/L   Alkaline Phosphatase 43 38 - 126 U/L   Total Bilirubin 0.3 0.3 - 1.2 mg/dL   GFR, Estimated 56 (L) >60 mL/min   Anion gap 8 5 - 15  CBC  Result Value Ref Range   WBC 8.4 4.0 - 10.5 K/uL   RBC 3.36 (L) 3.87 - 5.11 MIL/uL   Hemoglobin 10.4 (L) 12.0 - 15.0 g/dL   HCT 31.4 (L) 36.0 - 46.0 %   MCV 93.5 80.0 - 100.0 fL   MCH 31.0 26.0 - 34.0 pg   MCHC 33.1 30.0 - 36.0 g/dL   RDW 13.7 11.5 - 15.5 %   Platelets 221 150 - 400 K/uL   nRBC 0.0 0.0 - 0.2 %  Glucose, capillary  Result Value Ref Range   Glucose-Capillary 112 (H) 70 - 99 mg/dL  Glucose, capillary  Result Value Ref Range   Glucose-Capillary 123 (H) 70 - 99 mg/dL  Glucose, capillary  Result Value Ref Range   Glucose-Capillary 105 (H) 70 - 99 mg/dL  Glucose, capillary  Result Value Ref Range   Glucose-Capillary 101 (H) 70 - 99 mg/dL  Phosphorus   Result Value Ref Range   Phosphorus 3.0 2.5 - 4.6 mg/dL  Magnesium  Result Value Ref Range   Magnesium 1.6 (L) 1.7 - 2.4 mg/dL  Basic metabolic panel  Result Value Ref Range   Sodium 135 135 - 145 mmol/L   Potassium 3.7 3.5 - 5.1 mmol/L   Chloride 102 98 - 111 mmol/L   CO2 24 22 - 32 mmol/L  Glucose, Bld 109 (H) 70 - 99 mg/dL   BUN 27 (H) 8 - 23 mg/dL   Creatinine, Ser 0.87 0.44 - 1.00 mg/dL   Calcium 8.3 (L) 8.9 - 10.3 mg/dL   GFR, Estimated >60 >60 mL/min   Anion gap 9 5 - 15  CBC  Result Value Ref Range   WBC 9.6 4.0 - 10.5 K/uL   RBC 3.21 (L) 3.87 - 5.11 MIL/uL   Hemoglobin 9.7 (L) 12.0 - 15.0 g/dL   HCT 29.9 (L) 36.0 - 46.0 %   MCV 93.1 80.0 - 100.0 fL   MCH 30.2 26.0 - 34.0 pg   MCHC 32.4 30.0 - 36.0 g/dL   RDW 13.4 11.5 - 15.5 %   Platelets 214 150 - 400 K/uL   nRBC 0.0 0.0 - 0.2 %  Glucose, capillary  Result Value Ref Range   Glucose-Capillary 101 (H) 70 - 99 mg/dL  Glucose, capillary  Result Value Ref Range   Glucose-Capillary 100 (H) 70 - 99 mg/dL  Glucose, capillary  Result Value Ref Range   Glucose-Capillary 110 (H) 70 - 99 mg/dL  Glucose, capillary  Result Value Ref Range   Glucose-Capillary 105 (H) 70 - 99 mg/dL  Magnesium  Result Value Ref Range   Magnesium 2.1 1.7 - 2.4 mg/dL  Phosphorus  Result Value Ref Range   Phosphorus 3.2 2.5 - 4.6 mg/dL  Basic metabolic panel  Result Value Ref Range   Sodium 134 (L) 135 - 145 mmol/L   Potassium 4.1 3.5 - 5.1 mmol/L   Chloride 104 98 - 111 mmol/L   CO2 22 22 - 32 mmol/L   Glucose, Bld 99 70 - 99 mg/dL   BUN 30 (H) 8 - 23 mg/dL   Creatinine, Ser 1.01 (H) 0.44 - 1.00 mg/dL   Calcium 8.3 (L) 8.9 - 10.3 mg/dL   GFR, Estimated 56 (L) >60 mL/min   Anion gap 8 5 - 15  Glucose, capillary  Result Value Ref Range   Glucose-Capillary 90 70 - 99 mg/dL  Glucose, capillary  Result Value Ref Range   Glucose-Capillary 81 70 - 99 mg/dL  Glucose, capillary  Result Value Ref Range   Glucose-Capillary 100 (H) 70  - 99 mg/dL  Basic metabolic panel  Result Value Ref Range   Sodium 132 (L) 135 - 145 mmol/L   Potassium 3.8 3.5 - 5.1 mmol/L   Chloride 104 98 - 111 mmol/L   CO2 23 22 - 32 mmol/L   Glucose, Bld 101 (H) 70 - 99 mg/dL   BUN 29 (H) 8 - 23 mg/dL   Creatinine, Ser 1.06 (H) 0.44 - 1.00 mg/dL   Calcium 8.4 (L) 8.9 - 10.3 mg/dL   GFR, Estimated 53 (L) >60 mL/min   Anion gap 5 5 - 15  CBC  Result Value Ref Range   WBC 7.1 4.0 - 10.5 K/uL   RBC 2.82 (L) 3.87 - 5.11 MIL/uL   Hemoglobin 8.5 (L) 12.0 - 15.0 g/dL   HCT 26.5 (L) 36.0 - 46.0 %   MCV 94.0 80.0 - 100.0 fL   MCH 30.1 26.0 - 34.0 pg   MCHC 32.1 30.0 - 36.0 g/dL   RDW 13.9 11.5 - 15.5 %   Platelets 203 150 - 400 K/uL   nRBC 0.0 0.0 - 0.2 %  Magnesium  Result Value Ref Range   Magnesium 1.9 1.7 - 2.4 mg/dL  Differential  Result Value Ref Range   Neutrophils Relative % 73 %  Neutro Abs 4.9 1.7 - 7.7 K/uL   Lymphocytes Relative 15 %   Lymphs Abs 1.0 0.7 - 4.0 K/uL   Monocytes Relative 10 %   Monocytes Absolute 0.7 0.1 - 1.0 K/uL   Eosinophils Relative 2 %   Eosinophils Absolute 0.2 0.0 - 0.5 K/uL   Basophils Relative 0 %   Basophils Absolute 0.0 0.0 - 0.1 K/uL   Immature Granulocytes 0 %   Abs Immature Granulocytes 0.02 0.00 - 0.07 K/uL  Comprehensive metabolic panel  Result Value Ref Range   Sodium 134 (L) 135 - 145 mmol/L   Potassium 3.7 3.5 - 5.1 mmol/L   Chloride 104 98 - 111 mmol/L   CO2 24 22 - 32 mmol/L   Glucose, Bld 86 70 - 99 mg/dL   BUN 24 (H) 8 - 23 mg/dL   Creatinine, Ser 1.04 (H) 0.44 - 1.00 mg/dL   Calcium 8.2 (L) 8.9 - 10.3 mg/dL   Total Protein 5.4 (L) 6.5 - 8.1 g/dL   Albumin 2.0 (L) 3.5 - 5.0 g/dL   AST 18 15 - 41 U/L   ALT 13 0 - 44 U/L   Alkaline Phosphatase 56 38 - 126 U/L   Total Bilirubin 0.4 0.3 - 1.2 mg/dL   GFR, Estimated 54 (L) >60 mL/min   Anion gap 6 5 - 15  Magnesium  Result Value Ref Range   Magnesium 1.9 1.7 - 2.4 mg/dL  Phosphorus  Result Value Ref Range   Phosphorus 3.3  2.5 - 4.6 mg/dL  CBC  Result Value Ref Range   WBC 6.8 4.0 - 10.5 K/uL   RBC 2.83 (L) 3.87 - 5.11 MIL/uL   Hemoglobin 8.8 (L) 12.0 - 15.0 g/dL   HCT 26.7 (L) 36.0 - 46.0 %   MCV 94.3 80.0 - 100.0 fL   MCH 31.1 26.0 - 34.0 pg   MCHC 33.0 30.0 - 36.0 g/dL   RDW 13.8 11.5 - 15.5 %   Platelets 214 150 - 400 K/uL   nRBC 0.0 0.0 - 0.2 %  Triglycerides  Result Value Ref Range   Triglycerides 101 <150 mg/dL  Prealbumin  Result Value Ref Range   Prealbumin 13.8 (L) 18 - 38 mg/dL  Basic metabolic panel  Result Value Ref Range   Sodium 132 (L) 135 - 145 mmol/L   Potassium 3.5 3.5 - 5.1 mmol/L   Chloride 100 98 - 111 mmol/L   CO2 23 22 - 32 mmol/L   Glucose, Bld 80 70 - 99 mg/dL   BUN 20 8 - 23 mg/dL   Creatinine, Ser 1.10 (H) 0.44 - 1.00 mg/dL   Calcium 8.5 (L) 8.9 - 10.3 mg/dL   GFR, Estimated 50 (L) >60 mL/min   Anion gap 9 5 - 15  Basic metabolic panel  Result Value Ref Range   Sodium 133 (L) 135 - 145 mmol/L   Potassium 3.1 (L) 3.5 - 5.1 mmol/L   Chloride 101 98 - 111 mmol/L   CO2 24 22 - 32 mmol/L   Glucose, Bld 86 70 - 99 mg/dL   BUN 18 8 - 23 mg/dL   Creatinine, Ser 1.12 (H) 0.44 - 1.00 mg/dL   Calcium 8.3 (L) 8.9 - 10.3 mg/dL   GFR, Estimated 49 (L) >60 mL/min   Anion gap 8 5 - 15  Type and screen [facility]  Result Value Ref Range   ABO/RH(D) O POS    Antibody Screen POS    Sample Expiration 10/09/2020,2359  Antibody Identification      NON SPECIFIC ANTIBODY REACTIVITY Performed at [facility], [address] [place], [place] [address]       Assessment & Plan:   Problem List Items Addressed This Visit   None   Visit Diagnoses    [id number]    -  Primary   On quarantine at SNF. Continue to monitor. Call with any concerns.    Relevant Orders   AMB Referral to [facility] Coordinaton   Physical deconditioning       Not doing well, family would like to bring her home. Will need commode, wheelchair and hoyer lift and  hospital bed. Will try to arrange for when she gets home.    Relevant Orders   [facility]B [facility] to [facility]   Moderate protein-calorie malnutrition ([place])       Encouraged eating as able, boosts and ensures as well. Continue to monitor.    Memory loss due to medical condition       Will further evaluate when she is feeling better.        Follow up plan: Return if symptoms worsen or fail to improve.   . This visit was completed via MyChart due to the restrictions of the COVID-19 pandemic. All issues as above were discussed and addressed. Physical exam was done as above through visual confirmation on MyChart. If it was felt that the patient should be evaluated in the office, they were directed there. The patient verbally consented to this visit. . Location of the patient: home . Location of the provider: work . Those involved with this call:  . Provider: Park Liter, DO . CMA: Louanna Raw, [place] . Front Desk/Registration: Barth Kirks  . Time spent on call: 25 minutes with patient face to face via video conference. More than 50% of this time was spent in counseling and coordination of care. 40 minutes total spent in review of patient's record and preparation of their chart.

## 2020-11-23 ENCOUNTER — Ambulatory Visit: Payer: Medicare HMO | Admitting: Licensed Clinical Social Worker

## 2020-11-23 DIAGNOSIS — J449 Chronic obstructive pulmonary disease, unspecified: Secondary | ICD-10-CM | POA: Diagnosis not present

## 2020-11-23 DIAGNOSIS — I10 Essential (primary) hypertension: Secondary | ICD-10-CM | POA: Diagnosis not present

## 2020-11-23 DIAGNOSIS — R52 Pain, unspecified: Secondary | ICD-10-CM | POA: Diagnosis not present

## 2020-11-23 DIAGNOSIS — K219 Gastro-esophageal reflux disease without esophagitis: Secondary | ICD-10-CM | POA: Diagnosis not present

## 2020-11-23 DIAGNOSIS — I4891 Unspecified atrial fibrillation: Secondary | ICD-10-CM | POA: Diagnosis not present

## 2020-11-23 DIAGNOSIS — M6281 Muscle weakness (generalized): Secondary | ICD-10-CM | POA: Diagnosis not present

## 2020-11-23 DIAGNOSIS — R2681 Unsteadiness on feet: Secondary | ICD-10-CM | POA: Diagnosis not present

## 2020-11-23 DIAGNOSIS — R5381 Other malaise: Secondary | ICD-10-CM

## 2020-11-23 DIAGNOSIS — E038 Other specified hypothyroidism: Secondary | ICD-10-CM | POA: Diagnosis not present

## 2020-11-23 DIAGNOSIS — R413 Other amnesia: Secondary | ICD-10-CM

## 2020-11-23 DIAGNOSIS — N183 Chronic kidney disease, stage 3 unspecified: Secondary | ICD-10-CM

## 2020-11-23 DIAGNOSIS — J441 Chronic obstructive pulmonary disease with (acute) exacerbation: Secondary | ICD-10-CM

## 2020-11-23 DIAGNOSIS — E039 Hypothyroidism, unspecified: Secondary | ICD-10-CM

## 2020-11-23 NOTE — Chronic Care Management (AMB) (Signed)
Chronic Care Management    Clinical Social Work Follow Up Note  11/23/2020 Name: Cathy Solis MRN: [id number] DOB: 04/29/1941  Cathy Solis is a 80 y.o. year old female who is a primary care patient of Valerie Roys, DO. The CCM team was consulted for assistance with Level of Care Concerns.   Review of patient status, including review of consultants reports, other relevant assessments, and collaboration with appropriate care team members and the patient's provider was performed as part of comprehensive patient evaluation and provision of chronic care management services.    SDOH (Social Determinants of Health) assessments performed: Yes    Outpatient Encounter Medications as of 11/23/2020  Medication Sig  . acetaminophen (TYLENOL) 325 MG tablet Take 2 tablets (650 mg total) by mouth every 6 (six) hours as needed for mild pain (or Fever >/= 101).  [name] albuterol (VENTOLIN HFA) 108 (90 Base) MCG/ACT inhaler Inhale 2 puffs into the lungs every 6 (six) hours as needed for wheezing or shortness of breath.  [name] apixaban (ELIQUIS) 2.5 MG TABS tablet Take 1 tablet (2.5 mg total) by mouth 2 (two) times daily.  . fluticasone furoate-vilanterol (BREO ELLIPTA) 200-25 MCG/INH AEPB Inhale 1 puff into the lungs daily.  [name] ipratropium-albuterol (DUONEB) 0.5-2.5 (3) MG/3ML SOLN Take 3 mLs by nebulization 2 (two) times daily for 4 days. DuoNeb nebulizer treatments twice a day for 4 days followed by as needed for severe shortness of breath  . levothyroxine (SYNTHROID) 150 MCG tablet Take 1 tablet (150 mcg total) by mouth daily.  [name] lisinopril (ZESTRIL) 10 MG tablet Take 1 tablet (10 mg total) by mouth daily. TAKE 1 TABLET BY MOUTH EVERY DAY  . magnesium oxide (MAG-OX) 400 (241.3 Mg) MG tablet Take 1 tablet (400 mg total) by mouth 2 (two) times daily.  . metoprolol tartrate (LOPRESSOR) 50 MG tablet Take 1 tablet (50 mg total) by mouth 2 (two) times daily.  . nicotine (NICODERM CQ - DOSED IN MG/24 HOURS) 21 mg/24hr  patch Place 1 patch (21 mg total) onto the skin daily.  . ondansetron (ZOFRAN) 4 MG tablet Take 1 tablet (4 mg total) by mouth every 8 (eight) hours as needed for nausea or vomiting.  . pantoprazole (PROTONIX) 40 MG tablet Take 1 tablet (40 mg total) by mouth daily.  . phenol (CHLORASEPTIC) 1.4 % LIQD Use as directed 1 spray in the mouth or throat as needed for throat irritation / pain.  . polyethylene glycol (MIRALAX / GLYCOLAX) 17 g packet Take 17 g by mouth daily as needed for moderate constipation.  . sucralfate (CARAFATE) 1 GM/10ML suspension Take 10 mLs (1 g total) by mouth 4 (four) times daily -  with meals and at bedtime for 7 days.   No facility-administered encounter medications on file as of 11/23/2020.     Goals Addressed    . SW-Keeping Myself/Loved One Safe       Timeframe:  Long-Range Goal Priority:  Medium Start Date:  11/23/20                           Expected End Date:  01/21/21                    Follow Up Date - 90 days from 11/23/20   - avoid busy places that are confusing such as the shopping malls - check hot water heater temperature, turn down if too hot - get rid of poisonous plants -  keep medicine where it is safe or locked - keep power tools, knives and cleaning products in a safe place - put deadbolts either high or low on outside doors - put car keys out of sight - remove knobs from stove and oven - remove vitamins, medicine, sugar substitutes and seasonings from the kitchen table and counters - supervise the use of tobacco and alcohol - use a GPS device in the car - use an audio or video monitor - use appliances that have an auto shut-off feature - use motion-sensor alarms outdoors, in the bedroom and the kitchen - use seat cushions, floor mats and bed pads that are wired to alarm when getting up    Why is this important?    Safety is important when you or your loved one has dementia or memory loss.   Eyesight, hearing and changes in feelings of hot and  cold or depth-perception may occur.   Wandering or getting lost may happen.   You/your loved one may have to stop driving.   Taking steps to improve safety can prevent injuries.   It will also help you/your loved one feel more relaxed.   It will help you/your loved one be independent for a longer time.     CARE PLAN ENTRY (see longitudinal plan of care for additional care plan information)  Current Barriers:  . Limited social support . Level of care concerns . Family and relationship dysfunction (patient resist help from family in fear of losing her independence) . Social Isolation . Limited access to caregiver . Cognitive Deficits . Memory Deficits  Clinical Social Work Clinical Goal(s):   [name] Over the next 120 days, patient/caregiver will work with SW to address concerns related to care coordination needs, lack of a stable support network and lack of Brewing technologist. LCSW will assist patient in gaining additional support in order to maintain health and increase her level of support in the home as family is concerned for her well-being as she lives alone and has difficulty providing appropriate care to herself.  . Over the next 120 days, patient will demonstrate improved adherence to self care as evidenced by implementing healthy self-care into her daily routine such as: attending all medical appointments, deep breathing exercises, taking time for self-reflection, taking medications as prescribed, drinking water and daily exercise to improve mobility and mood. . Over the next 120 days, patient will demonstrate improved health management independence as evidenced by implementing healthy self-care skills and positive support/resources into her daily routine to help cope with stressors and improve overall health and well-being  . Over the next 120 days, patient or caregiver will verbalize basic understanding of depression/stress process and self health management plan  as evidenced by her participation in development of long term plan of care and institution of self health management strategies  Interventions: . Inter-disciplinary care team collaboration (see longitudinal plan of care) . Patient interviewed and appropriate assessments performed. LCSW spoke with patient's sister [name] on 09/28/20 and 11/23/20.  . Patient went to the ED on 09/27/20 after having back pain and nausea for the past 3 days. Patient had been sitting in her recliner and had not been getting up much for the last 3 days even after family encouraged her to go to the hospital. Patient would not allow her sisters to stay with her either even though she was unable to care for herself. Patient has stage 3 kidney disease and arrived at ED with foul smelling urine. Patient only  has one kidney. Patient has lost 13 pounds in the last 60 days. Per sister, patient has a kidney infection, kidney stones and urine infection. She shares that her son is going to the hospital today to visit patient. He is her POA. Family are concerned that patient has limited support within the home and lives alone. Family report that "she often fights [place]. She didn't even want her son to come to town." Patient is often non-compliant and is fearful of LTC placement.  UPDATE- Patient has been at [facility] since ED visit on 09/27/20. Patient currently has COVID and her support network (two sisters) are unable to visit her in the facility which has increased patient's isolation. Before patient had COVID, sisters went to facility every morning to provide coffee, breakfast and socialization. Family reports that there is NO set discharge date but they are hopeful to eventually have patient relocate back home with them. However, they were advised that patient would need 24/7 care if she were to discharge back home from [facility] and family is unsure if they can met this need.  . Patient smokes heavily per sister.  . Family report that  patient sometimes is forgetful and suspect dementia. Patient's cognition level changes per day per family.  . Per sister, son has offered to pay out of pocket for an aide but she declined wanting someone in her home.  [name] LCSW educated family on petitioning the court system for guardianship and provided contact number for [employer] ([place]) . Provided patient with information about level of care resources including: Museum/gallery curator, PACE, ALF, SNF for rehab or long term, Adult Day Centers, [place], Private pay caregiving and [facility] Family report that patient does not want to increase her level of care in fear of losing her independence.  . Discussed plans with patient for ongoing care management follow up and provided patient with direct contact information for care management team . Advised patient's caregiver to contact CCM team for any case management needs  . Assisted patient/caregiver with obtaining information about health plan benefits . Provided education to patient/caregiver regarding level of care options.  Patient Self Care Activities:  . Attends all scheduled provider appointments . Lacks social connections . Has a difficult time asking for help sometimes     Patient Care Plan: General Social Work (Adult)    Problem Identified: Diminished Energy     Long-Range Goal: Energy Optimized   Start Date: 10/12/2020  Note:   Evidence-based guidance:  Screen for presence of fatigue using a validated scale, quantitative (1 to 10) or word scale (mild, moderate, severe) at initial visit and at regular intervals.  Identify onset, pattern, duration, change over time, alleviating and contributing factors, as well as degree of interference with daily function.  Assess for and correlate reversible factors and comorbidities such as anxiety, depression, anemia, thyroid disease and sleep-disordered breathing; treat underlying disorders.   Screen for obstructive sleep apnea; prepare patient for polysomnography based on risk and presentation.   Prepare patient for use of long-term oxygen and noninvasive ventilation to relieve hypercapnia, hypoxemia, obstructive sleep apnea and reduce fatigue.   Suggest keeping a symptom diary or journal to detect pattern of fatigue.  Provide anticipatory guidance regarding precipitating factors including heat, over-exertion, stress, presence of treatment effects such as anemia, fever, infection and history or risk of falls.  Encourage physical activity and exercise such as aerobic exercise, yoga, balance and progressive resistance training; consider beginning with  low-level activities and increase as conditioning and confidence improves.  Promote energy conservation techniques that include balancing work and rest, adjusting activities to save energy, asking for help, prioritizing work and activities, and using efficient body mechanics.  Encourage the use of adaptive and/or assistive equipment.  Prepare patient for the use of pharmacologic therapy; review efficacy, tolerability and adherence; manage medication-induced side effects.  Encourage the use of complementary or alternative therapy, alone or in combination, yoga, cognitive behavioral interventions, tai chi, cooling vest or cap.   Notes:    CARE PLAN ENTRY (see longitudinal plan of care for additional care plan information)  Current Barriers:  . Limited social support . Level of care concerns . Family and relationship dysfunction (patient resist help from family in fear of losing her independence) . Social Isolation . Limited access to caregiver . Cognitive Deficits . Memory Deficits  Clinical Social Work Clinical Goal(s):   [name] Over the next 120 days, patient/caregiver will work with SW to address concerns related to care coordination needs, lack of a stable support network and lack of Brewing technologist. LCSW will  assist patient in gaining additional support in order to maintain health and increase her level of support in the home as family is concerned for her well-being as she lives alone and has difficulty providing appropriate care to herself.  . Over the next 120 days, patient will demonstrate improved adherence to self care as evidenced by implementing healthy self-care into her daily routine such as: attending all medical appointments, deep breathing exercises, taking time for self-reflection, taking medications as prescribed, drinking water and daily exercise to improve mobility and mood. . Over the next 120 days, patient will demonstrate improved health management independence as evidenced by implementing healthy self-care skills and positive support/resources into her daily routine to help cope with stressors and improve overall health and well-being  . Over the next 120 days, patient or caregiver will verbalize basic understanding of depression/stress process and self health management plan as evidenced by her participation in development of long term plan of care and institution of self health management strategies  Interventions: . Inter-disciplinary care team collaboration (see longitudinal plan of care) . Patient interviewed and appropriate assessments performed. LCSW spoke with patient's sister [name] and patient on 10/12/20. Patient is back in the hospital. . Patient was recently hospitalized on 09/27/2020 and discharged on 10/05/2020 to [facility] for short term rehab but had to return back to the hospital for several health concerns. Patient initially went to the ED on 09/27/20 after having back pain and nausea for the past 3 days. Patient had been sitting in her recliner and had not been getting up much for the last 3 days even after family encouraged her to go to the hospital. Patient would not allow her sisters to stay with her either even though she was unable to care for herself. Patient has  stage 3 kidney disease and arrived at ED with foul smelling urine. Patient only has one kidney. Patient has lost 13 pounds in the last 60 days. Per sister, patient has a kidney infection, kidney stones and urine infection. Son is her POA. Family are concerned that patient has limited support within the home and lives alone. Family report that "she often fights [place]. She didn't even want her son to come to town." Patient is often non-compliant and is fearful of LTC placement. Patient's son lives in [place] and makes several trips to see patient even though patient is weary about  any family involvement. . Patient smokes heavily per sister.  . Family report that patient sometimes is forgetful and suspect dementia. Patient's cognition level changes per day per family.  . Per sister, son has offered to pay out of pocket for an aide but she declined wanting someone in her home.  [name] LCSW educated family on petitioning the court system for guardianship and provided contact number for [employer] ([place]) . Provided patient with information about level of care resources including: Museum/gallery curator, PACE, ALF, SNF for rehab or long term, Adult Day Centers, [place], Private pay caregiving and [facility] Family report that patient does not want to increase her level of care in fear of losing her independence.  . Discussed plans with patient for ongoing care management follow up and provided patient with direct contact information for care management team . Advised patient's caregiver to contact CCM team for any case management needs  . Sister is concerned that patient continues to drive. Sister states that she even almost run over herself one day because of her inability to drive safely.  . Assisted patient/caregiver with obtaining information about health plan benefits . Provided education to patient/caregiver regarding level of care options.  Patient Self Care  Activities:  . Attends all scheduled provider appointments . Lacks social connections . Has a difficult time asking for help sometimes      Task: Facilitate Activities to Optimize Energy for Daily Living   Note:   Care Management Activities:    - alternating periods of activity with rest promoted - assistive or adaptive device use encouraged - complementary and alternative therapies explored - cooling vest encouraged - energy conservation techniques promoted - fear of pain acknowledged - quality of sleep assessed - relaxation techniques promoted - response to pharmacologic therapy monitored - screening for fatigue completed - sleep hygiene techniques encouraged - strategies to alleviate barriers to physical activity or exercise promoted - symptom diary reviewed - symptom diary use promoted    Notes:    Long-Range Goal: Protect My Health   Start Date: 10/12/2020  Note:   Follow Up Date 90 days from 10/12/20   - schedule appointment for flu shot - schedule appointment for vaccines needed due to my age or health - schedule recommended health tests (blood work, mammogram, colonoscopy, pap test) - schedule and keep appointment for annual check-up    Why is this important?   Screening tests can find diseases early when they are easier to treat.  Your doctor or nurse will talk with you about which tests are important for you.  Getting shots for common diseases like the flu and shingles will help prevent them.     Notes:      Follow Up Plan: SW will follow up with patient by phone over the next  quarter  Eula Fried, BSW, MSW, [email or web address] Phone: [phone]

## 2020-11-23 NOTE — Chronic Care Management (AMB) (Signed)
Chronic Care Management    Clinical Social Work Follow Up Note  11/23/2020 Name: Cathy Solis MRN: [id number] DOB: Oct 05, 1941  Cathy Solis is a 80 y.o. year old female who is a primary care patient of Valerie Roys, DO. The CCM team was consulted for assistance with Level of Care Concerns.   Review of patient status, including review of consultants reports, other relevant assessments, and collaboration with appropriate care team members and the patient's provider was performed as part of comprehensive patient evaluation and provision of chronic care management services. CCM LCSW completed additional outreach call to patent's son on 11/24/19.   SDOH (Social Determinants of Health) assessments performed: Yes    Outpatient Encounter Medications as of 11/23/2020  Medication Sig  . acetaminophen (TYLENOL) 325 MG tablet Take 2 tablets (650 mg total) by mouth every 6 (six) hours as needed for mild pain (or Fever >/= 101).  [name] albuterol (VENTOLIN HFA) 108 (90 Base) MCG/ACT inhaler Inhale 2 puffs into the lungs every 6 (six) hours as needed for wheezing or shortness of breath.  [name] apixaban (ELIQUIS) 2.5 MG TABS tablet Take 1 tablet (2.5 mg total) by mouth 2 (two) times daily.  . fluticasone furoate-vilanterol (BREO ELLIPTA) 200-25 MCG/INH AEPB Inhale 1 puff into the lungs daily.  [name] ipratropium-albuterol (DUONEB) 0.5-2.5 (3) MG/3ML SOLN Take 3 mLs by nebulization 2 (two) times daily for 4 days. DuoNeb nebulizer treatments twice a day for 4 days followed by as needed for severe shortness of breath  . levothyroxine (SYNTHROID) 150 MCG tablet Take 1 tablet (150 mcg total) by mouth daily.  [name] lisinopril (ZESTRIL) 10 MG tablet Take 1 tablet (10 mg total) by mouth daily. TAKE 1 TABLET BY MOUTH EVERY DAY  . magnesium oxide (MAG-OX) 400 (241.3 Mg) MG tablet Take 1 tablet (400 mg total) by mouth 2 (two) times daily.  . metoprolol tartrate (LOPRESSOR) 50 MG tablet Take 1 tablet (50 mg total) by mouth 2 (two)  times daily.  . nicotine (NICODERM CQ - DOSED IN MG/24 HOURS) 21 mg/24hr patch Place 1 patch (21 mg total) onto the skin daily.  . ondansetron (ZOFRAN) 4 MG tablet Take 1 tablet (4 mg total) by mouth every 8 (eight) hours as needed for nausea or vomiting.  . pantoprazole (PROTONIX) 40 MG tablet Take 1 tablet (40 mg total) by mouth daily.  . phenol (CHLORASEPTIC) 1.4 % LIQD Use as directed 1 spray in the mouth or throat as needed for throat irritation / pain.  . polyethylene glycol (MIRALAX / GLYCOLAX) 17 g packet Take 17 g by mouth daily as needed for moderate constipation.  . sucralfate (CARAFATE) 1 GM/10ML suspension Take 10 mLs (1 g total) by mouth 4 (four) times daily -  with meals and at bedtime for 7 days.   No facility-administered encounter medications on file as of 11/23/2020.     Goals Addressed    . SW-Keeping Myself/Loved One Safe       Timeframe:  Long-Range Goal Priority:  Medium Start Date:  11/23/20                           Expected End Date:  01/21/21                    Follow Up Date - 90 days from 11/23/20   - avoid busy places that are confusing such as the shopping malls - check hot water heater temperature, turn  down if too hot - get rid of poisonous plants - keep medicine where it is safe or locked - keep power tools, knives and cleaning products in a safe place - put deadbolts either high or low on outside doors - put car keys out of sight - remove knobs from stove and oven - remove vitamins, medicine, sugar substitutes and seasonings from the kitchen table and counters - supervise the use of tobacco and alcohol - use a GPS device in the car - use an audio or video monitor - use appliances that have an auto shut-off feature - use motion-sensor alarms outdoors, in the bedroom and the kitchen - use seat cushions, floor mats and bed pads that are wired to alarm when getting up    Why is this important?    Safety is important when you or your loved one has dementia  or memory loss.   Eyesight, hearing and changes in feelings of hot and cold or depth-perception may occur.   Wandering or getting lost may happen.   You/your loved one may have to stop driving.   Taking steps to improve safety can prevent injuries.   It will also help you/your loved one feel more relaxed.   It will help you/your loved one be independent for a longer time.     CARE PLAN ENTRY (see longitudinal plan of care for additional care plan information)  Current Barriers:  . Limited social support . Level of care concerns . Family and relationship dysfunction (patient resist help from family in fear of losing her independence) . Social Isolation . Limited access to caregiver . Cognitive Deficits . Memory Deficits  Clinical Social Work Clinical Goal(s):   [name] Over the next 120 days, patient/caregiver will work with SW to address concerns related to care coordination needs, lack of a stable support network and lack of Brewing technologist. LCSW will assist patient in gaining additional support in order to maintain health and increase her level of support in the home as family is concerned for her well-being as she lives alone and has difficulty providing appropriate care to herself.  . Over the next 120 days, patient will demonstrate improved adherence to self care as evidenced by implementing healthy self-care into her daily routine such as: attending all medical appointments, deep breathing exercises, taking time for self-reflection, taking medications as prescribed, drinking water and daily exercise to improve mobility and mood. . Over the next 120 days, patient will demonstrate improved health management independence as evidenced by implementing healthy self-care skills and positive support/resources into her daily routine to help cope with stressors and improve overall health and well-being  . Over the next 120 days, patient or caregiver will verbalize basic  understanding of depression/stress process and self health management plan as evidenced by her participation in development of long term plan of care and institution of self health management strategies  Interventions: . Inter-disciplinary care team collaboration (see longitudinal plan of care) . Patient interviewed and appropriate assessments performed. LCSW spoke with patient's sister [name] on 09/28/20 and 11/23/20. CCM LCSW also spoke with patient's son on 11/23/20. . Patient went to the ED on 09/27/20 after having back pain and nausea for the past 3 days. Patient had been sitting in her recliner and had not been getting up much for the last 3 days even after family encouraged her to go to the hospital. Patient would not allow her sisters to stay with her either even though she was unable to  care for herself. Patient has stage 3 kidney disease and arrived at ED with foul smelling urine. Patient only has one kidney. Patient has lost 13 pounds in the last 60 days. Per sister, patient has a kidney infection, kidney stones and urine infection. She shares that her son is going to the hospital today to visit patient. He is her POA. Family are concerned that patient has limited support within the home and lives alone. Family report that "she often fights [place]. She didn't even want her son to come to town." Patient is often non-compliant and is fearful of LTC placement.  UPDATE- Patient has been at [facility] since ED visit on 09/27/20. Patient currently has COVID and her support network (two sisters) are unable to visit her in the facility which has increased patient's isolation. Before patient had COVID, sisters went to facility every morning to provide coffee, breakfast and socialization. Family reports that there is NO set discharge date but they are hopeful to eventually have patient relocate back home with them. However, they were advised that patient would need 24/7 care if she were to discharge back home from [facility] and family is unsure if they can met this need. Per patient's son, they are able to pay out of pocket for in home care and would like this resource information. CCM LCSW will sent secure email to patient's son with personal care service resources.  . Patient smokes heavily per sister.  . Family report that patient sometimes is forgetful and suspect dementia. Patient's cognition level changes per day per family.  . Per sister, son has offered to pay out of pocket for an aide but she declined wanting someone in her home.  [name] LCSW educated family on petitioning the court system for guardianship and provided contact number for [facility] ([place]) . Provided patient with information about level of care resources including: Museum/gallery curator, PACE, ALF, SNF for rehab or long term, Adult Day Centers, [place], Private pay caregiving and [facility] Family report that patient does not want to increase her level of care in fear of losing her independence.  . Discussed plans with patient for ongoing care management follow up and provided patient with direct contact information for care management team . Advised patient's caregiver to contact CCM team for any case management needs  . Assisted patient/caregiver with obtaining information about health plan benefits . Provided education to patient/caregiver regarding level of care options.  Patient Self Care Activities:  . Attends all scheduled provider appointments . Lacks social connections . Has a difficult time asking for help sometimes      Follow Up Plan: SW will follow up with patient by phone over the next quarter  Eula Fried, BSW, MSW, [email or web address] Phone: [phone]

## 2020-11-24 ENCOUNTER — Encounter: Payer: Self-pay | Admitting: Primary Care

## 2020-11-24 DIAGNOSIS — M6281 Muscle weakness (generalized): Secondary | ICD-10-CM | POA: Diagnosis not present

## 2020-11-24 DIAGNOSIS — I10 Essential (primary) hypertension: Secondary | ICD-10-CM | POA: Diagnosis not present

## 2020-11-24 DIAGNOSIS — R69 Illness, unspecified: Secondary | ICD-10-CM | POA: Diagnosis not present

## 2020-11-24 DIAGNOSIS — I4891 Unspecified atrial fibrillation: Secondary | ICD-10-CM | POA: Diagnosis not present

## 2020-11-24 DIAGNOSIS — U071 COVID-19: Secondary | ICD-10-CM | POA: Diagnosis not present

## 2020-11-25 ENCOUNTER — Other Ambulatory Visit: Payer: Self-pay

## 2020-11-25 ENCOUNTER — Telehealth: Payer: Self-pay | Admitting: Family Medicine

## 2020-11-25 ENCOUNTER — Non-Acute Institutional Stay: Payer: Medicare HMO | Admitting: Primary Care

## 2020-11-25 DIAGNOSIS — J449 Chronic obstructive pulmonary disease, unspecified: Secondary | ICD-10-CM | POA: Diagnosis not present

## 2020-11-25 DIAGNOSIS — K219 Gastro-esophageal reflux disease without esophagitis: Secondary | ICD-10-CM | POA: Diagnosis not present

## 2020-11-25 DIAGNOSIS — R2681 Unsteadiness on feet: Secondary | ICD-10-CM | POA: Diagnosis not present

## 2020-11-25 DIAGNOSIS — I10 Essential (primary) hypertension: Secondary | ICD-10-CM | POA: Diagnosis not present

## 2020-11-25 DIAGNOSIS — R634 Abnormal weight loss: Secondary | ICD-10-CM

## 2020-11-25 DIAGNOSIS — I4891 Unspecified atrial fibrillation: Secondary | ICD-10-CM | POA: Diagnosis not present

## 2020-11-25 DIAGNOSIS — E038 Other specified hypothyroidism: Secondary | ICD-10-CM | POA: Diagnosis not present

## 2020-11-25 DIAGNOSIS — N1832 Chronic kidney disease, stage 3b: Secondary | ICD-10-CM

## 2020-11-25 DIAGNOSIS — Z515 Encounter for palliative care: Secondary | ICD-10-CM | POA: Diagnosis not present

## 2020-11-25 DIAGNOSIS — M6281 Muscle weakness (generalized): Secondary | ICD-10-CM | POA: Diagnosis not present

## 2020-11-25 DIAGNOSIS — M810 Age-related osteoporosis without current pathological fracture: Secondary | ICD-10-CM | POA: Diagnosis not present

## 2020-11-25 DIAGNOSIS — R52 Pain, unspecified: Secondary | ICD-10-CM | POA: Diagnosis not present

## 2020-11-25 NOTE — Telephone Encounter (Signed)
Do either of you guys know anything about this or how to assist?

## 2020-11-25 NOTE — Progress Notes (Signed)
[facility] Palliative Care Consult Note Telephone: [phone]  Fax: [phone]     Date of encounter: 11/25/20 PATIENT NAME: Cathy Solis Cathy Solis [id number] [phone] (home)  DOB: 12/30/1940 MRN: [id number]  PRIMARY CARE PROVIDER:    Dr Marissa Nestle  REFERRING PROVIDER:   Dr Marissa Nestle  RESPONSIBLE PARTY:   Extended Emergency Contact Information Primary Emergency Contact: Birdena Jubilee [address] [address] [place] Phone: [phone] Relation: Sister Secondary Emergency Contact: Milea, [place] Mobile Phone: [phone] Relation: Son  I met face to face with patient in facility. Palliative Care was asked to follow this patient by consultation request of Valerie Roys, DO to help address advance care planning and goals of care. This is a follow up  visit.   ASSESSMENT AND RECOMMENDATIONS:   1. Advance Care Planning/Goals of Care: Goals include to maximize quality of life and symptom management. Our advance care planning conversation included a discussion about:     Exploration of personal, cultural or spiritual beliefs that might influence medical decisions   Exploration of goals of care in the event of a sudden injury or illness   Identification and preparation of a healthcare agent   Review and updating or creation of an  advance directive document.   2. Symptom Management:   Recommend zoloft 25 mg to begin for mood and appetite. Also recommend ATC acetaminophen CR 650 mg q 8 hrs and tramadol if appropriate.    Family is prepping to bring patient home. Family needs dme ordered and home health.   3. Follow up Palliative Care Visit: Palliative care will continue to follow for goals of care clarification and symptom management. Return 6 weeks or prn.  4. Family /Caregiver/Community Supports: Son is POA, in SNF currently  5. Cognitive / Functional decline: A and O x  1-2.  Appears to be declining due to poor intake.  I spent 35 minutes providing this consultation,  from 1200 to 1235. More than 50% of the time in this consultation was spent coordinating communication.   CODE STATUS:FULL  PPS: 30%  HOSPICE ELIGIBILITY/DIAGNOSIS: TBD  Subjective:  CHIEF COMPLAINT: debility  HISTORY OF PRESENT ILLNESS:  Cathy Solis  with cachexia,  Weight loss, COPD, covid infection, delirium. She does not seem to be improving with rehab measures.   We are asked to consult around advance care planning and complex medical decision making.    Review and summarization of old Epic records shows or history from other than patient. Review of case with family member son [name].  History obtained from review of EMR, discussion with primary team, and  interview with family, caregiver  and/or [name]. Records reviewed and summarized above.   CURRENT PROBLEM LIST:  Patient Active Problem List   Diagnosis Date Noted  . SBO (small bowel obstruction) ([place])   . Goals of care, counseling/discussion   . Palliative care by specialist   . Abdominal distention   . Duodenal obstruction   . Abdominal pain 10/06/2020  . Hematemesis with nausea   . Upper GI bleed   . UTI (urinary tract infection) 09/27/2020  . Acute pyelonephritis 09/27/2020  . Unintentional weight loss 09/27/2020  . Debility 09/27/2020  . Atrial fibrillation ([place])   . Severe protein-calorie malnutrition ([place]) 09/03/2020  . 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 Chronic kidney disease (CKD), stage III (moderate) (  [place]) 08/20/2019  . RBBB 05/09/2019  . Pressure injury of skin 01/15/2019  . COPD (chronic obstructive pulmonary disease) ([place]) 01/14/2019  . Hypertension 05/04/2018  . Osteoporosis 11/02/2017  . Coronary atherosclerosis 05/23/2016  . Personal history of tobacco use, presenting hazards to health 03/18/2016  . Idiopathic scoliosis 11/28/2014  . Tobacco abuse 11/28/2014  .  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 Hydronephrosis 02/04/2014  . 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 Carotid stenosis 05/23/2013  . Pernicious anemia 06/26/2012  . Hyperlipidemia 12/29/2011  . 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 Hypothyroidism 08/04/2011   PAST MEDICAL HISTORY:  Active Ambulatory Problems    Diagnosis Date Noted  . 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 Hypothyroidism 08/04/2011  . Hyperlipidemia 12/29/2011  . Pernicious anemia 06/26/2012  . Carotid stenosis 05/23/2013  . 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 Hydronephrosis 02/04/2014  . Idiopathic scoliosis 11/28/2014  . Tobacco abuse 11/28/2014  . 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 Personal history of tobacco use, presenting hazards to health 03/18/2016  . Coronary atherosclerosis 05/23/2016  . Osteoporosis 11/02/2017  . Hypertension 05/04/2018  . COPD (chronic obstructive pulmonary disease) ([place]) 01/14/2019  . Pressure injury of skin 01/15/2019  . RBBB 05/09/2019  . 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 Chronic kidney disease (CKD), stage III (moderate) ([place]) 08/20/2019  . Severe protein-calorie malnutrition ([place]) 09/03/2020  . UTI (urinary tract infection) 09/27/2020  . Acute pyelonephritis 09/27/2020  . Unintentional weight loss 09/27/2020  . Debility 09/27/2020  . Atrial fibrillation ([place])   . Hematemesis with nausea   . Upper GI bleed   . Abdominal pain 10/06/2020  . Abdominal distention   . Duodenal obstruction   . SBO (small bowel obstruction) ([place])   . Goals of care, counseling/discussion   . Palliative care by specialist    Resolved Ambulatory Problems    Diagnosis Date Noted  . Proteinuria 12/29/2011  . Weakness 06/26/2012  . Medicare annual wellness visit, subsequent 11/13/2013  . Postmenopausal estrogen deficiency 11/13/2013  . Chronic fatigue 06/24/2015  . Visit for  screening mammogram 06/24/2015  . Pyelonephritis 09/28/2020   Past Medical History:  Diagnosis Date  . Breast cancer ([place]) 2006  . Cancer ([place]) 2006  . Kidney problem   . 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 Stroke ([place])   . Toe infection    SOCIAL HX:  Social History   Tobacco Use  . Smoking status: Current Some Day Smoker    Packs/day: 0.50    Years: 30.00    Pack years: 15.00    Types: Cigarettes    Last attempt to quit: 01/08/2019    Years since quitting: 1.8  . Smokeless tobacco: Never Used  Substance Use Topics  . Alcohol use: No   FAMILY HX:  Family History  Problem Relation Age of Onset  . Stroke Mother   . Heart disease Father   . Heart attack Maternal Grandfather       ALLERGIES:  Allergies  Allergen Reactions  . Statins Other (See Comments)    Myalgias  . Zetia [Ezetimibe] Other (See Comments)    myalgias     PERTINENT MEDICATIONS:  Outpatient Encounter Medications as of 11/25/2020  Medication Sig  . acetaminophen (TYLENOL) 325 MG tablet Take 2 tablets (650 mg total) by mouth every 6 (six) hours as needed for mild pain (or Fever >/= 101).  [name] albuterol (VENTOLIN HFA)  108 (90 Base) MCG/ACT inhaler Inhale 2 puffs into the lungs every 6 (six) hours as needed for wheezing or shortness of breath.  [name] apixaban (ELIQUIS) 2.5 MG TABS tablet Take 1 tablet (2.5 mg total) by mouth 2 (two) times daily.  . fluticasone furoate-vilanterol (BREO ELLIPTA) 200-25 MCG/INH AEPB Inhale 1 puff into the lungs daily.  [name] ipratropium-albuterol (DUONEB) 0.5-2.5 (3) MG/3ML SOLN Take 3 mLs by nebulization 2 (two) times daily for 4 days. DuoNeb nebulizer treatments twice a day for 4 days followed by as needed for severe shortness of breath  . levothyroxine (SYNTHROID) 150 MCG tablet Take 1 tablet (150 mcg total) by mouth daily.  [name] lisinopril (ZESTRIL) 10 MG tablet Take 1 tablet (10 mg total) by mouth daily. TAKE 1 TABLET BY MOUTH EVERY DAY   . magnesium oxide (MAG-OX) 400 (241.3 Mg) MG tablet Take 1 tablet (400 mg total) by mouth 2 (two) times daily.  . metoprolol tartrate (LOPRESSOR) 50 MG tablet Take 1 tablet (50 mg total) by mouth 2 (two) times daily.  . nicotine (NICODERM CQ - DOSED IN MG/24 HOURS) 21 mg/24hr patch Place 1 patch (21 mg total) onto the skin daily.  . ondansetron (ZOFRAN) 4 MG tablet Take 1 tablet (4 mg total) by mouth every 8 (eight) hours as needed for nausea or vomiting.  . pantoprazole (PROTONIX) 40 MG tablet Take 1 tablet (40 mg total) by mouth daily.  . phenol (CHLORASEPTIC) 1.4 % LIQD Use as directed 1 spray in the mouth or throat as needed for throat irritation / pain.  . polyethylene glycol (MIRALAX / GLYCOLAX) 17 g packet Take 17 g by mouth daily as needed for moderate constipation.  . sucralfate (CARAFATE) 1 GM/10ML suspension Take 10 mLs (1 g total) by mouth 4 (four) times daily -  with meals and at bedtime for 7 days.   No facility-administered encounter medications on file as of 11/25/2020.    Objective: ROS/staff  General: agitated ENMT: denies dysphagia Pulmonary: denies  cough, denies increased SOB Abdomen: endorses poor  appetite, endorses constipation, endorses incontinence of bowel GU: denies dysuria, endorses incontinence of urine MSK:  endorses ROM limitations, no falls reported Skin: denies rashes or wounds Neurological: endorses weakness, endorses back  Pain Psych: Endorses negative mood Heme/lymph/immuno: denies bruises, abnormal bleeding  Physical Exam: Current and past weights:unavailable, cachectic Constitutional: Vital signs, NAD General: frail appearing, thin EYES: anicteric sclera,lids intact, no discharge  ENMT: intact hearing,oral mucous membranes moist CV: S1S2, RRR, no LE edema Pulmonary:no increased work of breathing, no cough, no audible wheezes, oxygen in place Abdomen: intake 25%,  no ascites GU: deferred MSK: mild sarcopenia, decreased ROM in all extremities,  no contractures of LE, non ambulatory Skin: warm and dry, no rashes or wounds on visible skin Neuro: Generalized weakness,moderate  cognitive impairment, Psych: non-anxious affect, A and O x 2 Hem/lymph/immuno: no widespread bruising   Thank you for the opportunity to participate in the care of Ms. Koplin.  The palliative care team will continue to follow. Please call our office at [phone] if we can be of additional assistance.  Jason Coop, NP , DNP, MPH, AGPCNP-BC, ACHPN  COVID-19 PATIENT SCREENING TOOL  Person answering questions: ____________staff______ _____   1.  Is the patient or any family member in the home showing any signs or symptoms regarding respiratory infection?               Person with Symptom- __________NA_________________  a. Fever  Yes___ No__x_          ___________________  b. Shortness of breath                                                    Yes___ No_x__          ___________________ c. Cough/congestion                                       Yes___  No__x_         ___________________ d. Body aches/pains                                                         Yes_x__ No___        ____________________ e. Gastrointestinal symptoms (diarrhea, nausea)           Yes___ No_x__        ____________________  2. Within the past 14 days, has anyone living in the home had any contact with someone with or under investigation for COVID-19?    Yes_x__ No__   Person __________________  covid is in nursing home.

## 2020-11-25 NOTE — Telephone Encounter (Signed)
Son Cathy Solis calling and thinks he got a call from [facility]. He would like a call back to coordinate the delivery of the home health equipment.  Pt is still at [facility] with Covid. Please call back

## 2020-11-26 ENCOUNTER — Ambulatory Visit: Payer: Medicare HMO | Admitting: Urology

## 2020-11-26 DIAGNOSIS — R2681 Unsteadiness on feet: Secondary | ICD-10-CM | POA: Diagnosis not present

## 2020-11-26 DIAGNOSIS — J449 Chronic obstructive pulmonary disease, unspecified: Secondary | ICD-10-CM | POA: Diagnosis not present

## 2020-11-26 DIAGNOSIS — M6281 Muscle weakness (generalized): Secondary | ICD-10-CM | POA: Diagnosis not present

## 2020-11-26 DIAGNOSIS — K219 Gastro-esophageal reflux disease without esophagitis: Secondary | ICD-10-CM | POA: Diagnosis not present

## 2020-11-26 DIAGNOSIS — E038 Other specified hypothyroidism: Secondary | ICD-10-CM | POA: Diagnosis not present

## 2020-11-26 DIAGNOSIS — I10 Essential (primary) hypertension: Secondary | ICD-10-CM | POA: Diagnosis not present

## 2020-11-26 DIAGNOSIS — L89152 Pressure ulcer of sacral region, stage 2: Secondary | ICD-10-CM | POA: Diagnosis not present

## 2020-11-26 DIAGNOSIS — U071 COVID-19: Secondary | ICD-10-CM | POA: Diagnosis not present

## 2020-11-26 DIAGNOSIS — R69 Illness, unspecified: Secondary | ICD-10-CM | POA: Diagnosis not present

## 2020-11-26 DIAGNOSIS — R52 Pain, unspecified: Secondary | ICD-10-CM | POA: Diagnosis not present

## 2020-11-26 DIAGNOSIS — I4891 Unspecified atrial fibrillation: Secondary | ICD-10-CM | POA: Diagnosis not present

## 2020-11-26 NOTE — Telephone Encounter (Signed)
Pam and I can't order equipment. Has to be put in by a physician I believe. Wish we could!

## 2020-11-27 DIAGNOSIS — M6281 Muscle weakness (generalized): Secondary | ICD-10-CM | POA: Diagnosis not present

## 2020-11-27 DIAGNOSIS — K219 Gastro-esophageal reflux disease without esophagitis: Secondary | ICD-10-CM | POA: Diagnosis not present

## 2020-11-27 DIAGNOSIS — I4891 Unspecified atrial fibrillation: Secondary | ICD-10-CM | POA: Diagnosis not present

## 2020-11-27 DIAGNOSIS — E038 Other specified hypothyroidism: Secondary | ICD-10-CM | POA: Diagnosis not present

## 2020-11-27 DIAGNOSIS — R2681 Unsteadiness on feet: Secondary | ICD-10-CM | POA: Diagnosis not present

## 2020-11-27 DIAGNOSIS — J449 Chronic obstructive pulmonary disease, unspecified: Secondary | ICD-10-CM | POA: Diagnosis not present

## 2020-11-27 DIAGNOSIS — I10 Essential (primary) hypertension: Secondary | ICD-10-CM | POA: Diagnosis not present

## 2020-11-27 DIAGNOSIS — R52 Pain, unspecified: Secondary | ICD-10-CM | POA: Diagnosis not present

## 2020-11-27 NOTE — Telephone Encounter (Signed)
Order was sent to [facility] [employer] [facility]. Called and confirmed with them that they do delivery some home health products.  Called and LVM asking for patients son to please return my call. He should reach out to [facility] to discuss delivery. Their number is [phone].

## 2020-11-27 NOTE — Telephone Encounter (Signed)
Patient's son returned my call. States that [facility] needs more information regarding the orders we sent over. Need office visit notes regarding why these supplies are needed for insurance. Equipment was [employer], wheelchair, hospital bed, and a bedside commode.  Can we add onto the most recent OV note for this?

## 2020-11-30 DIAGNOSIS — J449 Chronic obstructive pulmonary disease, unspecified: Secondary | ICD-10-CM | POA: Diagnosis not present

## 2020-11-30 DIAGNOSIS — I4891 Unspecified atrial fibrillation: Secondary | ICD-10-CM | POA: Diagnosis not present

## 2020-11-30 DIAGNOSIS — K219 Gastro-esophageal reflux disease without esophagitis: Secondary | ICD-10-CM | POA: Diagnosis not present

## 2020-11-30 DIAGNOSIS — E038 Other specified hypothyroidism: Secondary | ICD-10-CM | POA: Diagnosis not present

## 2020-11-30 DIAGNOSIS — I10 Essential (primary) hypertension: Secondary | ICD-10-CM | POA: Diagnosis not present

## 2020-11-30 DIAGNOSIS — R52 Pain, unspecified: Secondary | ICD-10-CM | POA: Diagnosis not present

## 2020-11-30 DIAGNOSIS — M6281 Muscle weakness (generalized): Secondary | ICD-10-CM | POA: Diagnosis not present

## 2020-11-30 DIAGNOSIS — R2681 Unsteadiness on feet: Secondary | ICD-10-CM | POA: Diagnosis not present

## 2020-12-01 ENCOUNTER — Telehealth: Payer: Self-pay

## 2020-12-01 DIAGNOSIS — J449 Chronic obstructive pulmonary disease, unspecified: Secondary | ICD-10-CM | POA: Diagnosis not present

## 2020-12-01 DIAGNOSIS — M6281 Muscle weakness (generalized): Secondary | ICD-10-CM | POA: Diagnosis not present

## 2020-12-01 DIAGNOSIS — E038 Other specified hypothyroidism: Secondary | ICD-10-CM | POA: Diagnosis not present

## 2020-12-01 DIAGNOSIS — K219 Gastro-esophageal reflux disease without esophagitis: Secondary | ICD-10-CM | POA: Diagnosis not present

## 2020-12-01 DIAGNOSIS — R52 Pain, unspecified: Secondary | ICD-10-CM | POA: Diagnosis not present

## 2020-12-01 DIAGNOSIS — R2681 Unsteadiness on feet: Secondary | ICD-10-CM | POA: Diagnosis not present

## 2020-12-01 DIAGNOSIS — I4891 Unspecified atrial fibrillation: Secondary | ICD-10-CM | POA: Diagnosis not present

## 2020-12-01 DIAGNOSIS — I10 Essential (primary) hypertension: Secondary | ICD-10-CM | POA: Diagnosis not present

## 2020-12-01 NOTE — Telephone Encounter (Signed)
Copied from [place] [id number]. Topic: General - Other >> Dec 01, 2020  3:50 PM Rainey Pines A wrote: Patients son called to let us know that the order for [facility] supply needs to be cancelled due to family members giving patient the equipment that she needed. Please advise

## 2020-12-02 DIAGNOSIS — K219 Gastro-esophageal reflux disease without esophagitis: Secondary | ICD-10-CM | POA: Diagnosis not present

## 2020-12-02 DIAGNOSIS — M6281 Muscle weakness (generalized): Secondary | ICD-10-CM | POA: Diagnosis not present

## 2020-12-02 DIAGNOSIS — J449 Chronic obstructive pulmonary disease, unspecified: Secondary | ICD-10-CM | POA: Diagnosis not present

## 2020-12-02 DIAGNOSIS — I10 Essential (primary) hypertension: Secondary | ICD-10-CM | POA: Diagnosis not present

## 2020-12-02 DIAGNOSIS — I4891 Unspecified atrial fibrillation: Secondary | ICD-10-CM | POA: Diagnosis not present

## 2020-12-02 DIAGNOSIS — R52 Pain, unspecified: Secondary | ICD-10-CM | POA: Diagnosis not present

## 2020-12-02 DIAGNOSIS — E038 Other specified hypothyroidism: Secondary | ICD-10-CM | POA: Diagnosis not present

## 2020-12-02 DIAGNOSIS — R2681 Unsteadiness on feet: Secondary | ICD-10-CM | POA: Diagnosis not present

## 2020-12-02 NOTE — Telephone Encounter (Signed)
Orders cancelled per patient's sons request.

## 2020-12-04 DIAGNOSIS — J449 Chronic obstructive pulmonary disease, unspecified: Secondary | ICD-10-CM | POA: Diagnosis not present

## 2020-12-04 DIAGNOSIS — K219 Gastro-esophageal reflux disease without esophagitis: Secondary | ICD-10-CM | POA: Diagnosis not present

## 2020-12-04 DIAGNOSIS — I4891 Unspecified atrial fibrillation: Secondary | ICD-10-CM | POA: Diagnosis not present

## 2020-12-04 DIAGNOSIS — I10 Essential (primary) hypertension: Secondary | ICD-10-CM | POA: Diagnosis not present

## 2020-12-04 DIAGNOSIS — M6281 Muscle weakness (generalized): Secondary | ICD-10-CM | POA: Diagnosis not present

## 2020-12-04 DIAGNOSIS — R52 Pain, unspecified: Secondary | ICD-10-CM | POA: Diagnosis not present

## 2020-12-04 DIAGNOSIS — E038 Other specified hypothyroidism: Secondary | ICD-10-CM | POA: Diagnosis not present

## 2020-12-04 DIAGNOSIS — R2681 Unsteadiness on feet: Secondary | ICD-10-CM | POA: Diagnosis not present

## 2020-12-06 ENCOUNTER — Encounter: Payer: Self-pay | Admitting: Family Medicine

## 2020-12-06 NOTE — Telephone Encounter (Signed)
Absolutely. Feel free to send.

## 2020-12-08 DIAGNOSIS — R109 Unspecified abdominal pain: Secondary | ICD-10-CM | POA: Diagnosis not present

## 2020-12-08 DIAGNOSIS — U071 COVID-19: Secondary | ICD-10-CM | POA: Diagnosis not present

## 2020-12-08 DIAGNOSIS — I10 Essential (primary) hypertension: Secondary | ICD-10-CM | POA: Diagnosis not present

## 2020-12-08 DIAGNOSIS — R69 Illness, unspecified: Secondary | ICD-10-CM | POA: Diagnosis not present

## 2020-12-08 DIAGNOSIS — I4891 Unspecified atrial fibrillation: Secondary | ICD-10-CM | POA: Diagnosis not present

## 2020-12-08 DIAGNOSIS — M6281 Muscle weakness (generalized): Secondary | ICD-10-CM | POA: Diagnosis not present

## 2020-12-09 ENCOUNTER — Encounter: Payer: Self-pay | Admitting: Family Medicine

## 2020-12-09 ENCOUNTER — Telehealth: Payer: Self-pay

## 2020-12-09 DIAGNOSIS — R5381 Other malaise: Secondary | ICD-10-CM

## 2020-12-09 DIAGNOSIS — E440 Moderate protein-calorie malnutrition: Secondary | ICD-10-CM

## 2020-12-09 DIAGNOSIS — N183 Chronic kidney disease, stage 3 unspecified: Secondary | ICD-10-CM

## 2020-12-09 DIAGNOSIS — E039 Hypothyroidism, unspecified: Secondary | ICD-10-CM

## 2020-12-09 DIAGNOSIS — I129 Hypertensive chronic kidney disease with stage 1 through stage 4 chronic kidney disease, or unspecified chronic kidney disease: Secondary | ICD-10-CM

## 2020-12-09 DIAGNOSIS — R413 Other amnesia: Secondary | ICD-10-CM

## 2020-12-09 NOTE — Telephone Encounter (Signed)
We can put in referral for home health when we have a discharge date- do we know when she is going to be discharged from [facility]?

## 2020-12-09 NOTE — Telephone Encounter (Signed)
Copied from [place] [phone]. Topic: General - Other >> Dec 09, 2020  3:13 PM Tessa Lerner A wrote: Reason for CRM: Patient's son would like to be contacted in regards to recommendations and referrals for [facility] to potentially provide care. Patient was at [facility] in [place], [place] a skilled nursing facility from 10/21/20 through 12/09/20 Patient's son is most concerned with pain management and wound care (patient has a sore on rear)

## 2020-12-09 NOTE — Telephone Encounter (Signed)
Note printed and faxed as requested.

## 2020-12-10 ENCOUNTER — Encounter: Payer: Self-pay | Admitting: Family Medicine

## 2020-12-10 ENCOUNTER — Ambulatory Visit: Payer: Self-pay | Admitting: *Deleted

## 2020-12-10 ENCOUNTER — Other Ambulatory Visit: Payer: Self-pay

## 2020-12-10 ENCOUNTER — Emergency Department: Payer: Medicare HMO

## 2020-12-10 ENCOUNTER — Encounter: Payer: Self-pay | Admitting: Emergency Medicine

## 2020-12-10 ENCOUNTER — Inpatient Hospital Stay
Admission: EM | Admit: 2020-12-10 | Discharge: 2020-12-20 | DRG: 177 | Disposition: A | Discharge status: Home Health | Payer: Medicare HMO | Attending: Internal Medicine | Admitting: Internal Medicine

## 2020-12-10 ENCOUNTER — Telehealth: Payer: Self-pay

## 2020-12-10 DIAGNOSIS — R4182 Altered mental status, unspecified: Secondary | ICD-10-CM

## 2020-12-10 DIAGNOSIS — N179 Acute kidney failure, unspecified: Secondary | ICD-10-CM | POA: Diagnosis not present

## 2020-12-10 DIAGNOSIS — J449 Chronic obstructive pulmonary disease, unspecified: Secondary | ICD-10-CM | POA: Diagnosis present

## 2020-12-10 DIAGNOSIS — M419 Scoliosis, unspecified: Secondary | ICD-10-CM | POA: Diagnosis not present

## 2020-12-10 DIAGNOSIS — Z79899 Other long term (current) drug therapy: Secondary | ICD-10-CM

## 2020-12-10 DIAGNOSIS — L8990 Pressure ulcer of unspecified site, unspecified stage: Secondary | ICD-10-CM | POA: Diagnosis present

## 2020-12-10 DIAGNOSIS — D6489 Other specified anemias: Secondary | ICD-10-CM | POA: Diagnosis present

## 2020-12-10 DIAGNOSIS — E039 Hypothyroidism, unspecified: Secondary | ICD-10-CM | POA: Diagnosis present

## 2020-12-10 DIAGNOSIS — U071 COVID-19: Secondary | ICD-10-CM | POA: Diagnosis not present

## 2020-12-10 DIAGNOSIS — Z853 Personal history of malignant neoplasm of breast: Secondary | ICD-10-CM

## 2020-12-10 DIAGNOSIS — J9811 Atelectasis: Secondary | ICD-10-CM | POA: Diagnosis not present

## 2020-12-10 DIAGNOSIS — K4090 Unilateral inguinal hernia, without obstruction or gangrene, not specified as recurrent: Secondary | ICD-10-CM | POA: Diagnosis not present

## 2020-12-10 DIAGNOSIS — E785 Hyperlipidemia, unspecified: Secondary | ICD-10-CM | POA: Diagnosis present

## 2020-12-10 DIAGNOSIS — N3000 Acute cystitis without hematuria: Secondary | ICD-10-CM

## 2020-12-10 DIAGNOSIS — Z681 Body mass index (BMI) 19 or less, adult: Secondary | ICD-10-CM

## 2020-12-10 DIAGNOSIS — Z9981 Dependence on supplemental oxygen: Secondary | ICD-10-CM

## 2020-12-10 DIAGNOSIS — F17210 Nicotine dependence, cigarettes, uncomplicated: Secondary | ICD-10-CM | POA: Diagnosis present

## 2020-12-10 DIAGNOSIS — R0902 Hypoxemia: Secondary | ICD-10-CM | POA: Diagnosis not present

## 2020-12-10 DIAGNOSIS — R64 Cachexia: Secondary | ICD-10-CM | POA: Diagnosis present

## 2020-12-10 DIAGNOSIS — Z66 Do not resuscitate: Secondary | ICD-10-CM | POA: Diagnosis present

## 2020-12-10 DIAGNOSIS — J9611 Chronic respiratory failure with hypoxia: Secondary | ICD-10-CM | POA: Diagnosis present

## 2020-12-10 DIAGNOSIS — R52 Pain, unspecified: Secondary | ICD-10-CM | POA: Diagnosis not present

## 2020-12-10 DIAGNOSIS — D649 Anemia, unspecified: Secondary | ICD-10-CM

## 2020-12-10 DIAGNOSIS — E43 Unspecified severe protein-calorie malnutrition: Secondary | ICD-10-CM | POA: Diagnosis present

## 2020-12-10 DIAGNOSIS — Z923 Personal history of irradiation: Secondary | ICD-10-CM

## 2020-12-10 DIAGNOSIS — Z905 Acquired absence of kidney: Secondary | ICD-10-CM

## 2020-12-10 DIAGNOSIS — I69354 Hemiplegia and hemiparesis following cerebral infarction affecting left non-dominant side: Secondary | ICD-10-CM

## 2020-12-10 DIAGNOSIS — Z79890 Hormone replacement therapy (postmenopausal): Secondary | ICD-10-CM

## 2020-12-10 DIAGNOSIS — N3289 Other specified disorders of bladder: Secondary | ICD-10-CM | POA: Diagnosis not present

## 2020-12-10 DIAGNOSIS — I700 Atherosclerosis of aorta: Secondary | ICD-10-CM | POA: Diagnosis not present

## 2020-12-10 DIAGNOSIS — I10 Essential (primary) hypertension: Secondary | ICD-10-CM | POA: Diagnosis not present

## 2020-12-10 DIAGNOSIS — N189 Chronic kidney disease, unspecified: Secondary | ICD-10-CM | POA: Diagnosis not present

## 2020-12-10 DIAGNOSIS — Z7951 Long term (current) use of inhaled steroids: Secondary | ICD-10-CM

## 2020-12-10 DIAGNOSIS — R918 Other nonspecific abnormal finding of lung field: Secondary | ICD-10-CM | POA: Diagnosis not present

## 2020-12-10 DIAGNOSIS — N1330 Unspecified hydronephrosis: Secondary | ICD-10-CM | POA: Diagnosis not present

## 2020-12-10 DIAGNOSIS — I480 Paroxysmal atrial fibrillation: Secondary | ICD-10-CM | POA: Diagnosis present

## 2020-12-10 DIAGNOSIS — R001 Bradycardia, unspecified: Secondary | ICD-10-CM | POA: Diagnosis not present

## 2020-12-10 DIAGNOSIS — Z743 Need for continuous supervision: Secondary | ICD-10-CM | POA: Diagnosis not present

## 2020-12-10 DIAGNOSIS — R300 Dysuria: Secondary | ICD-10-CM | POA: Diagnosis not present

## 2020-12-10 DIAGNOSIS — Z7901 Long term (current) use of anticoagulants: Secondary | ICD-10-CM

## 2020-12-10 DIAGNOSIS — E875 Hyperkalemia: Secondary | ICD-10-CM | POA: Diagnosis present

## 2020-12-10 DIAGNOSIS — N1831 Chronic kidney disease, stage 3a: Secondary | ICD-10-CM | POA: Diagnosis present

## 2020-12-10 DIAGNOSIS — N136 Pyonephrosis: Secondary | ICD-10-CM | POA: Diagnosis present

## 2020-12-10 DIAGNOSIS — E878 Other disorders of electrolyte and fluid balance, not elsewhere classified: Secondary | ICD-10-CM | POA: Diagnosis not present

## 2020-12-10 DIAGNOSIS — E870 Hyperosmolality and hypernatremia: Secondary | ICD-10-CM | POA: Diagnosis not present

## 2020-12-10 DIAGNOSIS — I129 Hypertensive chronic kidney disease with stage 1 through stage 4 chronic kidney disease, or unspecified chronic kidney disease: Secondary | ICD-10-CM | POA: Diagnosis present

## 2020-12-10 DIAGNOSIS — Z90710 Acquired absence of both cervix and uterus: Secondary | ICD-10-CM

## 2020-12-10 DIAGNOSIS — N3001 Acute cystitis with hematuria: Secondary | ICD-10-CM

## 2020-12-10 DIAGNOSIS — I4891 Unspecified atrial fibrillation: Secondary | ICD-10-CM | POA: Diagnosis present

## 2020-12-10 LAB — BASIC METABOLIC PANEL
Anion gap: 11 (ref 5–15)
BUN: 85 mg/dL — ABNORMAL HIGH (ref 8–23)
CO2: 20 mmol/L — ABNORMAL LOW (ref 22–32)
Calcium: 8.9 mg/dL (ref 8.9–10.3)
Chloride: 107 mmol/L (ref 98–111)
Creatinine, Ser: 1.96 mg/dL — ABNORMAL HIGH (ref 0.44–1.00)
GFR, Estimated: 25 mL/min — ABNORMAL LOW (ref >60)
Glucose, Bld: 103 mg/dL — ABNORMAL HIGH (ref 70–99)
Potassium: 4.4 mmol/L (ref 3.5–5.1)
Sodium: 138 mmol/L (ref 135–145)

## 2020-12-10 LAB — URINALYSIS, COMPLETE (UACMP) WITH MICROSCOPIC
Bacteria, UA: NONE SEEN
Bilirubin Urine: NEGATIVE
Glucose, UA: NEGATIVE mg/dL
Ketones, ur: 5 mg/dL — AB
Nitrite: NEGATIVE
Protein, ur: >=300 mg/dL — AB
RBC / HPF: >50 RBC/hpf — ABNORMAL HIGH (ref 0–5)
Specific Gravity, Urine: 1.016 (ref 1.005–1.030)
Squamous Epithelial / HPF: NONE SEEN (ref 0–5)
WBC, UA: >50 WBC/hpf — ABNORMAL HIGH (ref 0–5)
pH: 7 (ref 5.0–8.0)

## 2020-12-10 LAB — CBC
HCT: 35.2 % — ABNORMAL LOW (ref 36.0–46.0)
Hemoglobin: 11.1 g/dL — ABNORMAL LOW (ref 12.0–15.0)
MCH: 29.5 pg (ref 26.0–34.0)
MCHC: 31.5 g/dL (ref 30.0–36.0)
MCV: 93.6 fL (ref 80.0–100.0)
Platelets: 397 10*3/uL (ref 150–400)
RBC: 3.76 MIL/uL — ABNORMAL LOW (ref 3.87–5.11)
RDW: 16.8 % — ABNORMAL HIGH (ref 11.5–15.5)
WBC: 6.6 10*3/uL (ref 4.0–10.5)
nRBC: 0 % (ref 0.0–0.2)

## 2020-12-10 LAB — HEPATIC FUNCTION PANEL
ALT: 8 U/L (ref 0–44)
AST: 14 U/L — ABNORMAL LOW (ref 15–41)
Albumin: 2.5 g/dL — ABNORMAL LOW (ref 3.5–5.0)
Alkaline Phosphatase: 60 U/L (ref 38–126)
Bilirubin, Direct: 0.1 mg/dL (ref 0.0–0.2)
Indirect Bilirubin: 0.7 mg/dL (ref 0.3–0.9)
Total Bilirubin: 0.8 mg/dL (ref 0.3–1.2)
Total Protein: 6.3 g/dL — ABNORMAL LOW (ref 6.5–8.1)

## 2020-12-10 LAB — TROPONIN I (HIGH SENSITIVITY): Troponin I (High Sensitivity): 20 ng/L — ABNORMAL HIGH (ref <18)

## 2020-12-10 LAB — LIPASE, BLOOD: Lipase: 26 U/L (ref 11–51)

## 2020-12-10 MED ORDER — SODIUM CHLORIDE 0.9 % IV SOLN
1.0000 g | Freq: Once | INTRAVENOUS | Status: AC
  Administered 2020-12-10: 1 g via INTRAVENOUS
  Filled 2020-12-10: qty 10

## 2020-12-10 MED ORDER — ONDANSETRON HCL 4 MG/2ML IJ SOLN: 4.0000 mg | Freq: Four times a day (QID) | INTRAMUSCULAR | Status: DC | PRN

## 2020-12-10 MED ORDER — LACTATED RINGERS IV BOLUS
1000.0000 mL | Freq: Once | INTRAVENOUS | Status: AC
  Administered 2020-12-10: 1000 mL via INTRAVENOUS

## 2020-12-10 MED ORDER — LACTATED RINGERS IV SOLN: INTRAVENOUS | Status: AC

## 2020-12-10 MED ORDER — OXYCODONE-ACETAMINOPHEN 5-325 MG PO TABS
1.0000 | ORAL_TABLET | Freq: Once | ORAL | Status: AC
  Administered 2020-12-10: 1 via ORAL
  Filled 2020-12-10: qty 1

## 2020-12-10 MED ORDER — ACETAMINOPHEN 500 MG PO TABS
1000.0000 mg | ORAL_TABLET | Freq: Four times a day (QID) | ORAL | Status: DC | PRN
  Administered 2020-12-10: 1000 mg via ORAL
  Filled 2020-12-10 (×2): qty 2

## 2020-12-10 MED ORDER — LEVOTHYROXINE SODIUM 50 MCG PO TABS
150.0000 ug | ORAL_TABLET | Freq: Every day | ORAL | Status: DC
  Administered 2020-12-12 – 2020-12-20 (×9): 150 ug via ORAL
  Filled 2020-12-10: qty 1
  Filled 2020-12-10 (×5): qty 3
  Filled 2020-12-10: qty 6
  Filled 2020-12-10: qty 3
  Filled 2020-12-10: qty 1

## 2020-12-10 MED ORDER — ALBUTEROL SULFATE HFA 108 (90 BASE) MCG/ACT IN AERS
2.0000 | INHALATION_SPRAY | Freq: Four times a day (QID) | RESPIRATORY_TRACT | Status: DC | PRN
  Filled 2020-12-10: qty 6.7

## 2020-12-10 MED ORDER — FLUTICASONE FUROATE-VILANTEROL 200-25 MCG/INH IN AEPB
1.0000 | INHALATION_SPRAY | Freq: Every day | RESPIRATORY_TRACT | Status: DC
  Administered 2020-12-13 – 2020-12-20 (×8): 1 via RESPIRATORY_TRACT
  Filled 2020-12-10: qty 28

## 2020-12-10 MED ORDER — ACETAMINOPHEN 650 MG RE SUPP: 650.0000 mg | Freq: Four times a day (QID) | RECTAL | Status: DC | PRN

## 2020-12-10 MED ORDER — SODIUM CHLORIDE 0.9 % IV SOLN
1.0000 g | INTRAVENOUS | Status: AC
  Administered 2020-12-11 – 2020-12-19 (×9): 1 g via INTRAVENOUS
  Filled 2020-12-10: qty 10
  Filled 2020-12-10 (×2): qty 1
  Filled 2020-12-10 (×2): qty 10
  Filled 2020-12-10: qty 1
  Filled 2020-12-10: qty 10
  Filled 2020-12-10: qty 1
  Filled 2020-12-10 (×2): qty 10

## 2020-12-10 MED ORDER — ONDANSETRON HCL 4 MG PO TABS: 4.0000 mg | ORAL_TABLET | Freq: Four times a day (QID) | ORAL | Status: DC | PRN

## 2020-12-10 MED ORDER — APIXABAN 2.5 MG PO TABS
2.5000 mg | ORAL_TABLET | Freq: Two times a day (BID) | ORAL | Status: DC
  Administered 2020-12-11 – 2020-12-20 (×18): 2.5 mg via ORAL
  Filled 2020-12-10 (×20): qty 1

## 2020-12-10 MED ORDER — METOPROLOL TARTRATE 50 MG PO TABS
50.0000 mg | ORAL_TABLET | Freq: Two times a day (BID) | ORAL | Status: DC
  Administered 2020-12-11 – 2020-12-13 (×4): 50 mg via ORAL
  Filled 2020-12-10 (×4): qty 1

## 2020-12-10 NOTE — ED Provider Notes (Signed)
[facility] Emergency Department Provider Note   ____________________________________________   Event Date/Time   First MD Initiated Contact with Patient 12/10/20 1952     (approximate)  I have reviewed the triage vital signs and the nursing notes.   HISTORY  Chief Complaint Dysuria    HPI Cathy Solis is a 80 y.o. female with past medical history of hypertension, hypothyroidism, COPD on 2 L, atrial fibrillation on Eliquis, and stroke with left-sided weakness who presents to the ED for altered mental status.  History is limited due to patient's altered mental status and majority of history comes from patient's son.  He states that patient was released from [facility] last week and over the past 24 hours she has had a decline in her mental status.  She has been groaning in pain at times but cannot verbalize where she is hurting.  He denies any recent falls and does not believe patient has hit her head.  He does say that patient seems to be gripping her lower abdomen and he has noticed some blood in her urine.  She has not had any fevers, cough, chest pain, or shortness of breath.        Past Medical History:  Diagnosis Date  . Breast cancer [facility]) 2006   Left breast, s/p radiation  . 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 Hypertension   . Kidney problem    Undeveloped R kidney  . 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 Personal history of tobacco use, presenting hazards to health 03/18/2016  . Stroke [facility])    residual left sided weakness  . Toe infection    followed by Dr. Jens Som    Patient Active Problem List   Diagnosis Date Noted  . SBO (small bowel obstruction) ([place])   . Goals of care, counseling/discussion   . Palliative care by specialist   . Abdominal distention   . Duodenal obstruction   . Abdominal pain 10/06/2020  . Hematemesis with nausea   . Upper GI bleed   . UTI (urinary tract infection)  09/27/2020  . Acute pyelonephritis 09/27/2020  . Unintentional weight loss 09/27/2020  . Debility 09/27/2020  . Atrial fibrillation ([place])   . Severe protein-calorie malnutrition ([place]) 09/03/2020  . 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 Chronic kidney disease (CKD), stage III (moderate) ([place]) 08/20/2019  . RBBB 05/09/2019  . Pressure injury of skin 01/15/2019  . COPD (chronic obstructive pulmonary disease) ([place]) 01/14/2019  . Hypertension 05/04/2018  . Osteoporosis 11/02/2017  . Coronary atherosclerosis 05/23/2016  . Personal history of tobacco use, presenting hazards to health 03/18/2016  . Idiopathic scoliosis 11/28/2014  . Tobacco abuse 11/28/2014  . 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 Hydronephrosis 02/04/2014  . 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 Carotid stenosis 05/23/2013  . Pernicious anemia 06/26/2012  . Hyperlipidemia 12/29/2011  . 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 Hypothyroidism 08/04/2011    Past Surgical History:  Procedure Laterality Date  . ABDOMINAL HYSTERECTOMY    . BLADDER REPAIR    . BREAST LUMPECTOMY Left   . BREAST SURGERY     left  . CAROTID ENDARTERECTOMY  2008   left  . COLONOSCOPY  2008  . CYSTOSCOPY WITH STENT PLACEMENT Left 10/08/2020   Procedure: CYSTOSCOPY WITH STENT PLACEMENT;  Surgeon: Billey Co, MD;  Location: [facility] ORS;  Service: Urology;  Laterality: Left;  . EYE SURGERY Right 2013  cataract  . KIDNEY SURGERY  1949    Prior to Admission medications   Medication Sig Start Date End Date Taking? Authorizing Provider  acetaminophen (TYLENOL) 325 MG tablet Take 2 tablets (650 mg total) by mouth every 6 (six) hours as needed for mild pain (or Fever >/= 101). 10/05/20   Jennye Boroughs, MD  albuterol (VENTOLIN HFA) 108 (90 Base) MCG/ACT inhaler Inhale 2 puffs into the lungs every 6 (six) hours as needed for wheezing or shortness of breath. 07/21/20   Johnson, Megan P, DO  apixaban  (ELIQUIS) 2.5 MG TABS tablet Take 1 tablet (2.5 mg total) by mouth 2 (two) times daily. 10/05/20   Jennye Boroughs, MD  fluticasone furoate-vilanterol (BREO ELLIPTA) 200-25 MCG/INH AEPB Inhale 1 puff into the lungs daily. 10/06/20   Jennye Boroughs, MD  ipratropium-albuterol (DUONEB) 0.5-2.5 (3) MG/3ML SOLN Take 3 mLs by nebulization 2 (two) times daily for 4 days. DuoNeb nebulizer treatments twice a day for 4 days followed by as needed for severe shortness of breath 01/27/20 09/27/20  Park Liter P, DO  levothyroxine (SYNTHROID) 150 MCG tablet Take 1 tablet (150 mcg total) by mouth daily. 09/06/20   Johnson, Megan P, DO  lisinopril (ZESTRIL) 10 MG tablet Take 1 tablet (10 mg total) by mouth daily. TAKE 1 TABLET BY MOUTH EVERY DAY 07/21/20   Park Liter P, DO  magnesium oxide (MAG-OX) 400 (241.3 Mg) MG tablet Take 1 tablet (400 mg total) by mouth 2 (two) times daily. 10/21/20   Pokhrel, Corrie Mckusick, MD  metoprolol tartrate (LOPRESSOR) 50 MG tablet Take 1 tablet (50 mg total) by mouth 2 (two) times daily. 10/05/20   Jennye Boroughs, MD  nicotine (NICODERM CQ - DOSED IN MG/24 HOURS) 21 mg/24hr patch Place 1 patch (21 mg total) onto the skin daily. 10/06/20   Jennye Boroughs, MD  ondansetron (ZOFRAN) 4 MG tablet Take 1 tablet (4 mg total) by mouth every 8 (eight) hours as needed for nausea or vomiting. 10/21/20   Pokhrel, Corrie Mckusick, MD  pantoprazole (PROTONIX) 40 MG tablet Take 1 tablet (40 mg total) by mouth daily. 10/21/20 11/20/20  Pokhrel, Laxman, MD  phenol (CHLORASEPTIC) 1.4 % LIQD Use as directed 1 spray in the mouth or throat as needed for throat irritation / pain. 10/21/20   Pokhrel, Corrie Mckusick, MD  polyethylene glycol (MIRALAX / GLYCOLAX) 17 g packet Take 17 g by mouth daily as needed for moderate constipation. 10/21/20   Pokhrel, Corrie Mckusick, MD  sucralfate (CARAFATE) 1 GM/10ML suspension Take 10 mLs (1 g total) by mouth 4 (four) times daily -  with meals and at bedtime for 7 days. 10/21/20 10/28/20  Pokhrel, Corrie Mckusick, MD     Allergies Statins and Zetia [ezetimibe]  Family History  Problem Relation Age of Onset  . Stroke Mother   . Heart disease Father   . Heart attack Maternal Grandfather     Social History Social History   Tobacco Use  . Smoking status: Current Some Day Smoker    Packs/day: 0.50    Years: 30.00    Pack years: 15.00    Types: Cigarettes    Last attempt to quit: 01/08/2019    Years since quitting: 1.9  . Smokeless tobacco: Never Used  Vaping Use  . Vaping Use: Never used  Substance Use Topics  . Alcohol use: No  . Drug use: No    Review of Systems Unable to obtain secondary to altered mental status  ____________________________________________   PHYSICAL EXAM:  VITAL SIGNS: ED Triage Vitals [  12/10/20 1153]  Enc Vitals Group     BP 124/72     Pulse Rate 77     Resp 18     Temp 97.7 F (36.5 C)     Temp Source Oral     SpO2 98 %     Weight 95 lb (43.1 kg)     Height 5\' 8"  (1.727 m)     Head Circumference      [facility]      Pain Score 9     Pain Loc      Pain Edu?      Excl. in [place]?     Constitutional: Awake and alert, groaning in pain. Eyes: Conjunctivae are normal. Head: Atraumatic. Nose: No congestion/rhinnorhea. Mouth/Throat: Mucous membranes are very dry. Neck: Normal ROM Cardiovascular: Normal rate, regular rhythm. Grossly normal heart sounds.  2+ radial pulses bilaterally. Respiratory: Normal respiratory effort.  No retractions. Lungs CTAB. Gastrointestinal: Soft and tender to palpation in suprapubic area. No distention. Genitourinary: deferred Musculoskeletal: No lower extremity tenderness nor edema. Neurologic: Opens eyes to voice, groaning verbal response. No gross focal neurologic deficits are appreciated, moving all extremities equally. Skin:  Skin is warm, dry and intact. No rash noted. Psychiatric: Mood and affect are normal. Speech and behavior are normal.  ____________________________________________   LABS (all labs ordered are  listed, but only abnormal results are displayed)  Labs Reviewed  BASIC METABOLIC PANEL - Abnormal; Notable for the following components:      Result Value   CO2 20 (*)    Glucose, Bld 103 (*)    BUN 85 (*)    Creatinine, Ser 1.96 (*)    GFR, Estimated 25 (*)    All other components within normal limits  CBC - Abnormal; Notable for the following components:   RBC 3.76 (*)    Hemoglobin 11.1 (*)    HCT 35.2 (*)    RDW 16.8 (*)    All other components within normal limits  HEPATIC FUNCTION PANEL - Abnormal; Notable for the following components:   Total Protein 6.3 (*)    Albumin 2.5 (*)    AST 14 (*)    All other components within normal limits  SARS CORONAVIRUS 2 (TAT 6-24 HRS)  LIPASE, BLOOD  URINALYSIS, COMPLETE (UACMP) WITH MICROSCOPIC  TROPONIN I (HIGH SENSITIVITY)   ____________________________________________  EKG  ED ECG REPORT I, Blake Divine, the attending physician, personally viewed and interpreted this ECG.   Date: 12/10/2020  EKG Time: 11:57  Rate: 80  Rhythm: normal sinus rhythm  Axis: Normal  Intervals:none  ST&T Change: None   PROCEDURES  Procedure(s) performed (including Critical Care):  Procedures   ____________________________________________   INITIAL IMPRESSION / ASSESSMENT AND PLAN / ED COURSE       80 year old female with past medical history of hypertension, COPD on 2 L, atrial fibrillation on Eliquis, and hypothyroidism who presents to the ED for worsening mental status over the past 24 hours with hematuria noted by her son.  Patient is awake and groaning in pain, opens eyes to voice and does not appear to have any focal neurologic deficits.  Given her change in mental status we will further assess with CT head.  I have a high suspicion for UTI and we will attempt to obtain catheterized urine sample, also check CT abdomen/pelvis to ensure no urinary obstruction or other acute process.  Labs thus far show AKI with markedly elevated BUN  consistent with severe dehydration.  CT head is  negative for acute process, CT abdomen/pelvis shows left ureteral stent appropriately positioned with chronic hydronephrosis currently unchanged.  Catheterized urine sample was obtained and results pending, anticipate admission for UTI and altered mental status.  Patient turned over to oncoming provider pending additional results.      ____________________________________________   FINAL CLINICAL IMPRESSION(S) / ED DIAGNOSES  Final diagnoses:  Altered mental status, unspecified altered mental status type     ED Discharge Orders    None       Note:  This document was prepared using Dragon voice recognition software and may include unintentional dictation errors.   Blake Divine, MD 12/10/20 2154

## 2020-12-10 NOTE — Telephone Encounter (Signed)
Son Jenevieve Kirschbaum called in.   He thinks his mom has a UTI.   She was in the hospital In Nov. For a UTI for a week.    She has been in a rehab facility since being in the hospital.   He doesn't know how long she's had these symptoms.  He brought her home yesterday  due to poor care at the facility. She was at [facility] in [place], [place].  He attempted to get her temperature but she would not cooperate while on the phone with me.    She has cognitive deficits.    He then told me she had blood in her brief.   A family member in the back ground said it was bright red blood.   Based on that along with her other symptoms I let son [name] know she needed to go to the ED which he was agreeable to doing.  After a long discussion regarding options other than taking her to [facility] at their request it was decided they are going to have her transported to the ED at [facility].  See notes below also.  I sent my notes to Dr. Park Liter at [facility] for her information.         Reason for Disposition . Bad or foul-smelling urine  Answer Assessment - Initial Assessment Questions 1. SYMPTOM: "What's the main symptom you're concerned about?" (e.g., frequency, incontinence)     Son Elliona Doddridge calling in.  Mom not cognitive to answer questions. She's c/o pain and burning with urination.   Last night c/o pain in her privates and abd pain. Son got Azos and that helped some with the burning.  It is helping.   I also gave her some 500 mg Tylenol. I saw blood in her brief.   She's bed bound.   She has been in [facility] yesterday so brought her home yesterday.    Not getting good care there. 2. ONSET: "When did the    start?"     I don't when all this started.    She was in [facility].    She doesn't communicate  Well.   She thinks the thermometer is a straw and she is combative with it.     3. PAIN: "Is there any pain?" If Yes, ask: "How bad is  it?" (Scale: 1-10; mild, moderate, severe)     Yes abd and burning. 4. CAUSE: "What do you think is causing the symptoms?"     UTI 5. OTHER SYMPTOMS: "Do you have any other symptoms?" (e.g., fever, flank pain, blood in urine, pain with urination)     Slight blood in her brief, burning, abd pain. She's been in hospital in Nov. With UTI.  In [place] went to [facility] for rehab.   She was independent prior to being in the hospital.  Son got her home.   Her mom had covid from being in the rehab.   It snowed so I was finally able to get her home recently.   She yells help when something is wrong.   It takes a while to figure out what she needs.    6. PREGNANCY: "Is there any chance you are pregnant?" "When was your last menstrual period?"     *No Answer*  Protocols used: URINARY [facility]

## 2020-12-10 NOTE — Telephone Encounter (Signed)
Called Cathy Solis, pt's son to schedule an appt per dr Wynetta Emery, no answer left vm

## 2020-12-10 NOTE — H&P (Signed)
History and Physical    Cathy Solis OAC:[id number] DOB: [birth year] DOA: 12/10/2020  PCP: Valerie Roys, DO  Patient coming from: Home via EMS  I have personally briefly reviewed patient's old medical records in [place]  Chief Complaint: Weakness, change in mentation  HPI: Cathy Solis is a 80 y.o. female with medical history significant for COPD, chronic respiratory failure with hypoxia on 2 L of O2 via [place], A. 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  History limited from patient due to poor cooperation and is otherwise obtained from EP and chart review.  Patient was last hospitalized 10/06/2020-10/21/2020 for abdominal pain thought multifactorial from duodenal compression versus SBO due to hernia, PUD, stool impaction, and/or esophagitis.  Patient seen by GI and general surgery during that hospitalization and not felt to have clinical signs of bowel obstruction.  [facility] ultimately [facility].  Per documentation, patient was brought to son's home yesterday.  There is concern for diminished mental status, functional decline, and possible UTI.  Patient intermittently groaning in pain but not verbalizing site of pain.  When asked where her pain is, she says "I don't know."  ED Course:  Initial vitals showed BP 124/72, pulse 77, RR 18, temp 97.7 F, SPO2 98% on room air.  Labs notable for WBC 6.6, hemoglobin 11.1, platelets 397,000, sodium 138, potassium 4.4, bicarb 20, BUN 85, creatinine 1.96 (previously 1.12 on 10/21/2020) serum glucose 103, lipase 26, high-sensitivity troponin I 20.  Urinalysis shows 5 ketones >= 300 protein, negative nitrites, trace leukocytes, >50 RBCs and WBCs/hpf, no bacteria on microscopy.  Urine  culture is obtained and pending.  SARS-CoV-2 PCR is obtained and pending.  CT head without contrast is negative for acute intracranial abnormality.  Findings consistent with age-related atrophy and chronic small vessel ischemia noted.  CT renal stone study shows mild right basilar atelectasis.  Stable marked severity left-sided hydronephrosis with a left-sided Endo ureteral stent is seen in place.  Asymmetric moderate to marked severity urinary bladder wall thickening also seen.  Portable chest x-ray shows linear atelectatic changes in the right base similar to prior CT imaging.  No focal consolidation, edema, or effusion seen.  Patient was given 1 L LR, Percocet, and IV ceftriaxone.  The hospitalist service was consulted to admit for further evaluation and management.  Review of Systems:  Unable to obtain full review of systems due to poor cooperation.   Past Medical History:  Diagnosis Date  . Breast cancer [facility]) 2006   Left breast, s/p radiation  . 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 Hypertension   . Kidney problem    Undeveloped R kidney  . 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 Personal history of tobacco use, presenting hazards to health 03/18/2016  . Stroke [facility])    residual left sided weakness  . Toe infection    followed by Dr. Jens Som    Past Surgical History:  Procedure Laterality Date  . ABDOMINAL HYSTERECTOMY    . BLADDER REPAIR    . BREAST LUMPECTOMY Left   . BREAST SURGERY     left  . CAROTID ENDARTERECTOMY  2008   left  . COLONOSCOPY  2008  . CYSTOSCOPY WITH STENT PLACEMENT Left 10/08/2020   Procedure:  CYSTOSCOPY WITH STENT PLACEMENT;  Surgeon: Billey Co, MD;  Location: [facility] ORS;  Service: Urology;  Laterality: Left;  . EYE SURGERY Right 2013   cataract  . KIDNEY SURGERY  1949    Social History:  reports that she has been smoking cigarettes. She has a 15.00 pack-year smoking history. She has never used  smokeless tobacco. She reports that she does not drink alcohol and does not use drugs.  Allergies  Allergen Reactions  . Statins Other (See Comments)    Myalgias  . Zetia [Ezetimibe] Other (See Comments)    myalgias    Family History  Problem Relation Age of Onset  . Stroke Mother   . Heart disease Father   . Heart attack Maternal Grandfather      Prior to Admission medications   Medication Sig Start Date End Date Taking? Authorizing Provider  acetaminophen (TYLENOL) 325 MG tablet Take 2 tablets (650 mg total) by mouth every 6 (six) hours as needed for mild pain (or Fever >/= 101). 10/05/20   Jennye Boroughs, MD  albuterol (VENTOLIN HFA) 108 (90 Base) MCG/ACT inhaler Inhale 2 puffs into the lungs every 6 (six) hours as needed for wheezing or shortness of breath. 07/21/20   Johnson, Megan P, DO  apixaban (ELIQUIS) 2.5 MG TABS tablet Take 1 tablet (2.5 mg total) by mouth 2 (two) times daily. 10/05/20   Jennye Boroughs, MD  fluticasone furoate-vilanterol (BREO ELLIPTA) 200-25 MCG/INH AEPB Inhale 1 puff into the lungs daily. 10/06/20   Jennye Boroughs, MD  ipratropium-albuterol (DUONEB) 0.5-2.5 (3) MG/3ML SOLN Take 3 mLs by nebulization 2 (two) times daily for 4 days. DuoNeb nebulizer treatments twice a day for 4 days followed by as needed for severe shortness of breath 01/27/20 09/27/20  Park Liter P, DO  levothyroxine (SYNTHROID) 150 MCG tablet Take 1 tablet (150 mcg total) by mouth daily. 09/06/20   Johnson, Megan P, DO  lisinopril (ZESTRIL) 10 MG tablet Take 1 tablet (10 mg total) by mouth daily. TAKE 1 TABLET BY MOUTH EVERY DAY 07/21/20   Park Liter P, DO  magnesium oxide (MAG-OX) 400 (241.3 Mg) MG tablet Take 1 tablet (400 mg total) by mouth 2 (two) times daily. 10/21/20   Pokhrel, Corrie Mckusick, MD  metoprolol tartrate (LOPRESSOR) 50 MG tablet Take 1 tablet (50 mg total) by mouth 2 (two) times daily. 10/05/20   Jennye Boroughs, MD  nicotine (NICODERM CQ - DOSED IN MG/24 HOURS) 21 mg/24hr patch  Place 1 patch (21 mg total) onto the skin daily. 10/06/20   Jennye Boroughs, MD  ondansetron (ZOFRAN) 4 MG tablet Take 1 tablet (4 mg total) by mouth every 8 (eight) hours as needed for nausea or vomiting. 10/21/20   Pokhrel, Corrie Mckusick, MD  pantoprazole (PROTONIX) 40 MG tablet Take 1 tablet (40 mg total) by mouth daily. 10/21/20 11/20/20  Pokhrel, Laxman, MD  phenol (CHLORASEPTIC) 1.4 % LIQD Use as directed 1 spray in the mouth or throat as needed for throat irritation / pain. 10/21/20   Pokhrel, Corrie Mckusick, MD  polyethylene glycol (MIRALAX / GLYCOLAX) 17 g packet Take 17 g by mouth daily as needed for moderate constipation. 10/21/20   Pokhrel, Corrie Mckusick, MD  sucralfate (CARAFATE) 1 GM/10ML suspension Take 10 mLs (1 g total) by mouth 4 (four) times daily -  with meals and at bedtime for 7 days. 10/21/20 10/28/20  Flora Lipps, MD    Physical Exam: Vitals:   12/10/20 1153 12/10/20 1515 12/10/20 1834 12/10/20 2021  BP: 124/72 120/70 (!) 128/54  132/85  Pulse: 77 70 93 91  Resp: 18 16 16 18   Temp: 97.7 F (36.5 C)     TempSrc: Oral     SpO2: 98% 98% 97% 96%  Weight: 43.1 kg     Height: 5\' 8"  (1.727 m)     Exam limited due to cooperation. Constitutional: Chronically ill-appearing cachectic elderly woman, occasionally answers questions but otherwise not really cooperative.  Keeps pulling blankets over self and intermittently groans in pain. Eyes: PERRL, lids and conjunctivae normal ENMT: Mucous membranes are dry. Posterior pharynx clear of any exudate or lesions.poor dentition.  Neck: normal, supple, no masses. Respiratory: clear to auscultation anteriorly.  Normal respiratory effort. No accessory muscle use.  Cardiovascular: Irregularly irregular, no murmurs / rubs / gallops. No extremity edema.  Abdomen: no tenderness, no masses palpated. Bowel sounds positive.  Musculoskeletal: no clubbing / cyanosis. No joint deformity upper and lower extremities.  Skin: no rashes, lesions, ulcers. No  induration Neurologic: CN 2-12 grossly intact. Sensation intact, difficult to assess strength due to cooperation but seems intact. Psychiatric: Not really cooperative, she is alert to self and knows that she is in the hospital.  Says that she lives with her husband.  Intermittently groans in pain.  Labs on Admission: I have personally reviewed following labs and imaging studies  CBC: Recent Labs  Lab 12/10/20 1156  WBC 6.6  HGB 11.1*  HCT 35.2*  MCV 93.6  PLT 99991111   Basic Metabolic Panel: Recent Labs  Lab 12/10/20 1156  NA 138  K 4.4  CL 107  CO2 20*  GLUCOSE 103*  BUN 85*  CREATININE 1.96*  CALCIUM 8.9   GFR: Estimated Creatinine Clearance: 15.3 mL/min (A) (by C-G formula based on SCr of 1.96 mg/dL (H)). Liver Function Tests: Recent Labs  Lab 12/10/20 1156  AST 14*  ALT 8  ALKPHOS 60  BILITOT 0.8  PROT 6.3*  ALBUMIN 2.5*   Recent Labs  Lab 12/10/20 1156  LIPASE 26   No results for input(s): AMMONIA in the last 168 hours. Coagulation Profile: No results for input(s): INR, PROTIME in the last 168 hours. Cardiac Enzymes: No results for input(s): CKTOTAL, CKMB, CKMBINDEX, TROPONINI in the last 168 hours. BNP (last 3 results) No results for input(s): PROBNP in the last 8760 hours. HbA1C: No results for input(s): HGBA1C in the last 72 hours. CBG: No results for input(s): GLUCAP in the last 168 hours. Lipid Profile: No results for input(s): CHOL, HDL, LDLCALC, TRIG, CHOLHDL, LDLDIRECT in the last 72 hours. Thyroid Function Tests: No results for input(s): TSH, T4TOTAL, FREET4, T3FREE, THYROIDAB in the last 72 hours. Anemia Panel: No results for input(s): VITAMINB12, FOLATE, FERRITIN, TIBC, IRON, RETICCTPCT in the last 72 hours. Urine analysis:    Component Value Date/Time   COLORURINE YELLOW (A) 12/10/2020 2121   APPEARANCEUR TURBID (A) 12/10/2020 2121   APPEARANCEUR Cloudy (A) 01/04/2019 0928   LABSPEC 1.016 12/10/2020 2121   LABSPEC 1.010 01/03/2014  1104   PHURINE 7.0 12/10/2020 2121   GLUCOSEU NEGATIVE 12/10/2020 2121   GLUCOSEU Negative 01/03/2014 1104   HGBUR MODERATE (A) 12/10/2020 2121   BILIRUBINUR NEGATIVE 12/10/2020 2121   BILIRUBINUR Negative 01/04/2019 0928   BILIRUBINUR Negative 01/03/2014 1104   KETONESUR 5 (A) 12/10/2020 2121   PROTEINUR >=300 (A) 12/10/2020 2121   UROBILINOGEN 0.2 02/14/2013 1345   NITRITE NEGATIVE 12/10/2020 2121   LEUKOCYTESUR TRACE (A) 12/10/2020 2121   LEUKOCYTESUR Negative 01/03/2014 1104    Radiological Exams on Admission: DG Chest  1 View  Result Date: 12/10/2020 CLINICAL DATA:  Right basilar infiltrate on recent CT EXAM: CHEST  1 VIEW COMPARISON:  CT from earlier in the same day. FINDINGS: Cardiac shadow is within normal limits. Aortic calcifications are seen. Bilateral nipple shadows are noted. Mild linear atelectatic changes are noted in the right base similar to that seen on the prior CT examination. Lead is noted over the upper right chest wall. IMPRESSION: Linear atelectatic changes in the right base similar to that seen on prior CT examination. No other focal abnormality is noted. Electronically Signed   By: Inez Catalina M.D.   On: 12/10/2020 22:27   CT Head Wo Contrast  Result Date: 12/10/2020 CLINICAL DATA:  Mental status change EXAM: CT HEAD WITHOUT CONTRAST TECHNIQUE: Contiguous axial images were obtained from the base of the skull through the vertex without intravenous contrast. COMPARISON:  None. FINDINGS: Brain: No evidence of acute territorial infarction, hemorrhage, hydrocephalus,extra-axial collection or mass lesion/mass effect. There is dilatation the ventricles and sulci consistent with age-related atrophy. Low-attenuation changes in the deep white matter consistent with small vessel ischemia. Vascular: No hyperdense vessel or unexpected calcification. Skull: The skull is intact. No fracture or focal lesion identified. Sinuses/Orbits: The visualized paranasal sinuses and mastoid air  cells are clear. The orbits and globes intact. Other: None IMPRESSION: No acute intracranial abnormality. Findings consistent with age related atrophy and chronic small vessel ischemia Electronically Signed   By: Prudencio Pair M.D.   On: 12/10/2020 20:58   CT Renal Stone Study  Result Date: 12/10/2020 CLINICAL DATA:  Burning with urination. EXAM: CT ABDOMEN AND PELVIS WITHOUT CONTRAST TECHNIQUE: Multidetector CT imaging of the abdomen and pelvis was performed following the standard protocol without IV contrast. COMPARISON:  October 13, 2020 FINDINGS: Lower chest: Mild atelectasis and/or infiltrate is seen within the posterior aspect of the right lung base. Hepatobiliary: No focal liver abnormality is seen. No gallstones, gallbladder wall thickening, or biliary dilatation. The gallstone seen within the gallbladder lumen on the prior study is not visualized on the current exam. Pancreas: Unremarkable. No pancreatic ductal dilatation or surrounding inflammatory changes. Spleen: Normal in size without focal abnormality. Adrenals/Urinary Tract: Adrenal glands are unremarkable. The right kidney is absent. The left kidney is normal in size. A left-sided endo ureteral stent is seen and is unchanged in position. Stable marked severity left-sided hydronephrosis is also noted. Asymmetric moderate to marked severity lateral urinary bladder wall thickening is seen, right greater than left. Stomach/Bowel: Stomach is within normal limits. The appendix is not identified. No evidence of bowel wall thickening, distention, or inflammatory changes. Vascular/Lymphatic: Aortic atherosclerosis. No enlarged abdominal or pelvic lymph nodes. Reproductive: Status post hysterectomy. No adnexal masses. Other: No abdominal wall hernia or abnormality. The small right inguinal hernia is seen on the prior study is no longer present. No abdominopelvic ascites. Musculoskeletal: Multilevel degenerative changes seen throughout the lumbar spine.  Marked severity dextroscoliosis is also seen. IMPRESSION: 1. Mild right basilar atelectasis and/or infiltrate. 2. Stable marked severity left-sided hydronephrosis with a left-sided endo ureteral stent in place. 3. Asymmetric moderate to marked severity urinary bladder wall thickening which may represent sequelae associated with cystitis. Correlation with urinalysis is recommended to exclude the presence of an underlying neoplastic process. 4. Aortic atherosclerosis. Aortic Atherosclerosis (ICD10-I70.0). Electronically Signed   By: Virgina Norfolk M.D.   On: 12/10/2020 21:04    EKG: Personally reviewed. Atrial fibrillation, incomplete right bundle branch block, motion artifact.  Not significantly changed when compared to prior.  Assessment/Plan Principal Problem:   Acute kidney injury superimposed on CKD ([place]) Active Problems:   Hypothyroidism   Hypertension   COPD (chronic obstructive pulmonary disease) ([place])   Atrial fibrillation (HCC)  Cathy Solis is a 81 y.o. female with medical history significant for COPD, chronic respiratory failure with hypoxia on 2 L of O2 via [place], A. fib on Eliquis, history of CVA with residual left-sided weakness, chronic left UPJ obstruction/hydronephrosis s/p ureteral stent placement 10/08/2020, hypertension, and hypothyroidism who is admitted for AKI on CKD stage III.  AKI on CKD stage III Chronic left UPJ obstruction/hydronephrosis s/p ureteral stent placement 10/08/2020: Appears secondary to significant dehydration from poor oral intake.  CT renal stone study shows chronic left sided hydronephrosis seen with ureteral stent in place.  Urinary bladder wall thickening also noted. -Continue IV fluid hydration overnight -Continue empiric IV ceftriaxone for possible cystitis/UTI, follow urine culture -Hold lisinopril  Change in mentation/Deconditioning/functional decline: Per prior documentation, patient has had poor nutrition, functional debility, refusing some  care/meds since last hospitalization.  Palliative care note 11/11/2020 states patient dependent in all ADLs, IADLs.  Progressive functional decline concerning for poor prognosis.  Would benefit from further palliative care discussions. -Consult to PT/OT/dietitian  COPD/chronic respiratory failure with hypoxia: Previously reported to require 2 L supplemental 2 via [place] however saturating well on room air on admission.  No evidence of acute COPD exacerbation. -Continue Breo and as needed albuterol  Atrial fibrillation: Remains in atrial fibrillation on admission with controlled rate.  Continue Eliquis and metoprolol.  Hypertension: Currently stable.  Continue metoprolol.  Holding lisinopril due to AKI.  Hypothyroidism: Continue Synthroid.  Check TSH.  DVT prophylaxis: Eliquis Code Status: Full code per prior Family Communication: Attempted to call patient's son by phone without answer Disposition Plan: From home, anticipate patient will need placement Consults called: None Admission status:  Status is: Observation  The patient remains OBS appropriate and will d/c before 2 midnights.  Dispo: The patient is from: Home              Anticipated d/c is to: SNF              Anticipated d/c date is: 1 day              Patient currently is not medically stable to d/c.  Zada Finders MD [facility]  If 7PM-7AM, please contact night-coverage [email or web address]  12/10/2020, 10:33 PM

## 2020-12-10 NOTE — Telephone Encounter (Signed)
Called and left a message for patient's son to return my call with a discharge date.

## 2020-12-10 NOTE — ED Notes (Signed)
Patient incontinent urine.  Skin care given.  Linen changed.  Tolerated well.

## 2020-12-10 NOTE — ED Triage Notes (Signed)
Pt comes into the ED via [facility] from home c/o possible UTI.  Per patient's family she was released from [facility] a week ago and hasn't been acting herself.  Patient also complaining of burning with urination.  Pt has had increased weakness since returning home as well.  Pt currently has even and unlabored respiration and all vitals stable with EMS.

## 2020-12-11 ENCOUNTER — Telehealth: Payer: Self-pay

## 2020-12-11 DIAGNOSIS — N179 Acute kidney failure, unspecified: Secondary | ICD-10-CM | POA: Diagnosis not present

## 2020-12-11 DIAGNOSIS — R4182 Altered mental status, unspecified: Secondary | ICD-10-CM | POA: Diagnosis not present

## 2020-12-11 DIAGNOSIS — E875 Hyperkalemia: Secondary | ICD-10-CM | POA: Diagnosis not present

## 2020-12-11 DIAGNOSIS — R809 Proteinuria, unspecified: Secondary | ICD-10-CM | POA: Diagnosis not present

## 2020-12-11 DIAGNOSIS — N3000 Acute cystitis without hematuria: Secondary | ICD-10-CM | POA: Diagnosis not present

## 2020-12-11 DIAGNOSIS — N3001 Acute cystitis with hematuria: Secondary | ICD-10-CM | POA: Diagnosis not present

## 2020-12-11 DIAGNOSIS — J449 Chronic obstructive pulmonary disease, unspecified: Secondary | ICD-10-CM | POA: Diagnosis present

## 2020-12-11 DIAGNOSIS — D6489 Other specified anemias: Secondary | ICD-10-CM | POA: Diagnosis present

## 2020-12-11 DIAGNOSIS — I10 Essential (primary) hypertension: Secondary | ICD-10-CM | POA: Diagnosis not present

## 2020-12-11 DIAGNOSIS — Z79890 Hormone replacement therapy (postmenopausal): Secondary | ICD-10-CM | POA: Diagnosis not present

## 2020-12-11 DIAGNOSIS — U071 COVID-19: Secondary | ICD-10-CM | POA: Diagnosis not present

## 2020-12-11 DIAGNOSIS — F17210 Nicotine dependence, cigarettes, uncomplicated: Secondary | ICD-10-CM | POA: Diagnosis present

## 2020-12-11 DIAGNOSIS — Z66 Do not resuscitate: Secondary | ICD-10-CM | POA: Diagnosis present

## 2020-12-11 DIAGNOSIS — Z79899 Other long term (current) drug therapy: Secondary | ICD-10-CM | POA: Diagnosis not present

## 2020-12-11 DIAGNOSIS — Z853 Personal history of malignant neoplasm of breast: Secondary | ICD-10-CM | POA: Diagnosis not present

## 2020-12-11 DIAGNOSIS — Z923 Personal history of irradiation: Secondary | ICD-10-CM | POA: Diagnosis not present

## 2020-12-11 DIAGNOSIS — N1831 Chronic kidney disease, stage 3a: Secondary | ICD-10-CM | POA: Diagnosis not present

## 2020-12-11 DIAGNOSIS — N136 Pyonephrosis: Secondary | ICD-10-CM | POA: Diagnosis not present

## 2020-12-11 DIAGNOSIS — J410 Simple chronic bronchitis: Secondary | ICD-10-CM

## 2020-12-11 DIAGNOSIS — J9611 Chronic respiratory failure with hypoxia: Secondary | ICD-10-CM | POA: Diagnosis not present

## 2020-12-11 DIAGNOSIS — Z90710 Acquired absence of both cervix and uterus: Secondary | ICD-10-CM | POA: Diagnosis not present

## 2020-12-11 DIAGNOSIS — Z9981 Dependence on supplemental oxygen: Secondary | ICD-10-CM | POA: Diagnosis not present

## 2020-12-11 DIAGNOSIS — E43 Unspecified severe protein-calorie malnutrition: Secondary | ICD-10-CM | POA: Diagnosis not present

## 2020-12-11 DIAGNOSIS — I129 Hypertensive chronic kidney disease with stage 1 through stage 4 chronic kidney disease, or unspecified chronic kidney disease: Secondary | ICD-10-CM | POA: Diagnosis present

## 2020-12-11 DIAGNOSIS — R64 Cachexia: Secondary | ICD-10-CM | POA: Diagnosis present

## 2020-12-11 DIAGNOSIS — Z7189 Other specified counseling: Secondary | ICD-10-CM | POA: Diagnosis not present

## 2020-12-11 DIAGNOSIS — D649 Anemia, unspecified: Secondary | ICD-10-CM | POA: Diagnosis not present

## 2020-12-11 DIAGNOSIS — Z515 Encounter for palliative care: Secondary | ICD-10-CM | POA: Diagnosis not present

## 2020-12-11 DIAGNOSIS — Z7951 Long term (current) use of inhaled steroids: Secondary | ICD-10-CM | POA: Diagnosis not present

## 2020-12-11 DIAGNOSIS — N399 Disorder of urinary system, unspecified: Secondary | ICD-10-CM | POA: Diagnosis not present

## 2020-12-11 DIAGNOSIS — Z7901 Long term (current) use of anticoagulants: Secondary | ICD-10-CM | POA: Diagnosis not present

## 2020-12-11 DIAGNOSIS — I480 Paroxysmal atrial fibrillation: Secondary | ICD-10-CM | POA: Diagnosis not present

## 2020-12-11 DIAGNOSIS — N390 Urinary tract infection, site not specified: Secondary | ICD-10-CM | POA: Diagnosis not present

## 2020-12-11 DIAGNOSIS — I69354 Hemiplegia and hemiparesis following cerebral infarction affecting left non-dominant side: Secondary | ICD-10-CM | POA: Diagnosis not present

## 2020-12-11 DIAGNOSIS — Z681 Body mass index (BMI) 19 or less, adult: Secondary | ICD-10-CM | POA: Diagnosis not present

## 2020-12-11 DIAGNOSIS — E039 Hypothyroidism, unspecified: Secondary | ICD-10-CM | POA: Diagnosis not present

## 2020-12-11 DIAGNOSIS — E870 Hyperosmolality and hypernatremia: Secondary | ICD-10-CM | POA: Diagnosis not present

## 2020-12-11 LAB — C-REACTIVE PROTEIN: CRP: 7.8 mg/dL — ABNORMAL HIGH (ref <1.0)

## 2020-12-11 LAB — TSH: TSH: 11.746 u[IU]/mL — ABNORMAL HIGH (ref 0.350–4.500)

## 2020-12-11 LAB — T4, FREE: Free T4: 1.18 ng/dL — ABNORMAL HIGH (ref 0.61–1.12)

## 2020-12-11 LAB — CBC
HCT: 33.9 % — ABNORMAL LOW (ref 36.0–46.0)
Hemoglobin: 10.7 g/dL — ABNORMAL LOW (ref 12.0–15.0)
MCH: 29.4 pg (ref 26.0–34.0)
MCHC: 31.6 g/dL (ref 30.0–36.0)
MCV: 93.1 fL (ref 80.0–100.0)
Platelets: 383 10*3/uL (ref 150–400)
RBC: 3.64 MIL/uL — ABNORMAL LOW (ref 3.87–5.11)
RDW: 16.9 % — ABNORMAL HIGH (ref 11.5–15.5)
WBC: 6.4 10*3/uL (ref 4.0–10.5)
nRBC: 0 % (ref 0.0–0.2)

## 2020-12-11 LAB — BASIC METABOLIC PANEL
Anion gap: 11 (ref 5–15)
BUN: 87 mg/dL — ABNORMAL HIGH (ref 8–23)
CO2: 19 mmol/L — ABNORMAL LOW (ref 22–32)
Calcium: 8.9 mg/dL (ref 8.9–10.3)
Chloride: 108 mmol/L (ref 98–111)
Creatinine, Ser: 2.24 mg/dL — ABNORMAL HIGH (ref 0.44–1.00)
GFR, Estimated: 22 mL/min — ABNORMAL LOW (ref >60)
Glucose, Bld: 95 mg/dL (ref 70–99)
Potassium: 4.8 mmol/L (ref 3.5–5.1)
Sodium: 138 mmol/L (ref 135–145)

## 2020-12-11 LAB — MAGNESIUM
Magnesium: 2.2 mg/dL (ref 1.7–2.4)
Magnesium: 2.1 mg/dL (ref 1.7–2.4)

## 2020-12-11 LAB — VITAMIN B12: Vitamin B-12: 612 pg/mL (ref 180–914)

## 2020-12-11 LAB — FIBRIN DERIVATIVES D-DIMER (ARMC ONLY): Fibrin derivatives D-dimer (ARMC): 648.3 ng/mL (FEU) — ABNORMAL HIGH (ref 0.00–499.00)

## 2020-12-11 LAB — LACTATE DEHYDROGENASE: LDH: 123 U/L (ref 98–192)

## 2020-12-11 LAB — PHOSPHORUS
Phosphorus: 3.8 mg/dL (ref 2.5–4.6)
Phosphorus: 4.4 mg/dL (ref 2.5–4.6)

## 2020-12-11 LAB — SARS CORONAVIRUS 2 (TAT 6-24 HRS): SARS Coronavirus 2: POSITIVE — AB

## 2020-12-11 LAB — PROCALCITONIN: Procalcitonin: 0.53 ng/mL

## 2020-12-11 MED ORDER — DEXAMETHASONE SODIUM PHOSPHATE 10 MG/ML IJ SOLN
6.0000 mg | Freq: Every day | INTRAMUSCULAR | Status: DC
  Administered 2020-12-12 – 2020-12-19 (×8): 6 mg via INTRAVENOUS
  Filled 2020-12-11: qty 0.6
  Filled 2020-12-11 (×2): qty 1
  Filled 2020-12-11 (×5): qty 0.6

## 2020-12-11 MED ORDER — SODIUM CHLORIDE 0.9 % IV SOLN
200.0000 mg | Freq: Once | INTRAVENOUS | Status: AC
  Administered 2020-12-11: 15:00:00 200 mg via INTRAVENOUS
  Filled 2020-12-11: qty 40

## 2020-12-11 MED ORDER — ENSURE ENLIVE PO LIQD
237.0000 mL | Freq: Two times a day (BID) | ORAL | Status: DC
  Administered 2020-12-12 – 2020-12-20 (×13): 237 mL via ORAL

## 2020-12-11 MED ORDER — ADULT MULTIVITAMIN W/MINERALS CH
1.0000 | ORAL_TABLET | Freq: Every day | ORAL | Status: DC
  Administered 2020-12-12 – 2020-12-19 (×8): 1 via ORAL
  Filled 2020-12-11 (×9): qty 1

## 2020-12-11 MED ORDER — SODIUM CHLORIDE 0.9 % IV SOLN: INTRAVENOUS | Status: DC

## 2020-12-11 MED ORDER — HYDRALAZINE HCL 20 MG/ML IJ SOLN: 10.0000 mg | INTRAMUSCULAR | Status: DC | PRN

## 2020-12-11 MED ORDER — SODIUM CHLORIDE 0.9 % IV SOLN
100.0000 mg | Freq: Every day | INTRAVENOUS | Status: AC
  Administered 2020-12-12 – 2020-12-15 (×4): 100 mg via INTRAVENOUS
  Filled 2020-12-11 (×2): qty 100
  Filled 2020-12-11 (×2): qty 20

## 2020-12-11 MED ORDER — LABETALOL HCL 5 MG/ML IV SOLN: 10.0000 mg | INTRAVENOUS | Status: DC | PRN

## 2020-12-11 NOTE — Assessment & Plan Note (Signed)
-   Lisinopril on hold in setting of infection and worsened renal function - will treat with as needed medicines as needed

## 2020-12-11 NOTE — Assessment & Plan Note (Addendum)
-   spoke with son and further collateral information obtained as well as update given on 1/21. Patient tested positive on 11/16/20 outpatient (she has received both vaccines and booster); no treatment received when positive while at SNF - still positive on admission. While she is here for urinary symptoms, it is concerning that she is lethargy with borderline low O2 sats in the ER; she has underlying COPD on chronic 2L O2; other comorbidities include HTN, PAF, advanced age. UTI is also not fully conclusive as UA negative for bacteria and CT renal study shows stable hydronephrosis/stent in place and no leukocytosis on CBC. Therefore it is prudent to treat with decadron/remdesivir for now for at least 24 hours while treating UTI and monitoring clinical response. Can de-escalate covid treatment if this evolves to being urinary only in etiology, however patient has extremely high risk to decompensate rapidly if this is in part covid related and left untreated (especially as patient is full code at this time); [name] (son) does state that they wish to update her MOST form however to DNR and plan to do so when he comes to hospital  - start remdesivir and decadron - D-dimer 648, PCT 0.53, LDH 123, CRP in process - trend inflammatory markers

## 2020-12-11 NOTE — Progress Notes (Signed)
Nightly update given to son 

## 2020-12-11 NOTE — Evaluation (Addendum)
Physical Therapy Evaluation Patient Details Name: Cathy Solis MRN: [id number] DOB: 11-17-1941 Today's Date: 12/11/2020   History of Present Illness  Pt is a 80 y.o. female with medical history significant for COPD, chronic respiratory failure with hypoxia on 2 L of O2 via [place], A. 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  Clinical Impression  Pt seen for PT/OT co-tx. Pt received lying R sidelying in bed with B hips/knees and UE flexed. Pt found to be incontinent of BM & has additional incontinent BM & void during session. Pt does not participate in rolling L<>R to allow PT/OT to perform peri hygiene & changing brief total assist. Pt frequently yelling, and resistive to movement, unable to follow commands throughout session. PT contacted pt's son who reports pt was dependent PTA & he has DME & hired caregivers to assist pt. At this time PT will sign off as she's not appropriate for intervention at this time. Please re-consult if pt improves.     Follow Up Recommendations No PT follow up;Supervision/Assistance - 24 hour    Equipment Recommendations  None recommended by PT    Recommendations for Other Services       Precautions / Restrictions Precautions Precautions: Fall Restrictions Weight Bearing Restrictions: No      Mobility  Bed Mobility Overal bed mobility: Needs Assistance Bed Mobility: Rolling Rolling: Total assist;+2 for physical assistance         General bed mobility comments: pt does not participate in rolling L<>R    Transfers                    Ambulation/Gait                Stairs            Wheelchair Mobility    Modified Rankin (Stroke Patients Only)       Balance                                              Pertinent Vitals/Pain Pain Assessment: Faces Faces Pain Scale: Hurts whole lot Pain Location: "my head" Pain Descriptors / Indicators: Aching Pain Intervention(s): Monitored during session    Home Living Family/patient expects to be discharged to:: Private residence (plans to d/c home with son who was prepared to provide assistance & had hired caregivers prior to pt's readmission to hospital)   Available Help at Discharge: Family;Personal care attendant Type of Home: House         Home Equipment: Gilford Rile - 2 wheels;Hospital bed;Bedside commode;Wheelchair - manual Additional Comments: lift    Prior Function           Comments: Per son, pt was independent prior to initial hospital admission in November. Since that admission pt refused to participate in therapy in acute setting & at [facility]. Pt had just returned home from [facility] & was there less than 24 hrs before this admission to hospital. Pt's son [name]) reports he had hired caregivers for pt & they had extensive eqiupment to care for pt.     Hand Dominance        Extremity/Trunk Assessment   Upper Extremity Assessment Upper Extremity Assessment: Generalized weakness;Defer to  OT evaluation    Lower Extremity Assessment Lower Extremity Assessment: Generalized weakness (Pt lying with hips/knees flexed, knees together, PT attempts to place pillow between knees & pt with tight LE when PT places pillow. No active movement noted.)    Cervical / Trunk Assessment Cervical / Trunk Assessment: Kyphotic (son reports pt has hx of scoliosis)  Communication      Cognition Arousal/Alertness: Lethargic Behavior During Therapy: Restless Overall Cognitive Status: History of cognitive impairments - at baseline                                 General Comments: Pt does not appear oriented to situation, location & doesn't report her name/birthday. Pt with eyes closed throughout session,  occsaionally yelling out "help me! Let's stop this!"      General Comments      Exercises     Assessment/Plan    PT Assessment Patent does not need any further PT services  PT Problem List         PT Treatment Interventions      PT Goals (Current goals can be found in the Care Plan section)  Acute Rehab PT Goals Patient Stated Goal: none stated PT Goal Formulation: Patient unable to participate in goal setting Time For Goal Achievement: 12/25/20    Frequency     Barriers to discharge        Co-evaluation PT/OT/SLP Co-Evaluation/Treatment: Yes Reason for Co-Treatment: Complexity of the patient's impairments (multi-system involvement);Necessary to address cognition/behavior during functional activity;For patient/therapist safety PT goals addressed during session: Mobility/safety with mobility;Strengthening/ROM OT goals addressed during session: ADL's and self-care       AM-PAC PT "6 Clicks" Mobility  Outcome Measure Help needed turning from your back to your side while in a flat bed without using bedrails?: Total Help needed moving from lying on your back to sitting on the side of a flat bed without using bedrails?: Total Help needed moving to and from a bed to a chair (including a wheelchair)?: Total Help needed standing up from a chair using your arms (e.g., wheelchair or bedside chair)?: Total Help needed to walk in hospital room?: Total Help needed climbing 3-5 steps with a railing? : Total 6 Click Score: 6    End of Session   Activity Tolerance: Patient limited by lethargy Patient left: in bed;with call bell/phone within reach        Time: 1103-1128 PT Time Calculation (min) (ACUTE ONLY): 25 min   Charges:   PT Evaluation $PT Eval Moderate Complexity: 1 Mod PT Treatments $Therapeutic Activity: 8-22 mins        Lavone Nian, PT, DPT 12/11/20, 1:44 PM   Waunita Schooner 12/11/2020, 1:18 PM

## 2020-12-11 NOTE — Progress Notes (Signed)
Initial Nutrition Assessment  DOCUMENTATION CODES:   Severe malnutrition in context of chronic illness,Underweight  INTERVENTION:  Provide Ensure Enlive po BID, each supplement provides 350 kcal and 20 grams of protein.  Provide Magic cup TID with meals, each supplement provides 290 kcal and 9 grams of protein.  Provide MVI po daily.  Monitor magnesium, potassium, and phosphorus daily for at least 3 days, MD to replete as needed, as pt is at risk for refeeding syndrome given severe malnutrition.  NUTRITION DIAGNOSIS:   Severe Malnutrition related to chronic illness (COPD, CKD) as evidenced by severe fat depletion,severe muscle depletion.  GOAL:   Patient will meet greater than or equal to 90% of their needs  MONITOR:   PO intake,Supplement acceptance,Labs,Weight trends,I & O's  REASON FOR ASSESSMENT:   Consult Assessment of nutrition requirement/status  ASSESSMENT:   80 year old female with PMHx of HTN, COPD, hypothyroidism, CVA, breast cancer s/p lumpectomy and XRT admitted with COVID-19, acute renal failure on CKD, acute cystitis.   Met with patient at bedside. She was lying on her right side and sleeping. She did not wake to name call and was not able to provide any history. Patient known to this RD from recent admission where she had abdominal distention secondary to extrinsic duodenal compression. She ultimately needed TPN during that admission. Family members present during that admission had reported patient typically had good appetite and intake at baseline and would eat 2-3 meals per day. Patient now with increased nutrient needs related to COVID-19.  Patient's UBW was 113 lbs. Patient was 50.6 kg (111.32 lbs) on 10/18/2020. She is now documented to be 43.1 kg (95 lbs). That is a weight loss of 7.5 kg (14.8% body weight) over 2 months, which is significant for time frame.  Medications reviewed and include: Decadron 6 mg daily, levothyroxine, NS at 75 mL/hr,  ceftriaxone, remdesivir.  Labs reviewed: CO2 19, BUN 87, Creatinine 2.24.  NUTRITION - FOCUSED PHYSICAL EXAM:  Flowsheet Row Most Recent Value  Orbital Region Severe depletion  Upper Arm Region Severe depletion  Thoracic and Lumbar Region Severe depletion  Buccal Region Severe depletion  Temple Region Severe depletion  Clavicle Bone Region Severe depletion  Clavicle and Acromion Bone Region Severe depletion  Scapular Bone Region Severe depletion  Dorsal Hand Severe depletion  Patellar Region Severe depletion  Anterior Thigh Region Severe depletion  Posterior Calf Region Severe depletion  Edema (RD Assessment) None  Hair Reviewed  Eyes Unable to assess  Mouth Unable to assess  Skin Reviewed  Nails Reviewed     Diet Order:   Diet Order            Diet regular Room service appropriate? Yes; Fluid consistency: Thin  Diet effective now                EDUCATION NEEDS:   No education needs have been identified at this time  Skin:  Skin Assessment: Skin Integrity Issues: Skin Integrity Issues:: Stage I Stage I: sacrum  Last BM:  Unknown  Height:   Ht Readings from Last 1 Encounters:  12/10/20 5' 8" (1.727 m)   Weight:   Wt Readings from Last 1 Encounters:  12/10/20 43.1 kg   BMI:  Body mass index is 14.44 kg/m.  Estimated Nutritional Needs:   Kcal:  1300-1500  Protein:  65-75 grams  Fluid:  1.3-1.5 L/day  Jacklynn Barnacle, MS, RD, LDN Pager number available on Amion

## 2020-12-11 NOTE — Assessment & Plan Note (Signed)
-   On Synthroid 150 mcg at home - TSH 11.746 on admission which may be also reactive given acute illness.  Free T4 is actually very mildly elevated, 1.18 -She is also not taking in orals at this time given acute state.  If prolonged n.p.o. will convert to IV Synthroid otherwise the hope is that she will resume oral intake rather soon - Needs repeat TSH in 4 to 6 weeks, if free T4 still mildly elevated could consider decrease in Synthroid dosing - Follow-up total T3

## 2020-12-11 NOTE — TOC Progression Note (Signed)
Transition of Care [facility]) - Progression Note    Patient Details  Name: ONYINYECHI HUANTE MRN: [id number] Date of Birth: Dec 10, 1940  [facility] CM/SW Contact  Shelbie Hutching, RN Phone Number: 12/11/2020, 4:54 PM  Clinical Narrative:    Helene Kelp with [facility] has accepted referral for home health services for RN, PT, and OT.    Expected Discharge Plan: [place] Barriers to Discharge: Continued Medical Work up  Expected Discharge Plan and Services Expected Discharge Plan: [place]   Discharge Planning Services: CM Consult Post Acute Care Choice: [place] arrangements for the past 2 months: Single Family Home                 DME Arranged: N/A DME Agency: NA       HH Arranged: RN,PT,OT,Nurse's Aide [place]: [facility] [employer] [facility] [employer] Date [place]: 12/11/20 Time [place]: [place] Representative spoke with at [place]: [place] ([place]) Interventions    Readmission Risk Interventions Readmission Risk Prevention Plan 12/11/2020  Transportation Screening Complete  PCP or Specialist Appt within 3-5 Days Complete  HRI or [place] Complete  Social Work Consult for [place] Planning/Counseling Complete  Palliative Care Screening Not Complete  Palliative Care Screening Not Complete Comments could benefit from palliative consult  Medication Review Press photographer) Complete  Some recent data might be hidden

## 2020-12-11 NOTE — Progress Notes (Signed)
PROGRESS NOTE    Cathy Solis   [id number]  DOB: 12-25-1940  DOA: 12/10/2020     0  PCP: Valerie Roys, DO  CC: AMS, lethargy  Hospital Course: Ms. 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  She was unable to provide any collateral information on admission  Last hospitalization was 10/06/2020 until 10/21/2020 for abdominal pain which ultimately resolved on its own.  She had been seen by GI and general surgery during that hospitalization as well.  She was discharged to [facility]. On admission it is noted that COVID PCR was positive.  Her son also stated that she was positive for COVID on 11/16/2020 at [facility].  On work-up in the ER she was found to have UA concerning for possible infection and was started on Rocephin; urine culture was sent. At that time her PCR had not yet resulted.  CT head was unremarkable for acute findings.  Age-related atrophy and chronic small vessel ischemia noted. CT renal stone performed revealing stable but severe left-sided hydronephrosis with left sided Endo ureteral stent in place.  Urinary bladder wall thickening possibly due to cystitis.  And right basilar atelectasis/infiltrate.  CXR also was relatively clear except for the mild linear atelectatic changes appreciated in the right base that were seen on CT as well.  She was admitted for antibiotics, fluid resuscitation, and treatment for COVID-19 infection. She was started on Decadron and remdesivir.   Interval History:  Patient seen in the ER this morning.  She was curled up onto her right side moaning not wanting to be examined much or the blankets removed.  She could follow some commands but was unable to engage much in  conversation.  Called and spoke to her son, [name], this afternoon and updated him thoroughly with all questions answered.  We discussed plan for treating presumed UTI as well as possible contribution of COVID infection until otherwise proven especially given high risk of decompensation. He also plans to update/change her MOST form on his next visit to the hospital.  Old records reviewed in assessment of this patient  ROS: Review of systems not obtained due to patient factors.  Confusion and lethargy  Assessment & Plan: * Acute renal failure superimposed on stage 3a chronic kidney disease (HCC) - Baseline creatinine approximately 1.2-1.3.  Creatinine on admission was 1.96 and has further up trended - Continue fluids and monitor output - Etiology for now suspected prerenal due to patient feeling poorly recently and decreased oral intake  Acute cystitis - History of recent UTI in November 2021 as well as left UPJ obstruction requiring ureteral stent placement on 10/08/2020.  She also has associated left-sided hydronephrosis due to this - Urinalysis obtained on admission shows: Negative nitrite, trace LE, greater than 50 WBC, no bacteria.  She is however symptomatic with dysuria per her report from her son.  The patient is too altered to endorse any symptoms when seen in the ER - Follow-up pending cultures - Continue Rocephin -CT renal stone study performed on admission showed stable but ongoing severe left-sided hydronephrosis with ureteral stent in place with also urinary bladder wall thickening possibly related to acute cystitis  COVID-19 - spoke with son and further collateral information obtained as well as update given on  1/21. Patient tested positive on 11/16/20 outpatient (she has received both vaccines and booster); no treatment received when positive while at SNF - still positive on admission. While she is here for urinary symptoms, it is concerning that she is lethargy with borderline  low O2 sats in the ER; she has underlying COPD on chronic 2L O2; other comorbidities include HTN, PAF, advanced age. UTI is also not fully conclusive as UA negative for bacteria and CT renal study shows stable hydronephrosis/stent in place and no leukocytosis on CBC. Therefore it is prudent to treat with decadron/remdesivir for now for at least 24 hours while treating UTI and monitoring clinical response. Can de-escalate covid treatment if this evolves to being urinary only in etiology, however patient has extremely high risk to decompensate rapidly if this is in part covid related and left untreated (especially as patient is full code at this time); [name] (son) does state that they wish to update her MOST form however to DNR and plan to do so when he comes to hospital  - start remdesivir and decadron - D-dimer 648, PCT 0.53, LDH 123, CRP in process - trend inflammatory markers  COPD (chronic obstructive pulmonary disease) (HCC) - On 2 L nasal cannula oxygen chronically - She is at risk for worsening from a COVID perspective given underlying lung disease and her other comorbidities - Currently no signs/symptoms of exacerbation.  No significant wheezing appreciated on exam and CXR is relatively clear - Continue as needed albuterol inhaler -Continue Breo Ellipta - See COVID treatment as well  Atrial fibrillation (HCC) - Rate controlled and hemoglobin stable - Continue Eliquis - Continue Lopressor  Hypothyroidism - On Synthroid 150 mcg at home - TSH 11.746 on admission which may be also reactive given acute illness.  Free T4 is actually very mildly elevated, 1.18 -She is also not taking in orals at this time given acute state.  If prolonged n.p.o. will convert to IV Synthroid otherwise the hope is that she will resume oral intake rather soon - Needs repeat TSH in 4 to 6 weeks, if free T4 still mildly elevated could consider decrease in Synthroid dosing - Follow-up total T3  Normocytic anemia -  Baseline hemoglobin approximately 9 to 10 g/dL.  Currently at baseline - Hemoglobin 10.7 g/dL this morning  Hypertension - Lisinopril on hold in setting of infection and worsened renal function - will treat with as needed medicines as needed    Antimicrobials: Rocephin 1/20 >> current  DVT prophylaxis: Eliquis Code Status: Full Family Communication: Son, [name] Disposition Plan: Status is: Inpatient  Remains inpatient appropriate because:Altered mental status, Ongoing diagnostic testing needed not appropriate for outpatient work up, IV treatments appropriate due to intensity of illness or inability to take PO and Inpatient level of care appropriate due to severity of illness   Dispo: The patient is from: Home              Anticipated d/c is to: Home              Anticipated d/c date is: > 3 days              Patient currently is not medically stable to d/c.   Difficult to place patient No  Objective: Blood pressure (!) 136/116, pulse 91, temperature 98.2 F (36.8 C), resp. rate 18, height 5\' 8"  (1.727 m), weight 43.1 kg, SpO2 99 %.  Examination: General appearance: Extremely frail and cachectic elderly woman laying in bed appearing uncomfortable but no obvious  distress Head: Normocephalic, without obvious abnormality Eyes: EOMI Lungs: Relatively clear throughout with no obvious rhonchi or wheezing Heart: regular rate and rhythm and S1, S2 normal Abdomen: Thin, soft, nontender, nondistended, bowel sounds present Extremities: Severely cachectic extremities Skin: Excess skin hanging from bones Neurologic: Moves all 4 extremities, follows some commands  Consultants:     Procedures:     Data Reviewed: I have personally reviewed following labs and imaging studies Results for orders placed or performed during the hospital encounter of 12/10/20 (from the past 24 hour(s))  Urinalysis, Complete w Microscopic     Status: Abnormal   Collection Time: 12/10/20  9:21 PM  Result  Value Ref Range   Color, Urine YELLOW (A) YELLOW   APPearance TURBID (A) CLEAR   Specific Gravity, Urine 1.016 1.005 - 1.030   pH 7.0 5.0 - 8.0   Glucose, UA NEGATIVE NEGATIVE mg/dL   Hgb urine dipstick MODERATE (A) NEGATIVE   Bilirubin Urine NEGATIVE NEGATIVE   Ketones, ur 5 (A) NEGATIVE mg/dL   Protein, ur >=300 (A) NEGATIVE mg/dL   Nitrite NEGATIVE NEGATIVE   Leukocytes,Ua TRACE (A) NEGATIVE   RBC / HPF >50 (H) 0 - 5 RBC/hpf   WBC, UA >50 (H) 0 - 5 WBC/hpf   Bacteria, UA NONE SEEN NONE SEEN   Squamous Epithelial / LPF NONE SEEN 0 - 5   WBC Clumps PRESENT    Budding Yeast PRESENT    Amorphous Crystal PRESENT   SARS CORONAVIRUS 2 (TAT 6-24 HRS) Nasopharyngeal Nasopharyngeal Swab     Status: Abnormal   Collection Time: 12/10/20  9:21 PM   Specimen: Nasopharyngeal Swab  Result Value Ref Range   SARS Coronavirus 2 POSITIVE (A) NEGATIVE  Magnesium     Status: None   Collection Time: 12/11/20  4:49 AM  Result Value Ref Range   Magnesium 2.1 1.7 - 2.4 mg/dL  Phosphorus     Status: None   Collection Time: 12/11/20  4:49 AM  Result Value Ref Range   Phosphorus 4.4 2.5 - 4.6 mg/dL  CBC     Status: Abnormal   Collection Time: 12/11/20  4:49 AM  Result Value Ref Range   WBC 6.4 4.0 - 10.5 K/uL   RBC 3.64 (L) 3.87 - 5.11 MIL/uL   Hemoglobin 10.7 (L) 12.0 - 15.0 g/dL   HCT 33.9 (L) 36.0 - 46.0 %   MCV 93.1 80.0 - 100.0 fL   MCH 29.4 26.0 - 34.0 pg   MCHC 31.6 30.0 - 36.0 g/dL   RDW 16.9 (H) 11.5 - 15.5 %   Platelets 383 150 - 400 K/uL   nRBC 0.0 0.0 - 0.2 %  Basic metabolic panel     Status: Abnormal   Collection Time: 12/11/20  4:49 AM  Result Value Ref Range   Sodium 138 135 - 145 mmol/L   Potassium 4.8 3.5 - 5.1 mmol/L   Chloride 108 98 - 111 mmol/L   CO2 19 (L) 22 - 32 mmol/L   Glucose, Bld 95 70 - 99 mg/dL   BUN 87 (H) 8 - 23 mg/dL   Creatinine, Ser 2.24 (H) 0.44 - 1.00 mg/dL   Calcium 8.9 8.9 - 10.3 mg/dL   GFR, Estimated 22 (L) >60 mL/min   Anion gap 11 5 - 15   TSH     Status: Abnormal   Collection Time: 12/11/20  4:49 AM  Result Value Ref Range   TSH 11.746 (H) 0.350 - 4.500 uIU/mL  Vitamin B12  Status: None   Collection Time: 12/11/20  4:49 AM  Result Value Ref Range   Vitamin B-12 612 180 - 914 pg/mL  T4, free     Status: Abnormal   Collection Time: 12/11/20 11:58 AM  Result Value Ref Range   Free T4 1.18 (H) 0.61 - 1.12 ng/dL  Lactate dehydrogenase     Status: None   Collection Time: 12/11/20 11:58 AM  Result Value Ref Range   LDH 123 98 - 192 U/L  Procalcitonin - Baseline     Status: None   Collection Time: 12/11/20 11:58 AM  Result Value Ref Range   Procalcitonin 0.53 ng/mL  Fibrin derivatives D-Dimer ([facility] only)     Status: Abnormal   Collection Time: 12/11/20 11:58 AM  Result Value Ref Range   Fibrin derivatives D-dimer ([facility]) 648.30 (H) 0.00 - 499.00 ng/mL (FEU)    Recent Results (from the past 240 hour(s))  SARS CORONAVIRUS 2 (TAT 6-24 HRS) Nasopharyngeal Nasopharyngeal Swab     Status: Abnormal   Collection Time: 12/10/20  9:21 PM   Specimen: Nasopharyngeal Swab  Result Value Ref Range Status   SARS Coronavirus 2 POSITIVE (A) NEGATIVE Final    Comment: (NOTE) SARS-CoV-2 target nucleic acids are DETECTED.  The SARS-CoV-2 RNA is generally detectable in upper and lower respiratory specimens during the acute phase of infection. Positive results are indicative of the presence of SARS-CoV-2 RNA. Clinical correlation with patient history and other diagnostic information is  necessary to determine patient infection status. Positive results do not rule out bacterial infection or co-infection with other viruses.  The expected result is Negative.  Fact Sheet for Patients: SugarRoll.be  Fact Sheet for Healthcare Providers: https://www.woods-mathews.com/  This test is not yet approved or cleared by the [place] FDA and  has been authorized for detection and/or diagnosis of  SARS-CoV-2 by FDA under an Emergency Use Authorization (EUA). This EUA will remain  in effect (meaning this test can be used) for the duration of the COVID-19 declaration under Section 564(b)(1) of the Act, 21 U. S.C. section 360bbb-3(b)(1), unless the authorization is terminated or revoked sooner.   Performed at [facility], [place] [address] [place],  [address]      Radiology Studies: DG Chest 1 View  Result Date: 12/10/2020 CLINICAL DATA:  Right basilar infiltrate on recent CT EXAM: CHEST  1 VIEW COMPARISON:  CT from earlier in the same day. FINDINGS: Cardiac shadow is within normal limits. Aortic calcifications are seen. Bilateral nipple shadows are noted. Mild linear atelectatic changes are noted in the right base similar to that seen on the prior CT examination. Lead is noted over the upper right chest wall. IMPRESSION: Linear atelectatic changes in the right base similar to that seen on prior CT examination. No other focal abnormality is noted. Electronically Signed   By: Inez Catalina M.D.   On: 12/10/2020 22:27   CT Head Wo Contrast  Result Date: 12/10/2020 CLINICAL DATA:  Mental status change EXAM: CT HEAD WITHOUT CONTRAST TECHNIQUE: Contiguous axial images were obtained from the base of the skull through the vertex without intravenous contrast. COMPARISON:  None. FINDINGS: Brain: No evidence of acute territorial infarction, hemorrhage, hydrocephalus,extra-axial collection or mass lesion/mass effect. There is dilatation the ventricles and sulci consistent with age-related atrophy. Low-attenuation changes in the deep white matter consistent with small vessel ischemia. Vascular: No hyperdense vessel or unexpected calcification. Skull: The skull is intact. No fracture or focal lesion identified. Sinuses/Orbits: The visualized paranasal sinuses  and mastoid air cells are clear. The orbits and globes intact. Other: None IMPRESSION: No acute intracranial abnormality. Findings  consistent with age related atrophy and chronic small vessel ischemia Electronically Signed   By: Prudencio Pair M.D.   On: 12/10/2020 20:58   CT Renal Stone Study  Result Date: 12/10/2020 CLINICAL DATA:  Burning with urination. EXAM: CT ABDOMEN AND PELVIS WITHOUT CONTRAST TECHNIQUE: Multidetector CT imaging of the abdomen and pelvis was performed following the standard protocol without IV contrast. COMPARISON:  October 13, 2020 FINDINGS: Lower chest: Mild atelectasis and/or infiltrate is seen within the posterior aspect of the right lung base. Hepatobiliary: No focal liver abnormality is seen. No gallstones, gallbladder wall thickening, or biliary dilatation. The gallstone seen within the gallbladder lumen on the prior study is not visualized on the current exam. Pancreas: Unremarkable. No pancreatic ductal dilatation or surrounding inflammatory changes. Spleen: Normal in size without focal abnormality. Adrenals/Urinary Tract: Adrenal glands are unremarkable. The right kidney is absent. The left kidney is normal in size. A left-sided endo ureteral stent is seen and is unchanged in position. Stable marked severity left-sided hydronephrosis is also noted. Asymmetric moderate to marked severity lateral urinary bladder wall thickening is seen, right greater than left. Stomach/Bowel: Stomach is within normal limits. The appendix is not identified. No evidence of bowel wall thickening, distention, or inflammatory changes. Vascular/Lymphatic: Aortic atherosclerosis. No enlarged abdominal or pelvic lymph nodes. Reproductive: Status post hysterectomy. No adnexal masses. Other: No abdominal wall hernia or abnormality. The small right inguinal hernia is seen on the prior study is no longer present. No abdominopelvic ascites. Musculoskeletal: Multilevel degenerative changes seen throughout the lumbar spine. Marked severity dextroscoliosis is also seen. IMPRESSION: 1. Mild right basilar atelectasis and/or infiltrate. 2.  Stable marked severity left-sided hydronephrosis with a left-sided endo ureteral stent in place. 3. Asymmetric moderate to marked severity urinary bladder wall thickening which may represent sequelae associated with cystitis. Correlation with urinalysis is recommended to exclude the presence of an underlying neoplastic process. 4. Aortic atherosclerosis. Aortic Atherosclerosis (ICD10-I70.0). Electronically Signed   By: Virgina Norfolk M.D.   On: 12/10/2020 21:04   DG Chest 1 View  Final Result    CT Head Wo Contrast  Final Result    CT Renal Stone Study  Final Result      Scheduled Meds: . apixaban  2.5 mg Oral BID  . dexamethasone (DECADRON) injection  6 mg Intravenous Daily  . fluticasone furoate-vilanterol  1 puff Inhalation Daily  . levothyroxine  150 mcg Oral Daily  . metoprolol tartrate  50 mg Oral BID   PRN Meds: acetaminophen **OR** acetaminophen, albuterol, hydrALAZINE, labetalol, ondansetron **OR** ondansetron (ZOFRAN) IV Continuous Infusions: . sodium chloride    . cefTRIAXone (ROCEPHIN)  IV Stopped (12/11/20 1319)  . remdesivir 200 mg in sodium chloride 0.9% 250 mL IVPB     Followed by  . [START ON 12/12/2020] remdesivir 100 mg in NS 100 mL       LOS: 0 days  Time spent: Greater than 50% of the 35 minute visit was spent in counseling/coordination of care for the patient as laid out in the A&P.   Dwyane Dee, MD [facility] 12/11/2020, 3:02 PM

## 2020-12-11 NOTE — Assessment & Plan Note (Signed)
-   Baseline hemoglobin approximately 9 to 10 g/dL.  Currently at baseline - Hemoglobin 10.7 g/dL this morning

## 2020-12-11 NOTE — Assessment & Plan Note (Signed)
-   On 2 L nasal cannula oxygen chronically - She is at risk for worsening from a COVID perspective given underlying lung disease and her other comorbidities - Currently no signs/symptoms of exacerbation.  No significant wheezing appreciated on exam and CXR is relatively clear - Continue as needed albuterol inhaler -Continue Breo Ellipta - See COVID treatment as well

## 2020-12-11 NOTE — Evaluation (Signed)
Occupational Therapy Evaluation Patient Details Name: Cathy Solis MRN: [id number] DOB: Jan 03, 1941 Today's Date: 12/11/2020    History of Present Illness Pt is a 80 y.o. female with medical history significant for COPD, chronic respiratory failure with hypoxia on 2 L of O2 via [place], A. 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   Clinical Impression   [name] was seen for PT/OT co-evaluation. Upon arrival pt noted to be incontinent of bowel and required TOTAL A x2 perihygiene at bed level. Pt resistant to rolling L+R rolling, frequently yelling, hitting, and unable to follow commands throughout session. Per PT conversation with pt's son - pt was dependent PTA & he has DME & hired caregivers to assist pt. Will sign off at this time as pt is at baseline and is not appropriate for intervention. Please re-consult if pt improves.     Follow Up Recommendations  No OT follow up;Supervision/Assistance - 24 hour    Equipment Recommendations  None recommended by OT    Recommendations for Other Services       Precautions / Restrictions Precautions Precautions: Fall Restrictions Weight Bearing Restrictions: No      Mobility Bed Mobility Overal bed mobility: Needs Assistance Bed Mobility: Rolling Rolling: Total assist;+2 for physical assistance         General bed mobility comments: pt does not participate in rolling L<>R           ADL either performed or assessed with clinical judgement   ADL Overall ADL's : At baseline                                       General ADL Comments: TOTAL A x2 perihygiene at bed level                  Pertinent Vitals/Pain Pain Assessment: Faces Faces Pain Scale: Hurts whole lot Pain Location:  "my head" Pain Descriptors / Indicators: Aching Pain Intervention(s): Repositioned;Limited activity within patient's tolerance     Hand Dominance     Extremity/Trunk Assessment Upper Extremity Assessment Upper Extremity Assessment: Difficult to assess due to impaired cognition   Lower Extremity Assessment Lower Extremity Assessment: Difficult to assess due to impaired cognition   Cervical / Trunk Assessment Cervical / Trunk Assessment: Kyphotic (son reports pt has hx of scoliosis)   Communication     Cognition Arousal/Alertness: Lethargic Behavior During Therapy: Restless Overall Cognitive Status: No family/caregiver present to determine baseline cognitive functioning                                 General Comments: Pt does not appear oriented to situation, location & doesn't report her name/birthday. Pt with eyes closed throughout session, occsaionally yelling out "help me! Let's stop this!"   General Comments  redness noted on sacrum - RN notified sacral bad soiled and removed    Exercises Exercises: Other exercises Other Exercises Other Exercises: Toileting, L+R rolling   Shoulder Instructions      Home Living Family/patient expects to be discharged to:: Private residence   Available Help at Discharge: Family;Personal care attendant Type of Home: House Home Access: Level entry  Home Equipment: [place] - 2 wheels;Hospital bed;Bedside commode;Wheelchair - manual   Additional Comments: plans to d/c home with son who was prepared to provide assistance & had hired caregivers prior to pt's readmission to hospital. Lift      Prior Functioning/Environment Level of Independence: Independent with assistive device(s)        Comments: Per son, pt was independent prior to initial hospital admission in November. Since that admission pt refused to participate in therapy in acute setting & at [facility]. Pt had just returned home from [facility] &  was there less than 24 hrs before this admission to hospital. Pt's son Cathy Solis) reports he had hired caregivers for pt & they had extensive eqiupment to care for pt.        OT Problem List: Decreased strength;Decreased range of motion      OT Treatment/Interventions:      OT Goals(Current goals can be found in the care plan section) Acute Rehab OT Goals Patient Stated Goal: "let me go" OT Goal Formulation: With patient Time For Goal Achievement: 12/25/20 Potential to Achieve Goals: Poor             Co-evaluation PT/OT/SLP Co-Evaluation/Treatment: Yes Reason for Co-Treatment: Necessary to address cognition/behavior during functional activity;For patient/therapist safety;To address functional/ADL transfers PT goals addressed during session: Mobility/safety with mobility;Balance OT goals addressed during session: ADL's and self-care      AM-PAC OT "6 Clicks" Daily Activity     Outcome Measure Help from another person eating meals?: A Lot Help from another person taking care of personal grooming?: A Lot Help from another person toileting, which includes using toliet, bedpan, or urinal?: Total Help from another person bathing (including washing, rinsing, drying)?: Total Help from another person to put on and taking off regular upper body clothing?: A Lot Help from another person to put on and taking off regular lower body clothing?: Total 6 Click Score: 9   End of Session Nurse Communication: Mobility status  Activity Tolerance: Patient limited by pain;Treatment limited secondary to agitation Patient left: in bed;with call bell/phone within reach  OT Visit Diagnosis: Other abnormalities of gait and mobility (R26.89);Adult, failure to thrive (R62.7)                Time: 3662-9476 OT Time Calculation (min): 25 min Charges:  OT General Charges $OT Visit: 1 Visit OT Evaluation $OT Eval Low Complexity: 1 Low OT Treatments $Self Care/Home Management : 8-22 mins  Cathy Solis,  M.S. OTR/L  12/11/20, 1:54 PM  ascom [phone]

## 2020-12-11 NOTE — Assessment & Plan Note (Signed)
-   Rate controlled and hemoglobin stable - Continue Eliquis - Continue Lopressor

## 2020-12-11 NOTE — Assessment & Plan Note (Signed)
-   History of recent UTI in November 2021 as well as left UPJ obstruction requiring ureteral stent placement on 10/08/2020.  She also has associated left-sided hydronephrosis due to this - Urinalysis obtained on admission shows: Negative nitrite, trace LE, greater than 50 WBC, no bacteria.  She is however symptomatic with dysuria per her report from her son.  The patient is too altered to endorse any symptoms when seen in the ER - Follow-up pending cultures - Continue Rocephin -CT renal stone study performed on admission showed stable but ongoing severe left-sided hydronephrosis with ureteral stent in place with also urinary bladder wall thickening possibly related to acute cystitis

## 2020-12-11 NOTE — Assessment & Plan Note (Signed)
-   Baseline creatinine approximately 1.2-1.3.  Creatinine on admission was 1.96 and has further up trended - Continue fluids and monitor output - Etiology for now suspected prerenal due to patient feeling poorly recently and decreased oral intake

## 2020-12-11 NOTE — Hospital Course (Addendum)
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    Hospital admission 10/06/2020 until 10/21/2020 for abdominal pain which ultimately resolved on its own.  She had been seen by GI and general surgery during that hospitalization as well.  She was discharged to [facility].  On admission, COVID PCR positive.  Family reported pt had tested positive for COVID on 11/16/2020 at [facility].  Evaluation in the ED - UA concerning for possible infection and was started on Rocephin; urine culture was sent.  CT head was unremarkable for acute findings.  CT renal stone showed stable but severe left-sided hydronephrosis with left sided Endo ureteral stent in place.  Urinary bladder wall thickening possibly due to cystitis.  And right basilar atelectasis/infiltrate.  CXR also was relatively clear except for the mild linear atelectatic changes appreciated in the right base that were seen on CT as well.  Admitted for antibiotics, fluid resuscitation, and treatment for COVID-19 infection.  She was started on Decadron and remdesivir.

## 2020-12-11 NOTE — TOC Initial Note (Signed)
Transition of Care [facility]) - Initial/Assessment Note    Patient Details  Name: Cathy Solis MRN: [id number] Date of Birth: 07/03/1941  Transition [facility]) CM/SW Contact:    Shelbie Hutching, RN Phone Number: 12/11/2020, 4:32 PM  Clinical Narrative:                 Patient admitted to the hospital with potential UTI and COVID.  Family reports that patient tested positive for [facility] at [facility] on 12/27.  [name] the patient's son said that he went and took patient out of [facility] last week because she was not getting good care.  Patient was taken home with 24 hour care- son says he looked on [email or web address] and there is always family around.  Patient is pretty much total care at this point and [name] would like to see the patient alert and able to talk and have a conversation.  [name] would also like to see the patient be able to participate in her care, even if it is just to help turn in the bed.   Patient has all needed equipment at home, hospital bed, wheelchair, walker, bedside commode, and oxygen.  Goal is to get patient back home with home health services.   TOC will cont to follow.   Expected Discharge Plan: [place] Barriers to Discharge: Continued Medical Work up   Patient Goals and CMS Choice   CMS Medicare.gov Compare Post Acute Care list provided to:: Patient Represenative (must comment) Choice offered to / list presented to : Adult Children  Expected Discharge Plan and Services Expected Discharge Plan: [place]   Discharge Planning Services: CM Consult Post Acute Care Choice: [place] arrangements for the past 2 months: Single Family Home                 DME Arranged: N/A DME Agency: NA       HH Arranged: RN,PT,OT,Nurse's Aide          Prior Living Arrangements/Services Living arrangements for the past 2 months: Single Family Home   Patient language and need for interpreter reviewed:: Yes Do you feel safe going back to the  place where you live?: Yes      Need for Family Participation in Patient Care: Yes (Comment) Care giver support system in place?: Yes (comment) Current home services: DME,Homehealth aide (hospital bed, wheelchairs, walkers, bedside commode) Criminal Activity/Legal Involvement Pertinent to Current Situation/Hospitalization: No - Comment as needed  Activities of Daily Living Home Assistive Devices/Equipment: None ADL Screening (condition at time of admission) Patient's cognitive ability adequate to safely complete daily activities?: No Is the patient deaf or have difficulty hearing?: No Does the patient have difficulty seeing, even when wearing glasses/contacts?: Yes Does the patient have difficulty concentrating, remembering, or making decisions?: Yes Patient able to express need for assistance with ADLs?: No Does the patient have difficulty dressing or bathing?: Yes Independently performs ADLs?: No Communication: Dependent Is this a change from baseline?: Pre-admission baseline Dressing (OT): Dependent Is this a change from baseline?: Pre-admission baseline Grooming: Dependent Is this a change from baseline?: Pre-admission baseline Feeding: Dependent Is this a change from baseline?: Pre-admission baseline Bathing: Dependent Is this a change from baseline?: Pre-admission baseline Toileting: Dependent Is this a change from baseline?: Pre-admission baseline In/Out Bed: Dependent Is this a change from baseline?: Pre-admission baseline Walks in Home: Dependent Is this a change from baseline?: Pre-admission baseline Does the patient have difficulty walking or climbing  stairs?: Yes Weakness of Legs: Both Weakness of Arms/Hands: Both  Permission Sought/Granted Permission sought to share information with : Case Manager,Family Supports,Other (comment) Permission granted to share information with : Yes, Verbal Permission Granted  Share Information with NAME: [name]  Permission granted to  share info w AGENCY: Home Health agency  Permission granted to share info w Relationship: son     Emotional Assessment         Alcohol / Substance Use: Not Applicable Psych Involvement: No (comment)  Admission diagnosis:  Acute cystitis with hematuria [N30.01] AKI (acute kidney injury) ([place]) [N17.9] Altered mental status, unspecified altered mental status type [R41.82] COVID-19 [U07.1] Patient Active Problem List   Diagnosis Date Noted   COVID-19 12/11/2020   Acute cystitis 12/11/2020   Normocytic anemia 12/11/2020   Acute renal failure superimposed on stage 3a chronic kidney disease ([place]) 12/10/2020   SBO (small bowel obstruction) ([place])    Goals of care, counseling/discussion    Palliative care by specialist    Abdominal distention    Duodenal obstruction    Abdominal pain 10/06/2020   Hematemesis with nausea    Upper GI bleed    UTI (urinary tract infection) 09/27/2020   Acute pyelonephritis 09/27/2020   Unintentional weight loss 09/27/2020   Debility 09/27/2020   Atrial fibrillation ([place])    Severe protein-calorie malnutrition ([place]) 09/03/2020   Solitary kidney 08/20/2019   Chronic kidney disease (CKD), stage III (moderate) ([place]) 08/20/2019   RBBB 05/09/2019   Pressure injury of skin 01/15/2019   COPD (chronic obstructive pulmonary disease) ([place]) 01/14/2019   Hypertension 05/04/2018   Osteoporosis 11/02/2017   Coronary atherosclerosis 05/23/2016   Personal history of tobacco use, presenting hazards to health 03/18/2016   Idiopathic scoliosis 11/28/2014   Tobacco abuse 11/28/2014   Intrinsic ureteral obstruction 02/04/2014   Hydronephrosis 02/04/2014   Crossing vessel and stricture of ureter without hydronephrosis 02/04/2014   Carotid stenosis 05/23/2013   Pernicious anemia 06/26/2012   Hyperlipidemia 12/29/2011   Benign hypertensive renal disease 08/04/2011   Hypothyroidism 08/04/2011   PCP:  Valerie Roys,  DO Pharmacy:   [facility], [place], [place] [address] [place] [address] Phone: [phone] Fax: [phone]  [id number] - [place], [place] - [address] [place] [address] Phone: [phone] Fax: [phone]     Social Determinants of Health (SDOH) Interventions    Readmission Risk Interventions No flowsheet data found.

## 2020-12-12 DIAGNOSIS — N179 Acute kidney failure, unspecified: Secondary | ICD-10-CM | POA: Diagnosis not present

## 2020-12-12 DIAGNOSIS — J410 Simple chronic bronchitis: Secondary | ICD-10-CM | POA: Diagnosis not present

## 2020-12-12 DIAGNOSIS — N1831 Chronic kidney disease, stage 3a: Secondary | ICD-10-CM | POA: Diagnosis not present

## 2020-12-12 DIAGNOSIS — E039 Hypothyroidism, unspecified: Secondary | ICD-10-CM | POA: Diagnosis not present

## 2020-12-12 DIAGNOSIS — N3000 Acute cystitis without hematuria: Secondary | ICD-10-CM | POA: Diagnosis not present

## 2020-12-12 LAB — C-REACTIVE PROTEIN: CRP: 11.2 mg/dL — ABNORMAL HIGH (ref <1.0)

## 2020-12-12 LAB — CBC WITH DIFFERENTIAL/PLATELET
Abs Immature Granulocytes: 0.02 10*3/uL (ref 0.00–0.07)
Basophils Absolute: 0 10*3/uL (ref 0.0–0.1)
Basophils Relative: 1 %
Eosinophils Absolute: 0 10*3/uL (ref 0.0–0.5)
Eosinophils Relative: 0 %
HCT: 35.7 % — ABNORMAL LOW (ref 36.0–46.0)
Hemoglobin: 11.5 g/dL — ABNORMAL LOW (ref 12.0–15.0)
Immature Granulocytes: 1 %
Lymphocytes Relative: 18 %
Lymphs Abs: 0.7 10*3/uL (ref 0.7–4.0)
MCH: 29.6 pg (ref 26.0–34.0)
MCHC: 32.2 g/dL (ref 30.0–36.0)
MCV: 92 fL (ref 80.0–100.0)
Monocytes Absolute: 0.5 10*3/uL (ref 0.1–1.0)
Monocytes Relative: 13 %
Neutro Abs: 2.6 10*3/uL (ref 1.7–7.7)
Neutrophils Relative %: 67 %
Platelets: 381 10*3/uL (ref 150–400)
RBC: 3.88 MIL/uL (ref 3.87–5.11)
RDW: 17.3 % — ABNORMAL HIGH (ref 11.5–15.5)
WBC: 3.8 10*3/uL — ABNORMAL LOW (ref 4.0–10.5)
nRBC: 0 % (ref 0.0–0.2)

## 2020-12-12 LAB — COMPREHENSIVE METABOLIC PANEL
ALT: 9 U/L (ref 0–44)
AST: 11 U/L — ABNORMAL LOW (ref 15–41)
Albumin: 2.3 g/dL — ABNORMAL LOW (ref 3.5–5.0)
Alkaline Phosphatase: 62 U/L (ref 38–126)
Anion gap: 13 (ref 5–15)
BUN: 85 mg/dL — ABNORMAL HIGH (ref 8–23)
CO2: 18 mmol/L — ABNORMAL LOW (ref 22–32)
Calcium: 8.8 mg/dL — ABNORMAL LOW (ref 8.9–10.3)
Chloride: 112 mmol/L — ABNORMAL HIGH (ref 98–111)
Creatinine, Ser: 2.56 mg/dL — ABNORMAL HIGH (ref 0.44–1.00)
GFR, Estimated: 18 mL/min — ABNORMAL LOW (ref >60)
Glucose, Bld: 85 mg/dL (ref 70–99)
Potassium: 5.4 mmol/L — ABNORMAL HIGH (ref 3.5–5.1)
Sodium: 143 mmol/L (ref 135–145)
Total Bilirubin: 0.5 mg/dL (ref 0.3–1.2)
Total Protein: 6.3 g/dL — ABNORMAL LOW (ref 6.5–8.1)

## 2020-12-12 LAB — URINE CULTURE

## 2020-12-12 LAB — FIBRIN DERIVATIVES D-DIMER (ARMC ONLY): Fibrin derivatives D-dimer (ARMC): 597.15 ng/mL (FEU) — ABNORMAL HIGH (ref 0.00–499.00)

## 2020-12-12 LAB — MAGNESIUM: Magnesium: 2.1 mg/dL (ref 1.7–2.4)

## 2020-12-12 LAB — PROCALCITONIN: Procalcitonin: 1.03 ng/mL

## 2020-12-12 LAB — PHOSPHORUS: Phosphorus: 5.2 mg/dL — ABNORMAL HIGH (ref 2.5–4.6)

## 2020-12-12 LAB — T3: T3, Total: 36 ng/dL — ABNORMAL LOW (ref 71–180)

## 2020-12-12 LAB — LACTATE DEHYDROGENASE: LDH: 109 U/L (ref 98–192)

## 2020-12-12 MED ORDER — SODIUM POLYSTYRENE SULFONATE 15 GM/60ML PO SUSP
15.0000 g | Freq: Once | ORAL | Status: AC
  Administered 2020-12-12: 09:00:00 15 g via ORAL
  Filled 2020-12-12: qty 60

## 2020-12-12 NOTE — Consult Note (Signed)
Urology Consult  I have been asked to see the patient by Dr. Kurtis Bushman, for evaluation and management of chronic left hydronephrosis, acute in chronic insufficiency, possible UTI,   Chief Complaint: Dysuria  History of Present Illness: Cathy Solis is a 80 y.o. year old female with solitary left kidney with chronic UPJ obstruction status post ureteral stent presenting with dysuria, altered mental status and worsening weakness.  Labs and admission indicate acute on chronic renal insufficiency which has been worsening over the course of the admission.  She is also insulin diagnosed with COVID which she presumably contracted at [facility] tested positive on 11/16/2020.  Urinalysis consistent with stent, RBCs and WBCs.  Urine culture growing mixed flora.  Noncontrast CT scan performed on 12/10/2020 shows slightly improved however marked persistent left hydronephrosis to the level of the UPJ with stent in excellent position.  She has some bladder wall thickening.  This is stable from her previous scan on 10/13/2020 and slightly improved from scans in early November prior to ureteral stent placement.  Ureteral stent initially placed on 10/08/2020 by Dr. Diamantina Providence in the setting of solitary kidney, chronic left UPJ obstruction and UTI.  At the time, purulent urine was noted both in the bladder and from the kidney.  She did have improvement with urinary decompression prior to discharge on this particular admission.  Ureteral stent was discussed with plans for serial exchanges on an infrequent basis.  She was subsequently seen as an outpatient on 10/2021/21 at which time she was deemed a poor surgical candidate for definitive reconstruction.  Chronic   Past Medical History:  Diagnosis Date  . Breast cancer [facility]) 2006   Left breast, s/p radiation  . 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 Hypertension   . Kidney problem    Undeveloped R kidney  . Occlusion and stenosis of carotid artery without mention  of cerebral infarction   . Personal history of tobacco use, presenting hazards to health 03/18/2016  . Stroke [facility])    residual left sided weakness  . Toe infection    followed by Dr. Jens Som    Past Surgical History:  Procedure Laterality Date  . ABDOMINAL HYSTERECTOMY    . BLADDER REPAIR    . BREAST LUMPECTOMY Left   . BREAST SURGERY     left  . CAROTID ENDARTERECTOMY  2008   left  . COLONOSCOPY  2008  . CYSTOSCOPY WITH STENT PLACEMENT Left 10/08/2020   Procedure: CYSTOSCOPY WITH STENT PLACEMENT;  Surgeon: Billey Co, MD;  Location: [facility] ORS;  Service: Urology;  Laterality: Left;  . EYE SURGERY Right 2013   cataract  . KIDNEY SURGERY  1949    Home Medications:  Current Meds  Medication Sig  . acetaminophen (TYLENOL) 325 MG tablet Take 2 tablets (650 mg total) by mouth every 6 (six) hours as needed for mild pain (or Fever >/= 101).  [name] apixaban (ELIQUIS) 2.5 MG TABS tablet Take 1 tablet (2.5 mg total) by mouth 2 (two) times daily.  [name] levothyroxine (SYNTHROID) 150 MCG tablet Take 1 tablet (150 mcg total) by mouth daily.  [name] lisinopril (ZESTRIL) 10 MG tablet Take 1 tablet (10 mg total) by mouth daily. TAKE 1 TABLET BY MOUTH EVERY DAY  . magnesium oxide (MAG-OX) 400 (241.3 Mg) MG tablet Take 1 tablet (400 mg total) by mouth 2 (two) times daily.  . metoprolol tartrate (LOPRESSOR) 50 MG tablet Take 1 tablet (50 mg total) by mouth 2 (two) times  daily.  . ondansetron (ZOFRAN) 4 MG tablet Take 1 tablet (4 mg total) by mouth every 8 (eight) hours as needed for nausea or vomiting.  . pantoprazole (PROTONIX) 40 MG tablet Take 40 mg by mouth daily.    Allergies:  Allergies  Allergen Reactions  . Statins Other (See Comments)    Myalgias  . Zetia [Ezetimibe] Other (See Comments)    myalgias    Family History  Problem Relation Age of Onset  . Stroke Mother   . Heart disease Father   . Heart attack Maternal Grandfather     Social History:  reports that she has been smoking  cigarettes. She has a 15.00 pack-year smoking history. She has never used smokeless tobacco. She reports that she does not drink alcohol and does not use drugs.  ROS: A complete review of systems was performed.  All systems are negative except for pertinent findings as noted.  Physical Exam:  Vital signs in last 24 hours: Temp:  [97.8 F (36.6 C)-98.2 F (36.8 C)] 98.1 F (36.7 C) (01/22 1202) Pulse Rate:  [75-141] 84 (01/22 1202) Resp:  [15-18] 18 (01/22 1202) BP: (90-130)/(62-88) 90/68 (01/22 1202) SpO2:  [85 %-97 %] 90 % (01/22 1215) Constitutional:  Alert but not oriented., No acute distress.  Elderly, frail, somewhat cachectic.   HEENT: [place] AT, moist mucus membranes.  Trachea midline, no masses Cardiovascular: Regular rate and rhythm, no clubbing, cyanosis, or edema. Respiratory: Normal respiratory effort, lungs clear bilaterally GU: Foley draining maroon urine Neurologic: Grossly intact, no focal deficits, moving all 4 extremities Psychiatric: Normal mood and affect   Laboratory Data:  Recent Labs    12/10/20 1156 12/11/20 0449 12/12/20 0532  WBC 6.6 6.4 3.8*  HGB 11.1* 10.7* 11.5*  HCT 35.2* 33.9* 35.7*   Recent Labs    12/10/20 1156 12/11/20 0449 12/12/20 0532  NA 138 138 143  K 4.4 4.8 5.4*  CL 107 108 112*  CO2 20* 19* 18*  GLUCOSE 103* 95 85  BUN 85* 87* 85*  CREATININE 1.96* 2.24* 2.56*  CALCIUM 8.9 8.9 8.8*   No results for input(s): LABPT, INR in the last 72 hours. No results for input(s): LABURIN in the last 72 hours. Results for orders placed or performed during the hospital encounter of 12/10/20  SARS CORONAVIRUS 2 (TAT 6-24 HRS) Nasopharyngeal Nasopharyngeal Swab     Status: Abnormal   Collection Time: 12/10/20  9:21 PM   Specimen: Nasopharyngeal Swab  Result Value Ref Range Status   SARS Coronavirus 2 POSITIVE (A) NEGATIVE Final    Comment: (NOTE) SARS-CoV-2 target nucleic acids are DETECTED.  The SARS-CoV-2 RNA is generally detectable in  upper and lower respiratory specimens during the acute phase of infection. Positive results are indicative of the presence of SARS-CoV-2 RNA. Clinical correlation with patient history and other diagnostic information is  necessary to determine patient infection status. Positive results do not rule out bacterial infection or co-infection with other viruses.  The expected result is Negative.  Fact Sheet for Patients: SugarRoll.be  Fact Sheet for Healthcare Providers: https://www.woods-mathews.com/  This test is not yet approved or cleared by the [place] FDA and  has been authorized for detection and/or diagnosis of SARS-CoV-2 by FDA under an Emergency Use Authorization (EUA). This EUA will remain  in effect (meaning this test can be used) for the duration of the COVID-19 declaration under Section 564(b)(1) of the Act, 21 U. S.C. section 360bbb-3(b)(1), unless the authorization is terminated or revoked sooner.  Performed at [facility], [place] [address] [place], [place] [address]   Urine Culture     Status: Abnormal   Collection Time: 12/10/20  9:21 PM   Specimen: Urine, Random  Result Value Ref Range Status   Specimen Description   Final    URINE, RANDOM Performed at [facility], [address] [place], [place] [address]    Special Requests   Final    NONE Performed at [facility], [address] [place], [place] [address]    Culture MULTIPLE SPECIES PRESENT, SUGGEST RECOLLECTION (A)  Final   Report Status 12/12/2020 FINAL  Final     Radiologic Imaging: DG Chest 1 View  Result Date: 12/10/2020 CLINICAL DATA:  Right basilar infiltrate on recent CT EXAM: CHEST  1 VIEW COMPARISON:  CT from earlier in the same day. FINDINGS: Cardiac shadow is within normal limits. Aortic calcifications are seen. Bilateral nipple shadows are noted. Mild linear atelectatic changes are noted in the right base similar to  that seen on the prior CT examination. Lead is noted over the upper right chest wall. IMPRESSION: Linear atelectatic changes in the right base similar to that seen on prior CT examination. No other focal abnormality is noted. Electronically Signed   By: Inez Catalina M.D.   On: 12/10/2020 22:27   CT Head Wo Contrast  Result Date: 12/10/2020 CLINICAL DATA:  Mental status change EXAM: CT HEAD WITHOUT CONTRAST TECHNIQUE: Contiguous axial images were obtained from the base of the skull through the vertex without intravenous contrast. COMPARISON:  None. FINDINGS: Brain: No evidence of acute territorial infarction, hemorrhage, hydrocephalus,extra-axial collection or mass lesion/mass effect. There is dilatation the ventricles and sulci consistent with age-related atrophy. Low-attenuation changes in the deep white matter consistent with small vessel ischemia. Vascular: No hyperdense vessel or unexpected calcification. Skull: The skull is intact. No fracture or focal lesion identified. Sinuses/Orbits: The visualized paranasal sinuses and mastoid air cells are clear. The orbits and globes intact. Other: None IMPRESSION: No acute intracranial abnormality. Findings consistent with age related atrophy and chronic small vessel ischemia Electronically Signed   By: Prudencio Pair M.D.   On: 12/10/2020 20:58   CT Renal Stone Study  Result Date: 12/10/2020 CLINICAL DATA:  Burning with urination. EXAM: CT ABDOMEN AND PELVIS WITHOUT CONTRAST TECHNIQUE: Multidetector CT imaging of the abdomen and pelvis was performed following the standard protocol without IV contrast. COMPARISON:  October 13, 2020 FINDINGS: Lower chest: Mild atelectasis and/or infiltrate is seen within the posterior aspect of the right lung base. Hepatobiliary: No focal liver abnormality is seen. No gallstones, gallbladder wall thickening, or biliary dilatation. The gallstone seen within the gallbladder lumen on the prior study is not visualized on the current  exam. Pancreas: Unremarkable. No pancreatic ductal dilatation or surrounding inflammatory changes. Spleen: Normal in size without focal abnormality. Adrenals/Urinary Tract: Adrenal glands are unremarkable. The right kidney is absent. The left kidney is normal in size. A left-sided endo ureteral stent is seen and is unchanged in position. Stable marked severity left-sided hydronephrosis is also noted. Asymmetric moderate to marked severity lateral urinary bladder wall thickening is seen, right greater than left. Stomach/Bowel: Stomach is within normal limits. The appendix is not identified. No evidence of bowel wall thickening, distention, or inflammatory changes. Vascular/Lymphatic: Aortic atherosclerosis. No enlarged abdominal or pelvic lymph nodes. Reproductive: Status post hysterectomy. No adnexal masses. Other: No abdominal wall hernia or abnormality. The small right inguinal hernia is seen on the prior study is  no longer present. No abdominopelvic ascites. Musculoskeletal: Multilevel degenerative changes seen throughout the lumbar spine. Marked severity dextroscoliosis is also seen. IMPRESSION: 1. Mild right basilar atelectasis and/or infiltrate. 2. Stable marked severity left-sided hydronephrosis with a left-sided endo ureteral stent in place. 3. Asymmetric moderate to marked severity urinary bladder wall thickening which may represent sequelae associated with cystitis. Correlation with urinalysis is recommended to exclude the presence of an underlying neoplastic process. 4. Aortic atherosclerosis. Aortic Atherosclerosis (ICD10-I70.0). Electronically Signed   By: Virgina Norfolk M.D.   On: 12/10/2020 21:04    Impression/Plan:  80 year old female with solitary left kidney with chronic UPJ obstruction now managed with indwelling stent admitted with acute on chronic renal insufficiency/probable UTI.  1.  Chronic left UPJ obstruction-review of multiple serial cross-sectional imaging revealed that her  severe left hydronephrosis remains persistent to the level of the UPJ with stent in good position.  When measuring renal pelvic diameter pre and post- stent, the maximal area of dilation extending from the edge of the renal pelvis into the calyx was previously 9 cm and now measures approximately 6 cm which in fact is an improvement.  This is suggestive that the stent is in fact continuing to function.  I recommended a Foley catheter overnight for maximal urinary drainage along with hydration.  If her creatinine fails to start to improve, we will discuss the possibility of exchange to percutaneous nephrostomy tube with her family member although would like to avoid this if possible.  2.  Acute on chronic renal insufficiency-likely multifactorial including prerenal component.  This is also discussed with Dr. Juleen China.  Gentle hydration and maximal urinary decompression as above.  Reassess in the a.m.  3.  Probable UTI- clinically symptomatic UTI with suspicious appearing urine although ended up growing multiple species consistent with chronic colonization from stent.  Agree with empiric antibiotics currently on ceftriaxone.  4.  Gross hematuria- noted upon Foley catheter placement with maroon-colored urine likely from chronic stent irritation/probable UTI.  Does not appear to be severe and her hemoglobin has been stable.  Flush catheter as needed.  We will follow-up labs tomorrow, if creatinine fails to improve further discussion with patient/her healthcare proxy regarding nephrostomy tube.  Case was discussed with Dr. Kurtis Bushman as well as Dr. Juleen China.  12/12/2020, 2:25 PM  Hollice Espy,  MD

## 2020-12-12 NOTE — Consult Note (Signed)
[facility]  CONSULT NOTE    Date: 12/12/2020                  Patient Name:  Cathy Solis  MRN: [id number]  DOB: 08-28-1941  Age / Sex: 80 y.o., female         PCP: Valerie Roys, DO                 Service Requesting Consult: Dr. Kurtis Bushman                 Reason for Consult: Acute kidney injury            History of Present Illness: [name] admitted for COVID-19 infection with COPD from skilled nursing facility. Patient had altered mental status and there is concern for urinary tract infection. Empirically started on ceftriaxone.   Patient uses oxygen at baseline. Unable to give a history.    Medications: Outpatient medications: Medications Prior to Admission  Medication Sig Dispense Refill Last Dose  . acetaminophen (TYLENOL) 325 MG tablet Take 2 tablets (650 mg total) by mouth every 6 (six) hours as needed for mild pain (or Fever >/= 101).   prn at prn  . apixaban (ELIQUIS) 2.5 MG TABS tablet Take 1 tablet (2.5 mg total) by mouth 2 (two) times daily.   Past Week at Unknown time  . levothyroxine (SYNTHROID) 150 MCG tablet Take 1 tablet (150 mcg total) by mouth daily. 90 tablet 3 Past Week at Unknown time  . lisinopril (ZESTRIL) 10 MG tablet Take 1 tablet (10 mg total) by mouth daily. TAKE 1 TABLET BY MOUTH EVERY DAY 90 tablet 1 Past Week at Unknown time  . magnesium oxide (MAG-OX) 400 (241.3 Mg) MG tablet Take 1 tablet (400 mg total) by mouth 2 (two) times daily. 60 tablet 0 Past Week at Unknown time  . metoprolol tartrate (LOPRESSOR) 50 MG tablet Take 1 tablet (50 mg total) by mouth 2 (two) times daily.   Past Week at Unknown time  . ondansetron (ZOFRAN) 4 MG tablet Take 1 tablet (4 mg total) by mouth every 8 (eight) hours as needed for nausea or vomiting.  1 prn at prn  . pantoprazole (PROTONIX) 40 MG tablet Take 40 mg by mouth daily.   Past Week at Unknown time    Current medications: Current Facility-Administered Medications   Medication Dose Route Frequency Provider Last Rate Last Admin  . 0.9 %  sodium chloride infusion   Intravenous Continuous Dwyane Dee, MD   Stopped at 12/12/20 1256  . acetaminophen (TYLENOL) tablet 1,000 mg  1,000 mg Oral Q6H PRN Zada Finders R, MD   1,000 mg at 12/10/20 2341   Or  . acetaminophen (TYLENOL) suppository 650 mg  650 mg Rectal Q6H PRN Zada Finders R, MD      . albuterol (VENTOLIN HFA) 108 (90 Base) MCG/ACT inhaler 2 puff  2 puff Inhalation Q6H PRN Zada Finders R, MD      . apixaban (ELIQUIS) tablet 2.5 mg  2.5 mg Oral BID Zada Finders R, MD   2.5 mg at 12/12/20 0919  . cefTRIAXone (ROCEPHIN) 1 g in sodium chloride 0.9 % 100 mL IVPB  1 g Intravenous Q24H Lenore Cordia, MD   Stopped at 12/11/20 1319  . dexamethasone (DECADRON) injection 6 mg  6 mg Intravenous Daily Dwyane Dee, MD   6 mg at 12/12/20 0919  . feeding supplement (ENSURE ENLIVE / ENSURE PLUS) liquid 237  mL  237 mL Oral BID BM Dwyane Dee, MD   237 mL at 12/12/20 1339  . fluticasone furoate-vilanterol (BREO ELLIPTA) 200-25 MCG/INH 1 puff  1 puff Inhalation Daily Patel, Vishal R, MD      . hydrALAZINE (APRESOLINE) injection 10 mg  10 mg Intravenous Q4H PRN Dwyane Dee, MD      . labetalol (NORMODYNE) injection 10 mg  10 mg Intravenous Q4H PRN Dwyane Dee, MD      . levothyroxine (SYNTHROID) tablet 150 mcg  150 mcg Oral Daily Lenore Cordia, MD   150 mcg at 12/12/20 0435  . metoprolol tartrate (LOPRESSOR) tablet 50 mg  50 mg Oral BID Lenore Cordia, MD   50 mg at 12/12/20 [id number]  . multivitamin with minerals tablet 1 tablet  1 tablet Oral Daily Dwyane Dee, MD   1 tablet at 12/12/20 0919  . ondansetron (ZOFRAN) tablet 4 mg  4 mg Oral Q6H PRN Lenore Cordia, MD       Or  . ondansetron (ZOFRAN) injection 4 mg  4 mg Intravenous Q6H PRN Lenore Cordia, MD      . remdesivir 100 mg in sodium chloride 0.9 % 100 mL IVPB  100 mg Intravenous Daily Lorna Dibble, RPH 200 mL/hr at 12/12/20 0919 100 mg at  12/12/20 0919      Allergies: Allergies  Allergen Reactions  . Statins Other (See Comments)    Myalgias  . Zetia [Ezetimibe] Other (See Comments)    myalgias      Past Medical History: Past Medical History:  Diagnosis Date  . Breast cancer [facility]) 2006   Left breast, s/p radiation  . 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 Hypertension   . Kidney problem    Undeveloped R kidney  . 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 Personal history of tobacco use, presenting hazards to health 03/18/2016  . Stroke [facility])    residual left sided weakness  . Toe infection    followed by Dr. Jens Som     Past Surgical History: Past Surgical History:  Procedure Laterality Date  . ABDOMINAL HYSTERECTOMY    . BLADDER REPAIR    . BREAST LUMPECTOMY Left   . BREAST SURGERY     left  . CAROTID ENDARTERECTOMY  2008   left  . COLONOSCOPY  2008  . CYSTOSCOPY WITH STENT PLACEMENT Left 10/08/2020   Procedure: CYSTOSCOPY WITH STENT PLACEMENT;  Surgeon: Billey Co, MD;  Location: [facility] ORS;  Service: Urology;  Laterality: Left;  . EYE SURGERY Right 2013   cataract  . KIDNEY SURGERY  1949     Family History: Family History  Problem Relation Age of Onset  . Stroke Mother   . Heart disease Father   . Heart attack Maternal Grandfather      Social History: Social History   Socioeconomic History  . Marital status: Widowed    Spouse name: Not on file  . Number of children: Not on file  . Years of education: Not on file  . Highest education level: High school graduate  Occupational History  . Not on file  Tobacco Use  . Smoking status: Current Some Day Smoker    Packs/day: 0.50    Years: 30.00    Pack years: 15.00    Types: Cigarettes    Last attempt to quit: 01/08/2019    Years since quitting: 1.9  . Smokeless tobacco: Never Used  Vaping  Use  . Vaping Use: Never used  Substance and Sexual Activity  . Alcohol use: No  . Drug use: No  .  Sexual activity: Never  Other Topics Concern  . Not on file  Social History Narrative  . Not on file   Social Determinants of Health   Financial Resource Strain: Low Risk   . Difficulty of Paying Living Expenses: Not hard at all  Food Insecurity: No Food Insecurity  . Worried About Charity fundraiser in the Last Year: Never true  . Ran Out of Food in the Last Year: Never true  Transportation Needs: No Transportation Needs  . Lack of Transportation (Medical): No  . Lack of Transportation (Non-Medical): No  Physical Activity: Inactive  . Days of Exercise per Week: 0 days  . Minutes of Exercise per Session: 0 min  Stress: No Stress Concern Present  . Feeling of Stress : Not at all  Social Connections: Moderately Isolated  . Frequency of Communication with Friends and Family: More than three times a week  . Frequency of Social Gatherings with Friends and Family: More than three times a week  . Attends Religious Services: More than 4 times per year  . Active Member of Clubs or Organizations: No  . Attends Archivist Meetings: Never  . Marital Status: Widowed  Intimate Partner Violence: Not At Risk  . Fear of Current or Ex-Partner: No  . Emotionally Abused: No  . Physically Abused: No  . Sexually Abused: No     Review of Systems: Review of Systems  Unable to perform ROS: Dementia    Vital Signs: Blood pressure (!) 89/52, pulse 84, temperature 97.8 F (36.6 C), resp. rate 16, height 5\' 8"  (1.727 m), weight 43.1 kg, SpO2 99 %.  Weight trends: Filed Weights   12/10/20 1153  Weight: 43.1 kg    Physical Exam: General: NAD,   Head: Normocephalic, atraumatic. Moist oral mucosal membranes  Eyes: Anicteric, PERRL  Neck: Supple, trachea midline  Lungs:  Bilateral wheezing,   Heart: Regular rate and rhythm  Abdomen:  Soft, nontender,   Extremities:  no peripheral edema.  Neurologic: Nonfocal, moving all four extremities  Skin: No lesions  Access:       Lab  results: Basic Metabolic Panel: Recent Labs  Lab 12/10/20 1156 12/11/20 0449 12/12/20 0532  NA 138 138 143  K 4.4 4.8 5.4*  CL 107 108 112*  CO2 20* 19* 18*  GLUCOSE 103* 95 85  BUN 85* 87* 85*  CREATININE 1.96* 2.24* 2.56*  CALCIUM 8.9 8.9 8.8*  MG 2.2 2.1 2.1  PHOS 3.8 4.4 5.2*    Liver Function Tests: Recent Labs  Lab 12/10/20 1156 12/12/20 0532  AST 14* 11*  ALT 8 9  ALKPHOS 60 62  BILITOT 0.8 0.5  PROT 6.3* 6.3*  ALBUMIN 2.5* 2.3*   Recent Labs  Lab 12/10/20 1156  LIPASE 26   No results for input(s): AMMONIA in the last 168 hours.  CBC: Recent Labs  Lab 12/10/20 1156 12/11/20 0449 12/12/20 0532  WBC 6.6 6.4 3.8*  NEUTROABS  --   --  2.6  HGB 11.1* 10.7* 11.5*  HCT 35.2* 33.9* 35.7*  MCV 93.6 93.1 92.0  PLT 397 383 381    Cardiac Enzymes: No results for input(s): CKTOTAL, CKMB, CKMBINDEX, TROPONINI in the last 168 hours.  BNP: Invalid input(s): POCBNP  CBG: No results for input(s): GLUCAP in the last 168 hours.  Microbiology: Results for orders placed  or performed during the hospital encounter of 12/10/20  SARS CORONAVIRUS 2 (TAT 6-24 HRS) Nasopharyngeal Nasopharyngeal Swab     Status: Abnormal   Collection Time: 12/10/20  9:21 PM   Specimen: Nasopharyngeal Swab  Result Value Ref Range Status   SARS Coronavirus 2 POSITIVE (A) NEGATIVE Final    Comment: (NOTE) SARS-CoV-2 target nucleic acids are DETECTED.  The SARS-CoV-2 RNA is generally detectable in upper and lower respiratory specimens during the acute phase of infection. Positive results are indicative of the presence of SARS-CoV-2 RNA. Clinical correlation with patient history and other diagnostic information is  necessary to determine patient infection status. Positive results do not rule out bacterial infection or co-infection with other viruses.  The expected result is Negative.  Fact Sheet for Patients: SugarRoll.be  Fact Sheet for Healthcare  Providers: https://www.woods-mathews.com/  This test is not yet approved or cleared by the [place] FDA and  has been authorized for detection and/or diagnosis of SARS-CoV-2 by FDA under an Emergency Use Authorization (EUA). This EUA will remain  in effect (meaning this test can be used) for the duration of the COVID-19 declaration under Section 564(b)(1) of the Act, 21 U. S.C. section 360bbb-3(b)(1), unless the authorization is terminated or revoked sooner.   Performed at [facility], [place] [address], [place], [place] [address]   Urine Culture     Status: Abnormal   Collection Time: 12/10/20  9:21 PM   Specimen: Urine, Random  Result Value Ref Range Status   Specimen Description   Final    URINE, RANDOM Performed at [facility], [address] [place], [place] [phone]    Special Requests   Final    NONE Performed at [facility], [address] [place], [place] [address]    Culture MULTIPLE SPECIES PRESENT, SUGGEST RECOLLECTION (A)  Final   Report Status 12/12/2020 FINAL  Final    Coagulation Studies: No results for input(s): LABPROT, INR in the last 72 hours.  Urinalysis: Recent Labs    12/10/20 2121  COLORURINE YELLOW*  LABSPEC 1.016  PHURINE 7.0  GLUCOSEU NEGATIVE  HGBUR MODERATE*  BILIRUBINUR NEGATIVE  KETONESUR 5*  PROTEINUR >=300*  NITRITE NEGATIVE  LEUKOCYTESUR TRACE*      Imaging: DG Chest 1 View  Result Date: 12/10/2020 CLINICAL DATA:  Right basilar infiltrate on recent CT EXAM: CHEST  1 VIEW COMPARISON:  CT from earlier in the same day. FINDINGS: Cardiac shadow is within normal limits. Aortic calcifications are seen. Bilateral nipple shadows are noted. Mild linear atelectatic changes are noted in the right base similar to that seen on the prior CT examination. Lead is noted over the upper right chest wall. IMPRESSION: Linear atelectatic changes in the right base similar to that seen on prior CT  examination. No other focal abnormality is noted. Electronically Signed   By: Inez Catalina M.D.   On: 12/10/2020 22:27   CT Head Wo Contrast  Result Date: 12/10/2020 CLINICAL DATA:  Mental status change EXAM: CT HEAD WITHOUT CONTRAST TECHNIQUE: Contiguous axial images were obtained from the base of the skull through the vertex without intravenous contrast. COMPARISON:  None. FINDINGS: Brain: No evidence of acute territorial infarction, hemorrhage, hydrocephalus,extra-axial collection or mass lesion/mass effect. There is dilatation the ventricles and sulci consistent with age-related atrophy. Low-attenuation changes in the deep white matter consistent with small vessel ischemia. Vascular: No hyperdense vessel or unexpected calcification. Skull: The skull is intact. No fracture or focal lesion identified. Sinuses/Orbits: The visualized  paranasal sinuses and mastoid air cells are clear. The orbits and globes intact. Other: None IMPRESSION: No acute intracranial abnormality. Findings consistent with age related atrophy and chronic small vessel ischemia Electronically Signed   By: Prudencio Pair M.D.   On: 12/10/2020 20:58   CT Renal Stone Study  Result Date: 12/10/2020 CLINICAL DATA:  Burning with urination. EXAM: CT ABDOMEN AND PELVIS WITHOUT CONTRAST TECHNIQUE: Multidetector CT imaging of the abdomen and pelvis was performed following the standard protocol without IV contrast. COMPARISON:  October 13, 2020 FINDINGS: Lower chest: Mild atelectasis and/or infiltrate is seen within the posterior aspect of the right lung base. Hepatobiliary: No focal liver abnormality is seen. No gallstones, gallbladder wall thickening, or biliary dilatation. The gallstone seen within the gallbladder lumen on the prior study is not visualized on the current exam. Pancreas: Unremarkable. No pancreatic ductal dilatation or surrounding inflammatory changes. Spleen: Normal in size without focal abnormality. Adrenals/Urinary Tract:  Adrenal glands are unremarkable. The right kidney is absent. The left kidney is normal in size. A left-sided endo ureteral stent is seen and is unchanged in position. Stable marked severity left-sided hydronephrosis is also noted. Asymmetric moderate to marked severity lateral urinary bladder wall thickening is seen, right greater than left. Stomach/Bowel: Stomach is within normal limits. The appendix is not identified. No evidence of bowel wall thickening, distention, or inflammatory changes. Vascular/Lymphatic: Aortic atherosclerosis. No enlarged abdominal or pelvic lymph nodes. Reproductive: Status post hysterectomy. No adnexal masses. Other: No abdominal wall hernia or abnormality. The small right inguinal hernia is seen on the prior study is no longer present. No abdominopelvic ascites. Musculoskeletal: Multilevel degenerative changes seen throughout the lumbar spine. Marked severity dextroscoliosis is also seen. IMPRESSION: 1. Mild right basilar atelectasis and/or infiltrate. 2. Stable marked severity left-sided hydronephrosis with a left-sided endo ureteral stent in place. 3. Asymmetric moderate to marked severity urinary bladder wall thickening which may represent sequelae associated with cystitis. Correlation with urinalysis is recommended to exclude the presence of an underlying neoplastic process. 4. Aortic atherosclerosis. Aortic Atherosclerosis (ICD10-I70.0). Electronically Signed   By: Virgina Norfolk M.D.   On: 12/10/2020 21:04      Assessment & Plan: Ms. KAITHLYN TEAGLE is a 80 y.o. 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 GFR of 49 on 10/21/2020.  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  Acute kidney injury secondary to prerenal azotemia - Appreciate urology input. Indwelling foley catheter placed. 823mL urine output since placed.  - holding lisinopril.   2. Hypertension: hypotensive this morning, consistent with prerenal azotemia. Holding lisinopril - Continue IV fluids: NS at 50mL/hr  2. Urinary tract infection: recent E. Coli infection in 09/2020.  - Continue empiric ceftriaxone.     LOS: 1 Silveria Botz 1/22/20224:43 PM

## 2020-12-12 NOTE — Progress Notes (Addendum)
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  She was unable to provide any collateral information on admission  Last hospitalization was 10/06/2020 until 10/21/2020 for abdominal pain which ultimately resolved on its own.  She had been seen by GI and general surgery during that hospitalization as well.  She was discharged to [facility]. On admission it is noted that COVID PCR was positive.  Her son also stated that she was positive for COVID on 11/16/2020 at [facility].  On work-up in the ER she was found to have UA concerning for possible infection and was started on Rocephin; urine culture was sent. At that time her PCR had not yet resulted.   CT head was unremarkable for acute findings.  Age-related atrophy and chronic small vessel ischemia noted. CT renal stone performed revealing stable but severe left-sided hydronephrosis with left sided Endo ureteral stent in place.  Urinary bladder wall thickening possibly due to cystitis.  And right basilar atelectasis/infiltrate.  CXR also was relatively clear except for the mild linear atelectatic changes appreciated in the right base that were seen on CT as well.  CC:A/CKD with worsening today cr 2.56  Consultants:     Procedures:   Antimicrobials:   ceftriaxone 1/20>>   Subjective: Pt lying in bed, quiet . Denies sob, or cp   Objective: Vitals:   12/12/20 0039 12/12/20 0437 12/12/20 0829 12/12/20 0832  BP: 95/65 121/77 128/62   Pulse: 75 93 (!) 109   Resp: 16 15 18    Temp: 97.8 F (36.6 C) 97.8 F (36.6 C) 97.9 F (36.6 C)   TempSrc:  Oral Oral Axillary   SpO2: 97% 97% (!) 86% 92%  Weight:      Height:        Intake/Output Summary (Last 24 hours) at 12/12/2020 1153 Last data filed at 12/12/2020 0929 Gross per 24 hour  Intake 1239.11 ml  Output 900 ml  Net 339.11 ml   Filed Weights   12/10/20 1153  Weight: 43.1 kg    Examination:  General exam: Appears calm and comfortable  Respiratory system: Clear to auscultation. Respiratory effort normal. Cardiovascular system: S1 & S2 heard, RRR. No JVD, murmurs, rubs, gallops or clicks. Gastrointestinal system: Abdomen is nondistended, soft and nontender.  Normal bowel sounds heard. Central nervous system: awake alert Extremities: no edema Skin: warm,dry Psychiatry:Mood & affect appropriate in current setting.     Data Reviewed: I have personally reviewed following labs and imaging studies  CBC: Recent Labs  Lab 12/10/20 1156 12/11/20 0449 12/12/20 0532  WBC 6.6 6.4 3.8*  NEUTROABS  --   --  2.6  HGB 11.1* 10.7* 11.5*  HCT 35.2* 33.9* 35.7*  MCV 93.6 93.1 92.0  PLT 397 383 [id number]   Basic Metabolic Panel: Recent Labs  Lab 12/10/20 1156 12/11/20 0449 12/12/20 0532  NA 138 138 143  K 4.4 4.8 5.4*  CL 107 108 112*  CO2 20* 19* 18*  GLUCOSE 103* 95 85  BUN 85* 87* 85*  CREATININE 1.96* 2.24* 2.56*  CALCIUM 8.9 8.9 8.8*  MG 2.2 2.1 2.1  PHOS 3.8 4.4 5.2*  GFR: Estimated Creatinine Clearance: 11.7 mL/min (A) (by C-G formula based on SCr of 2.56 mg/dL (H)). Liver Function Tests: Recent Labs  Lab 12/10/20 1156 12/12/20 0532  AST 14* 11*  ALT 8 9  ALKPHOS 60 62  BILITOT 0.8 0.5  PROT 6.3* 6.3*  ALBUMIN 2.5* 2.3*   Recent Labs  Lab 12/10/20 1156  LIPASE 26   No results for input(s): AMMONIA in the last 168 hours. Coagulation Profile: No results for input(s): INR, PROTIME in the last 168 hours. Cardiac Enzymes: No results for input(s): CKTOTAL, CKMB, CKMBINDEX, TROPONINI in the last 168 hours. BNP (last 3 results) No results for  input(s): PROBNP in the last 8760 hours. HbA1C: No results for input(s): HGBA1C in the last 72 hours. CBG: No results for input(s): GLUCAP in the last 168 hours. Lipid Profile: No results for input(s): CHOL, HDL, LDLCALC, TRIG, CHOLHDL, LDLDIRECT in the last 72 hours. Thyroid Function Tests: Recent Labs    12/11/20 0449 12/11/20 1158  TSH 11.746*  --   FREET4  --  1.18*   Anemia Panel: Recent Labs    12/11/20 0449  VITAMINB12 612   Sepsis Labs: Recent Labs  Lab 12/11/20 1158 12/12/20 0532  PROCALCITON 0.53 1.03    Recent Results (from the past 240 hour(s))  SARS CORONAVIRUS 2 (TAT 6-24 HRS) Nasopharyngeal Nasopharyngeal Swab     Status: Abnormal   Collection Time: 12/10/20  9:21 PM   Specimen: Nasopharyngeal Swab  Result Value Ref Range Status   SARS Coronavirus 2 POSITIVE (A) NEGATIVE Final    Comment: (NOTE) SARS-CoV-2 target nucleic acids are DETECTED.  The SARS-CoV-2 RNA is generally detectable in upper and lower respiratory specimens during the acute phase of infection. Positive results are indicative of the presence of SARS-CoV-2 RNA. Clinical correlation with patient history and other diagnostic information is  necessary to determine patient infection status. Positive results do not rule out bacterial infection or co-infection with other viruses.  The expected result is Negative.  Fact Sheet for Patients: SugarRoll.be  Fact Sheet for Healthcare Providers: https://www.woods-mathews.com/  This test is not yet approved or cleared by the [place] FDA and  has been authorized for detection and/or diagnosis of SARS-CoV-2 by FDA under an Emergency Use Authorization (EUA). This EUA will remain  in effect (meaning this test can be used) for the duration of the COVID-19 declaration under Section 564(b)(1) of the Act, 21 U. S.C. section 360bbb-3(b)(1), unless the authorization is terminated or revoked  sooner.   Performed at [facility], [place] [address] [place], [place] [address]   Urine Culture     Status: Abnormal   Collection Time: 12/10/20  9:21 PM   Specimen: Urine, Random  Result Value Ref Range Status   Specimen Description   Final    URINE, RANDOM Performed at [facility], [address], [place], [place] [address]    Special Requests   Final    NONE Performed at [facility], [address] [place], [place] [address]    Culture MULTIPLE SPECIES PRESENT, SUGGEST RECOLLECTION (A)  Final   Report Status 12/12/2020 FINAL  Final         Radiology Studies: DG Chest 1 View  Result Date: 12/10/2020 CLINICAL DATA:  Right basilar infiltrate on recent CT EXAM: CHEST  1 VIEW COMPARISON:  CT from earlier in the same day. FINDINGS: Cardiac shadow is within normal limits. Aortic calcifications are seen. Bilateral nipple shadows are noted. Mild linear atelectatic changes  are noted in the right base similar to that seen on the prior CT examination. Lead is noted over the upper right chest wall. IMPRESSION: Linear atelectatic changes in the right base similar to that seen on prior CT examination. No other focal abnormality is noted. Electronically Signed   By: Inez Catalina M.D.   On: 12/10/2020 22:27   CT Head Wo Contrast  Result Date: 12/10/2020 CLINICAL DATA:  Mental status change EXAM: CT HEAD WITHOUT CONTRAST TECHNIQUE: Contiguous axial images were obtained from the base of the skull through the vertex without intravenous contrast. COMPARISON:  None. FINDINGS: Brain: No evidence of acute territorial infarction, hemorrhage, hydrocephalus,extra-axial collection or mass lesion/mass effect. There is dilatation the ventricles and sulci consistent with age-related atrophy. Low-attenuation changes in the deep white matter consistent with small vessel ischemia. Vascular: No hyperdense vessel or unexpected calcification. Skull: The skull is intact. No fracture or  focal lesion identified. Sinuses/Orbits: The visualized paranasal sinuses and mastoid air cells are clear. The orbits and globes intact. Other: None IMPRESSION: No acute intracranial abnormality. Findings consistent with age related atrophy and chronic small vessel ischemia Electronically Signed   By: Prudencio Pair M.D.   On: 12/10/2020 20:58   CT Renal Stone Study  Result Date: 12/10/2020 CLINICAL DATA:  Burning with urination. EXAM: CT ABDOMEN AND PELVIS WITHOUT CONTRAST TECHNIQUE: Multidetector CT imaging of the abdomen and pelvis was performed following the standard protocol without IV contrast. COMPARISON:  October 13, 2020 FINDINGS: Lower chest: Mild atelectasis and/or infiltrate is seen within the posterior aspect of the right lung base. Hepatobiliary: No focal liver abnormality is seen. No gallstones, gallbladder wall thickening, or biliary dilatation. The gallstone seen within the gallbladder lumen on the prior study is not visualized on the current exam. Pancreas: Unremarkable. No pancreatic ductal dilatation or surrounding inflammatory changes. Spleen: Normal in size without focal abnormality. Adrenals/Urinary Tract: Adrenal glands are unremarkable. The right kidney is absent. The left kidney is normal in size. A left-sided endo ureteral stent is seen and is unchanged in position. Stable marked severity left-sided hydronephrosis is also noted. Asymmetric moderate to marked severity lateral urinary bladder wall thickening is seen, right greater than left. Stomach/Bowel: Stomach is within normal limits. The appendix is not identified. No evidence of bowel wall thickening, distention, or inflammatory changes. Vascular/Lymphatic: Aortic atherosclerosis. No enlarged abdominal or pelvic lymph nodes. Reproductive: Status post hysterectomy. No adnexal masses. Other: No abdominal wall hernia or abnormality. The small right inguinal hernia is seen on the prior study is no longer present. No abdominopelvic  ascites. Musculoskeletal: Multilevel degenerative changes seen throughout the lumbar spine. Marked severity dextroscoliosis is also seen. IMPRESSION: 1. Mild right basilar atelectasis and/or infiltrate. 2. Stable marked severity left-sided hydronephrosis with a left-sided endo ureteral stent in place. 3. Asymmetric moderate to marked severity urinary bladder wall thickening which may represent sequelae associated with cystitis. Correlation with urinalysis is recommended to exclude the presence of an underlying neoplastic process. 4. Aortic atherosclerosis. Aortic Atherosclerosis (ICD10-I70.0). Electronically Signed   By: Virgina Norfolk M.D.   On: 12/10/2020 21:04        Scheduled Meds: . apixaban  2.5 mg Oral BID  . dexamethasone (DECADRON) injection  6 mg Intravenous Daily  . feeding supplement  237 mL Oral BID BM  . fluticasone furoate-vilanterol  1 puff Inhalation Daily  . levothyroxine  150 mcg Oral Daily  . metoprolol tartrate  50 mg Oral BID  . multivitamin with minerals  1 tablet Oral Daily  Continuous Infusions: . sodium chloride 75 mL/hr at 12/12/20 0439  . cefTRIAXone (ROCEPHIN)  IV Stopped (12/11/20 1319)  . remdesivir 100 mg in NS 100 mL 100 mg (12/12/20 0919)    Assessment & Plan:   Principal Problem:   Acute renal failure superimposed on stage 3a chronic kidney disease (HCC) Active Problems:   Hypothyroidism   Hypertension   COPD (chronic obstructive pulmonary disease) (HCC)   Atrial fibrillation (HCC)   COVID-19   Acute cystitis   Normocytic anemia  Acute renal failure superimposed on stage 3a chronic kidney disease (HCC) - Baseline creatinine approximately 1.2-1.3.  Creatinine on admission was 1.96 and has further up trended 1/21-patient's creatinine is worsening, today 2.56 Initially suspected prerenal however could be possible to obstruction. Will consult nephrology (spoke to Dr. Wynelle Link) and urology (Dr. Apolinar Junes notified and will see) Will continue IV  fluids and monitor output Will place Foley per urologist recommendation via chat   Acute cystitis - History of recent UTI in November 2021 as well as left UPJ obstruction requiring ureteral stent placement on 10/08/2020.  She also has associated left-sided hydronephrosis due to this -CT renal stone - stable but ongoing severe left-sided hydronephrosis with ureteral stent in place with also urinary bladder wall thickening possibly related to acute cystitis 1/21-urine culture with multiple organisms Continue Rocephin Urology consulted as above    COPD (chronic obstructive pulmonary disease) (HCC) - On 2 L nasal cannula oxygen chronically - She is at risk for worsening from a COVID perspective given underlying lung disease and her other comorbidities - Currently no signs/symptoms of exacerbation.  No significant wheezing appreciated on exam and CXR is relatively clear - 1/21-was started on inhalers   Continue BreoEllipta    COVID-19 Patient tested positive on 11/16/20 outpatient (she has received both vaccines and booster); no treatment received when positive while at SNF - still positive on admission. While she is here for urinary symptoms, it is concerning that she is lethargy with borderline low O2 sats in the ER; she has underlying COPD on chronic 2L O2; other comorbidities include HTN, PAF, advanced age. UTI is also not fully conclusive as UA negative for bacteria and CT renal study shows stable  -Started on covid tx- decadron/remdesivir for now for at least 24 hours while treating UTI and monitoring clinical response. Can de-escalate covid treatment if this evolves to being urinary only in etiology, however patient has extremely high risk to decompensate rapidly if this is in part covid related and left untreated (especially as patient is full code at this time); Madilyn Fireman (son) does state that they wish to update her MOST form however to DNR and plan to do so when he comes to hospital   1/21-fibrin , crp elevated Will continue with iv remdesivir and steroid for now.    Atrial fibrillation (HCC) Rate controlled Continue Eliquis and Lopressor   Hyperkalemia-mild.  K5.4 We will give low dose Kayexalate Monitor levels  Hypothyroidism - On Synthroid 150 mcg at home - TSH 11.746 on admission which may be also reactive given acute illness.  Free T4 is actually very mildly elevated, 1.18 -1/21-needs repeat TSH in 4 to 6 Solis for further management and wants acute clinical phase improves   Normocytic anemia - Baseline hemoglobin approximately 9 to 10 g/dL.  Currently at baseline - Hemoglobin 10.7 g/dL this morning  Hypertension - Lisinopril on hold in setting of infection and worsened renal function - will treat with as needed medicines as needed  DVT prophylaxis: Eliquis Code Status:DNR, DNI, but wants 02 supplementation, ivabx, feeding per son who reports being the POA and also coming in today to change MOLST Family Communication: Updated son  Status is: Inpatient  Remains inpatient appropriate because:Inpatient level of care appropriate due to severity of illness   Dispo: The patient is from: Home              Anticipated d/c is to: TBD              Anticipated d/c date is: > 3 days              Patient currently is not medically stable to d/c.              LOS: 1 day   Time spent: 45 minutes with more than 50% on [place], MD [facility] Pager [phone]  If 7PM-7AM, please contact night-coverage 12/12/2020, 11:53 AM

## 2020-12-12 NOTE — Progress Notes (Signed)
   12/12/20 1220  Clinical Encounter Type  Visited With Family  Visit Type Initial  Referral From Nurse  Consult/Referral To Chaplain  Advance Directives (For Healthcare)  Does Patient Have a Medical Advance Directive? Yes   As the on call chaplain, I was paged to the visitors desk where I met the patient's son. He wanted to change her MOST document. The patient son states he is the "medical POA." He would like to change her status to DNR. I spoke with my supervisor regarding this matter and we do not facilitate this. He needs to see a physician to make changes.   Chaplain  , Mdiv   

## 2020-12-13 DIAGNOSIS — D649 Anemia, unspecified: Secondary | ICD-10-CM

## 2020-12-13 DIAGNOSIS — N179 Acute kidney failure, unspecified: Secondary | ICD-10-CM | POA: Diagnosis not present

## 2020-12-13 DIAGNOSIS — I10 Essential (primary) hypertension: Secondary | ICD-10-CM | POA: Diagnosis not present

## 2020-12-13 DIAGNOSIS — N3000 Acute cystitis without hematuria: Secondary | ICD-10-CM | POA: Diagnosis not present

## 2020-12-13 DIAGNOSIS — J410 Simple chronic bronchitis: Secondary | ICD-10-CM | POA: Diagnosis not present

## 2020-12-13 LAB — CBC WITH DIFFERENTIAL/PLATELET
Abs Immature Granulocytes: 0.05 10*3/uL (ref 0.00–0.07)
Basophils Absolute: 0 10*3/uL (ref 0.0–0.1)
Basophils Relative: 0 %
Eosinophils Absolute: 0 10*3/uL (ref 0.0–0.5)
Eosinophils Relative: 0 %
HCT: 32.7 % — ABNORMAL LOW (ref 36.0–46.0)
Hemoglobin: 10.4 g/dL — ABNORMAL LOW (ref 12.0–15.0)
Immature Granulocytes: 1 %
Lymphocytes Relative: 13 %
Lymphs Abs: 0.8 10*3/uL (ref 0.7–4.0)
MCH: 29.8 pg (ref 26.0–34.0)
MCHC: 31.8 g/dL (ref 30.0–36.0)
MCV: 93.7 fL (ref 80.0–100.0)
Monocytes Absolute: 0.8 10*3/uL (ref 0.1–1.0)
Monocytes Relative: 12 %
Neutro Abs: 4.9 10*3/uL (ref 1.7–7.7)
Neutrophils Relative %: 74 %
Platelets: 370 10*3/uL (ref 150–400)
RBC: 3.49 MIL/uL — ABNORMAL LOW (ref 3.87–5.11)
RDW: 17.7 % — ABNORMAL HIGH (ref 11.5–15.5)
WBC: 6.6 10*3/uL (ref 4.0–10.5)
nRBC: 0 % (ref 0.0–0.2)

## 2020-12-13 LAB — FIBRIN DERIVATIVES D-DIMER (ARMC ONLY): Fibrin derivatives D-dimer (ARMC): 730.35 ng/mL (FEU) — ABNORMAL HIGH (ref 0.00–499.00)

## 2020-12-13 LAB — PROCALCITONIN: Procalcitonin: 0.63 ng/mL

## 2020-12-13 LAB — PHOSPHORUS: Phosphorus: 4 mg/dL (ref 2.5–4.6)

## 2020-12-13 LAB — COMPREHENSIVE METABOLIC PANEL
ALT: 8 U/L (ref 0–44)
AST: 15 U/L (ref 15–41)
Albumin: 2.1 g/dL — ABNORMAL LOW (ref 3.5–5.0)
Alkaline Phosphatase: 56 U/L (ref 38–126)
Anion gap: 10 (ref 5–15)
BUN: 89 mg/dL — ABNORMAL HIGH (ref 8–23)
CO2: 19 mmol/L — ABNORMAL LOW (ref 22–32)
Calcium: 8.6 mg/dL — ABNORMAL LOW (ref 8.9–10.3)
Chloride: 114 mmol/L — ABNORMAL HIGH (ref 98–111)
Creatinine, Ser: 2.44 mg/dL — ABNORMAL HIGH (ref 0.44–1.00)
GFR, Estimated: 19 mL/min — ABNORMAL LOW (ref >60)
Glucose, Bld: 94 mg/dL (ref 70–99)
Potassium: 5 mmol/L (ref 3.5–5.1)
Sodium: 143 mmol/L (ref 135–145)
Total Bilirubin: 0.5 mg/dL (ref 0.3–1.2)
Total Protein: 6 g/dL — ABNORMAL LOW (ref 6.5–8.1)

## 2020-12-13 LAB — LACTATE DEHYDROGENASE: LDH: 132 U/L (ref 98–192)

## 2020-12-13 LAB — C-REACTIVE PROTEIN: CRP: 7.4 mg/dL — ABNORMAL HIGH (ref <1.0)

## 2020-12-13 LAB — MAGNESIUM: Magnesium: 2.2 mg/dL (ref 1.7–2.4)

## 2020-12-13 MED ORDER — CHLORHEXIDINE GLUCONATE CLOTH 2 % EX PADS
6.0000 | MEDICATED_PAD | Freq: Every day | CUTANEOUS | Status: DC
  Administered 2020-12-13 – 2020-12-20 (×8): 6 via TOPICAL

## 2020-12-13 MED ORDER — METOPROLOL TARTRATE 25 MG PO TABS
12.5000 mg | ORAL_TABLET | Freq: Two times a day (BID) | ORAL | Status: DC
  Administered 2020-12-13 – 2020-12-14 (×2): 12.5 mg via ORAL
  Filled 2020-12-13 (×2): qty 1

## 2020-12-13 NOTE — Progress Notes (Signed)
Urology progress  Creatinine down slightly today to 2.44, 1400 cc of urine output with Foley in place.  Reassess in a.m., if there is no dramatic improvement, will discuss PCN versus stent exchange with healthcare proxy.  Cathy Solis

## 2020-12-13 NOTE — Progress Notes (Signed)
[facility]  ROUNDING NOTE   Subjective:   UOP 1400 Eating better Creatinine 2.44 (2.56)  Objective:  Vital signs in last 24 hours:  Temp:  [96.8 F (36 C)-98 F (36.7 C)] 97.8 F (36.6 C) (01/23 1624) Pulse Rate:  [62-90] 62 (01/23 1624) Resp:  [14-16] 14 (01/23 1624) BP: (88-134)/(50-78) 108/50 (01/23 1624) SpO2:  [90 %-97 %] 92 % (01/23 1624)  Weight change:  Filed Weights   12/10/20 1153  Weight: 43.1 kg    Intake/Output: I/O last 3 completed shifts: In: 689.1 [I.V.:689.1] Out: 1400 [Urine:1400]   Intake/Output this shift:  Total I/O In: 452.7 [IV Piggyback:452.7] Out: 800 [Urine:800]  Physical Exam: General: NAD, laying in bed  Head: Normocephalic, atraumatic. Moist oral mucosal membranes  Eyes: Anicteric, PERRL  Neck: Supple, trachea midline  Lungs:  Clear to auscultation  Heart: Regular rate and rhythm  Abdomen:  Soft, nontender,   Extremities:  no peripheral edema.  Neurologic: Nonfocal, moving all four extremities  Skin: No lesions        Basic Metabolic Panel: Recent Labs  Lab 12/10/20 1156 12/11/20 0449 12/12/20 0532 12/13/20 0627  NA 138 138 143 143  K 4.4 4.8 5.4* 5.0  CL 107 108 112* 114*  CO2 20* 19* 18* 19*  GLUCOSE 103* 95 85 94  BUN 85* 87* 85* 89*  CREATININE 1.96* 2.24* 2.56* 2.44*  CALCIUM 8.9 8.9 8.8* 8.6*  MG 2.2 2.1 2.1 2.2  PHOS 3.8 4.4 5.2* 4.0    Liver Function Tests: Recent Labs  Lab 12/10/20 1156 12/12/20 0532 12/13/20 0627  AST 14* 11* 15  ALT 8 9 8   ALKPHOS 60 62 56  BILITOT 0.8 0.5 0.5  PROT 6.3* 6.3* 6.0*  ALBUMIN 2.5* 2.3* 2.1*   Recent Labs  Lab 12/10/20 1156  LIPASE 26   No results for input(s): AMMONIA in the last 168 hours.  CBC: Recent Labs  Lab 12/10/20 1156 12/11/20 0449 12/12/20 0532 12/13/20 0627  WBC 6.6 6.4 3.8* 6.6  NEUTROABS  --   --  2.6 4.9  HGB 11.1* 10.7* 11.5* 10.4*  HCT 35.2* 33.9* 35.7* 32.7*  MCV 93.6 93.1 92.0 93.7  PLT 397 383 381 370     Cardiac Enzymes: No results for input(s): CKTOTAL, CKMB, CKMBINDEX, TROPONINI in the last 168 hours.  BNP: Invalid input(s): POCBNP  CBG: No results for input(s): GLUCAP in the last 168 hours.  Microbiology: Results for orders placed or performed during the hospital encounter of 12/10/20  SARS CORONAVIRUS 2 (TAT 6-24 HRS) Nasopharyngeal Nasopharyngeal Swab     Status: Abnormal   Collection Time: 12/10/20  9:21 PM   Specimen: Nasopharyngeal Swab  Result Value Ref Range Status   SARS Coronavirus 2 POSITIVE (A) NEGATIVE Final    Comment: (NOTE) SARS-CoV-2 target nucleic acids are DETECTED.  The SARS-CoV-2 RNA is generally detectable in upper and lower respiratory specimens during the acute phase of infection. Positive results are indicative of the presence of SARS-CoV-2 RNA. Clinical correlation with patient history and other diagnostic information is  necessary to determine patient infection status. Positive results do not rule out bacterial infection or co-infection with other viruses.  The expected result is Negative.  Fact Sheet for Patients: SugarRoll.be  Fact Sheet for Healthcare Providers: https://www.woods-mathews.com/  This test is not yet approved or cleared by the [place] FDA and  has been authorized for detection and/or diagnosis of SARS-CoV-2 by FDA under an Emergency Use Authorization (EUA). This EUA will remain  in effect (meaning this test can be used) for the duration of the COVID-19 declaration under Section 564(b)(1) of the Act, 21 U. S.C. section 360bbb-3(b)(1), unless the authorization is terminated or revoked sooner.   Performed at [facility], [place] [address] [place], [place] [address]   Urine Culture     Status: Abnormal   Collection Time: 12/10/20  9:21 PM   Specimen: Urine, Random  Result Value Ref Range Status   Specimen Description   Final    URINE, RANDOM Performed at [facility], [address] [place], [place] [address]    Special Requests   Final    NONE Performed at [facility], [address] [place], [place] [address]    Culture MULTIPLE SPECIES PRESENT, SUGGEST RECOLLECTION (A)  Final   Report Status 12/12/2020 FINAL  Final    Coagulation Studies: No results for input(s): LABPROT, INR in the last 72 hours.  Urinalysis: Recent Labs    12/10/20 2121  COLORURINE YELLOW*  LABSPEC 1.016  PHURINE 7.0  GLUCOSEU NEGATIVE  HGBUR MODERATE*  BILIRUBINUR NEGATIVE  KETONESUR 5*  PROTEINUR >=300*  NITRITE NEGATIVE  LEUKOCYTESUR TRACE*      Imaging: No results found.   Medications:   . sodium chloride 75 mL/hr at 12/13/20 1547  . cefTRIAXone (ROCEPHIN)  IV 1 g (12/13/20 1546)  . remdesivir 100 mg in NS 100 mL 100 mg (12/13/20 1013)   . apixaban  2.5 mg Oral BID  . Chlorhexidine Gluconate Cloth  6 each Topical Daily  . dexamethasone (DECADRON) injection  6 mg Intravenous Daily  . feeding supplement  237 mL Oral BID BM  . fluticasone furoate-vilanterol  1 puff Inhalation Daily  . levothyroxine  150 mcg Oral Daily  . metoprolol tartrate  12.5 mg Oral BID  . multivitamin with minerals  1 tablet Oral Daily   acetaminophen **OR** acetaminophen, albuterol, labetalol, ondansetron **OR** ondansetron (ZOFRAN) IV  Assessment/ Plan:  Cathy Solis is a 80 y.o. white female with hypertension, CVA, history of breast cancer, hypothyroidism, hyperlipidemia, chronic obstructive uropathy, reported right nephrectomy, atrial fibrillation, COPD, who was admitted to [facility] on 12/10/2020 for Acute cystitis with hematuria [N30.01] AKI (acute kidney injury) ([place]) [N17.9] Altered mental status, unspecified altered mental status type [R41.82] COVID-19 [U07.1]  1. Acute kidney injury with hyperkalemia on chronic kidney disease stage IIIA with proteinuria. GFR of 49 on 10/21/2020.  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  Acute kidney injury secondary to prerenal azotemia - Appreciate urology input. Indwelling foley catheter placed  - holding lisinopril.  - Continue IV fluids: NS at 107mL/hr  2. Hypertension:  Holding lisinopril - metoprolol.   2. Urinary tract infection: recent E. Coli infection in 09/2020.  - Continue empiric ceftriaxone.    LOS: 2 [place] 1/23/20224:43 PM

## 2020-12-13 NOTE — Progress Notes (Signed)
Report called to Cristela Blue RN on 1A

## 2020-12-13 NOTE — Progress Notes (Signed)
PROGRESS NOTE    Cathy Solis  QHU:[id number] DOB: 06-05-1941 DOA: 12/10/2020 PCP: Valerie Roys, DO    Brief Narrative:  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  She was unable to provide any collateral information on admission  Last hospitalization was 10/06/2020 until 10/21/2020 for abdominal pain which ultimately resolved on its own.  She had been seen by GI and general surgery during that hospitalization as well.  She was discharged to [facility]. On admission it is noted that COVID PCR was positive.  Her son also stated that she was positive for COVID on 11/16/2020 at [facility].  On work-up in the ER she was found to have UA concerning for possible infection and was started on Rocephin; urine culture was sent. At that time her PCR had not yet resulted.   CT head was unremarkable for acute findings.  Age-related atrophy and chronic small vessel ischemia noted. CT renal stone performed revealing stable but severe left-sided hydronephrosis with left sided Endo ureteral stent in place.  Urinary bladder wall thickening possibly due to cystitis.  And right basilar atelectasis/infiltrate.  CXR also was relatively clear except for the mild linear atelectatic changes appreciated in the right base that were seen on CT as well.  1/22:A/CKD with worsening today cr 2.56 1/23-urology saw pt yesterday, foley placed.   Consultants:     Procedures:   Antimicrobials:   ceftriaxone 1/20>>   Subjective: Pt lying on her side this am has no complaints . Wants to eat breakfast. Per nsg pt was combative earlier, would not allow them to cover her, change her, etc. She was calm for me.   Objective: Vitals:   12/13/20 0428  12/13/20 0838 12/13/20 1026 12/13/20 1157  BP: 134/73 131/61 103/60 (!) 88/59  Pulse: 80 78 76 88  Resp: 16 16  14   Temp: 97.7 F (36.5 C) (!) 96.8 F (36 C)  (!) 97.2 F (36.2 C)  TempSrc: Axillary     SpO2: 97% 90% 92% 93%  Weight:      Height:        Intake/Output Summary (Last 24 hours) at 12/13/2020 1205 Last data filed at 12/13/2020 0845 Gross per 24 hour  Intake --  Output 1000 ml  Net -1000 ml   Filed Weights   12/10/20 1153  Weight: 43.1 kg    Examination: Calm, nad Decrease bs at bases, no wheezing Regular s1/s2 no gallop abd soft benign, +bs No edema Awake, unable to assess neuro.  Mood and affect appropriate in current setting    Data Reviewed: I have personally reviewed following labs and imaging studies  CBC: Recent Labs  Lab 12/10/20 1156 12/11/20 0449 12/12/20 0532 12/13/20 0627  WBC 6.6 6.4 3.8* 6.6  NEUTROABS  --   --  2.6 4.9  HGB 11.1* 10.7* 11.5* 10.4*  HCT 35.2* 33.9* 35.7* 32.7*  MCV 93.6 93.1 92.0 93.7  PLT 397 383 381 465   Basic Metabolic Panel: Recent Labs  Lab 12/10/20 1156 12/11/20 0449 12/12/20 0532 12/13/20 0627  NA 138 138 143 143  K 4.4 4.8 5.4* 5.0  CL 107 108 112* 114*  CO2 20* 19* 18* 19*  GLUCOSE 103* 95 85 94  BUN 85* 87* 85* 89*  CREATININE 1.96* 2.24* 2.56* 2.44*  CALCIUM 8.9 8.9 8.8* 8.6*  MG 2.2 2.1 2.1 2.2  PHOS 3.8 4.4 5.2* 4.0   GFR: Estimated Creatinine Clearance: 12.3 mL/min (A) (by C-G formula based on SCr of 2.44 mg/dL (H)). Liver Function Tests: Recent Labs  Lab 12/10/20 1156 12/12/20 0532 12/13/20 0627  AST 14* 11* 15  ALT 8 9 8   ALKPHOS 60 62 56  BILITOT 0.8 0.5 0.5  PROT 6.3* 6.3* 6.0*  ALBUMIN 2.5* 2.3* 2.1*   Recent Labs  Lab 12/10/20 1156  LIPASE 26   No results for input(s): AMMONIA in the last 168 hours. Coagulation Profile: No results for input(s): INR, PROTIME in the last 168 hours. Cardiac Enzymes: No results for input(s): CKTOTAL, CKMB, CKMBINDEX, TROPONINI in the  last 168 hours. BNP (last 3 results) No results for input(s): PROBNP in the last 8760 hours. HbA1C: No results for input(s): HGBA1C in the last 72 hours. CBG: No results for input(s): GLUCAP in the last 168 hours. Lipid Profile: No results for input(s): CHOL, HDL, LDLCALC, TRIG, CHOLHDL, LDLDIRECT in the last 72 hours. Thyroid Function Tests: Recent Labs    12/11/20 0449 12/11/20 1158  TSH 11.746*  --   FREET4  --  1.18*   Anemia Panel: Recent Labs    12/11/20 0449  VITAMINB12 612   Sepsis Labs: Recent Labs  Lab 12/11/20 1158 12/12/20 0532 12/13/20 0627  PROCALCITON 0.53 1.03 0.63    Recent Results (from the past 240 hour(s))  SARS CORONAVIRUS 2 (TAT 6-24 HRS) Nasopharyngeal Nasopharyngeal Swab     Status: Abnormal   Collection Time: 12/10/20  9:21 PM   Specimen: Nasopharyngeal Swab  Result Value Ref Range Status   SARS Coronavirus 2 POSITIVE (A) NEGATIVE Final    Comment: (NOTE) SARS-CoV-2 target nucleic acids are DETECTED.  The SARS-CoV-2 RNA is generally detectable in upper and lower respiratory specimens during the acute phase of infection. Positive results are indicative of the presence of SARS-CoV-2 RNA. Clinical correlation with patient history and other diagnostic information is  necessary to determine patient infection status. Positive results do not rule out bacterial infection or co-infection with other viruses.  The expected result is Negative.  Fact Sheet for Patients: SugarRoll.be  Fact Sheet for Healthcare Providers: https://www.woods-mathews.com/  This test is not yet approved or cleared by the [place] FDA and  has been authorized for detection and/or diagnosis of SARS-CoV-2 by FDA under an Emergency Use Authorization (EUA). This EUA will remain  in effect (meaning this test can be used) for the duration of the COVID-19 declaration under Section 564(b)(1) of the Act, 21 U. S.C. section  360bbb-3(b)(1), unless the authorization is terminated or revoked sooner.   Performed at [facility], [place] [address] [place], [place] [address]   Urine Culture     Status: Abnormal   Collection Time: 12/10/20  9:21 PM   Specimen: Urine, Random  Result Value Ref Range Status   Specimen Description   Final    URINE, RANDOM Performed at [facility], [address] [place], [place] [address]    Special Requests   Final    NONE Performed at [facility], [address] [place], [place] [address]    Culture MULTIPLE SPECIES PRESENT, SUGGEST RECOLLECTION (A)  Final   Report Status 12/12/2020 FINAL  Final         Radiology Studies: No results found.      Scheduled Meds: . apixaban  2.5 mg  Oral BID  . dexamethasone (DECADRON) injection  6 mg Intravenous Daily  . feeding supplement  237 mL Oral BID BM  . fluticasone furoate-vilanterol  1 puff Inhalation Daily  . levothyroxine  150 mcg Oral Daily  . metoprolol tartrate  12.5 mg Oral BID  . multivitamin with minerals  1 tablet Oral Daily   Continuous Infusions: . sodium chloride 75 mL/hr at 12/13/20 0224  . cefTRIAXone (ROCEPHIN)  IV Stopped (12/12/20 2200)  . remdesivir 100 mg in NS 100 mL 100 mg (12/13/20 1013)    Assessment & Plan:   Principal Problem:   Acute renal failure superimposed on stage 3a chronic kidney disease (HCC) Active Problems:   Hypothyroidism   Hypertension   COPD (chronic obstructive pulmonary disease) (HCC)   Atrial fibrillation (HCC)   COVID-19   Acute cystitis   Normocytic anemia  Acute renal failure superimposed on stage 3a Chronic kidney disease (HCC)  Chronic Lt UPJ obstruction - Baseline creatinine approximately 1.2-1.3.  Creatinine on admission was 1.96 and has further up trended -Ureteral stent initially placed on 10/08/2020 by Dr. Diamantina Providence in the setting of solitary kidney, chronic left UPJ obstruction and UTI. -She was subsequently seen as an  outpatient on 10/2021/21 at which time she was deemed a poor surgical candidate for definitive reconstruction.   1/23-renal insufficiency likely multifactorial including prerenal component and obstruction. Urology input was appreciated.  When measuring renal pelvic diameter pre and post stent the measurements now have improved from 9 cm to 6 cm currently.  This is suggestive that the stent is continuing to function. Urology recommended Foley catheter overnight for maximum urinary drainage along with hydration.If her creatinine fails to improve, they may consider possibility of exchange to percutaneous nephrostomy tube but would try to avoid this. This a.m. creatinine mildly lower at 2.44.  We will continue monitoring levels Nephrology input was appreciated.  Continue to hold lisinopril Continue IV hydration- was hypotensive this a.m. however was given metoprolol    Acute cystitis - History of recent UTI in November 2021 as well as left UPJ obstruction requiring ureteral stent placement on 10/08/2020.   1/23-urine culture with multiple organisms. Clinically symptomatic UTI with suspicious appearing urine likely due to chronic colonization from stent. We will continue empiric antibiotics with ceftriaxone for now-urology agreed with plan   COPD (chronic obstructive pulmonary disease) (HCC) - On 2 L nasal cannula oxygen chronically - She is at risk for worsening from a COVID perspective given underlying lung disease and her other comorbidities 1/23-currently no signs or symptoms of exacerbation.  Chest x-ray relatively clear Continue inhalers Continue Breo Ellipta    COVID-19 Patient tested positive on 11/16/20 outpatient (she has received both vaccines and booster); no treatment received when positive while at SNF - still positive on admission. While she is here for urinary symptoms, it is concerning that she is lethargy with borderline low O2 sats in the ER; she has underlying COPD on  chronic 2L O2; other comorbidities include HTN, PAF, advanced age. UTI is also not fully conclusive as UA negative for bacteria and CT renal study shows stable  -Started on covid tx- decadron/remdesivir for now for at least 24 hours while treating UTI and monitoring clinical response. Can de-escalate covid treatment if this evolves to being urinary only in etiology, however patient has extremely high risk to decompensate rapidly if this is in part covid related and left untreated (especially as patient is full code at this time); [name] (son) does state that they  wish to update her MOST form however to DNR and plan to do so when he comes to hospital  1/23-procalcitonin 0.63.  CRP elevated we will continue checking.  Fibrin derivatives 730 mildly elevated. Continue IV remdesivir and steroids Continue following inflammatory markers     Atrial fibrillation (HCC) Rate controlled Continue Eliquis Metoprolol decreased to 12.5 twice daily since BP on the low side   Hyperkalemia-mild.  K5.4 Was given a dose of Kayexalate, potassium 5.0 today We will continue to monitor   Hypothyroidism - On Synthroid 150 mcg at home - TSH 11.746 on admission which may be also reactive given acute illness.  Free T4 is actually very mildly elevated, 1.18 --needs repeat TSH in 4 to 6 weeks for further management and wants acute clinical phase improves   Normocytic anemia - Baseline hemoglobin approximately 9 to 10 g/dL.  Currently at baseline - Hemoglobin 10.7 g/dL this morning  Hypertension - Lisinopril on hold in setting of infection and worsened renal function - will treat with as needed medicines as needed   DVT prophylaxis: Eliquis Code Status:DNR, DNI, but wants 02 supplementation, ivabx, feeding per son who reports being the POA and also coming in today to change MOLST Family Communication: Updated son  Status is: Inpatient  Remains inpatient appropriate because:Inpatient level of care  appropriate due to severity of illness   Dispo: The patient is from: Home              Anticipated d/c is to: TBD              Anticipated d/c date is: > 3 days              Patient currently is not medically stable to d/c.              LOS: 2 days   Time spent: 35 minutes with more than 50% on COC    Nolberto Hanlon, MD [facility] Pager [phone]  If 7PM-7AM, please contact night-coverage 12/13/2020, 12:05 PM

## 2020-12-13 NOTE — Progress Notes (Signed)
Pt transferred from 1A via hospital bed by [facility]. Pt resting comfortably. Denies pain. Call bell within reach. Bed placed in locked and low position.

## 2020-12-14 ENCOUNTER — Telehealth: Payer: Medicare HMO | Admitting: Family Medicine

## 2020-12-14 DIAGNOSIS — I480 Paroxysmal atrial fibrillation: Secondary | ICD-10-CM | POA: Diagnosis not present

## 2020-12-14 DIAGNOSIS — N1831 Chronic kidney disease, stage 3a: Secondary | ICD-10-CM | POA: Diagnosis not present

## 2020-12-14 DIAGNOSIS — N179 Acute kidney failure, unspecified: Secondary | ICD-10-CM | POA: Diagnosis not present

## 2020-12-14 DIAGNOSIS — N3000 Acute cystitis without hematuria: Secondary | ICD-10-CM | POA: Diagnosis not present

## 2020-12-14 DIAGNOSIS — J410 Simple chronic bronchitis: Secondary | ICD-10-CM | POA: Diagnosis not present

## 2020-12-14 LAB — COMPREHENSIVE METABOLIC PANEL
ALT: 8 U/L (ref 0–44)
AST: 12 U/L — ABNORMAL LOW (ref 15–41)
Albumin: 2.1 g/dL — ABNORMAL LOW (ref 3.5–5.0)
Alkaline Phosphatase: 53 U/L (ref 38–126)
Anion gap: 11 (ref 5–15)
BUN: 91 mg/dL — ABNORMAL HIGH (ref 8–23)
CO2: 18 mmol/L — ABNORMAL LOW (ref 22–32)
Calcium: 8.4 mg/dL — ABNORMAL LOW (ref 8.9–10.3)
Chloride: 116 mmol/L — ABNORMAL HIGH (ref 98–111)
Creatinine, Ser: 2.16 mg/dL — ABNORMAL HIGH (ref 0.44–1.00)
GFR, Estimated: 22 mL/min — ABNORMAL LOW (ref >60)
Glucose, Bld: 107 mg/dL — ABNORMAL HIGH (ref 70–99)
Potassium: 4.3 mmol/L (ref 3.5–5.1)
Sodium: 145 mmol/L (ref 135–145)
Total Bilirubin: 0.4 mg/dL (ref 0.3–1.2)
Total Protein: 5.5 g/dL — ABNORMAL LOW (ref 6.5–8.1)

## 2020-12-14 LAB — CBC WITH DIFFERENTIAL/PLATELET
Abs Immature Granulocytes: 0.08 10*3/uL — ABNORMAL HIGH (ref 0.00–0.07)
Basophils Absolute: 0 10*3/uL (ref 0.0–0.1)
Basophils Relative: 0 %
Eosinophils Absolute: 0 10*3/uL (ref 0.0–0.5)
Eosinophils Relative: 0 %
HCT: 28.1 % — ABNORMAL LOW (ref 36.0–46.0)
Hemoglobin: 9.1 g/dL — ABNORMAL LOW (ref 12.0–15.0)
Immature Granulocytes: 1 %
Lymphocytes Relative: 11 %
Lymphs Abs: 0.7 10*3/uL (ref 0.7–4.0)
MCH: 30.1 pg (ref 26.0–34.0)
MCHC: 32.4 g/dL (ref 30.0–36.0)
MCV: 93 fL (ref 80.0–100.0)
Monocytes Absolute: 0.4 10*3/uL (ref 0.1–1.0)
Monocytes Relative: 6 %
Neutro Abs: 5.3 10*3/uL (ref 1.7–7.7)
Neutrophils Relative %: 82 %
Platelets: 312 10*3/uL (ref 150–400)
RBC: 3.02 MIL/uL — ABNORMAL LOW (ref 3.87–5.11)
RDW: 18 % — ABNORMAL HIGH (ref 11.5–15.5)
WBC: 6.6 10*3/uL (ref 4.0–10.5)
nRBC: 0 % (ref 0.0–0.2)

## 2020-12-14 LAB — C-REACTIVE PROTEIN: CRP: 3.9 mg/dL — ABNORMAL HIGH (ref <1.0)

## 2020-12-14 LAB — PHOSPHORUS: Phosphorus: 3.5 mg/dL (ref 2.5–4.6)

## 2020-12-14 LAB — FIBRIN DERIVATIVES D-DIMER (ARMC ONLY): Fibrin derivatives D-dimer (ARMC): 737.48 ng/mL (FEU) — ABNORMAL HIGH (ref 0.00–499.00)

## 2020-12-14 LAB — MAGNESIUM: Magnesium: 2.1 mg/dL (ref 1.7–2.4)

## 2020-12-14 LAB — FIBRINOGEN: Fibrinogen: 347 mg/dL (ref 210–475)

## 2020-12-14 LAB — LACTATE DEHYDROGENASE: LDH: 121 U/L (ref 98–192)

## 2020-12-14 MED ORDER — MIDODRINE HCL 5 MG PO TABS
2.5000 mg | ORAL_TABLET | Freq: Three times a day (TID) | ORAL | Status: DC
  Administered 2020-12-14 – 2020-12-20 (×17): 2.5 mg via ORAL
  Filled 2020-12-14 (×15): qty 1

## 2020-12-14 MED ORDER — MIDODRINE HCL 5 MG PO TABS: 5.0000 mg | ORAL_TABLET | Freq: Three times a day (TID) | ORAL | Status: DC

## 2020-12-14 NOTE — Progress Notes (Signed)
Urology Inpatient Progress Note  Subjective: No acute events overnight. Creatinine down today, 2.16.  WBC count stable, 6.6.  H&H down today, 9.1 and 28.1, respectively.  Urine culture finalized with multiple species detected, on antibiotics as below.  Foley catheter in place draining pink urine with small clots. Patient reports global pain today, stable.  She denies urinary complaints.  Anti-infectives: Anti-infectives (From admission, onward)   Start     Dose/Rate Route Frequency Ordered Stop   12/12/20 1000  remdesivir 100 mg in sodium chloride 0.9 % 100 mL IVPB       "Followed by" Linked Group Details   100 mg 200 mL/hr over 30 Minutes Intravenous Daily 12/11/20 1047 12/16/20 0959   12/11/20 1200  cefTRIAXone (ROCEPHIN) 1 g in sodium chloride 0.9 % 100 mL IVPB        1 g 200 mL/hr over 30 Minutes Intravenous Every 24 hours 12/10/20 2315     12/11/20 1145  remdesivir 200 mg in sodium chloride 0.9% 250 mL IVPB       "Followed by" Linked Group Details   200 mg 580 mL/hr over 30 Minutes Intravenous Once 12/11/20 1047 12/11/20 1537   12/10/20 2215  cefTRIAXone (ROCEPHIN) 1 g in sodium chloride 0.9 % 100 mL IVPB        1 g 200 mL/hr over 30 Minutes Intravenous  Once 12/10/20 2211 12/11/20 0125      Current Facility-Administered Medications  Medication Dose Route Frequency Provider Last Rate Last Admin  . 0.9 %  sodium chloride infusion   Intravenous Continuous Dwyane Dee, MD 75 mL/hr at 12/14/20 0659 New Bag at 12/14/20 0659  . acetaminophen (TYLENOL) tablet 1,000 mg  1,000 mg Oral Q6H PRN Zada Finders R, MD   1,000 mg at 12/10/20 2341   Or  . acetaminophen (TYLENOL) suppository 650 mg  650 mg Rectal Q6H PRN Zada Finders R, MD      . albuterol (VENTOLIN HFA) 108 (90 Base) MCG/ACT inhaler 2 puff  2 puff Inhalation Q6H PRN Zada Finders R, MD      . apixaban (ELIQUIS) tablet 2.5 mg  2.5 mg Oral BID Zada Finders R, MD   2.5 mg at 12/13/20 2033  . cefTRIAXone (ROCEPHIN) 1 g in  sodium chloride 0.9 % 100 mL IVPB  1 g Intravenous Q24H Lenore Cordia, MD   Stopped at 12/13/20 1616  . Chlorhexidine Gluconate Cloth 2 % PADS 6 each  6 each Topical Daily Nolberto Hanlon, MD   6 each at 12/13/20 1846  . dexamethasone (DECADRON) injection 6 mg  6 mg Intravenous Daily Dwyane Dee, MD   6 mg at 12/13/20 1030  . feeding supplement (ENSURE ENLIVE / ENSURE PLUS) liquid 237 mL  237 mL Oral BID BM Dwyane Dee, MD   237 mL at 12/13/20 1400  . fluticasone furoate-vilanterol (BREO ELLIPTA) 200-25 MCG/INH 1 puff  1 puff Inhalation Daily Lenore Cordia, MD   1 puff at 12/13/20 1037  . labetalol (NORMODYNE) injection 10 mg  10 mg Intravenous Q4H PRN Dwyane Dee, MD      . levothyroxine (SYNTHROID) tablet 150 mcg  150 mcg Oral Daily Zada Finders R, MD   150 mcg at 12/14/20 0700  . metoprolol tartrate (LOPRESSOR) tablet 12.5 mg  12.5 mg Oral BID Nolberto Hanlon, MD   12.5 mg at 12/13/20 2033  . multivitamin with minerals tablet 1 tablet  1 tablet Oral Daily Dwyane Dee, MD   1 tablet at 12/13/20 1029  .  ondansetron (ZOFRAN) tablet 4 mg  4 mg Oral Q6H PRN Lenore Cordia, MD       Or  . ondansetron (ZOFRAN) injection 4 mg  4 mg Intravenous Q6H PRN Lenore Cordia, MD      . remdesivir 100 mg in sodium chloride 0.9 % 100 mL IVPB  100 mg Intravenous Daily Lorna Dibble, RPH   Stopped at 12/13/20 1546   Objective: Vital signs in last 24 hours: Temp:  [97.2 F (36.2 C)-97.8 F (36.6 C)] 97.6 F (36.4 C) (01/24 0514) Pulse Rate:  [62-88] 79 (01/24 0514) Resp:  [14-16] 16 (01/24 0514) BP: (88-143)/(50-90) 110/79 (01/24 0514) SpO2:  [92 %-100 %] 98 % (01/24 0514)  Intake/Output from previous day: 01/23 0701 - 01/24 0700 In: 2790.7 [I.V.:2179.5; IV Piggyback:611.1] Out: 1500 [Urine:1500] Intake/Output this shift: Total I/O In: 16.3 [I.V.:16.3] Out: -   Physical Exam Vitals and nursing note reviewed.  Constitutional:      Appearance: She is cachectic.  HENT:     Head:  Normocephalic and atraumatic.  Pulmonary:     Effort: Pulmonary effort is normal. No respiratory distress.  Skin:    General: Skin is warm and dry.  Neurological:     Mental Status: She is alert.  Psychiatric:        Thought Content: Thought content is paranoid.    Lab Results:  Recent Labs    12/13/20 0627 12/14/20 0403  WBC 6.6 6.6  HGB 10.4* 9.1*  HCT 32.7* 28.1*  PLT 370 312   BMET Recent Labs    12/13/20 0627 12/14/20 0403  NA 143 145  K 5.0 4.3  CL 114* 116*  CO2 19* 18*  GLUCOSE 94 107*  BUN 89* 91*  CREATININE 2.44* 2.16*  CALCIUM 8.6* 8.4*   Assessment & Plan: 80 year old female with solitary left kidney and chronic UPJ obstruction now managed with indwelling stent admitted with acute on chronic renal insufficiency and UTI.  Creatinine downtrending with gentle hydration and maximal urinary decompression with Foley catheter and stent.  With downtrending creatinine today, suspect her acute on chronic renal insufficiency is prerenal in nature.  No indication for urgent urologic intervention today; will defer PCN placement at this time.    Gross hematuria is improving on empiric antibiotics.  Change in H&H likely dilutional, though recommend continued blood monitoring.  Recommendations: -Continue empiric antibiotics for total of 10 to 14 days of therapy -Continue gentle hydration -Daily BMP, CBC -Okay to discontinue Foley catheter tomorrow if creatinine continues to downtrend  Thank you for involving me in this patient's care, urology will sign off at this time.  Debroah Loop, PA-C 12/14/2020

## 2020-12-14 NOTE — TOC Progression Note (Signed)
Transition of Care [facility]) - Progression Note   Patient Details  Name: Cathy Solis MRN: [id number] Date of Birth: Oct 17, 1941  Transition of Care [facility]) CM/SW [place], LCSW Phone Number: 12/14/2020, 1:28 PM  Clinical Narrative:   Patient moved to 1A. CSW called patient's son [name]. [name] confirmed they plan for patient to return home and they have already arranged 24 hour supervision at home. He is aware [facility] will follow for Home Health coverage.   [name] had questions about completing a MOST Form, patient's password, and asked for a call from MD. Updated MD and RN.  Expected Discharge Plan: [place] Barriers to Discharge: Continued Medical Work up  Expected Discharge Plan and Services Expected Discharge Plan: [place]   Discharge Planning Services: CM Consult Post Acute Care Choice: [place] arrangements for the past 2 months: Single Family Home                 DME Arranged: N/A DME Agency: NA       HH Arranged: RN,PT,OT,Nurse's Aide [place]: [facility] [employer] [facility] [employer] Date [place]: 12/11/20 Time [place]: [place] Representative spoke with at [place]: [place] ([place]) Interventions    Readmission Risk Interventions Readmission Risk Prevention Plan 12/11/2020  Transportation Screening Complete  PCP or Specialist Appt within 3-5 Days Complete  HRI or [place] Complete  Social Work Consult for [place] Planning/Counseling Complete  Palliative Care Screening Not Complete  Palliative Care Screening Not Complete Comments could benefit from palliative consult  Medication Review Press photographer) Complete  Some recent data might be hidden

## 2020-12-14 NOTE — Care Management Important Message (Signed)
Important Message  Patient Details  Name: Cathy Solis MRN: [id number] Date of Birth: 1941-08-07   Medicare Important Message Given:  Yes  Reviewed with son via phone due to Airborne Precautions.   [place], LCSW 12/14/2020, 1:26 PM

## 2020-12-14 NOTE — Progress Notes (Signed)
PROGRESS NOTE    Cathy Solis  CHY:[id number] DOB: 12-08-1940 DOA: 12/10/2020 PCP: Valerie Roys, DO    Brief Narrative:  Ms. 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  She was unable to provide any collateral information on admission  Last hospitalization was 10/06/2020 until 10/21/2020 for abdominal pain which ultimately resolved on its own.  She had been seen by GI and general surgery during that hospitalization as well.  She was discharged to [facility]. On admission it is noted that COVID PCR was positive.  Her son also stated that she was positive for COVID on 11/16/2020 at [facility].  On work-up in the ER she was found to have UA concerning for possible infection and was started on Rocephin; urine culture was sent. At that time her PCR had not yet resulted.   CT head was unremarkable for acute findings.  Age-related atrophy and chronic small vessel ischemia noted. CT renal stone performed revealing stable but severe left-sided hydronephrosis with left sided Endo ureteral stent in place.  Urinary bladder wall thickening possibly due to cystitis.  And right basilar atelectasis/infiltrate.  CXR also was relatively clear except for the mild linear atelectatic changes appreciated in the right base that were seen on CT as well.  1/22:A/CKD with worsening today cr 2.56 1/23-urology saw pt yesterday, foley placed.  1/24-afebrile.    Consultants:   Urology  Procedures:   Antimicrobials:   ceftriaxone 1/20>>   Subjective: Lying in bed.  Drinking milk.. Does not respond much to me exam mumbles which I can understand what she is stating   Objective: Vitals:   12/13/20 2033 12/13/20 2127 12/14/20 0042 12/14/20  0514  BP:  (!) 143/74 135/90 110/79  Pulse: 72 81 81 79  Resp:  14 16 16   Temp:  97.7 F (36.5 C) 97.8 F (36.6 C) 97.6 F (36.4 C)  TempSrc:  Oral    SpO2:  100% 100% 98%  Weight:      Height:        Intake/Output Summary (Last 24 hours) at 12/14/2020 0757 Last data filed at 12/14/2020 8786 Gross per 24 hour  Intake 2806.9 ml  Output 1500 ml  Net 1306.9 ml   Filed Weights   12/10/20 1153  Weight: 43.1 kg    Examination: Calm, NAD Decreased breath sounds at bases no wheeze  Regular S1-S2 no gallops Soft benign positive bowel sounds No edema Foley in place Awake and alert.  Unable to do neuro exam Mood and affect appropriate in current setting    Data Reviewed: I have personally reviewed following labs and imaging studies  CBC: Recent Labs  Lab 12/10/20 1156 12/11/20 0449 12/12/20 0532 12/13/20 0627 12/14/20 0403  WBC 6.6 6.4 3.8* 6.6 6.6  NEUTROABS  --   --  2.6 4.9 5.3  HGB 11.1* 10.7* 11.5* 10.4* 9.1*  HCT 35.2* 33.9* 35.7* 32.7* 28.1*  MCV 93.6 93.1 92.0 93.7 93.0  PLT 397 383 381 370 767   Basic Metabolic Panel: Recent Labs  Lab 12/10/20 1156 12/11/20 0449 12/12/20 0532 12/13/20 0627 12/14/20 0403  NA 138 138 143 143 145  K 4.4 4.8 5.4* 5.0 4.3  CL 107 108 112* 114* 116*  CO2 20* 19* 18* 19* 18*  GLUCOSE  103* 95 85 94 107*  BUN 85* 87* 85* 89* 91*  CREATININE 1.96* 2.24* 2.56* 2.44* 2.16*  CALCIUM 8.9 8.9 8.8* 8.6* 8.4*  MG 2.2 2.1 2.1 2.2 2.1  PHOS 3.8 4.4 5.2* 4.0 3.5   GFR: Estimated Creatinine Clearance: 13.9 mL/min (A) (by C-G formula based on SCr of 2.16 mg/dL (H)). Liver Function Tests: Recent Labs  Lab 12/10/20 1156 12/12/20 0532 12/13/20 0627 12/14/20 0403  AST 14* 11* 15 12*  ALT 8 9 8 8   ALKPHOS 60 62 56 53  BILITOT 0.8 0.5 0.5 0.4  PROT 6.3* 6.3* 6.0* 5.5*  ALBUMIN 2.5* 2.3* 2.1* 2.1*   Recent Labs  Lab 12/10/20 1156  LIPASE 26   No results for input(s): AMMONIA in the last 168 hours. Coagulation  Profile: No results for input(s): INR, PROTIME in the last 168 hours. Cardiac Enzymes: No results for input(s): CKTOTAL, CKMB, CKMBINDEX, TROPONINI in the last 168 hours. BNP (last 3 results) No results for input(s): PROBNP in the last 8760 hours. HbA1C: No results for input(s): HGBA1C in the last 72 hours. CBG: No results for input(s): GLUCAP in the last 168 hours. Lipid Profile: No results for input(s): CHOL, HDL, LDLCALC, TRIG, CHOLHDL, LDLDIRECT in the last 72 hours. Thyroid Function Tests: Recent Labs    12/11/20 1158  FREET4 1.18*   Anemia Panel: No results for input(s): VITAMINB12, FOLATE, FERRITIN, TIBC, IRON, RETICCTPCT in the last 72 hours. Sepsis Labs: Recent Labs  Lab 12/11/20 1158 12/12/20 0532 12/13/20 0627  PROCALCITON 0.53 1.03 0.63    Recent Results (from the past 240 hour(s))  SARS CORONAVIRUS 2 (TAT 6-24 HRS) Nasopharyngeal Nasopharyngeal Swab     Status: Abnormal   Collection Time: 12/10/20  9:21 PM   Specimen: Nasopharyngeal Swab  Result Value Ref Range Status   SARS Coronavirus 2 POSITIVE (A) NEGATIVE Final    Comment: (NOTE) SARS-CoV-2 target nucleic acids are DETECTED.  The SARS-CoV-2 RNA is generally detectable in upper and lower respiratory specimens during the acute phase of infection. Positive results are indicative of the presence of SARS-CoV-2 RNA. Clinical correlation with patient history and other diagnostic information is  necessary to determine patient infection status. Positive results do not rule out bacterial infection or co-infection with other viruses.  The expected result is Negative.  Fact Sheet for Patients: SugarRoll.be  Fact Sheet for Healthcare Providers: https://www.woods-mathews.com/  This test is not yet approved or cleared by the [place] FDA and  has been authorized for detection and/or diagnosis of SARS-CoV-2 by FDA under an Emergency Use Authorization (EUA). This EUA  will remain  in effect (meaning this test can be used) for the duration of the COVID-19 declaration under Section 564(b)(1) of the Act, 21 U. S.C. section 360bbb-3(b)(1), unless the authorization is terminated or revoked sooner.   Performed at [facility], [place] [address], [place], [place] [address]   Urine Culture     Status: Abnormal   Collection Time: 12/10/20  9:21 PM   Specimen: Urine, Random  Result Value Ref Range Status   Specimen Description   Final    URINE, RANDOM Performed at [facility], [address], [place], [place] [address]    Special Requests   Final    NONE Performed at [facility], [address] [place], [place] [address]    Culture MULTIPLE SPECIES PRESENT, SUGGEST RECOLLECTION (A)  Final   Report Status 12/12/2020 FINAL  Final  Radiology Studies: No results found.      Scheduled Meds: . apixaban  2.5 mg Oral BID  . Chlorhexidine Gluconate Cloth  6 each Topical Daily  . dexamethasone (DECADRON) injection  6 mg Intravenous Daily  . feeding supplement  237 mL Oral BID BM  . fluticasone furoate-vilanterol  1 puff Inhalation Daily  . levothyroxine  150 mcg Oral Daily  . metoprolol tartrate  12.5 mg Oral BID  . multivitamin with minerals  1 tablet Oral Daily   Continuous Infusions: . sodium chloride 75 mL/hr at 12/14/20 0659  . cefTRIAXone (ROCEPHIN)  IV Stopped (12/13/20 1616)  . remdesivir 100 mg in NS 100 mL Stopped (12/13/20 1546)    Assessment & Plan:   Principal Problem:   Acute renal failure superimposed on stage 3a chronic kidney disease (HCC) Active Problems:   Hypothyroidism   Hypertension   COPD (chronic obstructive pulmonary disease) (HCC)   Atrial fibrillation (HCC)   COVID-19   Acute cystitis   Normocytic anemia  Acute renal failure superimposed on stage 3a Chronic kidney disease (HCC)  Chronic Lt UPJ obstruction - Baseline creatinine approximately 1.2-1.3.  Creatinine on admission  was 1.96 and has further up trended -Ureteral stent initially placed on 10/08/2020 by Dr. Diamantina Providence in the setting of solitary kidney, chronic left UPJ obstruction and UTI. -She was subsequently seen as an outpatient on 10/2021/21 at which time she was deemed a poor surgical candidate for definitive reconstruction.   -renal insufficiency likely multifactorial including prerenal component and obstruction. Urology input was appreciated.  When measuring renal pelvic diameter pre and post stent the measurements now have improved from 9 cm to 6 cm currently.  This is suggestive that the stent is continuing to function. Urology recommended Foley catheter overnight for maximum urinary drainage along with hydration.If her creatinine fails to improve, they may consider possibility of exchange to percutaneous nephrostomy tube but would try to avoid this. 1/24-creatinine improving, 2.16 today Urology recommends continuing empiric antibiotics for total of 10 to 14 days of therapy Okay to discontinue Foley catheter tomorrow if creatinine continues to downtrend With downtrending creatinine today, suspect her acute on chronic renal insufficiency is prerenal in nature.  No indication for urgent urologic intervention today, will defer PCN placement at this time Gross hematuria improving with empiric antibiotics. Nephrology following    Acute cystitis - History of recent UTI in November 2021 as well as left UPJ obstruction requiring ureteral stent placement on 10/08/2020.   1/23-urine culture with multiple organisms. Clinically symptomatic UTI with suspicious appearing urine likely due to chronic colonization from stent. We will continue empiric antibiotics with ceftriaxone for now-urology agreed with plan   COPD (chronic obstructive pulmonary disease) (HCC) - On 2 L nasal cannula oxygen chronically - She is at risk for worsening from a COVID perspective given underlying lung disease and her other  comorbidities 1/24 no symptoms of exacerbation.  Chest x-ray relatively clear Continue inhalers Continue Breo Ellipta Toprol to resumeULT   COVID-19 Patient tested positive on 11/16/20 outpatient (she has received both vaccines and booster); no treatment received when positive while at SNF - still positive on admission. While she is here for urinary symptoms, it is concerning that she is lethargy with borderline low O2 sats in the ER; she has underlying COPD on chronic 2L O2; other comorbidities include HTN, PAF, advanced age. UTI is also not fully conclusive as UA negative for bacteria and CT renal study shows stable  -Started on covid tx- decadron/remdesivir for  now for at least 24 hours while treating UTI and monitoring clinical response. Can de-escalate covid treatment if this evolves to being urinary only in etiology, however patient has extremely high risk to decompensate rapidly if this is in part covid related and left untreated (especially as patient is full code at this time); [name] (son) does state that they wish to update her MOST form however to DNR and plan to do so when he comes to hospital  1/24-CRP decreasing, fibrinogen 347, fibrin derivatives 737.48 Continue IV remdesivir and steroids Continue following inflammatory markers      Atrial fibrillation (HCC) Rate controlled Continue Eliquis Will discontinue beta-blockers since patient blood pressure on low side Add midodrine 2.5 3 times daily  Hyperkalemia- Resolved with dose of Kayexalate K is stable Continue to monitor r   Hypothyroidism - On Synthroid 150 mcg at home - TSH 11.746 on admission which may be also reactive given acute illness.  Free T4 is actually very mildly elevated, 1.18 --needs repeat TSH in 4 to 6 weeks for further management and wants acute clinical phase improves   Normocytic anemia - Baseline hemoglobin approximately 9 to 10 g/dL.  Currently at baseline - Hemoglobin 10.7 g/dL this  morning  Hypertension - Lisinopril on hold in setting of infection and worsened renal function Hold beta-blockers due to low blood pressure   DVT prophylaxis: Eliquis Code Status:DNR, DNI, but wants 02 supplementation, ivabx, feeding per son who reports being the POA and also coming in today to change MOLST Family Communication: Updated son  Status is: Inpatient  Remains inpatient appropriate because:Inpatient level of care appropriate due to severity of illness   Dispo: The patient is from: Home              Anticipated d/c is to: TBD              Anticipated d/c date is:2-3 days              Patient currently is not medically stable to d/c.              LOS: 3 days   Time spent: 35 minutes with more than 50% on [place], MD [facility] Pager [phone]  If 7PM-7AM, please contact night-coverage 12/14/2020, 7:57 AM

## 2020-12-14 NOTE — Telephone Encounter (Signed)
Patient was discharged from [facility] last Wednesday, but she is now in the hospital being treated for COVID and uti symptoms.

## 2020-12-14 NOTE — Progress Notes (Signed)
[facility]  ROUNDING NOTE   Subjective:   UOP 1500  Creatinine 2.16 (2.44) (2.56)  Objective:  Vital signs in last 24 hours:  Temp:  [97.5 F (36.4 C)-97.8 F (36.6 C)] 97.5 F (36.4 C) (01/24 1206) Pulse Rate:  [62-96] 73 (01/24 1206) Resp:  [14-18] 18 (01/24 1206) BP: (108-143)/(50-90) 113/60 (01/24 1206) SpO2:  [92 %-100 %] 95 % (01/24 1206)  Weight change:  Filed Weights   12/10/20 1153  Weight: 43.1 kg    Intake/Output: I/O last 3 completed shifts: In: 2790.7 [I.V.:2179.5; IV Piggyback:611.1] Out: 2100 [Urine:2100]   Intake/Output this shift:  Total I/O In: 16.3 [I.V.:16.3] Out: 475 [Urine:475]  Physical Exam: General: NAD, laying in bed  Head: Normocephalic, atraumatic. Moist oral mucosal membranes  Eyes: Anicteric, PERRL  Neck: Supple, trachea midline  Lungs:  Clear to auscultation  Heart: Regular rate and rhythm  Abdomen:  Soft, nontender,   Extremities:  no peripheral edema.  Neurologic: Nonfocal, moving all four extremities  Skin: No lesions        Basic Metabolic Panel: Recent Labs  Lab 12/10/20 1156 12/11/20 0449 12/12/20 0532 12/13/20 0627 12/14/20 0403  NA 138 138 143 143 145  K 4.4 4.8 5.4* 5.0 4.3  CL 107 108 112* 114* 116*  CO2 20* 19* 18* 19* 18*  GLUCOSE 103* 95 85 94 107*  BUN 85* 87* 85* 89* 91*  CREATININE 1.96* 2.24* 2.56* 2.44* 2.16*  CALCIUM 8.9 8.9 8.8* 8.6* 8.4*  MG 2.2 2.1 2.1 2.2 2.1  PHOS 3.8 4.4 5.2* 4.0 3.5    Liver Function Tests: Recent Labs  Lab 12/10/20 1156 12/12/20 0532 12/13/20 0627 12/14/20 0403  AST 14* 11* 15 12*  ALT 8 9 8 8   ALKPHOS 60 62 56 53  BILITOT 0.8 0.5 0.5 0.4  PROT 6.3* 6.3* 6.0* 5.5*  ALBUMIN 2.5* 2.3* 2.1* 2.1*   Recent Labs  Lab 12/10/20 1156  LIPASE 26   No results for input(s): AMMONIA in the last 168 hours.  CBC: Recent Labs  Lab 12/10/20 1156 12/11/20 0449 12/12/20 0532 12/13/20 0627 12/14/20 0403  WBC 6.6 6.4 3.8* 6.6 6.6  NEUTROABS  --   --   2.6 4.9 5.3  HGB 11.1* 10.7* 11.5* 10.4* 9.1*  HCT 35.2* 33.9* 35.7* 32.7* 28.1*  MCV 93.6 93.1 92.0 93.7 93.0  PLT 397 383 381 370 312    Cardiac Enzymes: No results for input(s): CKTOTAL, CKMB, CKMBINDEX, TROPONINI in the last 168 hours.  BNP: Invalid input(s): POCBNP  CBG: No results for input(s): GLUCAP in the last 168 hours.  Microbiology: Results for orders placed or performed during the hospital encounter of 12/10/20  SARS CORONAVIRUS 2 (TAT 6-24 HRS) Nasopharyngeal Nasopharyngeal Swab     Status: Abnormal   Collection Time: 12/10/20  9:21 PM   Specimen: Nasopharyngeal Swab  Result Value Ref Range Status   SARS Coronavirus 2 POSITIVE (A) NEGATIVE Final    Comment: (NOTE) SARS-CoV-2 target nucleic acids are DETECTED.  The SARS-CoV-2 RNA is generally detectable in upper and lower respiratory specimens during the acute phase of infection. Positive results are indicative of the presence of SARS-CoV-2 RNA. Clinical correlation with patient history and other diagnostic information is  necessary to determine patient infection status. Positive results do not rule out bacterial infection or co-infection with other viruses.  The expected result is Negative.  Fact Sheet for Patients: SugarRoll.be  Fact Sheet for Healthcare Providers: https://www.woods-mathews.com/  This test is not yet approved or cleared  by the [place] and  has been authorized for detection and/or diagnosis of SARS-CoV-2 by FDA under an Emergency Use Authorization (EUA). This EUA will remain  in effect (meaning this test can be used) for the duration of the COVID-19 declaration under Section 564(b)(1) of the Act, 21 U. S.C. section 360bbb-3(b)(1), unless the authorization is terminated or revoked sooner.   Performed at [facility], [place] [address], [place], [place] [address]   Urine Culture     Status: Abnormal   Collection Time: 12/10/20  9:21  PM   Specimen: Urine, Random  Result Value Ref Range Status   Specimen Description   Final    URINE, RANDOM Performed at [facility], [address] [place], [place] [address]    Special Requests   Final    NONE Performed at [facility], [address] [place], [place] [address]    Culture MULTIPLE SPECIES PRESENT, SUGGEST RECOLLECTION (A)  Final   Report Status 12/12/2020 FINAL  Final    Coagulation Studies: No results for input(s): LABPROT, INR in the last 72 hours.  Urinalysis: No results for input(s): COLORURINE, LABSPEC, PHURINE, GLUCOSEU, HGBUR, BILIRUBINUR, KETONESUR, PROTEINUR, UROBILINOGEN, NITRITE, LEUKOCYTESUR in the last 72 hours.  Invalid input(s): APPERANCEUR    Imaging: No results found.   Medications:   . sodium chloride 75 mL/hr at 12/14/20 0659  . cefTRIAXone (ROCEPHIN)  IV Stopped (12/13/20 1616)  . remdesivir 100 mg in NS 100 mL 100 mg (12/14/20 1133)   . apixaban  2.5 mg Oral BID  . Chlorhexidine Gluconate Cloth  6 each Topical Daily  . dexamethasone (DECADRON) injection  6 mg Intravenous Daily  . feeding supplement  237 mL Oral BID BM  . fluticasone furoate-vilanterol  1 puff Inhalation Daily  . levothyroxine  150 mcg Oral Daily  . midodrine  2.5 mg Oral TID WC  . multivitamin with minerals  1 tablet Oral Daily   acetaminophen **OR** acetaminophen, albuterol, ondansetron **OR** ondansetron (ZOFRAN) IV  Assessment/ Plan:  Cathy Solis is a 80 y.o. 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 Acute kidney injury with hyperkalemia on chronic kidney disease stage IIIA with  proteinuria. GFR of 49 on 10/21/2020.  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  Acute kidney injury secondary to prerenal azotemia - Appreciate urology input. Indwelling foley catheter placed  - holding lisinopril.  - Continue IV fluids: NS at 69mL/hr  2. Hypertension:  Holding lisinopril - metoprolol.   2. Urinary tract infection: recent E. Coli infection in 09/2020.  - Continue empiric ceftriaxone.    LOS: 3 Gannon Heinzman 1/24/20222:02 PM

## 2020-12-14 NOTE — Telephone Encounter (Signed)
Patient's son will send a message when patient is getting close to being discharged.

## 2020-12-15 ENCOUNTER — Encounter: Payer: Self-pay | Admitting: Internal Medicine

## 2020-12-15 DIAGNOSIS — J410 Simple chronic bronchitis: Secondary | ICD-10-CM | POA: Diagnosis not present

## 2020-12-15 DIAGNOSIS — Z7189 Other specified counseling: Secondary | ICD-10-CM

## 2020-12-15 DIAGNOSIS — N179 Acute kidney failure, unspecified: Secondary | ICD-10-CM | POA: Diagnosis not present

## 2020-12-15 DIAGNOSIS — Z515 Encounter for palliative care: Secondary | ICD-10-CM

## 2020-12-15 DIAGNOSIS — R4182 Altered mental status, unspecified: Secondary | ICD-10-CM | POA: Diagnosis not present

## 2020-12-15 DIAGNOSIS — I480 Paroxysmal atrial fibrillation: Secondary | ICD-10-CM | POA: Diagnosis not present

## 2020-12-15 DIAGNOSIS — N3000 Acute cystitis without hematuria: Secondary | ICD-10-CM | POA: Diagnosis not present

## 2020-12-15 LAB — COMPREHENSIVE METABOLIC PANEL
ALT: 9 U/L (ref 0–44)
AST: 17 U/L (ref 15–41)
Albumin: 2.1 g/dL — ABNORMAL LOW (ref 3.5–5.0)
Alkaline Phosphatase: 54 U/L (ref 38–126)
Anion gap: 10 (ref 5–15)
BUN: 81 mg/dL — ABNORMAL HIGH (ref 8–23)
CO2: 17 mmol/L — ABNORMAL LOW (ref 22–32)
Calcium: 8.4 mg/dL — ABNORMAL LOW (ref 8.9–10.3)
Chloride: 120 mmol/L — ABNORMAL HIGH (ref 98–111)
Creatinine, Ser: 1.73 mg/dL — ABNORMAL HIGH (ref 0.44–1.00)
GFR, Estimated: 29 mL/min — ABNORMAL LOW (ref >60)
Glucose, Bld: 95 mg/dL (ref 70–99)
Potassium: 4.5 mmol/L (ref 3.5–5.1)
Sodium: 147 mmol/L — ABNORMAL HIGH (ref 135–145)
Total Bilirubin: 0.3 mg/dL (ref 0.3–1.2)
Total Protein: 5.5 g/dL — ABNORMAL LOW (ref 6.5–8.1)

## 2020-12-15 LAB — CBC WITH DIFFERENTIAL/PLATELET
Abs Immature Granulocytes: 0.23 10*3/uL — ABNORMAL HIGH (ref 0.00–0.07)
Basophils Absolute: 0 10*3/uL (ref 0.0–0.1)
Basophils Relative: 0 %
Eosinophils Absolute: 0 10*3/uL (ref 0.0–0.5)
Eosinophils Relative: 0 %
HCT: 31.5 % — ABNORMAL LOW (ref 36.0–46.0)
Hemoglobin: 9.7 g/dL — ABNORMAL LOW (ref 12.0–15.0)
Immature Granulocytes: 3 %
Lymphocytes Relative: 13 %
Lymphs Abs: 1.2 10*3/uL (ref 0.7–4.0)
MCH: 30.3 pg (ref 26.0–34.0)
MCHC: 30.8 g/dL (ref 30.0–36.0)
MCV: 98.4 fL (ref 80.0–100.0)
Monocytes Absolute: 0.5 10*3/uL (ref 0.1–1.0)
Monocytes Relative: 6 %
Neutro Abs: 6.7 10*3/uL (ref 1.7–7.7)
Neutrophils Relative %: 78 %
Platelets: 311 10*3/uL (ref 150–400)
RBC: 3.2 MIL/uL — ABNORMAL LOW (ref 3.87–5.11)
RDW: 18.7 % — ABNORMAL HIGH (ref 11.5–15.5)
WBC: 8.6 10*3/uL (ref 4.0–10.5)
nRBC: 0.2 % (ref 0.0–0.2)

## 2020-12-15 LAB — C-REACTIVE PROTEIN: CRP: 1.9 mg/dL — ABNORMAL HIGH (ref <1.0)

## 2020-12-15 LAB — FIBRIN DERIVATIVES D-DIMER (ARMC ONLY): Fibrin derivatives D-dimer (ARMC): 688.78 ng/mL (FEU) — ABNORMAL HIGH (ref 0.00–499.00)

## 2020-12-15 LAB — FIBRINOGEN: Fibrinogen: 333 mg/dL (ref 210–475)

## 2020-12-15 LAB — PHOSPHORUS: Phosphorus: 2.3 mg/dL — ABNORMAL LOW (ref 2.5–4.6)

## 2020-12-15 LAB — LACTATE DEHYDROGENASE: LDH: 133 U/L (ref 98–192)

## 2020-12-15 LAB — MAGNESIUM: Magnesium: 2.2 mg/dL (ref 1.7–2.4)

## 2020-12-15 MED ORDER — METOPROLOL TARTRATE 25 MG PO TABS
25.0000 mg | ORAL_TABLET | Freq: Two times a day (BID) | ORAL | Status: DC
  Administered 2020-12-15 – 2020-12-20 (×11): 25 mg via ORAL
  Filled 2020-12-15 (×11): qty 1

## 2020-12-15 NOTE — Progress Notes (Addendum)
[facility]) Hospital Liaison RN note:  Received new referral for [facility] out patient palliative program to follow post discharge from [name], NP. Patient information given to referral. [name], TOC is aware. Plan is to discharge home when stable.  Thank you for this referral.  Zandra Abts, RN [facility] Liaison [phone]  ADDENDUM: Patient is a current palliative with [facility] that was followed at [facility]. [name] Liaison will continue to follow for disposition.

## 2020-12-15 NOTE — Progress Notes (Signed)
[facility]  ROUNDING NOTE   Subjective:   UOP 1040mL  Creatinine 1.73 (2.16) (2.44) (2.56)  Objective:  Vital signs in last 24 hours:  Temp:  [97.5 F (36.4 C)-98 F (36.7 C)] 97.5 F (36.4 C) (01/25 0435) Pulse Rate:  [73-102] 91 (01/25 0835) Resp:  [15-18] 16 (01/25 0835) BP: (113-142)/(60-96) 133/96 (01/25 0835) SpO2:  [91 %-100 %] 91 % (01/25 0835)  Weight change:  Filed Weights   12/10/20 1153  Weight: 43.1 kg    Intake/Output: I/O last 3 completed shifts: In: 3676.3 [I.V.:3488.2; IV Piggyback:188.1] Out: 7169 [CVELF:8101]   Intake/Output this shift:  Total I/O In: 480 [P.O.:480] Out: -   Physical Exam: General: NAD, laying in bed  Head: Normocephalic, atraumatic. Moist oral mucosal membranes  Eyes: Anicteric, PERRL  Neck: Supple, trachea midline  Lungs:  Clear to auscultation  Heart: Regular rate and rhythm  Abdomen:  Soft, nontender,   Extremities:  no peripheral edema.  Neurologic: Nonfocal, moving all four extremities  Skin: No lesions        Basic Metabolic Panel: Recent Labs  Lab 12/11/20 0449 12/12/20 0532 12/13/20 0627 12/14/20 0403 12/15/20 0739  NA 138 143 143 145 147*  K 4.8 5.4* 5.0 4.3 4.5  CL 108 112* 114* 116* 120*  CO2 19* 18* 19* 18* 17*  GLUCOSE 95 85 94 107* 95  BUN 87* 85* 89* 91* 81*  CREATININE 2.24* 2.56* 2.44* 2.16* 1.73*  CALCIUM 8.9 8.8* 8.6* 8.4* 8.4*  MG 2.1 2.1 2.2 2.1 2.2  PHOS 4.4 5.2* 4.0 3.5 2.3*    Liver Function Tests: Recent Labs  Lab 12/10/20 1156 12/12/20 0532 12/13/20 0627 12/14/20 0403 12/15/20 0739  AST 14* 11* 15 12* 17  ALT 8 9 8 8 9   ALKPHOS 60 62 56 53 54  BILITOT 0.8 0.5 0.5 0.4 0.3  PROT 6.3* 6.3* 6.0* 5.5* 5.5*  ALBUMIN 2.5* 2.3* 2.1* 2.1* 2.1*   Recent Labs  Lab 12/10/20 1156  LIPASE 26   No results for input(s): AMMONIA in the last 168 hours.  CBC: Recent Labs  Lab 12/11/20 0449 12/12/20 0532 12/13/20 0627 12/14/20 0403 12/15/20 0739  WBC 6.4 3.8*  6.6 6.6 8.6  NEUTROABS  --  2.6 4.9 5.3 6.7  HGB 10.7* 11.5* 10.4* 9.1* 9.7*  HCT 33.9* 35.7* 32.7* 28.1* 31.5*  MCV 93.1 92.0 93.7 93.0 98.4  PLT 383 381 370 312 311    Cardiac Enzymes: No results for input(s): CKTOTAL, CKMB, CKMBINDEX, TROPONINI in the last 168 hours.  BNP: Invalid input(s): POCBNP  CBG: No results for input(s): GLUCAP in the last 168 hours.  Microbiology: Results for orders placed or performed during the hospital encounter of 12/10/20  SARS CORONAVIRUS 2 (TAT 6-24 HRS) Nasopharyngeal Nasopharyngeal Swab     Status: Abnormal   Collection Time: 12/10/20  9:21 PM   Specimen: Nasopharyngeal Swab  Result Value Ref Range Status   SARS Coronavirus 2 POSITIVE (A) NEGATIVE Final    Comment: (NOTE) SARS-CoV-2 target nucleic acids are DETECTED.  The SARS-CoV-2 RNA is generally detectable in upper and lower respiratory specimens during the acute phase of infection. Positive results are indicative of the presence of SARS-CoV-2 RNA. Clinical correlation with patient history and other diagnostic information is  necessary to determine patient infection status. Positive results do not rule out bacterial infection or co-infection with other viruses.  The expected result is Negative.  Fact Sheet for Patients: SugarRoll.be  Fact Sheet for Healthcare Providers: https://www.woods-mathews.com/  This  test is not yet approved or cleared by the [place] and  has been authorized for detection and/or diagnosis of SARS-CoV-2 by FDA under an Emergency Use Authorization (EUA). This EUA will remain  in effect (meaning this test can be used) for the duration of the COVID-19 declaration under Section 564(b)(1) of the Act, 21 U. S.C. section 360bbb-3(b)(1), unless the authorization is terminated or revoked sooner.   Performed at [facility], [place] [address] [place], [place] [address]   Urine Culture     Status: Abnormal    Collection Time: 12/10/20  9:21 PM   Specimen: Urine, Random  Result Value Ref Range Status   Specimen Description   Final    URINE, RANDOM Performed at [facility] [place], [address] [place], [place] [address]    Special Requests   Final    NONE Performed at [facility], [address] [place], [place] [address]    Culture MULTIPLE SPECIES PRESENT, SUGGEST RECOLLECTION (A)  Final   Report Status 12/12/2020 FINAL  Final    Coagulation Studies: No results for input(s): LABPROT, INR in the last 72 hours.  Urinalysis: No results for input(s): COLORURINE, LABSPEC, PHURINE, GLUCOSEU, HGBUR, BILIRUBINUR, KETONESUR, PROTEINUR, UROBILINOGEN, NITRITE, LEUKOCYTESUR in the last 72 hours.  Invalid input(s): APPERANCEUR    Imaging: No results found.   Medications:   . sodium chloride 75 mL/hr at 12/15/20 0637  . cefTRIAXone (ROCEPHIN)  IV Stopped (12/14/20 1555)  . remdesivir 100 mg in NS 100 mL Stopped (12/14/20 1203)   . apixaban  2.5 mg Oral BID  . Chlorhexidine Gluconate Cloth  6 each Topical Daily  . dexamethasone (DECADRON) injection  6 mg Intravenous Daily  . feeding supplement  237 mL Oral BID BM  . fluticasone furoate-vilanterol  1 puff Inhalation Daily  . levothyroxine  150 mcg Oral Daily  . midodrine  2.5 mg Oral TID WC  . multivitamin with minerals  1 tablet Oral Daily   acetaminophen **OR** acetaminophen, albuterol, ondansetron **OR** ondansetron (ZOFRAN) IV  Assessment/ Plan:  [name] is a 80 y.o. 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 GFR of 49 on 10/21/2020.  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  Acute kidney injury secondary to prerenal azotemia - Appreciate urology input. Indwelling foley catheter placed  - holding lisinopril.  - Continue IV fluids: NS at 78mL/hr  2. Hypertension: 133/96 Holding lisinopril - metoprolol.   2. Urinary tract infection: recent E. Coli infection in 09/2020.  - Continue empiric ceftriaxone.    LOS: 4 Cathy Solis 1/25/202210:06 AM

## 2020-12-15 NOTE — Progress Notes (Signed)
[facility]  ROUNDING NOTE   Subjective:   UOP 1037mL  Creatinine 1.73 (2.16) (2.44) (2.56)  Objective:  Vital signs in last 24 hours:  Temp:  [97.5 F (36.4 C)-98 F (36.7 C)] 97.5 F (36.4 C) (01/25 0435) Pulse Rate:  [91-102] 91 (01/25 0835) Resp:  [15-18] 16 (01/25 0835) BP: (126-142)/(72-96) 133/96 (01/25 0835) SpO2:  [91 %-100 %] 91 % (01/25 0835)  Weight change:  Filed Weights   12/10/20 1153  Weight: 43.1 kg    Intake/Output: I/O last 3 completed shifts: In: 3676.3 [I.V.:3488.2; IV Piggyback:188.1] Out: 6195 [KDTOI:7124]   Intake/Output this shift:  Total I/O In: 480 [P.O.:480] Out: -   Physical Exam: General: NAD, laying in bed  Head: Normocephalic, atraumatic. Moist oral mucosal membranes  Eyes: Anicteric, PERRL  Neck: Supple, trachea midline  Lungs:  Clear to auscultation  Heart: Regular rate and rhythm  Abdomen:  Soft, nontender,   Extremities:  no peripheral edema.  Neurologic: Nonfocal, moving all four extremities  Skin: No lesions        Basic Metabolic Panel: Recent Labs  Lab 12/11/20 0449 12/12/20 0532 12/13/20 0627 12/14/20 0403 12/15/20 0739  NA 138 143 143 145 147*  K 4.8 5.4* 5.0 4.3 4.5  CL 108 112* 114* 116* 120*  CO2 19* 18* 19* 18* 17*  GLUCOSE 95 85 94 107* 95  BUN 87* 85* 89* 91* 81*  CREATININE 2.24* 2.56* 2.44* 2.16* 1.73*  CALCIUM 8.9 8.8* 8.6* 8.4* 8.4*  MG 2.1 2.1 2.2 2.1 2.2  PHOS 4.4 5.2* 4.0 3.5 2.3*    Liver Function Tests: Recent Labs  Lab 12/10/20 1156 12/12/20 0532 12/13/20 0627 12/14/20 0403 12/15/20 0739  AST 14* 11* 15 12* 17  ALT 8 9 8 8 9   ALKPHOS 60 62 56 53 54  BILITOT 0.8 0.5 0.5 0.4 0.3  PROT 6.3* 6.3* 6.0* 5.5* 5.5*  ALBUMIN 2.5* 2.3* 2.1* 2.1* 2.1*   Recent Labs  Lab 12/10/20 1156  LIPASE 26   No results for input(s): AMMONIA in the last 168 hours.  CBC: Recent Labs  Lab 12/11/20 0449 12/12/20 0532 12/13/20 0627 12/14/20 0403 12/15/20 0739  WBC 6.4 3.8*  6.6 6.6 8.6  NEUTROABS  --  2.6 4.9 5.3 6.7  HGB 10.7* 11.5* 10.4* 9.1* 9.7*  HCT 33.9* 35.7* 32.7* 28.1* 31.5*  MCV 93.1 92.0 93.7 93.0 98.4  PLT 383 381 370 312 311    Cardiac Enzymes: No results for input(s): CKTOTAL, CKMB, CKMBINDEX, TROPONINI in the last 168 hours.  BNP: Invalid input(s): POCBNP  CBG: No results for input(s): GLUCAP in the last 168 hours.  Microbiology: Results for orders placed or performed during the hospital encounter of 12/10/20  SARS CORONAVIRUS 2 (TAT 6-24 HRS) Nasopharyngeal Nasopharyngeal Swab     Status: Abnormal   Collection Time: 12/10/20  9:21 PM   Specimen: Nasopharyngeal Swab  Result Value Ref Range Status   SARS Coronavirus 2 POSITIVE (A) NEGATIVE Final    Comment: (NOTE) SARS-CoV-2 target nucleic acids are DETECTED.  The SARS-CoV-2 RNA is generally detectable in upper and lower respiratory specimens during the acute phase of infection. Positive results are indicative of the presence of SARS-CoV-2 RNA. Clinical correlation with patient history and other diagnostic information is  necessary to determine patient infection status. Positive results do not rule out bacterial infection or co-infection with other viruses.  The expected result is Negative.  Fact Sheet for Patients: SugarRoll.be  Fact Sheet for Healthcare Providers: https://www.woods-mathews.com/  This  test is not yet approved or cleared by the [place] and  has been authorized for detection and/or diagnosis of SARS-CoV-2 by FDA under an Emergency Use Authorization (EUA). This EUA will remain  in effect (meaning this test can be used) for the duration of the COVID-19 declaration under Section 564(b)(1) of the Act, 21 U. S.C. section 360bbb-3(b)(1), unless the authorization is terminated or revoked sooner.   Performed at [facility], [place] [address], [place], [place] [address]   Urine Culture     Status: Abnormal    Collection Time: 12/10/20  9:21 PM   Specimen: Urine, Random  Result Value Ref Range Status   Specimen Description   Final    URINE, RANDOM Performed at [facility], [address] [place], [place] [address]    Special Requests   Final    NONE Performed at [facility], [address] [place], [place] [address]    Culture MULTIPLE SPECIES PRESENT, SUGGEST RECOLLECTION (A)  Final   Report Status 12/12/2020 FINAL  Final    Coagulation Studies: No results for input(s): LABPROT, INR in the last 72 hours.  Urinalysis: No results for input(s): COLORURINE, LABSPEC, PHURINE, GLUCOSEU, HGBUR, BILIRUBINUR, KETONESUR, PROTEINUR, UROBILINOGEN, NITRITE, LEUKOCYTESUR in the last 72 hours.  Invalid input(s): APPERANCEUR    Imaging: No results found.   Medications:   . sodium chloride 75 mL/hr at 12/15/20 0637  . cefTRIAXone (ROCEPHIN)  IV 1 g (12/15/20 1233)   . apixaban  2.5 mg Oral BID  . Chlorhexidine Gluconate Cloth  6 each Topical Daily  . dexamethasone (DECADRON) injection  6 mg Intravenous Daily  . feeding supplement  237 mL Oral BID BM  . fluticasone furoate-vilanterol  1 puff Inhalation Daily  . levothyroxine  150 mcg Oral Daily  . metoprolol tartrate  25 mg Oral BID  . midodrine  2.5 mg Oral TID WC  . multivitamin with minerals  1 tablet Oral Daily   acetaminophen **OR** acetaminophen, albuterol, ondansetron **OR** ondansetron (ZOFRAN) IV  Assessment/ Plan:  [name] is a 80 y.o. 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 on 12/10/2020 for Acute cystitis with hematuria [N30.01] AKI (acute kidney injury) ([place]) [N17.9] Altered mental status, unspecified altered mental status type [R41.82] COVID-19 [U07.1]  1. Acute kidney injury with hyperkalemia on chronic kidney disease stage  IIIA with proteinuria. GFR of 49 on 10/21/2020.  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  Acute kidney injury secondary to prerenal azotemia - Appreciate urology input. Indwelling foley catheter placed  - holding lisinopril.  - Continue IV fluids: NS at 59mL/hr  2. Hypertension: 133/96 Holding lisinopril - metoprolol.   2. Urinary tract infection: recent E. Coli infection in 09/2020.  - Continue empiric ceftriaxone.    LOS: 4 Syncere Eble 1/25/20222:58 PM

## 2020-12-15 NOTE — Progress Notes (Signed)
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  Last hospitalization was 10/06/2020 until 10/21/2020 for abdominal pain which ultimately resolved on its own.  She had been seen by GI and general surgery during that hospitalization as well.  She was discharged to [facility]. On admission it is noted that COVID PCR was positive.  Her son also stated that she was positive for COVID on 11/16/2020 at [facility].  On work-up in the ER she was found to have UA concerning for possible infection and was started on Rocephin; urine culture was sent. CT head was unremarkable for acute findings.  Age-related atrophy and chronic small vessel ischemia noted. CT renal stone performed revealing stable but severe left-sided hydronephrosis with left sided Endo ureteral stent in place.  Urinary bladder wall thickening possibly due to cystitis.  And right basilar atelectasis/infiltrate.   Urology was consulted for hydronephrosis, placed foley as her creatinine was increasing. Nephrology also consulted for [place]. She had good UO once foley placed. Since creatinine improving , urology recommended d/cing foley and monitor renal function.  Palliative care also consulted-they think pt really needs to be hospice. Tried calling son but no answer.  Son has not spoken to palliative nor has agreed to hospice yet the plan is for her to go home with home health however we need to discuss hospice  1/22:A/CKD with worsening today cr 2.56 1/23-urology saw pt yesterday, foley placed.  1/24-afebrile.  1/25- in fetal position, mumbles in soft voice  when I ask her questions. Unfortunetly I cannot understand her. Urology rec. D/c foley  Consultants:   Urology, nephrology  Procedures:   Antimicrobials:   ceftriaxone 1/20>>   Subjective:    Pt lying in fetal position. Not complaining of anything   Objective: Vitals:   12/14/20 1620 12/14/20 2251 12/15/20 0124 12/15/20 0435  BP: 126/72 126/76 (!) 128/92 (!) 142/85  Pulse: 91 93 93 (!) 102  Resp: 18 16 15 17   Temp: 97.9 F (36.6 C) 97.6 F (36.4 C) 98 F (36.7 C) (!) 97.5 F (36.4 C)  TempSrc:  Oral  Oral  SpO2: 96% 100% 98% 98%  Weight:      Height:        Intake/Output Summary (Last 24 hours) at 12/15/2020 0807 Last data filed at 12/15/2020 [id number] Gross per 24 hour  Intake 1322.15 ml  Output 1075 ml  Net 247.15 ml   Filed Weights   12/10/20 1153  Weight: 43.1 kg    Examination: Calm, comfortable, nad cta no w/r/r Regular s1/s2 no gallops Soft benign +bs No edema Awake, unable to assess neuro.    Data Reviewed: I have personally reviewed following labs and imaging studies  CBC: Recent Labs  Lab 12/10/20 1156 12/11/20 0449 12/12/20 0532 12/13/20 0627 12/14/20 0403  WBC 6.6 6.4 3.8* 6.6 6.6  NEUTROABS  --   --  2.6 4.9 5.3  HGB 11.1* 10.7* 11.5* 10.4* 9.1*  HCT 35.2* 33.9* 35.7* 32.7* 28.1*  MCV 93.6 93.1 92.0 93.7 93.0  PLT 397 383 381 370 [id number]   Basic Metabolic Panel: Recent Labs  Lab 12/10/20 1156 12/11/20  [id number] 12/12/20 0532 12/13/20 0627 12/14/20 0403  NA 138 138 143 143 145  K 4.4 4.8 5.4* 5.0 4.3  CL 107 108 112* 114* 116*  CO2 20* 19* 18* 19* 18*  GLUCOSE 103* 95 85 94 107*  BUN 85* 87* 85* 89* 91*  CREATININE 1.96* 2.24* 2.56* 2.44* 2.16*  CALCIUM 8.9 8.9 8.8* 8.6* 8.4*  MG 2.2 2.1 2.1 2.2 2.1  PHOS 3.8 4.4 5.2* 4.0 3.5   GFR: Estimated Creatinine Clearance: 13.7 mL/min (A) (by C-G formula based on SCr of 2.16 mg/dL (H)). Liver Function Tests: Recent Labs  Lab 12/10/20 1156 12/12/20 0532 12/13/20 0627 12/14/20 0403   AST 14* 11* 15 12*  ALT 8 9 8 8   ALKPHOS 60 62 56 53  BILITOT 0.8 0.5 0.5 0.4  PROT 6.3* 6.3* 6.0* 5.5*  ALBUMIN 2.5* 2.3* 2.1* 2.1*   Recent Labs  Lab 12/10/20 1156  LIPASE 26   No results for input(s): AMMONIA in the last 168 hours. Coagulation Profile: No results for input(s): INR, PROTIME in the last 168 hours. Cardiac Enzymes: No results for input(s): CKTOTAL, CKMB, CKMBINDEX, TROPONINI in the last 168 hours. BNP (last 3 results) No results for input(s): PROBNP in the last 8760 hours. HbA1C: No results for input(s): HGBA1C in the last 72 hours. CBG: No results for input(s): GLUCAP in the last 168 hours. Lipid Profile: No results for input(s): CHOL, HDL, LDLCALC, TRIG, CHOLHDL, LDLDIRECT in the last 72 hours. Thyroid Function Tests: No results for input(s): TSH, T4TOTAL, FREET4, T3FREE, THYROIDAB in the last 72 hours. Anemia Panel: No results for input(s): VITAMINB12, FOLATE, FERRITIN, TIBC, IRON, RETICCTPCT in the last 72 hours. Sepsis Labs: Recent Labs  Lab 12/11/20 1158 12/12/20 0532 12/13/20 0627  PROCALCITON 0.53 1.03 0.63    Recent Results (from the past 240 hour(s))  SARS CORONAVIRUS 2 (TAT 6-24 HRS) Nasopharyngeal Nasopharyngeal Swab     Status: Abnormal   Collection Time: 12/10/20  9:21 PM   Specimen: Nasopharyngeal Swab  Result Value Ref Range Status   SARS Coronavirus 2 POSITIVE (A) NEGATIVE Final    Comment: (NOTE) SARS-CoV-2 target nucleic acids are DETECTED.  The SARS-CoV-2 RNA is generally detectable in upper and lower respiratory specimens during the acute phase of infection. Positive results are indicative of the presence of SARS-CoV-2 RNA. Clinical correlation with patient history and other diagnostic information is  necessary to determine patient infection status. Positive results do not rule out bacterial infection or co-infection with other viruses.  The expected result is Negative.  Fact Sheet for  Patients: SugarRoll.be  Fact Sheet for Healthcare Providers: https://www.woods-mathews.com/  This test is not yet approved or cleared by the [place] FDA and  has been authorized for detection and/or diagnosis of SARS-CoV-2 by FDA under an Emergency Use Authorization (EUA). This EUA will remain  in effect (meaning this test can be used) for the duration of the COVID-19 declaration under Section 564(b)(1) of the Act, 21 U. S.C. section 360bbb-3(b)(1), unless the authorization is terminated or revoked sooner.   Performed at [facility], [place] [address] [place], [place] [address]   Urine Culture     Status: Abnormal   Collection Time: 12/10/20  9:21 PM   Specimen: Urine, Random  Result Value Ref Range Status   Specimen Description   Final    URINE, RANDOM Performed at [facility], [address], [place], [place] [address]    Special Requests   Final    NONE Performed  at [facility], [address] [place], [place] [address]    Culture MULTIPLE SPECIES PRESENT, SUGGEST RECOLLECTION (A)  Final   Report Status 12/12/2020 FINAL  Final         Radiology Studies: No results found.      Scheduled Meds: . apixaban  2.5 mg Oral BID  . Chlorhexidine Gluconate Cloth  6 each Topical Daily  . dexamethasone (DECADRON) injection  6 mg Intravenous Daily  . feeding supplement  237 mL Oral BID BM  . fluticasone furoate-vilanterol  1 puff Inhalation Daily  . levothyroxine  150 mcg Oral Daily  . midodrine  2.5 mg Oral TID WC  . multivitamin with minerals  1 tablet Oral Daily   Continuous Infusions: . sodium chloride 75 mL/hr at 12/15/20 0637  . cefTRIAXone (ROCEPHIN)  IV Stopped (12/14/20 1555)  . remdesivir 100 mg in NS 100 mL Stopped (12/14/20 1203)    Assessment & Plan:   Principal Problem:   Acute renal failure superimposed on stage 3a chronic kidney disease (HCC) Active Problems:    Hypothyroidism   Hypertension   COPD (chronic obstructive pulmonary disease) (HCC)   Atrial fibrillation (HCC)   COVID-19   Acute cystitis   Normocytic anemia  Acute renal failure superimposed on stage 3a Chronic kidney disease (HCC)  Chronic Lt UPJ obstruction - Baseline creatinine approximately 1.2-1.3.  Creatinine on admission was 1.96 and has further up trended -Ureteral stent initially placed on 10/08/2020 by Dr. Diamantina Providence in the setting of solitary kidney, chronic left UPJ obstruction and UTI. -She was subsequently seen as an outpatient on 10/2021/21 at which time she was deemed a poor surgical candidate for definitive reconstruction.   -renal insufficiency likely multifactorial including prerenal component and obstruction. Urology input was appreciated.  When measuring renal pelvic diameter pre and post stent the measurements now have improved from 9 cm to 6 cm currently.  This is suggestive that the stent is continuing to function. Urology recommended Foley catheter overnight for maximum urinary drainage along with hydration.If her creatinine fails to improve, they may consider possibility of exchange to percutaneous nephrostomy tube but would try to avoid this. 1/25-creatinine down to 1.73.  Urology told me to discontinue Foley since her creatinine down .  Will need to monitor renal function if it starts going up will need to replace Foley again Urology recommends continuing empiric antibiotics for total of 10 to 14 days of therapy Suspect her acute on chronic renal insufficiency is prerenal in nature.  No indication for urgent urologic intervention.  Will defer PCN placement at this time. She had gross hematuria which improved with empiric antibiotics. Also nephrology following-holding ACE inhibitor.  Continue IV fluids.    Acute cystitis - History of recent UTI in November 2021 as well as left UPJ obstruction requiring ureteral stent placement on 10/08/2020.   1/-25-urine culture  with multiple organisms however urology recommends treating with them  We will continue with  Clinically symptomatic UTI with suspicious appearing urine likely due to chronic colonization from stent   COPD (chronic obstructive pulmonary disease) (HCC) - On 2 L nasal cannula oxygen chronically - She is at risk for worsening from Covid perspective given her underlying lung disease and her other comorbidities  No symptoms of exacerbation.  Chest x-ray relatively clear  She is on steroid and remdesivir started at admission  Continue inhalers, Breo Ellipta   Atrial fibrillation (HCC) Rate control  Beta-blockers was discontinued since patient's blood pressure was on the low  side and midodrine was added  Since BP has improved will add metoprolol 25 mg twice daily with parameters increase as tolerated and needed Continue Eliquis     COVID-19 Patient tested positive on 11/16/20 outpatient (she has received both vaccines and booster); no treatment received when positive while at SNF - still positive on admission. While she is here for urinary symptoms, it is concerning that she is lethargy with borderline low O2 sats in the ER; she has underlying COPD on chronic 2L O2; other comorbidities include HTN, PAF, advanced age. UTI is also not fully conclusive as UA negative for bacteria and CT renal study shows stable  -Started on covid tx- decadron/remdesivir for now for at least 24 hours while treating UTI and monitoring clinical response. Can de-escalate covid treatment if this evolves to being urinary only in etiology, however patient has extremely high risk to decompensate rapidly if this is in part covid related and left untreated (especially as patient is full code at this time); [name] (son) does state that they wish to update her MOST form however to DNR and plan to do so when he comes to hospital  1/24-CRP decreasing, fibrinogen 347, fibrin derivatives 737.48 Continue IV remdesivir and  steroids Continue following inflammatory markers      Atrial fibrillation (HCC) Rate at times elevated  Continue Eliquis Will discontinue beta-blockers since patient blood pressure on low side Add midodrine 2.5 3 times daily Can resume beta blk increase as tolerated  Hyperkalemia- Resolved with dose of Kayexalate Remains stable    Hypothyroidism - On Synthroid 150 mcg at home - TSH 11.746 on admission which may be also reactive given acute illness.  Free T4 is actually very mildly elevated, 1.18 --needs repeat TSH in 4 to 6 weeks for further management and wants acute clinical phase improves   Normocytic anemia - Baseline hemoglobin approximately 9 to 10 g/dL.  Currently at baseline - Hemoglobin 10.7 g/dL this morning  Hypertension - Lisinopril on hold in setting of infection and worsened renal function Resumed beta blk   DVT prophylaxis: Eliquis Code Status:DNR, DNI, but wants 02 supplementation, ivabx, feeding per son who reports being the POA and also coming in today to change MOLST Family Communication:left vm for son Status is: Inpatient  Remains inpatient appropriate because:Inpatient level of care appropriate due to severity of illness   Dispo: The patient is from: Home              Anticipated d/c is to: TBD              Anticipated d/c date is:2 days              Patient currently is not medically stable to d/c.dc foley and need to monitor renal function. Also trying to d/w son about pallative /hospice.               LOS: 4 days   Time spent: 35 minutes with more than 50% on [place], MD [facility] Pager [phone]  If 7PM-7AM, please contact night-coverage 12/15/2020, 8:07 AM

## 2020-12-15 NOTE — Consult Note (Signed)
Consultation Note Date: 12/15/2020   Patient Name: Cathy Solis  DOB: February 28, 1941  MRN: [id number]  Age / Sex: 80 y.o., female  PCP: Valerie Roys, DO Referring Physician: Nolberto Hanlon, MD  Reason for Consultation: Establishing goals of care and Psychosocial/spiritual support  HPI/Patient Profile: 80 y.o. female  with past medical history of COPD, chronic respiratory failure with hypoxia on 2 L of O2 via [place], A. 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   Clinical Assessment and Goals of Care: Cathy Solis is lying quietly in bed.  She appears acutely/chronically ill and quite frail, cachectic.  Her sister is at bedside.  Cathy Solis will briefly make but not keep eye contact.  She does not answer when I asked her name.  She does communicate otherwise to simple questions.    Several times during our visit, Cathy Solis is weeping.  I asked her why she is crying, but she says, "I do not know".  We talked about medications for her nerves, but she declines these.  Her sister states that she has been doing this off and on for the last day and a half.    Cathy Solis's sister shares that today is her birthday.  She gets her "[place] pad" and pulls up video of Cathy Solis's grandchildren.  Sister states that they already have CNA's hired for 24/7 care.  Call to son, Cathy Solis.  Voicemail message left.  Conference with attending, bedside nursing staff, transition care team related to patient condition, needs, goals of care.  Cathy Solis is to discharge home with home health.  She would clearly benefit from palliative services to talk about future goals.  She would clearly qualify for hospice care at  this point.    Another call to son Cathy Solis.  He shares that although she was eating well, she has been loosing 6-7# every month since mid summer.  She will not take ensure because this is for "old people".  Cathy Solis shares that she has some pride about her health and unwilling to use assistive devices, or items for "old people".    Cathy Solis asks about antidepressants to, "help her heal, if she can heal at all".  We talked about the use of antidepressants in the elderly, that we start low and go slow.  I shared that it may take weeks for any signs of improvement.  He states that her quality of life is horrible at this time.  He shares that her advanced directive speaks to comfort and dignity.  He tells me that at times she can be heard yelling, "the Reita Cliche take me".    We talked about her UTI and the treatment plan.  I encouraged him to keep a watch out for further urinary tract infections.  Patient states that he feels that she has been getting urinary tract infections because she was not  cleaned and changed regularly.  He shares that they will have 24/7 care at home.  We talked about the benefits of hospice care, and residential hospice if appropriate.  At this point he shares that he has heard equally bad things about hospice care.  I shared that if they elect hospice care this can be provided in time.  Updated attending, bedside nursing staff, transition of care team and hospice liaison related to condition, needs, disposition.    HCPOA  NEXT OF KIN -son, Cathy Solis.      SUMMARY OF RECOMMENDATIONS   Home with [facility] Outpatient palliative to follow   Code Status/Advance Care Planning:  DNR  Symptom Management:   Per hospitalist, no additional needs at this time.  Palliative Prophylaxis:   Frequent Pain Assessment, Oral Care and Turn Reposition  Additional Recommendations (Limitations, Scope, Preferences):  at this point treat the treatable, no CPR or intubation    Psycho-social/Spiritual:   Desire for further Chaplaincy support:no  Additional Recommendations: Caregiving  Support/Resources and Education on Hospice  Prognosis:   < 6 weeks, or less would not be surprising based on poor functional status, frailty, poor by mouth intake  Discharge Planning: Home with home health.  Outpatient palliative to follow.  Would benefit from hospice care      Primary Diagnoses: Present on Admission: . Acute renal failure superimposed on stage 3a chronic kidney disease ([place]) . Atrial fibrillation ([place]) . COPD (chronic obstructive pulmonary disease) ([place]) . Hypertension . Hypothyroidism . COVID-19   I have reviewed the medical record, interviewed the patient and family, and examined the patient. The following aspects are pertinent.  Past Medical History:  Diagnosis Date  . Breast cancer [facility]) 2006   Left breast, s/p radiation  . 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 Hypertension   . Kidney problem    Undeveloped R kidney  . 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 Personal history of tobacco use, presenting hazards to health 03/18/2016  . Stroke [facility])    residual left sided weakness  . Toe infection    followed by Dr. Jens Som   Social History   Socioeconomic History  . Marital status: Widowed    Spouse name: Not on file  . Number of children: Not on file  . Years of education: Not on file  . Highest education level: High school graduate  Occupational History  . Not on file  Tobacco Use  . Smoking status: Current Some Day Smoker    Packs/day: 0.50    Years: 30.00    Pack years: 15.00    Types: Cigarettes    Last attempt to quit: 01/08/2019    Years since quitting: 1.9  . Smokeless tobacco: Never Used  Vaping Use  . Vaping Use: Never used  Substance and Sexual Activity  . Alcohol use: No  . Drug use: No  . Sexual activity: Never  Other Topics Concern  . Not on file  Social History Narrative  .  Not on file   Social Determinants of Health   Financial Resource Strain: Low Risk   . Difficulty of Paying Living Expenses: Not hard at all  Food Insecurity: No Food Insecurity  . Worried About Charity fundraiser in the Last Year: Never true  . Ran Out of Food in the Last Year: Never true  Transportation Needs: No Transportation Needs  . Lack of Transportation (Medical): No  . Lack of Transportation (Non-Medical): No  Physical Activity: Inactive  . Days of Exercise per Week: 0 days  . Minutes of Exercise per Session: 0 min  Stress: No Stress Concern Present  . Feeling of Stress : Not at all  Social Connections: Moderately Isolated  . Frequency of Communication with Friends and Family: More than three times a week  . Frequency of Social Gatherings with Friends and Family: More than three times a week  . Attends Religious Services: More than 4 times per year  . Active Member of Clubs or Organizations: No  . Attends Archivist Meetings: Never  . Marital Status: Widowed   Family History  Problem Relation Age of Onset  . Stroke Mother   . Heart disease Father   . Heart attack Maternal Grandfather    Scheduled Meds: . apixaban  2.5 mg Oral BID  . Chlorhexidine Gluconate Cloth  6 each Topical Daily  . dexamethasone (DECADRON) injection  6 mg Intravenous Daily  . feeding supplement  237 mL Oral BID BM  . fluticasone furoate-vilanterol  1 puff Inhalation Daily  . levothyroxine  150 mcg Oral Daily  . midodrine  2.5 mg Oral TID WC  . multivitamin with minerals  1 tablet Oral Daily   Continuous Infusions: . sodium chloride 75 mL/hr at 12/15/20 0637  . cefTRIAXone (ROCEPHIN)  IV 1 g (12/15/20 1233)   PRN Meds:.acetaminophen **OR** acetaminophen, albuterol, ondansetron **OR** ondansetron (ZOFRAN) IV Medications Prior to Admission:  Prior to Admission medications   Medication Sig Start Date End Date Taking? Authorizing Provider  acetaminophen (TYLENOL) 325 MG tablet Take  2 tablets (650 mg total) by mouth every 6 (six) hours as needed for mild pain (or Fever >/= 101). 10/05/20  Yes Jennye Boroughs, MD  apixaban (ELIQUIS) 2.5 MG TABS tablet Take 1 tablet (2.5 mg total) by mouth 2 (two) times daily. 10/05/20  Yes Jennye Boroughs, MD  levothyroxine (SYNTHROID) 150 MCG tablet Take 1 tablet (150 mcg total) by mouth daily. 09/06/20  Yes Johnson, Megan P, DO  lisinopril (ZESTRIL) 10 MG tablet Take 1 tablet (10 mg total) by mouth daily. TAKE 1 TABLET BY MOUTH EVERY DAY 07/21/20  Yes Johnson, Megan P, DO  magnesium oxide (MAG-OX) 400 (241.3 Mg) MG tablet Take 1 tablet (400 mg total) by mouth 2 (two) times daily. 10/21/20  Yes Pokhrel, Laxman, MD  metoprolol tartrate (LOPRESSOR) 50 MG tablet Take 1 tablet (50 mg total) by mouth 2 (two) times daily. 10/05/20  Yes Jennye Boroughs, MD  ondansetron (ZOFRAN) 4 MG tablet Take 1 tablet (4 mg total) by mouth every 8 (eight) hours as needed for nausea or vomiting. 10/21/20  Yes Pokhrel, Laxman, MD  pantoprazole (PROTONIX) 40 MG tablet Take 40 mg by mouth daily.   Yes [provider]   Allergies  Allergen Reactions  . Statins Other (See Comments)    Myalgias  . Zetia [Ezetimibe] Other (See Comments)    myalgias   Review of Systems  Unable to perform ROS: Age    Physical Exam Vitals and nursing note reviewed.  Constitutional:      Appearance: She is ill-appearing.     Comments: Alert but will not make eye contact.  Appears cachectic, frail  HENT:     Head: Normocephalic and atraumatic.     Comments: Temporal wasting Cardiovascular:     Rate and Rhythm: Normal rate.  Pulmonary:     Effort: No respiratory distress.  Abdominal:     General: Abdomen is flat.  Musculoskeletal:  Comments: Cachectic  Skin:    General: Skin is warm and dry.  Neurological:     Mental Status: She is alert.     Comments: Does not respond when asked her name     Vital Signs: BP (!) 133/96 (BP Location: Left Arm)   Pulse 91   Temp  (!) 97.5 F (36.4 C) (Oral)   Resp 16   Ht 5\' 8"  (1.727 m)   Wt 43.1 kg   SpO2 91%   BMI 14.44 kg/m  Pain Scale: PAINAD   Pain Score: 0-No pain   SpO2: SpO2: 91 % O2 Device:SpO2: 91 % O2 Flow Rate: .O2 Flow Rate (L/min): 2 L/min  IO: Intake/output summary:   Intake/Output Summary (Last 24 hours) at 12/15/2020 1237 Last data filed at 12/15/2020 [id number] Gross per 24 hour  Intake 1802.15 ml  Output 600 ml  Net 1202.15 ml    LBM: Last BM Date: 12/14/20 Baseline Weight: Weight: 43.1 kg Most recent weight: Weight: 43.1 kg     Palliative Assessment/Data:   Flowsheet Rows   Flowsheet Row Most Recent Value  Intake Tab   Referral Department Hospitalist  Unit at Time of Referral Med/Surg Unit  Palliative Care Primary Diagnosis Sepsis/Infectious Disease  Date Notified 12/12/20  Palliative Care Type Return patient Palliative Care  Reason for referral Clarify Goals of Care  Date of Admission 12/10/20  Date first seen by Palliative Care 12/15/20  # of days Palliative referral response time 3 Day(s)  # of days IP prior to Palliative referral 2  Clinical Assessment   Palliative Performance Scale Score 20%  Pain Max last 24 hours Not able to report  Pain Min Last 24 hours Not able to report  Dyspnea Max Last 24 Hours Not able to report  Dyspnea Min Last 24 hours Not able to report  Psychosocial & Spiritual Assessment   Palliative Care Outcomes       Time In: 1030 Time Out: 1150 Time Total: 80 minutes  Greater than 50%  of this time was spent counseling and coordinating care related to the above assessment and plan.  Signed by: Drue Novel, NP   Please contact Palliative Medicine Team phone at [phone] for questions and concerns.  For individual provider: See Shea Evans

## 2020-12-16 DIAGNOSIS — N179 Acute kidney failure, unspecified: Secondary | ICD-10-CM | POA: Diagnosis not present

## 2020-12-16 DIAGNOSIS — J410 Simple chronic bronchitis: Secondary | ICD-10-CM | POA: Diagnosis not present

## 2020-12-16 DIAGNOSIS — U071 COVID-19: Secondary | ICD-10-CM | POA: Diagnosis not present

## 2020-12-16 DIAGNOSIS — N3000 Acute cystitis without hematuria: Secondary | ICD-10-CM | POA: Diagnosis not present

## 2020-12-16 LAB — CBC WITH DIFFERENTIAL/PLATELET
Abs Immature Granulocytes: 0.18 10*3/uL — ABNORMAL HIGH (ref 0.00–0.07)
Basophils Absolute: 0 10*3/uL (ref 0.0–0.1)
Basophils Relative: 0 %
Eosinophils Absolute: 0 10*3/uL (ref 0.0–0.5)
Eosinophils Relative: 0 %
HCT: 31.5 % — ABNORMAL LOW (ref 36.0–46.0)
Hemoglobin: 9.7 g/dL — ABNORMAL LOW (ref 12.0–15.0)
Immature Granulocytes: 2 %
Lymphocytes Relative: 12 %
Lymphs Abs: 1.2 10*3/uL (ref 0.7–4.0)
MCH: 30 pg (ref 26.0–34.0)
MCHC: 30.8 g/dL (ref 30.0–36.0)
MCV: 97.5 fL (ref 80.0–100.0)
Monocytes Absolute: 0.6 10*3/uL (ref 0.1–1.0)
Monocytes Relative: 6 %
Neutro Abs: 8 10*3/uL — ABNORMAL HIGH (ref 1.7–7.7)
Neutrophils Relative %: 80 %
Platelets: 279 10*3/uL (ref 150–400)
RBC: 3.23 MIL/uL — ABNORMAL LOW (ref 3.87–5.11)
RDW: 19 % — ABNORMAL HIGH (ref 11.5–15.5)
WBC: 9.9 10*3/uL (ref 4.0–10.5)
nRBC: 0 % (ref 0.0–0.2)

## 2020-12-16 LAB — COMPREHENSIVE METABOLIC PANEL
ALT: 10 U/L (ref 0–44)
AST: 16 U/L (ref 15–41)
Albumin: 2.2 g/dL — ABNORMAL LOW (ref 3.5–5.0)
Alkaline Phosphatase: 56 U/L (ref 38–126)
Anion gap: 9 (ref 5–15)
BUN: 67 mg/dL — ABNORMAL HIGH (ref 8–23)
CO2: 19 mmol/L — ABNORMAL LOW (ref 22–32)
Calcium: 8.6 mg/dL — ABNORMAL LOW (ref 8.9–10.3)
Chloride: 120 mmol/L — ABNORMAL HIGH (ref 98–111)
Creatinine, Ser: 1.53 mg/dL — ABNORMAL HIGH (ref 0.44–1.00)
GFR, Estimated: 34 mL/min — ABNORMAL LOW (ref >60)
Glucose, Bld: 79 mg/dL (ref 70–99)
Potassium: 4.7 mmol/L (ref 3.5–5.1)
Sodium: 148 mmol/L — ABNORMAL HIGH (ref 135–145)
Total Bilirubin: 0.3 mg/dL (ref 0.3–1.2)
Total Protein: 5.3 g/dL — ABNORMAL LOW (ref 6.5–8.1)

## 2020-12-16 LAB — MAGNESIUM: Magnesium: 2.3 mg/dL (ref 1.7–2.4)

## 2020-12-16 LAB — PHOSPHORUS: Phosphorus: 2.4 mg/dL — ABNORMAL LOW (ref 2.5–4.6)

## 2020-12-16 LAB — FIBRINOGEN: Fibrinogen: 347 mg/dL (ref 210–475)

## 2020-12-16 LAB — FIBRIN DERIVATIVES D-DIMER (ARMC ONLY): Fibrin derivatives D-dimer (ARMC): 943.53 ng/mL (FEU) — ABNORMAL HIGH (ref 0.00–499.00)

## 2020-12-16 LAB — LACTATE DEHYDROGENASE: LDH: 156 U/L (ref 98–192)

## 2020-12-16 LAB — C-REACTIVE PROTEIN: CRP: 1.3 mg/dL — ABNORMAL HIGH (ref <1.0)

## 2020-12-16 NOTE — Progress Notes (Signed)
[facility]  ROUNDING NOTE   Subjective:   Sister at bedside. Patient eating better. Tearful this morning.   Objective:  Vital signs in last 24 hours:  Temp:  [97.8 F (36.6 C)-98.2 F (36.8 C)] 98.2 F (36.8 C) (01/26 1143) Pulse Rate:  [53-85] 53 (01/26 1143) Resp:  [15-17] 17 (01/26 1143) BP: (110-137)/(56-99) 110/56 (01/26 1143) SpO2:  [92 %-100 %] 100 % (01/26 1143)  Weight change:  Filed Weights   12/10/20 1153  Weight: 43.1 kg    Intake/Output: I/O last 3 completed shifts: In: 1802.2 [P.O.:480; I.V.:1292.4; IV Piggyback:29.7] Out: 600 [Urine:600]   Intake/Output this shift:  No intake/output data recorded.  Physical Exam: General: NAD, laying in bed  Head: Normocephalic, atraumatic. Moist oral mucosal membranes  Eyes: Anicteric, PERRL  Neck: Supple, trachea midline  Lungs:  Clear to auscultation  Heart: Regular rate and rhythm  Abdomen:  Soft, nontender,   Extremities:  no peripheral edema.  Neurologic: Nonfocal, moving all four extremities  Skin: No lesions        Basic Metabolic Panel: Recent Labs  Lab 12/12/20 0532 12/13/20 0627 12/14/20 0403 12/15/20 0739 12/16/20 0643  NA 143 143 145 147* 148*  K 5.4* 5.0 4.3 4.5 4.7  CL 112* 114* 116* 120* 120*  CO2 18* 19* 18* 17* 19*  GLUCOSE 85 94 107* 95 79  BUN 85* 89* 91* 81* 67*  CREATININE 2.56* 2.44* 2.16* 1.73* 1.53*  CALCIUM 8.8* 8.6* 8.4* 8.4* 8.6*  MG 2.1 2.2 2.1 2.2 2.3  PHOS 5.2* 4.0 3.5 2.3* 2.4*    Liver Function Tests: Recent Labs  Lab 12/12/20 0532 12/13/20 0627 12/14/20 0403 12/15/20 0739 12/16/20 0643  AST 11* 15 12* 17 16  ALT 9 8 8 9 10   ALKPHOS 62 56 53 54 56  BILITOT 0.5 0.5 0.4 0.3 0.3  PROT 6.3* 6.0* 5.5* 5.5* 5.3*  ALBUMIN 2.3* 2.1* 2.1* 2.1* 2.2*   Recent Labs  Lab 12/10/20 1156  LIPASE 26   No results for input(s): AMMONIA in the last 168 hours.  CBC: Recent Labs  Lab 12/12/20 0532 12/13/20 0627 12/14/20 0403 12/15/20 0739  12/16/20 0643  WBC 3.8* 6.6 6.6 8.6 9.9  NEUTROABS 2.6 4.9 5.3 6.7 8.0*  HGB 11.5* 10.4* 9.1* 9.7* 9.7*  HCT 35.7* 32.7* 28.1* 31.5* 31.5*  MCV 92.0 93.7 93.0 98.4 97.5  PLT 381 370 312 311 279    Cardiac Enzymes: No results for input(s): CKTOTAL, CKMB, CKMBINDEX, TROPONINI in the last 168 hours.  BNP: Invalid input(s): POCBNP  CBG: No results for input(s): GLUCAP in the last 168 hours.  Microbiology: Results for orders placed or performed during the hospital encounter of 12/10/20  SARS CORONAVIRUS 2 (TAT 6-24 HRS) Nasopharyngeal Nasopharyngeal Swab     Status: Abnormal   Collection Time: 12/10/20  9:21 PM   Specimen: Nasopharyngeal Swab  Result Value Ref Range Status   SARS Coronavirus 2 POSITIVE (A) NEGATIVE Final    Comment: (NOTE) SARS-CoV-2 target nucleic acids are DETECTED.  The SARS-CoV-2 RNA is generally detectable in upper and lower respiratory specimens during the acute phase of infection. Positive results are indicative of the presence of SARS-CoV-2 RNA. Clinical correlation with patient history and other diagnostic information is  necessary to determine patient infection status. Positive results do not rule out bacterial infection or co-infection with other viruses.  The expected result is Negative.  Fact Sheet for Patients: SugarRoll.be  Fact Sheet for Healthcare Providers: https://www.woods-mathews.com/  This test is not  yet approved or cleared by the [place] and  has been authorized for detection and/or diagnosis of SARS-CoV-2 by FDA under an Emergency Use Authorization (EUA). This EUA will remain  in effect (meaning this test can be used) for the duration of the COVID-19 declaration under Section 564(b)(1) of the Act, 21 U. S.C. section 360bbb-3(b)(1), unless the authorization is terminated or revoked sooner.   Performed at [facility], [place] [address], [place], [place] [address]   Urine  Culture     Status: Abnormal   Collection Time: 12/10/20  9:21 PM   Specimen: Urine, Random  Result Value Ref Range Status   Specimen Description   Final    URINE, RANDOM Performed at [facility], [address], [place], [place] [address]    Special Requests   Final    NONE Performed at [facility], [address] [place], [place] [address]    Culture MULTIPLE SPECIES PRESENT, SUGGEST RECOLLECTION (A)  Final   Report Status 12/12/2020 FINAL  Final    Coagulation Studies: No results for input(s): LABPROT, INR in the last 72 hours.  Urinalysis: No results for input(s): COLORURINE, LABSPEC, PHURINE, GLUCOSEU, HGBUR, BILIRUBINUR, KETONESUR, PROTEINUR, UROBILINOGEN, NITRITE, LEUKOCYTESUR in the last 72 hours.  Invalid input(s): APPERANCEUR    Imaging: No results found.   Medications:   . sodium chloride 75 mL/hr at 12/16/20 0624  . cefTRIAXone (ROCEPHIN)  IV 1 g (12/16/20 1302)   . apixaban  2.5 mg Oral BID  . Chlorhexidine Gluconate Cloth  6 each Topical Daily  . dexamethasone (DECADRON) injection  6 mg Intravenous Daily  . feeding supplement  237 mL Oral BID BM  . fluticasone furoate-vilanterol  1 puff Inhalation Daily  . levothyroxine  150 mcg Oral Daily  . metoprolol tartrate  25 mg Oral BID  . midodrine  2.5 mg Oral TID WC  . multivitamin with minerals  1 tablet Oral Daily   acetaminophen **OR** acetaminophen, albuterol, ondansetron **OR** ondansetron (ZOFRAN) IV  Assessment/ Plan:  Cathy Solis is a 80 y.o. 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 Acute kidney injury with hyperkalemia on  chronic kidney disease stage IIIA with proteinuria. GFR of 49 on 10/21/2020.  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  Acute kidney injury secondary to prerenal azotemia - Appreciate urology input. Indwelling foley catheter placed  - holding lisinopril.  - Continue IV fluids: NS at 59mL/hr  2. Hypertension:   Holding lisinopril - metoprolol.   2. Urinary tract infection: recent E. Coli infection in 09/2020.  - Continue empiric ceftriaxone.   Will sign off. Please call with questions.    LOS: 5 Amai Cappiello 1/26/20223:08 PM

## 2020-12-16 NOTE — Progress Notes (Signed)
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  She was unable to provide any collateral information on admission  Last hospitalization was 10/06/2020 until 10/21/2020 for abdominal pain which ultimately resolved on its own.  She had been seen by GI and general surgery during that hospitalization as well.  She was discharged to [facility]. On admission it is noted that COVID PCR was positive.  Her son also stated that she was positive for COVID on 11/16/2020 at [facility].  On work-up in the ER she was found to have UA concerning for possible infection and was started on Rocephin; urine culture was sent. At that time her PCR had not yet resulted.  CT head was unremarkable for acute findings.  Age-related atrophy and chronic small vessel ischemia noted. CT renal stone performed revealing stable but severe left-sided hydronephrosis with left sided Endo ureteral stent in place.  Urinary bladder wall thickening possibly due to cystitis.  And right basilar atelectasis/infiltrate.  CXR also was relatively clear except for the mild linear atelectatic changes appreciated in the right base that were seen on CT as well.  She was admitted for antibiotics, fluid resuscitation, and treatment for COVID-19 infection. She was started on Decadron and remdesivir.     Assessment & Plan   Principal Problem:   Acute renal failure superimposed on stage 3a chronic kidney disease (HCC) Active Problems:   Hypothyroidism   Hypertension   COPD (chronic obstructive pulmonary disease) (HCC)   Pressure injury of  skin   Atrial fibrillation (HCC)   COVID-19   Acute cystitis   Normocytic anemia   AKI superimposed on CKD stage 3a -POA, improving.  Due to obstructive uropathy as outlined below in addition to prerenal azotemia. Baseline creatinine approximately 1.2-1.3, presented with creatinine 1.96.  --Hold ACE inhibitor --Continue IV fluids   Chronic Lt UPJ obstruction - Ureteral stent initially placed on 10/08/2020 by Dr. Diamantina Providence in the setting of solitary kidney, chronic left UPJ obstruction and UTI.  Subsequently seen as an outpatient on 10/2021/21, and was deemed a poor surgical candidate for definitive reconstruction. Renal pelvic diameter pre and post stent have improved from 9 cm to 6 cm, suggestive that stent is functional. --Urology consulted, input appreciated --Urology recommended Foley to maximize bladder emptying in addition to hydration --125 urology recommended DC Foley as creatinine was trending down.  Foley should be replaced if creatinine again trends upward. --Per urology, empiric antibiotics for total 10 to 14 days -on Rocephin   Acute cystitis - Hx of recent UTI in November 2021, and left UPJ obstruction requiring ureteral stent placement on 10/08/2020.  Urine culture obtained on admission grew multiple species. --Continue empiric antibiotics per urology as above --Due to chronic stent, patient likely has colonization, monitor for symptoms closely  Chronic respiratory failure with hypoxia due to COPD -uses 2 L/min nasal cannula oxygen at baseline. COPD not currently exacerbated, no wheezing on exam.  Chest x-ray relatively clear. --Continue steroid as below --Continue home regimen for COPD --Monitor respiratory status closely --Supplemental oxygen to maintain O2 sat 88 to 93%  Atrial fibrillation - Rate controlled.  Beta-blocker was previously stopped due to hypotension, and patient was started on midodrine.  BP improved this admission, metoprolol has been added with  hold parameters. --Continue Eliquis and metoprolol --Monitor heart rate and blood pressure closely   COVID-19 infection -tested positive on 11/16/2020.  Patient is vaccinated and received a booster.  She tested positive at SNF but was not treated with anything.  Still positive on admission here.  Due to lethargy, borderline low oxygen saturation, underlying COPD and hypoxia in addition to other comorbidities, patient felt very high risk for progression to severe illness.  She was started on treatment for Covid with dexamethasone and remdesivir --Completed remdesivir --Continue dexamethasone --COPD treatment as above --Monitor respiratory status   Hyperkalemia - Resolved with dose of Kayexalate. Remains stable -- Monitor BMP   Hypothyroidism - takes Synthroid 150 mcg at home.  TSH 11.746 on admission which may be also reactive given acute illness. Free T4 is actually very mildly elevated, 1.18 --needs repeat TSH in 4 to 6 weeks for further management and wants acute clinical phase improves --continue current dose Synthroid   Normocytic anemia - Baseline hemoglobin ~9 to 10 g/dL. Stable and at baseline. --Monitor CBC.     Hypertension - lisinopril on hold due to AKI.  Beta blocker resumed.   --Monitor BP.   Severe protein calorie malnutrition / Underweight -  Patient BMI: Body mass index is 14.44 kg/m.  Palliative care consulted.   DVT prophylaxis: apixaban (ELIQUIS) tablet 2.5 mg Start: 12/10/20 2315 apixaban (ELIQUIS) tablet 2.5 mg   Diet:  Diet Orders (From admission, onward)    Start     Ordered   12/11/20 1813  DIET - DYS 1 Room service appropriate? Yes; Fluid consistency: Thin  Diet effective now       Question Answer Comment  Room service appropriate? Yes   Fluid consistency: Thin      12/11/20 1812            Code Status: DNR    Subjective 12/16/20    Pt sleeping but awoke to voice when seen today.  She says nothing bothering her currently.  Denies  pain, nausea vomiting, shortness of breath, fevers chills.  Closes her eyes again and no longer responds to my questions.  Disposition Plan & Communication   Status is: Inpatient  Remains inpatient appropriate because:IV treatments appropriate due to intensity of illness or inability to take PO   Dispo: The patient is from: Home              Anticipated d/c is to: Home              Anticipated d/c date is: 2 days              Patient currently is not medically stable to d/c.   Difficult to place patient No  Family Communication: None at bedside, will attempt to call son this afternoon   Consults, Procedures, Significant Events   Consultants:   Urology  Procedures:   None  Antimicrobials:  Anti-infectives (From admission, onward)   Start     Dose/Rate Route Frequency Ordered Stop   12/12/20 1000  remdesivir 100 mg in sodium chloride 0.9 % 100 mL IVPB       "Followed by" Linked Group Details   100 mg 200 mL/hr over 30 Minutes Intravenous Daily 12/11/20 1047 12/15/20 1204   12/11/20 1200  cefTRIAXone (ROCEPHIN) 1 g in sodium chloride 0.9 % 100  mL IVPB        1 g 200 mL/hr over 30 Minutes Intravenous Every 24 hours 12/10/20 2315     12/11/20 1145  remdesivir 200 mg in sodium chloride 0.9% 250 mL IVPB       "Followed by" Linked Group Details   200 mg 580 mL/hr over 30 Minutes Intravenous Once 12/11/20 1047 12/11/20 1537   12/10/20 2215  cefTRIAXone (ROCEPHIN) 1 g in sodium chloride 0.9 % 100 mL IVPB        1 g 200 mL/hr over 30 Minutes Intravenous  Once 12/10/20 2211 12/11/20 0125        Objective   Vitals:   12/16/20 0533 12/16/20 0829 12/16/20 1143 12/16/20 1556  BP: 135/71 122/74 (!) 110/56 (!) 104/45  Pulse: 85 73 (!) 53 77  Resp: 16 15 17 18   Temp: 97.8 F (36.6 C) 97.8 F (36.6 C) 98.2 F (36.8 C) 98.3 F (36.8 C)  TempSrc: Oral Oral Oral   SpO2: 97% 100% 100% 92%  Weight:      Height:        Intake/Output Summary (Last 24 hours) at 12/16/2020  1757 Last data filed at 12/16/2020 1559 Gross per 24 hour  Intake --  Output 1000 ml  Net -1000 ml   Filed Weights   12/10/20 1153  Weight: 43.1 kg    Physical Exam:  General exam: awake, alert, no acute distress, cachectic and chronically ill-appearing HEENT: Temporal wasting, dry mucus membranes, hearing grossly normal  Respiratory system: Decreased breath sounds, no wheezes or rhonchi, normal respiratory effort. Cardiovascular system: normal S1/S2, RRR, no pedal edema.   Gastrointestinal system: Sunken abdomen, soft, NT, ND, +bowel sounds. Central nervous system: no gross focal neurologic deficits, normal speech Extremities: no cyanosis, normal tone Skin: dry, intact, normal temperature    Labs   Data Reviewed: I have personally reviewed following labs and imaging studies  CBC: Recent Labs  Lab 12/12/20 0532 12/13/20 0627 12/14/20 0403 12/15/20 0739 12/16/20 0643  WBC 3.8* 6.6 6.6 8.6 9.9  NEUTROABS 2.6 4.9 5.3 6.7 8.0*  HGB 11.5* 10.4* 9.1* 9.7* 9.7*  HCT 35.7* 32.7* 28.1* 31.5* 31.5*  MCV 92.0 93.7 93.0 98.4 97.5  PLT 381 370 312 311 660   Basic Metabolic Panel: Recent Labs  Lab 12/12/20 0532 12/13/20 0627 12/14/20 0403 12/15/20 0739 12/16/20 0643  NA 143 143 145 147* 148*  K 5.4* 5.0 4.3 4.5 4.7  CL 112* 114* 116* 120* 120*  CO2 18* 19* 18* 17* 19*  GLUCOSE 85 94 107* 95 79  BUN 85* 89* 91* 81* 67*  CREATININE 2.56* 2.44* 2.16* 1.73* 1.53*  CALCIUM 8.8* 8.6* 8.4* 8.4* 8.6*  MG 2.1 2.2 2.1 2.2 2.3  PHOS 5.2* 4.0 3.5 2.3* 2.4*   GFR: Estimated Creatinine Clearance: 19.3 mL/min (A) (by C-G formula based on SCr of 1.53 mg/dL (H)). Liver Function Tests: Recent Labs  Lab 12/12/20 0532 12/13/20 0627 12/14/20 0403 12/15/20 0739 12/16/20 0643  AST 11* 15 12* 17 16  ALT 9 8 8 9 10   ALKPHOS 62 56 53 54 56  BILITOT 0.5 0.5 0.4 0.3 0.3  PROT 6.3* 6.0* 5.5* 5.5* 5.3*  ALBUMIN 2.3* 2.1* 2.1* 2.1* 2.2*   Recent Labs  Lab 12/10/20 1156  LIPASE 26    No results for input(s): AMMONIA in the last 168 hours. Coagulation Profile: No results for input(s): INR, PROTIME in the last 168 hours. Cardiac Enzymes: No results for input(s): CKTOTAL, CKMB, CKMBINDEX, TROPONINI in  the last 168 hours. BNP (last 3 results) No results for input(s): PROBNP in the last 8760 hours. HbA1C: No results for input(s): HGBA1C in the last 72 hours. CBG: No results for input(s): GLUCAP in the last 168 hours. Lipid Profile: No results for input(s): CHOL, HDL, LDLCALC, TRIG, CHOLHDL, LDLDIRECT in the last 72 hours. Thyroid Function Tests: No results for input(s): TSH, T4TOTAL, FREET4, T3FREE, THYROIDAB in the last 72 hours. Anemia Panel: No results for input(s): VITAMINB12, FOLATE, FERRITIN, TIBC, IRON, RETICCTPCT in the last 72 hours. Sepsis Labs: Recent Labs  Lab 12/11/20 1158 12/12/20 0532 12/13/20 0627  PROCALCITON 0.53 1.03 0.63    Recent Results (from the past 240 hour(s))  SARS CORONAVIRUS 2 (TAT 6-24 HRS) Nasopharyngeal Nasopharyngeal Swab     Status: Abnormal   Collection Time: 12/10/20  9:21 PM   Specimen: Nasopharyngeal Swab  Result Value Ref Range Status   SARS Coronavirus 2 POSITIVE (A) NEGATIVE Final    Comment: (NOTE) SARS-CoV-2 target nucleic acids are DETECTED.  The SARS-CoV-2 RNA is generally detectable in upper and lower respiratory specimens during the acute phase of infection. Positive results are indicative of the presence of SARS-CoV-2 RNA. Clinical correlation with patient history and other diagnostic information is  necessary to determine patient infection status. Positive results do not rule out bacterial infection or co-infection with other viruses.  The expected result is Negative.  Fact Sheet for Patients: SugarRoll.be  Fact Sheet for Healthcare Providers: https://www.woods-mathews.com/  This test is not yet approved or cleared by the [place] FDA and  has been  authorized for detection and/or diagnosis of SARS-CoV-2 by FDA under an Emergency Use Authorization (EUA). This EUA will remain  in effect (meaning this test can be used) for the duration of the COVID-19 declaration under Section 564(b)(1) of the Act, 21 U. S.C. section 360bbb-3(b)(1), unless the authorization is terminated or revoked sooner.   Performed at [facility], [place] [address], [place], [place] [address]   Urine Culture     Status: Abnormal   Collection Time: 12/10/20  9:21 PM   Specimen: Urine, Random  Result Value Ref Range Status   Specimen Description   Final    URINE, RANDOM Performed at [facility], [address], [place], [place] [address]    Special Requests   Final    NONE Performed at [facility], [address] [place], [place] [address]    Culture MULTIPLE SPECIES PRESENT, SUGGEST RECOLLECTION (A)  Final   Report Status 12/12/2020 FINAL  Final      Imaging Studies   No results found.   Medications   Scheduled Meds: . apixaban  2.5 mg Oral BID  . Chlorhexidine Gluconate Cloth  6 each Topical Daily  . dexamethasone (DECADRON) injection  6 mg Intravenous Daily  . feeding supplement  237 mL Oral BID BM  . fluticasone furoate-vilanterol  1 puff Inhalation Daily  . levothyroxine  150 mcg Oral Daily  . metoprolol tartrate  25 mg Oral BID  . midodrine  2.5 mg Oral TID WC  . multivitamin with minerals  1 tablet Oral Daily   Continuous Infusions: . sodium chloride 75 mL/hr at 12/16/20 0624  . cefTRIAXone (ROCEPHIN)  IV 1 g (12/16/20 1302)       LOS: 5 days    Time spent: 30 minutes with greater than 50% spent at bedside in coordination of care    Ezekiel Slocumb, DO [facility]  12/16/2020, 5:57 PM  If 7PM-7AM, please contact night-coverage. How to contact the [facility] Attending or Consulting provider [place] or covering provider during after hours [place], for this patient?    1. Check the care team in  [facility] and look for a) attending/consulting [facility] provider listed and b) the [facility] team listed 2. Log into [email or web address] and use [place]'s universal password to access. If you do not have the password, please contact the hospital operator. 3. Locate the [facility] provider you are looking for under [facility] and page to a number that you can be directly reached. 4. If you still have difficulty reaching the provider, please page the [facility] (Director on Call) for the Hospitalists listed on amion for assistance.

## 2020-12-17 DIAGNOSIS — J410 Simple chronic bronchitis: Secondary | ICD-10-CM | POA: Diagnosis not present

## 2020-12-17 DIAGNOSIS — N3000 Acute cystitis without hematuria: Secondary | ICD-10-CM | POA: Diagnosis not present

## 2020-12-17 DIAGNOSIS — N179 Acute kidney failure, unspecified: Secondary | ICD-10-CM | POA: Diagnosis not present

## 2020-12-17 DIAGNOSIS — U071 COVID-19: Secondary | ICD-10-CM | POA: Diagnosis not present

## 2020-12-17 LAB — CBC
HCT: 31.4 % — ABNORMAL LOW (ref 36.0–46.0)
Hemoglobin: 10 g/dL — ABNORMAL LOW (ref 12.0–15.0)
MCH: 30.2 pg (ref 26.0–34.0)
MCHC: 31.8 g/dL (ref 30.0–36.0)
MCV: 94.9 fL (ref 80.0–100.0)
Platelets: 311 10*3/uL (ref 150–400)
RBC: 3.31 MIL/uL — ABNORMAL LOW (ref 3.87–5.11)
RDW: 19.4 % — ABNORMAL HIGH (ref 11.5–15.5)
WBC: 10.6 10*3/uL — ABNORMAL HIGH (ref 4.0–10.5)
nRBC: 0 % (ref 0.0–0.2)

## 2020-12-17 LAB — BASIC METABOLIC PANEL
Anion gap: 6 (ref 5–15)
BUN: 60 mg/dL — ABNORMAL HIGH (ref 8–23)
CO2: 20 mmol/L — ABNORMAL LOW (ref 22–32)
Calcium: 8.4 mg/dL — ABNORMAL LOW (ref 8.9–10.3)
Chloride: 121 mmol/L — ABNORMAL HIGH (ref 98–111)
Creatinine, Ser: 1.51 mg/dL — ABNORMAL HIGH (ref 0.44–1.00)
GFR, Estimated: 34 mL/min — ABNORMAL LOW (ref >60)
Glucose, Bld: 93 mg/dL (ref 70–99)
Potassium: 4.8 mmol/L (ref 3.5–5.1)
Sodium: 147 mmol/L — ABNORMAL HIGH (ref 135–145)

## 2020-12-17 LAB — FIBRIN DERIVATIVES D-DIMER (ARMC ONLY): Fibrin derivatives D-dimer (ARMC): 983.49 ng/mL (FEU) — ABNORMAL HIGH (ref 0.00–499.00)

## 2020-12-17 LAB — FIBRINOGEN: Fibrinogen: 324 mg/dL (ref 210–475)

## 2020-12-17 MED ORDER — DEXTROSE IN LACTATED RINGERS 5 % IV SOLN: INTRAVENOUS | Status: DC

## 2020-12-17 NOTE — Plan of Care (Signed)

## 2020-12-17 NOTE — Progress Notes (Signed)
Initial Nutrition Assessment  DOCUMENTATION CODES:   Severe malnutrition in context of chronic illness,Underweight  INTERVENTION:  Increase Ensure Enlive po TID, each supplement provides 350 kcal and 20 grams of protein  Continue Magic cup TID with meals, each supplement provides 290 kcal and 9 grams of protein  Continue MVI po daily  NUTRITION DIAGNOSIS:   Severe Malnutrition related to chronic illness (COPD, CKD) as evidenced by severe fat depletion,severe muscle depletion. -ongoing  GOAL:   Patient will meet greater than or equal to 90% of their needs -unmet  MONITOR:   PO intake,Supplement acceptance,Labs,Weight trends,I & O's  REASON FOR ASSESSMENT:   Consult Assessment of nutrition requirement/status  ASSESSMENT:   80 year old female with PMHx of HTN, COPD, hypothyroidism, CVA, breast cancer s/p lumpectomy and XRT admitted with COVID-19, acute renal failure on CKD, acute cystitis.  RD working remotely.  Attempted to contact pt via phone for nutrition follow-up this afternoon, however she did answer. On 1/25 pt ate 25% of breakfast tray, no other documented meals for review. RD called nurses station, requested to speak with her nurse. At this time RN is in with another pt and unavailable. Will attempt to contact later today as time allows. Pt has been accepting of most Ensure supplements, noted 3 refusals out of 11 offered this admission. She is also receiving Magic Cup with meals. Will increase Ensure to three times daily.   No new weights to trend. I/Os: -26.8 ml since admit UOP: 1800 ml x 24 hrs  Palliative has met with patient as well as spoken with son. Pt is to discharge home with home health, hospice care services can be provided if family desires.  Medications reviewed and include: Decadron, MVI, Rocephin  Labs: Na 147 (H), BUN 60 (H), Cr 1.61 (H), WBC 10.6 (H), Hgb 10 (L), HCT 31.4 (L)   Diet Order:   Diet Order            DIET - DYS 1 Room service  appropriate? Yes; Fluid consistency: Thin  Diet effective now                 EDUCATION NEEDS:   No education needs have been identified at this time  Skin:  Skin Assessment: Skin Integrity Issues: Skin Integrity Issues:: Stage I Stage I: sacrum  Last BM:  Unknown  Height:   Ht Readings from Last 1 Encounters:  12/10/20 '5\' 8"'  (1.727 m)    Weight:   Wt Readings from Last 1 Encounters:  12/10/20 43.1 kg    BMI:  Body mass index is 14.44 kg/m.  Estimated Nutritional Needs:   Kcal:  1300-1500  Protein:  65-75 grams  Fluid:  1.3-1.5 L/day   Lajuan Lines, RD, LDN Clinical Nutrition After Hours/Weekend Pager # in [place]

## 2020-12-17 NOTE — Progress Notes (Signed)
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  She was unable to provide any collateral information on admission  Last hospitalization was 10/06/2020 until 10/21/2020 for abdominal pain which ultimately resolved on its own.  She had been seen by GI and general surgery during that hospitalization as well.  She was discharged to [facility]. On admission it is noted that COVID PCR was positive.  Her son also stated that she was positive for COVID on 11/16/2020 at [facility].  On work-up in the ER she was found to have UA concerning for possible infection and was started on Rocephin; urine culture was sent. At that time her PCR had not yet resulted.  CT head was unremarkable for acute findings.  Age-related atrophy and chronic small vessel ischemia noted. CT renal stone performed revealing stable but severe left-sided hydronephrosis with left sided Endo ureteral stent in place.  Urinary bladder wall thickening possibly due to cystitis.  And right basilar atelectasis/infiltrate.  CXR also was relatively clear except for the mild linear atelectatic changes appreciated in the right base that were seen on CT as well.  She was admitted for antibiotics, fluid resuscitation, and treatment for COVID-19 infection. She was started on Decadron and remdesivir.     Assessment & Plan   Principal Problem:   Acute renal failure superimposed on stage 3a chronic kidney disease (HCC) Active Problems:   Hypothyroidism   Hypertension   COPD (chronic obstructive pulmonary disease) (HCC)   Pressure injury of  skin   Atrial fibrillation (HCC)   COVID-19   Acute cystitis   Normocytic anemia   AKI superimposed on CKD stage 3a -POA, improving.   Due to obstructive uropathy as outlined below in addition to prerenal azotemia. Baseline creatinine approximately 1.2-1.3, presented with creatinine 1.96.  Creatinine today 1.51. --Hold ACE inhibitor --Continue IV fluids -changed to D5W-LR (due to hypernatremia and hyperchloremia)   Chronic Lt UPJ obstruction - Ureteral stent initially placed on 10/08/2020 by Dr. Diamantina Providence in the setting of solitary kidney, chronic left UPJ obstruction and UTI.  Subsequently seen as an outpatient on 10/2021/21, and was deemed a poor surgical candidate for definitive reconstruction. Renal pelvic diameter pre and post stent have improved from 9 cm to 6 cm, suggestive that stent is functional. --Urology consulted, input appreciated --Urology recommended Foley to maximize bladder emptying in addition to hydration --1/25 urology recommended DC Foley as creatinine was trending down.  -- Foley should be replaced if creatinine again trends upward. --Per urology, empiric antibiotics for total 10 to 14 days -on Rocephin   Acute cystitis - Hx of recent UTI in November 2021, and left UPJ obstruction requiring ureteral stent placement on 10/08/2020.  Urine culture obtained on admission grew multiple species. --Continue empiric antibiotics per urology as above --Due to chronic stent, patient likely has colonization, monitor for symptoms closely   Chronic respiratory failure with hypoxia due to COPD -uses 2 L/min nasal cannula oxygen at baseline. COPD not currently exacerbated, no wheezing on exam.  Chest x-ray relatively clear. --Continue steroid as below --Continue home regimen for  COPD --Monitor respiratory status closely --Supplemental oxygen to maintain O2 sat 88 to 93%   Atrial fibrillation - Rate controlled.  Beta-blocker was previously stopped due to hypotension, and  patient was started on midodrine.  BP improved this admission, metoprolol has been added with hold parameters. --Continue Eliquis and metoprolol --Monitor heart rate and blood pressure closely   COVID-19 infection -tested positive on 11/16/2020.  Patient is vaccinated and received a booster.  She tested positive at SNF but was not treated with anything.  Still positive on admission here.  Due to lethargy, borderline low oxygen saturation, underlying COPD and hypoxia in addition to other comorbidities, patient felt very high risk for progression to severe illness.  She was started on treatment for Covid with dexamethasone and remdesivir --Completed remdesivir --Continue dexamethasone --COPD treatment as above --Monitor respiratory status   Hypernatremia -due to poor p.o. intake. Sodium 147>> 148>> one 3:47 days. --Gentle D5w-LR   Hyperkalemia - Resolved with dose of Kayexalate. Remains stable -- Monitor BMP   Hypothyroidism - takes Synthroid 150 mcg at home.  TSH 11.746 on admission which may be also reactive given acute illness. Free T4 is actually very mildly elevated, 1.18 --needs repeat TSH in 4 to 6 weeks for further management and wants acute clinical phase improves --continue current dose Synthroid   Normocytic anemia - Baseline hemoglobin ~9 to 10 g/dL. Stable and at baseline. --Monitor CBC.     Hypertension - lisinopril on hold due to AKI.  Beta blocker resumed.   Blood pressures have been controlled for the most part and soft at times. --Monitor BP.   Severe protein calorie malnutrition / Underweight -  Patient BMI: Body mass index is 14.44 kg/m.  --Palliative care consulted. --Dietitian following.  --Continue supplements and vitamins per orders. --Monitor and replace electrolytes as needed.   DVT prophylaxis: apixaban (ELIQUIS) tablet 2.5 mg Start: 12/10/20 2315 apixaban (ELIQUIS) tablet 2.5 mg   Diet:  Diet Orders (From admission, onward)    Start      Ordered   12/11/20 1813  DIET - DYS 1 Room service appropriate? Yes; Fluid consistency: Thin  Diet effective now       Question Answer Comment  Room service appropriate? Yes   Fluid consistency: Thin      12/11/20 1812            Code Status: DNR    Subjective 12/17/20    Pt awake with sister at bedside feeding her lunch when seen today. Patient is very withdrawn and does not interact or one answer questions which is typical for her. She says she feels fine. Sister asked when she will likely go home, anticipate in one or 2 days.   Disposition Plan & Communication   Status is: Inpatient  Remains inpatient appropriate because:IV treatments appropriate due to intensity of illness or inability to take PO   Dispo: The patient is from: Home              Anticipated d/c is to: Home              Anticipated d/c date is: 2 days              Patient currently is not medically stable to d/c.   Difficult to place patient No  Family Communication: Sister was at bedside feeding pt during encounter today 1/27.  Will attempt to call son  Consults, Procedures, Significant Events   Consultants:   Urology  Procedures:   None  Antimicrobials:  Anti-infectives (From admission, onward)   Start     Dose/Rate Route Frequency Ordered Stop   12/12/20 1000  remdesivir 100 mg in sodium chloride 0.9 % 100 mL IVPB       "Followed by" Linked Group Details   100 mg 200 mL/hr over 30 Minutes Intravenous Daily 12/11/20 1047 12/15/20 1204   12/11/20 1200  cefTRIAXone (ROCEPHIN) 1 g in sodium chloride 0.9 % 100 mL IVPB        1 g 200 mL/hr over 30 Minutes Intravenous Every 24 hours 12/10/20 2315     12/11/20 1145  remdesivir 200 mg in sodium chloride 0.9% 250 mL IVPB       "Followed by" Linked Group Details   200 mg 580 mL/hr over 30 Minutes Intravenous Once 12/11/20 1047 12/11/20 1537   12/10/20 2215  cefTRIAXone (ROCEPHIN) 1 g in sodium chloride 0.9 % 100 mL IVPB        1 g 200 mL/hr over 30  Minutes Intravenous  Once 12/10/20 2211 12/11/20 0125        Objective   Vitals:   12/16/20 2101 12/17/20 0421 12/17/20 0851 12/17/20 1157  BP: 125/68 133/63 (!) 157/79 133/72  Pulse: 92 75 91 78  Resp:   16 15  Temp: 97.8 F (36.6 C)  97.7 F (36.5 C) 97.7 F (36.5 C)  TempSrc: Oral  Oral Oral  SpO2: 100% 96% 95% 96%  Weight:      Height:        Intake/Output Summary (Last 24 hours) at 12/17/2020 1354 Last data filed at 12/17/2020 0654 Gross per 24 hour  Intake --  Output 1800 ml  Net -1800 ml   Filed Weights   12/10/20 1153  Weight: 43.1 kg    Physical Exam:  General exam: awake, alert, no acute distress, cachectic and chronically ill-appearing HEENT: Temporal wasting, moist mucus membranes, hearing grossly normal  Respiratory system: Decreased breath sounds, normal respiratory effort. Cardiovascular system: normal S1/S2, RRR, no pedal edema.   Gastrointestinal system: Sunken abdomen, soft, NT, ND Extremities: no cyanosis, normal tone Psychiatric: withdrawn, depressed mood, flat affect, unable to assess judgment/insight as pt does not interact during encounter    Labs   Data Reviewed: I have personally reviewed following labs and imaging studies  CBC: Recent Labs  Lab 12/12/20 0532 12/13/20 0627 12/14/20 0403 12/15/20 0739 12/16/20 0643 12/17/20 0806  WBC 3.8* 6.6 6.6 8.6 9.9 10.6*  NEUTROABS 2.6 4.9 5.3 6.7 8.0*  --   HGB 11.5* 10.4* 9.1* 9.7* 9.7* 10.0*  HCT 35.7* 32.7* 28.1* 31.5* 31.5* 31.4*  MCV 92.0 93.7 93.0 98.4 97.5 94.9  PLT 381 370 312 311 279 [id number]   Basic Metabolic Panel: Recent Labs  Lab 12/12/20 0532 12/13/20 0627 12/14/20 0403 12/15/20 0739 12/16/20 0643 12/17/20 0806  NA 143 143 145 147* 148* 147*  K 5.4* 5.0 4.3 4.5 4.7 4.8  CL 112* 114* 116* 120* 120* 121*  CO2 18* 19* 18* 17* 19* 20*  GLUCOSE 85 94 107* 95 79 93  BUN 85* 89* 91* 81* 67* 60*  CREATININE 2.56* 2.44* 2.16* 1.73* 1.53* 1.51*  CALCIUM 8.8* 8.6* 8.4* 8.4*  8.6* 8.4*  MG 2.1 2.2 2.1 2.2 2.3  --   PHOS 5.2* 4.0 3.5 2.3* 2.4*  --    GFR: Estimated Creatinine Clearance: 19.5 mL/min (A) (by C-G formula based on SCr of 1.51 mg/dL (H)). Liver Function Tests: Recent Labs  Lab 12/12/20 0532 12/13/20 0627 12/14/20 0403 12/15/20  9833 12/16/20 0643  AST 11* 15 12* 17 16  ALT 9 8 8 9 10   ALKPHOS 62 56 53 54 56  BILITOT 0.5 0.5 0.4 0.3 0.3  PROT 6.3* 6.0* 5.5* 5.5* 5.3*  ALBUMIN 2.3* 2.1* 2.1* 2.1* 2.2*   No results for input(s): LIPASE, AMYLASE in the last 168 hours. No results for input(s): AMMONIA in the last 168 hours. Coagulation Profile: No results for input(s): INR, PROTIME in the last 168 hours. Cardiac Enzymes: No results for input(s): CKTOTAL, CKMB, CKMBINDEX, TROPONINI in the last 168 hours. BNP (last 3 results) No results for input(s): PROBNP in the last 8760 hours. HbA1C: No results for input(s): HGBA1C in the last 72 hours. CBG: No results for input(s): GLUCAP in the last 168 hours. Lipid Profile: No results for input(s): CHOL, HDL, LDLCALC, TRIG, CHOLHDL, LDLDIRECT in the last 72 hours. Thyroid Function Tests: No results for input(s): TSH, T4TOTAL, FREET4, T3FREE, THYROIDAB in the last 72 hours. Anemia Panel: No results for input(s): VITAMINB12, FOLATE, FERRITIN, TIBC, IRON, RETICCTPCT in the last 72 hours. Sepsis Labs: Recent Labs  Lab 12/11/20 1158 12/12/20 0532 12/13/20 0627  PROCALCITON 0.53 1.03 0.63    Recent Results (from the past 240 hour(s))  SARS CORONAVIRUS 2 (TAT 6-24 HRS) Nasopharyngeal Nasopharyngeal Swab     Status: Abnormal   Collection Time: 12/10/20  9:21 PM   Specimen: Nasopharyngeal Swab  Result Value Ref Range Status   SARS Coronavirus 2 POSITIVE (A) NEGATIVE Final    Comment: (NOTE) SARS-CoV-2 target nucleic acids are DETECTED.  The SARS-CoV-2 RNA is generally detectable in upper and lower respiratory specimens during the acute phase of infection. Positive results are indicative of the  presence of SARS-CoV-2 RNA. Clinical correlation with patient history and other diagnostic information is  necessary to determine patient infection status. Positive results do not rule out bacterial infection or co-infection with other viruses.  The expected result is Negative.  Fact Sheet for Patients: SugarRoll.be  Fact Sheet for Healthcare Providers: https://www.woods-mathews.com/  This test is not yet approved or cleared by the [place] FDA and  has been authorized for detection and/or diagnosis of SARS-CoV-2 by FDA under an Emergency Use Authorization (EUA). This EUA will remain  in effect (meaning this test can be used) for the duration of the COVID-19 declaration under Section 564(b)(1) of the Act, 21 U. S.C. section 360bbb-3(b)(1), unless the authorization is terminated or revoked sooner.   Performed at [facility], [place] [address], [place], [place] [address]   Urine Culture     Status: Abnormal   Collection Time: 12/10/20  9:21 PM   Specimen: Urine, Random  Result Value Ref Range Status   Specimen Description   Final    URINE, RANDOM Performed at [facility], [address], [place], [place] [address]    Special Requests   Final    NONE Performed at [facility], [address] [place], [place] [address]    Culture MULTIPLE SPECIES PRESENT, SUGGEST RECOLLECTION (A)  Final   Report Status 12/12/2020 FINAL  Final      Imaging Studies   No results found.   Medications   Scheduled Meds: . apixaban  2.5 mg Oral BID  . Chlorhexidine Gluconate Cloth  6 each Topical Daily  . dexamethasone (DECADRON) injection  6 mg Intravenous Daily  . feeding supplement  237 mL Oral BID BM  . fluticasone furoate-vilanterol  1 puff Inhalation Daily  . levothyroxine  150 mcg Oral  Daily  . metoprolol tartrate  25 mg Oral BID  . midodrine  2.5 mg Oral TID WC  . multivitamin with minerals  1 tablet Oral  Daily   Continuous Infusions: . sodium chloride 75 mL/hr at 12/16/20 2311  . cefTRIAXone (ROCEPHIN)  IV 1 g (12/17/20 1256)       LOS: 6 days    Time spent: 25 minutes with greater than 50% spent at bedside in coordination of care    Ezekiel Slocumb, DO [facility]  12/17/2020, 1:54 PM    If 7PM-7AM, please contact night-coverage. How to contact the [facility] Attending or Consulting provider [place] or covering provider during after hours [place], for this patient?    1. Check the care team in [facility] and look for a) attending/consulting [facility] provider listed and b) the [facility] team listed 2. Log into [email or web address] and use [facility]'s universal password to access. If you do not have the password, please contact the hospital operator. 3. Locate the [facility] provider you are looking for under [facility] and page to a number that you can be directly reached. 4. If you still have difficulty reaching the provider, please page the [facility] (Director on Call) for the Hospitalists listed on amion for assistance.

## 2020-12-18 DIAGNOSIS — R4182 Altered mental status, unspecified: Secondary | ICD-10-CM | POA: Diagnosis not present

## 2020-12-18 DIAGNOSIS — Z515 Encounter for palliative care: Secondary | ICD-10-CM | POA: Diagnosis not present

## 2020-12-18 DIAGNOSIS — Z7189 Other specified counseling: Secondary | ICD-10-CM | POA: Diagnosis not present

## 2020-12-18 DIAGNOSIS — I480 Paroxysmal atrial fibrillation: Secondary | ICD-10-CM | POA: Diagnosis not present

## 2020-12-18 DIAGNOSIS — U071 COVID-19: Secondary | ICD-10-CM | POA: Diagnosis not present

## 2020-12-18 DIAGNOSIS — J410 Simple chronic bronchitis: Secondary | ICD-10-CM | POA: Diagnosis not present

## 2020-12-18 DIAGNOSIS — N179 Acute kidney failure, unspecified: Secondary | ICD-10-CM | POA: Diagnosis not present

## 2020-12-18 LAB — BASIC METABOLIC PANEL
Anion gap: 6 (ref 5–15)
BUN: 55 mg/dL — ABNORMAL HIGH (ref 8–23)
CO2: 21 mmol/L — ABNORMAL LOW (ref 22–32)
Calcium: 8.4 mg/dL — ABNORMAL LOW (ref 8.9–10.3)
Chloride: 123 mmol/L — ABNORMAL HIGH (ref 98–111)
Creatinine, Ser: 1.34 mg/dL — ABNORMAL HIGH (ref 0.44–1.00)
GFR, Estimated: 40 mL/min — ABNORMAL LOW (ref >60)
Glucose, Bld: 124 mg/dL — ABNORMAL HIGH (ref 70–99)
Potassium: 4.6 mmol/L (ref 3.5–5.1)
Sodium: 150 mmol/L — ABNORMAL HIGH (ref 135–145)

## 2020-12-18 LAB — CBC
HCT: 28.5 % — ABNORMAL LOW (ref 36.0–46.0)
Hemoglobin: 9.3 g/dL — ABNORMAL LOW (ref 12.0–15.0)
MCH: 30.6 pg (ref 26.0–34.0)
MCHC: 32.6 g/dL (ref 30.0–36.0)
MCV: 93.8 fL (ref 80.0–100.0)
Platelets: 300 10*3/uL (ref 150–400)
RBC: 3.04 MIL/uL — ABNORMAL LOW (ref 3.87–5.11)
RDW: 19.9 % — ABNORMAL HIGH (ref 11.5–15.5)
WBC: 11.3 10*3/uL — ABNORMAL HIGH (ref 4.0–10.5)
nRBC: 0 % (ref 0.0–0.2)

## 2020-12-18 LAB — C-REACTIVE PROTEIN: CRP: 0.6 mg/dL (ref <1.0)

## 2020-12-18 LAB — FIBRIN DERIVATIVES D-DIMER (ARMC ONLY): Fibrin derivatives D-dimer (ARMC): 789.98 ng/mL (FEU) — ABNORMAL HIGH (ref 0.00–499.00)

## 2020-12-18 LAB — PHOSPHORUS: Phosphorus: 2.2 mg/dL — ABNORMAL LOW (ref 2.5–4.6)

## 2020-12-18 MED ORDER — TRAMADOL HCL 50 MG PO TABS
50.0000 mg | ORAL_TABLET | Freq: Four times a day (QID) | ORAL | Status: DC | PRN
  Administered 2020-12-18: 50 mg via ORAL
  Filled 2020-12-18: qty 1

## 2020-12-18 MED ORDER — K PHOS MONO-SOD PHOS DI & MONO 155-852-130 MG PO TABS
500.0000 mg | ORAL_TABLET | Freq: Two times a day (BID) | ORAL | Status: AC
  Administered 2020-12-18 (×2): 500 mg via ORAL
  Filled 2020-12-18 (×2): qty 2

## 2020-12-18 MED ORDER — DEXTROSE 5 % IV SOLN: INTRAVENOUS | Status: DC

## 2020-12-18 MED ORDER — DM-GUAIFENESIN ER 30-600 MG PO TB12: 1.0000 | ORAL_TABLET | Freq: Two times a day (BID) | ORAL | Status: DC

## 2020-12-18 NOTE — Progress Notes (Addendum)
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    Hospital admission 10/06/2020 until 10/21/2020 for abdominal pain which ultimately resolved on its own.  She had been seen by GI and general surgery during that hospitalization as well.  She was discharged to [facility].  On admission, COVID PCR positive.  Family reported pt had tested positive for COVID on 11/16/2020 at [facility].  Evaluation in the ED - UA concerning for possible infection and was started on Rocephin; urine culture was sent.  CT head was unremarkable for acute findings.  CT renal stone showed stable but severe left-sided hydronephrosis with left sided Endo ureteral stent in place.  Urinary bladder wall thickening possibly due to cystitis.  And right basilar atelectasis/infiltrate.  CXR also was relatively clear except for the mild linear atelectatic changes appreciated in the right base that were seen on CT as well.  Admitted for antibiotics, fluid resuscitation, and treatment for COVID-19 infection.  She was started on Decadron and remdesivir.     Assessment & Plan   Principal Problem:   Acute renal failure superimposed on stage 3a chronic kidney disease (HCC) Active Problems:   Hypothyroidism   Hypertension   COPD (chronic obstructive pulmonary disease) (HCC)   Pressure injury of skin   Atrial fibrillation (HCC)   COVID-19   Acute cystitis   Normocytic anemia   Hypernatremia -due to poor p.o. intake, not enough water intake.  --D5w'@100'  cc/h --Recheck BMP in the morning --Encourage patient  to drink water frequently  Hypophosphatemia -replacing today.  Recheck Phos tomorrow.  Further replacement if needed.   AKI superimposed on CKD stage 3a -POA, improving.   Due to obstructive uropathy as outlined below in addition to prerenal azotemia. Baseline creatinine approximately 1.2-1.3, presented with creatinine 1.96.  Creatinine today 1.51. --Hold ACE inhibitor --Continue IV fluids -changed to D5W-LR (due to hypernatremia and hyperchloremia)   Chronic Lt UPJ obstruction - Ureteral stent initially placed on 10/08/2020 by Dr. Diamantina Providence in the setting of solitary kidney, chronic left UPJ obstruction and UTI.  Subsequently seen as an outpatient on 10/2021/21, and was deemed a poor surgical candidate for definitive reconstruction. Renal pelvic diameter pre and post stent have improved from 9 cm to 6 cm, suggestive that stent is functional. --Urology consulted, input appreciated --Urology recommended Foley to maximize bladder emptying in addition to hydration --1/25 urology recommended DC Foley as creatinine was trending down.  -- Foley should be replaced if creatinine again trends upward. --Per urology, empiric antibiotics for total 10 to 14 days -on Rocephin   Acute cystitis - Hx of recent UTI in November 2021, and left UPJ obstruction requiring ureteral stent placement on 10/08/2020.  Urine culture obtained on admission grew multiple species. --Continue empiric antibiotics per urology as above --Due to chronic stent, patient likely has colonization, monitor for symptoms closely   Chronic respiratory failure with hypoxia due to COPD -uses 2 L/min nasal cannula oxygen at baseline. COPD not currently exacerbated, no wheezing on exam.  Chest x-ray relatively clear. --Continue steroid as below --Continue home regimen  for COPD --Monitor respiratory status closely --Supplemental oxygen to maintain O2 sat 88 to 93%   Atrial fibrillation - Rate controlled.  Beta-blocker was  previously stopped due to hypotension, and patient was started on midodrine.  BP improved this admission, metoprolol has been added with hold parameters. --Continue Eliquis and metoprolol --Monitor heart rate and blood pressure closely   COVID-19 infection -tested positive on 11/16/2020.  Patient is vaccinated and received a booster.  She tested positive at SNF but was not treated with anything.  Still positive on admission here.  Due to lethargy, borderline low oxygen saturation, underlying COPD and hypoxia in addition to other comorbidities, patient felt very high risk for progression to severe illness.  She was started on treatment for Covid with dexamethasone and remdesivir --Completed remdesivir --Continue dexamethasone --COPD treatment as above --Monitor respiratory status   Hyperkalemia - Resolved with dose of Kayexalate. Remains stable -- Monitor BMP   Hypothyroidism - takes Synthroid 150 mcg at home.  TSH 11.746 on admission which may be also reactive given acute illness. Free T4 is actually very mildly elevated, 1.18 --needs repeat TSH in 4 to 6 weeks for further management and wants acute clinical phase improves --continue current dose Synthroid   Normocytic anemia - Baseline hemoglobin ~9 to 10 g/dL. Stable and at baseline. --Monitor CBC.     Hypertension - lisinopril on hold due to AKI.  Beta blocker resumed.   Blood pressures have been controlled for the most part and soft at times. --Monitor BP.   Severe protein calorie malnutrition / Underweight -  Patient BMI: Body mass index is 14.44 kg/m.  --Palliative care consulted -to follow at home after discharge --Dietitian following.  --Continue supplements and vitamins per orders. --Monitor and replace electrolytes as needed.   DVT prophylaxis: apixaban (ELIQUIS) tablet 2.5 mg Start: 12/10/20 2315 apixaban (ELIQUIS) tablet 2.5 mg   Diet:  Diet Orders (From admission, onward)    Start     Ordered   12/11/20  1813  DIET - DYS 1 Room service appropriate? Yes; Fluid consistency: Thin  Diet effective now       Question Answer Comment  Room service appropriate? Yes   Fluid consistency: Thin      12/11/20 1812            Code Status: DNR    Subjective 12/19/20    Pt crying with sister at bedside trying to calm her.  I sat at bedside for quite some time with patient today to attempt to elicit her wishes and goals of care.  She frequently starts crying and states "I just want out of here".  I asked her if she would want return to the hospital if she gets very sick again after going home, but she did not answer this.  Told her what [name] and I spoke about this morning and plans for getting her electrolytes improved over the next day or 2 and then get her home.  She is agreeable.  Sister reports that patient has cried out "Lord please take me" numerous times over the past several days.  I have met with this this myself however.    Disposition Plan & Communication   Status is: Inpatient  Remains inpatient appropriate because:IV treatments appropriate due to intensity of illness or inability to take PO   Dispo: The patient is from: Home              Anticipated d/c is to: Home  Anticipated d/c date is: 12/20/2020              Patient currently is not medically stable to d/c.   Difficult to place patient No  Family Communication: Sister was at bedside feeding pt during encounter today 1/28.   Spoke with son, [name], at length by phone this AM regarding patient's overall status, and hospice vs palliative care.  Advised he and family attempt to illicit patient's wishes regarding future hospital admissions and how much aggressive medical care she wants for herself.  Advised pt is hospice appropriate, and having palliative on board will benefit transition to hospice when the time comes.  He would like to get patient as optimal as we can, and hopes she will again begin to thrive once  home.  Consults, Procedures, Significant Events   Consultants:   Urology  Procedures:   None  Antimicrobials:  Anti-infectives (From admission, onward)   Start     Dose/Rate Route Frequency Ordered Stop   12/12/20 1000  remdesivir 100 mg in sodium chloride 0.9 % 100 mL IVPB       "Followed by" Linked Group Details   100 mg 200 mL/hr over 30 Minutes Intravenous Daily 12/11/20 1047 12/15/20 1204   12/11/20 1200  cefTRIAXone (ROCEPHIN) 1 g in sodium chloride 0.9 % 100 mL IVPB        1 g 200 mL/hr over 30 Minutes Intravenous Every 24 hours 12/10/20 2315 12/19/20 2359   12/11/20 1145  remdesivir 200 mg in sodium chloride 0.9% 250 mL IVPB       "Followed by" Linked Group Details   200 mg 580 mL/hr over 30 Minutes Intravenous Once 12/11/20 1047 12/11/20 1537   12/10/20 2215  cefTRIAXone (ROCEPHIN) 1 g in sodium chloride 0.9 % 100 mL IVPB        1 g 200 mL/hr over 30 Minutes Intravenous  Once 12/10/20 2211 12/11/20 0125        Objective   Vitals:   12/18/20 2137 12/19/20 0035 12/19/20 0444 12/19/20 0759  BP: (!) 145/75 137/65 (!) 142/76 (!) 106/56  Pulse: 85 65 71 97  Resp: '16 16 16 17  ' Temp: 97.9 F (36.6 C) 99.2 F (37.3 C) 98.2 F (36.8 C) 99.2 F (37.3 C)  TempSrc: Oral Oral Oral   SpO2: 95% 96% 91% 97%  Weight:      Height:        Intake/Output Summary (Last 24 hours) at 12/19/2020 5852 Last data filed at 12/19/2020 0457 Gross per 24 hour  Intake --  Output 1000 ml  Net -1000 ml   Filed Weights   12/10/20 1153  Weight: 43.1 kg    Physical Exam:  General exam: awake, alert, no acute distress, cachectic and chronically ill-appearing HEENT: Temporal wasting, moist mucus membranes, hearing grossly normal  Respiratory system: symmetric chest rise, normal respiratory effort. Cardiovascular system: normal S1/S2, RRR, no pedal edema.   Gastrointestinal system: Sunken abdomen, soft, NT, ND Extremities: no cyanosis, normal tone Psychiatric: tearful, depressed  mood, congruent affect    Labs   Data Reviewed: I have personally reviewed following labs and imaging studies  CBC: Recent Labs  Lab 12/13/20 0627 12/14/20 0403 12/15/20 0739 12/16/20 0643 12/17/20 0806 12/18/20 0308 12/19/20 0604  WBC 6.6 6.6 8.6 9.9 10.6* 11.3* 14.0*  NEUTROABS 4.9 5.3 6.7 8.0*  --   --   --   HGB 10.4* 9.1* 9.7* 9.7* 10.0* 9.3* 8.5*  HCT 32.7* 28.1* 31.5* 31.5* 31.4* 28.5* 27.8*  MCV 93.7 93.0 98.4 97.5 94.9 93.8 98.2  PLT 370 312 311 279 311 300 650   Basic Metabolic Panel: Recent Labs  Lab 12/13/20 0627 12/14/20 0403 12/15/20 0739 12/16/20 0643 12/17/20 0806 12/18/20 0308 12/19/20 0604  NA 143 145 147* 148* 147* 150* 146*  K 5.0 4.3 4.5 4.7 4.8 4.6 4.4  CL 114* 116* 120* 120* 121* 123* 116*  CO2 19* 18* 17* 19* 20* 21* 24  GLUCOSE 94 107* 95 79 93 124* 74  BUN 89* 91* 81* 67* 60* 55* 48*  CREATININE 2.44* 2.16* 1.73* 1.53* 1.51* 1.34* 1.17*  CALCIUM 8.6* 8.4* 8.4* 8.6* 8.4* 8.4* 8.3*  MG 2.2 2.1 2.2 2.3  --   --  2.1  PHOS 4.0 3.5 2.3* 2.4*  --  2.2* 2.1*   GFR: Estimated Creatinine Clearance: 25.2 mL/min (A) (by C-G formula based on SCr of 1.17 mg/dL (H)). Liver Function Tests: Recent Labs  Lab 12/13/20 0627 12/14/20 0403 12/15/20 0739 12/16/20 0643  AST 15 12* 17 16  ALT '8 8 9 10  ' ALKPHOS 56 53 54 56  BILITOT 0.5 0.4 0.3 0.3  PROT 6.0* 5.5* 5.5* 5.3*  ALBUMIN 2.1* 2.1* 2.1* 2.2*   No results for input(s): LIPASE, AMYLASE in the last 168 hours. No results for input(s): AMMONIA in the last 168 hours. Coagulation Profile: No results for input(s): INR, PROTIME in the last 168 hours. Cardiac Enzymes: No results for input(s): CKTOTAL, CKMB, CKMBINDEX, TROPONINI in the last 168 hours. BNP (last 3 results) No results for input(s): PROBNP in the last 8760 hours. HbA1C: No results for input(s): HGBA1C in the last 72 hours. CBG: No results for input(s): GLUCAP in the last 168 hours. Lipid Profile: No results for input(s): CHOL,  HDL, LDLCALC, TRIG, CHOLHDL, LDLDIRECT in the last 72 hours. Thyroid Function Tests: No results for input(s): TSH, T4TOTAL, FREET4, T3FREE, THYROIDAB in the last 72 hours. Anemia Panel: No results for input(s): VITAMINB12, FOLATE, FERRITIN, TIBC, IRON, RETICCTPCT in the last 72 hours. Sepsis Labs: Recent Labs  Lab 12/13/20 0627  PROCALCITON 0.63    Recent Results (from the past 240 hour(s))  SARS CORONAVIRUS 2 (TAT 6-24 HRS) Nasopharyngeal Nasopharyngeal Swab     Status: Abnormal   Collection Time: 12/10/20  9:21 PM   Specimen: Nasopharyngeal Swab  Result Value Ref Range Status   SARS Coronavirus 2 POSITIVE (A) NEGATIVE Final    Comment: (NOTE) SARS-CoV-2 target nucleic acids are DETECTED.  The SARS-CoV-2 RNA is generally detectable in upper and lower respiratory specimens during the acute phase of infection. Positive results are indicative of the presence of SARS-CoV-2 RNA. Clinical correlation with patient history and other diagnostic information is  necessary to determine patient infection status. Positive results do not rule out bacterial infection or co-infection with other viruses.  The expected result is Negative.  Fact Sheet for Patients: SugarRoll.be  Fact Sheet for Healthcare Providers: https://www.woods-mathews.com/  This test is not yet approved or cleared by the [place] FDA and  has been authorized for detection and/or diagnosis of SARS-CoV-2 by FDA under an Emergency Use Authorization (EUA). This EUA will remain  in effect (meaning this test can be used) for the duration of the COVID-19 declaration under Section 564(b)(1) of the Act, 21 U. S.C. section 360bbb-3(b)(1), unless the authorization is terminated or revoked sooner.   Performed at [facility], [place] [address], [place], [place] [address]   Urine Culture     Status: Abnormal   Collection Time:  12/10/20  9:21 PM   Specimen: Urine, Random  Result  Value Ref Range Status   Specimen Description   Final    URINE, RANDOM Performed at [facility], [address] [place], [place] [address]    Special Requests   Final    NONE Performed at [facility], [address] [place], [place] [address]    Culture MULTIPLE SPECIES PRESENT, SUGGEST RECOLLECTION (A)  Final   Report Status 12/12/2020 FINAL  Final      Imaging Studies   No results found.   Medications   Scheduled Meds: . apixaban  2.5 mg Oral BID  . Chlorhexidine Gluconate Cloth  6 each Topical Daily  . dexamethasone (DECADRON) injection  6 mg Intravenous Daily  . feeding supplement  237 mL Oral BID BM  . fluticasone furoate-vilanterol  1 puff Inhalation Daily  . levothyroxine  150 mcg Oral Daily  . metoprolol tartrate  25 mg Oral BID  . midodrine  2.5 mg Oral TID WC  . multivitamin with minerals  1 tablet Oral Daily   Continuous Infusions: . cefTRIAXone (ROCEPHIN)  IV 1 g (12/18/20 1203)  . dextrose 100 mL/hr at 12/18/20 0908       LOS: 8 days    Time spent: 45 minutes with greater than 50% spent at bedside in coordination of care    Ezekiel Slocumb, DO [facility]  12/19/2020, 9:24 AM    If 7PM-7AM, please contact night-coverage. How to contact the [facility] Attending or Consulting provider [place] or covering provider during after hours [place], for this patient?    1. Check the care team in [facility] and look for a) attending/consulting TR[facility] provider listed and b) the [facility] team listed 2. Log into [email or web address] and use [place]'s universal password to access. If you do not have the password, please contact the hospital operator. 3. Locate the [facility] provider you are looking for under [facility] and page to a number that you can be directly reached. 4. If you still have difficulty reaching the provider, please page the [facility] (Director on Call) for the Hospitalists listed on amion for assistance.

## 2020-12-18 NOTE — Progress Notes (Signed)
Palliative: Mrs. Sinning is sitting up quietly in bed.  She will make and somewhat keep eye contact.  She appears chronically ill, frail and cachectic.  She does look more alert today than when I last saw her.  She is oriented to self only, but able to make her basic needs known.  There is no family at bedside at this time.  I asked Mrs. Sebastiani if she would like something to eat or drink, cookie and "chocolate milk", but she declines any food or drink.  I shared that she should be going home soon, and Mrs. Birkey starts weeping.  She states that she is ready to go home.  I ask if she is in pain, but she states that she is not.  Call to son, Haley Fuerstenberg.  We talked about my visit with Mrs. Mothershead this morning.  [name] shares that they will frequently encourage Mrs. Papesh by stating, "here is the milk you asked for".  We talked about her poor nutritional intake, and that some families to use artificial feeding.  He shares that she would never accept a tube to feed her, she has this written in her advanced directives. We talked what seems to be memory loss.  [name] shares that he has noticed some change in mental status, which does not seem to be improving.   We talked in detail about outpatient palliative services to continue goals of care discussion, "the what if's and maybe's".  We also talked about transitioning to hospice care when the time is right.  I share my concerns about Mrs. Deadwyler's weight loss of 6 to 7 pounds per month for several months.  Conference with attending, bedside nursing staff, transition of care team related to patient condition, needs, goals of care.  Plan: At this point continue to treat the treatable but no CPR or intubation.  Home with home health services, active with outpatient palliative.  Would certainly benefit from hospice care.  97 minutes [name], NP Palliative medicine team Team phone [phone] Greater than 50% of this time was spent counseling and  coordinating care related to the above assessment and plan.

## 2020-12-18 NOTE — TOC Progression Note (Addendum)
Transition of Care [facility]) - Progression Note    Patient Details  Name: Cathy Solis MRN: [id number] Date of Birth: 08/05/1941  Transition [facility] CM/SW [place], LCSW Phone Number: 12/18/2020, 1:53 PM  Clinical Narrative:   Patient may DC over the weekend. Patient active with [facility]. [facility] to follow for Home Health RN, PT, and OT services. Informed Penny/Karen with [facility] and Helene Kelp with [facility] of possible DC.    Expected Discharge Plan: [place] Barriers to Discharge: Continued Medical Work up  Expected Discharge Plan and Services Expected Discharge Plan: [place]   Discharge Planning Services: CM Consult Post Acute Care Choice: [place] arrangements for the past 2 months: Single Family Home                 DME Arranged: N/A DME Agency: NA       HH Arranged: RN,PT,OT,Nurse's Aide [place]: [facility] [employer] [facility] [employer] Date [place]: 12/11/20 Time [place]: [place] Representative spoke with at [place]: [place] ([place]) Interventions    Readmission Risk Interventions Readmission Risk Prevention Plan 12/11/2020  Transportation Screening Complete  PCP or Specialist Appt within 3-5 Days Complete  HRI or [place] Complete  Social Work Consult for [place] Planning/Counseling Complete  Palliative Care Screening Not Complete  Palliative Care Screening Not Complete Comments could benefit from palliative consult  Medication Review Press photographer) Complete  Some recent data might be hidden

## 2020-12-18 NOTE — Care Management Important Message (Signed)
Important Message  Patient Details  Name: Cathy Solis MRN: [id number] Date of Birth: 1940-12-28   Medicare Important Message Given:  Yes  Left VM for son.   [place], LCSW 12/18/2020, 9:06 AM

## 2020-12-19 DIAGNOSIS — N179 Acute kidney failure, unspecified: Secondary | ICD-10-CM | POA: Diagnosis not present

## 2020-12-19 DIAGNOSIS — N1831 Chronic kidney disease, stage 3a: Secondary | ICD-10-CM | POA: Diagnosis not present

## 2020-12-19 DIAGNOSIS — I480 Paroxysmal atrial fibrillation: Secondary | ICD-10-CM | POA: Diagnosis not present

## 2020-12-19 LAB — BASIC METABOLIC PANEL
Anion gap: 6 (ref 5–15)
BUN: 48 mg/dL — ABNORMAL HIGH (ref 8–23)
CO2: 24 mmol/L (ref 22–32)
Calcium: 8.3 mg/dL — ABNORMAL LOW (ref 8.9–10.3)
Chloride: 116 mmol/L — ABNORMAL HIGH (ref 98–111)
Creatinine, Ser: 1.17 mg/dL — ABNORMAL HIGH (ref 0.44–1.00)
GFR, Estimated: 47 mL/min — ABNORMAL LOW (ref >60)
Glucose, Bld: 74 mg/dL (ref 70–99)
Potassium: 4.4 mmol/L (ref 3.5–5.1)
Sodium: 146 mmol/L — ABNORMAL HIGH (ref 135–145)

## 2020-12-19 LAB — FIBRIN DERIVATIVES D-DIMER (ARMC ONLY): Fibrin derivatives D-dimer (ARMC): 943.53 ng/mL (FEU) — ABNORMAL HIGH (ref 0.00–499.00)

## 2020-12-19 LAB — CBC
HCT: 27.8 % — ABNORMAL LOW (ref 36.0–46.0)
Hemoglobin: 8.5 g/dL — ABNORMAL LOW (ref 12.0–15.0)
MCH: 30 pg (ref 26.0–34.0)
MCHC: 30.6 g/dL (ref 30.0–36.0)
MCV: 98.2 fL (ref 80.0–100.0)
Platelets: 259 10*3/uL (ref 150–400)
RBC: 2.83 MIL/uL — ABNORMAL LOW (ref 3.87–5.11)
RDW: 19.9 % — ABNORMAL HIGH (ref 11.5–15.5)
WBC: 14 10*3/uL — ABNORMAL HIGH (ref 4.0–10.5)
nRBC: 0 % (ref 0.0–0.2)

## 2020-12-19 LAB — MAGNESIUM: Magnesium: 2.1 mg/dL (ref 1.7–2.4)

## 2020-12-19 LAB — PHOSPHORUS: Phosphorus: 2.1 mg/dL — ABNORMAL LOW (ref 2.5–4.6)

## 2020-12-19 MED ORDER — POTASSIUM & SODIUM PHOSPHATES 280-160-250 MG PO PACK
1.0000 | PACK | Freq: Three times a day (TID) | ORAL | Status: AC
  Administered 2020-12-19: 1 via ORAL
  Filled 2020-12-19 (×2): qty 1

## 2020-12-19 NOTE — Progress Notes (Signed)
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    Hospital admission 10/06/2020 until 10/21/2020 for abdominal pain which ultimately resolved on its own.  She had been seen by GI and general surgery during that hospitalization as well.  She was discharged to [facility].  On admission, COVID PCR positive.  Family reported pt had tested positive for COVID on 11/16/2020 at [facility].  Evaluation in the ED - UA concerning for possible infection and was started on Rocephin; urine culture was sent.  CT head was unremarkable for acute findings.  CT renal stone showed stable but severe left-sided hydronephrosis with left sided Endo ureteral stent in place.  Urinary bladder wall thickening possibly due to cystitis.  And right basilar atelectasis/infiltrate.  CXR also was relatively clear except for the mild linear atelectatic changes appreciated in the right base that were seen on CT as well.  Admitted for antibiotics, fluid resuscitation, and treatment for COVID-19 infection.  She was started on Decadron and remdesivir.     Assessment & Plan   Principal Problem:   Acute renal failure superimposed on stage 3a chronic kidney disease (HCC) Active Problems:   Hypothyroidism   Hypertension   COPD (chronic obstructive pulmonary disease) (HCC)   Pressure injury of skin   Atrial fibrillation (HCC)   COVID-19   Acute cystitis   Normocytic anemia   Hypernatremia -due to poor p.o. intake, not enough water intake.  --D5w@100  cc/h  --> currently with no IV, pt refused  replacing --Recheck BMP in the morning --Encourage patient to drink water frequently  Hypophosphatemia -1/28, 1/29 being replaced --pharmacy consulted  AKI superimposed on CKD stage 3a -POA, Resolved.   Due to obstructive uropathy as outlined below in addition to prerenal azotemia. Baseline creatinine approximately 1.2-1.3, presented with creatinine 1.96.  Creatinine today 1.17  Chronic Lt UPJ obstruction - Ureteral stent initially placed on 10/08/2020 by Dr. Diamantina Providence in the setting of solitary kidney, chronic left UPJ obstruction and UTI.  Subsequently seen as an outpatient on 10/2021/21, and was deemed a poor surgical candidate for definitive reconstruction. Renal pelvic diameter pre and post stent have improved from 9 cm to 6 cm, suggestive that stent is functional. --Urology consulted, input appreciated --Urology recommended Foley to maximize bladder emptying in addition to hydration --1/25 urology recommended DC Foley as creatinine was trending down.  -- Foley should be replaced if creatinine again trends upward. --Per urology, empiric antibiotics for total 10 to 14 days -on Rocephin  Acute cystitis - Hx of recent UTI in November 2021, and left UPJ obstruction requiring ureteral stent placement on 10/08/2020.  Urine culture obtained on admission grew multiple species. --Continue empiric antibiotics per urology as above --Due to chronic stent, patient likely has colonization, monitor for symptoms closely   Chronic respiratory failure with hypoxia due to COPD -uses 2 L/min nasal cannula oxygen at baseline. COPD not currently exacerbated, no wheezing on exam.  Chest x-ray relatively clear. --Continue steroid as below --Continue home regimen for COPD --Monitor respiratory status closely --Supplemental oxygen to maintain O2 sat 88  to 93%   Atrial fibrillation - Rate controlled.  Beta-blocker was previously stopped due to hypotension, and patient was started on midodrine.  BP  improved this admission, metoprolol has been added with hold parameters. --Continue Eliquis and metoprolol --Monitor heart rate and blood pressure closely   COVID-19 infection -tested positive on 11/16/2020.  Patient is vaccinated and received a booster.  She tested positive at SNF but was not treated with anything.  Still positive on admission here.  Due to lethargy, borderline low oxygen saturation, underlying COPD and hypoxia in addition to other comorbidities, patient felt very high risk for progression to severe illness.  She was started on treatment for Covid with dexamethasone and remdesivir --Completed remdesivir --Continue dexamethasone --COPD treatment as above --Monitor respiratory status   Hyperkalemia - Resolved with dose of Kayexalate. Remains stable -- Monitor BMP   Hypothyroidism - takes Synthroid 150 mcg at home.  TSH 11.746 on admission which may be also reactive given acute illness. Free T4 is actually very mildly elevated, 1.18 --needs repeat TSH in 4 to 6 weeks for further management and wants acute clinical phase improves --continue current dose Synthroid   Normocytic anemia - Baseline hemoglobin ~9 to 10 g/dL. Stable and at baseline. --Monitor CBC.     Hypertension - lisinopril on hold due to AKI.  Beta blocker resumed.   Blood pressures have been controlled for the most part and soft at times. --Monitor BP.   Severe protein calorie malnutrition / Underweight -  Patient BMI: Body mass index is 14.44 kg/m.  --Palliative care consulted -to follow at home after discharge --Dietitian following.  --Continue supplements and vitamins per orders. --Monitor and replace electrolytes as needed.   DVT prophylaxis: apixaban (ELIQUIS) tablet 2.5 mg Start: 12/10/20 2315 apixaban (ELIQUIS) tablet 2.5 mg   Diet:  Diet Orders (From admission, onward)    Start     Ordered   12/11/20 1813  DIET - DYS 1 Room service appropriate? Yes; Fluid consistency: Thin  Diet  effective now       Question Answer Comment  Room service appropriate? Yes   Fluid consistency: Thin      12/11/20 1812            Code Status: DNR    Subjective 12/19/20    Pt resting in bed, sister at bedside.  Pt says her back feels the same.  Sister indicated pt seemed to rest better than usual after tramadol given.  Pt lost IV and refused to have it replaced, per RN this morning.  Encouraged patient to drink plenty of water.  She begins crying and asks that I leave.  Disposition Plan & Communication   Status is: Inpatient  Remains inpatient appropriate because:IV treatments appropriate due to intensity of illness or inability to take PO   Dispo: The patient is from: Home              Anticipated d/c is to: Home              Anticipated d/c date is: 12/20/2020              Patient currently is not medically stable to d/c.   Difficult to place patient No  Family Communication: Sister was at bedside feeding pt during encounter today 1/28.   Spoke with son, [name], at length by phone 1/28 regarding patient's overall status, and hospice vs palliative care.  Advised he and family attempt to illicit patient's wishes regarding future hospital admissions and  how much aggressive medical care she wants for herself.  Advised pt is hospice appropriate, and having palliative on board will benefit transition to hospice when the time comes, but would recommend going home with hospice.  He would like to get patient as optimal as we can, and hopes she will again begin to thrive once home.  Consults, Procedures, Significant Events   Consultants:   Urology  Procedures:   None  Antimicrobials:  Anti-infectives (From admission, onward)   Start     Dose/Rate Route Frequency Ordered Stop   12/12/20 1000  remdesivir 100 mg in sodium chloride 0.9 % 100 mL IVPB       "Followed by" Linked Group Details   100 mg 200 mL/hr over 30 Minutes Intravenous Daily 12/11/20 1047 12/15/20 1204    12/11/20 1200  cefTRIAXone (ROCEPHIN) 1 g in sodium chloride 0.9 % 100 mL IVPB        1 g 200 mL/hr over 30 Minutes Intravenous Every 24 hours 12/10/20 2315 12/19/20 1240   12/11/20 1145  remdesivir 200 mg in sodium chloride 0.9% 250 mL IVPB       "Followed by" Linked Group Details   200 mg 580 mL/hr over 30 Minutes Intravenous Once 12/11/20 1047 12/11/20 1537   12/10/20 2215  cefTRIAXone (ROCEPHIN) 1 g in sodium chloride 0.9 % 100 mL IVPB        1 g 200 mL/hr over 30 Minutes Intravenous  Once 12/10/20 2211 12/11/20 0125        Objective   Vitals:   12/19/20 0444 12/19/20 0759 12/19/20 1123 12/19/20 1505  BP: (!) 142/76 (!) 106/56 124/60 (!) 108/52  Pulse: 71 97 99 71  Resp: 16 17 20 17   Temp: 98.2 F (36.8 C) 99.2 F (37.3 C) 98.6 F (37 C) 98.4 F (36.9 C)  TempSrc: Oral  Axillary   SpO2: 91% 97% 93% 96%  Weight:      Height:        Intake/Output Summary (Last 24 hours) at 12/19/2020 1748 Last data filed at 12/19/2020 1700 Gross per 24 hour  Intake 1389.11 ml  Output 1100 ml  Net 289.11 ml   Filed Weights   12/10/20 1153  Weight: 43.1 kg    Physical Exam:  General exam: tearful, no acute distress, cachectic and chronically ill-appearing HEENT: Temporal wasting, moist mucus membranes, hearing grossly normal  Respiratory system: symmetric chest rise, normal respiratory effort, on room air. Cardiovascular system: RRR, no pedal edema.   Neuro: no gross focal deficits, normal speech Psychiatric: tearful, depressed mood, congruent affect    Labs   Data Reviewed: I have personally reviewed following labs and imaging studies  CBC: Recent Labs  Lab 12/13/20 0627 12/14/20 0403 12/15/20 0739 12/16/20 0643 12/17/20 0806 12/18/20 0308 12/19/20 0604  WBC 6.6 6.6 8.6 9.9 10.6* 11.3* 14.0*  NEUTROABS 4.9 5.3 6.7 8.0*  --   --   --   HGB 10.4* 9.1* 9.7* 9.7* 10.0* 9.3* 8.5*  HCT 32.7* 28.1* 31.5* 31.5* 31.4* 28.5* 27.8*  MCV 93.7 93.0 98.4 97.5 94.9 93.8 98.2   PLT 370 312 311 279 311 300 [id number]   Basic Metabolic Panel: Recent Labs  Lab 12/13/20 0627 12/14/20 0403 12/15/20 0739 12/16/20 0643 12/17/20 0806 12/18/20 0308 12/19/20 0604  NA 143 145 147* 148* 147* 150* 146*  K 5.0 4.3 4.5 4.7 4.8 4.6 4.4  CL 114* 116* 120* 120* 121* 123* 116*  CO2 19* 18* 17* 19* 20* 21* 24  GLUCOSE  94 107* 95 79 93 124* 74  BUN 89* 91* 81* 67* 60* 55* 48*  CREATININE 2.44* 2.16* 1.73* 1.53* 1.51* 1.34* 1.17*  CALCIUM 8.6* 8.4* 8.4* 8.6* 8.4* 8.4* 8.3*  MG 2.2 2.1 2.2 2.3  --   --  2.1  PHOS 4.0 3.5 2.3* 2.4*  --  2.2* 2.1*   GFR: Estimated Creatinine Clearance: 25.2 mL/min (A) (by C-G formula based on SCr of 1.17 mg/dL (H)). Liver Function Tests: Recent Labs  Lab 12/13/20 0627 12/14/20 0403 12/15/20 0739 12/16/20 0643  AST 15 12* 17 16  ALT 8 8 9 10   ALKPHOS 56 53 54 56  BILITOT 0.5 0.4 0.3 0.3  PROT 6.0* 5.5* 5.5* 5.3*  ALBUMIN 2.1* 2.1* 2.1* 2.2*   No results for input(s): LIPASE, AMYLASE in the last 168 hours. No results for input(s): AMMONIA in the last 168 hours. Coagulation Profile: No results for input(s): INR, PROTIME in the last 168 hours. Cardiac Enzymes: No results for input(s): CKTOTAL, CKMB, CKMBINDEX, TROPONINI in the last 168 hours. BNP (last 3 results) No results for input(s): PROBNP in the last 8760 hours. HbA1C: No results for input(s): HGBA1C in the last 72 hours. CBG: No results for input(s): GLUCAP in the last 168 hours. Lipid Profile: No results for input(s): CHOL, HDL, LDLCALC, TRIG, CHOLHDL, LDLDIRECT in the last 72 hours. Thyroid Function Tests: No results for input(s): TSH, T4TOTAL, FREET4, T3FREE, THYROIDAB in the last 72 hours. Anemia Panel: No results for input(s): VITAMINB12, FOLATE, FERRITIN, TIBC, IRON, RETICCTPCT in the last 72 hours. Sepsis Labs: Recent Labs  Lab 12/13/20 0627  PROCALCITON 0.63    Recent Results (from the past 240 hour(s))  SARS CORONAVIRUS 2 (TAT 6-24 HRS) Nasopharyngeal  Nasopharyngeal Swab     Status: Abnormal   Collection Time: 12/10/20  9:21 PM   Specimen: Nasopharyngeal Swab  Result Value Ref Range Status   SARS Coronavirus 2 POSITIVE (A) NEGATIVE Final    Comment: (NOTE) SARS-CoV-2 target nucleic acids are DETECTED.  The SARS-CoV-2 RNA is generally detectable in upper and lower respiratory specimens during the acute phase of infection. Positive results are indicative of the presence of SARS-CoV-2 RNA. Clinical correlation with patient history and other diagnostic information is  necessary to determine patient infection status. Positive results do not rule out bacterial infection or co-infection with other viruses.  The expected result is Negative.  Fact Sheet for Patients: SugarRoll.be  Fact Sheet for Healthcare Providers: https://www.woods-mathews.com/  This test is not yet approved or cleared by the [place] FDA and  has been authorized for detection and/or diagnosis of SARS-CoV-2 by FDA under an Emergency Use Authorization (EUA). This EUA will remain  in effect (meaning this test can be used) for the duration of the COVID-19 declaration under Section 564(b)(1) of the Act, 21 U. S.C. section 360bbb-3(b)(1), unless the authorization is terminated or revoked sooner.   Performed at [facility], [place] [address], [place], [place] [address]   Urine Culture     Status: Abnormal   Collection Time: 12/10/20  9:21 PM   Specimen: Urine, Random  Result Value Ref Range Status   Specimen Description   Final    URINE, RANDOM Performed at [facility], [address], [place], [place] [address]    Special Requests   Final    NONE Performed at [facility], [address] [place], [place] [address]    Culture MULTIPLE SPECIES PRESENT, SUGGEST RECOLLECTION (A)  Final   Report  Status 12/12/2020 FINAL  Final      Imaging Studies   No results found.   Medications    Scheduled Meds: . apixaban  2.5 mg Oral BID  . Chlorhexidine Gluconate Cloth  6 each Topical Daily  . dexamethasone (DECADRON) injection  6 mg Intravenous Daily  . feeding supplement  237 mL Oral BID BM  . fluticasone furoate-vilanterol  1 puff Inhalation Daily  . levothyroxine  150 mcg Oral Daily  . metoprolol tartrate  25 mg Oral BID  . midodrine  2.5 mg Oral TID WC  . multivitamin with minerals  1 tablet Oral Daily  . potassium & sodium phosphates  1 packet Oral TID WC & HS   Continuous Infusions: . dextrose 100 mL/hr at 12/18/20 0908       LOS: 8 days    Time spent: 25 minutes with greater than 50% spent at bedside in coordination of care    Ezekiel Slocumb, DO [facility]  12/19/2020, 5:48 PM    If 7PM-7AM, please contact night-coverage. How to contact the [facility] Attending or Consulting provider [place] or covering provider during after hours [place], for this patient?    1. Check the care team in [facility] and look for a) attending/consulting [facility] provider listed and b) the [facility] team listed 2. Log into [email or web address] and use [facility]'s universal password to access. If you do not have the password, please contact the hospital operator. 3. Locate the [facility] provider you are looking for under [facility] and page to a number that you can be directly reached. 4. If you still have difficulty reaching the provider, please page the [facility] (Director on Call) for the Hospitalists listed on amion for assistance.

## 2020-12-19 NOTE — Consult Note (Addendum)
PHARMACY CONSULT NOTE - FOLLOW UP  Pharmacy Consult for Electrolyte Monitoring and Replacement   Recent Labs: Potassium (mmol/L)  Date Value  12/19/2020 4.4  01/03/2014 3.6   Magnesium (mg/dL)  Date Value  12/19/2020 2.1   Calcium (mg/dL)  Date Value  12/19/2020 8.3 (L)   Calcium, Total (mg/dL)  Date Value  01/03/2014 10.2 (H)   Albumin (g/dL)  Date Value  12/16/2020 2.2 (L)  07/21/2020 3.7   Phosphorus (mg/dL)  Date Value  12/19/2020 2.1 (L)   Sodium (mmol/L)  Date Value  12/19/2020 146 (H)  09/03/2020 137  01/03/2014 134 (L)   Corrected calcium 9.74  Assessment: 80 yo female with PMH COPD (on 2L chronic O2), PAF (on Eliquis), CVA (residual L side weakness), 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    Scr improving 1.51>1.34>1.17.  Pharmacy has been consulted to monitor and replenish electrolytes.  Goal of Therapy:  Electrolytes wnl's  Plan:  Phos 2.1 - Will dose phosnak packets x 2 and recheck phosphorous with am labs.  No other electrolyte replenishment warranted at this time.  Lu Duffel ,PharmD Clinical Pharmacist 12/19/2020 9:28 AM

## 2020-12-19 NOTE — Progress Notes (Signed)
A consult was placed to the IV Therapist; pt needing a new peripheral iv;  Pt is non-cooperative, yelling; had a tech come in to assist; vein bruised and infiltrated immediately;  No other attempts made as pt is just not cooperative; multiple bruises noted on both hands, and arms.  Very poor access.

## 2020-12-20 LAB — PHOSPHORUS: Phosphorus: 3 mg/dL (ref 2.5–4.6)

## 2020-12-20 LAB — BASIC METABOLIC PANEL
Anion gap: 8 (ref 5–15)
BUN: 42 mg/dL — ABNORMAL HIGH (ref 8–23)
CO2: 23 mmol/L (ref 22–32)
Calcium: 8.2 mg/dL — ABNORMAL LOW (ref 8.9–10.3)
Chloride: 106 mmol/L (ref 98–111)
Creatinine, Ser: 1.19 mg/dL — ABNORMAL HIGH (ref 0.44–1.00)
GFR, Estimated: 46 mL/min — ABNORMAL LOW (ref >60)
Glucose, Bld: 100 mg/dL — ABNORMAL HIGH (ref 70–99)
Potassium: 4.5 mmol/L (ref 3.5–5.1)
Sodium: 137 mmol/L (ref 135–145)

## 2020-12-20 LAB — FIBRIN DERIVATIVES D-DIMER (ARMC ONLY): Fibrin derivatives D-dimer (ARMC): 767.34 ng/mL (FEU) — ABNORMAL HIGH (ref 0.00–499.00)

## 2020-12-20 MED ORDER — FLUTICASONE FUROATE-VILANTEROL 200-25 MCG/INH IN AEPB: 1.0000 | INHALATION_SPRAY | Freq: Every day | RESPIRATORY_TRACT | Status: AC

## 2020-12-20 MED ORDER — MIDODRINE HCL 2.5 MG PO TABS: 2.5000 mg | ORAL_TABLET | Freq: Two times a day (BID) | ORAL | 1 refills | Status: AC

## 2020-12-20 MED ORDER — METOPROLOL TARTRATE 25 MG PO TABS: 25.0000 mg | ORAL_TABLET | Freq: Two times a day (BID) | ORAL | 1 refills | Status: AC

## 2020-12-20 MED ORDER — ALBUTEROL SULFATE HFA 108 (90 BASE) MCG/ACT IN AERS: 2.0000 | INHALATION_SPRAY | Freq: Four times a day (QID) | RESPIRATORY_TRACT | 1 refills | Status: AC | PRN

## 2020-12-20 MED ORDER — ADULT MULTIVITAMIN W/MINERALS CH: 1.0000 | ORAL_TABLET | Freq: Every day | ORAL | Status: AC

## 2020-12-20 MED ORDER — TRAMADOL HCL 50 MG PO TABS
50.0000 mg | ORAL_TABLET | Freq: Four times a day (QID) | ORAL | 0 refills | Status: AC | PRN
Start: 2020-12-20 — End: 2020-12-25

## 2020-12-20 MED ORDER — ENSURE ENLIVE PO LIQD: 237.0000 mL | Freq: Two times a day (BID) | ORAL | 12 refills | Status: AC

## 2020-12-20 NOTE — Progress Notes (Signed)
Pt's oxygen saturation is as follows:  Room air, at rest: 97% Room air, dangling EOB with exertion: 94% 2L [place], dangling EOB with extertion: 100%  Pt was dangled edged of bed with assistance as patient has not been ambulatory since September 21, 2020, per patient's sister.

## 2020-12-20 NOTE — Progress Notes (Signed)
Foley removed per order of MD. Pt tolerated well. Cannula intact.

## 2020-12-20 NOTE — Plan of Care (Signed)
  Problem: Education: Goal: Knowledge of General Education information will improve Description: Including pain rating scale, medication(s)/side effects and non-pharmacologic comfort measures 12/20/2020 1100 by Cristela Blue, RN Outcome: Adequate for Discharge 12/20/2020 1100 by Cristela Blue, RN Outcome: Adequate for Discharge   Problem: Health Behavior/Discharge Planning: Goal: Ability to manage health-related needs will improve 12/20/2020 1100 by Cristela Blue, RN Outcome: Adequate for Discharge 12/20/2020 1100 by Cristela Blue, RN Outcome: Adequate for Discharge   Problem: Clinical Measurements: Goal: Ability to maintain clinical measurements within normal limits will improve 12/20/2020 1100 by Cristela Blue, RN Outcome: Adequate for Discharge 12/20/2020 1100 by Cristela Blue, RN Outcome: Adequate for Discharge Goal: Will remain free from infection 12/20/2020 1100 by Cristela Blue, RN Outcome: Adequate for Discharge 12/20/2020 1100 by Cristela Blue, RN Outcome: Adequate for Discharge Goal: Diagnostic test results will improve 12/20/2020 1100 by Cristela Blue, RN Outcome: Adequate for Discharge 12/20/2020 1100 by Cristela Blue, RN Outcome: Adequate for Discharge Goal: Respiratory complications will improve 12/20/2020 1100 by Cristela Blue, RN Outcome: Adequate for Discharge 12/20/2020 1100 by Cristela Blue, RN Outcome: Adequate for Discharge Goal: Cardiovascular complication will be avoided 12/20/2020 1100 by Cristela Blue, RN Outcome: Adequate for Discharge 12/20/2020 1100 by Cristela Blue, RN Outcome: Adequate for Discharge   Problem: Activity: Goal: Risk for activity intolerance will decrease 12/20/2020 1100 by Cristela Blue, RN Outcome: Adequate for Discharge 12/20/2020 1100 by Cristela Blue, RN Outcome: Adequate for Discharge   Problem: Nutrition: Goal: Adequate nutrition will be maintained 12/20/2020 1100 by Cristela Blue, RN Outcome: Adequate for  Discharge 12/20/2020 1100 by Cristela Blue, RN Outcome: Adequate for Discharge   Problem: Coping: Goal: Level of anxiety will decrease 12/20/2020 1100 by Cristela Blue, RN Outcome: Adequate for Discharge 12/20/2020 1100 by Cristela Blue, RN Outcome: Adequate for Discharge   Problem: Elimination: Goal: Will not experience complications related to bowel motility 12/20/2020 1100 by Cristela Blue, RN Outcome: Adequate for Discharge 12/20/2020 1100 by Cristela Blue, RN Outcome: Adequate for Discharge Goal: Will not experience complications related to urinary retention 12/20/2020 1100 by Cristela Blue, RN Outcome: Adequate for Discharge 12/20/2020 1100 by Cristela Blue, RN Outcome: Adequate for Discharge   Problem: Pain Managment: Goal: General experience of comfort will improve 12/20/2020 1100 by Cristela Blue, RN Outcome: Adequate for Discharge 12/20/2020 1100 by Cristela Blue, RN Outcome: Adequate for Discharge   Problem: Safety: Goal: Ability to remain free from injury will improve 12/20/2020 1100 by Cristela Blue, RN Outcome: Adequate for Discharge 12/20/2020 1100 by Cristela Blue, RN Outcome: Adequate for Discharge   Problem: Skin Integrity: Goal: Risk for impaired skin integrity will decrease 12/20/2020 1100 by Cristela Blue, RN Outcome: Adequate for Discharge 12/20/2020 1100 by Cristela Blue, RN Outcome: Adequate for Discharge

## 2020-12-20 NOTE — Progress Notes (Signed)
This RN provided discharge instructions and teaching to the patient and the patient's sister. The patient and the patient's sister verbalized and demonstrated understanding of the provided instructions. All outstanding questions resolved. R FA PIV removed. Cannula intact. Pt tolerated well. Pt dressed. All belongings packed and in tow. Pt to be discharged home via EMS.

## 2020-12-20 NOTE — Discharge Summary (Signed)
Physician Discharge Summary  DELMI FULFER IFO:[id number] DOB: Aug 19, 1941 DOA: 12/10/2020  PCP: Valerie Roys, DO  Admit date: 12/10/2020 Discharge date: 12/20/2020  Admitted From: home (less than 24 hrs after d/c from SNF) Disposition:  home  Recommendations for Outpatient Follow-up:  1. Follow up with PCP in 1-2 weeks 2. Please obtain BMP/CBC in one week 3. Please follow up on patient's sacral wound/s 4. Follow up with palliative care and continue discussions regarding patient's goals of care and wishes   Home Health: PT, OT, RN, Aide, SW  Equipment/Devices: already has   Discharge Condition: Stable  CODE STATUS: DNR  Diet recommendation: Dysphagia-1 (pureed), thin liquids, nutritional supplements (Ensure etc)  Discharge Diagnoses: Principal Problem:   Acute renal failure superimposed on stage 3a chronic kidney disease ([place]) Active Problems:   Hypothyroidism   Hypertension   COPD (chronic obstructive pulmonary disease) ([place])   Pressure injury of skin   Atrial fibrillation ([place])   COVID-19   Acute cystitis   Normocytic anemia    Summary of HPI and Hospital Course:  80 yo female with PMH COPD (on 2L chronic O2), PAF (on Eliquis), CVA (residual L side weakness), 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    Hospital admission 10/06/2020 until 10/21/2020 for abdominal pain which ultimately resolved on its own.  She had been seen by GI and general surgery during that hospitalization as well.  She was discharged to [facility].  On admission, COVID PCR positive.  Family reported pt had tested positive for COVID on 11/16/2020 at [facility].  Evaluation in the ED - UA concerning for possible infection and was started on Rocephin; urine culture was sent.  CT head was unremarkable for acute  findings.  CT renal stone showed stable but severe left-sided hydronephrosis with left sided Endo ureteral stent in place.  Urinary bladder wall thickening possibly due to cystitis.  And right basilar atelectasis/infiltrate.  CXR also was relatively clear except for the mild linear atelectatic changes appreciated in the right base that were seen on CT as well.  Admitted for antibiotics, fluid resuscitation, and treatment for COVID-19 infection.  She was started on Decadron and remdesivir.    Palliative care was consulted and hospice was discussed.  Family wanted to have patient as optimized as possible and get her home to see if she would again begin to thrive, as she was not even home 24 hours from rehab before this admission.  They feel she may improve once back home for longer this time.  Hospice is appropriate and family understand, agreeable to palliative following after discharge for support and eventual transition to hospice at some point should patient continue to decline.       Hypernatremia -due to poor p.o. intake, not enough water intake.  Treated with D5W until patient lose her IV and refused to have one replaced.  Family are supportive of encouraging patient to drink water. --Encourage patient to drink water frequently  Hypophosphatemia -1/28, 1/29 being replaced.  Monitor with poor nutrition status.  AKI superimposed on CKD stage 3a -POA, Resolved.   Due to obstructive uropathy as outlined below in addition to prerenal azotemia.  Baseline creatinine approximately 1.2-1.3, presented with creatinine 1.96. Renal function improved with IV hydration.  Chronic Lt UPJ obstruction - Ureteral stent initially placed on 10/08/2020 by Dr. Diamantina Providence in the  setting of solitary kidney, chronic left UPJ obstruction and UTI.  Subsequently seen as an outpatient on 10/2021/21, and was deemed a poor surgical candidate for definitive reconstruction. Renal pelvic diameter pre and post stent have  improved from 9 cm to 6 cm, suggestive that stent is functional. --Urology consulted, input appreciated - recommended Foley to maximize bladder emptying in addition to hydration 1/25 urology recommended DC Foley as creatinine was trending down.  Foley should be replaced if creatinine again trends upward. Per urology, treated with empiric Rocephin for total 10 to 14 days. Pt completed antibiotics during admission.  Acute cystitis - Hx of recent UTI in November 2021, and left UPJ obstruction requiring ureteral stent placement on 10/08/2020. Urine culture obtained on admission grew multiple species. Continued on empiric antibiotics per urology, course completed during admittion. Due to chronic stent, patient likely has colonization, monitor for symptoms closely   Chronic respiratory failure with hypoxia due to COPD -uses 2 L/min nasal cannula oxygen at baseline. COPD not currently exacerbated, no wheezing on exam.   Chest x-ray relatively clear. Treated with steroids for Covid as below Continued on home regimen for COPD Respiratory status remained stable.  Atrial fibrillation - Rate controlled.  Beta-blocker was previously stopped due to hypotension, and patient was started on midodrine.  BP improved this admission, metoprolol was resumed with hold parameters.   --Continue Eliquis and metoprolol --Monitor heart rate and blood pressure closely  COVID-19 infection -tested positive on 11/16/2020.  Patient is vaccinated and received a booster.  She tested positive at SNF but was not treated with anything.  Still positive on admission here.  Due to lethargy, borderline low oxygen saturation, underlying COPD and hypoxia in addition to other comorbidities, patient felt very high risk for progression to severe illness.  She was started on treatment for Covid with dexamethasone and remdesivir --Completed both remdesivir dexamethasone Pt remained stable and minimally symptomatic,  improved.  Hyperkalemia - Resolved with dose of Kayexalate. Remains stable BMP in follow up.  Hypothyroidism - takes Synthroid 150 mcg at home.  TSH 11.746 on admission which may be also reactive given acute illness. Free T4 is actually very mildly elevated, 1.18 Needs repeat TSH in 4 to 6 weeks Continue current dose Synthroid  Normocytic anemia - Baseline hemoglobin ~9 to 10 g/dL. Stable and at baseline.   Hypertension - lisinopril on hold due to AKI.  Beta blocker resumed.  Blood pressures have been controlled for the most part and soft at times.  Severe protein calorie malnutrition / Underweight -  Patient BMI: Body mass index is 14.44 kg/m.  --Palliative care consulted -to follow at home after discharge --Dietitian was consulted.  --Continue nutritional supplements and vitamins at home.   Discharge Instructions   Discharge Instructions    Call MD for:  extreme fatigue   Complete by: As directed    Call MD for:  persistant dizziness or light-headedness   Complete by: As directed    Call MD for:  persistant nausea and vomiting   Complete by: As directed    Call MD for:  severe uncontrolled pain   Complete by: As directed    Call MD for:  temperature >100.4   Complete by: As directed    Discharge instructions   Complete by: As directed    Follow up with Palliative Care team for symptom management, including any uncontrolled pain. They will help to guide Marlissa's care going forward, and assist in making decisions on overall goals of care.  I recommend continuing talks with Daphnee about what her wishes are regarding her medical care.   - Does she want to continue to return to the hospital for aggressive medical care or interventions if she gets sick? - Or would she prefer to stay home and focus on quality of life, her comfort, and spend her time with family in the comfort of her home?  Kiylee is extremely malnourished and frail, so focusing on nutrition and hydration  will be very important in order to give her the best chance to begin to thrive again.   Discharge wound care:   Complete by: As directed    Routine sacral wound care and close monitoring.  Clean area daily, or as needed for soiling.  Reposition at least every two hours.   Increase activity slowly   Complete by: As directed      Allergies as of 12/20/2020      Reactions   Statins Other (See Comments)   Myalgias   Zetia [ezetimibe] Other (See Comments)   myalgias      Medication List    STOP taking these medications   lisinopril 10 MG tablet Commonly known as: ZESTRIL     TAKE these medications   acetaminophen 325 MG tablet Commonly known as: TYLENOL Take 2 tablets (650 mg total) by mouth every 6 (six) hours as needed for mild pain (or Fever >/= 101).   albuterol 108 (90 Base) MCG/ACT inhaler Commonly known as: VENTOLIN HFA Inhale 2 puffs into the lungs every 6 (six) hours as needed for wheezing or shortness of breath.   apixaban 2.5 MG Tabs tablet Commonly known as: ELIQUIS Take 1 tablet (2.5 mg total) by mouth 2 (two) times daily.   feeding supplement Liqd Take 237 mLs by mouth 2 (two) times daily between meals.   fluticasone furoate-vilanterol 200-25 MCG/INH Aepb Commonly known as: BREO ELLIPTA Inhale 1 puff into the lungs daily.   levothyroxine 150 MCG tablet Commonly known as: SYNTHROID Take 1 tablet (150 mcg total) by mouth daily.   magnesium oxide 400 (241.3 Mg) MG tablet Commonly known as: MAG-OX Take 1 tablet (400 mg total) by mouth 2 (two) times daily.   metoprolol tartrate 25 MG tablet Commonly known as: LOPRESSOR Take 1 tablet (25 mg total) by mouth 2 (two) times daily. What changed:   medication strength  how much to take   midodrine 2.5 MG tablet Commonly known as: PROAMATINE Take 1 tablet (2.5 mg total) by mouth 2 (two) times daily with a meal.   multivitamin with minerals Tabs tablet Take 1 tablet by mouth daily.   ondansetron 4 MG  tablet Commonly known as: Zofran Take 1 tablet (4 mg total) by mouth every 8 (eight) hours as needed for nausea or vomiting.   pantoprazole 40 MG tablet Commonly known as: PROTONIX Take 40 mg by mouth daily.   traMADol 50 MG tablet Commonly known as: ULTRAM Take 1 tablet (50 mg total) by mouth every 6 (six) hours as needed for up to 5 days for moderate pain or severe pain.            Discharge Care Instructions  (From admission, onward)         Start     Ordered   12/20/20 0000  Discharge wound care:       Comments: Routine sacral wound care and close monitoring.  Clean area daily, or as needed for soiling.  Reposition at least every two hours.   12/20/20 [id number]  Allergies  Allergen Reactions  . Statins Other (See Comments)    Myalgias  . Zetia [Ezetimibe] Other (See Comments)    myalgias     If you experience worsening of your admission symptoms, develop shortness of breath, life threatening emergency, suicidal or homicidal thoughts you must seek medical attention immediately by calling 911 or calling your MD immediately  if symptoms less severe.    Please note   You were cared for by a hospitalist during your hospital stay. If you have any questions about your discharge medications or the care you received while you were in the hospital after you are discharged, you can call the unit and asked to speak with the hospitalist on call if the hospitalist that took care of you is not available. Once you are discharged, your primary care physician will handle any further medical issues. Please note that NO REFILLS for any discharge medications will be authorized once you are discharged, as it is imperative that you return to your primary care physician (or establish a relationship with a primary care physician if you do not have one) for your aftercare needs so that they can reassess your need for medications and monitor your lab  values.   Consultations:  Urology  Palliative care   Procedures/Studies: DG Chest 1 View  Result Date: 12/10/2020 CLINICAL DATA:  Right basilar infiltrate on recent CT EXAM: CHEST  1 VIEW COMPARISON:  CT from earlier in the same day. FINDINGS: Cardiac shadow is within normal limits. Aortic calcifications are seen. Bilateral nipple shadows are noted. Mild linear atelectatic changes are noted in the right base similar to that seen on the prior CT examination. Lead is noted over the upper right chest wall. IMPRESSION: Linear atelectatic changes in the right base similar to that seen on prior CT examination. No other focal abnormality is noted. Electronically Signed   By: Inez Catalina M.D.   On: 12/10/2020 22:27   CT Head Wo Contrast  Result Date: 12/10/2020 CLINICAL DATA:  Mental status change EXAM: CT HEAD WITHOUT CONTRAST TECHNIQUE: Contiguous axial images were obtained from the base of the skull through the vertex without intravenous contrast. COMPARISON:  None. FINDINGS: Brain: No evidence of acute territorial infarction, hemorrhage, hydrocephalus,extra-axial collection or mass lesion/mass effect. There is dilatation the ventricles and sulci consistent with age-related atrophy. Low-attenuation changes in the deep white matter consistent with small vessel ischemia. Vascular: No hyperdense vessel or unexpected calcification. Skull: The skull is intact. No fracture or focal lesion identified. Sinuses/Orbits: The visualized paranasal sinuses and mastoid air cells are clear. The orbits and globes intact. Other: None IMPRESSION: No acute intracranial abnormality. Findings consistent with age related atrophy and chronic small vessel ischemia Electronically Signed   By: Prudencio Pair M.D.   On: 12/10/2020 20:58   CT Renal Stone Study  Result Date: 12/10/2020 CLINICAL DATA:  Burning with urination. EXAM: CT ABDOMEN AND PELVIS WITHOUT CONTRAST TECHNIQUE: Multidetector CT imaging of the abdomen and pelvis  was performed following the standard protocol without IV contrast. COMPARISON:  October 13, 2020 FINDINGS: Lower chest: Mild atelectasis and/or infiltrate is seen within the posterior aspect of the right lung base. Hepatobiliary: No focal liver abnormality is seen. No gallstones, gallbladder wall thickening, or biliary dilatation. The gallstone seen within the gallbladder lumen on the prior study is not visualized on the current exam. Pancreas: Unremarkable. No pancreatic ductal dilatation or surrounding inflammatory changes. Spleen: Normal in size without focal abnormality. Adrenals/Urinary Tract: Adrenal glands are unremarkable. The  right kidney is absent. The left kidney is normal in size. A left-sided endo ureteral stent is seen and is unchanged in position. Stable marked severity left-sided hydronephrosis is also noted. Asymmetric moderate to marked severity lateral urinary bladder wall thickening is seen, right greater than left. Stomach/Bowel: Stomach is within normal limits. The appendix is not identified. No evidence of bowel wall thickening, distention, or inflammatory changes. Vascular/Lymphatic: Aortic atherosclerosis. No enlarged abdominal or pelvic lymph nodes. Reproductive: Status post hysterectomy. No adnexal masses. Other: No abdominal wall hernia or abnormality. The small right inguinal hernia is seen on the prior study is no longer present. No abdominopelvic ascites. Musculoskeletal: Multilevel degenerative changes seen throughout the lumbar spine. Marked severity dextroscoliosis is also seen. IMPRESSION: 1. Mild right basilar atelectasis and/or infiltrate. 2. Stable marked severity left-sided hydronephrosis with a left-sided endo ureteral stent in place. 3. Asymmetric moderate to marked severity urinary bladder wall thickening which may represent sequelae associated with cystitis. Correlation with urinalysis is recommended to exclude the presence of an underlying neoplastic process. 4. Aortic  atherosclerosis. Aortic Atherosclerosis (ICD10-I70.0). Electronically Signed   By: Virgina Norfolk M.D.   On: 12/10/2020 21:04       Subjective: Pt again tearful when I speak with her today.  She has wanted to go home every day I've seen her, today says she does not want to go home.  When discussing drinking water and trying to eat or drink supplements she cries and tells me to please leave.  Sister at bedside supporting patient.    Spoke with son this AM by phone about discharge plan and recommendations, questions answered.  Discharge Exam: Vitals:   12/20/20 0009 12/20/20 0832  BP: (!) 141/82 (!) 161/86  Pulse: 66 (!) 59  Resp:  18  Temp: 97.8 F (36.6 C) 98.6 F (37 C)  SpO2: 98% 96%   Vitals:   12/19/20 1505 12/19/20 2132 12/20/20 0009 12/20/20 0832  BP: (!) 108/52 138/80 (!) 141/82 (!) 161/86  Pulse: 71 (!) 52 66 (!) 59  Resp: 17   18  Temp: 98.4 F (36.9 C)  97.8 F (36.6 C) 98.6 F (37 C)  TempSrc:   Oral   SpO2: 96% 94% 98% 96%  Weight:      Height:        General: Pt is alert, awake, not in acute distress, cachectic, tearful Cardiovascular: RRR, S1/S2 +, no rubs, no gallops Respiratory: CTA bilaterally, no wheezing, no rhonchi Abdominal: Soft, NT, ND, bowel sounds + Extremities: no edema, no cyanosis    The results of significant diagnostics from this hospitalization (including imaging, microbiology, ancillary and laboratory) are listed below for reference.     Microbiology: Recent Results (from the past 240 hour(s))  SARS CORONAVIRUS 2 (TAT 6-24 HRS) Nasopharyngeal Nasopharyngeal Swab     Status: Abnormal   Collection Time: 12/10/20  9:21 PM   Specimen: Nasopharyngeal Swab  Result Value Ref Range Status   SARS Coronavirus 2 POSITIVE (A) NEGATIVE Final    Comment: (NOTE) SARS-CoV-2 target nucleic acids are DETECTED.  The SARS-CoV-2 RNA is generally detectable in upper and lower respiratory specimens during the acute phase of infection.  Positive results are indicative of the presence of SARS-CoV-2 RNA. Clinical correlation with patient history and other diagnostic information is  necessary to determine patient infection status. Positive results do not rule out bacterial infection or co-infection with other viruses.  The expected result is Negative.  Fact Sheet for Patients: SugarRoll.be  Fact Sheet for Healthcare  Providers: https://www.woods-mathews.com/  This test is not yet approved or cleared by the [place] and  has been authorized for detection and/or diagnosis of SARS-CoV-2 by FDA under an Emergency Use Authorization (EUA). This EUA will remain  in effect (meaning this test can be used) for the duration of the COVID-19 declaration under Section 564(b)(1) of the Act, 21 U. S.C. section 360bbb-3(b)(1), unless the authorization is terminated or revoked sooner.   Performed at [facility], [place] [address] [place], [place] [address]   Urine Culture     Status: Abnormal   Collection Time: 12/10/20  9:21 PM   Specimen: Urine, Random  Result Value Ref Range Status   Specimen Description   Final    URINE, RANDOM Performed at [facility], [address], [place], [place] [address]    Special Requests   Final    NONE Performed at [facility], [address] [place], [place] [address]    Culture MULTIPLE SPECIES PRESENT, SUGGEST RECOLLECTION (A)  Final   Report Status 12/12/2020 FINAL  Final     Labs: BNP (last 3 results) No results for input(s): BNP in the last 8760 hours. Basic Metabolic Panel: Recent Labs  Lab 12/14/20 0403 12/15/20 0739 12/16/20 [id number] 12/17/20 0806 12/18/20 0308 12/19/20 0604 12/20/20 0718  NA 145 147* 148* 147* 150* 146* 137  K 4.3 4.5 4.7 4.8 4.6 4.4 4.5  CL 116* 120* 120* 121* 123* 116* 106  CO2 18* 17* 19* 20* 21* 24 23  GLUCOSE 107* 95 79 93 124* 74 100*  BUN 91* 81* 67* 60* 55* 48* 42*   CREATININE 2.16* 1.73* 1.53* 1.51* 1.34* 1.17* 1.19*  CALCIUM 8.4* 8.4* 8.6* 8.4* 8.4* 8.3* 8.2*  MG 2.1 2.2 2.3  --   --  2.1  --   PHOS 3.5 2.3* 2.4*  --  2.2* 2.1* 3.0   Liver Function Tests: Recent Labs  Lab 12/14/20 0403 12/15/20 0739 12/16/20 0643  AST 12* 17 16  ALT 8 9 10   ALKPHOS 53 54 56  BILITOT 0.4 0.3 0.3  PROT 5.5* 5.5* 5.3*  ALBUMIN 2.1* 2.1* 2.2*   No results for input(s): LIPASE, AMYLASE in the last 168 hours. No results for input(s): AMMONIA in the last 168 hours. CBC: Recent Labs  Lab 12/14/20 0403 12/15/20 0739 12/16/20 0643 12/17/20 0806 12/18/20 0308 12/19/20 0604  WBC 6.6 8.6 9.9 10.6* 11.3* 14.0*  NEUTROABS 5.3 6.7 8.0*  --   --   --   HGB 9.1* 9.7* 9.7* 10.0* 9.3* 8.5*  HCT 28.1* 31.5* 31.5* 31.4* 28.5* 27.8*  MCV 93.0 98.4 97.5 94.9 93.8 98.2  PLT 312 311 279 311 300 259   Cardiac Enzymes: No results for input(s): CKTOTAL, CKMB, CKMBINDEX, TROPONINI in the last 168 hours. BNP: Invalid input(s): POCBNP CBG: No results for input(s): GLUCAP in the last 168 hours. D-Dimer No results for input(s): DDIMER in the last 72 hours. Hgb A1c No results for input(s): HGBA1C in the last 72 hours. Lipid Profile No results for input(s): CHOL, HDL, LDLCALC, TRIG, CHOLHDL, LDLDIRECT in the last 72 hours. Thyroid function studies No results for input(s): TSH, T4TOTAL, T3FREE, THYROIDAB in the last 72 hours.  Invalid input(s): FREET3 Anemia work up No results for input(s): VITAMINB12, FOLATE, FERRITIN, TIBC, IRON, RETICCTPCT in the last 72 hours. Urinalysis    Component Value Date/Time   COLORURINE YELLOW (A) 12/10/2020 2121   APPEARANCEUR TURBID (A) 12/10/2020 2121   APPEARANCEUR Cloudy (A)  01/04/2019 0928   LABSPEC 1.016 12/10/2020 2121   LABSPEC 1.010 01/03/2014 1104   PHURINE 7.0 12/10/2020 2121   GLUCOSEU NEGATIVE 12/10/2020 2121   GLUCOSEU Negative 01/03/2014 1104   HGBUR MODERATE (A) 12/10/2020 2121   BILIRUBINUR NEGATIVE 12/10/2020 2121    BILIRUBINUR Negative 01/04/2019 0928   BILIRUBINUR Negative 01/03/2014 1104   KETONESUR 5 (A) 12/10/2020 2121   PROTEINUR >=300 (A) 12/10/2020 2121   UROBILINOGEN 0.2 02/14/2013 1345   NITRITE NEGATIVE 12/10/2020 2121   LEUKOCYTESUR TRACE (A) 12/10/2020 2121   LEUKOCYTESUR Negative 01/03/2014 1104   Sepsis Labs Invalid input(s): PROCALCITONIN,  WBC,  LACTICIDVEN Microbiology Recent Results (from the past 240 hour(s))  SARS CORONAVIRUS 2 (TAT 6-24 HRS) Nasopharyngeal Nasopharyngeal Swab     Status: Abnormal   Collection Time: 12/10/20  9:21 PM   Specimen: Nasopharyngeal Swab  Result Value Ref Range Status   SARS Coronavirus 2 POSITIVE (A) NEGATIVE Final    Comment: (NOTE) SARS-CoV-2 target nucleic acids are DETECTED.  The SARS-CoV-2 RNA is generally detectable in upper and lower respiratory specimens during the acute phase of infection. Positive results are indicative of the presence of SARS-CoV-2 RNA. Clinical correlation with patient history and other diagnostic information is  necessary to determine patient infection status. Positive results do not rule out bacterial infection or co-infection with other viruses.  The expected result is Negative.  Fact Sheet for Patients: SugarRoll.be  Fact Sheet for Healthcare Providers: https://www.woods-mathews.com/  This test is not yet approved or cleared by the [place] FDA and  has been authorized for detection and/or diagnosis of SARS-CoV-2 by FDA under an Emergency Use Authorization (EUA). This EUA will remain  in effect (meaning this test can be used) for the duration of the COVID-19 declaration under Section 564(b)(1) of the Act, 21 U. S.C. section 360bbb-3(b)(1), unless the authorization is terminated or revoked sooner.   Performed at [facility], [place] [address], [place], [place] [address]   Urine Culture     Status: Abnormal   Collection Time: 12/10/20  9:21 PM    Specimen: Urine, Random  Result Value Ref Range Status   Specimen Description   Final    URINE, RANDOM Performed at [facility], [address] [place], [place] [address]    Special Requests   Final    NONE Performed at [facility], [address] [place], [place] [address]    Culture MULTIPLE SPECIES PRESENT, SUGGEST RECOLLECTION (A)  Final   Report Status 12/12/2020 FINAL  Final     Time coordinating discharge: Over 30 minutes  SIGNED:   Ezekiel Slocumb, DO [facility] 12/20/2020, 9:26 AM   If 7PM-7AM, please contact night-coverage [email or web address]\

## 2020-12-21 ENCOUNTER — Telehealth: Payer: Self-pay

## 2020-12-21 ENCOUNTER — Encounter: Payer: Self-pay | Admitting: Family Medicine

## 2020-12-21 DIAGNOSIS — N183 Chronic kidney disease, stage 3 unspecified: Secondary | ICD-10-CM | POA: Diagnosis not present

## 2020-12-21 NOTE — Telephone Encounter (Signed)
Per son [name]  Transition Care Management Follow-up Telephone Call  Date of discharge and from where: 12/20/2020 [facility]  How have you been since you were released from the hospital? Been crying a lot  Any questions or concerns? No  Items Reviewed:  Did the pt receive and understand the discharge instructions provided? No   Medications obtained and verified? Yes   Other? No   Any new allergies since your discharge? No   Dietary orders reviewed? Yes  Do you have support at home? Yes   Home Care and Equipment/Supplies: Were home health services ordered? no If so, what is the name of the agency? n/a  Has the agency set up a time to come to the patient's home? not applicable Were any new equipment or medical supplies ordered?  No What is the name of the medical supply agency? n/a Were you able to get the supplies/equipment? not applicable Do you have any questions related to the use of the equipment or supplies? No  Functional Questionnaire: (I = Independent and D = Dependent) ADLs: D  Bathing/Dressing- D  Meal Prep- D  Eating- D  Maintaining continence- D  Transferring/Ambulation- D  Managing Meds- D  Follow up appointments reviewed:   PCP Hospital f/u appt confirmed? Yes  Scheduled to see Dr. Wynetta Emery on 12/28/2020 @ 9:40.  Are transportation arrangements needed? No   If their condition worsens, is the pt aware to call PCP or go to the Emergency Dept.? Yes  Was the patient provided with contact information for the PCP's office or ED? Yes  Was to pt encouraged to call back with questions or concerns? Yes

## 2020-12-23 ENCOUNTER — Telehealth: Payer: Self-pay

## 2020-12-23 ENCOUNTER — Encounter: Payer: Self-pay | Admitting: Family Medicine

## 2020-12-23 ENCOUNTER — Telehealth: Payer: Self-pay | Admitting: General Practice

## 2020-12-23 NOTE — Telephone Encounter (Signed)
CAn we get her in any sooner? She has an appointment next week, but it sounds like she needs to be seen sooner.

## 2020-12-23 NOTE — Telephone Encounter (Signed)
Called and spoke with [place]. She states they are not requesting anything with the cath. They are requesting to be able to do a UA and culture on the patient to check for a UTI. States she had one in the hospital but is still having confusion and fatigue. They just wanted [place] to know that the urine would come from a straight cath.  Is it ok for them to check a urine sample on the patient?

## 2020-12-23 NOTE — Telephone Encounter (Signed)
Will need to call her urologist. We do not do straight caths

## 2020-12-23 NOTE — Telephone Encounter (Signed)
Called pt's sister [name] she states that pt is totally bedridden and they wouldn't be able to get a urine from the pt and can only do virtually. Is this ok? Please advise.  Copied from [place] [id number]. Topic: General - Other >> Dec 23, 2020  9:44 AM Tessa Lerner A wrote: Reason for CRM: Patient's home health caregiver has made contact requesting an order for Urine collection and testing in relation to a "possible UTI" Patient is incontinent and testing will need to be done via straight catheter

## 2020-12-23 NOTE — Telephone Encounter (Cosign Needed)
°  Chronic Care Management   Outreach Note  12/23/2020 Name: Cathy Solis MRN: [id number] DOB: 1941/09/30  Referred by: Valerie Roys, DO Reason for referral : Advice Only (RNCM: Call from [facility] at [facility] updating the [facility] that the patient was going to be admitted to Hospice once order received from Dr. Wynetta Emery)   Incoming call from [place] at [facility]. The patient has agreed to Hospice services and Hospice has spoken to the patients son. The patient has been discharged from the hospital and needs a higher level of care in the home. An order has been faxed over to [facility] for Dr. Wynetta Emery to sign and Hospice services will begin care for the patient.   Follow Up Plan: No further follow up required: patient has been referred to Hospice and CCM team management will close CCM services at that time.   Noreene Larsson RN, MSN, [facility] Mobile: [phone]

## 2020-12-23 NOTE — Telephone Encounter (Signed)
Called pt's sister [name] she states that pt is totally bedridden and they wouldn't be able to get a urine from the pt and can only do virtually. Is this ok? Please advise.

## 2020-12-24 ENCOUNTER — Telehealth: Payer: Self-pay | Admitting: Family Medicine

## 2020-12-24 NOTE — Telephone Encounter (Signed)
Home Health Verbal Orders - Caller/Agency: [facility]  Callback Number: [phone] option 2  Requesting OT/PT/Skilled Nursing/Social Work/Speech Therapy: requesting to continue palliative care services at home  Frequency:

## 2020-12-24 NOTE — Telephone Encounter (Signed)
OK for verbal orders?

## 2020-12-24 NOTE — Telephone Encounter (Signed)
Routing to provider for orders.  °

## 2020-12-25 ENCOUNTER — Ambulatory Visit: Payer: Self-pay | Admitting: Licensed Clinical Social Worker

## 2020-12-25 NOTE — Telephone Encounter (Signed)
Pt has apt on 12/28/2020

## 2020-12-25 NOTE — Chronic Care Management (AMB) (Signed)
Care Management   Follow Up Note   12/25/2020 Name: Cathy Solis MRN: [id number] DOB: 1940/12/02  Referred by: Valerie Roys, DO Reason for referral : Care Coordination   ROBERTA Solis is a 80 y.o. year old female who is a primary care patient of Valerie Roys, DO. The care management team was consulted for assistance with care management and care coordination needs.    Review of patient status, including review of consultants reports, relevant laboratory and other test results, and collaboration with appropriate care team members and the patient's provider was performed as part of comprehensive patient evaluation and provision of chronic care management services.     CCM LCSW completed care coordination with CCM RNCM nad was informed that she received incoming call from [facility]. The patient has agreed to Hospice services and Hospice has spoken to the patients son. The patient has been discharged from the hospital and needs a higher level of care in the home. An order has been faxed over to [facility] for Dr. Wynetta Emery to sign and Hospice services will begin care for the patient. CCM LCSW will close CCM referral and will complete case closure at this time due to Hospice involvement confirmation. CCM LCSW will also close out her social work goals. CCM team is available for follow up care coordination if needed.  Goals Addressed    .  COMPLETED: SW- "Cathy Solis is in the hospital and not doing well." (pt-stated)        [place] (see longitudinal plan of care for additional care plan information)  Current Barriers:  . Limited social support . Level of care concerns . Family and relationship dysfunction (patient resist help from family in fear of losing her independence) . Social Isolation . Limited access to caregiver . Cognitive Deficits . Memory Deficits  Clinical Social Work Clinical Goal(s):   [name] Over the next 120 days, patient/caregiver will work with SW to  address concerns related to care coordination needs, lack of a stable support network and lack of Brewing technologist. LCSW will assist patient in gaining additional support in order to maintain health and increase her level of support in the home as family is concerned for her well-being as she lives alone and has difficulty providing appropriate care to herself.  . Over the next 120 days, patient will demonstrate improved adherence to self care as evidenced by implementing healthy self-care into her daily routine such as: attending all medical appointments, deep breathing exercises, taking time for self-reflection, taking medications as prescribed, drinking water and daily exercise to improve mobility and mood. . Over the next 120 days, patient will demonstrate improved health management independence as evidenced by implementing healthy self-care skills and positive support/resources into her daily routine to help cope with stressors and improve overall health and well-being  . Over the next 120 days, patient or caregiver will verbalize basic understanding of depression/stress process and self health management plan as evidenced by her participation in development of long term plan of care and institution of self health management strategies  Interventions: . Inter-disciplinary care team collaboration (see longitudinal plan of care) . Patient interviewed and appropriate assessments performed. LCSW spoke with patient's sister [name] on 09/28/20.  [name] Patient went to the ED on 09/27/20 after having back pain and nausea for the past 3 days. Patient had been sitting in her recliner and had not been getting up much for the last 3 days even after family encouraged her  to go to the hospital. Patient would not allow her sisters to stay with her either even though she was unable to care for herself. Patient has stage 3 kidney disease and arrived at ED with foul smelling urine. Patient only has one  kidney. Patient has lost 13 pounds in the last 60 days. Per sister, patient has a kidney infection, kidney stones and urine infection. She shares that her son is going to the hospital today to visit patient. He is her POA. Family are concerned that patient has limited support within the home and lives alone. Family report that "she often fights [place]. She didn't even want her son to come to town." Patient is often non-compliant and is fearful of LTC placement.  . Patient smokes heavily per sister.  . Family report that patient sometimes is forgetful and suspect dementia. Patient's cognition level changes per day per family.  . Per sister, son has offered to pay out of pocket for an aide but she declined wanting someone in her home.  [name] LCSW educated family on petitioning the court system for guardianship and provided contact number for [employer] ([place]) . Provided patient with information about level of care resources including: Museum/gallery curator, PACE, ALF, SNF for rehab or long term, Adult Day Centers, [place], Private pay caregiving and [facility]. Family report that patient does not want to increase her level of care in fear of losing her independence.  . Discussed plans with patient for ongoing care management follow up and provided patient with direct contact information for care management team . Advised patient's caregiver to contact CCM team for any case management needs  . Sister is concerned that patient continues to drive. Sister states that she even almost run over herself one day because of her inability to drive safely.  . Assisted patient/caregiver with obtaining information about health plan benefits . Provided education to patient/caregiver regarding level of care options.  Patient Self Care Activities:  . Attends all scheduled provider appointments . Lacks social connections . Has a difficult time asking for help sometimes  Initial  goal documentation     .  COMPLETED: SW-Keeping Myself/Loved One Safe        Timeframe:  Long-Range Goal Priority:  Medium Start Date:  11/23/20                           Expected End Date:  01/21/21                    Follow Up Date - 90 days from 11/23/20   - avoid busy places that are confusing such as the shopping malls - check hot water heater temperature, turn down if too hot - get rid of poisonous plants - keep medicine where it is safe or locked - keep power tools, knives and cleaning products in a safe place - put deadbolts either high or low on outside doors - put car keys out of sight - remove knobs from stove and oven - remove vitamins, medicine, sugar substitutes and seasonings from the kitchen table and counters - supervise the use of tobacco and alcohol - use a GPS device in the car - use an audio or video monitor - use appliances that have an auto shut-off feature - use motion-sensor alarms outdoors, in the bedroom and the kitchen - use seat cushions, floor mats and bed pads that are wired to alarm  when getting up    Why is this important?    Safety is important when you or your loved one has dementia or memory loss.   Eyesight, hearing and changes in feelings of hot and cold or depth-perception may occur.   Wandering or getting lost may happen.   You/your loved one may have to stop driving.   Taking steps to improve safety can prevent injuries.   It will also help you/your loved one feel more relaxed.   It will help you/your loved one be independent for a longer time.     CARE PLAN ENTRY (see longitudinal plan of care for additional care plan information)  Current Barriers:  . Limited social support . Level of care concerns . Family and relationship dysfunction (patient resist help from family in fear of losing her independence) . Social Isolation . Limited access to caregiver . Cognitive Deficits . Memory Deficits  Clinical Social Work Clinical  Goal(s):   [name] Over the next 120 days, patient/caregiver will work with SW to address concerns related to care coordination needs, lack of a stable support network and lack of Brewing technologist. LCSW will assist patient in gaining additional support in order to maintain health and increase her level of support in the home as family is concerned for her well-being as she lives alone and has difficulty providing appropriate care to herself.  . Over the next 120 days, patient will demonstrate improved adherence to self care as evidenced by implementing healthy self-care into her daily routine such as: attending all medical appointments, deep breathing exercises, taking time for self-reflection, taking medications as prescribed, drinking water and daily exercise to improve mobility and mood. . Over the next 120 days, patient will demonstrate improved health management independence as evidenced by implementing healthy self-care skills and positive support/resources into her daily routine to help cope with stressors and improve overall health and well-being  . Over the next 120 days, patient or caregiver will verbalize basic understanding of depression/stress process and self health management plan as evidenced by her participation in development of long term plan of care and institution of self health management strategies  Interventions: . Inter-disciplinary care team collaboration (see longitudinal plan of care) . Patient interviewed and appropriate assessments performed. LCSW spoke with patient's sister [name] on 09/28/20 and 11/23/20. CCM LCSW also spoke with patient's son on 11/23/20. . Patient went to the ED on 09/27/20 after having back pain and nausea for the past 3 days. Patient had been sitting in her recliner and had not been getting up much for the last 3 days even after family encouraged her to go to the hospital. Patient would not allow her sisters to stay with her either even though  she was unable to care for herself. Patient has stage 3 kidney disease and arrived at ED with foul smelling urine. Patient only has one kidney. Patient has lost 13 pounds in the last 60 days. Per sister, patient has a kidney infection, kidney stones and urine infection. She shares that her son is going to the hospital today to visit patient. He is her POA. Family are concerned that patient has limited support within the home and lives alone. Family report that "she often fights [place]. She didn't even want her son to come to town." Patient is often non-compliant and is fearful of LTC placement.  UPDATE- Patient has been at [facility] since ED visit on 09/27/20. Patient currently has COVID and her support network (two sisters)  are unable to visit her in the facility which has increased patient's isolation. Before patient had COVID, sisters went to facility every morning to provide coffee, breakfast and socialization. Family reports that there is NO set discharge date but they are hopeful to eventually have patient relocate back home with them. However, they were advised that patient would need 24/7 care if she were to discharge back home from [facility] and family is unsure if they can met this need. Per patient's son, they are able to pay out of pocket for in home care and would like this resource information. CCM LCSW will sent secure email to patient's son with personal care service resources.  . Patient smokes heavily per sister.  . Family report that patient sometimes is forgetful and suspect dementia. Patient's cognition level changes per day per family.  . Per sister, son has offered to pay out of pocket for an aide but she declined wanting someone in her home.  [name] LCSW educated family on petitioning the court system for guardianship and provided contact number for [employer] ([place]) . Provided patient with information about level of care resources including: Metallurgist, PACE, ALF, SNF for rehab or long term, Adult Day Centers, [place], Private pay caregiving and [facility]. Family report that patient does not want to increase her level of care in fear of losing her independence.  . Discussed plans with patient for ongoing care management follow up and provided patient with direct contact information for care management team . Advised patient's caregiver to contact CCM team for any case management needs  . Assisted patient/caregiver with obtaining information about health plan benefits . Provided education to patient/caregiver regarding level of care options.  Patient Self Care Activities:  . Attends all scheduled provider appointments . Lacks social connections . Has a difficult time asking for help sometimes     Eula Fried, BSW, MSW, [email or web address] Phone: [phone]

## 2020-12-28 ENCOUNTER — Encounter: Payer: Self-pay | Admitting: Family Medicine

## 2020-12-28 ENCOUNTER — Telehealth (INDEPENDENT_AMBULATORY_CARE_PROVIDER_SITE_OTHER): Payer: Medicare Other | Admitting: Family Medicine

## 2020-12-28 VITALS — BP 110/95 | HR 91 | Temp 96.6°F

## 2020-12-28 DIAGNOSIS — R300 Dysuria: Secondary | ICD-10-CM

## 2020-12-28 DIAGNOSIS — Z515 Encounter for palliative care: Secondary | ICD-10-CM

## 2020-12-28 DIAGNOSIS — F0390 Unspecified dementia without behavioral disturbance: Secondary | ICD-10-CM | POA: Diagnosis not present

## 2020-12-28 DIAGNOSIS — I700 Atherosclerosis of aorta: Secondary | ICD-10-CM | POA: Diagnosis not present

## 2020-12-28 DIAGNOSIS — S31000D Unspecified open wound of lower back and pelvis without penetration into retroperitoneum, subsequent encounter: Secondary | ICD-10-CM | POA: Diagnosis not present

## 2020-12-28 DIAGNOSIS — R69 Illness, unspecified: Secondary | ICD-10-CM | POA: Diagnosis not present

## 2020-12-28 DIAGNOSIS — E43 Unspecified severe protein-calorie malnutrition: Secondary | ICD-10-CM

## 2020-12-28 MED ORDER — PANTOPRAZOLE SODIUM 40 MG PO TBEC: 40.0000 mg | DELAYED_RELEASE_TABLET | Freq: Every day | ORAL | 1 refills | Status: AC

## 2020-12-28 MED ORDER — QUETIAPINE FUMARATE 25 MG PO TABS: 25.0000 mg | ORAL_TABLET | Freq: Every day | ORAL | 1 refills | Status: AC

## 2020-12-28 MED ORDER — LORAZEPAM 0.5 MG PO TABS: 0.5000 mg | ORAL_TABLET | Freq: Four times a day (QID) | ORAL | 3 refills | Status: AC | PRN

## 2020-12-28 MED ORDER — APIXABAN 2.5 MG PO TABS: 2.5000 mg | ORAL_TABLET | Freq: Two times a day (BID) | ORAL | 3 refills | Status: AC

## 2020-12-28 MED ORDER — TRAMADOL HCL 50 MG PO TABS: 50.0000 mg | ORAL_TABLET | Freq: Three times a day (TID) | ORAL | 0 refills | Status: AC | PRN

## 2020-12-28 NOTE — Assessment & Plan Note (Signed)
Being followed by hospice. Will remain attending. Refills on lorazepam and tramadol given today to help keep her comfortable. DNR form filled out today. Call with any concerns. Continue to monitor.

## 2020-12-28 NOTE — Progress Notes (Signed)
BP (!) 110/95   Pulse 91   Temp (!) 96.6 F (35.9 C)   SpO2 98%    Subjective:    Patient ID: Cathy Solis, female    DOB: 09/14/1941, 80 y.o.   MRN: [id number]  HPI: Cathy Solis is a 80 y.o. female  Chief Complaint  Patient presents with  . Hospitalization Follow-up    Patient was admitted into hospital from 1/20 to 1/30. Patient was taking to ED for dehydration and suspected UTI.    Transition of Care Hospital Follow up.   Hospital/Facility: [facility] D/C Physician: Dr. Arbutus Ped D/C Date: 12/20/20  Records Requested: 12/28/20 Records Received:  12/28/20 Records Reviewed:  12/28/20  Diagnoses on Discharge:   Acute renal failure superimposed on stage 3a chronic kidney disease ([place]) Active Problems:   Hypothyroidism   Hypertension   COPD (chronic obstructive pulmonary disease) ([place])   Pressure injury of skin   Atrial fibrillation ([facility])   COVID-19   Acute cystitis   Normocytic anemia  Date of interactive Contact within 48 hours of discharge: 12/21/20 Contact was through: phone  Date of 7 day or 14 day face-to-face visit: 12/28/20   within 14 days  Outpatient Encounter Medications as of 12/28/2020  Medication Sig  . acetaminophen (TYLENOL) 325 MG tablet Take 2 tablets (650 mg total) by mouth every 6 (six) hours as needed for mild pain (or Fever >/= 101).  . feeding supplement (ENSURE ENLIVE / ENSURE PLUS) LIQD Take 237 mLs by mouth 2 (two) times daily between meals.  [name] ipratropium-albuterol (DUONEB) 0.5-2.5 (3) MG/3ML SOLN SMARTSIG:1 Vial(s) Via Nebulizer 6 Times Daily PRN  . levothyroxine (SYNTHROID) 150 MCG tablet Take 1 tablet (150 mcg total) by mouth daily.  . metoprolol tartrate (LOPRESSOR) 25 MG tablet Take 1 tablet (25 mg total) by mouth 2 (two) times daily.  . midodrine (PROAMATINE) 2.5 MG tablet Take 1 tablet (2.5 mg total) by mouth 2 (two) times daily with a meal.  . traMADol (ULTRAM) 50 MG tablet Take 1 tablet (50 mg total) by mouth every 8 (eight) hours as needed.   . [DISCONTINUED] apixaban (ELIQUIS) 2.5 MG TABS tablet Take 1 tablet (2.5 mg total) by mouth 2 (two) times daily.  . [DISCONTINUED] LORazepam (ATIVAN) 0.5 MG tablet Take 0.5 mg by mouth every 6 (six) hours as needed for anxiety.  . [DISCONTINUED] pantoprazole (PROTONIX) 40 MG tablet Take 40 mg by mouth daily.  . [DISCONTINUED] QUEtiapine (SEROQUEL) 25 MG tablet Take 25 mg by mouth at bedtime.  [name] albuterol (VENTOLIN HFA) 108 (90 Base) MCG/ACT inhaler Inhale 2 puffs into the lungs every 6 (six) hours as needed for wheezing or shortness of breath. (Patient not taking: Reported on 12/28/2020)  . apixaban (ELIQUIS) 2.5 MG TABS tablet Take 1 tablet (2.5 mg total) by mouth 2 (two) times daily.  . fluticasone furoate-vilanterol (BREO ELLIPTA) 200-25 MCG/INH AEPB Inhale 1 puff into the lungs daily. (Patient not taking: Reported on 12/28/2020)  . LORazepam (ATIVAN) 0.5 MG tablet Take 1 tablet (0.5 mg total) by mouth every 6 (six) hours as needed for anxiety.  . magnesium oxide (MAG-OX) 400 (241.3 Mg) MG tablet Take 1 tablet (400 mg total) by mouth 2 (two) times daily. (Patient not taking: Reported on 12/28/2020)  . Multiple Vitamin (MULTIVITAMIN WITH MINERALS) TABS tablet Take 1 tablet by mouth daily. (Patient not taking: Reported on 12/28/2020)  . ondansetron (ZOFRAN) 4 MG tablet Take 1 tablet (4 mg total) by mouth every 8 (eight) hours as needed  for nausea or vomiting. (Patient not taking: Reported on 12/28/2020)  . pantoprazole (PROTONIX) 40 MG tablet Take 1 tablet (40 mg total) by mouth daily.  . QUEtiapine (SEROQUEL) 25 MG tablet Take 1 tablet (25 mg total) by mouth at bedtime.   No facility-administered encounter medications on file as of 12/28/2020.  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    Hospital admission 10/06/2020 until 10/21/2020 for abdominal pain which ultimately resolved on its own.  She had been seen by GI and general surgery during that hospitalization as well.  She was discharged to [facility].  On admission, COVID PCR positive.  Family reported pt had tested positive for COVID on 11/16/2020 at [facility].  Evaluation in the ED - UA concerning for possible infection and was started on Rocephin; urine culture was sent.  CT head was unremarkable for acute findings.  CT renal stone showed stable but severe left-sided hydronephrosis with left sided Endo ureteral stent in place.  Urinary bladder wall thickening possibly due to cystitis.  And right basilar atelectasis/infiltrate.  CXR also was relatively clear except for the mild linear atelectatic changes appreciated in the right base that were seen on CT as well.  Admitted for antibiotics, fluid resuscitation, and treatment for COVID-19 infection.  She was started on Decadron and remdesivir.    Palliative care was consulted and hospice was discussed.  Family wanted to have patient as optimized as possible and get her home to see if she would again begin to thrive, as she was not even home 24 hours from rehab before this admission.  They feel she may improve once back home for longer this time.  Hospice is appropriate and family understand, agreeable to palliative following after discharge for support and eventual transition to hospice at some point should patient continue to decline.       Hypernatremia-due to poor p.o. intake, not enough water intake.  Treated with D5W until patient lose her IV and refused to have one replaced.  Family are supportive of encouraging patient to drink water. --Encourage patient to drink waterfrequently  Hypophosphatemia-1/28, 1/29 beingreplaced.  Monitor with poor nutrition status.  AKI  superimposed on CKD stage 3a-POA, Resolved.  Due to obstructive uropathy as outlined below in addition to prerenal azotemia.  Baseline creatinine approximately 1.2-1.3, presented with creatinine 1.96. Renal function improved with IV hydration.  Chronic Lt UPJ obstruction- Ureteral stent initially placed on 10/08/2020 by Dr. Richardo Hanks in the setting of solitary kidney, chronic left UPJ obstruction and UTI. Subsequently seen as an outpatient on 10/2021/21, and was deemed a poor surgical candidate for definitive reconstruction. Renal pelvic diameter pre and post stent have improved from 9 cm to 6 cm, suggestive that stent is functional. --Urology consulted, input appreciated - recommended Foley to maximize bladder emptying in addition to hydration 1/25 urology recommended DC Foley as creatinine was trending down.  Foley should be replaced if creatinine again trends upward. Per urology, treated with empiric Rocephin for total 10 to 14 days. Pt completed antibiotics during admission.  Acute cystitis- Hx of recent UTI in November 2021, and left UPJ obstruction requiring ureteral stent placement on 10/08/2020. Urine culture obtained on admission grew multiple species. Continued on empiric antibiotics per urology,  course completed during admittion. Due to chronic stent, patient likely has colonization, monitor for symptoms closely   Chronic respiratory failure with hypoxia due to COPD-uses 2 L/min nasal cannula oxygen at baseline. COPD not currently exacerbated, no wheezing on exam.  Chest x-ray relatively clear. Treated with steroids for Covid as below Continued on home regimen for COPD Respiratory status remained stable.  Atrial fibrillation- Rate controlled. Beta-blocker was previously stopped due to hypotension, and patient was started on midodrine. BP improved this admission, metoprolol was resumed with hold parameters.   --Continue Eliquis and metoprolol --Monitor heart rate and  blood pressure closely  COVID-19 infection-tested positive on 11/16/2020. Patient is vaccinated and received a booster. She tested positive at SNF but was not treated with anything. Still positive on admission here. Due to lethargy, borderline low oxygen saturation, underlying COPD and hypoxia in addition to other comorbidities, patient felt very high risk for progression to severe illness. She was started on treatment for Covid with dexamethasone and remdesivir --Completed both remdesivir dexamethasone Pt remained stable and minimally symptomatic, improved.  Hyperkalemia- Resolved with dose of Kayexalate. Remains stable BMP in follow up.  Hypothyroidism- takes Synthroid 150 mcg at home. TSH 11.746 on admission which may be also reactive given acute illness. Free T4 is actually very mildly elevated, 1.18 Needs repeat TSH in 4 to 6 weeks Continue current dose Synthroid  Normocytic anemia- Baseline hemoglobin ~9 to 10 g/dL. Stable and at baseline.   Hypertension- lisinopril on hold due to AKI. Beta blocker resumed. Blood pressures have been controlled for the most part and soft at times.  Severe protein calorie malnutrition/ Underweight-  Patient [id number] mass index is 14.44 kg/m. --Palliative care consulted -to follow at home after discharge --Dietitian was consulted.  --Continue nutritional supplements and vitamins at home."  Diagnostic Tests Reviewed:  CLINICAL DATA:  Right basilar infiltrate on recent CT  EXAM: CHEST  1 VIEW  COMPARISON:  CT from earlier in the same day.  FINDINGS: Cardiac shadow is within normal limits. Aortic calcifications are seen. Bilateral nipple shadows are noted. Mild linear atelectatic changes are noted in the right base similar to that seen on the prior CT examination. Lead is noted over the upper right chest wall.  IMPRESSION: Linear atelectatic changes in the right base similar to that seen on prior CT examination.  No other focal abnormality is noted.  CLINICAL DATA:  Burning with urination.  EXAM: CT ABDOMEN AND PELVIS WITHOUT CONTRAST  TECHNIQUE: Multidetector CT imaging of the abdomen and pelvis was performed following the standard protocol without IV contrast.  COMPARISON:  October 13, 2020  FINDINGS: Lower chest: Mild atelectasis and/or infiltrate is seen within the posterior aspect of the right lung base.  Hepatobiliary: No focal liver abnormality is seen. No gallstones, gallbladder wall thickening, or biliary dilatation. The gallstone seen within the gallbladder lumen on the prior study is not visualized on the current exam.  Pancreas: Unremarkable. No pancreatic ductal dilatation or surrounding inflammatory changes.  Spleen: Normal in size without focal abnormality.  Adrenals/Urinary Tract: Adrenal glands are unremarkable. The right kidney is absent. The left kidney is normal in size. A left-sided endo ureteral stent is seen and is unchanged in position. Stable marked severity left-sided hydronephrosis is also noted. Asymmetric moderate to marked severity lateral urinary bladder wall thickening is seen, right greater than left.  Stomach/Bowel: Stomach is within normal limits. The appendix is not identified. No evidence of bowel wall thickening, distention, or inflammatory changes.  Vascular/Lymphatic: Aortic atherosclerosis. No  enlarged abdominal or pelvic lymph nodes.  Reproductive: Status post hysterectomy. No adnexal masses.  Other: No abdominal wall hernia or abnormality. The small right inguinal hernia is seen on the prior study is no longer present. No abdominopelvic ascites.  Musculoskeletal: Multilevel degenerative changes seen throughout the lumbar spine. Marked severity dextroscoliosis is also seen.  IMPRESSION: 1. Mild right basilar atelectasis and/or infiltrate. 2. Stable marked severity left-sided hydronephrosis with a left-sided endo  ureteral stent in place. 3. Asymmetric moderate to marked severity urinary bladder wall thickening which may represent sequelae associated with cystitis. Correlation with urinalysis is recommended to exclude the presence of an underlying neoplastic process. 4. Aortic atherosclerosis.  Aortic Atherosclerosis (ICD10-I70.0).  CLINICAL DATA:  Mental status change  EXAM: CT HEAD WITHOUT CONTRAST  TECHNIQUE: Contiguous axial images were obtained from the base of the skull through the vertex without intravenous contrast.  COMPARISON:  None.  FINDINGS: Brain: No evidence of acute territorial infarction, hemorrhage, hydrocephalus,extra-axial collection or mass lesion/mass effect. There is dilatation the ventricles and sulci consistent with age-related atrophy. Low-attenuation changes in the deep white matter consistent with small vessel ischemia.  Vascular: No hyperdense vessel or unexpected calcification.  Skull: The skull is intact. No fracture or focal lesion identified.  Sinuses/Orbits: The visualized paranasal sinuses and mastoid air cells are clear. The orbits and globes intact.  Other: None  IMPRESSION: No acute intracranial abnormality.  Findings consistent with age related atrophy and chronic small vessel ischemia  Disposition: Home with hospice  Consults: Palliative  Discharge Instructions: 1. Follow up with PCP in 1-2 weeks 2. Please obtain BMP/CBC in one week 3. Please follow up on patient's sacral wound/s 4. Follow up with palliative care and continue discussions regarding patient's goals of care and wishes   Disease/illness Education: Discussed today  Home Health/Community Services Discussions/Referrals: in place  Establishment or re-establishment of referral orders for community resources:  in place  Discussion with other health care providers: None  Assessment and Support of treatment regimen adherence: Good  Appointments Coordinated  with: Son and sister  Education for self-management, independent living, and ADLs: Discussed today  Since getting out of the hospital, she is sleeping a lot, moaning a bit in pain. They were concerned that she had a UTI again. She has never stopped being confused. She was running a little bit of a fever about a week ago. She has not had a temperature in the past couple of days. She has been in some pain. They were still concerned about a UTI- but they are not super concerned about it, it seems like it has resolved since then. They have hospice is coming out. Having 3 aides in coming out to help as well. She has round the clock care and they have been keeping her clean and monitoring her wound. She is not very aware or awake. Most of the time, she doesn't know who anyone is. She does not know where she is or when it is. They note that she is really not eating very much at all. She is probably getting 300-500 calories a day. She is not drinking very much either. Her stools are pretty much water and she has had very dark urine. Her family is around her and would like a DNR form. They are very interested in keeping her comfortable.   Relevant past medical, surgical, family and social history reviewed and updated as indicated. Interim medical history since our last visit reviewed. Allergies and medications reviewed and updated.  Review of  Systems  Constitutional: Positive for fatigue and fever. Negative for activity change, appetite change, chills, diaphoresis and unexpected weight change.  HENT: Negative.   Respiratory: Negative.   Cardiovascular: Negative.   Gastrointestinal: Negative.   Musculoskeletal: Negative.   Skin: Positive for pallor. Negative for color change, rash and wound.  Neurological: Negative.   Psychiatric/Behavioral: Positive for confusion. Negative for agitation, behavioral problems, decreased concentration, dysphoric mood, hallucinations, self-injury, sleep disturbance and suicidal  ideas. The patient is not nervous/anxious and is not hyperactive.     Per HPI unless specifically indicated above     Objective:    BP (!) 110/95   Pulse 91   Temp (!) 96.6 F (35.9 C)   SpO2 98%   Wt Readings from Last 3 Encounters:  12/10/20 95 lb (43.1 kg)  11/11/20 100 lb (45.4 kg)  10/18/20 111 lb 8 oz (50.6 kg)    Physical Exam Vitals and nursing note reviewed.  Constitutional:      General: She is not in acute distress.    Appearance: Normal appearance. She is ill-appearing. She is not toxic-appearing or diaphoretic.  HENT:     Head: Normocephalic and atraumatic.     Right Ear: External ear normal.     Left Ear: External ear normal.     Nose: Nose normal.     Mouth/Throat:     Mouth: Mucous membranes are moist.     Pharynx: Oropharynx is clear.  Eyes:     General: No scleral icterus.       Right eye: No discharge.        Left eye: No discharge.     Conjunctiva/sclera: Conjunctivae normal.     Pupils: Pupils are equal, round, and reactive to light.  Pulmonary:     Effort: Pulmonary effort is normal. No respiratory distress.     Comments: Speaking in full sentences Musculoskeletal:        General: Normal range of motion.     Cervical back: Normal range of motion.  Skin:    Coloration: Skin is not jaundiced or pale.     Findings: No bruising, erythema, lesion or rash.  Neurological:     Mental Status: She is disoriented and confused.  Psychiatric:        Speech: She is noncommunicative.     Results for orders placed or performed during the hospital encounter of 12/10/20  SARS CORONAVIRUS 2 (TAT 6-24 HRS) Nasopharyngeal Nasopharyngeal Swab   Specimen: Nasopharyngeal Swab  Result Value Ref Range   SARS Coronavirus 2 POSITIVE (A) NEGATIVE  Urine Culture   Specimen: Urine, Random  Result Value Ref Range   Specimen Description      URINE, RANDOM Performed at [facility], [address], [place], [place] [address]    Special Requests       NONE Performed at [facility], ., [place], [place] [address]    Culture MULTIPLE SPECIES PRESENT, SUGGEST RECOLLECTION (A)    Report Status 12/12/2020 FINAL   Basic metabolic panel  Result Value Ref Range   Sodium 138 135 - 145 mmol/L   Potassium 4.4 3.5 - 5.1 mmol/L   Chloride 107 98 - 111 mmol/L   CO2 20 (L) 22 - 32 mmol/L   Glucose, Bld 103 (H) 70 - 99 mg/dL   BUN 85 (H) 8 - 23 mg/dL   Creatinine, Ser 1.96 (H) 0.44 - 1.00 mg/dL   Calcium 8.9 8.9 - 10.3 mg/dL   GFR, Estimated 25 (  L) >60 mL/min   Anion gap 11 5 - 15  CBC  Result Value Ref Range   WBC 6.6 4.0 - 10.5 K/uL   RBC 3.76 (L) 3.87 - 5.11 MIL/uL   Hemoglobin 11.1 (L) 12.0 - 15.0 g/dL   HCT 35.2 (L) 36.0 - 46.0 %   MCV 93.6 80.0 - 100.0 fL   MCH 29.5 26.0 - 34.0 pg   MCHC 31.5 30.0 - 36.0 g/dL   RDW 16.8 (H) 11.5 - 15.5 %   Platelets 397 150 - 400 K/uL   nRBC 0.0 0.0 - 0.2 %  Urinalysis, Complete w Microscopic  Result Value Ref Range   Color, Urine YELLOW (A) YELLOW   APPearance TURBID (A) CLEAR   Specific Gravity, Urine 1.016 1.005 - 1.030   pH 7.0 5.0 - 8.0   Glucose, UA NEGATIVE NEGATIVE mg/dL   Hgb urine dipstick MODERATE (A) NEGATIVE   Bilirubin Urine NEGATIVE NEGATIVE   Ketones, ur 5 (A) NEGATIVE mg/dL   Protein, ur >=300 (A) NEGATIVE mg/dL   Nitrite NEGATIVE NEGATIVE   Leukocytes,Ua TRACE (A) NEGATIVE   RBC / HPF >50 (H) 0 - 5 RBC/hpf   WBC, UA >50 (H) 0 - 5 WBC/hpf   Bacteria, UA NONE SEEN NONE SEEN   Squamous Epithelial / LPF NONE SEEN 0 - 5   WBC Clumps PRESENT    Budding Yeast PRESENT    Amorphous Crystal PRESENT   Hepatic function panel  Result Value Ref Range   Total Protein 6.3 (L) 6.5 - 8.1 g/dL   Albumin 2.5 (L) 3.5 - 5.0 g/dL   AST 14 (L) 15 - 41 U/L   ALT 8 0 - 44 U/L   Alkaline Phosphatase 60 38 - 126 U/L   Total Bilirubin 0.8 0.3 - 1.2 mg/dL   Bilirubin, Direct 0.1 0.0 - 0.2 mg/dL   Indirect Bilirubin 0.7 0.3 - 0.9 mg/dL  Lipase, blood  Result Value Ref  Range   Lipase 26 11 - 51 U/L  Magnesium  Result Value Ref Range   Magnesium 2.2 1.7 - 2.4 mg/dL  Phosphorus  Result Value Ref Range   Phosphorus 3.8 2.5 - 4.6 mg/dL  Magnesium  Result Value Ref Range   Magnesium 2.1 1.7 - 2.4 mg/dL  Phosphorus  Result Value Ref Range   Phosphorus 4.4 2.5 - 4.6 mg/dL  CBC  Result Value Ref Range   WBC 6.4 4.0 - 10.5 K/uL   RBC 3.64 (L) 3.87 - 5.11 MIL/uL   Hemoglobin 10.7 (L) 12.0 - 15.0 g/dL   HCT 33.9 (L) 36.0 - 46.0 %   MCV 93.1 80.0 - 100.0 fL   MCH 29.4 26.0 - 34.0 pg   MCHC 31.6 30.0 - 36.0 g/dL   RDW 16.9 (H) 11.5 - 15.5 %   Platelets 383 150 - 400 K/uL   nRBC 0.0 0.0 - 0.2 %  Basic metabolic panel  Result Value Ref Range   Sodium 138 135 - 145 mmol/L   Potassium 4.8 3.5 - 5.1 mmol/L   Chloride 108 98 - 111 mmol/L   CO2 19 (L) 22 - 32 mmol/L   Glucose, Bld 95 70 - 99 mg/dL   BUN 87 (H) 8 - 23 mg/dL   Creatinine, Ser 2.24 (H) 0.44 - 1.00 mg/dL   Calcium 8.9 8.9 - 10.3 mg/dL   GFR, Estimated 22 (L) >60 mL/min   Anion gap 11 5 - 15  TSH  Result Value Ref Range   TSH  11.746 (H) 0.350 - 4.500 uIU/mL  Vitamin B12  Result Value Ref Range   Vitamin B-12 612 180 - 914 pg/mL  T3  Result Value Ref Range   T3, Total 36 (L) 71 - 180 ng/dL  T4, free  Result Value Ref Range   Free T4 1.18 (H) 0.61 - 1.12 ng/dL  Lactate dehydrogenase  Result Value Ref Range   LDH 123 98 - 192 U/L  C-reactive protein  Result Value Ref Range   CRP 7.8 (H) <1.0 mg/dL  Procalcitonin - Baseline  Result Value Ref Range   Procalcitonin 0.53 ng/mL  Fibrin derivatives D-Dimer ([facility] only)  Result Value Ref Range   Fibrin derivatives D-dimer ([facility]) 648.30 (H) 0.00 - 499.00 ng/mL (FEU)  Procalcitonin  Result Value Ref Range   Procalcitonin 1.03 ng/mL  CBC with Differential/Platelet  Result Value Ref Range   WBC 3.8 (L) 4.0 - 10.5 K/uL   RBC 3.88 3.87 - 5.11 MIL/uL   Hemoglobin 11.5 (L) 12.0 - 15.0 g/dL   HCT 35.7 (L) 36.0 - 46.0 %   MCV 92.0 80.0 -  100.0 fL   MCH 29.6 26.0 - 34.0 pg   MCHC 32.2 30.0 - 36.0 g/dL   RDW 17.3 (H) 11.5 - 15.5 %   Platelets 381 150 - 400 K/uL   nRBC 0.0 0.0 - 0.2 %   Neutrophils Relative % 67 %   Neutro Abs 2.6 1.7 - 7.7 K/uL   Lymphocytes Relative 18 %   Lymphs Abs 0.7 0.7 - 4.0 K/uL   Monocytes Relative 13 %   Monocytes Absolute 0.5 0.1 - 1.0 K/uL   Eosinophils Relative 0 %   Eosinophils Absolute 0.0 0.0 - 0.5 K/uL   Basophils Relative 1 %   Basophils Absolute 0.0 0.0 - 0.1 K/uL   Immature Granulocytes 1 %   Abs Immature Granulocytes 0.02 0.00 - 0.07 K/uL  Comprehensive metabolic panel  Result Value Ref Range   Sodium 143 135 - 145 mmol/L   Potassium 5.4 (H) 3.5 - 5.1 mmol/L   Chloride 112 (H) 98 - 111 mmol/L   CO2 18 (L) 22 - 32 mmol/L   Glucose, Bld 85 70 - 99 mg/dL   BUN 85 (H) 8 - 23 mg/dL   Creatinine, Ser 2.56 (H) 0.44 - 1.00 mg/dL   Calcium 8.8 (L) 8.9 - 10.3 mg/dL   Total Protein 6.3 (L) 6.5 - 8.1 g/dL   Albumin 2.3 (L) 3.5 - 5.0 g/dL   AST 11 (L) 15 - 41 U/L   ALT 9 0 - 44 U/L   Alkaline Phosphatase 62 38 - 126 U/L   Total Bilirubin 0.5 0.3 - 1.2 mg/dL   GFR, Estimated 18 (L) >60 mL/min   Anion gap 13 5 - 15  Magnesium  Result Value Ref Range   Magnesium 2.1 1.7 - 2.4 mg/dL  Phosphorus  Result Value Ref Range   Phosphorus 5.2 (H) 2.5 - 4.6 mg/dL  Fibrin derivatives D-Dimer ([facility] only)  Result Value Ref Range   Fibrin derivatives D-dimer ([facility]) 597.15 (H) 0.00 - 499.00 ng/mL (FEU)  C-reactive protein  Result Value Ref Range   CRP 11.2 (H) <1.0 mg/dL  Lactate dehydrogenase  Result Value Ref Range   LDH 109 98 - 192 U/L  Procalcitonin  Result Value Ref Range   Procalcitonin 0.63 ng/mL  CBC with Differential/Platelet  Result Value Ref Range   WBC 6.6 4.0 - 10.5 K/uL   RBC 3.49 (L) 3.87 - 5.11 MIL/uL  Hemoglobin 10.4 (L) 12.0 - 15.0 g/dL   HCT 32.7 (L) 36.0 - 46.0 %   MCV 93.7 80.0 - 100.0 fL   MCH 29.8 26.0 - 34.0 pg   MCHC 31.8 30.0 - 36.0 g/dL   RDW 17.7 (H)  11.5 - 15.5 %   Platelets 370 150 - 400 K/uL   nRBC 0.0 0.0 - 0.2 %   Neutrophils Relative % 74 %   Neutro Abs 4.9 1.7 - 7.7 K/uL   Lymphocytes Relative 13 %   Lymphs Abs 0.8 0.7 - 4.0 K/uL   Monocytes Relative 12 %   Monocytes Absolute 0.8 0.1 - 1.0 K/uL   Eosinophils Relative 0 %   Eosinophils Absolute 0.0 0.0 - 0.5 K/uL   Basophils Relative 0 %   Basophils Absolute 0.0 0.0 - 0.1 K/uL   Immature Granulocytes 1 %   Abs Immature Granulocytes 0.05 0.00 - 0.07 K/uL  Comprehensive metabolic panel  Result Value Ref Range   Sodium 143 135 - 145 mmol/L   Potassium 5.0 3.5 - 5.1 mmol/L   Chloride 114 (H) 98 - 111 mmol/L   CO2 19 (L) 22 - 32 mmol/L   Glucose, Bld 94 70 - 99 mg/dL   BUN 89 (H) 8 - 23 mg/dL   Creatinine, Ser 2.44 (H) 0.44 - 1.00 mg/dL   Calcium 8.6 (L) 8.9 - 10.3 mg/dL   Total Protein 6.0 (L) 6.5 - 8.1 g/dL   Albumin 2.1 (L) 3.5 - 5.0 g/dL   AST 15 15 - 41 U/L   ALT 8 0 - 44 U/L   Alkaline Phosphatase 56 38 - 126 U/L   Total Bilirubin 0.5 0.3 - 1.2 mg/dL   GFR, Estimated 19 (L) >60 mL/min   Anion gap 10 5 - 15  Magnesium  Result Value Ref Range   Magnesium 2.2 1.7 - 2.4 mg/dL  Phosphorus  Result Value Ref Range   Phosphorus 4.0 2.5 - 4.6 mg/dL  Fibrin derivatives D-Dimer ([facility] only)  Result Value Ref Range   Fibrin derivatives D-dimer ([facility]) 730.35 (H) 0.00 - 499.00 ng/mL (FEU)  C-reactive protein  Result Value Ref Range   CRP 7.4 (H) <1.0 mg/dL  Lactate dehydrogenase  Result Value Ref Range   LDH 132 98 - 192 U/L  CBC with Differential/Platelet  Result Value Ref Range   WBC 6.6 4.0 - 10.5 K/uL   RBC 3.02 (L) 3.87 - 5.11 MIL/uL   Hemoglobin 9.1 (L) 12.0 - 15.0 g/dL   HCT 28.1 (L) 36.0 - 46.0 %   MCV 93.0 80.0 - 100.0 fL   MCH 30.1 26.0 - 34.0 pg   MCHC 32.4 30.0 - 36.0 g/dL   RDW 18.0 (H) 11.5 - 15.5 %   Platelets 312 150 - 400 K/uL   nRBC 0.0 0.0 - 0.2 %   Neutrophils Relative % 82 %   Neutro Abs 5.3 1.7 - 7.7 K/uL   Lymphocytes Relative 11 %    Lymphs Abs 0.7 0.7 - 4.0 K/uL   Monocytes Relative 6 %   Monocytes Absolute 0.4 0.1 - 1.0 K/uL   Eosinophils Relative 0 %   Eosinophils Absolute 0.0 0.0 - 0.5 K/uL   Basophils Relative 0 %   Basophils Absolute 0.0 0.0 - 0.1 K/uL   Immature Granulocytes 1 %   Abs Immature Granulocytes 0.08 (H) 0.00 - 0.07 K/uL  Comprehensive metabolic panel  Result Value Ref Range   Sodium 145 135 - 145 mmol/L   Potassium 4.3  3.5 - 5.1 mmol/L   Chloride 116 (H) 98 - 111 mmol/L   CO2 18 (L) 22 - 32 mmol/L   Glucose, Bld 107 (H) 70 - 99 mg/dL   BUN 91 (H) 8 - 23 mg/dL   Creatinine, Ser 2.16 (H) 0.44 - 1.00 mg/dL   Calcium 8.4 (L) 8.9 - 10.3 mg/dL   Total Protein 5.5 (L) 6.5 - 8.1 g/dL   Albumin 2.1 (L) 3.5 - 5.0 g/dL   AST 12 (L) 15 - 41 U/L   ALT 8 0 - 44 U/L   Alkaline Phosphatase 53 38 - 126 U/L   Total Bilirubin 0.4 0.3 - 1.2 mg/dL   GFR, Estimated 22 (L) >60 mL/min   Anion gap 11 5 - 15  Magnesium  Result Value Ref Range   Magnesium 2.1 1.7 - 2.4 mg/dL  Phosphorus  Result Value Ref Range   Phosphorus 3.5 2.5 - 4.6 mg/dL  Fibrin derivatives D-Dimer ([facility] only)  Result Value Ref Range   Fibrin derivatives D-dimer ([facility]) 737.48 (H) 0.00 - 499.00 ng/mL (FEU)  C-reactive protein  Result Value Ref Range   CRP 3.9 (H) <1.0 mg/dL  Lactate dehydrogenase  Result Value Ref Range   LDH 121 98 - 192 U/L  Fibrinogen  Result Value Ref Range   Fibrinogen 347 210 - 475 mg/dL  CBC with Differential/Platelet  Result Value Ref Range   WBC 8.6 4.0 - 10.5 K/uL   RBC 3.20 (L) 3.87 - 5.11 MIL/uL   Hemoglobin 9.7 (L) 12.0 - 15.0 g/dL   HCT 31.5 (L) 36.0 - 46.0 %   MCV 98.4 80.0 - 100.0 fL   MCH 30.3 26.0 - 34.0 pg   MCHC 30.8 30.0 - 36.0 g/dL   RDW 18.7 (H) 11.5 - 15.5 %   Platelets 311 150 - 400 K/uL   nRBC 0.2 0.0 - 0.2 %   Neutrophils Relative % 78 %   Neutro Abs 6.7 1.7 - 7.7 K/uL   Lymphocytes Relative 13 %   Lymphs Abs 1.2 0.7 - 4.0 K/uL   Monocytes Relative 6 %   Monocytes Absolute 0.5  0.1 - 1.0 K/uL   Eosinophils Relative 0 %   Eosinophils Absolute 0.0 0.0 - 0.5 K/uL   Basophils Relative 0 %   Basophils Absolute 0.0 0.0 - 0.1 K/uL   Immature Granulocytes 3 %   Abs Immature Granulocytes 0.23 (H) 0.00 - 0.07 K/uL  Comprehensive metabolic panel  Result Value Ref Range   Sodium 147 (H) 135 - 145 mmol/L   Potassium 4.5 3.5 - 5.1 mmol/L   Chloride 120 (H) 98 - 111 mmol/L   CO2 17 (L) 22 - 32 mmol/L   Glucose, Bld 95 70 - 99 mg/dL   BUN 81 (H) 8 - 23 mg/dL   Creatinine, Ser 1.73 (H) 0.44 - 1.00 mg/dL   Calcium 8.4 (L) 8.9 - 10.3 mg/dL   Total Protein 5.5 (L) 6.5 - 8.1 g/dL   Albumin 2.1 (L) 3.5 - 5.0 g/dL   AST 17 15 - 41 U/L   ALT 9 0 - 44 U/L   Alkaline Phosphatase 54 38 - 126 U/L   Total Bilirubin 0.3 0.3 - 1.2 mg/dL   GFR, Estimated 29 (L) >60 mL/min   Anion gap 10 5 - 15  Magnesium  Result Value Ref Range   Magnesium 2.2 1.7 - 2.4 mg/dL  Phosphorus  Result Value Ref Range   Phosphorus 2.3 (L) 2.5 - 4.6 mg/dL  Fibrin  derivatives D-Dimer ([facility] only)  Result Value Ref Range   Fibrin derivatives D-dimer ([facility]) 688.78 (H) 0.00 - 499.00 ng/mL (FEU)  C-reactive protein  Result Value Ref Range   CRP 1.9 (H) <1.0 mg/dL  Lactate dehydrogenase  Result Value Ref Range   LDH 133 98 - 192 U/L  Fibrinogen  Result Value Ref Range   Fibrinogen 333 210 - 475 mg/dL  CBC with Differential/Platelet  Result Value Ref Range   WBC 9.9 4.0 - 10.5 K/uL   RBC 3.23 (L) 3.87 - 5.11 MIL/uL   Hemoglobin 9.7 (L) 12.0 - 15.0 g/dL   HCT 31.5 (L) 36.0 - 46.0 %   MCV 97.5 80.0 - 100.0 fL   MCH 30.0 26.0 - 34.0 pg   MCHC 30.8 30.0 - 36.0 g/dL   RDW 19.0 (H) 11.5 - 15.5 %   Platelets 279 150 - 400 K/uL   nRBC 0.0 0.0 - 0.2 %   Neutrophils Relative % 80 %   Neutro Abs 8.0 (H) 1.7 - 7.7 K/uL   Lymphocytes Relative 12 %   Lymphs Abs 1.2 0.7 - 4.0 K/uL   Monocytes Relative 6 %   Monocytes Absolute 0.6 0.1 - 1.0 K/uL   Eosinophils Relative 0 %   Eosinophils Absolute 0.0 0.0 - 0.5  K/uL   Basophils Relative 0 %   Basophils Absolute 0.0 0.0 - 0.1 K/uL   Immature Granulocytes 2 %   Abs Immature Granulocytes 0.18 (H) 0.00 - 0.07 K/uL  Comprehensive metabolic panel  Result Value Ref Range   Sodium 148 (H) 135 - 145 mmol/L   Potassium 4.7 3.5 - 5.1 mmol/L   Chloride 120 (H) 98 - 111 mmol/L   CO2 19 (L) 22 - 32 mmol/L   Glucose, Bld 79 70 - 99 mg/dL   BUN 67 (H) 8 - 23 mg/dL   Creatinine, Ser 1.53 (H) 0.44 - 1.00 mg/dL   Calcium 8.6 (L) 8.9 - 10.3 mg/dL   Total Protein 5.3 (L) 6.5 - 8.1 g/dL   Albumin 2.2 (L) 3.5 - 5.0 g/dL   AST 16 15 - 41 U/L   ALT 10 0 - 44 U/L   Alkaline Phosphatase 56 38 - 126 U/L   Total Bilirubin 0.3 0.3 - 1.2 mg/dL   GFR, Estimated 34 (L) >60 mL/min   Anion gap 9 5 - 15  Magnesium  Result Value Ref Range   Magnesium 2.3 1.7 - 2.4 mg/dL  Phosphorus  Result Value Ref Range   Phosphorus 2.4 (L) 2.5 - 4.6 mg/dL  Fibrin derivatives D-Dimer ([facility] only)  Result Value Ref Range   Fibrin derivatives D-dimer ([facility]) 943.53 (H) 0.00 - 499.00 ng/mL (FEU)  C-reactive protein  Result Value Ref Range   CRP 1.3 (H) <1.0 mg/dL  Lactate dehydrogenase  Result Value Ref Range   LDH 156 98 - 192 U/L  Fibrinogen  Result Value Ref Range   Fibrinogen 347 210 - 475 mg/dL  Fibrin derivatives D-Dimer ([facility] only)  Result Value Ref Range   Fibrin derivatives D-dimer ([facility]) 983.49 (H) 0.00 - 499.00 ng/mL (FEU)  Fibrinogen  Result Value Ref Range   Fibrinogen 324 210 - 475 mg/dL  Basic metabolic panel  Result Value Ref Range   Sodium 147 (H) 135 - 145 mmol/L   Potassium 4.8 3.5 - 5.1 mmol/L   Chloride 121 (H) 98 - 111 mmol/L   CO2 20 (L) 22 - 32 mmol/L   Glucose, Bld 93 70 - 99 mg/dL  BUN 60 (H) 8 - 23 mg/dL   Creatinine, Ser 1.611.51 (H) 0.44 - 1.00 mg/dL   Calcium 8.4 (L) 8.9 - 10.3 mg/dL   GFR, Estimated 34 (L) >60 mL/min   Anion gap 6 5 - 15  CBC  Result Value Ref Range   WBC 10.6 (H) 4.0 - 10.5 K/uL   RBC 3.31 (L) 3.87 - 5.11 MIL/uL    Hemoglobin 10.0 (L) 12.0 - 15.0 g/dL   HCT 09.631.4 (L) 04.536.0 - 40.946.0 %   MCV 94.9 80.0 - 100.0 fL   MCH 30.2 26.0 - 34.0 pg   MCHC 31.8 30.0 - 36.0 g/dL   RDW 81.119.4 (H) 91.411.5 - 78.215.5 %   Platelets 311 150 - 400 K/uL   nRBC 0.0 0.0 - 0.2 %  Fibrin derivatives D-Dimer ([facility] only)  Result Value Ref Range   Fibrin derivatives D-dimer ([facility]) 789.98 (H) 0.00 - 499.00 ng/mL (FEU)  Basic metabolic panel  Result Value Ref Range   Sodium 150 (H) 135 - 145 mmol/L   Potassium 4.6 3.5 - 5.1 mmol/L   Chloride 123 (H) 98 - 111 mmol/L   CO2 21 (L) 22 - 32 mmol/L   Glucose, Bld 124 (H) 70 - 99 mg/dL   BUN 55 (H) 8 - 23 mg/dL   Creatinine, Ser 9.561.34 (H) 0.44 - 1.00 mg/dL   Calcium 8.4 (L) 8.9 - 10.3 mg/dL   GFR, Estimated 40 (L) >60 mL/min   Anion gap 6 5 - 15  CBC  Result Value Ref Range   WBC 11.3 (H) 4.0 - 10.5 K/uL   RBC 3.04 (L) 3.87 - 5.11 MIL/uL   Hemoglobin 9.3 (L) 12.0 - 15.0 g/dL   HCT 21.328.5 (L) 08.636.0 - 57.846.0 %   MCV 93.8 80.0 - 100.0 fL   MCH 30.6 26.0 - 34.0 pg   MCHC 32.6 30.0 - 36.0 g/dL   RDW 46.919.9 (H) 62.911.5 - 52.815.5 %   Platelets 300 150 - 400 K/uL   nRBC 0.0 0.0 - 0.2 %  C-reactive protein  Result Value Ref Range   CRP 0.6 <1.0 mg/dL  Phosphorus  Result Value Ref Range   Phosphorus 2.2 (L) 2.5 - 4.6 mg/dL  Fibrin derivatives D-Dimer ([facility] only)  Result Value Ref Range   Fibrin derivatives D-dimer ([facility]) 943.53 (H) 0.00 - 499.00 ng/mL (FEU)  Basic metabolic panel  Result Value Ref Range   Sodium 146 (H) 135 - 145 mmol/L   Potassium 4.4 3.5 - 5.1 mmol/L   Chloride 116 (H) 98 - 111 mmol/L   CO2 24 22 - 32 mmol/L   Glucose, Bld 74 70 - 99 mg/dL   BUN 48 (H) 8 - 23 mg/dL   Creatinine, Ser 4.131.17 (H) 0.44 - 1.00 mg/dL   Calcium 8.3 (L) 8.9 - 10.3 mg/dL   GFR, Estimated 47 (L) >60 mL/min   Anion gap 6 5 - 15  CBC  Result Value Ref Range   WBC 14.0 (H) 4.0 - 10.5 K/uL   RBC 2.83 (L) 3.87 - 5.11 MIL/uL   Hemoglobin 8.5 (L) 12.0 - 15.0 g/dL   HCT 24.427.8 (L) 01.036.0 - 27.246.0 %   MCV 98.2  80.0 - 100.0 fL   MCH 30.0 26.0 - 34.0 pg   MCHC 30.6 30.0 - 36.0 g/dL   RDW 53.619.9 (H) 64.411.5 - 03.415.5 %   Platelets 259 150 - 400 K/uL   nRBC 0.0 0.0 - 0.2 %  Phosphorus  Result Value Ref Range  Phosphorus 2.1 (L) 2.5 - 4.6 mg/dL  Magnesium  Result Value Ref Range   Magnesium 2.1 1.7 - 2.4 mg/dL  Fibrin derivatives D-Dimer ([facility] only)  Result Value Ref Range   Fibrin derivatives D-dimer ([facility]) 767.34 (H) 0.00 - 499.00 ng/mL (FEU)  Basic metabolic panel  Result Value Ref Range   Sodium 137 135 - 145 mmol/L   Potassium 4.5 3.5 - 5.1 mmol/L   Chloride 106 98 - 111 mmol/L   CO2 23 22 - 32 mmol/L   Glucose, Bld 100 (H) 70 - 99 mg/dL   BUN 42 (H) 8 - 23 mg/dL   Creatinine, Ser 1.19 (H) 0.44 - 1.00 mg/dL   Calcium 8.2 (L) 8.9 - 10.3 mg/dL   GFR, Estimated 46 (L) >60 mL/min   Anion gap 8 5 - 15  Phosphorus  Result Value Ref Range   Phosphorus 3.0 2.5 - 4.6 mg/dL  Troponin I (High Sensitivity)  Result Value Ref Range   Troponin I (High Sensitivity) 20 (H) <18 ng/L      Assessment & Plan:   Problem List Items Addressed This Visit      Other   Hospice care patient - Primary    Being followed by hospice. Will remain attending. Refills on lorazepam and tramadol given today to help keep her comfortable. DNR form filled out today. Call with any concerns. Continue to monitor.        Other Visit Diagnoses    Dementia without behavioral disturbance, unspecified dementia type ([place])       Worsening with her medical problems. On hospice. Continue comfort measures. Continue to monitor.    Relevant Medications   QUEtiapine (SEROQUEL) 25 MG tablet   LORazepam (ATIVAN) 0.5 MG tablet   Severe malnutrition ([facility])       Worsening with her medical problems. On hospice. Continue comfort measures. Continue to monitor.    Dysuria       Order for UA placed. They can drop off urine when needed.    Relevant Orders   Urinalysis, Routine w reflex microscopic   Aortic atherosclerosis ([place])       On  hospice. Will not treat at this time.    Relevant Medications   apixaban (ELIQUIS) 2.5 MG TABS tablet   Wound of sacral region, subsequent encounter       Being monitored by hosipce and aides. Continue dressing changes. Call with any concerns.        Follow up plan: Return if symptoms worsen or fail to improve.   . This visit was completed via MyChart due to the restrictions of the COVID-19 pandemic. All issues as above were discussed and addressed. Physical exam was done as above through visual confirmation on MyChart. If it was felt that the patient should be evaluated in the office, they were directed there. The patient verbally consented to this visit. . Location of the patient: home . Location of the provider: work . Those involved with this call:  . Provider: Park Liter, DO . CMA: Louanna Raw, [place] . Front Desk/Registration: Levert Feinstein  . Time spent on call: 25 minutes with patient face to face via video conference. More than 50% of this time was spent in counseling and coordination of care. 40 minutes total spent in review of patient's record and preparation of their chart.

## 2021-01-05 ENCOUNTER — Telehealth: Payer: Self-pay

## 2021-01-05 NOTE — Telephone Encounter (Signed)
Copied from [place] [phone]. Topic: General - Other >> Jan 05, 2021  9:07 AM Oneta Rack wrote: Osvaldo Human name: [name]  Relation to pt: from [facility]  Call back number: [phone]   Reason for call:  Patient pronounced dead on 01-09-21 at 2:44pm wanted to inform PCP.

## 2021-01-06 ENCOUNTER — Other Ambulatory Visit: Payer: Self-pay | Admitting: Family Medicine

## 2021-01-06 NOTE — Telephone Encounter (Signed)
Unable to done message 

## 2021-01-06 NOTE — Telephone Encounter (Signed)
Requested medication (s) are due for refill today:   No  Requested medication (s) are on the active medication list:   Yes  Future visit scheduled:   No  Seen 1 wk ago as Hospice pt.   Last ordered: 12/28/2020 #90, 1 refill  Clinic note:  Returned because pharmacy requesting a 90 day supply only.   Requested Prescriptions  Pending Prescriptions Disp Refills   QUEtiapine (SEROQUEL) 25 MG tablet [Pharmacy Med Name: QUETIAPINE FUMARATE 25 MG TAB] 90 tablet 2    Sig: TAKE 1 TABLET BY MOUTH EVERYDAY AT BEDTIME      Not Delegated - Psychiatry:  Antipsychotics - Second Generation (Atypical) - quetiapine Failed - 01/06/2021  2:31 PM      Failed - This refill cannot be delegated      Failed - Last BP in normal range    BP Readings from Last 1 Encounters:  12/28/20 (!) 110/95          Passed - ALT in normal range and within 180 days    ALT  Date Value Ref Range Status  12/16/2020 10 0 - 44 U/L Final          Passed - AST in normal range and within 180 days    AST  Date Value Ref Range Status  12/16/2020 16 15 - 41 U/L Final          Passed - Valid encounter within last 6 months    Recent Outpatient Visits           1 week ago Hospice care patient   [place], Megan P, DO   1 month ago COVID-19   [facility], [place], DO   4 months ago Hypothyroidism, unspecified type   [facility], Megan P, DO   5 months ago Simple chronic bronchitis ([place])   [facility], Megan P, DO   10 months ago Simple chronic bronchitis [facility])   [place], [place], DO

## 2021-01-19 DEATH — deceased

## 2021-01-29 ENCOUNTER — Telehealth: Payer: Self-pay

## 2021-02-07 NOTE — Addendum Note (Signed)
Addended by: Valerie Roys on: 02/07/2021 12:04 PM   Modules accepted: Level of Service

## 2021-06-07 IMAGING — CT CT HEAD W/O CM
3 of 6 series · 14 of 47 positions shown, 16 images · non-contrast
Comparison: None.

CLINICAL DATA: Mental status change

EXAM:
CT HEAD WITHOUT CONTRAST
TECHNIQUE: Contiguous axial images were obtained from the base of the skull
through the vertex without intravenous contrast.

[Series 2: head wo · axial · 0.41mm/px · z∈[+962,+1082]mm · 8 of 30 slices shown, 10 images]
[im 4/30  brain]
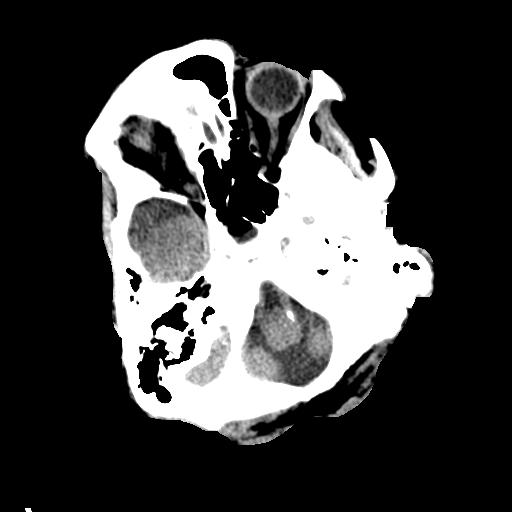
[im 4/30  bone]
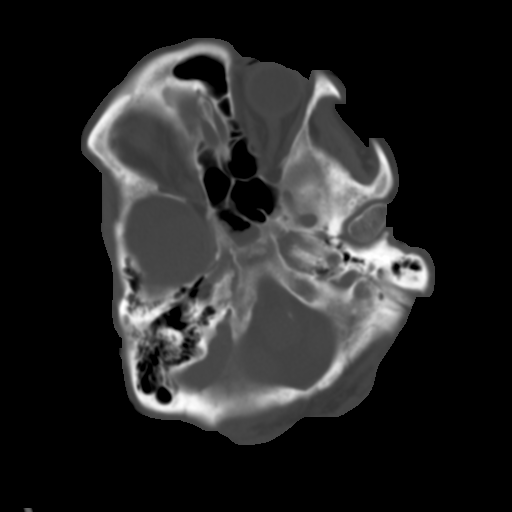
[im 7/30  brain]
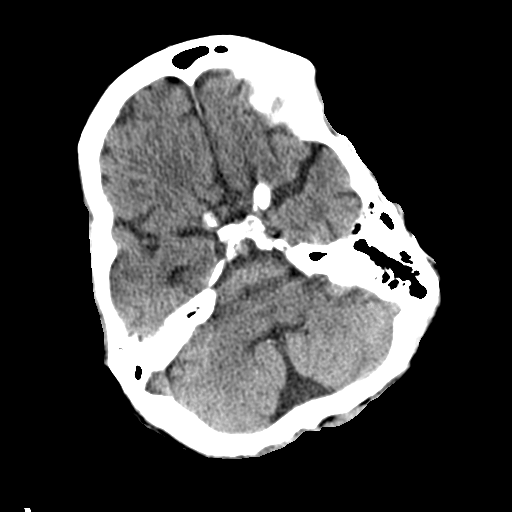
[im 11/30  brain]
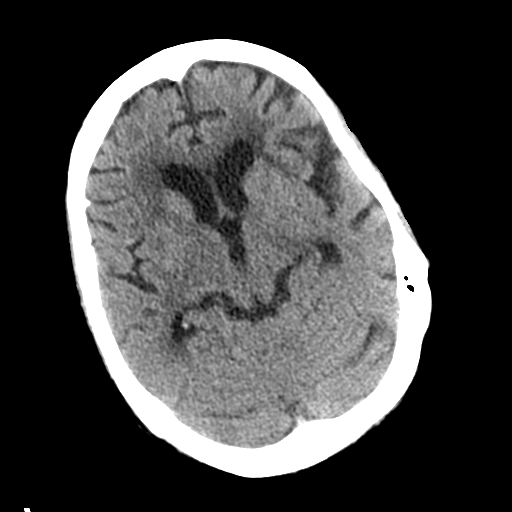
[im 14/30  brain]
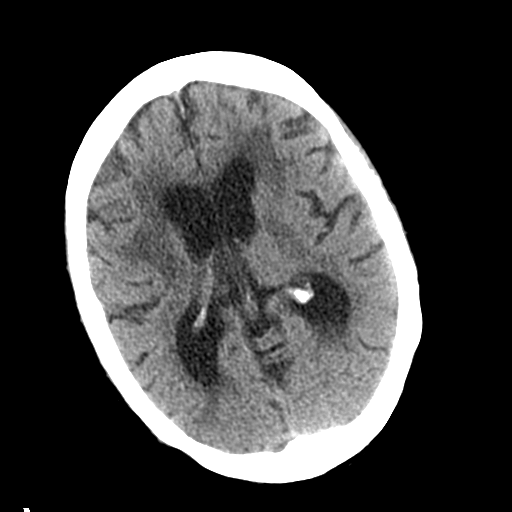
[im 18/30  brain]
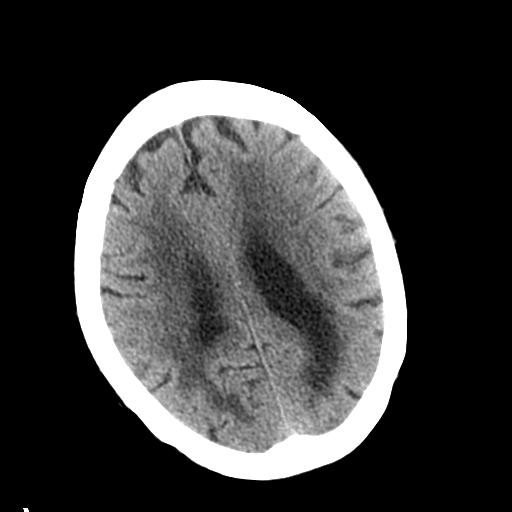
[im 18/30  bone]
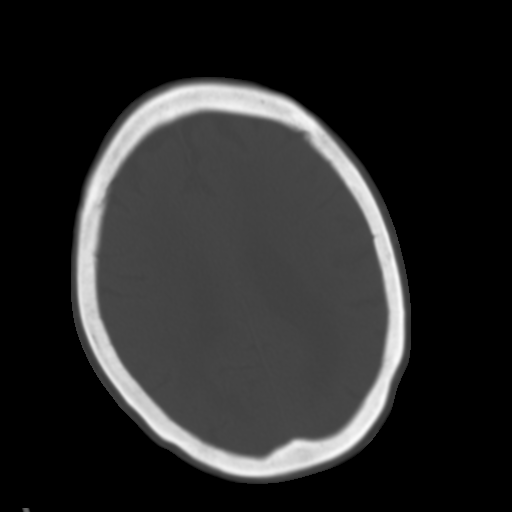
[im 21/30  brain]
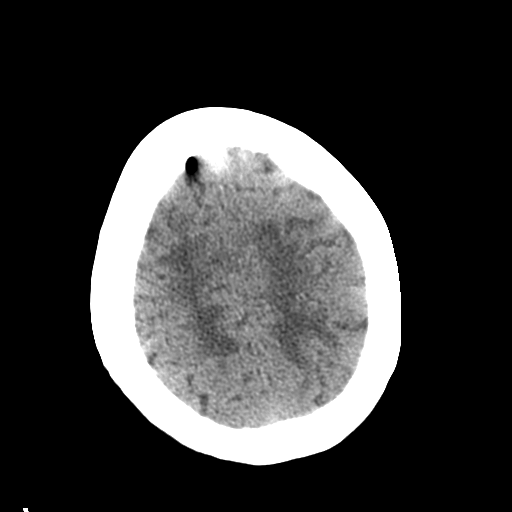
[im 24/30  brain]
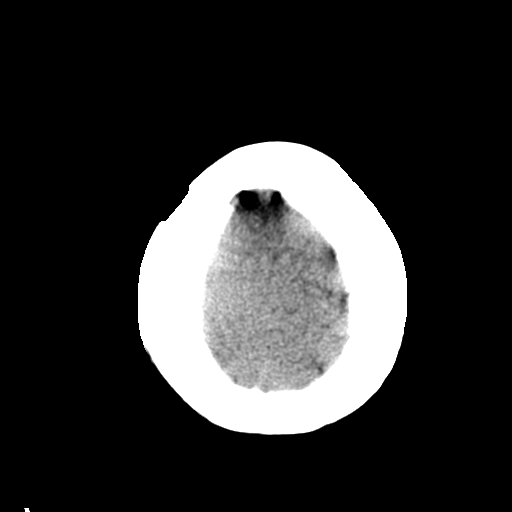
[im 28/30  brain]
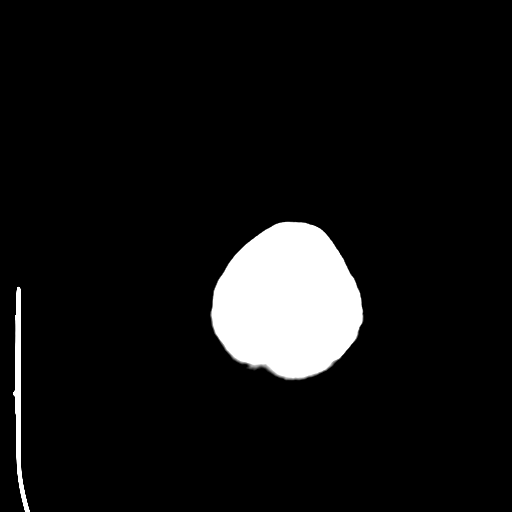

[Series 5: sagittal soft tissue · sagittal · 0.31mm/px · 3 of 50 slices shown]
[im 11/50  brain]
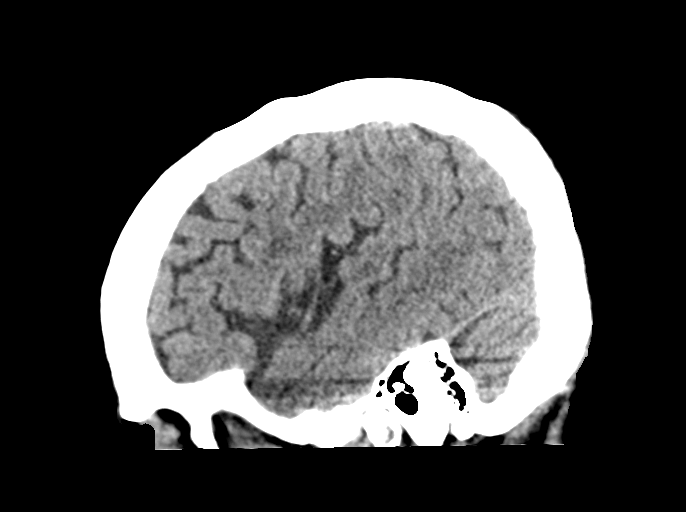
[im 24/50  brain]
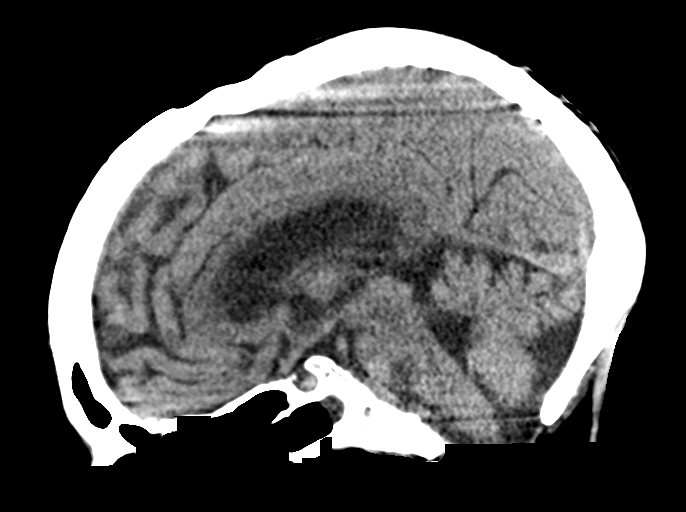
[im 37/50  brain]
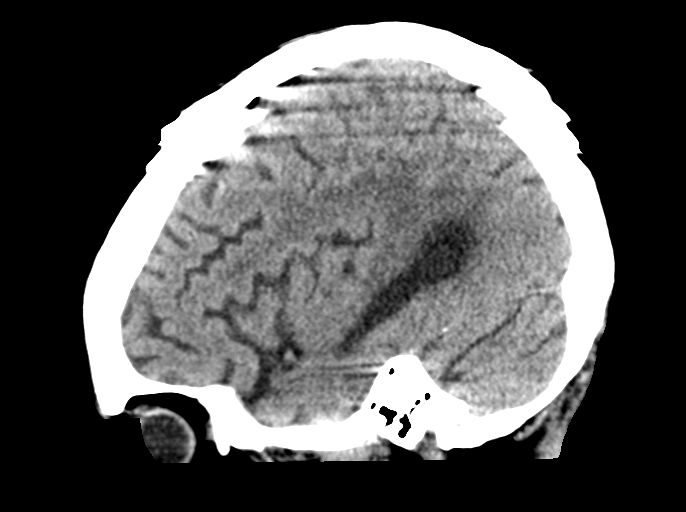

[Series 8: coronal soft tissue · coronal · 0.22mm/px · 3 of 61 slices shown]
[im 16/61  brain]
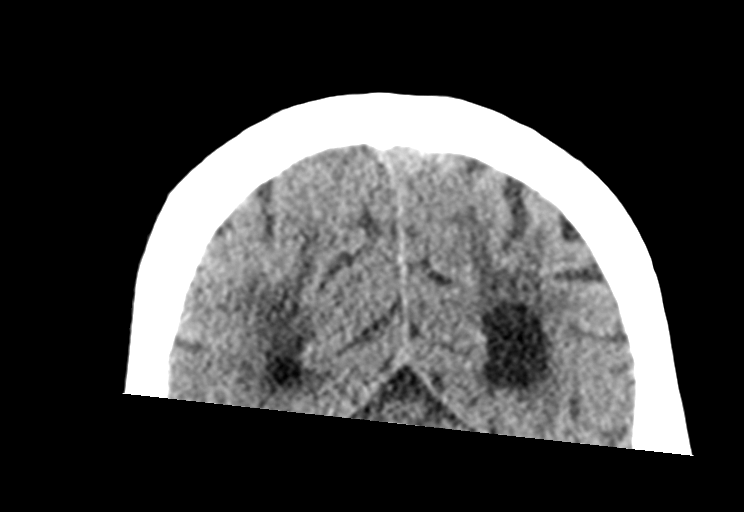
[im 31/61  brain]
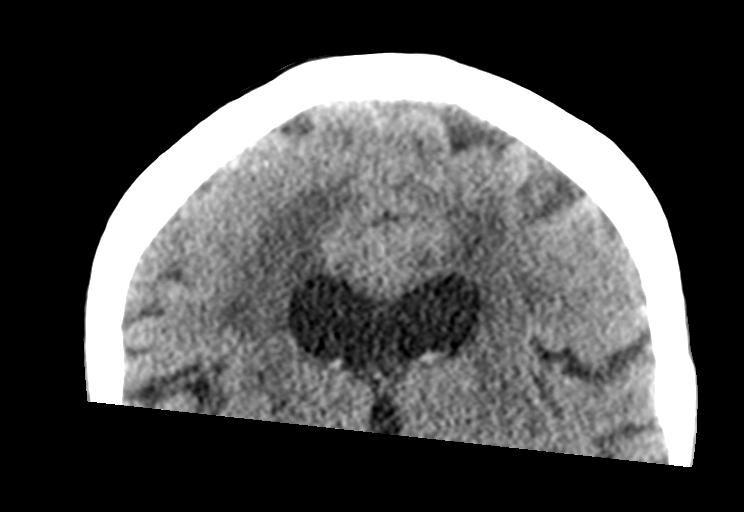
[im 46/61  brain]
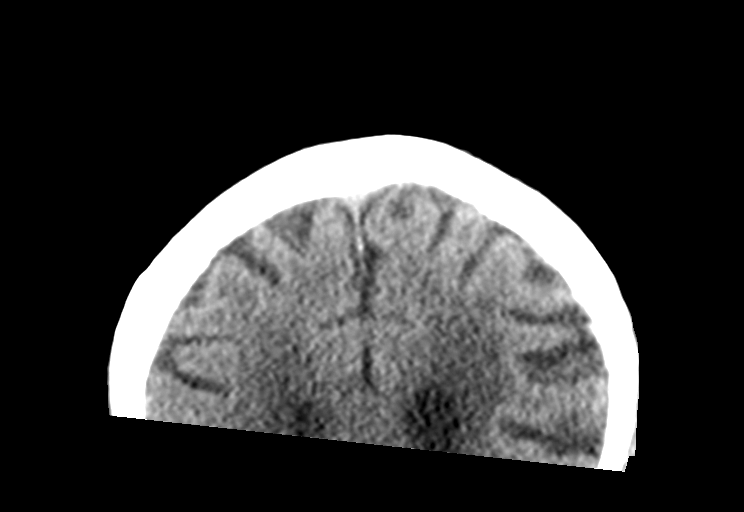

[14 of 47 positions shown; findings below may reference images not displayed]

FINDINGS: Brain: No evidence of acute territorial infarction, hemorrhage,
hydrocephalus,extra-axial collection or mass lesion/mass effect.
There is dilatation the ventricles and sulci consistent with
age-related atrophy. Low-attenuation changes in the deep white
matter consistent with small vessel ischemia.

Vascular: No hyperdense vessel or unexpected calcification.

Skull: The skull is intact. No fracture or focal lesion identified.

Sinuses/Orbits: The visualized paranasal sinuses and mastoid air
cells are clear. The orbits and globes intact.

Other: None
IMPRESSION: No acute intracranial abnormality.

Findings consistent with age related atrophy and chronic small
vessel ischemia

## 2021-06-07 IMAGING — CT CT RENAL STONE PROTOCOL
3 of 4 series · 8 of 46 positions shown, 15 images · non-contrast
Comparison: October 13, 2020

CLINICAL DATA: Burning with urination.

EXAM:
CT ABDOMEN AND PELVIS WITHOUT CONTRAST
TECHNIQUE: Multidetector CT imaging of the abdomen and pelvis was performed
following the standard protocol without IV contrast.

[Series 4: lung bases · axial · 0.69mm/px · z∈[+533,+593]mm · 4 of 20 slices shown, 9 images]
[im 4/20  soft-tissue]
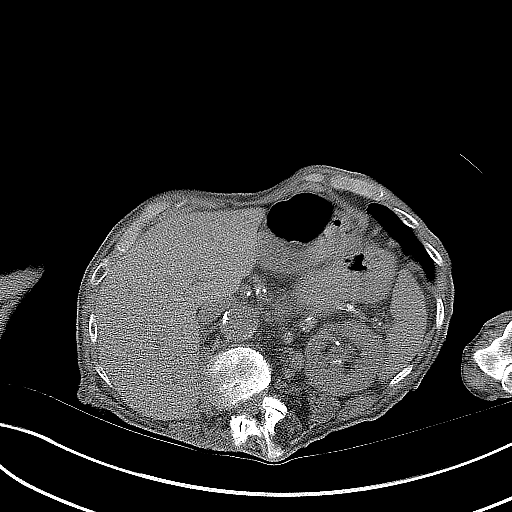
[im 4/20  lung]
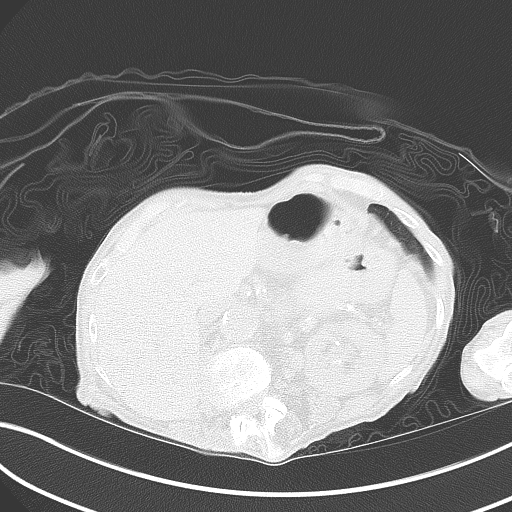
[im 4/20  bone]
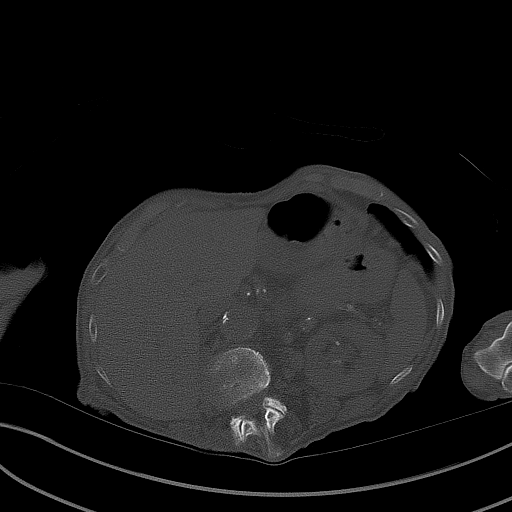
[im 8/20  soft-tissue]
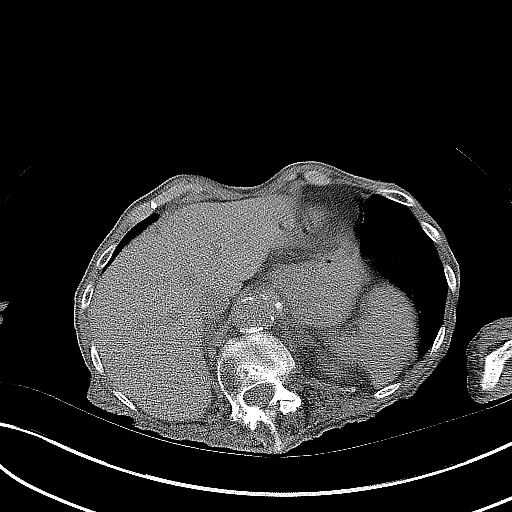
[im 8/20  lung]
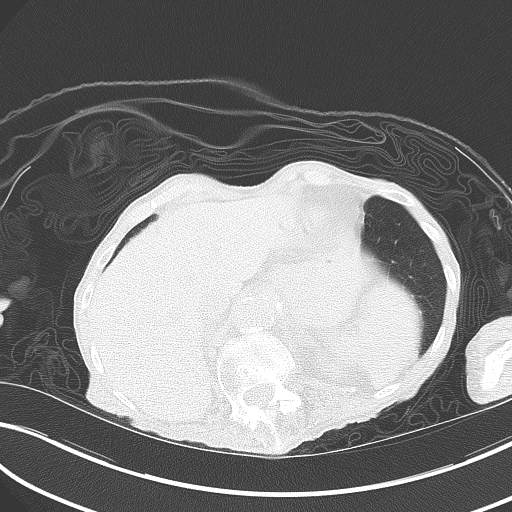
[im 12/20  soft-tissue]
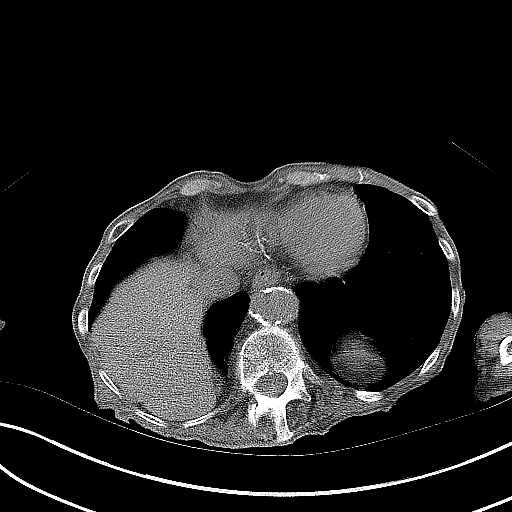
[im 12/20  lung]
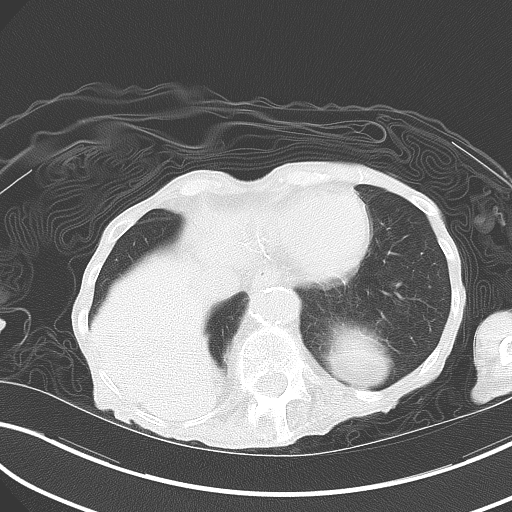
[im 16/20  soft-tissue]
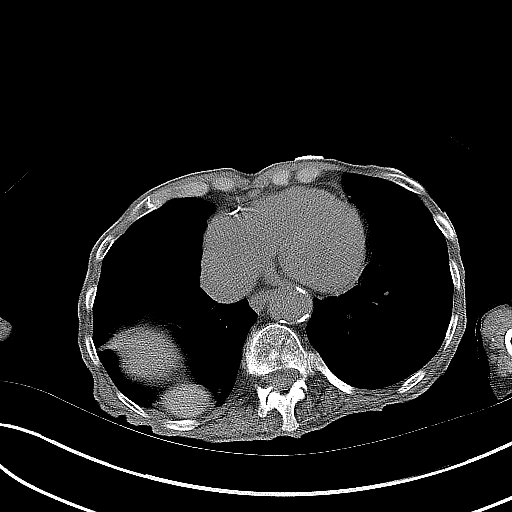
[im 16/20  lung]
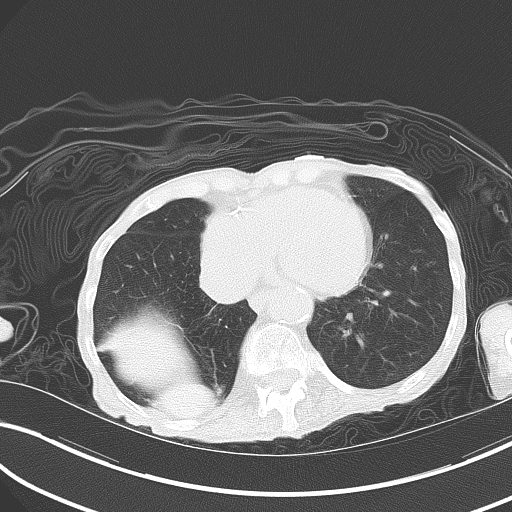

[Series 5: coronal · coronal · 0.81mm/px · 3 of 97 slices shown, 4 images]
[im 33/97  soft-tissue]
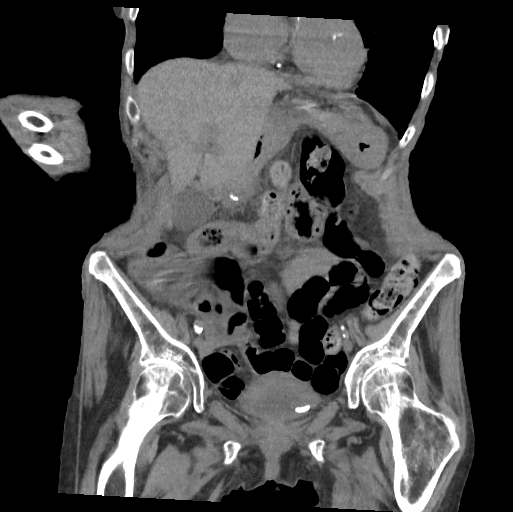
[im 43/97  soft-tissue]
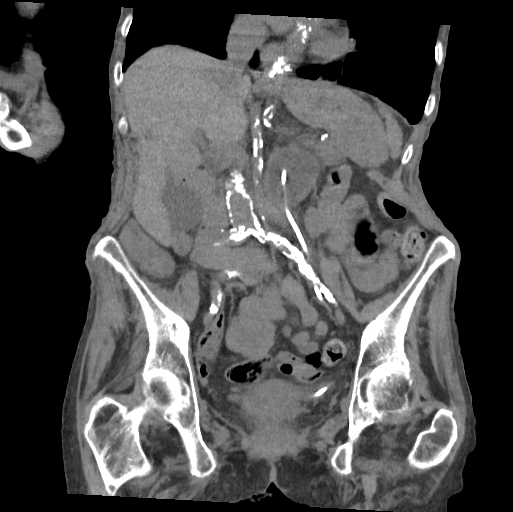
[im 43/97  bone]
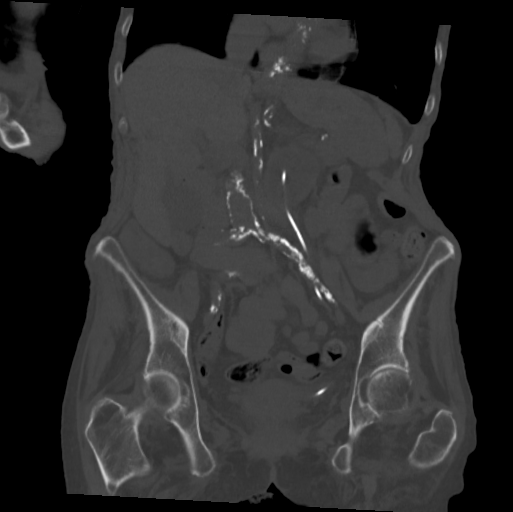
[im 54/97  soft-tissue]
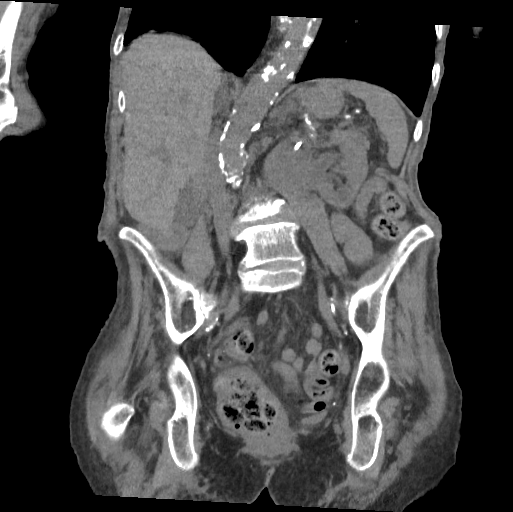

[Series 6: sagittal · sagittal · 0.44mm/px · 1 of 161 slices shown, 2 images]
[im 54/161  soft-tissue]
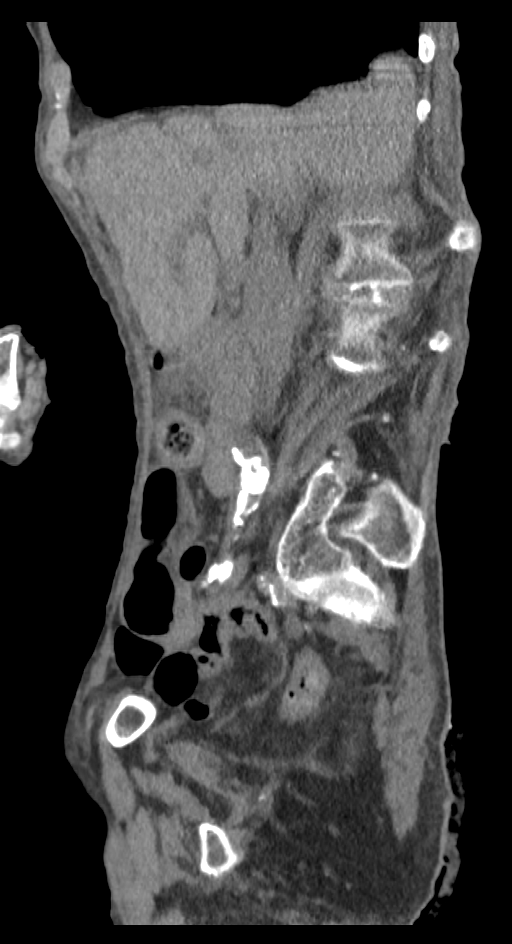
[im 54/161  bone]
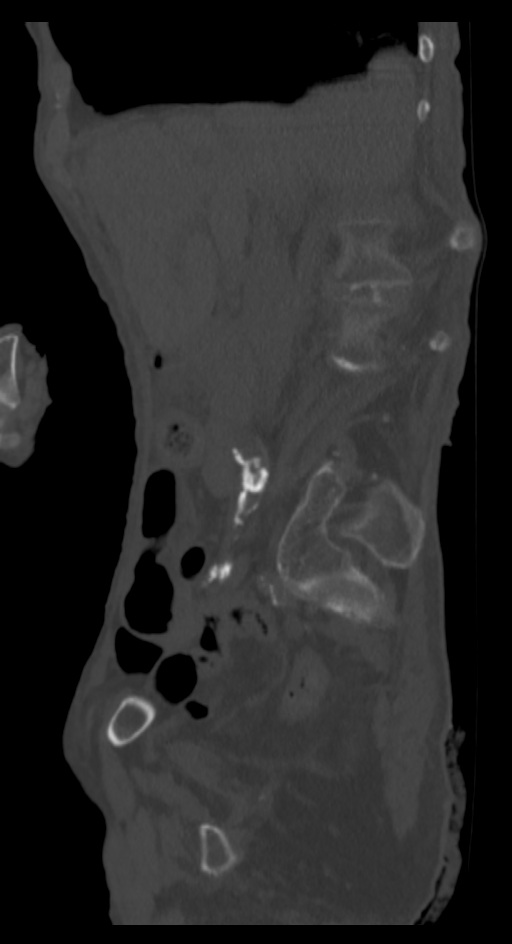

[8 of 46 positions shown; findings below may reference images not displayed]

FINDINGS: Lower chest: Mild atelectasis and/or infiltrate is seen within the
posterior aspect of the right lung base.

Hepatobiliary: No focal liver abnormality is seen. No gallstones,
gallbladder wall thickening, or biliary dilatation. The gallstone
seen within the gallbladder lumen on the prior study is not
visualized on the current exam.

Pancreas: Unremarkable. No pancreatic ductal dilatation or
surrounding inflammatory changes.

Spleen: Normal in size without focal abnormality.

Adrenals/Urinary Tract: Adrenal glands are unremarkable. The right
kidney is absent. The left kidney is normal in size. A left-sided
endo ureteral stent is seen and is unchanged in position. Stable
marked severity left-sided hydronephrosis is also noted. Asymmetric
moderate to marked severity lateral urinary bladder wall thickening
is seen, right greater than left.

Stomach/Bowel: Stomach is within normal limits. The appendix is not
identified. No evidence of bowel wall thickening, distention, or
inflammatory changes.

Vascular/Lymphatic: Aortic atherosclerosis. No enlarged abdominal or
pelvic lymph nodes.

Reproductive: Status post hysterectomy. No adnexal masses.

Other: No abdominal wall hernia or abnormality. The small right
inguinal hernia is seen on the prior study is no longer present. No
abdominopelvic ascites.

Musculoskeletal: Multilevel degenerative changes seen throughout the
lumbar spine. Marked severity dextroscoliosis is also seen.
IMPRESSION: 1. Mild right basilar atelectasis and/or infiltrate.
2. Stable marked severity left-sided hydronephrosis with a
left-sided endo ureteral stent in place.
3. Asymmetric moderate to marked severity urinary bladder wall
thickening which may represent sequelae associated with cystitis.
Correlation with urinalysis is recommended to exclude the presence
of an underlying neoplastic process.
4. Aortic atherosclerosis.

Aortic Atherosclerosis ([id number]).

## 2021-06-09 ENCOUNTER — Ambulatory Visit: Payer: Self-pay | Admitting: Urology

## 2021-11-29 ENCOUNTER — Ambulatory Visit (INDEPENDENT_AMBULATORY_CARE_PROVIDER_SITE_OTHER): Payer: Medicare HMO | Admitting: Vascular Surgery

## 2021-11-29 ENCOUNTER — Encounter (INDEPENDENT_AMBULATORY_CARE_PROVIDER_SITE_OTHER): Payer: Medicare HMO
